# Patient Record
Sex: Male | Born: 1940 | Race: Black or African American | Hispanic: No | Smoking: Former smoker
Health system: Southern US, Community
[De-identification: ages and names within clinical notes are randomized; demographics above are authoritative.]

## PROBLEM LIST (undated history)

## (undated) DIAGNOSIS — I10 Essential (primary) hypertension: Secondary | ICD-10-CM

## (undated) DIAGNOSIS — I4891 Unspecified atrial fibrillation: Secondary | ICD-10-CM

## (undated) DIAGNOSIS — R918 Other nonspecific abnormal finding of lung field: Secondary | ICD-10-CM

## (undated) DIAGNOSIS — F0390 Unspecified dementia without behavioral disturbance: Secondary | ICD-10-CM

## (undated) DIAGNOSIS — E7800 Pure hypercholesterolemia, unspecified: Secondary | ICD-10-CM

## (undated) HISTORY — PX: JOINT REPLACEMENT: SHX530

## (undated) NOTE — *Deleted (*Deleted)
Writer attempted to do medical history for nursing . After a few questions patient became very loud & aggressive so Clinical research associate thanked him & exited the room RN caring for patient informed Patient Lines/Drains/Airways Status    Active Line/Drains/Airways    Name Placement date Placement time Site Days   Peripheral IV 05/08/20 Left;Posterior Forearm 05/08/20  2250  Forearm  7   Incision (Closed) 09/24/19 Abdomen Left;Anterior 09/24/19  2020   234   Pressure Injury 05/19/19 Hip Left;Lower Stage II -  Partial thickness loss of dermis presenting as a shallow open ulcer with a red, pink wound bed without slough. Stage II present with scab over it 05/19/19  0348   362   Pressure Injury 05/19/19 Hip Right;Left Stage I -  Intact skin with non-blanchable redness of a localized area usually over a bony prominence. 05/19/19  0355   362

---
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
## 2016-08-06 ENCOUNTER — Ambulatory Visit: Payer: Medicare HMO | Admitting: Podiatry

## 2016-09-03 ENCOUNTER — Ambulatory Visit: Payer: Medicare HMO | Admitting: Podiatry

## 2016-09-15 ENCOUNTER — Encounter: Payer: Self-pay | Admitting: Podiatry

## 2016-09-15 ENCOUNTER — Ambulatory Visit (INDEPENDENT_AMBULATORY_CARE_PROVIDER_SITE_OTHER): Payer: Medicare HMO | Admitting: Podiatry

## 2016-09-15 DIAGNOSIS — L603 Nail dystrophy: Secondary | ICD-10-CM

## 2016-09-15 DIAGNOSIS — B351 Tinea unguium: Secondary | ICD-10-CM

## 2016-09-15 DIAGNOSIS — L608 Other nail disorders: Secondary | ICD-10-CM | POA: Diagnosis not present

## 2016-09-15 DIAGNOSIS — M79609 Pain in unspecified limb: Secondary | ICD-10-CM

## 2016-09-15 NOTE — Progress Notes (Signed)
   SUBJECTIVE Patient  presents to office today complaining of elongated, thickened nails. Pain while ambulating in shoes. Patient is unable to trim their own nails.   OBJECTIVE General Patient is awake, alert, and oriented x 3 and in no acute distress. Derm Skin is dry and supple bilateral. Negative open lesions or macerations. Remaining integument unremarkable. Nails are tender, long, thickened and dystrophic with subungual debris, consistent with onychomycosis, 1-5 bilateral. No signs of infection noted. Vasc  DP and PT pedal pulses palpable bilaterally. Temperature gradient within normal limits.  Neuro Epicritic and protective threshold sensation diminished bilaterally.  Musculoskeletal Exam No symptomatic pedal deformities noted bilateral. Muscular strength within normal limits.  ASSESSMENT 1. Onychodystrophic nails 1-5 bilateral with hyperkeratosis of nails.  2. Onychomycosis of nail due to dermatophyte bilateral 3. Pain in foot bilateral  PLAN OF CARE 1. Patient evaluated today.  2. Instructed to maintain good pedal hygiene and foot care.  3. Mechanical debridement of nails 1-5 bilaterally performed using a nail nipper. Filed with dremel without incident.  4. Return to clinic in 3 mos.    Jeanetta Alonzo M. Clare Fennimore, DPM [facility]  Dr. Taliana Mersereau M. Mendell Bontempo, DPM    [address]                                        [place], [place] [address]                Office [phone]  Fax [phone]      

## 2016-10-29 ENCOUNTER — Ambulatory Visit (INDEPENDENT_AMBULATORY_CARE_PROVIDER_SITE_OTHER): Payer: Medicare HMO | Admitting: Physician Assistant

## 2016-10-29 ENCOUNTER — Encounter (INDEPENDENT_AMBULATORY_CARE_PROVIDER_SITE_OTHER): Payer: Self-pay | Admitting: Physician Assistant

## 2016-10-29 VITALS — BP 146/83 | HR 90 | Temp 98.2°F | Ht 75.0 in | Wt 169.4 lb

## 2016-10-29 DIAGNOSIS — Z23 Encounter for immunization: Secondary | ICD-10-CM | POA: Diagnosis not present

## 2016-10-29 DIAGNOSIS — I10 Essential (primary) hypertension: Secondary | ICD-10-CM

## 2016-10-29 DIAGNOSIS — M25552 Pain in left hip: Secondary | ICD-10-CM | POA: Diagnosis not present

## 2016-10-29 DIAGNOSIS — F17200 Nicotine dependence, unspecified, uncomplicated: Secondary | ICD-10-CM | POA: Diagnosis not present

## 2016-10-29 DIAGNOSIS — Z1211 Encounter for screening for malignant neoplasm of colon: Secondary | ICD-10-CM

## 2016-10-29 DIAGNOSIS — R69 Illness, unspecified: Secondary | ICD-10-CM | POA: Diagnosis not present

## 2016-10-29 MED ORDER — LOSARTAN POTASSIUM 50 MG PO TABS
50.0000 mg | ORAL_TABLET | Freq: Every day | ORAL | 3 refills | Status: DC
Start: 2016-10-29 — End: 2017-08-07

## 2016-10-29 MED ORDER — PNEUMOCOCCAL 13-VAL CONJ VACC IM SUSP: 0.5000 mL | INTRAMUSCULAR | 0 refills | Status: AC

## 2016-10-29 MED ORDER — HYDROCHLOROTHIAZIDE 12.5 MG PO TABS: 12.5000 mg | ORAL_TABLET | Freq: Every day | ORAL | 3 refills | Status: DC

## 2016-10-29 MED ORDER — MELOXICAM 7.5 MG PO TABS
7.5000 mg | ORAL_TABLET | Freq: Every day | ORAL | 0 refills | Status: DC
Start: 2016-10-29 — End: 2018-03-18

## 2016-10-29 NOTE — Patient Instructions (Signed)
Managing Your Hypertension Hypertension is commonly called high blood pressure. This is when the force of your blood pressing against the walls of your arteries is too strong. Arteries are blood vessels that carry blood from your heart throughout your body. Hypertension forces the heart to work harder to pump blood, and may cause the arteries to become narrow or stiff. Having untreated or uncontrolled hypertension can cause heart attack, stroke, kidney disease, and other problems. What are blood pressure readings? A blood pressure reading consists of a higher number over a lower number. Ideally, your blood pressure should be below 120/80. The first ("top") number is called the systolic pressure. It is a measure of the pressure in your arteries as your heart beats. The second ("bottom") number is called the diastolic pressure. It is a measure of the pressure in your arteries as the heart relaxes. What does my blood pressure reading mean? Blood pressure is classified into four stages. Based on your blood pressure reading, your health care provider may use the following stages to determine what type of treatment you need, if any. Systolic pressure and diastolic pressure are measured in a unit called mm Hg. Normal   Systolic pressure: below 120.  Diastolic pressure: below 80. Elevated   Systolic pressure: 120-129.  Diastolic pressure: below 80. Hypertension stage 1     Diastolic pressure: 80-89. Hypertension stage 2   Systolic pressure: 140 or above.  Diastolic pressure: 90 or above. What health risks are associated with hypertension? Managing your hypertension is an important responsibility. Uncontrolled hypertension can lead to:  A heart attack.  A stroke.  A weakened blood vessel (aneurysm).  Heart failure.  Kidney damage.  Eye damage.  Metabolic syndrome.  Memory and concentration problems. What changes can I make to manage my hypertension? Eating and drinking   Eat a  diet that is high in fiber and potassium, and low in salt (sodium), added sugar, and fat. An example eating plan is called the DASH (Dietary Approaches to Stop Hypertension) diet. To eat this way:  Eat plenty of fresh fruits and vegetables. Try to fill half of your plate at each meal with fruits and vegetables.  Eat whole grains, such as whole wheat pasta, brown rice, or whole grain bread. Fill about one quarter of your plate with whole grains.  Eat low-fat diary products.  Avoid fatty cuts of meat, processed or cured meats, and poultry with skin. Fill about one quarter of your plate with lean proteins such as fish, chicken without skin, beans, eggs, and tofu.  Avoid premade and processed foods. These tend to be higher in sodium, added sugar, and fat.     Lifestyle   Work with your health care provider to maintain a healthy body weight, or to lose weight. Ask what an ideal weight is for you.  Get at least 30 minutes of exercise that causes your heart to beat faster (aerobic exercise) most days of the week. Activities may include walking, swimming, or biking.       Monitoring   Monitor your blood pressure at home as told by your health care provider. Your personal target blood pressure may vary depending on your medical conditions, your age, and other factors.  Have your blood pressure checked regularly, as often as told by your health care provider. Working with your health care provider   Review all the medicines you take with your health care provider because there may be side effects or interactions.  Talk with your health   care provider about your diet, exercise habits, and other lifestyle factors that may be contributing to hypertension.  Visit your health care provider regularly. Your health care provider can help you create and adjust your plan for managing hypertension. Will I need medicine to control my blood pressure? Your health care provider may prescribe medicine if  lifestyle changes are not enough to get your blood pressure under control, and if:  Your systolic blood pressure is 130 or higher.  Your diastolic blood pressure is 80 or higher. Take medicines only as told by your health care provider. Follow the directions carefully. Blood pressure medicines must be taken as prescribed. The medicine does not work as well when you skip doses. Skipping doses also puts you at risk for problems. Contact a health care provider if:  You think you are having a reaction to medicines you have taken.  You have repeated (recurrent) headaches.  You feel dizzy.  You have swelling in your ankles.  You have trouble with your vision. Get help right away if:  You develop a severe headache or confusion.  You have unusual weakness or numbness, or you feel faint.  You have severe pain in your chest or abdomen.  You vomit repeatedly.  You have trouble breathing. Summary  Hypertension is when the force of blood pumping through your arteries is too strong. If this condition is not controlled, it may put you at risk for serious complications.  Your personal target blood pressure may vary depending on your medical conditions, your age, and other factors. For most people, a normal blood pressure is less than 120/80.  Hypertension is managed by lifestyle changes, medicines, or both. Lifestyle changes include weight loss, eating a healthy, low-sodium diet, exercising more, and limiting alcohol. This information is not intended to replace advice given to you by your health care provider. Make sure you discuss any questions you have with your health care provider. Document Released: 02/25/2012 Document Revised: 04/30/2016 Document Reviewed: 04/30/2016 Elsevier Interactive Patient Education  2017 Elsevier Inc.  

## 2016-10-29 NOTE — Progress Notes (Signed)
Subjective:  Patient ID: Richard [name], male    DOB: [birth year]  Age: 76 y.o. MRN: [id number]  CC: physical, left hip pain, HTN  HPI Finas Hellwig is a 76 y.o. male with a PMH of HTN and left hip pain presents for a physical. HTN is not controlled due to not having medications since his recent move here from [place]. Left hip pain is chronic and has had surgical procedure done but does not exactly what. He can ambulate but pain is present.     Smokes half a pack a day "sometimes". Says he is not interested in pharmacotherapy for smoking at this time.   ROS Review of Systems  Constitutional: Negative for chills, fever and malaise/fatigue.  Eyes: Negative for blurred vision.  Respiratory: Negative for shortness of breath.   Cardiovascular: Negative for chest pain and palpitations.  Gastrointestinal: Negative for abdominal pain and nausea.  Genitourinary: Negative for dysuria and hematuria.  Musculoskeletal: Positive for joint pain. Negative for myalgias.  Skin: Negative for rash.  Neurological: Negative for tingling and headaches.  Psychiatric/Behavioral: Negative for depression. The patient is not nervous/anxious.     Objective:  BP (!) 146/83   Pulse 90   Temp 98.2 F (36.8 C) (Oral)   Ht '6\' 3"'$  (1.905 m)   Wt 169 lb 6.4 oz (76.8 kg)   SpO2 95%   BMI 21.17 kg/m   BP/Weight 01/24/9146  Systolic BP 829  Diastolic BP 83  Wt. (Lbs) 169.4  BMI 21.17      Physical Exam  Constitutional: He is oriented to person, place, and time.  Well developed, thin, NAD, polite  HENT:  Head: Normocephalic and atraumatic.  Eyes: No scleral icterus.  Neck: Normal range of motion. Neck supple. No thyromegaly present.  Cardiovascular: Normal rate, regular rhythm and normal heart sounds.   Pulmonary/Chest: Effort normal and breath sounds normal.  Musculoskeletal: He exhibits no edema.  Neurological: He is alert and oriented to person, place, and time. No cranial nerve deficit. Coordination normal.   Skin: Skin is warm and dry. No rash noted. No erythema. No pallor.  Psychiatric: He has a normal mood and affect. His behavior is normal. Thought content normal.  Vitals reviewed.    Assessment & Plan:   1. Hypertension, unspecified type - CBC with Differential - Comprehensive metabolic panel - Lipid Panel  2. Left hip pain - XR HIP UNILAT W OR W/O PELVIS 2-3 VIEWS LEFT  3. Need for Tdap vaccination - TDAP administered in clinic today  4. Need for prophylactic vaccination against Streptococcus pneumoniae (pneumococcus) - Prevnar  5. Encounter for screening colonoscopy - Ambulatory referral to Gastroenterology  6. Tobacco use disorder - Declines pharmacotherapy at this time.   Meds ordered this encounter  Medications  . hydrochlorothiazide (HYDRODIURIL) 12.5 MG tablet    Sig: Take 1 tablet (12.5 mg total) by mouth daily.    Dispense:  90 tablet    Refill:  3    Order Specific Question:   Supervising Provider    Answer:   Tresa Garter [id number]  . losartan (COZAAR) 50 MG tablet    Sig: Take 1 tablet (50 mg total) by mouth daily.    Dispense:  90 tablet    Refill:  3    Order Specific Question:   Supervising Provider    Answer:   Tresa Garter [id number]  . meloxicam (MOBIC) 7.5 MG tablet    Sig: Take 1 tablet (7.5 mg total) by mouth daily.  Dispense:  30 tablet    Refill:  0    Order Specific Question:   Supervising Provider    Answer:   Tresa Garter [id number]    Follow-up: Return in about 4 weeks (around 11/26/2016) for HTN, left hip pain.   Clent Demark PA

## 2016-10-30 ENCOUNTER — Other Ambulatory Visit (INDEPENDENT_AMBULATORY_CARE_PROVIDER_SITE_OTHER): Payer: Self-pay | Admitting: Physician Assistant

## 2016-10-30 DIAGNOSIS — E785 Hyperlipidemia, unspecified: Secondary | ICD-10-CM

## 2016-10-30 LAB — CBC WITH DIFFERENTIAL/PLATELET
Basophils Absolute: 0 10*3/uL (ref 0.0–0.2)
Basos: 1 %
EOS (ABSOLUTE): 0 10*3/uL (ref 0.0–0.4)
Eos: 0 %
Hematocrit: 44.6 % (ref 37.5–51.0)
Hemoglobin: 14.5 g/dL (ref 13.0–17.7)
Immature Grans (Abs): 0 10*3/uL (ref 0.0–0.1)
Immature Granulocytes: 0 %
Lymphocytes Absolute: 3.1 10*3/uL (ref 0.7–3.1)
Lymphs: 42 %
MCH: 21.9 pg — ABNORMAL LOW (ref 26.6–33.0)
MCHC: 32.5 g/dL (ref 31.5–35.7)
MCV: 67 fL — ABNORMAL LOW (ref 79–97)
Monocytes Absolute: 0.7 10*3/uL (ref 0.1–0.9)
Monocytes: 9 %
Neutrophils Absolute: 3.5 10*3/uL (ref 1.4–7.0)
Neutrophils: 48 %
Platelets: 278 10*3/uL (ref 150–379)
RBC: 6.62 x10E6/uL — ABNORMAL HIGH (ref 4.14–5.80)
RDW: 19.9 % — ABNORMAL HIGH (ref 12.3–15.4)
WBC: 7.3 10*3/uL (ref 3.4–10.8)

## 2016-10-30 LAB — COMPREHENSIVE METABOLIC PANEL
ALT: 10 IU/L (ref 0–44)
AST: 17 IU/L (ref 0–40)
Albumin/Globulin Ratio: 1.4 (ref 1.2–2.2)
Albumin: 4.5 g/dL (ref 3.5–4.8)
Alkaline Phosphatase: 77 IU/L (ref 39–117)
BUN/Creatinine Ratio: 13 (ref 10–24)
BUN: 12 mg/dL (ref 8–27)
Bilirubin Total: 0.4 mg/dL (ref 0.0–1.2)
CO2: 25 mmol/L (ref 18–29)
Calcium: 10.4 mg/dL — ABNORMAL HIGH (ref 8.6–10.2)
Chloride: 106 mmol/L (ref 96–106)
Creatinine, Ser: 0.93 mg/dL (ref 0.76–1.27)
GFR calc Af Amer: 93 mL/min/{1.73_m2} (ref >59)
GFR calc non Af Amer: 81 mL/min/{1.73_m2} (ref >59)
Globulin, Total: 3.2 g/dL (ref 1.5–4.5)
Glucose: 107 mg/dL — ABNORMAL HIGH (ref 65–99)
Potassium: 4.6 mmol/L (ref 3.5–5.2)
Sodium: 145 mmol/L — ABNORMAL HIGH (ref 134–144)
Total Protein: 7.7 g/dL (ref 6.0–8.5)

## 2016-10-30 LAB — LIPID PANEL
Chol/HDL Ratio: 4.1 ratio (ref 0.0–5.0)
Cholesterol, Total: 252 mg/dL — ABNORMAL HIGH (ref 100–199)
HDL: 62 mg/dL (ref >39)
LDL Calculated: 152 mg/dL — ABNORMAL HIGH (ref 0–99)
Triglycerides: 188 mg/dL — ABNORMAL HIGH (ref 0–149)
VLDL Cholesterol Cal: 38 mg/dL (ref 5–40)

## 2016-10-30 MED ORDER — ATORVASTATIN CALCIUM 20 MG PO TABS: 20.0000 mg | ORAL_TABLET | Freq: Every evening | ORAL | 1 refills | Status: DC

## 2016-10-30 NOTE — Progress Notes (Signed)
Elevated LDL.

## 2016-11-17 ENCOUNTER — Ambulatory Visit: Payer: Medicare HMO | Admitting: Podiatry

## 2016-11-26 ENCOUNTER — Ambulatory Visit (HOSPITAL_COMMUNITY)
Admission: EM | Admit: 2016-11-26 | Discharge: 2016-11-26 | Disposition: A | Discharge status: Home / Self Care | Payer: Medicare HMO | Attending: Family Medicine | Admitting: Family Medicine

## 2016-11-26 ENCOUNTER — Ambulatory Visit (INDEPENDENT_AMBULATORY_CARE_PROVIDER_SITE_OTHER): Payer: Medicare HMO | Admitting: Physician Assistant

## 2016-11-26 ENCOUNTER — Encounter (HOSPITAL_COMMUNITY): Payer: Self-pay | Admitting: Emergency Medicine

## 2016-11-26 DIAGNOSIS — M26622 Arthralgia of left temporomandibular joint: Secondary | ICD-10-CM | POA: Diagnosis not present

## 2016-11-26 DIAGNOSIS — R6884 Jaw pain: Secondary | ICD-10-CM

## 2016-11-26 HISTORY — DX: Essential (primary) hypertension: I10

## 2016-11-26 HISTORY — DX: Pure hypercholesterolemia, unspecified: E78.00

## 2016-11-26 MED ORDER — ACETAMINOPHEN 500 MG PO TABS: 1000.0000 mg | ORAL_TABLET | Freq: Three times a day (TID) | ORAL | 0 refills | Status: DC | PRN

## 2016-11-26 NOTE — ED Triage Notes (Signed)
Pt reports left sided jaw pain that started three days ago.  Pt has no teeth.  Pt's daughter gave him some Tylenol yesterday with very little relief.

## 2016-11-26 NOTE — Discharge Instructions (Signed)
Please return to Urgent Care if worsening or new and concerning symptoms develop such as increasing pain, fever, difficulty swallowing.

## 2016-11-27 NOTE — ED Provider Notes (Signed)
[facility]    CSN: [id number] Arrival date & time: 11/26/16  1035     History   Chief Complaint Chief Complaint  Patient presents with  . Jaw Pain    left    HPI Richard [name] is a 76 y.o. male. Presents with gradual onset of L-sided "jaw pain". No injury. Able to eat and drink normally. Pain slightly worse when opening mouth fully. Afebrile. No mouth lesions or sores. No neck pain or swelling. No teeth. Has not worn dentures in over two years. Tylenol without much help. Single dose yesterday. Able to sleep normally.  HPI  Past Medical History:  Diagnosis Date  . High cholesterol   . Hypertension     There are no active problems to display for this patient.   Past Surgical History:  Procedure Laterality Date  . JOINT REPLACEMENT        Home Medications    Prior to Admission medications   Medication Sig Start Date End Date Taking? Authorizing Provider  ASPIRIN 81 PO Take by mouth.   Yes [provider]  atorvastatin (LIPITOR) 20 MG tablet Take 1 tablet (20 mg total) by mouth every evening. 10/30/16  Yes Clent Demark, PA-C  hydrochlorothiazide (HYDRODIURIL) 12.5 MG tablet Take 1 tablet (12.5 mg total) by mouth daily. 10/29/16  Yes Clent Demark, PA-C  losartan (COZAAR) 50 MG tablet Take 1 tablet (50 mg total) by mouth daily. 10/29/16  Yes Clent Demark, PA-C  meloxicam (MOBIC) 7.5 MG tablet Take 1 tablet (7.5 mg total) by mouth daily. 10/29/16  Yes Clent Demark, PA-C  acetaminophen (TYLENOL) 500 MG tablet Take 2 tablets (1,000 mg total) by mouth every 8 (eight) hours as needed for mild pain. 11/26/16   Vanessa Kick, MD    Family History History reviewed. No pertinent family history.  Social History Social History  Substance Use Topics  . Smoking status: Current Every Day Smoker    Packs/day: 0.25    Types: Cigarettes  . Smokeless tobacco: Never Used  . Alcohol use Yes     Comment: occasional     Allergies   Patient  has no known allergies.   Review of Systems Review of Systems  Constitutional: Negative for activity change, appetite change, chills, fatigue, fever and unexpected weight change.  HENT: Negative for ear pain, facial swelling, sinus pain and sinus pressure.   Eyes: Negative for pain.  Musculoskeletal: Negative for arthralgias and myalgias.  Skin: Negative for color change and rash.  No neck pain.  Physical Exam Triage Vital Signs ED Triage Vitals  Enc Vitals Group     BP 11/26/16 1111 119/86     Pulse Rate 11/26/16 1111 70     Resp --      Temp 11/26/16 1111 98.2 F (36.8 C)     Temp Source 11/26/16 1111 Oral     SpO2 11/26/16 1111 98 %     Weight --      Height --      Head Circumference --      Peak Flow --      Pain Score 11/26/16 1112 5     Pain Loc --      Pain Edu? --      Excl. in [place]? --    No data found.   Updated Vital Signs BP 119/86 (BP Location: Right Arm)   Pulse 70   Temp 98.2 F (36.8 C) (Oral)   SpO2 98%  Physical Exam  Constitutional: He appears well-developed and well-nourished. No distress.  HENT:  Head: Normocephalic and atraumatic.  Skin: He is not diaphoretic.   Very tender over L TMJ. No swelling. No oral abscess or infection appreciated. All teeth missing. Neck with FROM and supple. No LAD.  UC Treatments / Results  Labs (all labs ordered are listed, but only abnormal results are displayed) Labs Reviewed - No data to display  EKG  EKG Interpretation None       Radiology No results found.  Procedures Procedures (including critical care time)  Medications Ordered in UC Medications - No data to display   Initial Impression / Assessment and Plan / UC Course  I have reviewed the triage vital signs and the nursing notes.  Pertinent labs & imaging results that were available during my care of the patient were reviewed by me and considered in my medical decision making (see chart for details).       Final Clinical  Impressions(s) / UC Diagnoses   Final diagnoses:  Arthralgia of left temporomandibular joint   Continue regular Tylenol in addition to the Mobic he is taking. If not improving in 48 hours or if worsening he is instructed to RTC.  New Prescriptions Discharge Medication List as of 11/26/2016 11:56 AM    START taking these medications   Details  acetaminophen (TYLENOL) 500 MG tablet Take 2 tablets (1,000 mg total) by mouth every 8 (eight) hours as needed for mild pain., Starting Wed 11/26/2016, Normal         Vanessa Kick, MD 11/27/16 1008

## 2016-11-28 DIAGNOSIS — Z6823 Body mass index (BMI) 23.0-23.9, adult: Secondary | ICD-10-CM | POA: Diagnosis not present

## 2016-11-28 DIAGNOSIS — I10 Essential (primary) hypertension: Secondary | ICD-10-CM | POA: Diagnosis not present

## 2016-11-28 DIAGNOSIS — Z79899 Other long term (current) drug therapy: Secondary | ICD-10-CM | POA: Diagnosis not present

## 2016-11-28 DIAGNOSIS — Z7982 Long term (current) use of aspirin: Secondary | ICD-10-CM | POA: Diagnosis not present

## 2016-11-28 DIAGNOSIS — M13152 Monoarthritis, not elsewhere classified, left hip: Secondary | ICD-10-CM | POA: Diagnosis not present

## 2016-11-28 DIAGNOSIS — Z0000 Encounter for general adult medical examination without abnormal findings: Secondary | ICD-10-CM | POA: Diagnosis not present

## 2016-11-28 DIAGNOSIS — K08109 Complete loss of teeth, unspecified cause, unspecified class: Secondary | ICD-10-CM | POA: Diagnosis not present

## 2016-11-28 DIAGNOSIS — E785 Hyperlipidemia, unspecified: Secondary | ICD-10-CM | POA: Diagnosis not present

## 2016-11-28 DIAGNOSIS — R69 Illness, unspecified: Secondary | ICD-10-CM | POA: Diagnosis not present

## 2016-11-28 DIAGNOSIS — G3184 Mild cognitive impairment, so stated: Secondary | ICD-10-CM | POA: Diagnosis not present

## 2017-08-07 ENCOUNTER — Other Ambulatory Visit (INDEPENDENT_AMBULATORY_CARE_PROVIDER_SITE_OTHER): Payer: Self-pay | Admitting: Physician Assistant

## 2017-08-17 NOTE — Telephone Encounter (Signed)
FWD to PCP. Tempestt S Roberts, CMA  

## 2018-03-18 ENCOUNTER — Ambulatory Visit (INDEPENDENT_AMBULATORY_CARE_PROVIDER_SITE_OTHER): Payer: Medicare HMO | Admitting: Physician Assistant

## 2018-03-18 ENCOUNTER — Encounter (INDEPENDENT_AMBULATORY_CARE_PROVIDER_SITE_OTHER): Payer: Self-pay | Admitting: Physician Assistant

## 2018-03-18 ENCOUNTER — Other Ambulatory Visit: Payer: Self-pay

## 2018-03-18 VITALS — BP 151/91 | HR 86 | Temp 98.0°F | Ht 75.0 in | Wt 173.8 lb

## 2018-03-18 DIAGNOSIS — M25511 Pain in right shoulder: Secondary | ICD-10-CM

## 2018-03-18 DIAGNOSIS — I10 Essential (primary) hypertension: Secondary | ICD-10-CM | POA: Diagnosis not present

## 2018-03-18 DIAGNOSIS — E785 Hyperlipidemia, unspecified: Secondary | ICD-10-CM | POA: Diagnosis not present

## 2018-03-18 DIAGNOSIS — Z23 Encounter for immunization: Secondary | ICD-10-CM

## 2018-03-18 DIAGNOSIS — G8929 Other chronic pain: Secondary | ICD-10-CM

## 2018-03-18 DIAGNOSIS — M25512 Pain in left shoulder: Secondary | ICD-10-CM | POA: Diagnosis not present

## 2018-03-18 MED ORDER — HYDROCHLOROTHIAZIDE 12.5 MG PO TABS: 12.5000 mg | ORAL_TABLET | Freq: Every day | ORAL | 3 refills | Status: DC

## 2018-03-18 MED ORDER — ATORVASTATIN CALCIUM 20 MG PO TABS: 20.0000 mg | ORAL_TABLET | Freq: Every evening | ORAL | 1 refills | Status: DC

## 2018-03-18 MED ORDER — MELOXICAM 7.5 MG PO TABS: 7.5000 mg | ORAL_TABLET | Freq: Every day | ORAL | 0 refills | Status: DC

## 2018-03-18 MED ORDER — LOSARTAN POTASSIUM 50 MG PO TABS: 50.0000 mg | ORAL_TABLET | Freq: Every day | ORAL | 3 refills | Status: DC

## 2018-03-18 MED ORDER — ASPIRIN 81 MG PO TBEC: 81.0000 mg | DELAYED_RELEASE_TABLET | Freq: Every day | ORAL | 3 refills | Status: DC

## 2018-03-18 NOTE — Progress Notes (Signed)
Subjective:  Patient ID: Richard [name], male    DOB: February 26, 1941  Age: 77 y.o. MRN: [id number]  CC: HTN and med refill  HPI Richard [name] is a 77 y.o. male with a PMH of HTN, HLD, and left hip pain presents to f/u on HTN. Last BP 146/83 mmHg 18 weeks ago. Had not been taking anti-hypertensives due to moving from [place]. HCTZ 12.5 mg and Losartan 50 mg were refilled. However, his pharmacy here in [place] closed and he was not able to fill his medications. BP 151/91 mmHg today. Does not endorse CP, palpitations, SOB, HA, tingling, numbness, abdominal pain, f/c/n/v, rash, swelling, or GI/GU sxs.     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 Feels as though he has to move and stretch his arms to relieve his shoulder pain/stiffness. Does not endorse any other associated symptoms. Daughter accompanies patient and states he is very inactive and just "sits there" all day. She believes he has to go out and be more active to reduce his pain and stiffness.     Outpatient Medications Prior to Visit  Medication Sig Dispense Refill  . acetaminophen (TYLENOL) 500 MG tablet Take 2 tablets (1,000 mg total) by mouth every 8 (eight) hours as needed for mild pain. (Patient not taking: Reported on 03/18/2018) 20 tablet 0  . ASPIRIN 81 PO Take by mouth.    Marland Kitchen atorvastatin (LIPITOR) 20 MG tablet Take 1 tablet (20 mg total) by mouth every evening. (Patient not taking: Reported on 03/18/2018) 90 tablet 1  . hydrochlorothiazide (HYDRODIURIL) 12.5 MG tablet Take 1 tablet (12.5 mg total) by mouth daily. (Patient not taking: Reported on 03/18/2018) 90 tablet 3  . losartan (COZAAR) 50 MG tablet Take 1 tablet (50 mg total) by mouth daily. (Patient not taking: Reported on 03/18/2018) 90 tablet 3  . meloxicam (MOBIC) 7.5 MG tablet Take 1 tablet (7.5 mg total) by mouth daily. (Patient not taking: Reported on 03/18/2018) 30 tablet 0   No facility-administered medications prior to visit.       ROS Review of Systems  Constitutional: Negative for chills, fever and malaise/fatigue.  Eyes: Negative for blurred vision.  Respiratory: Negative for shortness of breath.   Cardiovascular: Negative for chest pain and palpitations.  Gastrointestinal: Negative for abdominal pain and nausea.  Genitourinary: Negative for dysuria and hematuria.  Musculoskeletal: Positive for joint pain. Negative for myalgias.  Skin: Negative for rash.  Neurological: Negative for tingling and headaches.  Psychiatric/Behavioral: Negative for depression. The patient is not nervous/anxious.     Objective:  BP (!) 151/91 (BP Location: Right Arm, Patient Position: Sitting, Cuff Size: Normal)   Pulse 86   Temp 98 F (36.7 C) (Oral)   Ht 6\' 3"  (1.905 m)   Wt 173 lb 12.8 oz (78.8 kg)   SpO2 96%   BMI 21.72 kg/m   BP/Weight 03/18/2018 11/26/2016 09/22/8117  Systolic BP 147 829 562  Diastolic BP 91 86 83  Wt. (Lbs) 173.8 - 169.4  BMI 21.72 - 21.17      Physical Exam  Constitutional: He is oriented to person, place, and time.  Well developed, well nourished, NAD, polite  HENT:  Head: Normocephalic and atraumatic.  Eyes: No scleral icterus.  Neck: Normal range of motion. Neck supple. No thyromegaly present.  Cardiovascular: Normal rate, regular rhythm and normal heart sounds.  Pulmonary/Chest: Effort normal and breath sounds normal.  Abdominal: Soft. Bowel sounds are normal. There is no tenderness.  Musculoskeletal:  He exhibits no edema.  Bilateral shoulder with mildly limited flexion and abduction. Right shoulder with mildly limited internal rotation compared to left shoulder. AC shear testing negative bilaterally.Negative drop arm test bilaterally.  Neurological: He is alert and oriented to person, place, and time.  Bilateral shoulder with strength 5/5  Skin: Skin is warm and dry. No rash noted. No erythema. No pallor.  Psychiatric: He has a normal mood and affect. His behavior is normal.  Thought content normal.  Vitals reviewed.    Assessment & Plan:   1. Hypertension, unspecified type - Refill losartan (COZAAR) 50 MG tablet; Take 1 tablet (50 mg total) by mouth daily.  Dispense: 90 tablet; Refill: 3 - Refill hydrochlorothiazide (HYDRODIURIL) 12.5 MG tablet; Take 1 tablet (12.5 mg total) by mouth daily.  Dispense: 90 tablet; Refill: 3 - Refill aspirin (ASPIRIN 81) 81 MG EC tablet; Take 1 tablet (81 mg total) by mouth daily.  Dispense: 90 tablet; Refill: 3  2. Dyslipidemia - Refill atorvastatin (LIPITOR) 20 MG tablet; Take 1 tablet (20 mg total) by mouth every evening.  Dispense: 90 tablet; Refill: 1  3. Chronic pain of both shoulders - Ambulatory referral to Physical Therapy - Refill meloxicam (MOBIC) 7.5 MG tablet; Take 1 tablet (7.5 mg total) by mouth daily.  Dispense: 30 tablet; Refill: 0  4. Need for prophylactic vaccination and inoculation against influenza - Flu Vaccine QUAD 6+ mos PF IM (Fluarix Quad PF)   Meds ordered this encounter  Medications  . DISCONTD: losartan (COZAAR) 50 MG tablet    Sig: Take 1 tablet (50 mg total) by mouth daily.    Dispense:  90 tablet    Refill:  3    This prescription was filled on 08/07/2017. Any refills authorized will be placed on file.    Order Specific Question:   Supervising Provider    Answer:   Charlott Rakes [[id number]]  . DISCONTD: hydrochlorothiazide (HYDRODIURIL) 12.5 MG tablet    Sig: Take 1 tablet (12.5 mg total) by mouth daily.    Dispense:  90 tablet    Refill:  3    This prescription was filled on 08/07/2017. Any refills authorized will be placed on file.    Order Specific Question:   Supervising Provider    Answer:   Charlott Rakes [[id number]]  . DISCONTD: atorvastatin (LIPITOR) 20 MG tablet    Sig: Take 1 tablet (20 mg total) by mouth every evening.    Dispense:  90 tablet    Refill:  1    Order Specific Question:   Supervising Provider    Answer:   Charlott Rakes [[id number]]  . DISCONTD: aspirin (ASPIRIN 81) 81  MG EC tablet    Sig: Take 1 tablet (81 mg total) by mouth daily.    Dispense:  90 tablet    Refill:  3    Order Specific Question:   Supervising Provider    Answer:   Charlott Rakes [[id number]]  . DISCONTD: meloxicam (MOBIC) 7.5 MG tablet    Sig: Take 1 tablet (7.5 mg total) by mouth daily.    Dispense:  30 tablet    Refill:  0    Order Specific Question:   Supervising Provider    Answer:   Charlott Rakes [[id number]]  . meloxicam (MOBIC) 7.5 MG tablet    Sig: Take 1 tablet (7.5 mg total) by mouth daily.    Dispense:  30 tablet    Refill:  0    Order Specific Question:   Supervising  Provider    Answer:   Charlott Rakes [[id number]]  . losartan (COZAAR) 50 MG tablet    Sig: Take 1 tablet (50 mg total) by mouth daily.    Dispense:  90 tablet    Refill:  3    This prescription was filled on 08/07/2017. Any refills authorized will be placed on file.    Order Specific Question:   Supervising Provider    Answer:   Charlott Rakes [[id number]]  . hydrochlorothiazide (HYDRODIURIL) 12.5 MG tablet    Sig: Take 1 tablet (12.5 mg total) by mouth daily.    Dispense:  90 tablet    Refill:  3    This prescription was filled on 08/07/2017. Any refills authorized will be placed on file.    Order Specific Question:   Supervising Provider    Answer:   Charlott Rakes [[id number]]  . atorvastatin (LIPITOR) 20 MG tablet    Sig: Take 1 tablet (20 mg total) by mouth every evening.    Dispense:  90 tablet    Refill:  1    Order Specific Question:   Supervising Provider    Answer:   Charlott Rakes [[id number]]  . aspirin (ASPIRIN 81) 81 MG EC tablet    Sig: Take 1 tablet (81 mg total) by mouth daily.    Dispense:  90 tablet    Refill:  3    Order Specific Question:   Supervising Provider    Answer:   Charlott Rakes [[id number]]    Follow-up: Return in about 8 weeks (around 05/13/2018) for HTN and shoulder pain.   Clent Demark PA

## 2018-03-18 NOTE — Patient Instructions (Signed)

## 2018-04-15 ENCOUNTER — Ambulatory Visit (INDEPENDENT_AMBULATORY_CARE_PROVIDER_SITE_OTHER): Payer: Medicare HMO | Admitting: Physician Assistant

## 2018-05-17 ENCOUNTER — Ambulatory Visit (INDEPENDENT_AMBULATORY_CARE_PROVIDER_SITE_OTHER): Payer: Medicare HMO | Admitting: Physician Assistant

## 2018-08-31 ENCOUNTER — Other Ambulatory Visit (INDEPENDENT_AMBULATORY_CARE_PROVIDER_SITE_OTHER): Payer: Self-pay | Admitting: Primary Care

## 2018-08-31 ENCOUNTER — Telehealth (INDEPENDENT_AMBULATORY_CARE_PROVIDER_SITE_OTHER): Payer: Self-pay | Admitting: Physician Assistant

## 2018-08-31 DIAGNOSIS — I10 Essential (primary) hypertension: Secondary | ICD-10-CM

## 2018-08-31 DIAGNOSIS — E785 Hyperlipidemia, unspecified: Secondary | ICD-10-CM

## 2018-08-31 MED ORDER — ATORVASTATIN CALCIUM 20 MG PO TABS: 20.0000 mg | ORAL_TABLET | Freq: Every evening | ORAL | 0 refills | Status: DC

## 2018-08-31 MED ORDER — LOSARTAN POTASSIUM 50 MG PO TABS: 50.0000 mg | ORAL_TABLET | Freq: Every day | ORAL | 0 refills | Status: DC

## 2018-08-31 MED ORDER — HYDROCHLOROTHIAZIDE 12.5 MG PO TABS: 12.5000 mg | ORAL_TABLET | Freq: Every day | ORAL | 0 refills | Status: DC

## 2018-08-31 NOTE — Telephone Encounter (Signed)
Pt will need an OV for further RF's.

## 2018-08-31 NOTE — Telephone Encounter (Signed)
Patient daughter called to request med refill for   atorvastatin (LIPITOR) 20 MG tablet  hydrochlorothiazide (HYDRODIURIL) 12.5 MG tablet   losartan (COZAAR) 50 MG tablet  Patient would like for Rx to go [facility] on [address]  Phone: [phone]  Fax: [phone]   Please advise [phone]   Thank you Emmit Pomfret

## 2018-08-31 NOTE — Telephone Encounter (Signed)
Patient is aware. appt on march 24 @ 2:10 pm

## 2018-09-03 ENCOUNTER — Telehealth (INDEPENDENT_AMBULATORY_CARE_PROVIDER_SITE_OTHER): Payer: Self-pay | Admitting: Physician Assistant

## 2018-09-03 NOTE — Telephone Encounter (Signed)
Patient daughter called to inform that Rx was not sent to [facility].  Please advise [phone]  Thank you Emmit Pomfret

## 2018-09-06 ENCOUNTER — Other Ambulatory Visit (INDEPENDENT_AMBULATORY_CARE_PROVIDER_SITE_OTHER): Payer: Self-pay

## 2018-09-06 ENCOUNTER — Ambulatory Visit (INDEPENDENT_AMBULATORY_CARE_PROVIDER_SITE_OTHER): Payer: Medicare HMO | Admitting: Primary Care

## 2018-09-06 DIAGNOSIS — I10 Essential (primary) hypertension: Secondary | ICD-10-CM

## 2018-09-06 DIAGNOSIS — E785 Hyperlipidemia, unspecified: Secondary | ICD-10-CM

## 2018-09-06 MED ORDER — HYDROCHLOROTHIAZIDE 12.5 MG PO TABS: 12.5000 mg | ORAL_TABLET | Freq: Every day | ORAL | 0 refills | Status: DC

## 2018-09-06 MED ORDER — LOSARTAN POTASSIUM 50 MG PO TABS: 50.0000 mg | ORAL_TABLET | Freq: Every day | ORAL | 0 refills | Status: DC

## 2018-09-06 MED ORDER — ATORVASTATIN CALCIUM 20 MG PO TABS: 20.0000 mg | ORAL_TABLET | Freq: Every evening | ORAL | 0 refills | Status: DC

## 2018-09-06 NOTE — Telephone Encounter (Signed)
Medications has been sent to correct pharmacy. Nat Christen, CMA

## 2018-09-06 NOTE — Telephone Encounter (Signed)
Medications were sent to the wrong pharmacy. All medications have been sent to correct patient pharmacy. Patient was to have an appointment for refills but due to COVID-19 restrictions patient appointment was cancelled.  Nat Christen, CMA

## 2018-09-07 ENCOUNTER — Ambulatory Visit (INDEPENDENT_AMBULATORY_CARE_PROVIDER_SITE_OTHER): Payer: Medicare HMO | Admitting: Primary Care

## 2018-10-11 ENCOUNTER — Other Ambulatory Visit (INDEPENDENT_AMBULATORY_CARE_PROVIDER_SITE_OTHER): Payer: Self-pay | Admitting: Primary Care

## 2018-10-11 DIAGNOSIS — I10 Essential (primary) hypertension: Secondary | ICD-10-CM

## 2018-11-10 DIAGNOSIS — Z791 Long term (current) use of non-steroidal anti-inflammatories (NSAID): Secondary | ICD-10-CM | POA: Diagnosis not present

## 2018-11-10 DIAGNOSIS — M1990 Unspecified osteoarthritis, unspecified site: Secondary | ICD-10-CM | POA: Diagnosis not present

## 2018-11-10 DIAGNOSIS — I2510 Atherosclerotic heart disease of native coronary artery without angina pectoris: Secondary | ICD-10-CM | POA: Diagnosis not present

## 2018-11-10 DIAGNOSIS — G3184 Mild cognitive impairment, so stated: Secondary | ICD-10-CM | POA: Diagnosis not present

## 2018-11-10 DIAGNOSIS — E785 Hyperlipidemia, unspecified: Secondary | ICD-10-CM | POA: Diagnosis not present

## 2018-11-10 DIAGNOSIS — R69 Illness, unspecified: Secondary | ICD-10-CM | POA: Diagnosis not present

## 2018-11-10 DIAGNOSIS — K08109 Complete loss of teeth, unspecified cause, unspecified class: Secondary | ICD-10-CM | POA: Diagnosis not present

## 2018-11-10 DIAGNOSIS — I10 Essential (primary) hypertension: Secondary | ICD-10-CM | POA: Diagnosis not present

## 2018-11-10 DIAGNOSIS — G8929 Other chronic pain: Secondary | ICD-10-CM | POA: Diagnosis not present

## 2018-11-10 DIAGNOSIS — Z7982 Long term (current) use of aspirin: Secondary | ICD-10-CM | POA: Diagnosis not present

## 2018-12-14 ENCOUNTER — Emergency Department (HOSPITAL_COMMUNITY): Payer: Medicare HMO

## 2018-12-14 ENCOUNTER — Other Ambulatory Visit: Payer: Self-pay

## 2018-12-14 ENCOUNTER — Inpatient Hospital Stay (HOSPITAL_COMMUNITY)
Admission: EM | Admit: 2018-12-14 | Discharge: 2018-12-17 | DRG: 166 | Disposition: A | Discharge status: Home Health | Payer: Medicare HMO | Attending: Internal Medicine | Admitting: Internal Medicine

## 2018-12-14 DIAGNOSIS — J188 Other pneumonia, unspecified organism: Secondary | ICD-10-CM | POA: Diagnosis present

## 2018-12-14 DIAGNOSIS — J189 Pneumonia, unspecified organism: Secondary | ICD-10-CM | POA: Diagnosis not present

## 2018-12-14 DIAGNOSIS — Z801 Family history of malignant neoplasm of trachea, bronchus and lung: Secondary | ICD-10-CM

## 2018-12-14 DIAGNOSIS — F0390 Unspecified dementia without behavioral disturbance: Secondary | ICD-10-CM | POA: Diagnosis present

## 2018-12-14 DIAGNOSIS — R1900 Intra-abdominal and pelvic swelling, mass and lump, unspecified site: Secondary | ICD-10-CM

## 2018-12-14 DIAGNOSIS — J90 Pleural effusion, not elsewhere classified: Secondary | ICD-10-CM | POA: Diagnosis present

## 2018-12-14 DIAGNOSIS — I2510 Atherosclerotic heart disease of native coronary artery without angina pectoris: Secondary | ICD-10-CM | POA: Diagnosis not present

## 2018-12-14 DIAGNOSIS — Z419 Encounter for procedure for purposes other than remedying health state, unspecified: Secondary | ICD-10-CM

## 2018-12-14 DIAGNOSIS — J439 Emphysema, unspecified: Secondary | ICD-10-CM | POA: Diagnosis not present

## 2018-12-14 DIAGNOSIS — E7800 Pure hypercholesterolemia, unspecified: Secondary | ICD-10-CM | POA: Diagnosis present

## 2018-12-14 DIAGNOSIS — R918 Other nonspecific abnormal finding of lung field: Secondary | ICD-10-CM

## 2018-12-14 DIAGNOSIS — C3491 Malignant neoplasm of unspecified part of right bronchus or lung: Principal | ICD-10-CM | POA: Diagnosis present

## 2018-12-14 DIAGNOSIS — N50819 Testicular pain, unspecified: Secondary | ICD-10-CM | POA: Diagnosis not present

## 2018-12-14 DIAGNOSIS — R1030 Lower abdominal pain, unspecified: Secondary | ICD-10-CM | POA: Diagnosis not present

## 2018-12-14 DIAGNOSIS — Z79899 Other long term (current) drug therapy: Secondary | ICD-10-CM

## 2018-12-14 DIAGNOSIS — E785 Hyperlipidemia, unspecified: Secondary | ICD-10-CM | POA: Diagnosis present

## 2018-12-14 DIAGNOSIS — Z7982 Long term (current) use of aspirin: Secondary | ICD-10-CM

## 2018-12-14 DIAGNOSIS — Z791 Long term (current) use of non-steroidal anti-inflammatories (NSAID): Secondary | ICD-10-CM

## 2018-12-14 DIAGNOSIS — R590 Localized enlarged lymph nodes: Secondary | ICD-10-CM | POA: Diagnosis present

## 2018-12-14 DIAGNOSIS — Z20828 Contact with and (suspected) exposure to other viral communicable diseases: Secondary | ICD-10-CM | POA: Diagnosis present

## 2018-12-14 DIAGNOSIS — Z9889 Other specified postprocedural states: Secondary | ICD-10-CM

## 2018-12-14 DIAGNOSIS — I700 Atherosclerosis of aorta: Secondary | ICD-10-CM | POA: Diagnosis not present

## 2018-12-14 DIAGNOSIS — F17210 Nicotine dependence, cigarettes, uncomplicated: Secondary | ICD-10-CM | POA: Diagnosis present

## 2018-12-14 DIAGNOSIS — Z9660 Presence of unspecified orthopedic joint implant: Secondary | ICD-10-CM | POA: Diagnosis present

## 2018-12-14 DIAGNOSIS — I10 Essential (primary) hypertension: Secondary | ICD-10-CM | POA: Diagnosis present

## 2018-12-14 DIAGNOSIS — N329 Bladder disorder, unspecified: Secondary | ICD-10-CM | POA: Diagnosis present

## 2018-12-14 HISTORY — DX: Unspecified dementia, unspecified severity, without behavioral disturbance, psychotic disturbance, mood disturbance, and anxiety: F03.90

## 2018-12-14 LAB — CBC WITH DIFFERENTIAL/PLATELET
Abs Immature Granulocytes: 0 10*3/uL (ref 0.00–0.07)
Basophils Absolute: 0.1 10*3/uL (ref 0.0–0.1)
Basophils Relative: 1 %
Eosinophils Absolute: 0.4 10*3/uL (ref 0.0–0.5)
Eosinophils Relative: 3 %
HCT: 40.4 % (ref 39.0–52.0)
Hemoglobin: 13.4 g/dL (ref 13.0–17.0)
Lymphocytes Relative: 19 %
Lymphs Abs: 2.5 10*3/uL (ref 0.7–4.0)
MCH: 21 pg — ABNORMAL LOW (ref 26.0–34.0)
MCHC: 33.2 g/dL (ref 30.0–36.0)
MCV: 63.4 fL — ABNORMAL LOW (ref 80.0–100.0)
Monocytes Absolute: 1 10*3/uL (ref 0.1–1.0)
Monocytes Relative: 8 %
Neutro Abs: 9 10*3/uL — ABNORMAL HIGH (ref 1.7–7.7)
Neutrophils Relative %: 69 %
Platelets: 749 10*3/uL — ABNORMAL HIGH (ref 150–400)
RBC: 6.37 MIL/uL — ABNORMAL HIGH (ref 4.22–5.81)
RDW: 16.9 % — ABNORMAL HIGH (ref 11.5–15.5)
WBC: 13.1 10*3/uL — ABNORMAL HIGH (ref 4.0–10.5)
nRBC: 0 % (ref 0.0–0.2)
nRBC: 0 /100 WBC

## 2018-12-14 LAB — URINALYSIS, ROUTINE W REFLEX MICROSCOPIC
Bilirubin Urine: NEGATIVE
Glucose, UA: NEGATIVE mg/dL
Hgb urine dipstick: NEGATIVE
Ketones, ur: NEGATIVE mg/dL
Leukocytes,Ua: NEGATIVE
Nitrite: NEGATIVE
Protein, ur: NEGATIVE mg/dL
Specific Gravity, Urine: 1.021 (ref 1.005–1.030)
pH: 5 (ref 5.0–8.0)

## 2018-12-14 LAB — I-STAT CHEM 8, ED
BUN: 12 mg/dL (ref 8–23)
Calcium, Ion: 1.26 mmol/L (ref 1.15–1.40)
Chloride: 101 mmol/L (ref 98–111)
Creatinine, Ser: 0.8 mg/dL (ref 0.61–1.24)
Glucose, Bld: 117 mg/dL — ABNORMAL HIGH (ref 70–99)
HCT: 42 % (ref 39.0–52.0)
Hemoglobin: 14.3 g/dL (ref 13.0–17.0)
Potassium: 4.7 mmol/L (ref 3.5–5.1)
Sodium: 135 mmol/L (ref 135–145)
TCO2: 31 mmol/L (ref 22–32)

## 2018-12-14 LAB — COMPREHENSIVE METABOLIC PANEL
ALT: 39 U/L (ref 0–44)
AST: 45 U/L — ABNORMAL HIGH (ref 15–41)
Albumin: 2.5 g/dL — ABNORMAL LOW (ref 3.5–5.0)
Alkaline Phosphatase: 87 U/L (ref 38–126)
Anion gap: 10 (ref 5–15)
BUN: 10 mg/dL (ref 8–23)
CO2: 26 mmol/L (ref 22–32)
Calcium: 10.2 mg/dL (ref 8.9–10.3)
Chloride: 100 mmol/L (ref 98–111)
Creatinine, Ser: 0.87 mg/dL (ref 0.61–1.24)
GFR calc Af Amer: >60 mL/min (ref >60)
GFR calc non Af Amer: >60 mL/min (ref >60)
Glucose, Bld: 121 mg/dL — ABNORMAL HIGH (ref 70–99)
Potassium: 4.6 mmol/L (ref 3.5–5.1)
Sodium: 136 mmol/L (ref 135–145)
Total Bilirubin: 1.8 mg/dL — ABNORMAL HIGH (ref 0.3–1.2)
Total Protein: 8.1 g/dL (ref 6.5–8.1)

## 2018-12-14 LAB — LIPASE, BLOOD: Lipase: 25 U/L (ref 11–51)

## 2018-12-14 LAB — POC OCCULT BLOOD, ED: Fecal Occult Bld: NEGATIVE

## 2018-12-14 MED ORDER — IOHEXOL 300 MG/ML  SOLN
75.0000 mL | Freq: Once | INTRAMUSCULAR | Status: AC | PRN
  Administered 2018-12-14: 75 mL via INTRAVENOUS

## 2018-12-14 MED ORDER — IOHEXOL 300 MG/ML  SOLN
100.0000 mL | Freq: Once | INTRAMUSCULAR | Status: AC | PRN
  Administered 2018-12-14: 100 mL via INTRAVENOUS

## 2018-12-14 MED ORDER — SODIUM CHLORIDE 0.9 % IV BOLUS
1000.0000 mL | Freq: Once | INTRAVENOUS | Status: AC
  Administered 2018-12-14: 1000 mL via INTRAVENOUS

## 2018-12-14 NOTE — ED Notes (Signed)
Pt in US

## 2018-12-14 NOTE — ED Notes (Signed)
Pt in CT.

## 2018-12-14 NOTE — ED Provider Notes (Signed)
[facility] Provider Note   CSN: [id number] Arrival date & time: 12/14/18  1603    History   Chief Complaint Chief Complaint  Patient presents with   Groin Pain    HPI Richard [name] is a 78 y.o. male with past 1 history of hypertension, dementia who presents for evaluation of groin pain x1 week.  Per patient's daughter who provides most of the history, patient lives with her.  She states that for the last week or so, he has been complaining of pain in the groin.  She states that he will not let her evaluate the groin.  She states that he has been having increased urinary frequency.  She has also noted some blood in his underwear but is not sure if it is from the stool or urine.  She states he has had some diarrhea initially about 4 days ago but states that that has improved.  She reports that patient has been complaining of some lower abdominal pain.  Additionally, she states that he has not wanted to eat or drink much.  He has not had any vomiting, fevers.  She reports that he has a chronic cough due to smoking and has not had changes and cough.  He recently traveled to [place] for a funeral.  No known COVID-19 exposure.     The history is provided by a relative.    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 Authorizing Provider  acetaminophen (TYLENOL) 500 MG tablet Take 2 tablets (1,000 mg total) by mouth every 8 (eight) hours as needed for mild pain. 11/26/16  Yes Vanessa Kick, MD  aspirin (ASPIRIN 81) 81 MG EC  tablet Take 1 tablet (81 mg total) by mouth daily. 03/18/18  Yes Clent Demark, PA-C  atorvastatin (LIPITOR) 20 MG tablet Take 1 tablet (20 mg total) by mouth every evening. 09/06/18  Yes [name], NP  hydrochlorothiazide (HYDRODIURIL) 12.5 MG tablet Take 1 tablet by mouth once daily 10/12/18  Yes Edwards, [name], NP  Omega-3 Fatty Acids (FISH OIL PO) Take 1 capsule by mouth daily.   Yes [provider]  losartan (COZAAR) 50 MG tablet Take 1 tablet (50 mg total) by mouth daily. 09/06/18   [name], NP    Family History History reviewed. No pertinent family history.  Social History Social History   Tobacco Use   Smoking status: Current Every Day Smoker    Packs/day: 0.25    Types: Cigarettes   Smokeless tobacco: Never Used  Substance Use Topics   Alcohol use: Yes    Comment: occasional   Drug use: No     Allergies   Patient has no known allergies.   Review of Systems Review of Systems  Constitutional: Positive for appetite change. Negative for fever.  Respiratory: Negative for cough and shortness of breath.   Cardiovascular: Negative for chest pain.  Gastrointestinal: Positive for abdominal pain. Negative for nausea and  vomiting.  Genitourinary: Positive for hematuria and testicular pain. Negative for dysuria.  Neurological: Negative for headaches.  All other systems reviewed and are negative.    Physical Exam Updated Vital Signs BP (!) 110/53 (BP Location: Right Arm)    Pulse 68    Temp 98 F (36.7 C) (Oral)    Resp 17    Wt 70.6 kg    SpO2 98%    BMI 19.45 kg/m   Physical Exam Vitals signs and nursing note reviewed. Exam conducted with a chaperone present.  Constitutional:      Appearance: Normal appearance. He is well-developed.  HENT:     Head: Normocephalic and atraumatic.  Eyes:     General: Lids are normal.     Conjunctiva/sclera: Conjunctivae normal.     Pupils: Pupils are equal, round, and reactive to light.    Neck:     Musculoskeletal: Full passive range of motion without pain.  Cardiovascular:     Rate and Rhythm: Normal rate and regular rhythm.     Pulses: Normal pulses.     Heart sounds: Normal heart sounds. No murmur. No friction rub. No gallop.   Pulmonary:     Effort: Pulmonary effort is normal.     Breath sounds: Normal breath sounds.  Abdominal:     Palpations: Abdomen is soft. Abdomen is not rigid.     Tenderness: There is abdominal tenderness in the right lower quadrant, suprapubic area and left lower quadrant. There is no guarding.     Hernia: There is no hernia in the left inguinal area or right inguinal area.     Comments: Abdomen is soft, nondistended.  Tenderness palpation in lower abdomen.  No rigidity, guarding.  Genitourinary:    Scrotum/Testes:        Right: Tenderness or swelling not present.        Left: Tenderness present. Swelling not present.     Comments: The exam was performed with a chaperone present.  It is palpation of the left testicle.  No overlying warmth, erythema, edema.  No evidence of hernia bilaterally. Musculoskeletal: Normal range of motion.  Skin:    General: Skin is warm and dry.     Capillary Refill: Capillary refill takes less than 2 seconds.  Neurological:     Mental Status: He is alert.     Comments: Follows commands.   Psychiatric:        Speech: Speech normal.      ED Treatments / Results  Labs (all labs ordered are listed, but only abnormal results are displayed) Labs Reviewed  COMPREHENSIVE METABOLIC PANEL - Abnormal; Notable for the following components:      Result Value   Glucose, Bld 121 (*)    Albumin 2.5 (*)    AST 45 (*)    Total Bilirubin 1.8 (*)    All other components within normal limits  CBC WITH DIFFERENTIAL/PLATELET - Abnormal; Notable for the following components:   WBC 13.1 (*)    RBC 6.37 (*)    MCV 63.4 (*)    MCH 21.0 (*)    RDW 16.9 (*)    Platelets 749 (*)    Neutro Abs 9.0 (*)    All other  components within normal limits  URINALYSIS, ROUTINE W REFLEX MICROSCOPIC - Abnormal; Notable for the following components:   Color, Urine AMBER (*)    APPearance HAZY (*)    All other components within normal limits  COMPREHENSIVE METABOLIC PANEL - Abnormal; Notable for  the following components:   Glucose, Bld 104 (*)    BUN 7 (*)    Albumin 2.4 (*)    All other components within normal limits  CBC WITH DIFFERENTIAL/PLATELET - Abnormal; Notable for the following components:   WBC 12.1 (*)    RBC 6.49 (*)    MCV 63.0 (*)    MCH 21.0 (*)    RDW 17.5 (*)    Platelets 716 (*)    Neutro Abs 8.3 (*)    All other components within normal limits  I-STAT CHEM 8, ED - Abnormal; Notable for the following components:   Glucose, Bld 117 (*)    All other components within normal limits  SARS CORONAVIRUS 2 (HOSPITAL ORDER, [facility])  EXPECTORATED SPUTUM ASSESSMENT W REFEX TO RESP CULTURE  LIPASE, BLOOD  HIV ANTIBODY (ROUTINE TESTING W REFLEX)  STREP PNEUMONIAE URINARY ANTIGEN  PROCALCITONIN  PROCALCITONIN  PROTIME-INR  APTT  CBC  POC OCCULT BLOOD, ED  I-STAT CREATININE, ED    EKG None  Radiology Ct Chest W Contrast  Result Date: 12/14/2018 CLINICAL DATA:  Lung mass EXAM: CT CHEST WITH CONTRAST TECHNIQUE: Multidetector CT imaging of the chest was performed during intravenous contrast administration. CONTRAST:  5mL OMNIPAQUE IOHEXOL 300 MG/ML  SOLN COMPARISON:  Abdominal CT earlier today FINDINGS: Cardiovascular: Heart is normal size. Aorta normal caliber. Coronary artery and aortic calcifications. Mediastinum/Nodes: Mediastinal and right hilar adenopathy. Low right paratracheal lymph node has a short axis diameter of 10 mm. Other similarly sized and smaller mediastinal lymph nodes. Right hilar adenopathy has short axis diameter of 13 mm. Lungs/Pleura: Large central right necrotic mass noted. There is postobstructive atelectasis or pneumonia in the right middle  lobe as well as small right pleural effusion. The central right lung mass is low-density centrally suggesting necrosis. It is difficult to accurately measure due to postobstructive airspace disease but measures approximately 5.3 x 4.3 cm on image 91. No confluent opacity or effusion on the left. Linear atelectasis or scarring at the left base. Moderate emphysema. Upper Abdomen: Imaging into the upper abdomen shows no acute findings. Musculoskeletal: No acute bony abnormality. IMPRESSION: Large central right necrotic mass measuring approximately 5.3 x 4.3 cm concerning for primary lung cancer. Postobstructive atelectasis or pneumonia in the right middle lobe. Small right pleural effusion. Right hilar and mediastinal adenopathy. Coronary artery disease. Aortic Atherosclerosis (ICD10-I70.0) and Emphysema (ICD10-J43.9). Electronically Signed   By: Rolm Baptise M.D.   On: 12/14/2018 23:40   Ct Abdomen Pelvis W Contrast  Addendum Date: 12/14/2018   ADDENDUM REPORT: 12/14/2018 23:35 ADDENDUM: Additional images were added to the original study. Delayed sequences of the extreme inferior pelvis are submitted. Only the inferior bladder is included. There is dense excreted contrast in the bladder which could potentially obscure lesion. Partially visualized prostate appears enlarged. Electronically Signed   By: Donavan Foil M.D.   On: 12/14/2018 23:35   Result Date: 12/14/2018 CLINICAL DATA:  Groin pain EXAM: CT ABDOMEN AND PELVIS WITH CONTRAST TECHNIQUE: Multidetector CT imaging of the abdomen and pelvis was performed using the standard protocol following bolus administration of intravenous contrast. CONTRAST:  168mL OMNIPAQUE IOHEXOL 300 MG/ML  SOLN COMPARISON:  None FINDINGS: Lower chest: Small right-sided pleural effusion. Partial atelectasis or mild pneumonia in the right lung base. Incompletely visualized heterogenous masslike density in the right hilar to infrahilar lung with central low attenuation, possibly  representing necrotic mass. This measures 4 cm transverse on coronal views, the cephalad extent is  incompletely imaged. Postobstructive atelectasis or pneumonia in the right middle lobe. Abrupt occlusion of the right middle lobe bronchus. Heart size is normal. Hepatobiliary: No focal liver abnormality is seen. No gallstones, gallbladder wall thickening, or biliary dilatation. Pancreas: Unremarkable. No pancreatic ductal dilatation or surrounding inflammatory changes. Spleen: Normal in size without focal abnormality. Adrenals/Urinary Tract: Adrenal glands are normal. Kidneys are within normal limits. No hydronephrosis. Bladder largely obscured by artifact from left hip hardware. Bladder may be thick-walled. Suspicion of polypoid mass either arising from prostate or the posterior wall of the bladder. Stomach/Bowel: The stomach is nonenlarged. No dilated small bowel. No bowel wall thickening. Negative appendix. Vascular/Lymphatic: Extensive aortic atherosclerosis without aneurysm. Heavily calcified distal common iliac, external iliac and internal iliac arteries with possible segmental occlusion of left proximal external iliac artery. No significantly enlarged lymph nodes. Reproductive: Prostate is likely enlarged. Possible polypoid projection off the anterior prostate. Other: Negative for free air or free fluid Musculoskeletal: Left hip replacement with artifact. Diffuse degenerative changes of the spine. No acute osseous abnormality. IMPRESSION: 1. Small right-sided pleural effusion. Atelectasis/consolidation of the right middle lobe with incompletely visualized heterogenous masslike density in the right hilar/infrahilar region with central low attenuation. Primary concern is for necrotic lung mass/carcinoma with postobstructive atelectasis or pneumonia of the right middle lobe. Dedicated chest CT, preferably with contrast, when clinically feasible is recommended. 2. Small right-sided pleural effusion with probable  atelectasis at the right posterior lung base. 3. Suspicion of polypoid mass either arising from posterior wall of the bladder versus the prostate gland. Further characterisation limited due to artifact from left hip hardware. 4. 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 Electronically Signed: By: Donavan Foil M.D. On: 12/14/2018 22:04   US Scrotum W/doppler  Result Date: 12/14/2018 CLINICAL DATA:  78 year old male with testicular pain. EXAM: SCROTAL ULTRASOUND DOPPLER ULTRASOUND OF THE TESTICLES TECHNIQUE: Complete ultrasound examination of the testicles, epididymis, and other scrotal structures was performed. Color and spectral Doppler ultrasound were also utilized to evaluate blood flow to the testicles. COMPARISON:  CT Abdomen and Pelvis today are reported separately. FINDINGS: Right testicle Measurements: 3.4 x 1.8 x 1.8 centimeters. Heterogeneous echotexture (image 2) but no discrete testicular mass. Left testicle Measurements: 3.7 x 1.7 x 1.9 centimeters. Heterogeneous echotexture (image 26) but no discrete testicular mass. Right epididymis:  Normal in size and appearance. Left epididymis:  Could not be identified. Hydrocele:  None visualized. Varicocele:  None visualized. Pulsed Doppler interrogation of both testes demonstrates normal low resistance arterial and venous waveforms bilaterally. IMPRESSION: 1. No evidence of testicular torsion. 2. Symmetric heterogeneity of both testes with no discrete testicular mass. Electronically Signed   By: Genevie Ann M.D.   On: 12/14/2018 21:52    Procedures Procedures (including critical care time)  Medications Ordered in ED Medications  acetaminophen (TYLENOL) tablet 650 mg (has no administration in time range)  HYDROcodone-acetaminophen (NORCO/VICODIN) 5-325 MG per tablet 1 tablet (has no administration in time range)  sodium chloride flush (NS) 0.9 % injection  3 mL (3 mLs Intravenous Given 12/15/18 0956)  sodium chloride flush (NS) 0.9 % injection 3 mL (has no administration in time range)  0.9 %  sodium chloride infusion (has no administration in time range)  ondansetron (ZOFRAN) tablet 4 mg (has no administration in time range)    Or  ondansetron (ZOFRAN) injection 4 mg (has no administration in time range)  polyethylene glycol (MIRALAX / GLYCOLAX) packet  17 g (has no administration in time range)  cefTRIAXone (ROCEPHIN) 2 g in sodium chloride 0.9 % 100 mL IVPB (has no administration in time range)  azithromycin (ZITHROMAX) 500 mg in sodium chloride 0.9 % 250 mL IVPB (has no administration in time range)  nicotine (NICODERM CQ - dosed in mg/24 hr) patch 7 mg (7 mg Transdermal Patch Applied 12/15/18 0955)  atorvastatin (LIPITOR) tablet 20 mg (20 mg Oral Given 12/15/18 1707)  losartan (COZAAR) tablet 50 mg (50 mg Oral Given 12/15/18 0954)  iohexol (OMNIPAQUE) 300 MG/ML solution 100 mL (100 mLs Intravenous Contrast Given 12/14/18 2133)  sodium chloride 0.9 % bolus 1,000 mL (0 mLs Intravenous Stopped 12/15/18 0023)  iohexol (OMNIPAQUE) 300 MG/ML solution 75 mL (75 mLs Intravenous Contrast Given 12/14/18 2312)  cefTRIAXone (ROCEPHIN) 1 g in sodium chloride 0.9 % 100 mL IVPB (0 g Intravenous Stopped 12/15/18 0104)  azithromycin (ZITHROMAX) 500 mg in sodium chloride 0.9 % 250 mL IVPB (0 mg Intravenous Stopped 12/15/18 0350)     Initial Impression / Assessment and Plan / ED Course  I have reviewed the triage vital signs and the nursing notes.  Pertinent labs & imaging results that were available during my care of the patient were reviewed by me and considered in my medical decision making (see chart for details).        78 year old male who presents for evaluation of groin pain x1 week.  Daughter is at bedside who provides most of the history.  She reports that over the last week, he has been complaining of some groin pain.  Patient with history of dementia.   Daughter states that patient will not let her evaluate the area.  She does not states that she noticed some blood in his underwear but is unsure if that is coming from the rectum or is in his urine.  She has not noticed any dark or bloody stools.  She has not noticed any fever or vomiting.  He had been complaining of some abdominal pain earlier in the week.  She states that he has not been able to tolerate much p.o. and has been getting some generalized weakness secondary to that. Patient is afebrile, non-toxic appearing, sitting comfortably on examination table. Vital signs reviewed and stable.  On initial ED evaluation, he does have tenderness to palpation noted to left testicle.  No overlying warmth or erythema.  Additionally, he has some mild lower abdominal tenderness.  Plan for labs, scrotal ultrasound, CT abdomen pelvis.  UA shows no evidence of infectious etiology.  Lipase is unremarkable.  CMP shows normal BUN and creatinine.  Fecal occult is negative.  Scrotal ultrasound shows no evidence of testicular torsion.  No testicular mass noted.  CT abd/pelvis shows small right-sided pleural effusion.  There is additional some obstructive atelectasis versus consolidation in the right middle lobe with a masslike density noted.  Primary concern for necrotic lung mass/carcinoma with postobstructive atelectasis or pneumonia of the right middle lobe.  They recommend a chest CT.  Additionally, he has suspicion of polypoid mass either arising from posterior wall bladder versus prostate gland.  Further characterization limited due to artifact from left hip hardware.  Will plan for CT chest with evaluation of scrotum.  Patient signed out to Lorre Munroe, PA-C with CT chest and disposition pending.   Portions of this note were generated with [employer]. Dictation errors may occur despite best attempts at proofreading.   Final Clinical Impressions(s) / ED Diagnoses   Final diagnoses:  Community  acquired pneumonia, unspecified laterality  Lung mass    ED Discharge Orders    None       Desma Mcgregor 12/15/18 2256    Blanchie Dessert, MD 12/20/18 [phone]

## 2018-12-14 NOTE — ED Notes (Signed)
ED Provider at bedside. 

## 2018-12-14 NOTE — ED Triage Notes (Signed)
Pt with hx of dementia, daughter primary historian. Pt having 1 week of groin pain, pt will not let daughter look at the area but daughter did notice some blood in his brief. Pt alert.

## 2018-12-15 ENCOUNTER — Encounter (HOSPITAL_COMMUNITY): Payer: Self-pay | Admitting: Family Medicine

## 2018-12-15 ENCOUNTER — Observation Stay (HOSPITAL_COMMUNITY): Payer: Medicare HMO

## 2018-12-15 DIAGNOSIS — R918 Other nonspecific abnormal finding of lung field: Secondary | ICD-10-CM | POA: Diagnosis not present

## 2018-12-15 DIAGNOSIS — J189 Pneumonia, unspecified organism: Secondary | ICD-10-CM | POA: Diagnosis present

## 2018-12-15 DIAGNOSIS — Z20828 Contact with and (suspected) exposure to other viral communicable diseases: Secondary | ICD-10-CM | POA: Diagnosis not present

## 2018-12-15 DIAGNOSIS — J90 Pleural effusion, not elsewhere classified: Secondary | ICD-10-CM | POA: Diagnosis not present

## 2018-12-15 DIAGNOSIS — R1030 Lower abdominal pain, unspecified: Secondary | ICD-10-CM | POA: Diagnosis present

## 2018-12-15 DIAGNOSIS — Z9660 Presence of unspecified orthopedic joint implant: Secondary | ICD-10-CM | POA: Diagnosis present

## 2018-12-15 DIAGNOSIS — R896 Abnormal cytological findings in specimens from other organs, systems and tissues: Secondary | ICD-10-CM | POA: Diagnosis not present

## 2018-12-15 DIAGNOSIS — R1900 Intra-abdominal and pelvic swelling, mass and lump, unspecified site: Secondary | ICD-10-CM | POA: Diagnosis not present

## 2018-12-15 DIAGNOSIS — I2510 Atherosclerotic heart disease of native coronary artery without angina pectoris: Secondary | ICD-10-CM | POA: Diagnosis not present

## 2018-12-15 DIAGNOSIS — R590 Localized enlarged lymph nodes: Secondary | ICD-10-CM | POA: Diagnosis present

## 2018-12-15 DIAGNOSIS — F0390 Unspecified dementia without behavioral disturbance: Secondary | ICD-10-CM | POA: Diagnosis present

## 2018-12-15 DIAGNOSIS — Z7982 Long term (current) use of aspirin: Secondary | ICD-10-CM | POA: Diagnosis not present

## 2018-12-15 DIAGNOSIS — C3491 Malignant neoplasm of unspecified part of right bronchus or lung: Secondary | ICD-10-CM | POA: Diagnosis not present

## 2018-12-15 DIAGNOSIS — I10 Essential (primary) hypertension: Secondary | ICD-10-CM

## 2018-12-15 DIAGNOSIS — C342 Malignant neoplasm of middle lobe, bronchus or lung: Secondary | ICD-10-CM | POA: Diagnosis not present

## 2018-12-15 DIAGNOSIS — J181 Lobar pneumonia, unspecified organism: Secondary | ICD-10-CM | POA: Diagnosis not present

## 2018-12-15 DIAGNOSIS — E785 Hyperlipidemia, unspecified: Secondary | ICD-10-CM | POA: Diagnosis not present

## 2018-12-15 DIAGNOSIS — Z79899 Other long term (current) drug therapy: Secondary | ICD-10-CM | POA: Diagnosis not present

## 2018-12-15 DIAGNOSIS — F17210 Nicotine dependence, cigarettes, uncomplicated: Secondary | ICD-10-CM | POA: Diagnosis present

## 2018-12-15 DIAGNOSIS — Z801 Family history of malignant neoplasm of trachea, bronchus and lung: Secondary | ICD-10-CM | POA: Diagnosis not present

## 2018-12-15 DIAGNOSIS — Z791 Long term (current) use of non-steroidal anti-inflammatories (NSAID): Secondary | ICD-10-CM | POA: Diagnosis not present

## 2018-12-15 DIAGNOSIS — J188 Other pneumonia, unspecified organism: Secondary | ICD-10-CM | POA: Diagnosis not present

## 2018-12-15 DIAGNOSIS — R69 Illness, unspecified: Secondary | ICD-10-CM | POA: Diagnosis not present

## 2018-12-15 DIAGNOSIS — N329 Bladder disorder, unspecified: Secondary | ICD-10-CM | POA: Diagnosis not present

## 2018-12-15 DIAGNOSIS — E7800 Pure hypercholesterolemia, unspecified: Secondary | ICD-10-CM | POA: Diagnosis present

## 2018-12-15 HISTORY — DX: Other nonspecific abnormal finding of lung field: R91.8

## 2018-12-15 LAB — COMPREHENSIVE METABOLIC PANEL
ALT: 33 U/L (ref 0–44)
AST: 25 U/L (ref 15–41)
Albumin: 2.4 g/dL — ABNORMAL LOW (ref 3.5–5.0)
Alkaline Phosphatase: 83 U/L (ref 38–126)
Anion gap: 13 (ref 5–15)
BUN: 7 mg/dL — ABNORMAL LOW (ref 8–23)
CO2: 23 mmol/L (ref 22–32)
Calcium: 9.6 mg/dL (ref 8.9–10.3)
Chloride: 102 mmol/L (ref 98–111)
Creatinine, Ser: 0.8 mg/dL (ref 0.61–1.24)
GFR calc Af Amer: >60 mL/min (ref >60)
GFR calc non Af Amer: >60 mL/min (ref >60)
Glucose, Bld: 104 mg/dL — ABNORMAL HIGH (ref 70–99)
Potassium: 3.9 mmol/L (ref 3.5–5.1)
Sodium: 138 mmol/L (ref 135–145)
Total Bilirubin: 0.6 mg/dL (ref 0.3–1.2)
Total Protein: 7.5 g/dL (ref 6.5–8.1)

## 2018-12-15 LAB — CBC WITH DIFFERENTIAL/PLATELET
Abs Immature Granulocytes: 0 10*3/uL (ref 0.00–0.07)
Basophils Absolute: 0 10*3/uL (ref 0.0–0.1)
Basophils Relative: 0 %
Eosinophils Absolute: 0.5 10*3/uL (ref 0.0–0.5)
Eosinophils Relative: 4 %
HCT: 40.9 % (ref 39.0–52.0)
Hemoglobin: 13.6 g/dL (ref 13.0–17.0)
Lymphocytes Relative: 23 %
Lymphs Abs: 2.8 10*3/uL (ref 0.7–4.0)
MCH: 21 pg — ABNORMAL LOW (ref 26.0–34.0)
MCHC: 33.3 g/dL (ref 30.0–36.0)
MCV: 63 fL — ABNORMAL LOW (ref 80.0–100.0)
Monocytes Absolute: 0.5 10*3/uL (ref 0.1–1.0)
Monocytes Relative: 4 %
Neutro Abs: 8.3 10*3/uL — ABNORMAL HIGH (ref 1.7–7.7)
Neutrophils Relative %: 69 %
Platelets: 716 10*3/uL — ABNORMAL HIGH (ref 150–400)
RBC: 6.49 MIL/uL — ABNORMAL HIGH (ref 4.22–5.81)
RDW: 17.5 % — ABNORMAL HIGH (ref 11.5–15.5)
WBC: 12.1 10*3/uL — ABNORMAL HIGH (ref 4.0–10.5)
nRBC: 0 % (ref 0.0–0.2)
nRBC: 0 /100 WBC

## 2018-12-15 LAB — STREP PNEUMONIAE URINARY ANTIGEN: Strep Pneumo Urinary Antigen: NEGATIVE

## 2018-12-15 LAB — HIV ANTIBODY (ROUTINE TESTING W REFLEX): HIV Screen 4th Generation wRfx: NONREACTIVE

## 2018-12-15 LAB — SARS CORONAVIRUS 2 BY RT PCR (HOSPITAL ORDER, PERFORMED IN ~~LOC~~ HOSPITAL LAB): SARS Coronavirus 2: NEGATIVE

## 2018-12-15 LAB — ECHOCARDIOGRAM COMPLETE: Weight: 2490.32 oz

## 2018-12-15 LAB — PROCALCITONIN: Procalcitonin: <0.1 ng/mL

## 2018-12-15 MED ORDER — SODIUM CHLORIDE 0.9% FLUSH
3.0000 mL | Freq: Two times a day (BID) | INTRAVENOUS | Status: DC
  Administered 2018-12-15 – 2018-12-17 (×4): 3 mL via INTRAVENOUS

## 2018-12-15 MED ORDER — SODIUM CHLORIDE 0.9 % IV SOLN
1.0000 g | Freq: Once | INTRAVENOUS | Status: AC
  Administered 2018-12-15: 1 g via INTRAVENOUS
  Filled 2018-12-15: qty 10

## 2018-12-15 MED ORDER — ACETAMINOPHEN 325 MG PO TABS: 650.0000 mg | ORAL_TABLET | Freq: Four times a day (QID) | ORAL | Status: DC | PRN

## 2018-12-15 MED ORDER — ONDANSETRON HCL 4 MG PO TABS: 4.0000 mg | ORAL_TABLET | Freq: Four times a day (QID) | ORAL | Status: DC | PRN

## 2018-12-15 MED ORDER — HYDROCODONE-ACETAMINOPHEN 5-325 MG PO TABS: 1.0000 | ORAL_TABLET | ORAL | Status: DC | PRN

## 2018-12-15 MED ORDER — SODIUM CHLORIDE 0.9 % IV SOLN
500.0000 mg | INTRAVENOUS | Status: DC
  Administered 2018-12-16 – 2018-12-17 (×2): 500 mg via INTRAVENOUS
  Filled 2018-12-15 (×2): qty 500

## 2018-12-15 MED ORDER — SODIUM CHLORIDE 0.9% FLUSH: 3.0000 mL | INTRAVENOUS | Status: DC | PRN

## 2018-12-15 MED ORDER — SODIUM CHLORIDE 0.9 % IV SOLN: 250.0000 mL | INTRAVENOUS | Status: DC | PRN

## 2018-12-15 MED ORDER — POLYETHYLENE GLYCOL 3350 17 G PO PACK: 17.0000 g | PACK | Freq: Every day | ORAL | Status: DC | PRN

## 2018-12-15 MED ORDER — NICOTINE 7 MG/24HR TD PT24
7.0000 mg | MEDICATED_PATCH | Freq: Every day | TRANSDERMAL | Status: DC
  Administered 2018-12-15 – 2018-12-17 (×3): 7 mg via TRANSDERMAL
  Filled 2018-12-15 (×3): qty 1

## 2018-12-15 MED ORDER — ONDANSETRON HCL 4 MG/2ML IJ SOLN: 4.0000 mg | Freq: Four times a day (QID) | INTRAMUSCULAR | Status: DC | PRN

## 2018-12-15 MED ORDER — ATORVASTATIN CALCIUM 10 MG PO TABS
20.0000 mg | ORAL_TABLET | Freq: Every evening | ORAL | Status: DC
  Administered 2018-12-15 – 2018-12-16 (×2): 20 mg via ORAL
  Filled 2018-12-15 (×2): qty 2

## 2018-12-15 MED ORDER — ENOXAPARIN SODIUM 40 MG/0.4ML ~~LOC~~ SOLN: 40.0000 mg | SUBCUTANEOUS | Status: DC

## 2018-12-15 MED ORDER — LOSARTAN POTASSIUM 50 MG PO TABS
50.0000 mg | ORAL_TABLET | Freq: Every day | ORAL | Status: DC
  Administered 2018-12-15 – 2018-12-17 (×3): 50 mg via ORAL
  Filled 2018-12-15 (×3): qty 1

## 2018-12-15 MED ORDER — SODIUM CHLORIDE 0.9 % IV SOLN
2.0000 g | INTRAVENOUS | Status: DC
  Administered 2018-12-15 – 2018-12-16 (×2): 2 g via INTRAVENOUS
  Filled 2018-12-15 (×2): qty 2

## 2018-12-15 MED ORDER — SODIUM CHLORIDE 0.9 % IV SOLN
500.0000 mg | Freq: Once | INTRAVENOUS | Status: AC
  Administered 2018-12-15: 500 mg via INTRAVENOUS
  Filled 2018-12-15: qty 500

## 2018-12-15 MED ORDER — MORPHINE SULFATE (PF) 4 MG/ML IV SOLN: 3.0000 mg | INTRAVENOUS | Status: DC | PRN

## 2018-12-15 NOTE — Evaluation (Signed)
Physical Therapy Evaluation Patient Details Name: Richard [name] MRN: [id number] DOB: 1940/08/30 Today's Date: 12/15/2018   History of Present Illness  Pt is a 78 y/o male admitted secondary to R groin pain. CT imaging revealed lung mass. Workup pending. PMH includes dementia, current smoker, and HTN.   Clinical Impression  Pt admitted secondary to problem above with deficits below. Pt requiring min guard to min A for mobility tasks. Pt easily distracted and stated multiple times he wanted a cigarette. Likely at baseline cognitively as pt with dementia. Per RN and pt, pt lives with daughter and daughter helps to care for pt. Will continue to follow acutely to maximize functional mobility independence and safety.     Follow Up Recommendations Home health PT;Supervision/Assistance - 24 hour    Equipment Recommendations  None recommended by PT    Recommendations for Other Services       Precautions / Restrictions Precautions Precautions: Fall Restrictions Weight Bearing Restrictions: No      Mobility  Bed Mobility Overal bed mobility: Needs Assistance Bed Mobility: Supine to Sit     Supine to sit: Min guard     General bed mobility comments: Min guard for safety. Increased time required.   Transfers Overall transfer level: Needs assistance Equipment used: None Transfers: Sit to/from Stand Sit to Stand: Min assist         General transfer comment: Min A for lift assist and steadying.   Ambulation/Gait Ambulation/Gait assistance: Min guard Gait Distance (Feet): 125 Feet Assistive device: None Gait Pattern/deviations: Step-through pattern;Decreased stride length Gait velocity: Decreased    General Gait Details: Slow, mildly unsteady gait, however, no overt LOB noted. Pt easily distracted and asking to go look for a cigarette and to look for his daughter.   Stairs            Wheelchair Mobility    Modified Rankin (Stroke Patients Only)       Balance  Overall balance assessment: Needs assistance Sitting-balance support: No upper extremity supported;Feet supported Sitting balance-Leahy Scale: Good     Standing balance support: No upper extremity supported;During functional activity Standing balance-Leahy Scale: Fair                               Pertinent Vitals/Pain Pain Assessment: Faces Faces Pain Scale: Hurts little more Pain Location: R knee, R groin Pain Descriptors / Indicators: Grimacing;Guarding Pain Intervention(s): Limited activity within patient's tolerance;Monitored during session;Repositioned    Home Living Family/patient expects to be discharged to:: Private residence Living Arrangements: Children Available Help at Discharge: Family;Available 24 hours/day Type of Home: House Home Access: Stairs to enter   [employer] of Steps: 2-3 Home Layout: Two level Home Equipment: None Additional Comments: Unsure of accuracy given dementia.     Prior Function Level of Independence: Needs assistance   Gait / Transfers Assistance Needed: Pt reports daughter assisted with stair navigation. Otherwise was independent with ambulation           Hand Dominance        Extremity/Trunk Assessment   Upper Extremity Assessment Upper Extremity Assessment: Overall WFL for tasks assessed    Lower Extremity Assessment Lower Extremity Assessment: Generalized weakness    Cervical / Trunk Assessment Cervical / Trunk Assessment: Kyphotic  Communication   Communication: No difficulties  Cognition Arousal/Alertness: Awake/alert Behavior During Therapy: WFL for tasks assessed/performed Overall Cognitive Status: History of cognitive impairments - at baseline  General Comments: Dementia at baseline. Pt very fixated on wanting a cigarette and asking where his "baby" was meaning his daughter.       General Comments      Exercises     Assessment/Plan     PT Assessment Patient needs continued PT services  PT Problem List Decreased strength;Decreased balance;Decreased mobility;Decreased cognition;Decreased safety awareness;Decreased knowledge of precautions;Decreased knowledge of use of DME       PT Treatment Interventions DME instruction;Gait training;Stair training;Functional mobility training;Therapeutic activities;Therapeutic exercise;Balance training;Patient/family education;Cognitive remediation    PT Goals (Current goals can be found in the Care Plan section)  Acute Rehab PT Goals Patient Stated Goal: "to go find a cigarette" PT Goal Formulation: With patient Time For Goal Achievement: 12/29/18 Potential to Achieve Goals: Good    Frequency Min 3X/week   Barriers to discharge        Co-evaluation               AM-PAC PT "6 Clicks" Mobility  Outcome Measure Help needed turning from your back to your side while in a flat bed without using bedrails?: A Little Help needed moving from lying on your back to sitting on the side of a flat bed without using bedrails?: A Little Help needed moving to and from a bed to a chair (including a wheelchair)?: A Little Help needed standing up from a chair using your arms (e.g., wheelchair or bedside chair)?: A Little Help needed to walk in hospital room?: A Little Help needed climbing 3-5 steps with a railing? : A Lot 6 Click Score: 17    End of Session Equipment Utilized During Treatment: Gait belt Activity Tolerance: Patient tolerated treatment well Patient left: Other (comment);with nursing/sitter in room(sitting in [facility] with RN to be transferred to [facility]) Nurse Communication: Mobility status PT Visit Diagnosis: Unsteadiness on feet (R26.81);Muscle weakness (generalized) (M62.81)    Time: 1610-9604 PT Time Calculation (min) (ACUTE ONLY): 17 min   Charges:   PT Evaluation $PT Eval Low Complexity: [place], PT, DPT  Acute Rehabilitation Services   Pager: [phone] Office: [phone]   Rudean Hitt 12/15/2018, 12:27 PM

## 2018-12-15 NOTE — ED Notes (Signed)
ED TO INPATIENT HANDOFF REPORT  ED Nurse Name and Phone #: [phone]  S Name/Age/Gender Richard [name] 78 y.o. male Room/Bed: 021C/021C  Code Status   Code Status: Not on file  Home/SNF/Other Home Patient oriented to: self Is this baseline? Yes   Triage Complete: Triage complete  Chief Complaint Groin Pain  Triage Note Pt with hx of dementia, daughter primary historian. Pt having 1 week of groin pain, pt will not let daughter look at the area but daughter did notice some blood in his brief. Pt alert.   Allergies No Known Allergies  Level of Care/Admitting Diagnosis ED Disposition    ED Disposition Condition [facility]: [place] [[id number]]  Level of Care: Med-Surg [16]  I expect the patient will be discharged within 24 hours: Yes  LOW acuity---Tx typically complete <24 hrs---ACUTE conditions typically can be evaluated <24 hours---LABS likely to return to acceptable levels <24 hours---IS near functional baseline---EXPECTED to return to current living arrangement---NOT newly hypoxic: Does not meet criteria for 5C-Observation unit  Covid Evaluation: Confirmed COVID Negative  Diagnosis: Lung mass [601093]  Admitting Physician: Vianne Bulls [[id number]]  Attending Physician: Vianne Bulls [[id number]]  PT Class (Do Not Modify): Observation [104]  PT Acc Code (Do Not Modify): Observation [10022]       B Medical/Surgery History Past Medical History:  Diagnosis Date  . High cholesterol   . Hypertension    Past Surgical History:  Procedure Laterality Date  . JOINT REPLACEMENT       A IV Location/Drains/Wounds Patient Lines/Drains/Airways Status   Active Line/Drains/Airways    Name:   Placement date:   Placement time:   Site:   Days:   Peripheral IV 12/14/18 Right Antecubital   12/14/18    2019    Antecubital   1          Intake/Output Last 24 hours  Intake/Output Summary (Last 24 hours) at 12/15/2018 0257 Last data filed at  12/15/2018 0104 Gross per 24 hour  Intake 1100 ml  Output -  Net 1100 ml    Labs/Imaging Results for orders placed or performed during the hospital encounter of 12/14/18 (from the past 48 hour(s))  POC occult blood, ED Provider will collect     Status: None   Collection Time: 12/14/18  8:16 PM  Result Value Ref Range   Fecal Occult Bld NEGATIVE NEGATIVE  Comprehensive metabolic panel     Status: Abnormal   Collection Time: 12/14/18  8:24 PM  Result Value Ref Range   Sodium 136 135 - 145 mmol/L   Potassium 4.6 3.5 - 5.1 mmol/L   Chloride 100 98 - 111 mmol/L   CO2 26 22 - 32 mmol/L   Glucose, Bld 121 (H) 70 - 99 mg/dL   BUN 10 8 - 23 mg/dL   Creatinine, Ser 0.87 0.61 - 1.24 mg/dL   Calcium 10.2 8.9 - 10.3 mg/dL   Total Protein 8.1 6.5 - 8.1 g/dL   Albumin 2.5 (L) 3.5 - 5.0 g/dL   AST 45 (H) 15 - 41 U/L   ALT 39 0 - 44 U/L   Alkaline Phosphatase 87 38 - 126 U/L   Total Bilirubin 1.8 (H) 0.3 - 1.2 mg/dL   GFR calc non Af Amer >60 >60 mL/min   GFR calc Af Amer >60 >60 mL/min   Anion gap 10 5 - 15    Comment: Performed at [facility], [address],  [place], [place] [address]  CBC with Differential     Status: Abnormal   Collection Time: 12/14/18  8:24 PM  Result Value Ref Range   WBC 13.1 (H) 4.0 - 10.5 K/uL   RBC 6.37 (H) 4.22 - 5.81 MIL/uL   Hemoglobin 13.4 13.0 - 17.0 g/dL   HCT 40.4 39.0 - 52.0 %   MCV 63.4 (L) 80.0 - 100.0 fL   MCH 21.0 (L) 26.0 - 34.0 pg   MCHC 33.2 30.0 - 36.0 g/dL   RDW 16.9 (H) 11.5 - 15.5 %   Platelets 749 (H) 150 - 400 K/uL    Comment: REPEATED TO VERIFY   nRBC 0.0 0.0 - 0.2 %   Neutrophils Relative % 69 %   Neutro Abs 9.0 (H) 1.7 - 7.7 K/uL   Lymphocytes Relative 19 %   Lymphs Abs 2.5 0.7 - 4.0 K/uL   Monocytes Relative 8 %   Monocytes Absolute 1.0 0.1 - 1.0 K/uL   Eosinophils Relative 3 %   Eosinophils Absolute 0.4 0.0 - 0.5 K/uL   Basophils Relative 1 %   Basophils Absolute 0.1 0.0 - 0.1 K/uL   nRBC 0 0 /100 WBC   Abs Immature  Granulocytes 0.00 0.00 - 0.07 K/uL   Polychromasia PRESENT    Target Cells PRESENT     Comment: Performed at [facility], [place] [address], [place], [place] [address]  Lipase, blood     Status: None   Collection Time: 12/14/18  8:24 PM  Result Value Ref Range   Lipase 25 11 - 51 U/L    Comment: Performed at [facility] [address], [place], [place] [address]  Urinalysis, Routine w reflex microscopic     Status: Abnormal   Collection Time: 12/14/18  8:40 PM  Result Value Ref Range   Color, Urine AMBER (A) YELLOW    Comment: BIOCHEMICALS MAY BE AFFECTED BY COLOR   APPearance HAZY (A) CLEAR   Specific Gravity, Urine 1.021 1.005 - 1.030   pH 5.0 5.0 - 8.0   Glucose, UA NEGATIVE NEGATIVE mg/dL   Hgb urine dipstick NEGATIVE NEGATIVE   Bilirubin Urine NEGATIVE NEGATIVE   Ketones, ur NEGATIVE NEGATIVE mg/dL   Protein, ur NEGATIVE NEGATIVE mg/dL   Nitrite NEGATIVE NEGATIVE   Leukocytes,Ua NEGATIVE NEGATIVE    Comment: Performed at [facility] [address] [place], [place] [address]  I-stat chem 8, ed     Status: Abnormal   Collection Time: 12/14/18  8:51 PM  Result Value Ref Range   Sodium 135 135 - 145 mmol/L   Potassium 4.7 3.5 - 5.1 mmol/L   Chloride 101 98 - 111 mmol/L   BUN 12 8 - 23 mg/dL   Creatinine, Ser 0.80 0.61 - 1.24 mg/dL   Glucose, Bld 117 (H) 70 - 99 mg/dL   Calcium, Ion 1.26 1.15 - 1.40 mmol/L   TCO2 31 22 - 32 mmol/L   Hemoglobin 14.3 13.0 - 17.0 g/dL   HCT 42.0 39.0 - 52.0 %  SARS Coronavirus 2 (CEPHEID - Performed in [place] hospital lab), Hosp Order     Status: None   Collection Time: 12/15/18 12:22 AM   Specimen: Nasopharyngeal Swab  Result Value Ref Range   SARS Coronavirus 2 NEGATIVE NEGATIVE    Comment: (NOTE) If result is NEGATIVE SARS-CoV-2 target nucleic acids are NOT DETECTED. The SARS-CoV-2 RNA is generally detectable in upper and lower  respiratory specimens during the acute phase of  infection. The lowest   concentration of SARS-CoV-2 viral copies this assay can detect is 250  copies / mL. A negative result does not preclude SARS-CoV-2 infection  and should not be used as the sole basis for treatment or other  patient management decisions.  A negative result may occur with  improper specimen collection / handling, submission of specimen other  than nasopharyngeal swab, presence of viral mutation(s) within the  areas targeted by this assay, and inadequate number of viral copies  (<250 copies / mL). A negative result must be combined with clinical  observations, patient history, and epidemiological information. If result is POSITIVE SARS-CoV-2 target nucleic acids are DETECTED. The SARS-CoV-2 RNA is generally detectable in upper and lower  respiratory specimens dur ing the acute phase of infection.  Positive  results are indicative of active infection with SARS-CoV-2.  Clinical  correlation with patient history and other diagnostic information is  necessary to determine patient infection status.  Positive results do  not rule out bacterial infection or co-infection with other viruses. If result is PRESUMPTIVE POSTIVE SARS-CoV-2 nucleic acids MAY BE PRESENT.   A presumptive positive result was obtained on the submitted specimen  and confirmed on repeat testing.  While 2019 novel coronavirus  (SARS-CoV-2) nucleic acids may be present in the submitted sample  additional confirmatory testing may be necessary for epidemiological  and / or clinical management purposes  to differentiate between  SARS-CoV-2 and other Sarbecovirus currently known to infect humans.  If clinically indicated additional testing with an alternate test  methodology [phone]) is advised. The SARS-CoV-2 RNA is generally  detectable in upper and lower respiratory sp ecimens during the acute  phase of infection. The expected result is Negative. Fact Sheet for Patients:  StrictlyIdeas.no Fact Sheet  for Healthcare Providers: BankingDealers.co.za This test is not yet approved or cleared by the [place] FDA and has been authorized for detection and/or diagnosis of SARS-CoV-2 by FDA under an Emergency Use Authorization (EUA).  This EUA will remain in effect (meaning this test can be used) for the duration of the COVID-19 declaration under Section 564(b)(1) of the Act, 21 U.S.C. section 360bbb-3(b)(1), unless the authorization is terminated or revoked sooner. Performed at [facility], [place] [address] [place], [place] [address]    Ct Chest W Contrast  Result Date: 12/14/2018 CLINICAL DATA:  Lung mass EXAM: CT CHEST WITH CONTRAST TECHNIQUE: Multidetector CT imaging of the chest was performed during intravenous contrast administration. CONTRAST:  37mL OMNIPAQUE IOHEXOL 300 MG/ML  SOLN COMPARISON:  Abdominal CT earlier today FINDINGS: Cardiovascular: Heart is normal size. Aorta normal caliber. Coronary artery and aortic calcifications. Mediastinum/Nodes: Mediastinal and right hilar adenopathy. Low right paratracheal lymph node has a short axis diameter of 10 mm. Other similarly sized and smaller mediastinal lymph nodes. Right hilar adenopathy has short axis diameter of 13 mm. Lungs/Pleura: Large central right necrotic mass noted. There is postobstructive atelectasis or pneumonia in the right middle lobe as well as small right pleural effusion. The central right lung mass is low-density centrally suggesting necrosis. It is difficult to accurately measure due to postobstructive airspace disease but measures approximately 5.3 x 4.3 cm on image 91. No confluent opacity or effusion on the left. Linear atelectasis or scarring at the left base. Moderate emphysema. Upper Abdomen: Imaging into the upper abdomen shows no acute findings. Musculoskeletal: No acute bony abnormality. IMPRESSION: Large central right necrotic mass measuring approximately 5.3 x 4.3 cm concerning for  primary lung cancer. Postobstructive  atelectasis or pneumonia in the right middle lobe. Small right pleural effusion. Right hilar and mediastinal adenopathy. Coronary artery disease. Aortic Atherosclerosis (ICD10-I70.0) and Emphysema (ICD10-J43.9). Electronically Signed   By: Rolm Baptise M.D.   On: 12/14/2018 23:40   Ct Abdomen Pelvis W Contrast  Addendum Date: 12/14/2018   ADDENDUM REPORT: 12/14/2018 23:35 ADDENDUM: Additional images were added to the original study. Delayed sequences of the extreme inferior pelvis are submitted. Only the inferior bladder is included. There is dense excreted contrast in the bladder which could potentially obscure lesion. Partially visualized prostate appears enlarged. Electronically Signed   By: Donavan Foil M.D.   On: 12/14/2018 23:35   Result Date: 12/14/2018 CLINICAL DATA:  Groin pain EXAM: CT ABDOMEN AND PELVIS WITH CONTRAST TECHNIQUE: Multidetector CT imaging of the abdomen and pelvis was performed using the standard protocol following bolus administration of intravenous contrast. CONTRAST:  114mL OMNIPAQUE IOHEXOL 300 MG/ML  SOLN COMPARISON:  None FINDINGS: Lower chest: Small right-sided pleural effusion. Partial atelectasis or mild pneumonia in the right lung base. Incompletely visualized heterogenous masslike density in the right hilar to infrahilar lung with central low attenuation, possibly representing necrotic mass. This measures 4 cm transverse on coronal views, the cephalad extent is incompletely imaged. Postobstructive atelectasis or pneumonia in the right middle lobe. Abrupt occlusion of the right middle lobe bronchus. Heart size is normal. Hepatobiliary: No focal liver abnormality is seen. No gallstones, gallbladder wall thickening, or biliary dilatation. Pancreas: Unremarkable. No pancreatic ductal dilatation or surrounding inflammatory changes. Spleen: Normal in size without focal abnormality. Adrenals/Urinary Tract: Adrenal glands are normal. Kidneys are  within normal limits. No hydronephrosis. Bladder largely obscured by artifact from left hip hardware. Bladder may be thick-walled. Suspicion of polypoid mass either arising from prostate or the posterior wall of the bladder. Stomach/Bowel: The stomach is nonenlarged. No dilated small bowel. No bowel wall thickening. Negative appendix. Vascular/Lymphatic: Extensive aortic atherosclerosis without aneurysm. Heavily calcified distal common iliac, external iliac and internal iliac arteries with possible segmental occlusion of left proximal external iliac artery. No significantly enlarged lymph nodes. Reproductive: Prostate is likely enlarged. Possible polypoid projection off the anterior prostate. Other: Negative for free air or free fluid Musculoskeletal: Left hip replacement with artifact. Diffuse degenerative changes of the spine. No acute osseous abnormality. IMPRESSION: 1. Small right-sided pleural effusion. Atelectasis/consolidation of the right middle lobe with incompletely visualized heterogenous masslike density in the right hilar/infrahilar region with central low attenuation. Primary concern is for necrotic lung mass/carcinoma with postobstructive atelectasis or pneumonia of the right middle lobe. Dedicated chest CT, preferably with contrast, when clinically feasible is recommended. 2. Small right-sided pleural effusion with probable atelectasis at the right posterior lung base. 3. Suspicion of polypoid mass either arising from posterior wall of the bladder versus the prostate gland. Further characterisation limited due to artifact from left hip hardware. 4. 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 Electronically Signed: By: Donavan Foil M.D. On: 12/14/2018 22:04   US Scrotum W/doppler  Result Date: 12/14/2018 CLINICAL DATA:  78 year old male with testicular pain. EXAM: SCROTAL ULTRASOUND DOPPLER ULTRASOUND  OF THE TESTICLES TECHNIQUE: Complete ultrasound examination of the testicles, epididymis, and other scrotal structures was performed. Color and spectral Doppler ultrasound were also utilized to evaluate blood flow to the testicles. COMPARISON:  CT Abdomen and Pelvis today are reported separately. FINDINGS: Right testicle Measurements: 3.4 x 1.8 x 1.8 centimeters. Heterogeneous echotexture (image 2) but no discrete  testicular mass. Left testicle Measurements: 3.7 x 1.7 x 1.9 centimeters. Heterogeneous echotexture (image 26) but no discrete testicular mass. Right epididymis:  Normal in size and appearance. Left epididymis:  Could not be identified. Hydrocele:  None visualized. Varicocele:  None visualized. Pulsed Doppler interrogation of both testes demonstrates normal low resistance arterial and venous waveforms bilaterally. IMPRESSION: 1. No evidence of testicular torsion. 2. Symmetric heterogeneity of both testes with no discrete testicular mass. Electronically Signed   By: Genevie Ann M.D.   On: 12/14/2018 21:52    Pending Labs Unresulted Labs (From admission, onward)   None      Vitals/Pain Today's Vitals   12/14/18 2144 12/14/18 2200 12/14/18 2256 12/15/18 0100  BP: 125/71 130/69 130/69 (!) 115/59  Pulse: 84  84 78  Resp: 18  16 16   Temp:      TempSrc:      SpO2: 96%  96% 96%  PainSc: 0-No pain       Isolation Precautions No active isolations  Medications Medications  acetaminophen (TYLENOL) tablet 650 mg (has no administration in time range)  HYDROcodone-acetaminophen (NORCO/VICODIN) 5-325 MG per tablet 1 tablet (has no administration in time range)  morphine 4 MG/ML injection 3 mg (has no administration in time range)  iohexol (OMNIPAQUE) 300 MG/ML solution 100 mL (100 mLs Intravenous Contrast Given 12/14/18 2133)  sodium chloride 0.9 % bolus 1,000 mL (0 mLs Intravenous Stopped 12/15/18 0023)  iohexol (OMNIPAQUE) 300 MG/ML solution 75 mL (75 mLs Intravenous Contrast Given 12/14/18 2312)   cefTRIAXone (ROCEPHIN) 1 g in sodium chloride 0.9 % 100 mL IVPB (0 g Intravenous Stopped 12/15/18 0104)  azithromycin (ZITHROMAX) 500 mg in sodium chloride 0.9 % 250 mL IVPB (500 mg Intravenous New Bag/Given 12/15/18 0105)    Mobility walks Moderate fall risk   Focused Assessments Respiratory : WNL   R Recommendations: See Admitting Provider Note  Report given to:   Additional Notes:

## 2018-12-15 NOTE — Progress Notes (Signed)
  Echocardiogram 2D Echocardiogram has been performed.  Technically difficult study due to small rib spacing.  Richard [name] 12/15/2018, 12:49 PM

## 2018-12-15 NOTE — ED Provider Notes (Signed)
Patient here with weakness, cough, and groin pain.  Scrotal US shows no evidence of abscess or torsion.  Polypoid mass seen on CT.    CT of chest shows findings consistent with lung cancer and pneumonia.    Elevated white count.    Seen by and discussed with Dr. Maryan Rued.  Will cover for the pneumonia and consult for medicine for admission.   Montine Circle, PA-C 12/15/18 0301    Blanchie Dessert, MD 12/20/18 [phone]

## 2018-12-15 NOTE — ED Notes (Signed)
Pts daughter Shearon Stalls [phone]

## 2018-12-15 NOTE — ED Notes (Signed)
ED Provider at bedside. 

## 2018-12-15 NOTE — Consult Note (Signed)
NAMEHeinz [name], MRN:  [id number], DOB:  Jun 25, 1940, LOS: 0 ADMISSION DATE:  12/14/2018, CONSULTATION DATE:  12/15/2018 REFERRING MD:  Richard [name] CHIEF COMPLAINT:  Lung mass  Brief History   78 yo male PMH tobacco dependence, dementia, HTN, HLD admitted 12/14/18 /p presenting to ED with right groin pain. W/U included CT CAP which revealed a large necrotic central mass on the right concerning for primary lung cancer with postobstructive atelectasis or pneumonia and small pleural effusion as well as possible polypoid mass arising from the posterior aspect of the urinary bladder or the prostate. PCCM consulted for possible diagnostic EBUS of lung mass.   History of present illness   Information obtained from EMR and daughter Richard [name] by telephone.  Richard [name] is a 78 yo male who presented to ED 6/30 c/o right groin pain for approximately 1.5 weeks, poor PO intake, blood noted in underwear by family with increased urination and soiling of pull-ups, and unchanged chronic cough. There were no reported fever, chills, sore throat, N/V/D, chest pain, SOB, weight loss or bruising.   W/u significant for CT CPA which revealed above. He was admitted under [facility] services for post-obstructive pneumonia and placed on antibiotics.   Past Medical History  Tobacco dependence Dementia HTN HLD Remote h/o CAD and "enlarged heart" per daughter  Significant Hospital Events   N/A  Consults:  PCCM 7/1  Procedures:  N/A  Significant Diagnostic Tests:  CT CAP 6/30 "1. Small right-sided pleural effusion. Atelectasis/consolidation of the right middle lobe with incompletely visualized heterogenous masslike density in the right hilar/infrahilar region with central low attenuation. Primary concern is for necrotic lung mass/carcinoma with postobstructive atelectasis or pneumonia of the right middle lobe. Dedicated chest CT, preferably with contrast, when clinically feasible is recommended. 2. Small  right-sided pleural effusion with probable atelectasis at the right posterior lung base. 3. Suspicion of polypoid mass either arising from posterior wall of the bladder versus the prostate gland. Further characterisation limited due to artifact from left hip hardware. 4. 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  Objective   Blood pressure 113/63, pulse 77, temperature 97.9 F (36.6 C), temperature source Oral, resp. rate 18, weight 70.6 kg, SpO2 96 %.        Intake/Output Summary (Last 24 hours) at 12/15/2018 1020 Last data filed at 12/15/2018 0400 Gross per 24 hour  Intake 1350 ml  Output 150 ml  Net 1200 ml   Filed Weights   12/15/18 0401  Weight: 70.6 kg    Examination: General: well developed, thin, delightful and pleasantly confused male HENT: normocephalic, PERRL. MMM. Edentulous. Lungs: BBS +. Clear. FNL, symmetrical.  Cardiovascular: RRR. S1S2. No MRG. +2 distal pulses. Abdomen: Flat. + BS x4. SNT/ND. Extremities: MAE well. No edema. Neuro: A&Ox2 (baseline). No focal deficits Lymph: cervical, axillary or groin lymphadenopathy   Resolved Hospital Problem list   NA  Assessment & Plan:  Large lung mass concerning for lung cancer in need of diagnostic bronchoscopy.   Spoke with pt's daughter and POA, Richard [name], by phone. She will give consent for procedure when able to proceed.  P: Will plan for EBUS on 7/2 if able as waiting for out patient availability in setting of pandemic will delay work up.  Pt has already tested negative for COVID 19. -coags, CBC in am  -echo -no chemoprophylaxis /p midnight -NPO /p mn     Best practice:  Diet: per primary   Pain/Anxiety/Delirium protocol (if indicated): per primary VAP protocol (if indicated): not indicated DVT prophylaxis: SCDs. No chemo /p mn GI prophylaxis: not inidicated Glucose control: not indicated Mobility: per primary Code Status: FULL Family Communication: d/w daughter by phone Disposition: for diagnostic bronch  Labs   CBC: Recent Labs  Lab 12/14/18 2024 12/14/18 2051 12/15/18 0737  WBC 13.1*  --  12.1*  NEUTROABS 9.0*  --  8.3*  HGB 13.4 14.3 13.6  HCT 40.4 42.0 40.9  MCV 63.4*  --  63.0*  PLT 749*  --  716*    Basic Metabolic Panel: Recent Labs  Lab 12/14/18 2024 12/14/18 2051 12/15/18 0737  NA 136 135 138  K 4.6 4.7 3.9  CL 100 101 102  CO2 26  --  23  GLUCOSE 121* 117* 104*  BUN 10 12 7*  CREATININE 0.87 0.80 0.80  CALCIUM 10.2  --  9.6   GFR: CrCl cannot be calculated (Unknown ideal weight.). Recent Labs  Lab 12/14/18 2024 12/15/18 0737  PROCALCITON  --  <0.10  WBC 13.1* 12.1*    Liver Function Tests: Recent Labs  Lab 12/14/18 2024 12/15/18 0737  AST 45* 25  ALT 39 33  ALKPHOS 87 83  BILITOT 1.8* 0.6  PROT 8.1 7.5  ALBUMIN 2.5* 2.4*   Recent Labs  Lab 12/14/18 2024  LIPASE 25   No results for input(s): AMMONIA in the last 168 hours.  ABG    Component Value Date/Time   TCO2 31 12/14/2018 2051     Coagulation Profile: No results for input(s): INR, PROTIME in the last 168 hours.  Cardiac Enzymes: No results for input(s): CKTOTAL, CKMB, CKMBINDEX, TROPONINI in the last 168 hours.  HbA1C: No results found for: HGBA1C  CBG: No results for input(s): GLUCAP in the last 168 hours.  Review of Systems:   Complete and negative except as noted in HPI.   Past Medical History  He,  has a past medical history of Dementia (Richard [name]), High cholesterol, and Hypertension.   Surgical History    Past Surgical History:  Procedure Laterality Date  . JOINT REPLACEMENT       Social History   Pt reports that he has been smoking  cigarettes since he was 58 yoa. He has been smoking about 0.25 packs per day. He has never used smokeless tobacco. He reports current alcohol use. He reports that he does not use drugs.   Family History   7 brothers Twin brother just died of lung cancer on 12/20/2018 Another brother had lung cancer and had lung removed and recovered Grandmother had cancer but died of other causes at 6 yoa   Allergies No Known Allergies   Home Medications  Prior to Admission medications   Medication Sig Start Date End Date Taking? Authorizing Provider  acetaminophen (TYLENOL) 500 MG tablet Take 2 tablets (1,000 mg total) by mouth every 8 (eight) hours as needed for mild pain. Patient not taking: Reported on 03/18/2018 11/26/16   Vanessa Kick, MD  aspirin (ASPIRIN 81) 81 MG EC tablet Take 1 tablet (81 mg total) by mouth daily. 03/18/18   Clent Demark, PA-C  atorvastatin (LIPITOR) 20 MG tablet Take 1 tablet (20 mg total) by mouth every evening. 09/06/18   [name], NP  hydrochlorothiazide (HYDRODIURIL) 12.5 MG  tablet Take 1 tablet by mouth once daily 10/12/18   [name], NP  losartan (COZAAR) 50 MG tablet Take 1 tablet (50 mg total) by mouth daily. 09/06/18   [name], NP  meloxicam (MOBIC) 7.5 MG tablet Take 1 tablet (7.5 mg total) by mouth daily. 03/18/18   Clent Demark, PA-C     Francine Graven, MSN, [facility]

## 2018-12-15 NOTE — Progress Notes (Addendum)
Patient admitted after midnight.  Here with groin pain. ER work up revealed a lung mass.  Patient still smoking per daughter.  He has dementia and is at his baseline.  Pulm consult for consideration of EBUS- in hospital  Due to his new large lung mass and dementia. [name] DO

## 2018-12-15 NOTE — H&P (Signed)
History and Physical    Machai Desmith ERX:[id number] DOB: January 15, 1941 DOA: 12/14/2018  PCP: Clent Demark, PA-C   Patient coming from: Home   Chief Complaint: Groin pain, not eating or drinking, blood in underwear   HPI: Hue Lagrange is a 78 y.o. male with medical history significant for tobacco abuse, dementia, hypertension, and hyperlipidemia, presenting to the emergency department for evaluation of groin pain.  Patient was accompanied by family who provides most of the history, reporting that patient has not been eating or drinking much of anything recently despite encouragement to do so.  He has been complaining of pain in the right groin for roughly 1 week now but his family look at it.  Family noted some red blood in his underwear recently.  Patient is said to have a chronic cough but this does not seem to have changed much.  There has not been any recent fevers or chills reported.  ED Course: Upon arrival to the ED, patient is found to be afebrile, saturating mid 90s on room air, and with remaining vitals also stable.  Chemistry panel features a slight elevation in AST and total bilirubin.  CBC is notable for leukocytosis and thrombocytosis.  COVID-19 is negative and fecal occult blood testing is negative.  CT abdomen and pelvis is concerning for possible polypoid mass arising from the posterior aspect of the urinary bladder or the prostate.  CT chest reveals a large necrotic central mass on the right concerning for primary lung cancer with postobstructive atelectasis or pneumonia and small pleural effusion.  Patient was given a liter of normal saline and started on Rocephin and azithromycin in the emergency department, and the hospitalist service is asked to admit for pneumonia.  Review of Systems:  Unable to complete ROS secondary to the patient's clinical condition.  Past Medical History:  Diagnosis Date   High cholesterol    Hypertension     Past Surgical History:  Procedure  Laterality Date   JOINT REPLACEMENT       reports that he has been smoking cigarettes. He has been smoking about 0.25 packs per day. He has never used smokeless tobacco. He reports current alcohol use. He reports that he does not use drugs.  No Known Allergies  History reviewed. No pertinent family history.   Prior to Admission medications   Medication Sig Start Date End Date Taking? Authorizing Provider  acetaminophen (TYLENOL) 500 MG tablet Take 2 tablets (1,000 mg total) by mouth every 8 (eight) hours as needed for mild pain. Patient not taking: Reported on 03/18/2018 11/26/16   Vanessa Kick, MD  aspirin (ASPIRIN 81) 81 MG EC tablet Take 1 tablet (81 mg total) by mouth daily. 03/18/18   Clent Demark, PA-C  atorvastatin (LIPITOR) 20 MG tablet Take 1 tablet (20 mg total) by mouth every evening. 09/06/18   [name], NP  hydrochlorothiazide (HYDRODIURIL) 12.5 MG tablet Take 1 tablet by mouth once daily 10/12/18   [name], NP  losartan (COZAAR) 50 MG tablet Take 1 tablet (50 mg total) by mouth daily. 09/06/18   [name], NP  meloxicam (MOBIC) 7.5 MG tablet Take 1 tablet (7.5 mg total) by mouth daily. 03/18/18   Clent Demark, PA-C    Physical Exam: Vitals:   12/14/18 2200 12/14/18 2256 12/15/18 0100 12/15/18 0300  BP: 130/69 130/69 (!) 115/59 129/69  Pulse:  84 78   Resp:  16 16   Temp:      TempSrc:  SpO2:  96% 96%     Constitutional: NAD, calm  Eyes: PERTLA, lids and conjunctivae normal ENMT: Mucous membranes are moist. Posterior pharynx clear of any exudate or lesions.   Neck: normal, supple, no masses, no thyromegaly Respiratory: no wheezing, no crackles. No accessory muscle use.  Cardiovascular: S1 & S2 heard, regular rate and rhythm. No extremity edema.   Abdomen: No distension, no tenderness, soft. Bowel sounds active.  Musculoskeletal: no clubbing / cyanosis. No joint deformity upper and lower extremities.    Skin: no  significant rashes, lesions, ulcers. Warm, dry, well-perfused. Neurologic: No gross facial asymmetry. Sensation intact. Moving all extremities.  Psychiatric: Alert and oriented to person and place only. Very pleasant, cooperative.    Labs on Admission: I have personally reviewed following labs and imaging studies  CBC: Recent Labs  Lab 12/14/18 2024 12/14/18 2051  WBC 13.1*  --   NEUTROABS 9.0*  --   HGB 13.4 14.3  HCT 40.4 42.0  MCV 63.4*  --   PLT 749*  --    Basic Metabolic Panel: Recent Labs  Lab 12/14/18 2024 12/14/18 2051  NA 136 135  K 4.6 4.7  CL 100 101  CO2 26  --   GLUCOSE 121* 117*  BUN 10 12  CREATININE 0.87 0.80  CALCIUM 10.2  --    GFR: CrCl cannot be calculated (Unknown ideal weight.). Liver Function Tests: Recent Labs  Lab 12/14/18 2024  AST 45*  ALT 39  ALKPHOS 87  BILITOT 1.8*  PROT 8.1  ALBUMIN 2.5*   Recent Labs  Lab 12/14/18 2024  LIPASE 25   No results for input(s): AMMONIA in the last 168 hours. Coagulation Profile: No results for input(s): INR, PROTIME in the last 168 hours. Cardiac Enzymes: No results for input(s): CKTOTAL, CKMB, CKMBINDEX, TROPONINI in the last 168 hours. BNP (last 3 results) No results for input(s): PROBNP in the last 8760 hours. HbA1C: No results for input(s): HGBA1C in the last 72 hours. CBG: No results for input(s): GLUCAP in the last 168 hours. Lipid Profile: No results for input(s): CHOL, HDL, LDLCALC, TRIG, CHOLHDL, LDLDIRECT in the last 72 hours. Thyroid Function Tests: No results for input(s): TSH, T4TOTAL, FREET4, T3FREE, THYROIDAB in the last 72 hours. Anemia Panel: No results for input(s): VITAMINB12, FOLATE, FERRITIN, TIBC, IRON, RETICCTPCT in the last 72 hours. Urine analysis:    Component Value Date/Time   COLORURINE AMBER (A) 12/14/2018 2040   APPEARANCEUR HAZY (A) 12/14/2018 2040   LABSPEC 1.021 12/14/2018 2040   PHURINE 5.0 12/14/2018 2040   GLUCOSEU NEGATIVE 12/14/2018 2040    HGBUR NEGATIVE 12/14/2018 2040   BILIRUBINUR NEGATIVE 12/14/2018 2040   KETONESUR NEGATIVE 12/14/2018 2040   PROTEINUR NEGATIVE 12/14/2018 2040   NITRITE NEGATIVE 12/14/2018 2040   LEUKOCYTESUR NEGATIVE 12/14/2018 2040   Sepsis Labs: @LABRCNTIP (procalcitonin:4,lacticidven:4) ) Recent Results (from the past 240 hour(s))  SARS Coronavirus 2 (CEPHEID - Performed in [place] hospital lab), Hosp Order     Status: None   Collection Time: 12/15/18 12:22 AM   Specimen: Nasopharyngeal Swab  Result Value Ref Range Status   SARS Coronavirus 2 NEGATIVE NEGATIVE Final    Comment: (NOTE) If result is NEGATIVE SARS-CoV-2 target nucleic acids are NOT DETECTED. The SARS-CoV-2 RNA is generally detectable in upper and lower  respiratory specimens during the acute phase of infection. The lowest  concentration of SARS-CoV-2 viral copies this assay can detect is 250  copies / mL. A negative result does not preclude SARS-CoV-2 infection  and should not be used as the sole basis for treatment or other  patient management decisions.  A negative result may occur with  improper specimen collection / handling, submission of specimen other  than nasopharyngeal swab, presence of viral mutation(s) within the  areas targeted by this assay, and inadequate number of viral copies  (<250 copies / mL). A negative result must be combined with clinical  observations, patient history, and epidemiological information. If result is POSITIVE SARS-CoV-2 target nucleic acids are DETECTED. The SARS-CoV-2 RNA is generally detectable in upper and lower  respiratory specimens dur ing the acute phase of infection.  Positive  results are indicative of active infection with SARS-CoV-2.  Clinical  correlation with patient history and other diagnostic information is  necessary to determine patient infection status.  Positive results do  not rule out bacterial infection or co-infection with other viruses. If result is  PRESUMPTIVE POSTIVE SARS-CoV-2 nucleic acids MAY BE PRESENT.   A presumptive positive result was obtained on the submitted specimen  and confirmed on repeat testing.  While 2019 novel coronavirus  (SARS-CoV-2) nucleic acids may be present in the submitted sample  additional confirmatory testing may be necessary for epidemiological  and / or clinical management purposes  to differentiate between  SARS-CoV-2 and other Sarbecovirus currently known to infect humans.  If clinically indicated additional testing with an alternate test  methodology [phone]) is advised. The SARS-CoV-2 RNA is generally  detectable in upper and lower respiratory sp ecimens during the acute  phase of infection. The expected result is Negative. Fact Sheet for Patients:  StrictlyIdeas.no Fact Sheet for Healthcare Providers: BankingDealers.co.za This test is not yet approved or cleared by the [place] FDA and has been authorized for detection and/or diagnosis of SARS-CoV-2 by FDA under an Emergency Use Authorization (EUA).  This EUA will remain in effect (meaning this test can be used) for the duration of the COVID-19 declaration under Section 564(b)(1) of the Act, 21 U.S.C. section 360bbb-3(b)(1), unless the authorization is terminated or revoked sooner. Performed at [facility], [place] [address], [place], [place] [address]      Radiological Exams on Admission: Ct Chest W Contrast  Result Date: 12/14/2018 CLINICAL DATA:  Lung mass EXAM: CT CHEST WITH CONTRAST TECHNIQUE: Multidetector CT imaging of the chest was performed during intravenous contrast administration. CONTRAST:  16mL OMNIPAQUE IOHEXOL 300 MG/ML  SOLN COMPARISON:  Abdominal CT earlier today FINDINGS: Cardiovascular: Heart is normal size. Aorta normal caliber. Coronary artery and aortic calcifications. Mediastinum/Nodes: Mediastinal and right hilar adenopathy. Low right paratracheal lymph node has  a short axis diameter of 10 mm. Other similarly sized and smaller mediastinal lymph nodes. Right hilar adenopathy has short axis diameter of 13 mm. Lungs/Pleura: Large central right necrotic mass noted. There is postobstructive atelectasis or pneumonia in the right middle lobe as well as small right pleural effusion. The central right lung mass is low-density centrally suggesting necrosis. It is difficult to accurately measure due to postobstructive airspace disease but measures approximately 5.3 x 4.3 cm on image 91. No confluent opacity or effusion on the left. Linear atelectasis or scarring at the left base. Moderate emphysema. Upper Abdomen: Imaging into the upper abdomen shows no acute findings. Musculoskeletal: No acute bony abnormality. IMPRESSION: Large central right necrotic mass measuring approximately 5.3 x 4.3 cm concerning for primary lung cancer. Postobstructive atelectasis or pneumonia in the right middle lobe. Small right pleural effusion. Right hilar and mediastinal adenopathy. Coronary artery disease. Aortic Atherosclerosis (ICD10-I70.0)  and Emphysema (ICD10-J43.9). Electronically Signed   By: Rolm Baptise M.D.   On: 12/14/2018 23:40   Ct Abdomen Pelvis W Contrast  Addendum Date: 12/14/2018   ADDENDUM REPORT: 12/14/2018 23:35 ADDENDUM: Additional images were added to the original study. Delayed sequences of the extreme inferior pelvis are submitted. Only the inferior bladder is included. There is dense excreted contrast in the bladder which could potentially obscure lesion. Partially visualized prostate appears enlarged. Electronically Signed   By: Donavan Foil M.D.   On: 12/14/2018 23:35   Result Date: 12/14/2018 CLINICAL DATA:  Groin pain EXAM: CT ABDOMEN AND PELVIS WITH CONTRAST TECHNIQUE: Multidetector CT imaging of the abdomen and pelvis was performed using the standard protocol following bolus administration of intravenous contrast. CONTRAST:  17mL OMNIPAQUE IOHEXOL 300 MG/ML  SOLN  COMPARISON:  None FINDINGS: Lower chest: Small right-sided pleural effusion. Partial atelectasis or mild pneumonia in the right lung base. Incompletely visualized heterogenous masslike density in the right hilar to infrahilar lung with central low attenuation, possibly representing necrotic mass. This measures 4 cm transverse on coronal views, the cephalad extent is incompletely imaged. Postobstructive atelectasis or pneumonia in the right middle lobe. Abrupt occlusion of the right middle lobe bronchus. Heart size is normal. Hepatobiliary: No focal liver abnormality is seen. No gallstones, gallbladder wall thickening, or biliary dilatation. Pancreas: Unremarkable. No pancreatic ductal dilatation or surrounding inflammatory changes. Spleen: Normal in size without focal abnormality. Adrenals/Urinary Tract: Adrenal glands are normal. Kidneys are within normal limits. No hydronephrosis. Bladder largely obscured by artifact from left hip hardware. Bladder may be thick-walled. Suspicion of polypoid mass either arising from prostate or the posterior wall of the bladder. Stomach/Bowel: The stomach is nonenlarged. No dilated small bowel. No bowel wall thickening. Negative appendix. Vascular/Lymphatic: Extensive aortic atherosclerosis without aneurysm. Heavily calcified distal common iliac, external iliac and internal iliac arteries with possible segmental occlusion of left proximal external iliac artery. No significantly enlarged lymph nodes. Reproductive: Prostate is likely enlarged. Possible polypoid projection off the anterior prostate. Other: Negative for free air or free fluid Musculoskeletal: Left hip replacement with artifact. Diffuse degenerative changes of the spine. No acute osseous abnormality. IMPRESSION: 1. Small right-sided pleural effusion. Atelectasis/consolidation of the right middle lobe with incompletely visualized heterogenous masslike density in the right hilar/infrahilar region with central low  attenuation. Primary concern is for necrotic lung mass/carcinoma with postobstructive atelectasis or pneumonia of the right middle lobe. Dedicated chest CT, preferably with contrast, when clinically feasible is recommended. 2. Small right-sided pleural effusion with probable atelectasis at the right posterior lung base. 3. Suspicion of polypoid mass either arising from posterior wall of the bladder versus the prostate gland. Further characterisation limited due to artifact from left hip hardware. 4. 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 Electronically Signed: By: Donavan Foil M.D. On: 12/14/2018 22:04   US Scrotum W/doppler  Result Date: 12/14/2018 CLINICAL DATA:  78 year old male with testicular pain. EXAM: SCROTAL ULTRASOUND DOPPLER ULTRASOUND OF THE TESTICLES TECHNIQUE: Complete ultrasound examination of the testicles, epididymis, and other scrotal structures was performed. Color and spectral Doppler ultrasound were also utilized to evaluate blood flow to the testicles. COMPARISON:  CT Abdomen and Pelvis today are reported separately. FINDINGS: Right testicle Measurements: 3.4 x 1.8 x 1.8 centimeters. Heterogeneous echotexture (image 2) but no discrete testicular mass. Left testicle Measurements: 3.7 x 1.7 x 1.9 centimeters. Heterogeneous echotexture (image 26) but no discrete testicular mass. Right epididymis:  Normal in size and appearance. Left epididymis:  Could not be identified. Hydrocele:  None visualized. Varicocele:  None visualized. Pulsed Doppler interrogation of both testes demonstrates normal low resistance arterial and venous waveforms bilaterally. IMPRESSION: 1. No evidence of testicular torsion. 2. Symmetric heterogeneity of both testes with no discrete testicular mass. Electronically Signed   By: Genevie Ann M.D.   On: 12/14/2018 21:52    EKG: Not performed.    Assessment/Plan   1. Lung  mass; ?postobstructive PNA  - Presents with insidiously worsening anorexia and a week of right groin pain, and is found to have a large necrotic mass on the right concerning for primary lung cancer with postobstructive atelectasis or PNA  - He was treated with Rocephin and azithromycin in ED  - Continue empiric antibiotics for now while checking sputum culture, strep pneumo antigen, and procalcitonin; further workup of mass can likely be performed outpatient    2. Bladder mass - Presents with ~1 wk of pain in right groin, possible gross hematuria per family; ED workup concerning for polypoid mass arising from posterior wall or urinary bladder or prostate  - Outpatient urology evaluation recommended   3. Hypertension  - BP is at goal  - Pharmacy med-rec is pending    PPE: mask, face shield  DVT prophylaxis: Lovenox  Code Status: Full  Family Communication: Discussed with patient  Consults called: None Admission status: Observation     Vianne Bulls, MD [facility] Pager [phone]  If 7PM-7AM, please contact night-coverage [email or web address] Password [email or web address]  12/15/2018, 3:30 AM

## 2018-12-16 ENCOUNTER — Inpatient Hospital Stay (HOSPITAL_COMMUNITY): Payer: Medicare HMO | Admitting: Certified Registered"

## 2018-12-16 ENCOUNTER — Inpatient Hospital Stay (HOSPITAL_COMMUNITY): Payer: Medicare HMO

## 2018-12-16 ENCOUNTER — Encounter (HOSPITAL_COMMUNITY): Payer: Self-pay

## 2018-12-16 ENCOUNTER — Encounter (HOSPITAL_COMMUNITY)
Admission: EM | Disposition: A | Discharge status: Home Health | Payer: Self-pay | Source: Home / Self Care | Attending: Internal Medicine

## 2018-12-16 DIAGNOSIS — F0390 Unspecified dementia without behavioral disturbance: Secondary | ICD-10-CM

## 2018-12-16 HISTORY — PX: VIDEO BRONCHOSCOPY WITH ENDOBRONCHIAL ULTRASOUND: SHX6177

## 2018-12-16 LAB — CBC
HCT: 37.8 % — ABNORMAL LOW (ref 39.0–52.0)
Hemoglobin: 12.6 g/dL — ABNORMAL LOW (ref 13.0–17.0)
MCH: 21.1 pg — ABNORMAL LOW (ref 26.0–34.0)
MCHC: 33.3 g/dL (ref 30.0–36.0)
MCV: 63.3 fL — ABNORMAL LOW (ref 80.0–100.0)
Platelets: 695 10*3/uL — ABNORMAL HIGH (ref 150–400)
RBC: 5.97 MIL/uL — ABNORMAL HIGH (ref 4.22–5.81)
RDW: 16.1 % — ABNORMAL HIGH (ref 11.5–15.5)
WBC: 10.8 10*3/uL — ABNORMAL HIGH (ref 4.0–10.5)
nRBC: 0 % (ref 0.0–0.2)

## 2018-12-16 LAB — APTT: aPTT: 34 seconds (ref 24–36)

## 2018-12-16 LAB — PROCALCITONIN: Procalcitonin: <0.1 ng/mL

## 2018-12-16 LAB — PROTIME-INR
INR: 1.2 (ref 0.8–1.2)
Prothrombin Time: 15.1 seconds (ref 11.4–15.2)

## 2018-12-16 SURGERY — BRONCHOSCOPY, WITH EBUS
Anesthesia: General

## 2018-12-16 MED ORDER — SODIUM CHLORIDE 0.9 % IV SOLN
INTRAVENOUS | Status: DC | PRN
  Administered 2018-12-16: 40 ug/min via INTRAVENOUS

## 2018-12-16 MED ORDER — ETOMIDATE 2 MG/ML IV SOLN
INTRAVENOUS | Status: DC | PRN
  Administered 2018-12-16: 16 mg via INTRAVENOUS

## 2018-12-16 MED ORDER — ONDANSETRON HCL 4 MG/2ML IJ SOLN
INTRAMUSCULAR | Status: DC | PRN
  Administered 2018-12-16: 4 mg via INTRAVENOUS

## 2018-12-16 MED ORDER — LIDOCAINE 2% (20 MG/ML) 5 ML SYRINGE
INTRAMUSCULAR | Status: DC | PRN
  Administered 2018-12-16: 80 mg via INTRAVENOUS

## 2018-12-16 MED ORDER — 0.9 % SODIUM CHLORIDE (POUR BTL) OPTIME
TOPICAL | Status: DC | PRN
  Administered 2018-12-16: 1000 mL

## 2018-12-16 MED ORDER — LIDOCAINE 2% (20 MG/ML) 5 ML SYRINGE
INTRAMUSCULAR | Status: AC
  Filled 2018-12-16: qty 15

## 2018-12-16 MED ORDER — SUGAMMADEX SODIUM 200 MG/2ML IV SOLN
INTRAVENOUS | Status: DC | PRN
  Administered 2018-12-16: 180 mg via INTRAVENOUS

## 2018-12-16 MED ORDER — FENTANYL CITRATE (PF) 100 MCG/2ML IJ SOLN: 25.0000 ug | INTRAMUSCULAR | Status: DC | PRN

## 2018-12-16 MED ORDER — LACTATED RINGERS IV SOLN
INTRAVENOUS | Status: DC
  Administered 2018-12-16: 18:00:00 via INTRAVENOUS

## 2018-12-16 MED ORDER — ROCURONIUM BROMIDE 10 MG/ML (PF) SYRINGE
PREFILLED_SYRINGE | INTRAVENOUS | Status: AC
  Filled 2018-12-16: qty 20

## 2018-12-16 MED ORDER — SUCCINYLCHOLINE CHLORIDE 200 MG/10ML IV SOSY
PREFILLED_SYRINGE | INTRAVENOUS | Status: AC
  Filled 2018-12-16: qty 20

## 2018-12-16 MED ORDER — LACTATED RINGERS IV SOLN
INTRAVENOUS | Status: DC
  Administered 2018-12-16: 11:00:00 via INTRAVENOUS

## 2018-12-16 MED ORDER — HEPARIN SODIUM (PORCINE) 1000 UNIT/ML IJ SOLN
INTRAMUSCULAR | Status: AC
  Filled 2018-12-16: qty 1

## 2018-12-16 MED ORDER — FENTANYL CITRATE (PF) 100 MCG/2ML IJ SOLN
INTRAMUSCULAR | Status: AC
  Filled 2018-12-16: qty 2

## 2018-12-16 MED ORDER — EPINEPHRINE PF 1 MG/ML IJ SOLN
INTRAMUSCULAR | Status: DC | PRN
  Administered 2018-12-16: 1 mg via ENDOTRACHEOPULMONARY

## 2018-12-16 MED ORDER — DEXAMETHASONE SODIUM PHOSPHATE 10 MG/ML IJ SOLN
INTRAMUSCULAR | Status: DC | PRN
  Administered 2018-12-16: 10 mg via INTRAVENOUS

## 2018-12-16 MED ORDER — PHENYLEPHRINE 40 MCG/ML (10ML) SYRINGE FOR IV PUSH (FOR BLOOD PRESSURE SUPPORT)
PREFILLED_SYRINGE | INTRAVENOUS | Status: DC | PRN
  Administered 2018-12-16: 80 ug via INTRAVENOUS
  Administered 2018-12-16: 120 ug via INTRAVENOUS

## 2018-12-16 MED ORDER — FENTANYL CITRATE (PF) 100 MCG/2ML IJ SOLN
INTRAMUSCULAR | Status: DC | PRN
  Administered 2018-12-16 (×2): 50 ug via INTRAVENOUS

## 2018-12-16 MED ORDER — PHENYLEPHRINE 40 MCG/ML (10ML) SYRINGE FOR IV PUSH (FOR BLOOD PRESSURE SUPPORT)
PREFILLED_SYRINGE | INTRAVENOUS | Status: AC
  Filled 2018-12-16: qty 20

## 2018-12-16 MED ORDER — ONDANSETRON HCL 4 MG/2ML IJ SOLN
INTRAMUSCULAR | Status: AC
  Filled 2018-12-16: qty 4

## 2018-12-16 MED ORDER — EPINEPHRINE PF 1 MG/ML IJ SOLN
INTRAMUSCULAR | Status: AC
  Filled 2018-12-16: qty 1

## 2018-12-16 MED ORDER — GLYCOPYRROLATE PF 0.2 MG/ML IJ SOSY
PREFILLED_SYRINGE | INTRAMUSCULAR | Status: AC
  Filled 2018-12-16: qty 2

## 2018-12-16 MED ORDER — ROCURONIUM BROMIDE 10 MG/ML (PF) SYRINGE
PREFILLED_SYRINGE | INTRAVENOUS | Status: DC | PRN
  Administered 2018-12-16: 30 mg via INTRAVENOUS
  Administered 2018-12-16: 10 mg via INTRAVENOUS

## 2018-12-16 MED ORDER — DEXAMETHASONE SODIUM PHOSPHATE 10 MG/ML IJ SOLN
INTRAMUSCULAR | Status: AC
  Filled 2018-12-16: qty 1

## 2018-12-16 SURGICAL SUPPLY — 34 items
ADAPTER VALVE BIOPSY EBUS (MISCELLANEOUS) IMPLANT
ADPTR VALVE BIOPSY EBUS (MISCELLANEOUS) ×1
BRUSH CYTOL CELLEBRITY 1.5X140 (MISCELLANEOUS) ×1 IMPLANT
CANISTER SUCT 3000ML PPV (MISCELLANEOUS) ×2 IMPLANT
CONT SPEC 4OZ CLIKSEAL STRL BL (MISCELLANEOUS) ×2 IMPLANT
COVER BACK TABLE 60X90IN (DRAPES) ×2 IMPLANT
COVER DOME SNAP 22 D (MISCELLANEOUS) ×2 IMPLANT
FILTER STRAW FLUID ASPIR (MISCELLANEOUS) ×1 IMPLANT
FORCEPS BIOP RJ4 1.8 (CUTTING FORCEPS) ×1 IMPLANT
GAUZE SPONGE 4X4 12PLY STRL (GAUZE/BANDAGES/DRESSINGS) ×2 IMPLANT
GLOVE BIO SURGEON STRL SZ7.5 (GLOVE) ×2 IMPLANT
GOWN STRL REUS W/ TWL LRG LVL3 (GOWN DISPOSABLE) ×2 IMPLANT
GOWN STRL REUS W/TWL LRG LVL3 (GOWN DISPOSABLE) ×2
KIT CLEAN ENDO COMPLIANCE (KITS) ×4 IMPLANT
KIT TURNOVER KIT B (KITS) ×2 IMPLANT
MARKER SKIN DUAL TIP RULER LAB (MISCELLANEOUS) ×2 IMPLANT
NDL ASPIRATION VIZISHOT 19G (NEEDLE) IMPLANT
NDL ASPIRATION VIZISHOT 21G (NEEDLE) ×1 IMPLANT
NEEDLE ASPIRATION VIZISHOT 19G (NEEDLE) IMPLANT
NEEDLE ASPIRATION VIZISHOT 21G (NEEDLE) ×4 IMPLANT
NS IRRIG 1000ML POUR BTL (IV SOLUTION) ×2 IMPLANT
OIL SILICONE PENTAX (PARTS (SERVICE/REPAIRS)) ×2 IMPLANT
PAD ARMBOARD 7.5X6 YLW CONV (MISCELLANEOUS) ×4 IMPLANT
SYR 20CC LL (SYRINGE) IMPLANT
SYR 20ML ECCENTRIC (SYRINGE) ×7 IMPLANT
SYR 3ML LL SCALE MARK (SYRINGE) ×1 IMPLANT
SYR 5ML LUER SLIP (SYRINGE) ×2 IMPLANT
TOWEL GREEN STERILE FF (TOWEL DISPOSABLE) ×2 IMPLANT
TRAP SPECIMEN MUCOUS 40CC (MISCELLANEOUS) IMPLANT
TUBE CONNECTING 20X1/4 (TUBING) ×2 IMPLANT
VALVE BIOPSY  SINGLE USE (MISCELLANEOUS) ×2
VALVE BIOPSY SINGLE USE (MISCELLANEOUS) ×1 IMPLANT
VALVE SUCTION BRONCHIO DISP (MISCELLANEOUS) ×2 IMPLANT
WATER STERILE IRR 1000ML POUR (IV SOLUTION) ×2 IMPLANT

## 2018-12-16 NOTE — Transfer of Care (Signed)
Immediate Anesthesia Transfer of Care Note  Patient: Richard [name]  Procedure(s) Performed: VIDEO BRONCHOSCOPY WITH ENDOBRONCHIAL ULTRASOUND AND FLUROSCOPY (N/A )  Patient Location: PACU  Anesthesia Type:General  Level of Consciousness: awake, alert , oriented and patient cooperative  Airway & Oxygen Therapy: Patient Spontanous Breathing and Patient connected to face mask oxygen  Post-op Assessment: Report given to RN and Post -op Vital signs reviewed and stable  Post vital signs: Reviewed and stable  Last Vitals:  Vitals Value Taken Time  BP 116/52 12/16/18 1434  Temp    Pulse 79 12/16/18 1438  Resp 19 12/16/18 1438  SpO2 97 % 12/16/18 1438  Vitals shown include unvalidated device data.  Last Pain:  Vitals:   12/16/18 1036  TempSrc:   PainSc: 0-No pain         Complications: No apparent anesthesia complications

## 2018-12-16 NOTE — Anesthesia Preprocedure Evaluation (Signed)
Anesthesia Evaluation  Patient identified by MRN, date of birth, ID band Patient awake    Reviewed: Allergy & Precautions, NPO status , Patient's Chart, lab work & pertinent test results  Airway Mallampati: III  TM Distance: >3 FB     Dental   Pulmonary Current Smoker,    breath sounds clear to auscultation       Cardiovascular hypertension,  Rhythm:Regular Rate:Normal     Neuro/Psych    GI/Hepatic negative GI ROS, Neg liver ROS,   Endo/Other  negative endocrine ROS  Renal/GU negative Renal ROS     Musculoskeletal   Abdominal   Peds  Hematology   Anesthesia Other Findings   Reproductive/Obstetrics                             Anesthesia Physical Anesthesia Plan  ASA: III  Anesthesia Plan: General   Post-op Pain Management:    Induction: Intravenous  PONV Risk Score and Plan: 1 and Treatment may vary due to age or medical condition  Airway Management Planned: Oral ETT  Additional Equipment:   Intra-op Plan:   Post-operative Plan: Possible Post-op intubation/ventilation  Informed Consent: I have reviewed the patients History and Physical, chart, labs and discussed the procedure including the risks, benefits and alternatives for the proposed anesthesia with the patient or authorized representative who has indicated his/her understanding and acceptance.     Dental advisory given  Plan Discussed with: Anesthesiologist and CRNA  Anesthesia Plan Comments:         Anesthesia Quick Evaluation

## 2018-12-16 NOTE — Anesthesia Procedure Notes (Signed)
Procedure Name: Intubation Date/Time: 12/16/2018 12:23 PM Performed by: Cleda Daub, CRNA Pre-anesthesia Checklist: Patient identified, Emergency Drugs available, Suction available and Patient being monitored Patient Re-evaluated:Patient Re-evaluated prior to induction Oxygen Delivery Method: Circle system utilized Preoxygenation: Pre-oxygenation with 100% oxygen Induction Type: IV induction and Rapid sequence Laryngoscope Size: Mac and 4 Grade View: Grade I Tube size: 8.5 mm Number of attempts: 1 Airway Equipment and Method: Stylet Placement Confirmation: ETT inserted through vocal cords under direct vision,  positive ETCO2 and breath sounds checked- equal and bilateral Secured at: 24 cm Tube secured with: Tape Dental Injury: Teeth and Oropharynx as per pre-operative assessment

## 2018-12-16 NOTE — Progress Notes (Addendum)
   Richard [name], MRN:  [id number], DOB:  1941/03/29, LOS: 1 ADMISSION DATE:  12/14/2018, CONSULTATION DATE:  12/15/2018 REFERRING MD:  Eliseo Squires CHIEF COMPLAINT:  Lung mass  Brief History   78 yo male PMH tobacco dependence, dementia, HTN, HLD admitted 12/14/18 /p presenting to ED with right groin pain. W/U included CT CAP which revealed a large necrotic central mass on the right concerning for primary lung cancer with postobstructive atelectasis or pneumonia and small pleural effusion as well as possible polypoid mass arising from the posterior aspect of the urinary bladder or the prostate. PCCM consulted for possible diagnostic EBUS of lung mass.   Past Medical History  Tobacco dependence Dementia HTN HLD Remote h/o CAD and "enlarged heart" per daughter  Significant Hospital Events   N/A  Consults:  PCCM 7/1  Procedures:  N/A  Significant Diagnostic Tests:  CT CAP 6/30- large central necrotic mass in the right with postobstructive atelectasis.  Right hilar and mediastinal lymphadenopathy.  Polypoid bladder mass. I have reviewed the images personally  Echo 7/1-LVEF 55-60% with impaired relaxation.  Normal systolic size and function.  Micro Data:  COVID 19 negative Sputum 7/1>>  Antimicrobials:  Azithromycin 6/30>> Ceftriaxone 6/30>>  Interim history/subjective:  No complaints.  Wants to go go out for smoke  Objective   Blood pressure 118/72, pulse 62, temperature 97.6 F (36.4 C), temperature source Oral, resp. rate 18, height 6\' 3"  (1.905 m), weight 70.6 kg, SpO2 100 %.        Intake/Output Summary (Last 24 hours) at 12/16/2018 1118 Last data filed at 12/16/2018 1002 Gross per 24 hour  Intake 480 ml  Output 600 ml  Net -120 ml   Filed Weights   12/15/18 0401  Weight: 70.6 kg    Examination: Gen:      No acute distress HEENT:  EOMI, sclera anicteric Neck:     No masses; no thyromegaly Lungs:    Clear to auscultation bilaterally; normal respiratory effort CV:          Regular rate and rhythm; no murmurs Abd:      + bowel sounds; soft, non-tender; no palpable masses, no distension Ext:    No edema; adequate peripheral perfusion Skin:      Warm and dry; no rash Neuro: Awake, alert.  Confused  Resolved Hospital Problem list   NA  Assessment & Plan:  78 year old smoker with right lung mass, mediastinal and hilar lymphadenopathy Concerning for malignancy  Discussed with daughter, Richard [name] over the phone.  Risks benefits of bronchoscope, endobronchial biopsy reviewed with her and she has consented for the procedure Labs, echo reviewed Pt is stable for procedure.   [name] MD [facility] Pager [phone] If no answer call [phone] 12/16/2018, 11:18 AM

## 2018-12-16 NOTE — Op Note (Signed)
Video Bronchoscopy with Endobronchial Ultrasound Procedure Note  Date of Operation: 12/16/2018  Pre-op Diagnosis: Lung nodules and mediastinal LAD  Post-op Diagnosis: Same  Surgeon: [name]  Assistants:   Anesthesia: General endotracheal anesthesia  Operation: Flexible video fiberoptic bronchoscopy with endobronchial ultrasound and biopsies.  Estimated Blood Loss: Minimal  Complications: None apparent  Indications and History: 67 is a  y.o. male with lung mass and mediastinal LAD. Recommendation made to achieve tissue sampling via endobronchial ultrasound guided nodal biopsies. The risks, benefits, complications, treatment options and expected outcomes were discussed with the patient.  The possibilities of pneumothorax, pneumonia, reaction to medication, pulmonary aspiration, perforation of a viscus, bleeding, failure to diagnose a condition and creating a complication requiring transfusion or operation were discussed with the patient's daughter who freely agreed to the consent.    Description of Procedure: The patient was examined in the preoperative area and history and data from the preprocedure consultation were reviewed. It was deemed appropriate to proceed.  The patient was taken to OR 11, identified as Richard [name] and the procedure verified as Flexible Video Fiberoptic Bronchoscopy.  A Time Out was held and the above information confirmed. After being taken to the operating room general anesthesia was initiated and the patient  was orally intubated. The video fiberoptic bronchoscope was introduced via the endotracheal tube and a general inspection was performed which showed.  There was obstruction of the right middle lobe with friable tissue which tended to bleed on contact with bronchoscope.  Multiple brushings and transbronchial biopsies of right middle lobe obtained under fluoroscopic guidance.  There is minimal bleeding requiring instillation of epinephrine.  The  standard scope was then withdrawn and the endobronchial ultrasound was used to identify and characterize the peritracheal, hilar and bronchial lymph nodes. Inspection showed enlargement of station 7 and 11R nodes.Using real-time ultrasound guidance Wang needle biopsies were take from these nodes and were sent for cytology.  At the end of the procedure a general airway inspection was performed and there was no evidence of active bleeding. The bronchoscope was removed.  The patient tolerated the procedure well. There was no significant blood loss and there were no obvious complications. A post-procedural chest x-ray is pending.The patient tolerated the procedure well without apparent complications. There was no significant blood loss. The bronchoscope was withdrawn. Anesthesia was reversed and the patient was taken to the PACU for recovery.   Samples: 1. Transbronchial cytology brushings from RML 2. Transbronchial forceps biopsies from RML 3. Wang needle biopsies from 7 and 11R node  Plans:  The patient will be sent to his hospital bed when recovered from anesthesia and after chest x-ray is reviewed. We will review the cytology and pathology results with the patient when they become available. Outpatient followup will be with Dr Vaughan Browner.   [name] MD [facility] 12/16/2018, 2:36 PM

## 2018-12-16 NOTE — Progress Notes (Signed)
Progress Note    Richard [name]  VVO:[id number] DOB: 1941-02-17  DOA: 12/14/2018 PCP: Clent Demark, PA-C    Brief Narrative:     Medical records reviewed and are as summarized below:  Richard [name] is an 78 y.o. male with dementia, hypertension, hyperlipidemia admitted with groin pain.  Has a incidental finding of large necrotic central mass in the right lung concerning for lung cancer.  Work up initiated in hospital due to dementia.  Plan per PCCM is for EBUS.  Assessment/Plan:   Principal Problem:   Lung mass Active Problems:   Pelvic mass in male   Obstructive pneumonia   Hypertension   1. Lung mass; ?postobstructive PNA  - Presents with insidiously worsening anorexia and a week of right groin pain, and is found to have a large necrotic mass on the right concerning for primary lung cancer with postobstructive atelectasis or PNA  -procalcitonin negative but WBC was elevated -  Rocephin and azithromycin-- WBCs trending down - plan for EBUS to work up mass in lungs  2. Bladder mass - Presents with ~1 wk of pain in right groin, possible gross hematuria per family; ED workup concerning for polypoid mass arising from posterior wall or urinary bladder or prostate  - Outpatient urology evaluation   3. Hypertension  - BP is at goal   4. Tobacco abuse -nicotine patch  5. Dementia -lives with daughter -appears to be at baseline (oriented to self only)    Family Communication/Anticipated D/C date and plan/Code Status   DVT prophylaxis: Lovenox ordered. Code Status: Full Code.  Family Communication: spoke with daughter on 7/1 Disposition Plan: needs EBUS   Medical Consultants:    PCCM  Subjective:   Asking for juice  Objective:    Vitals:   12/15/18 0845 12/15/18 1201 12/15/18 2108 12/16/18 0455  BP: 113/63 (!) 118/59 (!) 110/53 118/72  Pulse: 77 71 68 62  Resp: 18 20 17 18   Temp: 97.9 F (36.6 C) 97.8 F (36.6 C) 98 F (36.7 C) 97.6 F (36.4  C)  TempSrc: Oral Oral Oral Oral  SpO2: 96% 97% 98% 100%  Weight:        Intake/Output Summary (Last 24 hours) at 12/16/2018 0836 Last data filed at 12/15/2018 1600 Gross per 24 hour  Intake 480 ml  Output 300 ml  Net 180 ml   Filed Weights   12/15/18 0401  Weight: 70.6 kg    Exam: Alert but confused rrr Diminished but no wheezing +BS, soft NT Oriented to person only No LE edema  Data Reviewed:   I have personally reviewed following labs and imaging studies:  Labs: Labs show the following:   Basic Metabolic Panel: Recent Labs  Lab 12/14/18 2024 12/14/18 2051 12/15/18 0737  NA 136 135 138  K 4.6 4.7 3.9  CL 100 101 102  CO2 26  --  23  GLUCOSE 121* 117* 104*  BUN 10 12 7*  CREATININE 0.87 0.80 0.80  CALCIUM 10.2  --  9.6   GFR CrCl cannot be calculated (Unknown ideal weight.). Liver Function Tests: Recent Labs  Lab 12/14/18 2024 12/15/18 0737  AST 45* 25  ALT 39 33  ALKPHOS 87 83  BILITOT 1.8* 0.6  PROT 8.1 7.5  ALBUMIN 2.5* 2.4*   Recent Labs  Lab 12/14/18 2024  LIPASE 25   No results for input(s): AMMONIA in the last 168 hours. Coagulation profile Recent Labs  Lab 12/16/18 0552  INR 1.2  CBC: Recent Labs  Lab 12/14/18 2024 12/14/18 2051 12/15/18 0737 12/16/18 0552  WBC 13.1*  --  12.1* 10.8*  NEUTROABS 9.0*  --  8.3*  --   HGB 13.4 14.3 13.6 12.6*  HCT 40.4 42.0 40.9 37.8*  MCV 63.4*  --  63.0* 63.3*  PLT 749*  --  716* 695*   Cardiac Enzymes: No results for input(s): CKTOTAL, CKMB, CKMBINDEX, TROPONINI in the last 168 hours. BNP (last 3 results) No results for input(s): PROBNP in the last 8760 hours. CBG: No results for input(s): GLUCAP in the last 168 hours. D-Dimer: No results for input(s): DDIMER in the last 72 hours. Hgb A1c: No results for input(s): HGBA1C in the last 72 hours. Lipid Profile: No results for input(s): CHOL, HDL, LDLCALC, TRIG, CHOLHDL, LDLDIRECT in the last 72 hours. Thyroid function  studies: No results for input(s): TSH, T4TOTAL, T3FREE, THYROIDAB in the last 72 hours.  Invalid input(s): FREET3 Anemia work up: No results for input(s): VITAMINB12, FOLATE, FERRITIN, TIBC, IRON, RETICCTPCT in the last 72 hours. Sepsis Labs: Recent Labs  Lab 12/14/18 2024 12/15/18 0737 12/16/18 0552  PROCALCITON  --  <0.10 <0.10  WBC 13.1* 12.1* 10.8*    Microbiology Recent Results (from the past 240 hour(s))  SARS Coronavirus 2 (CEPHEID - Performed in [facility] hospital lab), Hosp Order     Status: None   Collection Time: 12/15/18 12:22 AM   Specimen: Nasopharyngeal Swab  Result Value Ref Range Status   SARS Coronavirus 2 NEGATIVE NEGATIVE Final    Comment: (NOTE) If result is NEGATIVE SARS-CoV-2 target nucleic acids are NOT DETECTED. The SARS-CoV-2 RNA is generally detectable in upper and lower  respiratory specimens during the acute phase of infection. The lowest  concentration of SARS-CoV-2 viral copies this assay can detect is 250  copies / mL. A negative result does not preclude SARS-CoV-2 infection  and should not be used as the sole basis for treatment or other  patient management decisions.  A negative result may occur with  improper specimen collection / handling, submission of specimen other  than nasopharyngeal swab, presence of viral mutation(s) within the  areas targeted by this assay, and inadequate number of viral copies  (<250 copies / mL). A negative result must be combined with clinical  observations, patient history, and epidemiological information. If result is POSITIVE SARS-CoV-2 target nucleic acids are DETECTED. The SARS-CoV-2 RNA is generally detectable in upper and lower  respiratory specimens dur ing the acute phase of infection.  Positive  results are indicative of active infection with SARS-CoV-2.  Clinical  correlation with patient history and other diagnostic information is  necessary to determine patient infection status.  Positive  results do  not rule out bacterial infection or co-infection with other viruses. If result is PRESUMPTIVE POSTIVE SARS-CoV-2 nucleic acids MAY BE PRESENT.   A presumptive positive result was obtained on the submitted specimen  and confirmed on repeat testing.  While 2019 novel coronavirus  (SARS-CoV-2) nucleic acids may be present in the submitted sample  additional confirmatory testing may be necessary for epidemiological  and / or clinical management purposes  to differentiate between  SARS-CoV-2 and other Sarbecovirus currently known to infect humans.  If clinically indicated additional testing with an alternate test  methodology [phone]) is advised. The SARS-CoV-2 RNA is generally  detectable in upper and lower respiratory sp ecimens during the acute  phase of infection. The expected result is Negative. Fact Sheet for Patients:  StrictlyIdeas.no Fact Sheet for  Healthcare Providers: BankingDealers.co.za This test is not yet approved or cleared by the [place] and has been authorized for detection and/or diagnosis of SARS-CoV-2 by FDA under an Emergency Use Authorization (EUA).  This EUA will remain in effect (meaning this test can be used) for the duration of the COVID-19 declaration under Section 564(b)(1) of the Act, 21 U.S.C. section 360bbb-3(b)(1), unless the authorization is terminated or revoked sooner. Performed at [facility], [place] [address], [place], [place] [address]     Procedures and diagnostic studies:  Ct Chest W Contrast  Result Date: 12/14/2018 CLINICAL DATA:  Lung mass EXAM: CT CHEST WITH CONTRAST TECHNIQUE: Multidetector CT imaging of the chest was performed during intravenous contrast administration. CONTRAST:  37mL OMNIPAQUE IOHEXOL 300 MG/ML  SOLN COMPARISON:  Abdominal CT earlier today FINDINGS: Cardiovascular: Heart is normal size. Aorta normal caliber. Coronary artery and aortic  calcifications. Mediastinum/Nodes: Mediastinal and right hilar adenopathy. Low right paratracheal lymph node has a short axis diameter of 10 mm. Other similarly sized and smaller mediastinal lymph nodes. Right hilar adenopathy has short axis diameter of 13 mm. Lungs/Pleura: Large central right necrotic mass noted. There is postobstructive atelectasis or pneumonia in the right middle lobe as well as small right pleural effusion. The central right lung mass is low-density centrally suggesting necrosis. It is difficult to accurately measure due to postobstructive airspace disease but measures approximately 5.3 x 4.3 cm on image 91. No confluent opacity or effusion on the left. Linear atelectasis or scarring at the left base. Moderate emphysema. Upper Abdomen: Imaging into the upper abdomen shows no acute findings. Musculoskeletal: No acute bony abnormality. IMPRESSION: Large central right necrotic mass measuring approximately 5.3 x 4.3 cm concerning for primary lung cancer. Postobstructive atelectasis or pneumonia in the right middle lobe. Small right pleural effusion. Right hilar and mediastinal adenopathy. Coronary artery disease. Aortic Atherosclerosis (ICD10-I70.0) and Emphysema (ICD10-J43.9). Electronically Signed   By: Rolm Baptise M.D.   On: 12/14/2018 23:40   Ct Abdomen Pelvis W Contrast  Addendum Date: 12/14/2018   ADDENDUM REPORT: 12/14/2018 23:35 ADDENDUM: Additional images were added to the original study. Delayed sequences of the extreme inferior pelvis are submitted. Only the inferior bladder is included. There is dense excreted contrast in the bladder which could potentially obscure lesion. Partially visualized prostate appears enlarged. Electronically Signed   By: Donavan Foil M.D.   On: 12/14/2018 23:35   Result Date: 12/14/2018 CLINICAL DATA:  Groin pain EXAM: CT ABDOMEN AND PELVIS WITH CONTRAST TECHNIQUE: Multidetector CT imaging of the abdomen and pelvis was performed using the standard  protocol following bolus administration of intravenous contrast. CONTRAST:  169mL OMNIPAQUE IOHEXOL 300 MG/ML  SOLN COMPARISON:  None FINDINGS: Lower chest: Small right-sided pleural effusion. Partial atelectasis or mild pneumonia in the right lung base. Incompletely visualized heterogenous masslike density in the right hilar to infrahilar lung with central low attenuation, possibly representing necrotic mass. This measures 4 cm transverse on coronal views, the cephalad extent is incompletely imaged. Postobstructive atelectasis or pneumonia in the right middle lobe. Abrupt occlusion of the right middle lobe bronchus. Heart size is normal. Hepatobiliary: No focal liver abnormality is seen. No gallstones, gallbladder wall thickening, or biliary dilatation. Pancreas: Unremarkable. No pancreatic ductal dilatation or surrounding inflammatory changes. Spleen: Normal in size without focal abnormality. Adrenals/Urinary Tract: Adrenal glands are normal. Kidneys are within normal limits. No hydronephrosis. Bladder largely obscured by artifact from left hip hardware. Bladder may be thick-walled. Suspicion of polypoid mass either arising  from prostate or the posterior wall of the bladder. Stomach/Bowel: The stomach is nonenlarged. No dilated small bowel. No bowel wall thickening. Negative appendix. Vascular/Lymphatic: Extensive aortic atherosclerosis without aneurysm. Heavily calcified distal common iliac, external iliac and internal iliac arteries with possible segmental occlusion of left proximal external iliac artery. No significantly enlarged lymph nodes. Reproductive: Prostate is likely enlarged. Possible polypoid projection off the anterior prostate. Other: Negative for free air or free fluid Musculoskeletal: Left hip replacement with artifact. Diffuse degenerative changes of the spine. No acute osseous abnormality. IMPRESSION: 1. Small right-sided pleural effusion. Atelectasis/consolidation of the right middle lobe with  incompletely visualized heterogenous masslike density in the right hilar/infrahilar region with central low attenuation. Primary concern is for necrotic lung mass/carcinoma with postobstructive atelectasis or pneumonia of the right middle lobe. Dedicated chest CT, preferably with contrast, when clinically feasible is recommended. 2. Small right-sided pleural effusion with probable atelectasis at the right posterior lung base. 3. Suspicion of polypoid mass either arising from posterior wall of the bladder versus the prostate gland. Further characterisation limited due to artifact from left hip hardware. 4. 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 Electronically Signed: By: Donavan Foil M.D. On: 12/14/2018 22:04   US Scrotum W/doppler  Result Date: 12/14/2018 CLINICAL DATA:  78 year old male with testicular pain. EXAM: SCROTAL ULTRASOUND DOPPLER ULTRASOUND OF THE TESTICLES TECHNIQUE: Complete ultrasound examination of the testicles, epididymis, and other scrotal structures was performed. Color and spectral Doppler ultrasound were also utilized to evaluate blood flow to the testicles. COMPARISON:  CT Abdomen and Pelvis today are reported separately. FINDINGS: Right testicle Measurements: 3.4 x 1.8 x 1.8 centimeters. Heterogeneous echotexture (image 2) but no discrete testicular mass. Left testicle Measurements: 3.7 x 1.7 x 1.9 centimeters. Heterogeneous echotexture (image 26) but no discrete testicular mass. Right epididymis:  Normal in size and appearance. Left epididymis:  Could not be identified. Hydrocele:  None visualized. Varicocele:  None visualized. Pulsed Doppler interrogation of both testes demonstrates normal low resistance arterial and venous waveforms bilaterally. IMPRESSION: 1. No evidence of testicular torsion. 2. Symmetric heterogeneity of both testes with no discrete testicular mass. Electronically  Signed   By: Genevie Ann M.D.   On: 12/14/2018 21:52    Medications:    atorvastatin  20 mg Oral QPM   losartan  50 mg Oral Daily   nicotine  7 mg Transdermal Daily   sodium chloride flush  3 mL Intravenous Q12H   Continuous Infusions:  sodium chloride     azithromycin 500 mg (12/16/18 0126)   cefTRIAXone (ROCEPHIN)  IV 2 g (12/15/18 2339)     LOS: 1 day   Geradine Girt  [facility]   How to contact the [facility] Attending or Consulting provider [place] or covering provider during after hours [place], for this patient?  1. Check the care team in [facility] and look for a) attending/consulting [facility] provider listed and b) the [facility] team listed 2. Log into [email or web address] and use [place]'s universal password to access. If you do not have the password, please contact the hospital operator. 3. Locate the [facility] provider you are looking for under [facility] and page to a number that you can be directly reached. 4. If you still have difficulty reaching the provider, please page the [facility] (Director on Call) for the Hospitalists listed on amion for assistance.  12/16/2018, 8:36 AM

## 2018-12-17 LAB — BASIC METABOLIC PANEL
Anion gap: 9 (ref 5–15)
BUN: 10 mg/dL (ref 8–23)
CO2: 24 mmol/L (ref 22–32)
Calcium: 9.5 mg/dL (ref 8.9–10.3)
Chloride: 104 mmol/L (ref 98–111)
Creatinine, Ser: 0.72 mg/dL (ref 0.61–1.24)
GFR calc Af Amer: >60 mL/min (ref >60)
GFR calc non Af Amer: >60 mL/min (ref >60)
Glucose, Bld: 126 mg/dL — ABNORMAL HIGH (ref 70–99)
Potassium: 4.3 mmol/L (ref 3.5–5.1)
Sodium: 137 mmol/L (ref 135–145)

## 2018-12-17 LAB — CBC
HCT: 37.9 % — ABNORMAL LOW (ref 39.0–52.0)
Hemoglobin: 12.7 g/dL — ABNORMAL LOW (ref 13.0–17.0)
MCH: 21.3 pg — ABNORMAL LOW (ref 26.0–34.0)
MCHC: 33.5 g/dL (ref 30.0–36.0)
MCV: 63.5 fL — ABNORMAL LOW (ref 80.0–100.0)
Platelets: 714 10*3/uL — ABNORMAL HIGH (ref 150–400)
RBC: 5.97 MIL/uL — ABNORMAL HIGH (ref 4.22–5.81)
RDW: 15.8 % — ABNORMAL HIGH (ref 11.5–15.5)
WBC: 14.1 10*3/uL — ABNORMAL HIGH (ref 4.0–10.5)
nRBC: 0 % (ref 0.0–0.2)

## 2018-12-17 LAB — PROCALCITONIN: Procalcitonin: <0.1 ng/mL

## 2018-12-17 MED ORDER — DOXYCYCLINE HYCLATE 100 MG PO TABS: 100.0000 mg | ORAL_TABLET | Freq: Two times a day (BID) | ORAL | 0 refills | Status: AC

## 2018-12-17 MED ORDER — DOXYCYCLINE HYCLATE 100 MG PO TABS
100.0000 mg | ORAL_TABLET | Freq: Two times a day (BID) | ORAL | Status: DC
  Administered 2018-12-17: 100 mg via ORAL
  Filled 2018-12-17: qty 1

## 2018-12-17 MED ORDER — AMOXICILLIN-POT CLAVULANATE 875-125 MG PO TABS
1.0000 | ORAL_TABLET | Freq: Two times a day (BID) | ORAL | Status: DC
  Administered 2018-12-17: 1 via ORAL
  Filled 2018-12-17: qty 1

## 2018-12-17 MED ORDER — AMOXICILLIN-POT CLAVULANATE 875-125 MG PO TABS: 1.0000 | ORAL_TABLET | Freq: Two times a day (BID) | ORAL | 0 refills | Status: AC

## 2018-12-17 MED ORDER — HYDROCODONE-ACETAMINOPHEN 5-325 MG PO TABS: 1.0000 | ORAL_TABLET | ORAL | 0 refills | Status: DC | PRN

## 2018-12-17 MED ORDER — NICOTINE 7 MG/24HR TD PT24: 7.0000 mg | MEDICATED_PATCH | Freq: Every day | TRANSDERMAL | 0 refills | Status: DC

## 2018-12-17 NOTE — Discharge Summary (Signed)
Physician Discharge Summary  Richard [name] OEV:[id number] DOB: 01-23-1941 DOA: 12/14/2018  PCP: Clent Demark, PA-C  Admit date: 12/14/2018 Discharge date: 12/17/2018  Admitted From: home Discharge disposition: home   Recommendations for Outpatient Follow-Up:   Home health Close outpatient follow up with Dr. Wonda Amis for biopsy results   Discharge Diagnosis:   Principal Problem:   Lung mass Active Problems:   Pelvic mass in male   Obstructive pneumonia   Hypertension   Dementia without behavioral disturbance ([place])    Discharge Condition: Improved.  Diet recommendation: Low sodium, heart healthy  Wound care: None.  Code status: Full.   History of Present Illness:   Richard [name] is a 78 y.o. male with medical history significant for tobacco abuse, dementia, hypertension, and hyperlipidemia, presenting to the emergency department for evaluation of groin pain.  Patient was accompanied by family who provides most of the history, reporting that patient has not been eating or drinking much of anything recently despite encouragement to do so.  He has been complaining of pain in the right groin for roughly 1 week now but his family look at it.  Family noted some red blood in his underwear recently.  Patient is said to have a chronic cough but this does not seem to have changed much.  There has not been any recent fevers or chills reported.   Hospital Course by Problem:   1.Lung mass; ?postobstructive PNA -Presents with insidiously worsening anorexia and a week of right groin pain, and is found to have a large necrotic mass on the right concerning for primary lung cancer with postobstructive atelectasis or PNA -procalcitonin negative but WBC was elevated - s/p EBUS -outpatient follow up for biopsy results -doxy x 1 week -augmentin x 2 weeks  2.Bladder mass -Presents with ~1 wk of pain in right groin, possible gross hematuria per family; ED workup concerning  for polypoid mass arising from posterior wall or urinary bladder or prostate -Outpatient urology evaluation -no further pain with walking -scrotal support  3.Hypertension  - BP is at goal  4. Tobacco abuse -nicotine patch  5. Dementia -lives with daughter -appears to be at baseline (oriented to self only)    Medical Consultants:    PCCM  Discharge Exam:   Vitals:   12/16/18 2345 12/17/18 0348  BP: 128/62 122/66  Pulse: 66 67  Resp: 18 17  Temp: 97.6 F (36.4 C) (!) 97.5 F (36.4 C)  SpO2: 97% 99%   Vitals:   12/16/18 1807 12/16/18 1954 12/16/18 2345 12/17/18 0348  BP: 123/61 108/68 128/62 122/66  Pulse: 86 76 66 67  Resp: 18 19 18 17   Temp: 97.7 F (36.5 C) (!) 97.5 F (36.4 C) 97.6 F (36.4 C) (!) 97.5 F (36.4 C)  TempSrc: Oral Oral Axillary Axillary  SpO2: 100% 97% 97% 99%  Weight:      Height:        General exam: Appears calm and comfortable.  The results of significant diagnostics from this hospitalization (including imaging, microbiology, ancillary and laboratory) are listed below for reference.     Procedures and Diagnostic Studies:   Ct Chest W Contrast  Result Date: 12/14/2018 CLINICAL DATA:  Lung mass EXAM: CT CHEST WITH CONTRAST TECHNIQUE: Multidetector CT imaging of the chest was performed during intravenous contrast administration. CONTRAST:  83mL OMNIPAQUE IOHEXOL 300 MG/ML  SOLN COMPARISON:  Abdominal CT earlier today FINDINGS: Cardiovascular: Heart is normal size. Aorta normal caliber. Coronary artery and aortic calcifications.  Mediastinum/Nodes: Mediastinal and right hilar adenopathy. Low right paratracheal lymph node has a short axis diameter of 10 mm. Other similarly sized and smaller mediastinal lymph nodes. Right hilar adenopathy has short axis diameter of 13 mm. Lungs/Pleura: Large central right necrotic mass noted. There is postobstructive atelectasis or pneumonia in the right middle lobe as well as small right pleural  effusion. The central right lung mass is low-density centrally suggesting necrosis. It is difficult to accurately measure due to postobstructive airspace disease but measures approximately 5.3 x 4.3 cm on image 91. No confluent opacity or effusion on the left. Linear atelectasis or scarring at the left base. Moderate emphysema. Upper Abdomen: Imaging into the upper abdomen shows no acute findings. Musculoskeletal: No acute bony abnormality. IMPRESSION: Large central right necrotic mass measuring approximately 5.3 x 4.3 cm concerning for primary lung cancer. Postobstructive atelectasis or pneumonia in the right middle lobe. Small right pleural effusion. Right hilar and mediastinal adenopathy. Coronary artery disease. Aortic Atherosclerosis (ICD10-I70.0) and Emphysema (ICD10-J43.9). Electronically Signed   By: Rolm Baptise M.D.   On: 12/14/2018 23:40   Ct Abdomen Pelvis W Contrast  Addendum Date: 12/14/2018   ADDENDUM REPORT: 12/14/2018 23:35 ADDENDUM: Additional images were added to the original study. Delayed sequences of the extreme inferior pelvis are submitted. Only the inferior bladder is included. There is dense excreted contrast in the bladder which could potentially obscure lesion. Partially visualized prostate appears enlarged. Electronically Signed   By: Donavan Foil M.D.   On: 12/14/2018 23:35   Result Date: 12/14/2018 CLINICAL DATA:  Groin pain EXAM: CT ABDOMEN AND PELVIS WITH CONTRAST TECHNIQUE: Multidetector CT imaging of the abdomen and pelvis was performed using the standard protocol following bolus administration of intravenous contrast. CONTRAST:  132mL OMNIPAQUE IOHEXOL 300 MG/ML  SOLN COMPARISON:  None FINDINGS: Lower chest: Small right-sided pleural effusion. Partial atelectasis or mild pneumonia in the right lung base. Incompletely visualized heterogenous masslike density in the right hilar to infrahilar lung with central low attenuation, possibly representing necrotic mass. This measures  4 cm transverse on coronal views, the cephalad extent is incompletely imaged. Postobstructive atelectasis or pneumonia in the right middle lobe. Abrupt occlusion of the right middle lobe bronchus. Heart size is normal. Hepatobiliary: No focal liver abnormality is seen. No gallstones, gallbladder wall thickening, or biliary dilatation. Pancreas: Unremarkable. No pancreatic ductal dilatation or surrounding inflammatory changes. Spleen: Normal in size without focal abnormality. Adrenals/Urinary Tract: Adrenal glands are normal. Kidneys are within normal limits. No hydronephrosis. Bladder largely obscured by artifact from left hip hardware. Bladder may be thick-walled. Suspicion of polypoid mass either arising from prostate or the posterior wall of the bladder. Stomach/Bowel: The stomach is nonenlarged. No dilated small bowel. No bowel wall thickening. Negative appendix. Vascular/Lymphatic: Extensive aortic atherosclerosis without aneurysm. Heavily calcified distal common iliac, external iliac and internal iliac arteries with possible segmental occlusion of left proximal external iliac artery. No significantly enlarged lymph nodes. Reproductive: Prostate is likely enlarged. Possible polypoid projection off the anterior prostate. Other: Negative for free air or free fluid Musculoskeletal: Left hip replacement with artifact. Diffuse degenerative changes of the spine. No acute osseous abnormality. IMPRESSION: 1. Small right-sided pleural effusion. Atelectasis/consolidation of the right middle lobe with incompletely visualized heterogenous masslike density in the right hilar/infrahilar region with central low attenuation. Primary concern is for necrotic lung mass/carcinoma with postobstructive atelectasis or pneumonia of the right middle lobe. Dedicated chest CT, preferably with contrast, when clinically feasible is recommended. 2. Small right-sided pleural effusion with probable  atelectasis at the right posterior lung  base. 3. Suspicion of polypoid mass either arising from posterior wall of the bladder versus the prostate gland. Further characterisation limited due to artifact from left hip hardware. 4. 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 Electronically Signed: By: Donavan Foil M.D. On: 12/14/2018 22:04   US Scrotum W/doppler  Result Date: 12/14/2018 CLINICAL DATA:  78 year old male with testicular pain. EXAM: SCROTAL ULTRASOUND DOPPLER ULTRASOUND OF THE TESTICLES TECHNIQUE: Complete ultrasound examination of the testicles, epididymis, and other scrotal structures was performed. Color and spectral Doppler ultrasound were also utilized to evaluate blood flow to the testicles. COMPARISON:  CT Abdomen and Pelvis today are reported separately. FINDINGS: Right testicle Measurements: 3.4 x 1.8 x 1.8 centimeters. Heterogeneous echotexture (image 2) but no discrete testicular mass. Left testicle Measurements: 3.7 x 1.7 x 1.9 centimeters. Heterogeneous echotexture (image 26) but no discrete testicular mass. Right epididymis:  Normal in size and appearance. Left epididymis:  Could not be identified. Hydrocele:  None visualized. Varicocele:  None visualized. Pulsed Doppler interrogation of both testes demonstrates normal low resistance arterial and venous waveforms bilaterally. IMPRESSION: 1. No evidence of testicular torsion. 2. Symmetric heterogeneity of both testes with no discrete testicular mass. Electronically Signed   By: Genevie Ann M.D.   On: 12/14/2018 21:52     Labs:   Basic Metabolic Panel: Recent Labs  Lab 12/14/18 2024 12/14/18 2051 12/15/18 0737 12/17/18 0323  NA 136 135 138 137  K 4.6 4.7 3.9 4.3  CL 100 101 102 104  CO2 26  --  23 24  GLUCOSE 121* 117* 104* 126*  BUN 10 12 7* 10  CREATININE 0.87 0.80 0.80 0.72  CALCIUM 10.2  --  9.6 9.5   GFR Estimated Creatinine Clearance: 78.4 mL/min (by C-G  formula based on SCr of 0.72 mg/dL). Liver Function Tests: Recent Labs  Lab 12/14/18 2024 12/15/18 0737  AST 45* 25  ALT 39 33  ALKPHOS 87 83  BILITOT 1.8* 0.6  PROT 8.1 7.5  ALBUMIN 2.5* 2.4*   Recent Labs  Lab 12/14/18 2024  LIPASE 25   No results for input(s): AMMONIA in the last 168 hours. Coagulation profile Recent Labs  Lab 12/16/18 0552  INR 1.2    CBC: Recent Labs  Lab 12/14/18 2024 12/14/18 2051 12/15/18 0737 12/16/18 0552 12/17/18 0323  WBC 13.1*  --  12.1* 10.8* 14.1*  NEUTROABS 9.0*  --  8.3*  --   --   HGB 13.4 14.3 13.6 12.6* 12.7*  HCT 40.4 42.0 40.9 37.8* 37.9*  MCV 63.4*  --  63.0* 63.3* 63.5*  PLT 749*  --  716* 695* 714*   Cardiac Enzymes: No results for input(s): CKTOTAL, CKMB, CKMBINDEX, TROPONINI in the last 168 hours. BNP: Invalid input(s): POCBNP CBG: No results for input(s): GLUCAP in the last 168 hours. D-Dimer No results for input(s): DDIMER in the last 72 hours. Hgb A1c No results for input(s): HGBA1C in the last 72 hours. Lipid Profile No results for input(s): CHOL, HDL, LDLCALC, TRIG, CHOLHDL, LDLDIRECT in the last 72 hours. Thyroid function studies No results for input(s): TSH, T4TOTAL, T3FREE, THYROIDAB in the last 72 hours.  Invalid input(s): FREET3 Anemia work up No results for input(s): VITAMINB12, FOLATE, FERRITIN, TIBC, IRON, RETICCTPCT in the last 72 hours. Microbiology Recent Results (from the past 240 hour(s))  SARS Coronavirus 2 (CEPHEID - Performed in [facility] hospital lab), [facility]  Order     Status: None   Collection Time: 12/15/18 12:22 AM   Specimen: Nasopharyngeal Swab  Result Value Ref Range Status   SARS Coronavirus 2 NEGATIVE NEGATIVE Final    Comment: (NOTE) If result is NEGATIVE SARS-CoV-2 target nucleic acids are NOT DETECTED. The SARS-CoV-2 RNA is generally detectable in upper and lower  respiratory specimens during the acute phase of infection. The lowest  concentration of SARS-CoV-2 viral  copies this assay can detect is 250  copies / mL. A negative result does not preclude SARS-CoV-2 infection  and should not be used as the sole basis for treatment or other  patient management decisions.  A negative result may occur with  improper specimen collection / handling, submission of specimen other  than nasopharyngeal swab, presence of viral mutation(s) within the  areas targeted by this assay, and inadequate number of viral copies  (<250 copies / mL). A negative result must be combined with clinical  observations, patient history, and epidemiological information. If result is POSITIVE SARS-CoV-2 target nucleic acids are DETECTED. The SARS-CoV-2 RNA is generally detectable in upper and lower  respiratory specimens dur ing the acute phase of infection.  Positive  results are indicative of active infection with SARS-CoV-2.  Clinical  correlation with patient history and other diagnostic information is  necessary to determine patient infection status.  Positive results do  not rule out bacterial infection or co-infection with other viruses. If result is PRESUMPTIVE POSTIVE SARS-CoV-2 nucleic acids MAY BE PRESENT.   A presumptive positive result was obtained on the submitted specimen  and confirmed on repeat testing.  While 2019 novel coronavirus  (SARS-CoV-2) nucleic acids may be present in the submitted sample  additional confirmatory testing may be necessary for epidemiological  and / or clinical management purposes  to differentiate between  SARS-CoV-2 and other Sarbecovirus currently known to infect humans.  If clinically indicated additional testing with an alternate test  methodology [phone]) is advised. The SARS-CoV-2 RNA is generally  detectable in upper and lower respiratory sp ecimens during the acute  phase of infection. The expected result is Negative. Fact Sheet for Patients:  StrictlyIdeas.no Fact Sheet for Healthcare  Providers: BankingDealers.co.za This test is not yet approved or cleared by the [place] FDA and has been authorized for detection and/or diagnosis of SARS-CoV-2 by FDA under an Emergency Use Authorization (EUA).  This EUA will remain in effect (meaning this test can be used) for the duration of the COVID-19 declaration under Section 564(b)(1) of the Act, 21 U.S.C. section 360bbb-3(b)(1), unless the authorization is terminated or revoked sooner. Performed at [facility], [place] [address] [place], [place] [address]      Discharge Instructions:   Discharge Instructions    Ambulatory referral to Urology   Complete by: As directed    Diet - low sodium heart healthy   Complete by: As directed    Discharge instructions   Complete by: As directed    Smoking cessation If taking pain medications will also need to take colase/senna for a bowel regimen   Increase activity slowly   Complete by: As directed      Allergies as of 12/17/2018   No Known Allergies     Medication List    STOP taking these medications   hydrochlorothiazide 12.5 MG tablet Commonly known as: HYDRODIURIL     TAKE these medications   acetaminophen 500 MG tablet Commonly known as: TYLENOL Take 2 tablets (1,000 mg total) by mouth  every 8 (eight) hours as needed for mild pain.   amoxicillin-clavulanate 875-125 MG tablet Commonly known as: AUGMENTIN Take 1 tablet by mouth every 12 (twelve) hours for 14 days.   aspirin 81 MG EC tablet Commonly known as: Aspirin 81 Take 1 tablet (81 mg total) by mouth daily.   atorvastatin 20 MG tablet Commonly known as: LIPITOR Take 1 tablet (20 mg total) by mouth every evening.   doxycycline 100 MG tablet Commonly known as: VIBRA-TABS Take 1 tablet (100 mg total) by mouth every 12 (twelve) hours for 7 days.   FISH OIL PO Take 1 capsule by mouth daily.   HYDROcodone-acetaminophen 5-325 MG tablet Commonly known as: NORCO/VICODIN Take  1 tablet by mouth every 4 (four) hours as needed for moderate pain.   losartan 50 MG tablet Commonly known as: COZAAR Take 1 tablet (50 mg total) by mouth daily.   nicotine 7 mg/24hr patch Commonly known as: NICODERM CQ - dosed in mg/24 hr Place 1 patch (7 mg total) onto the skin daily. Start taking on: December 18, 2018      Follow-up Information    Clent Demark, PA-C Follow up in 1 week(s).   Specialty: Physician Assistant Contact information: [place] [address] [phone]        [name], MD Follow up.   Specialty: Pulmonary Disease Why: appointment will be scheduled to go over pathology report Contact information: [place] [address] [phone]            Time coordinating discharge: 35 min  Signed:  Geradine Girt DO  [facility] 12/17/2018, 9:19 AM

## 2018-12-17 NOTE — TOC Transition Note (Addendum)
Transition of Care [facility]) - CM/SW Discharge Note   Patient Details  Name: Dhanvin Szeto MRN: [id number] Date of Birth: Nov 15, 1940  Transition of [facility] CM/SW Contact:  Marilu Favre, RN Phone Number: 12/17/2018, 10:12 AM   Clinical Narrative:     Discussed with patient's daughter on phone [name] [phone] . EPICC address correct except [address].  Provided Medicare.gov list, Ms Rape has no preference. Explained due to holiday weekend start of care may be later next week. Ms [place] voiced understanding.   Once patient is ready for discharge, Autumn Gunn will provide transportation home.   Butch Penny with [facility] accepted referral, start of care will be next week.  Final next level of care: [place] Barriers to Discharge: No Barriers Identified   Patient Goals and CMS Choice Patient states their goals for this hospitalization and ongoing recovery are:: daughter Kymani Shimabukuro [address] Medicare.gov Compare Post Acute Care list provided to:: Other (Comment Required) Choice offered to / list presented to : Adult Children  Discharge Placement                       Discharge Plan and Services   Discharge Planning Services: CM Consult Post Acute Care Choice: Home Health          DME Arranged: N/A         HH Arranged: PT HH Agency: [place] ([facility]) Date [place]: 12/17/18 Time [place]: 1011 Representative spoke with at [place]: Butch Penny checking to see if she can accept referral.  Social Determinants of Health (SDOH) Interventions     Readmission Risk Interventions No flowsheet data found.

## 2018-12-17 NOTE — Progress Notes (Signed)
Patient discharged to home with instructions given to his daughter Gildo Crisco.

## 2018-12-19 ENCOUNTER — Encounter (HOSPITAL_COMMUNITY): Payer: Self-pay | Admitting: Pulmonary Disease

## 2018-12-20 ENCOUNTER — Telehealth: Payer: Self-pay | Admitting: Pulmonary Disease

## 2018-12-20 ENCOUNTER — Telehealth (INDEPENDENT_AMBULATORY_CARE_PROVIDER_SITE_OTHER): Payer: Self-pay | Admitting: Physician Assistant

## 2018-12-20 DIAGNOSIS — I10 Essential (primary) hypertension: Secondary | ICD-10-CM | POA: Diagnosis not present

## 2018-12-20 DIAGNOSIS — Z79891 Long term (current) use of opiate analgesic: Secondary | ICD-10-CM | POA: Diagnosis not present

## 2018-12-20 DIAGNOSIS — Z7982 Long term (current) use of aspirin: Secondary | ICD-10-CM | POA: Diagnosis not present

## 2018-12-20 DIAGNOSIS — E785 Hyperlipidemia, unspecified: Secondary | ICD-10-CM | POA: Diagnosis not present

## 2018-12-20 DIAGNOSIS — J188 Other pneumonia, unspecified organism: Secondary | ICD-10-CM | POA: Diagnosis not present

## 2018-12-20 DIAGNOSIS — C342 Malignant neoplasm of middle lobe, bronchus or lung: Secondary | ICD-10-CM | POA: Diagnosis not present

## 2018-12-20 DIAGNOSIS — R69 Illness, unspecified: Secondary | ICD-10-CM | POA: Diagnosis not present

## 2018-12-20 NOTE — Telephone Encounter (Signed)
Since Hospital Follow Up appointment made, nothing further needed. Will close encounter.

## 2018-12-20 NOTE — Telephone Encounter (Signed)
Herbert Deaner physical therapist from [facility] called to request verbal orders for therapy 2x a week for 2 weeks. Since patient was recently in the hospital. Also would like a referral for occupational therapy for bathing. Patient and caregiver report several days of poor appetite.   Herbert Deaner [phone]  Thank you Emmit Pomfret

## 2018-12-20 NOTE — Telephone Encounter (Signed)
LMTCB for pt 

## 2018-12-20 NOTE — Telephone Encounter (Signed)
From a staff message:   [name], MD  P [facility] Triage Pool; Elton Sin, LPN        Please make follow up in 1 week to see me or APP for post hospital follow up. Thanks

## 2018-12-20 NOTE — Telephone Encounter (Signed)
Pt returned call. 1wk Hosp f/up made with Derl Barrow for 07/15 at 10:30am.

## 2018-12-21 ENCOUNTER — Encounter: Payer: Self-pay | Admitting: *Deleted

## 2018-12-21 ENCOUNTER — Telehealth: Payer: Self-pay | Admitting: Pulmonary Disease

## 2018-12-21 DIAGNOSIS — C3491 Malignant neoplasm of unspecified part of right bronchus or lung: Secondary | ICD-10-CM

## 2018-12-21 DIAGNOSIS — R918 Other nonspecific abnormal finding of lung field: Secondary | ICD-10-CM

## 2018-12-21 NOTE — Progress Notes (Signed)
Oncology Nurse Navigator Documentation  Oncology Nurse Navigator Flowsheets 12/21/2018  Navigator Location [facility]  Referral Date to RadOnc/MedOnc 12/21/2018  Navigator Encounter Type Other/I received referral today on Mr. Richard [name].  I updated new patient scheduler to call and scheduled patient to be seen 12/28/2018 at 2:15 and labs before.   Treatment Phase Pre-Tx/Tx Discussion  Barriers/Navigation Needs Coordination of Care  Interventions Coordination of Care  Coordination of Care Other  Acuity Level 2  Time Spent with Patient 30

## 2018-12-21 NOTE — Telephone Encounter (Signed)
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 Please call daughter to make appointment as pt has dementia  [name] MD [facility] 12/21/2018, 10:35 AM

## 2018-12-21 NOTE — Telephone Encounter (Signed)
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 Please call daughter to make appointment as pt has dementia  [name] MD [facility] 12/21/2018, 10:35 AM  Left message for patient. Will need to get his consent before speaking with Richard [name] since there is no DPR on file.

## 2018-12-22 NOTE — Telephone Encounter (Signed)
Spoke with pt's daughter, [name]. She placed the pt on the phone and he verbally gave me permission to speak to his brother, Eddie Dibbles.  Spoke with pt's brother, Eddie Dibbles. I have explained the pt's results to him.  Referral has been placed for Onocology. When the appointment is made, [name] will need to made aware of the information.

## 2018-12-22 NOTE — Telephone Encounter (Signed)
Pt's daughter [name] is calling. [name] is very upset that we were unable to contact pt's brother, Eddie Dibbles. Attempted to explain to [name] that Eddie Dibbles is not on the [facility] and we are unable to provide pt info without consent. [name] began yelling and requested a nurse call her for consent before ending phone call.  CB is [phone].

## 2018-12-23 ENCOUNTER — Telehealth: Payer: Self-pay | Admitting: Radiation Oncology

## 2018-12-23 ENCOUNTER — Other Ambulatory Visit: Payer: Self-pay | Admitting: *Deleted

## 2018-12-23 NOTE — Progress Notes (Signed)
The proposed treatment discussed at cancer conference 12/23/2018 is for discussion purpose only and is not a binding recommendation.  The patient was not physically examined nor present for their treatment options.  Therefore, final treatment recommendations cannot be decided.

## 2018-12-23 NOTE — Telephone Encounter (Signed)
New message:    LVM for patient to return call to schedule appt from referral received.

## 2018-12-24 ENCOUNTER — Telehealth: Payer: Self-pay | Admitting: *Deleted

## 2018-12-24 NOTE — Telephone Encounter (Signed)
Received vm message from pt's daughter, Colbi Staubs. Patient has a new patient appt on 12/28/18 with Dr. Julien Nordmann. [name] states that patient has dementia and she will need to be with him for appts.  Please advise

## 2018-12-24 NOTE — Anesthesia Postprocedure Evaluation (Signed)
Anesthesia Post Note  Patient: Richard [name]  Procedure(s) Performed: VIDEO BRONCHOSCOPY WITH ENDOBRONCHIAL ULTRASOUND AND FLUROSCOPY (N/A )     Anesthesia Post Evaluation  Last Vitals:  Vitals:   12/17/18 0348 12/17/18 1028  BP: 122/66 110/68  Pulse: 67 67  Resp: 17 18  Temp: (!) 36.4 C 36.4 C  SpO2: 99% 98%    Last Pain:  Vitals:   12/17/18 1028  TempSrc: Axillary  PainSc:                  Thalya Fouche

## 2018-12-24 NOTE — Telephone Encounter (Signed)
Verbal given. And patient has scheduled appointment for 12/31/18. Nat Christen, CMA

## 2018-12-27 ENCOUNTER — Telehealth: Payer: Self-pay | Admitting: Radiation Oncology

## 2018-12-27 NOTE — Telephone Encounter (Addendum)
pts brother, Richard [name] needs to be called durign pt visit -He can use facetime

## 2018-12-27 NOTE — Telephone Encounter (Signed)
New message:    LVM for patient to return call to schedule appt from referral received

## 2018-12-28 ENCOUNTER — Encounter: Payer: Self-pay | Admitting: Internal Medicine

## 2018-12-28 ENCOUNTER — Inpatient Hospital Stay (HOSPITAL_BASED_OUTPATIENT_CLINIC_OR_DEPARTMENT_OTHER): Payer: Medicare HMO | Admitting: Internal Medicine

## 2018-12-28 ENCOUNTER — Inpatient Hospital Stay: Payer: Medicare HMO | Attending: Internal Medicine

## 2018-12-28 ENCOUNTER — Other Ambulatory Visit: Payer: Self-pay

## 2018-12-28 ENCOUNTER — Telehealth: Payer: Self-pay | Admitting: *Deleted

## 2018-12-28 DIAGNOSIS — R69 Illness, unspecified: Secondary | ICD-10-CM | POA: Diagnosis not present

## 2018-12-28 DIAGNOSIS — J90 Pleural effusion, not elsewhere classified: Secondary | ICD-10-CM | POA: Insufficient documentation

## 2018-12-28 DIAGNOSIS — C3491 Malignant neoplasm of unspecified part of right bronchus or lung: Secondary | ICD-10-CM | POA: Insufficient documentation

## 2018-12-28 DIAGNOSIS — C342 Malignant neoplasm of middle lobe, bronchus or lung: Secondary | ICD-10-CM

## 2018-12-28 DIAGNOSIS — Z5111 Encounter for antineoplastic chemotherapy: Secondary | ICD-10-CM | POA: Insufficient documentation

## 2018-12-28 DIAGNOSIS — Z7189 Other specified counseling: Secondary | ICD-10-CM

## 2018-12-28 DIAGNOSIS — R918 Other nonspecific abnormal finding of lung field: Secondary | ICD-10-CM

## 2018-12-28 DIAGNOSIS — F17210 Nicotine dependence, cigarettes, uncomplicated: Secondary | ICD-10-CM | POA: Insufficient documentation

## 2018-12-28 DIAGNOSIS — C3490 Malignant neoplasm of unspecified part of unspecified bronchus or lung: Secondary | ICD-10-CM

## 2018-12-28 LAB — CMP (CANCER CENTER ONLY)
ALT: 21 U/L (ref 0–44)
AST: 16 U/L (ref 15–41)
Albumin: 2.5 g/dL — ABNORMAL LOW (ref 3.5–5.0)
Alkaline Phosphatase: 89 U/L (ref 38–126)
Anion gap: 9 (ref 5–15)
BUN: 13 mg/dL (ref 8–23)
CO2: 26 mmol/L (ref 22–32)
Calcium: 9.9 mg/dL (ref 8.9–10.3)
Chloride: 107 mmol/L (ref 98–111)
Creatinine: 0.75 mg/dL (ref 0.61–1.24)
GFR, Est AFR Am: >60 mL/min (ref >60)
GFR, Estimated: >60 mL/min (ref >60)
Glucose, Bld: 95 mg/dL (ref 70–99)
Potassium: 4.3 mmol/L (ref 3.5–5.1)
Sodium: 142 mmol/L (ref 135–145)
Total Bilirubin: 0.3 mg/dL (ref 0.3–1.2)
Total Protein: 7.9 g/dL (ref 6.5–8.1)

## 2018-12-28 LAB — CBC WITH DIFFERENTIAL (CANCER CENTER ONLY)
Abs Immature Granulocytes: 0.04 10*3/uL (ref 0.00–0.07)
Basophils Absolute: 0.1 10*3/uL (ref 0.0–0.1)
Basophils Relative: 0 %
Eosinophils Absolute: 0 10*3/uL (ref 0.0–0.5)
Eosinophils Relative: 0 %
HCT: 37.8 % — ABNORMAL LOW (ref 39.0–52.0)
Hemoglobin: 12.5 g/dL — ABNORMAL LOW (ref 13.0–17.0)
Immature Granulocytes: 0 %
Lymphocytes Relative: 25 %
Lymphs Abs: 3.5 10*3/uL (ref 0.7–4.0)
MCH: 20.9 pg — ABNORMAL LOW (ref 26.0–34.0)
MCHC: 33.1 g/dL (ref 30.0–36.0)
MCV: 63.2 fL — ABNORMAL LOW (ref 80.0–100.0)
Monocytes Absolute: 1.1 10*3/uL — ABNORMAL HIGH (ref 0.1–1.0)
Monocytes Relative: 8 %
Neutro Abs: 9.2 10*3/uL — ABNORMAL HIGH (ref 1.7–7.7)
Neutrophils Relative %: 67 %
Platelet Count: 553 10*3/uL — ABNORMAL HIGH (ref 150–400)
RBC: 5.98 MIL/uL — ABNORMAL HIGH (ref 4.22–5.81)
RDW: 16.4 % — ABNORMAL HIGH (ref 11.5–15.5)
WBC Count: 13.9 10*3/uL — ABNORMAL HIGH (ref 4.0–10.5)
nRBC: 0 % (ref 0.0–0.2)

## 2018-12-28 NOTE — Telephone Encounter (Signed)
Pt currently checking in for appt.

## 2018-12-28 NOTE — Progress Notes (Signed)
[place] [phone]   Fax:([phone]  CONSULT NOTE  REFERRING PHYSICIAN: Dr. [name]  REASON FOR CONSULTATION:  78 years old African-American male recently diagnosed with lung cancer  HPI Leamon Gowan is a 78 y.o. male with past medical history significant for severe dementia, hypertension, dyslipidemia as well as joint replacement and long history for smoking.  The patient was complaining of groin pain and he presented to the emergency department for evaluation.  During his evaluation he had CT scan of the abdomen pelvis on December 14, 2018 and that showed small right-sided pleural effusion with atelectasis and consolidation of the right middle lobe with incompletely visualized heterogeneous masslike density in the right hilar/infrahilar region with central low attenuation.  Primary concern is for necrotic lung mass/carcinoma with postobstructive atelectasis or pneumonia of the right middle lobe.  There was also a small right pleural effusion with probable atelectasis at the right posterior lung base and suspicious polypoid mass is arising from the posterior wall of the bladder versus prostate gland.CT scan of the chest with contrast was performed on the same day and that showed large central right necrotic mass measuring approximately 5.3 x 4.3 cm concerning for primary lung cancer.  There was postobstructive atelectasis or pneumonia in the right middle lobe and a small right pleural effusion.  There was also right hilar and mediastinal lymphadenopathy.  The patient was seen by Dr. Vaughan Browner and he underwent bronchoscopy on 12/16/2018 and the final pathology ([id number]) was consistent with squamous cell carcinoma. Immunohistochemistry is positive for p63. TTF-1 and NapsinA are negative. 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 The patient was referred to me today for evaluation and recommendation regarding treatment of his condition.  He has  significant dementia and his brother Eddie Dibbles who lives in [place] was on the phone during the visit and he provided the history and contributes to the discussion of his treatment. When seen today the patient is feeling fine with no concerning complaints except for mild fatigue and shortness of breath with exertion. He wanted to go home from the moment he arrived in the office. The patient has no nausea, vomiting, diarrhea or constipation.  He denied having any chest pain or hemoptysis.  He denied having any fever or chills.  He has no headache or visual changes. Family history significant for mother died from stomach cancer at age 61, father had COPD, 2 brother had lung cancer 1 of them died recently and he was the patient's twin brother. The patient is single and has 3 living children 1 son and 2 daughters.  He is currently on disability.  He has a history of smoking for over 60 years and unfortunately continues to smoke few cigarettes every day.  He also has a history of alcohol abuse and no history of drug abuse.  HPI  Past Medical History:  Diagnosis Date   Dementia ([place])    High cholesterol    Hypertension    Lung mass 12/2018    Past Surgical History:  Procedure Laterality Date   JOINT REPLACEMENT     VIDEO BRONCHOSCOPY WITH ENDOBRONCHIAL ULTRASOUND N/A 12/16/2018   Procedure: VIDEO BRONCHOSCOPY WITH ENDOBRONCHIAL ULTRASOUND AND FLUROSCOPY;  Surgeon: [name], MD;  Location: [facility];  Service: Pulmonary;  Laterality: N/A;    Family History  Problem Relation Age of Onset   Stomach cancer Mother    COPD Father    Lung cancer Brother    Lung cancer Brother  Social History Social History   Tobacco Use   Smoking status: Current Every Day Smoker    Packs/day: 1.00    Years: 60.00    Pack years: 60.00    Types: Cigarettes   Smokeless tobacco: Never Used  Substance Use Topics   Alcohol use: Yes    Comment: occasional   Drug use: No    No Known  Allergies  Current Outpatient Medications  Medication Sig Dispense Refill   acetaminophen (TYLENOL) 500 MG tablet Take 2 tablets (1,000 mg total) by mouth every 8 (eight) hours as needed for mild pain. 20 tablet 0   amoxicillin-clavulanate (AUGMENTIN) 875-125 MG tablet Take 1 tablet by mouth every 12 (twelve) hours for 14 days. 28 tablet 0   aspirin (ASPIRIN 81) 81 MG EC tablet Take 1 tablet (81 mg total) by mouth daily. 90 tablet 3   atorvastatin (LIPITOR) 20 MG tablet Take 1 tablet (20 mg total) by mouth every evening. 30 tablet 0   HYDROcodone-acetaminophen (NORCO/VICODIN) 5-325 MG tablet Take 1 tablet by mouth every 4 (four) hours as needed for moderate pain. 12 tablet 0   losartan (COZAAR) 50 MG tablet Take 1 tablet (50 mg total) by mouth daily. 30 tablet 0   nicotine (NICODERM CQ - DOSED IN MG/24 HR) 7 mg/24hr patch Place 1 patch (7 mg total) onto the skin daily. 28 patch 0   Omega-3 Fatty Acids (FISH OIL PO) Take 1 capsule by mouth daily.     No current facility-administered medications for this visit.     Review of Systems  Constitutional: positive for fatigue Eyes: negative Ears, nose, mouth, throat, and face: negative Respiratory: positive for cough and dyspnea on exertion Cardiovascular: negative Gastrointestinal: negative Genitourinary:negative Integument/breast: negative Hematologic/lymphatic: negative Musculoskeletal:positive for arthralgias and muscle weakness Neurological: negative Behavioral/Psych: negative Endocrine: negative Allergic/Immunologic: negative  Physical Exam  AST:MHDQQ, healthy, no distress, well nourished and well developed SKIN: skin color, texture, turgor are normal, no rashes or significant lesions HEAD: Normocephalic, No masses, lesions, tenderness or abnormalities EYES: normal, PERRLA, Conjunctiva are pink and non-injected EARS: External ears normal, Canals clear OROPHARYNX:no exudate, no erythema and lips, buccal mucosa, and tongue  normal  NECK: supple, no adenopathy, no JVD LYMPH:  no palpable lymphadenopathy, no hepatosplenomegaly LUNGS: coarse sounds heard, decreased breath sounds HEART: regular rate & rhythm, no murmurs and no gallops ABDOMEN:abdomen soft, non-tender, normal bowel sounds and no masses or organomegaly BACK: No CVA tenderness, Range of motion is normal EXTREMITIES:no joint deformities, effusion, or inflammation, no edema  NEURO: alert & oriented x 3 with fluent speech, no focal motor/sensory deficits  PERFORMANCE STATUS: ECOG 1  LABORATORY DATA: Lab Results  Component Value Date   WBC 13.9 (H) 12/28/2018   HGB 12.5 (L) 12/28/2018   HCT 37.8 (L) 12/28/2018   MCV 63.2 (L) 12/28/2018   PLT 553 (H) 12/28/2018      Chemistry      Component Value Date/Time   NA 142 12/28/2018 1428   NA 145 (H) 10/29/2016 1513   K 4.3 12/28/2018 1428   CL 107 12/28/2018 1428   CO2 26 12/28/2018 1428   BUN 13 12/28/2018 1428   BUN 12 10/29/2016 1513   CREATININE 0.75 12/28/2018 1428      Component Value Date/Time   CALCIUM 9.9 12/28/2018 1428   ALKPHOS 89 12/28/2018 1428   AST 16 12/28/2018 1428   ALT 21 12/28/2018 1428   BILITOT 0.3 12/28/2018 1428       RADIOGRAPHIC STUDIES: Dg  Chest 1 View  Result Date: 12/16/2018 CLINICAL DATA:  Status post bronchoscopy EXAM: CHEST  1 VIEW COMPARISON:  12/14/2018 FINDINGS: Cardiac shadow is stable. No pneumothorax is noted following bronchoscopy. Persistent consolidation and central mass is seen in the right lung base similar to that seen on prior CT. The left lung remains clear. No bony abnormality is seen. IMPRESSION: No post bronchoscopy pneumothorax. Electronically Signed   By: Inez Catalina M.D.   On: 12/16/2018 17:16   Ct Chest W Contrast  Result Date: 12/14/2018 CLINICAL DATA:  Lung mass EXAM: CT CHEST WITH CONTRAST TECHNIQUE: Multidetector CT imaging of the chest was performed during intravenous contrast administration. CONTRAST:  27mL OMNIPAQUE IOHEXOL  300 MG/ML  SOLN COMPARISON:  Abdominal CT earlier today FINDINGS: Cardiovascular: Heart is normal size. Aorta normal caliber. Coronary artery and aortic calcifications. Mediastinum/Nodes: Mediastinal and right hilar adenopathy. Low right paratracheal lymph node has a short axis diameter of 10 mm. Other similarly sized and smaller mediastinal lymph nodes. Right hilar adenopathy has short axis diameter of 13 mm. Lungs/Pleura: Large central right necrotic mass noted. There is postobstructive atelectasis or pneumonia in the right middle lobe as well as small right pleural effusion. The central right lung mass is low-density centrally suggesting necrosis. It is difficult to accurately measure due to postobstructive airspace disease but measures approximately 5.3 x 4.3 cm on image 91. No confluent opacity or effusion on the left. Linear atelectasis or scarring at the left base. Moderate emphysema. Upper Abdomen: Imaging into the upper abdomen shows no acute findings. Musculoskeletal: No acute bony abnormality. IMPRESSION: Large central right necrotic mass measuring approximately 5.3 x 4.3 cm concerning for primary lung cancer. Postobstructive atelectasis or pneumonia in the right middle lobe. Small right pleural effusion. Right hilar and mediastinal adenopathy. Coronary artery disease. Aortic Atherosclerosis (ICD10-I70.0) and Emphysema (ICD10-J43.9). Electronically Signed   By: Rolm Baptise M.D.   On: 12/14/2018 23:40   Ct Abdomen Pelvis W Contrast  Addendum Date: 12/14/2018   ADDENDUM REPORT: 12/14/2018 23:35 ADDENDUM: Additional images were added to the original study. Delayed sequences of the extreme inferior pelvis are submitted. Only the inferior bladder is included. There is dense excreted contrast in the bladder which could potentially obscure lesion. Partially visualized prostate appears enlarged. Electronically Signed   By: Donavan Foil M.D.   On: 12/14/2018 23:35   Result Date: 12/14/2018 CLINICAL DATA:   Groin pain EXAM: CT ABDOMEN AND PELVIS WITH CONTRAST TECHNIQUE: Multidetector CT imaging of the abdomen and pelvis was performed using the standard protocol following bolus administration of intravenous contrast. CONTRAST:  156mL OMNIPAQUE IOHEXOL 300 MG/ML  SOLN COMPARISON:  None FINDINGS: Lower chest: Small right-sided pleural effusion. Partial atelectasis or mild pneumonia in the right lung base. Incompletely visualized heterogenous masslike density in the right hilar to infrahilar lung with central low attenuation, possibly representing necrotic mass. This measures 4 cm transverse on coronal views, the cephalad extent is incompletely imaged. Postobstructive atelectasis or pneumonia in the right middle lobe. Abrupt occlusion of the right middle lobe bronchus. Heart size is normal. Hepatobiliary: No focal liver abnormality is seen. No gallstones, gallbladder wall thickening, or biliary dilatation. Pancreas: Unremarkable. No pancreatic ductal dilatation or surrounding inflammatory changes. Spleen: Normal in size without focal abnormality. Adrenals/Urinary Tract: Adrenal glands are normal. Kidneys are within normal limits. No hydronephrosis. Bladder largely obscured by artifact from left hip hardware. Bladder may be thick-walled. Suspicion of polypoid mass either arising from prostate or the posterior wall of the bladder. Stomach/Bowel: The stomach  is nonenlarged. No dilated small bowel. No bowel wall thickening. Negative appendix. Vascular/Lymphatic: Extensive aortic atherosclerosis without aneurysm. Heavily calcified distal common iliac, external iliac and internal iliac arteries with possible segmental occlusion of left proximal external iliac artery. No significantly enlarged lymph nodes. Reproductive: Prostate is likely enlarged. Possible polypoid projection off the anterior prostate. Other: Negative for free air or free fluid Musculoskeletal: Left hip replacement with artifact. Diffuse degenerative changes of  the spine. No acute osseous abnormality. IMPRESSION: 1. Small right-sided pleural effusion. Atelectasis/consolidation of the right middle lobe with incompletely visualized heterogenous masslike density in the right hilar/infrahilar region with central low attenuation. Primary concern is for necrotic lung mass/carcinoma with postobstructive atelectasis or pneumonia of the right middle lobe. Dedicated chest CT, preferably with contrast, when clinically feasible is recommended. 2. Small right-sided pleural effusion with probable atelectasis at the right posterior lung base. 3. Suspicion of polypoid mass either arising from posterior wall of the bladder versus the prostate gland. Further characterisation limited due to artifact from left hip hardware. 4. 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 Electronically Signed: By: Donavan Foil M.D. On: 12/14/2018 22:04   US Scrotum W/doppler  Result Date: 12/14/2018 CLINICAL DATA:  78 year old male with testicular pain. EXAM: SCROTAL ULTRASOUND DOPPLER ULTRASOUND OF THE TESTICLES TECHNIQUE: Complete ultrasound examination of the testicles, epididymis, and other scrotal structures was performed. Color and spectral Doppler ultrasound were also utilized to evaluate blood flow to the testicles. COMPARISON:  CT Abdomen and Pelvis today are reported separately. FINDINGS: Right testicle Measurements: 3.4 x 1.8 x 1.8 centimeters. Heterogeneous echotexture (image 2) but no discrete testicular mass. Left testicle Measurements: 3.7 x 1.7 x 1.9 centimeters. Heterogeneous echotexture (image 26) but no discrete testicular mass. Right epididymis:  Normal in size and appearance. Left epididymis:  Could not be identified. Hydrocele:  None visualized. Varicocele:  None visualized. Pulsed Doppler interrogation of both testes demonstrates normal low resistance arterial and venous waveforms  bilaterally. IMPRESSION: 1. No evidence of testicular torsion. 2. Symmetric heterogeneity of both testes with no discrete testicular mass. Electronically Signed   By: Genevie Ann M.D.   On: 12/14/2018 21:52   Dg C-arm Bronchoscopy  Result Date: 12/16/2018 C-ARM BRONCHOSCOPY: Fluoroscopy was utilized by the requesting physician.  No radiographic interpretation.    ASSESSMENT: This is a very pleasant and demented 78 years old African-American male recently diagnosed with at least stage IIIA (T3, N2, M0) non-small cell lung cancer, squamous cell carcinoma presented with large right middle lobe lung mass with postobstructive pneumonia as well as right hilar and mediastinal lymphadenopathy diagnosed in July 2020, pending further staging work-up.  This could be also stage IV if the right pleural effusion is malignant or if the patient has any other metastatic disease.   PLAN: I had a lengthy discussion with the patient and his brother Eddie Dibbles who was available by phone during the visit about his current disease stage, prognosis and treatment options. I personally and independently reviewed the scan images and discussed the results with the patient and his brother. I recommended for him to complete the staging work-up by ordering a PET scan as well as MRI of the brain to rule out any other metastatic disease. I recommended for the patient to proceed with a course of concurrent chemoradiation with weekly carboplatin for AUC of 2 and paclitaxel 45 mg/M2 followed by consolidation immunotherapy if there is no evidence for metastatic disease on the  imaging studies. I discussed with the patient and his brother the adverse effect of this treatment including but not limited to alopecia, myelosuppression, nausea and vomiting, peripheral neuropathy, liver or renal dysfunction. I will refer the patient to radiation oncology for evaluation and recommendation regarding the radiotherapy. I will see the patient back for follow-up  visit in around 2 weeks for evaluation and more detailed discussion of his treatment options based on the final imaging studies. The patient was advised to call immediately if he has any concerning symptoms in the interval. I strongly recommend for him to quit smoking. The patient voices understanding of current disease status and treatment options and is in agreement with the current care plan.  All questions were answered. The patient knows to call the clinic with any problems, questions or concerns. We can certainly see the patient much sooner if necessary.  Thank you so much for allowing me to participate in the care of Richard [name]. I will continue to follow up the patient with you and assist in his care.  I spent 55 minutes counseling the patient face to face. The total time spent in the appointment was 80 minutes.  Disclaimer: This note was dictated with voice recognition software. Similar sounding words can inadvertently be transcribed and may not be corrected upon review.   Eilleen Kempf December 28, 2018, 2:37 PM

## 2018-12-29 ENCOUNTER — Encounter: Payer: Self-pay | Admitting: Primary Care

## 2018-12-29 ENCOUNTER — Telehealth: Payer: Self-pay | Admitting: *Deleted

## 2018-12-29 ENCOUNTER — Ambulatory Visit (INDEPENDENT_AMBULATORY_CARE_PROVIDER_SITE_OTHER): Payer: Medicare HMO | Admitting: Primary Care

## 2018-12-29 ENCOUNTER — Telehealth: Payer: Self-pay | Admitting: Internal Medicine

## 2018-12-29 DIAGNOSIS — J189 Pneumonia, unspecified organism: Secondary | ICD-10-CM

## 2018-12-29 DIAGNOSIS — R918 Other nonspecific abnormal finding of lung field: Secondary | ICD-10-CM

## 2018-12-29 DIAGNOSIS — R630 Anorexia: Secondary | ICD-10-CM

## 2018-12-29 NOTE — Telephone Encounter (Signed)
Spoke with daughter - pt is aware of appt date and time per 7/14 los.

## 2018-12-29 NOTE — Progress Notes (Signed)
@Patient  ID: Richard [name], male    DOB: Oct 07, 1940, 78 y.o.   MRN: [id number]  Chief Complaint  Patient presents with   Hospitalization [facility] follow up.     Referring provider: Tawny Asal  HPI: 78 year old male, current smoker. PMH hypertension, dementia significant for obstructive pneumonia, lung mass, pelvic mass Admitted to the hospital 6/30-12/17/18 for right groin pain and anorexia.CT chest showed large central right necrotic mass measuring approx 5.3x4.3 cm concerning for primary lung cancer. He underwent tissue sampling via endobronchial ultrasound guided nodal biopsies. Samples were taken from RML. 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   12/29/2018  Patient presents today for hospital follow-up. Accompanied by his daughter. This appointment was to go over pathyology results which showed squamous cell carcinoma right lung, Dr. Vaughan Browner has already spoken with patient/daughter over the phone and has referred patient to oncology. He is ok, appears very tired and weak. In wheelchair. Reports decreased/poor appetite. Discussed high protein sources and boost/ensure supplments. He continues Augmentin until completed. Taking Norco for moderate pain only. Denies sob or cough. PET scan scheduled for 7/21 with BRAIN MRI.   No Known Allergies  Immunization History  Administered Date(s) Administered   Influenza,inj,Quad PF,6+ Mos 03/18/2018   Tdap 10/29/2016    Past Medical History:  Diagnosis Date   Dementia ([place])    High cholesterol    Hypertension    Lung mass 12/2018    Tobacco History: Social History   Tobacco Use  Smoking Status Current Some Day Smoker   Packs/day: 1.00   Years: 60.00   Pack years: 60.00   Types: Cigarettes  Smokeless Tobacco Never Used   Ready to quit: Not Answered Counseling given: Not Answered   Outpatient Medications Prior to Visit  Medication Sig Dispense Refill    amoxicillin-clavulanate (AUGMENTIN) 875-125 MG tablet Take 1 tablet by mouth every 12 (twelve) hours for 14 days. 28 tablet 0   aspirin (ASPIRIN 81) 81 MG EC tablet Take 1 tablet (81 mg total) by mouth daily. 90 tablet 3   HYDROcodone-acetaminophen (NORCO/VICODIN) 5-325 MG tablet Take 1 tablet by mouth every 4 (four) hours as needed for moderate pain. 12 tablet 0   nicotine (NICODERM CQ - DOSED IN MG/24 HR) 7 mg/24hr patch Place 1 patch (7 mg total) onto the skin daily. (Patient not taking: Reported on 12/31/2018) 28 patch 0   Omega-3 Fatty Acids (FISH OIL PO) Take 1 capsule by mouth daily.     acetaminophen (TYLENOL) 500 MG tablet Take 2 tablets (1,000 mg total) by mouth every 8 (eight) hours as needed for mild pain. 20 tablet 0   atorvastatin (LIPITOR) 20 MG tablet Take 1 tablet (20 mg total) by mouth every evening. 30 tablet 0   losartan (COZAAR) 50 MG tablet Take 1 tablet (50 mg total) by mouth daily. 30 tablet 0   No facility-administered medications prior to visit.    Review of Systems  Review of Systems  Constitutional: Positive for appetite change and fatigue.  Respiratory: Negative for cough and shortness of breath.   Musculoskeletal: Positive for arthralgias.   Physical Exam  BP 112/68    Pulse 88    Temp 97.9 F (36.6 C) (Oral)    Ht 6\' 3"  (1.905 m)    Wt 152 lb 6.4 oz (69.1 kg)    SpO2 98%    BMI 19.05 kg/m  Physical Exam Constitutional:      General: He is not  in acute distress.    Appearance: He is normal weight. He is ill-appearing.  HENT:     Head: Normocephalic and atraumatic.  Cardiovascular:     Rate and Rhythm: Normal rate.  Pulmonary:     Effort: Pulmonary effort is normal. No respiratory distress.     Breath sounds: No wheezing or rales.     Comments: Rhonchi right base No sob or cough Musculoskeletal:     Comments: In WC  Neurological:     General: No focal deficit present.     Mental Status: He is alert. Mental status is at baseline.    Psychiatric:     Comments: Flat affect      Lab Results:  CBC    Component Value Date/Time   WBC 13.9 (H) 12/28/2018 1428   WBC 14.1 (H) 12/17/2018 0323   RBC 5.98 (H) 12/28/2018 1428   HGB 12.5 (L) 12/28/2018 1428   HGB 14.5 10/29/2016 1513   HCT 37.8 (L) 12/28/2018 1428   HCT 44.6 10/29/2016 1513   PLT 553 (H) 12/28/2018 1428   PLT 278 10/29/2016 1513   MCV 63.2 (L) 12/28/2018 1428   MCV 67 (L) 10/29/2016 1513   MCH 20.9 (L) 12/28/2018 1428   MCHC 33.1 12/28/2018 1428   RDW 16.4 (H) 12/28/2018 1428   RDW 19.9 (H) 10/29/2016 1513   LYMPHSABS 3.5 12/28/2018 1428   LYMPHSABS 3.1 10/29/2016 1513   MONOABS 1.1 (H) 12/28/2018 1428   EOSABS 0.0 12/28/2018 1428   EOSABS 0.0 10/29/2016 1513   BASOSABS 0.1 12/28/2018 1428   BASOSABS 0.0 10/29/2016 1513    BMET    Component Value Date/Time   NA 142 12/28/2018 1428   NA 145 (H) 10/29/2016 1513   K 4.3 12/28/2018 1428   CL 107 12/28/2018 1428   CO2 26 12/28/2018 1428   GLUCOSE 95 12/28/2018 1428   BUN 13 12/28/2018 1428   BUN 12 10/29/2016 1513   CREATININE 0.75 12/28/2018 1428   CALCIUM 9.9 12/28/2018 1428   GFRNONAA >60 12/28/2018 1428   GFRAA >60 12/28/2018 1428    BNP No results found for: BNP  ProBNP No results found for: PROBNP  Imaging: Dg Chest 1 View  Result Date: 12/16/2018 CLINICAL DATA:  Status post bronchoscopy EXAM: CHEST  1 VIEW COMPARISON:  12/14/2018 FINDINGS: Cardiac shadow is stable. No pneumothorax is noted following bronchoscopy. Persistent consolidation and central mass is seen in the right lung base similar to that seen on prior CT. The left lung remains clear. No bony abnormality is seen. IMPRESSION: No post bronchoscopy pneumothorax. Electronically Signed   By: Inez Catalina M.D.   On: 12/16/2018 17:16   Ct Chest W Contrast  Result Date: 12/14/2018 CLINICAL DATA:  Lung mass EXAM: CT CHEST WITH CONTRAST TECHNIQUE: Multidetector CT imaging of the chest was performed during intravenous  contrast administration. CONTRAST:  86mL OMNIPAQUE IOHEXOL 300 MG/ML  SOLN COMPARISON:  Abdominal CT earlier today FINDINGS: Cardiovascular: Heart is normal size. Aorta normal caliber. Coronary artery and aortic calcifications. Mediastinum/Nodes: Mediastinal and right hilar adenopathy. Low right paratracheal lymph node has a short axis diameter of 10 mm. Other similarly sized and smaller mediastinal lymph nodes. Right hilar adenopathy has short axis diameter of 13 mm. Lungs/Pleura: Large central right necrotic mass noted. There is postobstructive atelectasis or pneumonia in the right middle lobe as well as small right pleural effusion. The central right lung mass is low-density centrally suggesting necrosis. It is difficult to accurately measure due to postobstructive  airspace disease but measures approximately 5.3 x 4.3 cm on image 91. No confluent opacity or effusion on the left. Linear atelectasis or scarring at the left base. Moderate emphysema. Upper Abdomen: Imaging into the upper abdomen shows no acute findings. Musculoskeletal: No acute bony abnormality. IMPRESSION: Large central right necrotic mass measuring approximately 5.3 x 4.3 cm concerning for primary lung cancer. Postobstructive atelectasis or pneumonia in the right middle lobe. Small right pleural effusion. Right hilar and mediastinal adenopathy. Coronary artery disease. Aortic Atherosclerosis (ICD10-I70.0) and Emphysema (ICD10-J43.9). Electronically Signed   By: Rolm Baptise M.D.   On: 12/14/2018 23:40   Mr Jeri Cos JJ Contrast  Result Date: 01/04/2019 CLINICAL DATA:  Non-small cell lung cancer.  Staging. EXAM: MRI HEAD WITHOUT AND WITH CONTRAST TECHNIQUE: Multiplanar, multiecho pulse sequences of the brain and surrounding structures were obtained without and with intravenous contrast. CONTRAST:  6 cc Gadavist COMPARISON:  None. FINDINGS: Brain: Diffusion imaging does not show any acute or subacute infarction. No focal abnormality affects the  brainstem or cerebellum. Cerebral hemispheres show advanced generalized atrophy. There is old cortical and subcortical infarction in the right occipital lobe and posterior parietal lobe. Mild chronic small-vessel ischemic change of the deep white matter. The ventricles are quite prominent, in proportion to the degree of generalized atrophy. Normal pressure hydrocephalus not excluded but not favored. No mass lesion, hemorrhage or extra-axial collection. After contrast administration, no abnormal enhancement occurs. Vascular: Major vessels at the base of the brain show flow. Skull and upper cervical spine: Negative Sinuses/Orbits: Clear/normal Other: None IMPRESSION: No evidence metastatic disease. Advanced chronic generalized brain atrophy and ventriculomegaly. 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 Electronically Signed   By: Nelson Chimes M.D.   On: 01/04/2019 17:30   Ct Abdomen Pelvis W Contrast  Addendum Date: 12/14/2018   ADDENDUM REPORT: 12/14/2018 23:35 ADDENDUM: Additional images were added to the original study. Delayed sequences of the extreme inferior pelvis are submitted. Only the inferior bladder is included. There is dense excreted contrast in the bladder which could potentially obscure lesion. Partially visualized prostate appears enlarged. Electronically Signed   By: Donavan Foil M.D.   On: 12/14/2018 23:35   Result Date: 12/14/2018 CLINICAL DATA:  Groin pain EXAM: CT ABDOMEN AND PELVIS WITH CONTRAST TECHNIQUE: Multidetector CT imaging of the abdomen and pelvis was performed using the standard protocol following bolus administration of intravenous contrast. CONTRAST:  143mL OMNIPAQUE IOHEXOL 300 MG/ML  SOLN COMPARISON:  None FINDINGS: Lower chest: Small right-sided pleural effusion. Partial atelectasis or mild pneumonia in the right lung base. Incompletely visualized heterogenous masslike density in the right hilar to infrahilar lung with central low  attenuation, possibly representing necrotic mass. This measures 4 cm transverse on coronal views, the cephalad extent is incompletely imaged. Postobstructive atelectasis or pneumonia in the right middle lobe. Abrupt occlusion of the right middle lobe bronchus. Heart size is normal. Hepatobiliary: No focal liver abnormality is seen. No gallstones, gallbladder wall thickening, or biliary dilatation. Pancreas: Unremarkable. No pancreatic ductal dilatation or surrounding inflammatory changes. Spleen: Normal in size without focal abnormality. Adrenals/Urinary Tract: Adrenal glands are normal. Kidneys are within normal limits. No hydronephrosis. Bladder largely obscured by artifact from left hip hardware. Bladder may be thick-walled. Suspicion of polypoid mass either arising from prostate or the posterior wall of the bladder. Stomach/Bowel: The stomach is nonenlarged. No dilated small bowel. No bowel wall thickening. Negative appendix. Vascular/Lymphatic: Extensive aortic atherosclerosis without aneurysm. Heavily calcified distal common iliac, external iliac and  internal iliac arteries with possible segmental occlusion of left proximal external iliac artery. No significantly enlarged lymph nodes. Reproductive: Prostate is likely enlarged. Possible polypoid projection off the anterior prostate. Other: Negative for free air or free fluid Musculoskeletal: Left hip replacement with artifact. Diffuse degenerative changes of the spine. No acute osseous abnormality. IMPRESSION: 1. Small right-sided pleural effusion. Atelectasis/consolidation of the right middle lobe with incompletely visualized heterogenous masslike density in the right hilar/infrahilar region with central low attenuation. Primary concern is for necrotic lung mass/carcinoma with postobstructive atelectasis or pneumonia of the right middle lobe. Dedicated chest CT, preferably with contrast, when clinically feasible is recommended. 2. Small right-sided pleural  effusion with probable atelectasis at the right posterior lung base. 3. Suspicion of polypoid mass either arising from posterior wall of the bladder versus the prostate gland. Further characterisation limited due to artifact from left hip hardware. 4. 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 Electronically Signed: By: Donavan Foil M.D. On: 12/14/2018 22:04   Nm Pet Image Initial (pi) Skull Base To Thigh  Result Date: 01/04/2019 CLINICAL DATA:  Initial treatment strategy for right lung mass. EXAM: NUCLEAR MEDICINE PET SKULL BASE TO THIGH TECHNIQUE: 8.2 mCi F-18 FDG was injected intravenously. Full-ring PET imaging was performed from the skull base to thigh after the radiotracer. CT data was obtained and used for attenuation correction and anatomic localization. Fasting blood glucose: 97 mg/dl COMPARISON:  Chest CT from 12/14/2018 FINDINGS: Mediastinal blood pool activity: SUV max 2.6 Liver activity: SUV max NA NECK: Unusually prominent but bilaterally symmetric activity in the salpingopharyngeus muscles bilaterally, probably incidental. Incidental CT findings: Prominent ventricular system, correlate with MRI brain. CHEST: The right hilar mass predominantly seems to have right upper lobe and perhaps right middle lobe components, has maximum SUV of 28.9. Associated hypermetabolic activity measures 5.3 by 4.9 cm. There is combination of volume loss and drowned lung appearance in the right middle lobe with generalized accentuated metabolic activity maximum SUV of about 7.7. A small right pleural effusion has low-grade metabolic activity, representative internal SUV 2.4. Right lower paratracheal lymph node at the level of the carina 1.2 cm in short axis, maximum SUV 4.9. A subcarinal node measuring 0.6 cm in short axis on image 82/4 has maximum SUV of 4.5. Incidental CT findings: Centrilobular emphysema. 5 mm left  lower lobe nodule in the posterior basal segment is not perceptibly hypermetabolic but is below sensitive PET-CT size thresholds. ABDOMEN/PELVIS: In upper portion of the right hepatic lobe, a small focus of accentuated metabolic activity is present, maximum SUV of 4.9, background liver activity has a maximum SUV of about 4.0. Incidental CT findings: Aortoiliac atherosclerotic vascular disease. SKELETON: In the vicinity of the inferior tip of the right scapula there is a small focus of accentuated metabolic activity, maximum SUV 5.0. I do not see an obvious underlying bony or soft tissue abnormality in the scapular tip or adjacent musculature. It could be that this is simply physiologic adjacent muscular activity but attention to this region is recommended. There is activity in the right antecubital fossa and adjacent vasculature likely related to small extravasation of radiopharmaceutical during injection. Incidental CT findings: Left total hip prosthesis. IMPRESSION: 1. Approximately 5.3 cm right hilar mass with upper lobe and likely middle lobe involvement, maximum SUV 28.9, compatible with malignancy. There is a mildly hypermetabolic right lower paratracheal lymph node and mild hypermetabolic activity in a small subcarinal lymph node. 2. Small  right pleural effusion with low-grade metabolic activity, representative internal SUV 2.4. This is indeterminate for malignancy. 3. The 5 mm left lower lobe nodule in the posterior basal segment is not perceptibly hypermetabolic but is below sensitive PET-CT size thresholds. 4. In the upper portion of the right hepatic lobe there is a small focus of metabolic activity, maximum SUV of 4.9 compared to background liver activity maximum SUV of 4.0. The appearance raises suspicion for a small metastatic lesion. This could be confirmed by hepatic protocol MRI with and without contrast, if clinically warranted. 5. Small focus of activity near the inferior tip of the right scapula.  No corresponding bony or soft tissue lesion is perceived on the CT data. This may simply be physiologic activity adjacent to the scapular tip but the area merits surveillance on follow up chest CT examinations. 6. Combination of volume loss and drowned lung appearance of the right middle lobe which has diffuse moderate activity which is likely inflammatory. 7. Other imaging findings of potential clinical significance: Aortic Atherosclerosis (ICD10-I70.0) and Emphysema (ICD10-J43.9). Left total hip prosthesis. Electronically Signed   By: Van Clines M.D.   On: 01/04/2019 16:43   US Scrotum W/doppler  Result Date: 12/14/2018 CLINICAL DATA:  78 year old male with testicular pain. EXAM: SCROTAL ULTRASOUND DOPPLER ULTRASOUND OF THE TESTICLES TECHNIQUE: Complete ultrasound examination of the testicles, epididymis, and other scrotal structures was performed. Color and spectral Doppler ultrasound were also utilized to evaluate blood flow to the testicles. COMPARISON:  CT Abdomen and Pelvis today are reported separately. FINDINGS: Right testicle Measurements: 3.4 x 1.8 x 1.8 centimeters. Heterogeneous echotexture (image 2) but no discrete testicular mass. Left testicle Measurements: 3.7 x 1.7 x 1.9 centimeters. Heterogeneous echotexture (image 26) but no discrete testicular mass. Right epididymis:  Normal in size and appearance. Left epididymis:  Could not be identified. Hydrocele:  None visualized. Varicocele:  None visualized. Pulsed Doppler interrogation of both testes demonstrates normal low resistance arterial and venous waveforms bilaterally. IMPRESSION: 1. No evidence of testicular torsion. 2. Symmetric heterogeneity of both testes with no discrete testicular mass. Electronically Signed   By: Genevie Ann M.D.   On: 12/14/2018 21:52   Dg C-arm Bronchoscopy  Result Date: 12/16/2018 C-ARM BRONCHOSCOPY: Fluoroscopy was utilized by the requesting physician.  No radiographic interpretation.     Assessment &  Plan:   Lung mass - CT chest 6/30 showed large central right necrotic mass measuring approx 5.3x4.3 cm concerning for primary lung cancer - Underwent tissue sampling via endobronchial ultrasound guided nodal biopsies. - Pathology positive for squamous cell carcinoma, right lung  - Referred to oncology  - PET scan and Brain MRI scheduled for 7/21   Obstructive pneumonia - Afebrile, no cough or shortness of breath  - Completed Doxycycline x 1 week - Continues Augmentin x2 weeks    Poor appetite - Encourage boost three times a day - Encouraged patient to discuss with Dr. Earlie Server about potentially trying Marinol for anorexia/appetite    Martyn Ehrich, NP 01/05/2019

## 2018-12-29 NOTE — Telephone Encounter (Signed)
Called to schedule consultation with Dr, Sondra Come according to Dr. Worthy Flank referral. Patient's daughter wants to wait and see what the results from the scans are before having a consultation.I have deferred the referral for now.

## 2018-12-29 NOTE — Patient Instructions (Signed)
Continue Augmentin until completed  Continue Norco 1 tab every 4 hours for moderate pain  PET scan and MRI scheduled for 7/21  Encourage boost three times a day  Discuss with Dr. Earlie Server about potentially trying Marinol for anorexia/appetite   Follow up: Dr. Vaughan Browner in 2-3 months

## 2018-12-30 DIAGNOSIS — E785 Hyperlipidemia, unspecified: Secondary | ICD-10-CM | POA: Diagnosis not present

## 2018-12-30 DIAGNOSIS — Z79891 Long term (current) use of opiate analgesic: Secondary | ICD-10-CM | POA: Diagnosis not present

## 2018-12-30 DIAGNOSIS — Z7982 Long term (current) use of aspirin: Secondary | ICD-10-CM | POA: Diagnosis not present

## 2018-12-30 DIAGNOSIS — I10 Essential (primary) hypertension: Secondary | ICD-10-CM | POA: Diagnosis not present

## 2018-12-30 DIAGNOSIS — C342 Malignant neoplasm of middle lobe, bronchus or lung: Secondary | ICD-10-CM | POA: Diagnosis not present

## 2018-12-30 DIAGNOSIS — J188 Other pneumonia, unspecified organism: Secondary | ICD-10-CM | POA: Diagnosis not present

## 2018-12-30 DIAGNOSIS — R69 Illness, unspecified: Secondary | ICD-10-CM | POA: Diagnosis not present

## 2018-12-31 ENCOUNTER — Ambulatory Visit (INDEPENDENT_AMBULATORY_CARE_PROVIDER_SITE_OTHER): Payer: Medicare HMO | Admitting: Primary Care

## 2018-12-31 ENCOUNTER — Encounter (INDEPENDENT_AMBULATORY_CARE_PROVIDER_SITE_OTHER): Payer: Self-pay | Admitting: Primary Care

## 2018-12-31 ENCOUNTER — Other Ambulatory Visit: Payer: Self-pay

## 2018-12-31 VITALS — BP 105/72 | HR 99 | Temp 97.8°F | Ht 75.0 in | Wt 152.0 lb

## 2018-12-31 DIAGNOSIS — G309 Alzheimer's disease, unspecified: Secondary | ICD-10-CM | POA: Diagnosis not present

## 2018-12-31 DIAGNOSIS — J188 Other pneumonia, unspecified organism: Secondary | ICD-10-CM | POA: Diagnosis not present

## 2018-12-31 DIAGNOSIS — Z7982 Long term (current) use of aspirin: Secondary | ICD-10-CM | POA: Diagnosis not present

## 2018-12-31 DIAGNOSIS — R69 Illness, unspecified: Secondary | ICD-10-CM | POA: Diagnosis not present

## 2018-12-31 DIAGNOSIS — E785 Hyperlipidemia, unspecified: Secondary | ICD-10-CM

## 2018-12-31 DIAGNOSIS — Z09 Encounter for follow-up examination after completed treatment for conditions other than malignant neoplasm: Secondary | ICD-10-CM

## 2018-12-31 DIAGNOSIS — F0280 Dementia in other diseases classified elsewhere without behavioral disturbance: Secondary | ICD-10-CM

## 2018-12-31 DIAGNOSIS — C3491 Malignant neoplasm of unspecified part of right bronchus or lung: Secondary | ICD-10-CM | POA: Diagnosis not present

## 2018-12-31 DIAGNOSIS — F17210 Nicotine dependence, cigarettes, uncomplicated: Secondary | ICD-10-CM

## 2018-12-31 DIAGNOSIS — Z79891 Long term (current) use of opiate analgesic: Secondary | ICD-10-CM | POA: Diagnosis not present

## 2018-12-31 DIAGNOSIS — I10 Essential (primary) hypertension: Secondary | ICD-10-CM

## 2018-12-31 DIAGNOSIS — C342 Malignant neoplasm of middle lobe, bronchus or lung: Secondary | ICD-10-CM | POA: Diagnosis not present

## 2018-12-31 MED ORDER — ATORVASTATIN CALCIUM 20 MG PO TABS: 20.0000 mg | ORAL_TABLET | Freq: Every evening | ORAL | 1 refills | Status: DC

## 2018-12-31 NOTE — Patient Instructions (Signed)
Dementia Caregiver Guide Dementia is a term used to describe a number of symptoms that affect memory and thinking. The most common symptoms include:  Memory loss.  Trouble with language and communication.  Trouble concentrating.  Poor judgment.  Problems with reasoning.  Child-like behavior and language.  Extreme anxiety.  Angry outbursts.  Wandering from home or public places. Dementia usually gets worse slowly over time. In the early stages, people with dementia can stay independent and safe with some help. In later stages, they need help with daily tasks such as dressing, grooming, and using the bathroom. How to help the person with dementia cope Dementia can be frightening and confusing. Here are some tips to help the person with dementia cope with changes caused by the disease. General tips  Keep the person on track with his or her routine.  Try to identify areas where the person may need help.  Be supportive, patient, calm, and encouraging.  Gently remind the person that adjusting to changes takes time.  Help with the tasks that the person has asked for help with.  Keep the person involved in daily tasks and decisions as much as possible.  Encourage conversation, but try not to get frustrated or harried if the person struggles to find words or does not seem to appreciate your help. Communication tips  When the person is talking or seems frustrated, make eye contact and hold the person's hand.  Ask specific questions that need yes or no answers.  Use simple words, short sentences, and a calm voice. Only give one direction at a time.  When offering choices, limit them to just 1 or 2.  Avoid correcting the person in a negative way.  If the person is struggling to find the right words, gently try to help him or her. How to recognize symptoms of stress Symptoms of stress in caregivers include:  Feeling frustrated or angry with the person with dementia.   Denying that the person has dementia or that his or her symptoms will not improve.  Feeling hopeless and unappreciated.  Difficulty sleeping.  Difficulty concentrating.  Feeling anxious, irritable, or depressed.  Developing stress-related health problems.  Feeling like you have too little time for your own life. Follow these instructions at home:   Make sure that you and the person you are caring for: ? Get regular sleep. ? Exercise regularly. ? Eat regular, nutritious meals. ? Drink enough fluid to keep your urine clear or pale yellow. ? Take over-the-counter and prescription medicines only as told by your health care providers. ? Attend all scheduled health care appointments.  Join a support group with others who are caregivers.  Ask about respite care resources so that you can have a regular break from the stress of caregiving.  Look for signs of stress in yourself and in the person you are caring for. If you notice signs of stress, take steps to manage it.  Consider any safety risks and take steps to avoid them.  Organize medications in a pill box for each day of the week.  Create a plan to handle any legal or financial matters. Get legal or financial advice if needed.  Keep a calendar in a central location to remind the person of appointments or other activities. Tips for reducing the risk of injury  Keep floors clear of clutter. Remove rugs, magazine racks, and floor lamps.  Keep hallways well lit, especially at night.  Put a handrail and nonslip mat in the bathtub or  shower.  Put childproof locks on cabinets that contain dangerous items, such as medicines, alcohol, guns, toxic cleaning items, sharp tools or utensils, matches, and lighters.  Put the locks in places where the person cannot see or reach them easily. This will help ensure that the person does not wander out of the house and get lost.  Be prepared for emergencies. Keep a list of emergency phone  numbers and addresses in a convenient area.  Remove car keys and lock garage doors so that the person does not try to get in the car and drive.  Have the person wear a bracelet that tracks locations and identifies the person as having memory problems. This should be worn at all times for safety. Where to find support: Many individuals and organizations offer support. These include:  Support groups for people with dementia and for caregivers.  Counselors or therapists.  Home health care services.  Adult day care centers. Where to find more information [employer]: [employer] Contact a health care provider if:  The person's health is rapidly getting worse.  You are no longer able to care for the person.  Caring for the person is affecting your physical and emotional health.  The person threatens himself or herself, you, or anyone else. Summary  Dementia is a term used to describe a number of symptoms that affect memory and thinking.  Dementia usually gets worse slowly over time.  Take steps to reduce the person's risk of injury, and to plan for future care.  Caregivers need support, relief from caregiving, and time for their own lives. This information is not intended to replace advice given to you by your health care provider. Make sure you discuss any questions you have with your health care provider. Document Released: 05/06/2016 Document Revised: 05/15/2017 Document Reviewed: 05/06/2016 Elsevier Patient Education  2020 [employer].

## 2018-12-31 NOTE — Progress Notes (Addendum)
Acute Office Visit  Subjective:    Patient ID: Richard [name], male    DOB: 01/11/1941, 78 y.o.   MRN: [id number]  Chief Complaint  Patient presents with  . Hospitalization Follow-up  . Establish Care    HPI Mr. Richard [name] is in today for  a hospital follow up. He presented to the emergency room for groin pain with blood in his pull up and cough.  Past medical history  tobacco abuse, dementia, hypertension, and hyperlipidemia.  Past Medical History:  Diagnosis Date  . Dementia ([place])   . High cholesterol   . Hypertension   . Lung mass 12/2018    Past Surgical History:  Procedure Laterality Date  . JOINT REPLACEMENT    . VIDEO BRONCHOSCOPY WITH ENDOBRONCHIAL ULTRASOUND N/A 12/16/2018   Procedure: VIDEO BRONCHOSCOPY WITH ENDOBRONCHIAL ULTRASOUND AND FLUROSCOPY;  Surgeon: [name], MD;  Location: [place];  Service: Pulmonary;  Laterality: N/A;    Family History  Problem Relation Age of Onset  . Stomach cancer Mother   . COPD Father   . Lung cancer Brother   . Lung cancer Brother     Social History   Socioeconomic History  . Marital status: Divorced    Spouse name: Not on file  . Number of children: Not on file  . Years of education: Not on file  . Highest education level: Not on file  Occupational History  . Not on file  Social Needs  . Financial resource strain: Not on file  . Food insecurity    Worry: Not on file    Inability: Not on file  . Transportation needs    Medical: Not on file    Non-medical: Not on file  Tobacco Use  . Smoking status: Current Some Day Smoker    Packs/day: 1.00    Years: 60.00    Pack years: 60.00    Types: Cigarettes  . Smokeless tobacco: Never Used  Substance and Sexual Activity  . Alcohol use: Yes    Comment: occasional  . Drug use: No  . Sexual activity: Not on file  Lifestyle  . Physical activity    Days per week: Not on file    Minutes per session: Not on file  . Stress: Not on file  Relationships  . Social  [name] on phone: Not on file    Gets together: Not on file    Attends religious service: Not on file    Active member of club or organization: Not on file    Attends meetings of clubs or organizations: Not on file    Relationship status: Not on file  . Intimate partner violence    Fear of current or ex partner: Not on file    Emotionally abused: Not on file    Physically abused: Not on file    Forced sexual activity: Not on file  Other Topics Concern  . Not on file  Social History Narrative  . Not on file    Outpatient Medications Prior to Visit  Medication Sig Dispense Refill  . aspirin (ASPIRIN 81) 81 MG EC tablet Take 1 tablet (81 mg total) by mouth daily. 90 tablet 3  . HYDROcodone-acetaminophen (NORCO/VICODIN) 5-325 MG tablet Take 1 tablet by mouth every 4 (four) hours as needed for moderate pain. 12 tablet 0  . atorvastatin (LIPITOR) 20 MG tablet Take 1 tablet (20 mg total) by mouth every evening. 30 tablet 0  . amoxicillin-clavulanate (AUGMENTIN) 875-125 MG tablet  Take 1 tablet by mouth every 12 (twelve) hours for 14 days. 28 tablet 0  . losartan (COZAAR) 50 MG tablet Take 1 tablet (50 mg total) by mouth daily. 30 tablet 0  . nicotine (NICODERM CQ - DOSED IN MG/24 HR) 7 mg/24hr patch Place 1 patch (7 mg total) onto the skin daily. (Patient not taking: Reported on 12/31/2018) 28 patch 0  . Omega-3 Fatty Acids (FISH OIL PO) Take 1 capsule by mouth daily.    Marland Kitchen acetaminophen (TYLENOL) 500 MG tablet Take 2 tablets (1,000 mg total) by mouth every 8 (eight) hours as needed for mild pain. 20 tablet 0   No facility-administered medications prior to visit.     No Known Allergies  Review of Systems  Constitutional: Positive for weight loss.  HENT: Positive for hearing loss.   Eyes: Positive for blurred vision.  Respiratory: Positive for cough and shortness of breath.   Gastrointestinal: Positive for constipation.  Genitourinary: Positive for frequency and urgency.   Neurological: Positive for dizziness and weakness.  Psychiatric/Behavioral: Positive for memory loss.       Objective:    Physical Exam  Constitutional:  Thin habitus   Eyes: EOM are normal.  Neck: Normal range of motion.  Cardiovascular: Normal rate and regular rhythm.  Pulmonary/Chest: Effort normal and breath sounds normal.  Abdominal: Soft. Bowel sounds are normal.  Musculoskeletal: Normal range of motion.  Neurological: He is alert.  Skin: Skin is warm and dry.  Psychiatric:  Memory loss    BP 105/72 (BP Location: Right Arm, Patient Position: Sitting, Cuff Size: Normal)   Pulse 99   Temp 97.8 F (36.6 C) (Tympanic)   Ht 6\' 3"  (1.905 m)   Wt 152 lb (68.9 kg)   SpO2 94%   BMI 19.00 kg/m  Wt Readings from Last 3 Encounters:  12/31/18 152 lb (68.9 kg)  12/29/18 152 lb 6.4 oz (69.1 kg)  12/28/18 152 lb 11.2 oz (69.3 kg)    Health Maintenance Due  Topic Date Due  . PNA vac Low Risk Adult (1 of 2 - PCV13) 09/01/2007    There are no preventive care reminders to display for this patient.   No results found for: TSH Lab Results  Component Value Date   WBC 13.9 (H) 12/28/2018   HGB 12.5 (L) 12/28/2018   HCT 37.8 (L) 12/28/2018   MCV 63.2 (L) 12/28/2018   PLT 553 (H) 12/28/2018   Lab Results  Component Value Date   NA 142 12/28/2018   K 4.3 12/28/2018   CO2 26 12/28/2018   GLUCOSE 95 12/28/2018   BUN 13 12/28/2018   CREATININE 0.75 12/28/2018   BILITOT 0.3 12/28/2018   ALKPHOS 89 12/28/2018   AST 16 12/28/2018   ALT 21 12/28/2018   PROT 7.9 12/28/2018   ALBUMIN 2.5 (L) 12/28/2018   CALCIUM 9.9 12/28/2018   ANIONGAP 9 12/28/2018   Lab Results  Component Value Date   CHOL 252 (H) 10/29/2016   Lab Results  Component Value Date   HDL 62 10/29/2016   Lab Results  Component Value Date   LDLCALC 152 (H) 10/29/2016   Lab Results  Component Value Date   TRIG 188 (H) 10/29/2016   Lab Results  Component Value Date   CHOLHDL 4.1 10/29/2016    No results found for: HGBA1C     Assessment & Plan:   Phi was seen today for hospitalization follow-up and establish care.  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 His daughter is his primary caretaker he is able to assist with dressing  and feeding himself.  Presently incontinent of bowel and bladder and wears briefs.  Daughter's main concern is father safety and care.  Hospital discharge follow-up Patient went to emergency room for groin pain and cough with evaluation he was diagnose with lung and bladder mass referred to oncology followed by Dr. Curt Bears.  Hypertension, unspecified type Mr. Wixom has not been on blood pressure medications for several months, approximately 3 months. In reviewing previous office visit blood pressure has been low without medication. At this time no indication for blood pressure medication.  Counseled on blood pressure low-sodium, DASH diet, medication compliance. Blood pressure is unremarkable without medication. Will monitor it.  Dyslipidemia  Healthy lifestyle diet of fruits vegetables fish nuts whole grains and low saturated fat . Foods high in cholesterol or liver, fatty meats,cheese, butter avocados, nuts and seeds, chocolate and fried foods. -     atorvastatin (LIPITOR) 20 MG tablet; Take 1 tablet (20 mg total) by mouth every evening.  Meds ordered this encounter  Medications  . atorvastatin (LIPITOR) 20 MG tablet    Sig: Take 1 tablet (20 mg total) by mouth every evening.    Dispense:  90 tablet    Refill:  1    Must have OV for further RF     [name], NP

## 2019-01-03 DIAGNOSIS — Z7982 Long term (current) use of aspirin: Secondary | ICD-10-CM | POA: Diagnosis not present

## 2019-01-03 DIAGNOSIS — C342 Malignant neoplasm of middle lobe, bronchus or lung: Secondary | ICD-10-CM | POA: Diagnosis not present

## 2019-01-03 DIAGNOSIS — J188 Other pneumonia, unspecified organism: Secondary | ICD-10-CM | POA: Diagnosis not present

## 2019-01-03 DIAGNOSIS — R69 Illness, unspecified: Secondary | ICD-10-CM | POA: Diagnosis not present

## 2019-01-03 DIAGNOSIS — E785 Hyperlipidemia, unspecified: Secondary | ICD-10-CM | POA: Diagnosis not present

## 2019-01-03 DIAGNOSIS — Z79891 Long term (current) use of opiate analgesic: Secondary | ICD-10-CM | POA: Diagnosis not present

## 2019-01-03 DIAGNOSIS — I10 Essential (primary) hypertension: Secondary | ICD-10-CM | POA: Diagnosis not present

## 2019-01-04 ENCOUNTER — Telehealth (INDEPENDENT_AMBULATORY_CARE_PROVIDER_SITE_OTHER): Payer: Self-pay | Admitting: Primary Care

## 2019-01-04 ENCOUNTER — Ambulatory Visit (HOSPITAL_COMMUNITY)
Admission: RE | Admit: 2019-01-04 | Discharge: 2019-01-04 | Disposition: A | Discharge status: Home / Self Care | Payer: Medicare HMO | Source: Ambulatory Visit | Attending: Internal Medicine | Admitting: Internal Medicine

## 2019-01-04 ENCOUNTER — Encounter: Payer: Self-pay | Admitting: *Deleted

## 2019-01-04 ENCOUNTER — Telehealth: Payer: Self-pay | Admitting: Internal Medicine

## 2019-01-04 ENCOUNTER — Other Ambulatory Visit: Payer: Self-pay

## 2019-01-04 ENCOUNTER — Other Ambulatory Visit (INDEPENDENT_AMBULATORY_CARE_PROVIDER_SITE_OTHER): Payer: Self-pay | Admitting: Primary Care

## 2019-01-04 DIAGNOSIS — C3490 Malignant neoplasm of unspecified part of unspecified bronchus or lung: Secondary | ICD-10-CM

## 2019-01-04 DIAGNOSIS — Z79899 Other long term (current) drug therapy: Secondary | ICD-10-CM | POA: Insufficient documentation

## 2019-01-04 DIAGNOSIS — R918 Other nonspecific abnormal finding of lung field: Secondary | ICD-10-CM | POA: Diagnosis not present

## 2019-01-04 LAB — GLUCOSE, CAPILLARY: Glucose-Capillary: 97 mg/dL (ref 70–99)

## 2019-01-04 MED ORDER — FLUDEOXYGLUCOSE F - 18 (FDG) INJECTION
8.2000 | Freq: Once | INTRAVENOUS | Status: AC | PRN
  Administered 2019-01-04: 8.2 via INTRAVENOUS

## 2019-01-04 MED ORDER — GADOBUTROL 1 MMOL/ML IV SOLN
6.0000 mL | Freq: Once | INTRAVENOUS | Status: AC | PRN
  Administered 2019-01-04: 6 mL via INTRAVENOUS

## 2019-01-04 NOTE — Telephone Encounter (Signed)
Updated medication list faxed. Nat Christen, CMA

## 2019-01-04 NOTE — Telephone Encounter (Signed)
Physical therapist Richard [name] called to request verbal orders for physical therapy  -Once a week for 3 weeks -Social worker referral  -Updated medication list faxed to- [phone] [name] p

## 2019-01-04 NOTE — Telephone Encounter (Signed)
R./s appt per MM request- blocked schedule at original appt time . Spoke with pt daughter and she is aware of time and date. If it doesn't work she will give [place] a call back.

## 2019-01-04 NOTE — Telephone Encounter (Signed)
Verbal was given for PT and Education officer, museum. Unclear if patient needs to be on BP medications or not. Please address and update medication list so that it can be faxed to [facility]. Nat Christen, CMA

## 2019-01-04 NOTE — Progress Notes (Signed)
Oncology Nurse Navigator Documentation  Oncology Nurse Navigator Flowsheets 01/04/2019  Navigator Location [facility]  Referral Date to RadOnc/MedOnc -  Navigator Encounter Type Other/I followed up on Richard [name]'s schedule. He needs to be seen with Rad Onc, Dr. Sondra Come. I contacted Rad Onc scheduling to help facilitate.   Treatment Phase Pre-Tx/Tx Discussion  Barriers/Navigation Needs Coordination of Care  Interventions Coordination of Care  Coordination of Care Other  Acuity Level 1  Time Spent with Patient 15

## 2019-01-05 ENCOUNTER — Encounter: Payer: Self-pay | Admitting: Primary Care

## 2019-01-05 ENCOUNTER — Ambulatory Visit: Payer: Medicare HMO | Admitting: Radiation Oncology

## 2019-01-05 ENCOUNTER — Institutional Professional Consult (permissible substitution): Payer: Medicare HMO | Admitting: Radiation Oncology

## 2019-01-05 ENCOUNTER — Telehealth: Payer: Self-pay | Admitting: *Deleted

## 2019-01-05 ENCOUNTER — Ambulatory Visit: Payer: Medicare HMO

## 2019-01-05 DIAGNOSIS — R630 Anorexia: Secondary | ICD-10-CM | POA: Insufficient documentation

## 2019-01-05 NOTE — Assessment & Plan Note (Signed)
-   Afebrile, no cough or shortness of breath  - Completed Doxycycline x 1 week - Continues Augmentin x2 weeks

## 2019-01-05 NOTE — Telephone Encounter (Signed)
Records faxed to [facility] - Release [phone]

## 2019-01-05 NOTE — Assessment & Plan Note (Signed)
-   Encourage boost three times a day - Encouraged patient to discuss with Dr. Earlie Server about potentially trying Marinol for anorexia/appetite

## 2019-01-05 NOTE — Assessment & Plan Note (Addendum)
-   CT chest 6/30 showed large central right necrotic mass measuring approx 5.3x4.3 cm concerning for primary lung cancer - Underwent tissue sampling via endobronchial ultrasound guided nodal biopsies. - Pathology positive for squamous cell carcinoma, right lung  - Referred to oncology  - PET scan and Brain MRI scheduled for 7/21

## 2019-01-06 DIAGNOSIS — J188 Other pneumonia, unspecified organism: Secondary | ICD-10-CM | POA: Diagnosis not present

## 2019-01-06 DIAGNOSIS — I10 Essential (primary) hypertension: Secondary | ICD-10-CM | POA: Diagnosis not present

## 2019-01-06 DIAGNOSIS — Z7982 Long term (current) use of aspirin: Secondary | ICD-10-CM | POA: Diagnosis not present

## 2019-01-06 DIAGNOSIS — Z79891 Long term (current) use of opiate analgesic: Secondary | ICD-10-CM | POA: Diagnosis not present

## 2019-01-06 DIAGNOSIS — C342 Malignant neoplasm of middle lobe, bronchus or lung: Secondary | ICD-10-CM | POA: Diagnosis not present

## 2019-01-06 DIAGNOSIS — R69 Illness, unspecified: Secondary | ICD-10-CM | POA: Diagnosis not present

## 2019-01-06 DIAGNOSIS — E785 Hyperlipidemia, unspecified: Secondary | ICD-10-CM | POA: Diagnosis not present

## 2019-01-07 DIAGNOSIS — Z7982 Long term (current) use of aspirin: Secondary | ICD-10-CM | POA: Diagnosis not present

## 2019-01-07 DIAGNOSIS — R69 Illness, unspecified: Secondary | ICD-10-CM | POA: Diagnosis not present

## 2019-01-07 DIAGNOSIS — E785 Hyperlipidemia, unspecified: Secondary | ICD-10-CM | POA: Diagnosis not present

## 2019-01-07 DIAGNOSIS — I10 Essential (primary) hypertension: Secondary | ICD-10-CM | POA: Diagnosis not present

## 2019-01-07 DIAGNOSIS — C342 Malignant neoplasm of middle lobe, bronchus or lung: Secondary | ICD-10-CM | POA: Diagnosis not present

## 2019-01-07 DIAGNOSIS — J188 Other pneumonia, unspecified organism: Secondary | ICD-10-CM | POA: Diagnosis not present

## 2019-01-07 DIAGNOSIS — Z79891 Long term (current) use of opiate analgesic: Secondary | ICD-10-CM | POA: Diagnosis not present

## 2019-01-10 DIAGNOSIS — Z7982 Long term (current) use of aspirin: Secondary | ICD-10-CM | POA: Diagnosis not present

## 2019-01-10 DIAGNOSIS — E785 Hyperlipidemia, unspecified: Secondary | ICD-10-CM | POA: Diagnosis not present

## 2019-01-10 DIAGNOSIS — Z79891 Long term (current) use of opiate analgesic: Secondary | ICD-10-CM | POA: Diagnosis not present

## 2019-01-10 DIAGNOSIS — I10 Essential (primary) hypertension: Secondary | ICD-10-CM | POA: Diagnosis not present

## 2019-01-10 DIAGNOSIS — J188 Other pneumonia, unspecified organism: Secondary | ICD-10-CM | POA: Diagnosis not present

## 2019-01-10 DIAGNOSIS — R69 Illness, unspecified: Secondary | ICD-10-CM | POA: Diagnosis not present

## 2019-01-10 DIAGNOSIS — C342 Malignant neoplasm of middle lobe, bronchus or lung: Secondary | ICD-10-CM | POA: Diagnosis not present

## 2019-01-11 ENCOUNTER — Other Ambulatory Visit: Payer: Self-pay | Admitting: Physician Assistant

## 2019-01-11 DIAGNOSIS — C3491 Malignant neoplasm of unspecified part of right bronchus or lung: Secondary | ICD-10-CM

## 2019-01-12 ENCOUNTER — Inpatient Hospital Stay: Payer: Medicare HMO

## 2019-01-12 ENCOUNTER — Other Ambulatory Visit: Payer: Self-pay

## 2019-01-12 ENCOUNTER — Other Ambulatory Visit: Payer: Medicare HMO

## 2019-01-12 ENCOUNTER — Ambulatory Visit: Payer: Medicare HMO | Admitting: Internal Medicine

## 2019-01-12 ENCOUNTER — Encounter: Payer: Self-pay | Admitting: Physician Assistant

## 2019-01-12 ENCOUNTER — Inpatient Hospital Stay (HOSPITAL_BASED_OUTPATIENT_CLINIC_OR_DEPARTMENT_OTHER): Payer: Medicare HMO | Admitting: Physician Assistant

## 2019-01-12 ENCOUNTER — Other Ambulatory Visit: Payer: Self-pay | Admitting: Internal Medicine

## 2019-01-12 VITALS — BP 114/66 | HR 96 | Temp 98.7°F | Resp 18 | Ht 75.0 in | Wt 152.3 lb

## 2019-01-12 DIAGNOSIS — J90 Pleural effusion, not elsewhere classified: Secondary | ICD-10-CM | POA: Diagnosis not present

## 2019-01-12 DIAGNOSIS — F17210 Nicotine dependence, cigarettes, uncomplicated: Secondary | ICD-10-CM | POA: Diagnosis not present

## 2019-01-12 DIAGNOSIS — R630 Anorexia: Secondary | ICD-10-CM

## 2019-01-12 DIAGNOSIS — C342 Malignant neoplasm of middle lobe, bronchus or lung: Secondary | ICD-10-CM

## 2019-01-12 DIAGNOSIS — C3491 Malignant neoplasm of unspecified part of right bronchus or lung: Secondary | ICD-10-CM

## 2019-01-12 DIAGNOSIS — Z7189 Other specified counseling: Secondary | ICD-10-CM

## 2019-01-12 DIAGNOSIS — R69 Illness, unspecified: Secondary | ICD-10-CM | POA: Diagnosis not present

## 2019-01-12 LAB — CMP (CANCER CENTER ONLY)
ALT: 21 U/L (ref 0–44)
AST: 20 U/L (ref 15–41)
Albumin: 2.4 g/dL — ABNORMAL LOW (ref 3.5–5.0)
Alkaline Phosphatase: 92 U/L (ref 38–126)
Anion gap: 8 (ref 5–15)
BUN: 13 mg/dL (ref 8–23)
CO2: 27 mmol/L (ref 22–32)
Calcium: 10.4 mg/dL — ABNORMAL HIGH (ref 8.9–10.3)
Chloride: 107 mmol/L (ref 98–111)
Creatinine: 0.79 mg/dL (ref 0.61–1.24)
GFR, Est AFR Am: >60 mL/min (ref >60)
GFR, Estimated: >60 mL/min (ref >60)
Glucose, Bld: 123 mg/dL — ABNORMAL HIGH (ref 70–99)
Potassium: 4.2 mmol/L (ref 3.5–5.1)
Sodium: 142 mmol/L (ref 135–145)
Total Bilirubin: 0.3 mg/dL (ref 0.3–1.2)
Total Protein: 8.2 g/dL — ABNORMAL HIGH (ref 6.5–8.1)

## 2019-01-12 LAB — CBC WITH DIFFERENTIAL (CANCER CENTER ONLY)
Abs Immature Granulocytes: 0.06 10*3/uL (ref 0.00–0.07)
Basophils Absolute: 0 10*3/uL (ref 0.0–0.1)
Basophils Relative: 0 %
Eosinophils Absolute: 0.1 10*3/uL (ref 0.0–0.5)
Eosinophils Relative: 0 %
HCT: 38.7 % — ABNORMAL LOW (ref 39.0–52.0)
Hemoglobin: 12.8 g/dL — ABNORMAL LOW (ref 13.0–17.0)
Immature Granulocytes: 0 %
Lymphocytes Relative: 24 %
Lymphs Abs: 3.4 10*3/uL (ref 0.7–4.0)
MCH: 20.4 pg — ABNORMAL LOW (ref 26.0–34.0)
MCHC: 33.1 g/dL (ref 30.0–36.0)
MCV: 61.8 fL — ABNORMAL LOW (ref 80.0–100.0)
Monocytes Absolute: 1.3 10*3/uL — ABNORMAL HIGH (ref 0.1–1.0)
Monocytes Relative: 9 %
Neutro Abs: 9.1 10*3/uL — ABNORMAL HIGH (ref 1.7–7.7)
Neutrophils Relative %: 67 %
Platelet Count: 589 10*3/uL — ABNORMAL HIGH (ref 150–400)
RBC: 6.26 MIL/uL — ABNORMAL HIGH (ref 4.22–5.81)
RDW: 15.8 % — ABNORMAL HIGH (ref 11.5–15.5)
WBC Count: 13.9 10*3/uL — ABNORMAL HIGH (ref 4.0–10.5)
nRBC: 0 % (ref 0.0–0.2)

## 2019-01-12 MED ORDER — PROCHLORPERAZINE MALEATE 10 MG PO TABS: 10.0000 mg | ORAL_TABLET | Freq: Four times a day (QID) | ORAL | 0 refills | Status: DC | PRN

## 2019-01-12 NOTE — Progress Notes (Signed)
START ON PATHWAY REGIMEN - Non-Small Cell Lung     Administer weekly:     Paclitaxel      Carboplatin   **Always confirm dose/schedule in your pharmacy ordering system**  Patient Characteristics: Stage III - Unresectable, PS = 0, 1 AJCC T Category: T3 Current Disease Status: No Distant Mets or Local Recurrence AJCC N Category: N2 AJCC M Category: M0 AJCC 8 Stage Grouping: IIIB ECOG Performance Status: 1 Intent of Therapy: Curative Intent, Discussed with Patient 

## 2019-01-12 NOTE — Progress Notes (Signed)
[facility] OFFICE PROGRESS NOTE  [name], NP [address] [place] [address]  DIAGNOSIS: At least stage IIIA (T3, N2, M0) non-small cell lung cancer, squamous cell carcinoma presented with large right middle lobe lung mass with postobstructive pneumonia as well as right hilar and mediastinal lymphadenopathy diagnosed in July 2020. He also has a suspicious small right pleural effusion and small indeterminate metabolic focus in the liver.   PRIOR THERAPY: None  CURRENT THERAPY: Concurrent chemoradiation with Carboplatin for an AUC of 2 and Paclitaxel 45 mg/m2. First dose expected on 01/24/2019.  INTERVAL HISTORY: Richard [name] 78 y.o. male returns to the clinic for a follow-up visit.  The patient's brother and daughter were available by phone during the visit today to assist with providing the history as the patient has dementia. The patient is feeling fair today without any concerning complaints except for continued shortness of breath with exertion and fatigue.  The patient was recently diagnosed with lung cancer after presenting to the emergency department in early July for the chief complaint of groin pain.  While hospitalized, the patient was found to have lung cancer with postobstructive pneumonia.  No clear etiology of the groin pain was found except for a bladder mass and the patient was referred to urology to further evaluate this lesion. The family denies any recent appointments with urology.  The patient denies any fevers, chills, or night sweats.  The patient's family notes some anorexia.  Patient is currently drinking Ensure daily.  The patient denies any chest pain, cough, or hemoptysis.  He denies any nausea, vomiting, diarrhea, or constipation.  He denies any headaches or visual changes.  Patient recently had a brain MRI and a PET scan performed to complete the staging work-up.  He is here today for evaluation and to review the scan results and treatment  options.  MEDICAL HISTORY: Past Medical History:  Diagnosis Date  . Dementia ([place])   . High cholesterol   . Hypertension   . Lung mass 12/2018    ALLERGIES:  has No Known Allergies.  MEDICATIONS:  Current Outpatient Medications  Medication Sig Dispense Refill  . aspirin (ASPIRIN 81) 81 MG EC tablet Take 1 tablet (81 mg total) by mouth daily. 90 tablet 3  . atorvastatin (LIPITOR) 20 MG tablet Take 1 tablet (20 mg total) by mouth every evening. 90 tablet 1  . HYDROcodone-acetaminophen (NORCO/VICODIN) 5-325 MG tablet Take 1 tablet by mouth every 4 (four) hours as needed for moderate pain. 12 tablet 0  . nicotine (NICODERM CQ - DOSED IN MG/24 HR) 7 mg/24hr patch Place 1 patch (7 mg total) onto the skin daily. (Patient not taking: Reported on 12/31/2018) 28 patch 0  . Omega-3 Fatty Acids (FISH OIL PO) Take 1 capsule by mouth daily.    . prochlorperazine (COMPAZINE) 10 MG tablet Take 1 tablet (10 mg total) by mouth every 6 (six) hours as needed for nausea or vomiting. 30 tablet 0   No current facility-administered medications for this visit.     SURGICAL HISTORY:  Past Surgical History:  Procedure Laterality Date  . JOINT REPLACEMENT    . VIDEO BRONCHOSCOPY WITH ENDOBRONCHIAL ULTRASOUND N/A 12/16/2018   Procedure: VIDEO BRONCHOSCOPY WITH ENDOBRONCHIAL ULTRASOUND AND FLUROSCOPY;  Surgeon: [name], MD;  Location: [facility];  Service: Pulmonary;  Laterality: N/A;    REVIEW OF SYSTEMS:   Review of Systems  Constitutional: Positive for decreased appetite and fatigue. Negative for appetite change, chills, fatigue, fever and unexpected  weight change.  HENT: Negative for mouth sores, nosebleeds, sore throat and trouble swallowing.   Eyes: Negative for eye problems and icterus.  Respiratory: Positive for shortness of breath. Negative for cough, hemoptysis, and wheezing.   Cardiovascular: Negative for chest pain and leg swelling.  Gastrointestinal: Negative for abdominal pain, constipation,  diarrhea, nausea and vomiting.  Genitourinary: Positive for left groin pain. Negative for bladder incontinence, difficulty urinating, dysuria, frequency and hematuria.   Musculoskeletal: Negative for back pain, gait problem, neck pain and neck stiffness.  Skin: Negative for itching and rash.  Neurological: Negative for dizziness, extremity weakness, gait problem, headaches, light-headedness and seizures.  Hematological: Negative for adenopathy. Does not bruise/bleed easily.  Psychiatric/Behavioral: Positive for dementia. Negative for depression and sleep disturbance. The patient is not nervous/anxious.     PHYSICAL EXAMINATION:  Blood pressure 114/66, pulse 96, temperature 98.7 F (37.1 C), temperature source Oral, resp. rate 18, height 6\' 3"  (1.905 m), weight 152 lb 4.8 oz (69.1 kg), SpO2 98 %.  ECOG PERFORMANCE STATUS: 2 - Symptomatic, <50% confined to bed  Physical Exam  Constitutional: Oriented to person, place, and time and thin-appearing male and in no distress.  HENT:  Head: Normocephalic and atraumatic.  Mouth/Throat: Oropharynx is clear and moist. No oropharyngeal exudate.  Eyes: Conjunctivae are normal. Right eye exhibits no discharge. Left eye exhibits no discharge. No scleral icterus.  Neck: Normal range of motion. Neck supple.  Cardiovascular: Normal rate, regular rhythm, normal heart sounds and intact distal pulses.   Pulmonary/Chest: Quiet breath sounds in all lung fields. Effort normal. No respiratory distress. No wheezes. No rales.  Abdominal: Soft. Bowel sounds are normal. Exhibits no distension and no mass. There is no tenderness.  Musculoskeletal: Normal range of motion. Exhibits no edema.  Lymphadenopathy:    No cervical adenopathy.  Neurological: Exhibits normal muscle tone.  Skin: Skin is warm and dry. No rash noted. Not diaphoretic. No erythema. No pallor.  Psychiatric: Patient has dementia and memory deficits.  Vitals reviewed.  LABORATORY DATA: Lab Results   Component Value Date   WBC 13.9 (H) 01/12/2019   HGB 12.8 (L) 01/12/2019   HCT 38.7 (L) 01/12/2019   MCV 61.8 (L) 01/12/2019   PLT 589 (H) 01/12/2019      Chemistry      Component Value Date/Time   NA 142 01/12/2019 1049   NA 145 (H) 10/29/2016 1513   K 4.2 01/12/2019 1049   CL 107 01/12/2019 1049   CO2 27 01/12/2019 1049   BUN 13 01/12/2019 1049   BUN 12 10/29/2016 1513   CREATININE 0.79 01/12/2019 1049      Component Value Date/Time   CALCIUM 10.4 (H) 01/12/2019 1049   ALKPHOS 92 01/12/2019 1049   AST 20 01/12/2019 1049   ALT 21 01/12/2019 1049   BILITOT 0.3 01/12/2019 1049       RADIOGRAPHIC STUDIES:  Dg Chest 1 View  Result Date: 12/16/2018 CLINICAL DATA:  Status post bronchoscopy EXAM: CHEST  1 VIEW COMPARISON:  12/14/2018 FINDINGS: Cardiac shadow is stable. No pneumothorax is noted following bronchoscopy. Persistent consolidation and central mass is seen in the right lung base similar to that seen on prior CT. The left lung remains clear. No bony abnormality is seen. IMPRESSION: No post bronchoscopy pneumothorax. Electronically Signed   By: Inez Catalina M.D.   On: 12/16/2018 17:16   Ct Chest W Contrast  Result Date: 12/14/2018 CLINICAL DATA:  Lung mass EXAM: CT CHEST WITH CONTRAST TECHNIQUE: Multidetector CT imaging of  the chest was performed during intravenous contrast administration. CONTRAST:  98mL OMNIPAQUE IOHEXOL 300 MG/ML  SOLN COMPARISON:  Abdominal CT earlier today FINDINGS: Cardiovascular: Heart is normal size. Aorta normal caliber. Coronary artery and aortic calcifications. Mediastinum/Nodes: Mediastinal and right hilar adenopathy. Low right paratracheal lymph node has a short axis diameter of 10 mm. Other similarly sized and smaller mediastinal lymph nodes. Right hilar adenopathy has short axis diameter of 13 mm. Lungs/Pleura: Large central right necrotic mass noted. There is postobstructive atelectasis or pneumonia in the right middle lobe as well as small  right pleural effusion. The central right lung mass is low-density centrally suggesting necrosis. It is difficult to accurately measure due to postobstructive airspace disease but measures approximately 5.3 x 4.3 cm on image 91. No confluent opacity or effusion on the left. Linear atelectasis or scarring at the left base. Moderate emphysema. Upper Abdomen: Imaging into the upper abdomen shows no acute findings. Musculoskeletal: No acute bony abnormality. IMPRESSION: Large central right necrotic mass measuring approximately 5.3 x 4.3 cm concerning for primary lung cancer. Postobstructive atelectasis or pneumonia in the right middle lobe. Small right pleural effusion. Right hilar and mediastinal adenopathy. Coronary artery disease. Aortic Atherosclerosis (ICD10-I70.0) and Emphysema (ICD10-J43.9). Electronically Signed   By: Rolm Baptise M.D.   On: 12/14/2018 23:40   Mr Jeri Cos LZ Contrast  Result Date: 01/04/2019 CLINICAL DATA:  Non-small cell lung cancer.  Staging. EXAM: MRI HEAD WITHOUT AND WITH CONTRAST TECHNIQUE: Multiplanar, multiecho pulse sequences of the brain and surrounding structures were obtained without and with intravenous contrast. CONTRAST:  6 cc Gadavist COMPARISON:  None. FINDINGS: Brain: Diffusion imaging does not show any acute or subacute infarction. No focal abnormality affects the brainstem or cerebellum. Cerebral hemispheres show advanced generalized atrophy. There is old cortical and subcortical infarction in the right occipital lobe and posterior parietal lobe. Mild chronic small-vessel ischemic change of the deep white matter. The ventricles are quite prominent, in proportion to the degree of generalized atrophy. Normal pressure hydrocephalus not excluded but not favored. No mass lesion, hemorrhage or extra-axial collection. After contrast administration, no abnormal enhancement occurs. Vascular: Major vessels at the base of the brain show flow. Skull and upper cervical spine: Negative  Sinuses/Orbits: Clear/normal Other: None IMPRESSION: No evidence metastatic disease. Advanced chronic generalized brain atrophy and ventriculomegaly. 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 Electronically Signed   By: Nelson Chimes M.D.   On: 01/04/2019 17:30   Ct Abdomen Pelvis W Contrast  Addendum Date: 12/14/2018   ADDENDUM REPORT: 12/14/2018 23:35 ADDENDUM: Additional images were added to the original study. Delayed sequences of the extreme inferior pelvis are submitted. Only the inferior bladder is included. There is dense excreted contrast in the bladder which could potentially obscure lesion. Partially visualized prostate appears enlarged. Electronically Signed   By: Donavan Foil M.D.   On: 12/14/2018 23:35   Result Date: 12/14/2018 CLINICAL DATA:  Groin pain EXAM: CT ABDOMEN AND PELVIS WITH CONTRAST TECHNIQUE: Multidetector CT imaging of the abdomen and pelvis was performed using the standard protocol following bolus administration of intravenous contrast. CONTRAST:  12mL OMNIPAQUE IOHEXOL 300 MG/ML  SOLN COMPARISON:  None FINDINGS: Lower chest: Small right-sided pleural effusion. Partial atelectasis or mild pneumonia in the right lung base. Incompletely visualized heterogenous masslike density in the right hilar to infrahilar lung with central low attenuation, possibly representing necrotic mass. This measures 4 cm transverse on coronal views, the cephalad extent is incompletely imaged. Postobstructive atelectasis or pneumonia in  the right middle lobe. Abrupt occlusion of the right middle lobe bronchus. Heart size is normal. Hepatobiliary: No focal liver abnormality is seen. No gallstones, gallbladder wall thickening, or biliary dilatation. Pancreas: Unremarkable. No pancreatic ductal dilatation or surrounding inflammatory changes. Spleen: Normal in size without focal abnormality. Adrenals/Urinary Tract: Adrenal glands are normal. Kidneys are within normal  limits. No hydronephrosis. Bladder largely obscured by artifact from left hip hardware. Bladder may be thick-walled. Suspicion of polypoid mass either arising from prostate or the posterior wall of the bladder. Stomach/Bowel: The stomach is nonenlarged. No dilated small bowel. No bowel wall thickening. Negative appendix. Vascular/Lymphatic: Extensive aortic atherosclerosis without aneurysm. Heavily calcified distal common iliac, external iliac and internal iliac arteries with possible segmental occlusion of left proximal external iliac artery. No significantly enlarged lymph nodes. Reproductive: Prostate is likely enlarged. Possible polypoid projection off the anterior prostate. Other: Negative for free air or free fluid Musculoskeletal: Left hip replacement with artifact. Diffuse degenerative changes of the spine. No acute osseous abnormality. IMPRESSION: 1. Small right-sided pleural effusion. Atelectasis/consolidation of the right middle lobe with incompletely visualized heterogenous masslike density in the right hilar/infrahilar region with central low attenuation. Primary concern is for necrotic lung mass/carcinoma with postobstructive atelectasis or pneumonia of the right middle lobe. Dedicated chest CT, preferably with contrast, when clinically feasible is recommended. 2. Small right-sided pleural effusion with probable atelectasis at the right posterior lung base. 3. Suspicion of polypoid mass either arising from posterior wall of the bladder versus the prostate gland. Further characterisation limited due to artifact from left hip hardware. 4. 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 Electronically Signed: By: Donavan Foil M.D. On: 12/14/2018 22:04   Nm Pet Image Initial (pi) Skull Base To Thigh  Result Date: 01/04/2019 CLINICAL DATA:  Initial treatment strategy for right lung mass. EXAM: NUCLEAR MEDICINE  PET SKULL BASE TO THIGH TECHNIQUE: 8.2 mCi F-18 FDG was injected intravenously. Full-ring PET imaging was performed from the skull base to thigh after the radiotracer. CT data was obtained and used for attenuation correction and anatomic localization. Fasting blood glucose: 97 mg/dl COMPARISON:  Chest CT from 12/14/2018 FINDINGS: Mediastinal blood pool activity: SUV max 2.6 Liver activity: SUV max NA NECK: Unusually prominent but bilaterally symmetric activity in the salpingopharyngeus muscles bilaterally, probably incidental. Incidental CT findings: Prominent ventricular system, correlate with MRI brain. CHEST: The right hilar mass predominantly seems to have right upper lobe and perhaps right middle lobe components, has maximum SUV of 28.9. Associated hypermetabolic activity measures 5.3 by 4.9 cm. There is combination of volume loss and drowned lung appearance in the right middle lobe with generalized accentuated metabolic activity maximum SUV of about 7.7. A small right pleural effusion has low-grade metabolic activity, representative internal SUV 2.4. Right lower paratracheal lymph node at the level of the carina 1.2 cm in short axis, maximum SUV 4.9. A subcarinal node measuring 0.6 cm in short axis on image 82/4 has maximum SUV of 4.5. Incidental CT findings: Centrilobular emphysema. 5 mm left lower lobe nodule in the posterior basal segment is not perceptibly hypermetabolic but is below sensitive PET-CT size thresholds. ABDOMEN/PELVIS: In upper portion of the right hepatic lobe, a small focus of accentuated metabolic activity is present, maximum SUV of 4.9, background liver activity has a maximum SUV of about 4.0. Incidental CT findings: Aortoiliac atherosclerotic vascular disease. SKELETON: In the vicinity of the inferior tip of the right scapula there is  a small focus of accentuated metabolic activity, maximum SUV 5.0. I do not see an obvious underlying bony or soft tissue abnormality in the scapular tip or  adjacent musculature. It could be that this is simply physiologic adjacent muscular activity but attention to this region is recommended. There is activity in the right antecubital fossa and adjacent vasculature likely related to small extravasation of radiopharmaceutical during injection. Incidental CT findings: Left total hip prosthesis. IMPRESSION: 1. Approximately 5.3 cm right hilar mass with upper lobe and likely middle lobe involvement, maximum SUV 28.9, compatible with malignancy. There is a mildly hypermetabolic right lower paratracheal lymph node and mild hypermetabolic activity in a small subcarinal lymph node. 2. Small right pleural effusion with low-grade metabolic activity, representative internal SUV 2.4. This is indeterminate for malignancy. 3. The 5 mm left lower lobe nodule in the posterior basal segment is not perceptibly hypermetabolic but is below sensitive PET-CT size thresholds. 4. In the upper portion of the right hepatic lobe there is a small focus of metabolic activity, maximum SUV of 4.9 compared to background liver activity maximum SUV of 4.0. The appearance raises suspicion for a small metastatic lesion. This could be confirmed by hepatic protocol MRI with and without contrast, if clinically warranted. 5. Small focus of activity near the inferior tip of the right scapula. No corresponding bony or soft tissue lesion is perceived on the CT data. This may simply be physiologic activity adjacent to the scapular tip but the area merits surveillance on follow up chest CT examinations. 6. Combination of volume loss and drowned lung appearance of the right middle lobe which has diffuse moderate activity which is likely inflammatory. 7. Other imaging findings of potential clinical significance: Aortic Atherosclerosis (ICD10-I70.0) and Emphysema (ICD10-J43.9). Left total hip prosthesis. Electronically Signed   By: Van Clines M.D.   On: 01/04/2019 16:43   US Scrotum W/doppler  Result  Date: 12/14/2018 CLINICAL DATA:  78 year old male with testicular pain. EXAM: SCROTAL ULTRASOUND DOPPLER ULTRASOUND OF THE TESTICLES TECHNIQUE: Complete ultrasound examination of the testicles, epididymis, and other scrotal structures was performed. Color and spectral Doppler ultrasound were also utilized to evaluate blood flow to the testicles. COMPARISON:  CT Abdomen and Pelvis today are reported separately. FINDINGS: Right testicle Measurements: 3.4 x 1.8 x 1.8 centimeters. Heterogeneous echotexture (image 2) but no discrete testicular mass. Left testicle Measurements: 3.7 x 1.7 x 1.9 centimeters. Heterogeneous echotexture (image 26) but no discrete testicular mass. Right epididymis:  Normal in size and appearance. Left epididymis:  Could not be identified. Hydrocele:  None visualized. Varicocele:  None visualized. Pulsed Doppler interrogation of both testes demonstrates normal low resistance arterial and venous waveforms bilaterally. IMPRESSION: 1. No evidence of testicular torsion. 2. Symmetric heterogeneity of both testes with no discrete testicular mass. Electronically Signed   By: Genevie Ann M.D.   On: 12/14/2018 21:52   Dg C-arm Bronchoscopy  Result Date: 12/16/2018 C-ARM BRONCHOSCOPY: Fluoroscopy was utilized by the requesting physician.  No radiographic interpretation.     ASSESSMENT/PLAN:  This is a very pleasant 78 year old African-American male with dementia.  He was diagnosed with at least stage IIIa (T3, M0, and 0) non-small cell lung cancer, squamous cell carcinoma. He presented with a large right middle lobe lung mass with postobstructive pneumonia as well as right hilar mediastinal lymphadenopathy.  He was diagnosed in July 2020. This could also be stage IV if the suspicious small right pleural effusion is malignant.   The patient recently completed the staging work-up.  He  recently had a brain MRI and PET scan performed.  Dr. Julien Nordmann personally independently reviewed the scan and discussed  results with the patient today and his daughter and brother, who was available by phone. The brain MRI was negative for any metastatic disease.  The patient's PET scan showed the known 5.3 cm right hilar mass. The PET scan also showed suspicious but indeterminate small right pleural effusion and a small right hepatic lobe focus of metabolic activity.   Dr. Julien Nordmann discussed the patient's current condition and treatment options. Dr. Julien Nordmann recommends that the patient proceed with a course of concurrent chemoradiation with weekly carboplatin for an AUC of 2 and paclitaxel 45 mg/m2 The patient is interested in this option. He is expected to start his first treatment on August 10th, 2020.   We discussed with the patient the adverse effects of this treatment including but not limited to alopecia, myelosuppression, nausea and vomiting, peripheral neuropathy, liver or renal dysfunction.  I will arrange for the patient to have a chemotherapy education class before his first treatment.   A referral was placed to radiation oncology for evaluation and recommendation regarding the radiotherapy.   I have sent a prescription for Compazine 10 mg p.o. every 6 hours as needed for nausea.  We will see him back for follow-up visit in 3 weeks for evaluation starting from cycle #2.   We will continue to monitor the pleural effusion and suspicious small liver focus on subsequent imaging.   The patient was strongly encouraged to quit smoking.  The patient was evaluated in the hospital for his groin pain. At that time, a referral was made to [facility] urology to further evaluate a bladder mass that was found. We will reach out to there office to follow up on that referral.   The patient's daughter is concerned about his weight loss and has requested further nutritional evaluation. I have sent a referral to our nutritionist for further evaluation and recommendations regarding nutritional status.  The patient was  advised to call immediately if he has any concerning symptoms in the interval. The patient voices understanding of current disease status and treatment options and is in agreement with the current care plan. All questions were answered. The patient knows to call the clinic with any problems, questions or concerns. We can certainly see the patient much sooner if necessary  Orders Placed This Encounter  Procedures  . CMP ([facility] only)    Standing Status:   Standing    Number of Occurrences:   7    Standing Expiration Date:   01/12/2020  . CBC with Differential (Cancer Center Only)    Standing Status:   Standing    Number of Occurrences:   7    Standing Expiration Date:   01/12/2020  . Ambulatory referral to Nutrition and Diabetic E    Referral Priority:   Routine    Referral Type:   Consultation    Referral Reason:   Specialty Services Required    Number of Visits Requested:   [place], PA-C 01/12/19  ADDENDUM: Hematology/Oncology Attending: I had a face-to-face encounter with the patient today.  I recommended his care plan.  This is a very pleasant 78 years old African-American male who was recently diagnosed with a stage IIIa non-small cell lung cancer, squamous cell carcinoma presented with right lower lobe lung mass in addition to mediastinal lymphadenopathy.  The recent PET scan also showed suspicious lesion in the liver  but not completely confirmed to be malignant.  The patient also had MRI of the brain that showed no evidence of metastatic disease to the brain. I had a lengthy discussion with the patient and his brother Mallie Mussel about his current disease stage, prognosis and treatment options.  I recommended for the patient treatment with a course of concurrent chemoradiation with weekly carboplatin for AUC of 2 and paclitaxel 45 mg/M2 for around 6-7 weeks.  This will be followed by consolidation immunotherapy if the patient has no evidence for disease  progression after the induction phase. I discussed with the patient the adverse effect of this treatment including but not limited to alopecia, myelosuppression, nausea and vomiting, peripheral neuropathy, liver or renal dysfunction. He is expected to start the first cycle of this treatment on January 24, 2019. We will refer the patient to radiation oncology for evaluation and discussion of the radiotherapy option. We will call his pharmacy with Compazine 10 mg p.o. every 6 hours as needed for nausea. We will arrange for the patient to have a chemotherapy education class before the first dose of his treatment. He was advised to call immediately if he has any concerning symptoms in the interval. We will see the patient every 2 weeks during his treatment.  Disclaimer: This note was dictated with voice recognition software. Similar sounding words can inadvertently be transcribed and may be missed upon review. Eilleen Kempf, MD 01/12/19

## 2019-01-13 ENCOUNTER — Telehealth: Payer: Self-pay | Admitting: *Deleted

## 2019-01-13 NOTE — Telephone Encounter (Signed)
Called and left voice mail with patient's daughter for a call back to reschedule  consultation with Dr. Sondra Come.

## 2019-01-14 ENCOUNTER — Telehealth: Payer: Self-pay | Admitting: Internal Medicine

## 2019-01-14 NOTE — Telephone Encounter (Signed)
Scheduled appt per 7/29 LOS - pt daughter aware of first set of appts.

## 2019-01-17 ENCOUNTER — Telehealth: Payer: Self-pay | Admitting: Internal Medicine

## 2019-01-17 DIAGNOSIS — I10 Essential (primary) hypertension: Secondary | ICD-10-CM | POA: Diagnosis not present

## 2019-01-17 DIAGNOSIS — E785 Hyperlipidemia, unspecified: Secondary | ICD-10-CM | POA: Diagnosis not present

## 2019-01-17 DIAGNOSIS — Z7982 Long term (current) use of aspirin: Secondary | ICD-10-CM | POA: Diagnosis not present

## 2019-01-17 DIAGNOSIS — C342 Malignant neoplasm of middle lobe, bronchus or lung: Secondary | ICD-10-CM | POA: Diagnosis not present

## 2019-01-17 DIAGNOSIS — J188 Other pneumonia, unspecified organism: Secondary | ICD-10-CM | POA: Diagnosis not present

## 2019-01-17 DIAGNOSIS — R69 Illness, unspecified: Secondary | ICD-10-CM | POA: Diagnosis not present

## 2019-01-17 DIAGNOSIS — Z79891 Long term (current) use of opiate analgesic: Secondary | ICD-10-CM | POA: Diagnosis not present

## 2019-01-17 NOTE — Telephone Encounter (Signed)
Called pt daughter to follow up with questions about transportation. Pt unfortunately does not qualify for program - let pt daughter know and she was very upset. Sent email to let Social work, MD and Air traffic controller know to see if anything can be done to help .

## 2019-01-18 ENCOUNTER — Telehealth: Payer: Self-pay | Admitting: *Deleted

## 2019-01-18 ENCOUNTER — Inpatient Hospital Stay: Payer: Medicare HMO | Admitting: Nutrition

## 2019-01-18 ENCOUNTER — Encounter: Payer: Self-pay | Admitting: *Deleted

## 2019-01-18 ENCOUNTER — Inpatient Hospital Stay: Payer: Medicare HMO | Attending: Internal Medicine

## 2019-01-18 DIAGNOSIS — Z5111 Encounter for antineoplastic chemotherapy: Secondary | ICD-10-CM | POA: Insufficient documentation

## 2019-01-18 DIAGNOSIS — C342 Malignant neoplasm of middle lobe, bronchus or lung: Secondary | ICD-10-CM | POA: Diagnosis not present

## 2019-01-18 DIAGNOSIS — Z7982 Long term (current) use of aspirin: Secondary | ICD-10-CM | POA: Diagnosis not present

## 2019-01-18 DIAGNOSIS — Z79891 Long term (current) use of opiate analgesic: Secondary | ICD-10-CM | POA: Diagnosis not present

## 2019-01-18 DIAGNOSIS — Z79899 Other long term (current) drug therapy: Secondary | ICD-10-CM | POA: Insufficient documentation

## 2019-01-18 DIAGNOSIS — R69 Illness, unspecified: Secondary | ICD-10-CM | POA: Diagnosis not present

## 2019-01-18 DIAGNOSIS — E785 Hyperlipidemia, unspecified: Secondary | ICD-10-CM | POA: Diagnosis not present

## 2019-01-18 DIAGNOSIS — I10 Essential (primary) hypertension: Secondary | ICD-10-CM | POA: Diagnosis not present

## 2019-01-18 DIAGNOSIS — J188 Other pneumonia, unspecified organism: Secondary | ICD-10-CM | POA: Diagnosis not present

## 2019-01-18 NOTE — Telephone Encounter (Signed)
[place] Work  Clinical Social Work was referred by medical oncology for assessment of psychosocial needs.  Clinical Social Worker contacted patient's daughter to offer support and assess for needs.  Patient has dementia, affairs handled through daughter/primary caregiver.  Patient's daughter most concerned with [employer] transportation- daughter plans to drop off application at [facility], [place] will complete and fax to [facility] office. Patient's daughter shared Mr. Bisono was "doing well" until recent passing of his twin brother prior to being diagnosed with cancer.  She does not believe he fully understands cancer diagnosis.  She discussed the grief and feeling overwhelmed with all they have faced this year. CSW provided affirmations and brief emotional support.  CSW discussed possible resources and patient's daughter was interested in any support.  CSW will follow up next week with patient/daughter.   Gwinda Maine, LCSW  Clinical Social Worker [facility]

## 2019-01-18 NOTE — Progress Notes (Signed)
Oncology Nurse Navigator Documentation  Oncology Nurse Navigator Flowsheets 01/18/2019  Diagnosis Status Confirmed Diagnosis Complete  Navigator Follow Up Date: 01/20/2019  Navigator Follow Up Reason: Review Note  Navigator Location [facility]  Referral Date to RadOnc/MedOnc -  Navigator Encounter Type Other/I received notification patient has been set up for transportation and needs to be re-scheduled with Dr. Sondra Come.  I updated Rad Onc to re-scheduled patient.   Abnormal Finding Date 12/14/2018  Confirmed Diagnosis Date 12/16/2018  Patient Visit Type Other  Treatment Phase Pre-Tx/Tx Discussion  Barriers/Navigation Needs Coordination of Care;Education  Education Other  Interventions Coordination of Care;Education  Coordination of Care Other  Acuity Level 2  Time Spent with Patient 30

## 2019-01-18 NOTE — Telephone Encounter (Signed)
Called patient's daughter to schedule appointment with Dr. Sondra Come  For 01/19/19. I was told that it didn't suit and to call back tomorrow.

## 2019-01-18 NOTE — Progress Notes (Signed)
Nutrition consult with patient's daughter [name] over the telephone.  Patient is a 78 year old male diagnosed with non-small cell lung cancer. He is a patient of Dr. Julien Nordmann receiving concurrent chemoradiation therapy.  Past medical history includes dementia, hypercholesterolemia, and hypertension.  Medications include Lipitor, omega-3 fatty acids, and Compazine.  Labs include glucose 123 and albumin 2.4 on July 29.  Height: 6 feet 3 inches. Weight: 152.3 pounds on July 29. Usual body weight: 174 pounds October 2019. BMI: 19.04.  Daughter reports patient is depressed after the death of his twin. He is not eating much on his own but will eat small amounts if his family puts it in front of him. She reports he has consumed oral nutrition supplements in the past and would likely drink Ensure if provided for him. Daughter reports financial difficulty affording oral nutrition supplements. Patient has lost 13% body weight since October 2019 weight has been stable the past 3 or 4 weeks.  Nutrition diagnosis: Unintended weight loss related to non-small cell lung cancer associated treatments as evidenced by 13% weight loss over 10 months.  Intervention: Educated to increase calories and protein and small frequent meals and snacks. Reviewed strategies for increasing calories and protein. Recommended patient consume oral nutrition supplements. Provided 1 complementary case of Ensure Enlive. Consider appetite stimulant if appropriate.  Monitoring, evaluation, goals: Patient will tolerate increased calories and protein to minimize further weight loss.  Next visit: Monday, August 17 during infusion.  **Disclaimer: This note was dictated with voice recognition software. Similar sounding words can inadvertently be transcribed and this note may contain transcription errors which may not have been corrected upon publication of note.**

## 2019-01-19 ENCOUNTER — Inpatient Hospital Stay: Payer: Medicare HMO | Admitting: Nutrition

## 2019-01-19 ENCOUNTER — Telehealth: Payer: Self-pay | Admitting: *Deleted

## 2019-01-19 NOTE — Telephone Encounter (Signed)
[place] Work  Patient's daughter dropped off SCAT form at front desk for [place].  From review, form is already extensively filled out by PT provider.  CSW faxed form and additional documentation stating patient would be receiving cancer treatment at [facility] to [place] office.    Gwinda Maine, LCSW  Clinical Social Worker [facility]

## 2019-01-20 ENCOUNTER — Encounter: Payer: Self-pay | Admitting: General Practice

## 2019-01-20 ENCOUNTER — Telehealth: Payer: Self-pay | Admitting: *Deleted

## 2019-01-20 NOTE — Telephone Encounter (Signed)
Called & spoke to daughter & asked is she had an questions/concerns regarding side effects of treatment.  She states she does not.  Informed to call if anything comes up & she expressed understanding.

## 2019-01-20 NOTE — Progress Notes (Signed)
[place] CSW Progress Notes  Call from [place], [place], has received application and will add patient to their list so he can access SCAT services.  Patient advised by phone.  Edwyna Shell, LCSW Clinical Social Worker Phone:  [phone]

## 2019-01-21 NOTE — Progress Notes (Signed)
Thoracic Location of Tumor / Histology: Right Middle Lobe SCC  Patient presented with symptoms of: presented with large right middle lobe lung mass with postobstructive pneumonia as well as right hilar and mediastinal lymphadenopathy diagnosed in July 2020. He also has a suspicious small right pleural effusion and small indeterminate metabolic focus in the liver.   Biopsies: 12/16/18: Diagnosis FINE NEEDLE ASPIRATION, EBUS, RML BRUSHINGS, A (SPECIMEN 1 OF 3, COLLECTED 12/16/18): MALIGNANT CELLS CONSISTENT WITH SQUAMOUS CELL CARCINOMA.  12/16/18: Diagnosis Lung, biopsy, Right Middle Lobe - SQUAMOUS CELL CARCINOMA, SEE COMMENT.   Tobacco/Marijuana/Snuff/ETOH use: Smoking status: Current Every Day Smoker    Packs/day: 1.00   Years: 60.00   Pack years: 60.00   Types: Cigarettes    Past/Anticipated interventions by cardiothoracic surgery, if any: None at this time.  Past/Anticipated interventions by medical oncology, if any: Per Dr. Julien Nordmann 01/12/19: ASSESSMENT/PLAN:  This is a very pleasant 78 year old African-American male with dementia.  He was diagnosed with at least stage IIIa (T3, M0, and 0) non-small cell lung cancer, squamous cell carcinoma. He presented with a large right middle lobe lung mass with postobstructive pneumonia as well as right hilar mediastinal lymphadenopathy.  He was diagnosed in July 2020. This could also be stage IV if the suspicious small right pleural effusion is malignant.   The patient recently completed the staging work-up.  He recently had a brain MRI and PET scan performed.  Dr. Julien Nordmann personally independently reviewed the scan and discussed results with the patient today and his daughter and brother, who was available by phone. The brain MRI was negative for any metastatic disease.  The patient's PET scan showed the known 5.3 cm right hilar mass. The PET scan also showed suspicious but indeterminate small right pleural effusion and a small right hepatic lobe  focus of metabolic activity.   Dr. Julien Nordmann discussed the patient's current condition and treatment options. Dr. Julien Nordmann recommends that the patient proceed with a course of concurrent chemoradiation with weekly carboplatin for an AUC of 2 and paclitaxel 45 mg/m2 The patient is interested in this option. He is expected to start his first treatment on August 10th, 2020.   We discussed with the patient the adverse effects of this treatment including but not limited to alopecia, myelosuppression, nausea and vomiting, peripheral neuropathy, liver or renal dysfunction.  I will arrange for the patient to have a chemotherapy education class before his first treatment.   A referral was placed to radiation oncology for evaluation and recommendation regarding the radiotherapy.   I have sent a prescription for Compazine 10 mg p.o. every 6 hours as needed for nausea.  We will see him back for follow-up visit in 3 weeks for evaluation starting from cycle #2.   We will continue to monitor the pleural effusion and suspicious small liver focus on subsequent imaging.   The patient was strongly encouraged to quit smoking.  The patient was evaluated in the hospital for his groin pain. At that time, a referral was made to [facility] urology to further evaluate a bladder mass that was found. We will reach out to there office to follow up on that referral.   The patient's daughter is concerned about his weight loss and has requested further nutritional evaluation. I have sent a referral to our nutritionist for further evaluation and recommendations regarding nutritional status.  The patient was advised to call immediately if he has any concerning symptoms in the interval. The patient voices understanding of current disease status and treatment  options and is in agreement with the current care plan. All questions were answered. The patient knows to call the clinic with any problems, questions or concerns. We  can certainly see the patient much sooner if necessary   Signs/Symptoms Weight changes, if any:  Wt Readings from Last 3 Encounters:  01/12/19 152 lb 4.8 oz (69.1 kg)  12/31/18 152 lb (68.9 kg)  12/29/18 152 lb 6.4 oz (69.1 kg)      The patient denies any fevers, chills, or night sweats.  The patient's family notes some anorexia.  Patient is currently drinking Ensure daily.  The patient denies any chest pain, cough, or hemoptysis.  He denies any nausea, vomiting, diarrhea, or constipation.  He denies any headaches or visual changes.  Pain issues, if any:  Pt with dementia. Pt is unable to report pain or quantify pain. Pt   SAFETY ISSUES:  Prior radiation? no  Pacemaker/ICD? no   Possible current pregnancy?male Is the patient on methotrexate?no  Current Complaints / other details:  Pt presents today for initial consult with Dr. Sondra Come for Radiation Oncology. Pt's daughter on speaker phone and pt was calm during daughter speaking.   BP (!) 130/97 (BP Location: Left Arm)   Pulse (!) 102   Temp 98 F (36.7 C) (Tympanic)   Resp 20   SpO2 97%   Loma Sousa, RN BSN

## 2019-01-24 ENCOUNTER — Inpatient Hospital Stay: Payer: Medicare HMO

## 2019-01-24 ENCOUNTER — Other Ambulatory Visit: Payer: Self-pay

## 2019-01-24 ENCOUNTER — Encounter: Payer: Self-pay | Admitting: Hematology and Oncology

## 2019-01-24 VITALS — BP 121/71 | HR 74 | Temp 97.6°F | Resp 16

## 2019-01-24 DIAGNOSIS — C3491 Malignant neoplasm of unspecified part of right bronchus or lung: Secondary | ICD-10-CM

## 2019-01-24 DIAGNOSIS — Z79899 Other long term (current) drug therapy: Secondary | ICD-10-CM | POA: Diagnosis not present

## 2019-01-24 DIAGNOSIS — C342 Malignant neoplasm of middle lobe, bronchus or lung: Secondary | ICD-10-CM | POA: Diagnosis not present

## 2019-01-24 DIAGNOSIS — Z5111 Encounter for antineoplastic chemotherapy: Secondary | ICD-10-CM | POA: Diagnosis not present

## 2019-01-24 LAB — CBC WITH DIFFERENTIAL (CANCER CENTER ONLY)
Abs Immature Granulocytes: 0.07 10*3/uL (ref 0.00–0.07)
Basophils Absolute: 0.1 10*3/uL (ref 0.0–0.1)
Basophils Relative: 0 %
Eosinophils Absolute: 0 10*3/uL (ref 0.0–0.5)
Eosinophils Relative: 0 %
HCT: 40.6 % (ref 39.0–52.0)
Hemoglobin: 13 g/dL (ref 13.0–17.0)
Immature Granulocytes: 0 %
Lymphocytes Relative: 22 %
Lymphs Abs: 3.8 10*3/uL (ref 0.7–4.0)
MCH: 19.9 pg — ABNORMAL LOW (ref 26.0–34.0)
MCHC: 32 g/dL (ref 30.0–36.0)
MCV: 62.3 fL — ABNORMAL LOW (ref 80.0–100.0)
Monocytes Absolute: 1.2 10*3/uL — ABNORMAL HIGH (ref 0.1–1.0)
Monocytes Relative: 7 %
Neutro Abs: 12.1 10*3/uL — ABNORMAL HIGH (ref 1.7–7.7)
Neutrophils Relative %: 71 %
Platelet Count: 595 10*3/uL — ABNORMAL HIGH (ref 150–400)
RBC: 6.52 MIL/uL — ABNORMAL HIGH (ref 4.22–5.81)
RDW: 17.3 % — ABNORMAL HIGH (ref 11.5–15.5)
WBC Count: 17.3 10*3/uL — ABNORMAL HIGH (ref 4.0–10.5)
nRBC: 0 % (ref 0.0–0.2)

## 2019-01-24 LAB — CMP (CANCER CENTER ONLY)
ALT: 23 U/L (ref 0–44)
AST: 26 U/L (ref 15–41)
Albumin: 2.5 g/dL — ABNORMAL LOW (ref 3.5–5.0)
Alkaline Phosphatase: 105 U/L (ref 38–126)
Anion gap: 14 (ref 5–15)
BUN: 18 mg/dL (ref 8–23)
CO2: 22 mmol/L (ref 22–32)
Calcium: 10.9 mg/dL — ABNORMAL HIGH (ref 8.9–10.3)
Chloride: 107 mmol/L (ref 98–111)
Creatinine: 0.92 mg/dL (ref 0.61–1.24)
GFR, Est AFR Am: >60 mL/min (ref >60)
GFR, Estimated: >60 mL/min (ref >60)
Glucose, Bld: 159 mg/dL — ABNORMAL HIGH (ref 70–99)
Potassium: 3.9 mmol/L (ref 3.5–5.1)
Sodium: 143 mmol/L (ref 135–145)
Total Bilirubin: 0.4 mg/dL (ref 0.3–1.2)
Total Protein: 8.7 g/dL — ABNORMAL HIGH (ref 6.5–8.1)

## 2019-01-24 MED ORDER — DIPHENHYDRAMINE HCL 50 MG/ML IJ SOLN
50.0000 mg | Freq: Once | INTRAMUSCULAR | Status: AC
  Administered 2019-01-24: 50 mg via INTRAVENOUS

## 2019-01-24 MED ORDER — PALONOSETRON HCL INJECTION 0.25 MG/5ML
INTRAVENOUS | Status: AC
  Filled 2019-01-24: qty 5

## 2019-01-24 MED ORDER — SODIUM CHLORIDE 0.9 % IV SOLN
20.0000 mg | Freq: Once | INTRAVENOUS | Status: AC
  Administered 2019-01-24: 20 mg via INTRAVENOUS
  Filled 2019-01-24: qty 2

## 2019-01-24 MED ORDER — DIPHENHYDRAMINE HCL 50 MG/ML IJ SOLN
INTRAMUSCULAR | Status: AC
  Filled 2019-01-24: qty 1

## 2019-01-24 MED ORDER — SODIUM CHLORIDE 0.9 % IV SOLN
Freq: Once | INTRAVENOUS | Status: AC
  Administered 2019-01-24: 10:00:00 via INTRAVENOUS
  Filled 2019-01-24: qty 250

## 2019-01-24 MED ORDER — PALONOSETRON HCL INJECTION 0.25 MG/5ML
0.2500 mg | Freq: Once | INTRAVENOUS | Status: AC
  Administered 2019-01-24: 0.25 mg via INTRAVENOUS

## 2019-01-24 MED ORDER — SODIUM CHLORIDE 0.9 % IV SOLN
45.0000 mg/m2 | Freq: Once | INTRAVENOUS | Status: AC
  Administered 2019-01-24: 84 mg via INTRAVENOUS
  Filled 2019-01-24: qty 14

## 2019-01-24 MED ORDER — FAMOTIDINE IN NACL 20-0.9 MG/50ML-% IV SOLN
20.0000 mg | Freq: Once | INTRAVENOUS | Status: AC
  Administered 2019-01-24: 11:00:00 20 mg via INTRAVENOUS

## 2019-01-24 MED ORDER — FAMOTIDINE IN NACL 20-0.9 MG/50ML-% IV SOLN
INTRAVENOUS | Status: AC
  Filled 2019-01-24: qty 50

## 2019-01-24 MED ORDER — SODIUM CHLORIDE 0.9 % IV SOLN
172.8000 mg | Freq: Once | INTRAVENOUS | Status: AC
  Administered 2019-01-24: 170 mg via INTRAVENOUS
  Filled 2019-01-24: qty 17

## 2019-01-24 NOTE — Patient Instructions (Addendum)
[facility] Discharge Instructions for Patients Receiving Chemotherapy  Today you received the following chemotherapy agents :  Taxol, Carboplatin.  To help prevent nausea and vomiting after your treatment, we encourage you to take your nausea medication as prescribed.  TAKE COMPAZINE 10 MG BY MOUTH EVERY 6 HOURS AS NEEDED FOR NAUSEA.   If you develop nausea and vomiting that is not controlled by your nausea medication, call the clinic.   BELOW ARE SYMPTOMS THAT SHOULD BE REPORTED IMMEDIATELY:  *FEVER GREATER THAN 100.5 F  *CHILLS WITH OR WITHOUT FEVER  NAUSEA AND VOMITING THAT IS NOT CONTROLLED WITH YOUR NAUSEA MEDICATION  *UNUSUAL SHORTNESS OF BREATH  *UNUSUAL BRUISING OR BLEEDING  TENDERNESS IN MOUTH AND THROAT WITH OR WITHOUT PRESENCE OF ULCERS  *URINARY PROBLEMS  *BOWEL PROBLEMS  UNUSUAL RASH Items with * indicate a potential emergency and should be followed up as soon as possible.  Feel free to call the clinic should you have any questions or concerns. The clinic phone number is [phone].  Please show the [facility] at check-in to the Emergency Department and triage nurse.   Carboplatin injection What is this medicine? CARBOPLATIN (KAR boe pla tin) is a chemotherapy drug. It targets fast dividing cells, like cancer cells, and causes these cells to die. This medicine is used to treat ovarian cancer and many other cancers. This medicine may be used for other purposes; ask your health care provider or pharmacist if you have questions. COMMON BRAND NAME(S): Paraplatin What should I tell my health care provider before I take this medicine? They need to know if you have any of these conditions:  blood disorders  hearing problems  kidney disease  recent or ongoing radiation therapy  an unusual or allergic reaction to carboplatin, cisplatin, other chemotherapy, other medicines, foods, dyes, or preservatives  pregnant or trying to get  pregnant  breast-feeding How should I use this medicine? This drug is usually given as an infusion into a vein. It is administered in a hospital or clinic by a specially trained health care professional. Talk to your pediatrician regarding the use of this medicine in children. Special care may be needed. Overdosage: If you think you have taken too much of this medicine contact a poison control center or emergency room at once. NOTE: This medicine is only for you. Do not share this medicine with others. What if I miss a dose? It is important not to miss a dose. Call your doctor or health care professional if you are unable to keep an appointment. What may interact with this medicine?  medicines for seizures  medicines to increase blood counts like filgrastim, pegfilgrastim, sargramostim  some antibiotics like amikacin, gentamicin, neomycin, streptomycin, tobramycin  vaccines Talk to your doctor or health care professional before taking any of these medicines:  acetaminophen  aspirin  ibuprofen  ketoprofen  naproxen This list may not describe all possible interactions. Give your health care provider a list of all the medicines, herbs, non-prescription drugs, or dietary supplements you use. Also tell them if you smoke, drink alcohol, or use illegal drugs. Some items may interact with your medicine. What should I watch for while using this medicine? Your condition will be monitored carefully while you are receiving this medicine. You will need important blood work done while you are taking this medicine. This drug may make you feel generally unwell. This is not uncommon, as chemotherapy can affect healthy cells as well as cancer cells. Report any side  effects. Continue your course of treatment even though you feel ill unless your doctor tells you to stop. In some cases, you may be given additional medicines to help with side effects. Follow all directions for their use. Call your  doctor or health care professional for advice if you get a fever, chills or sore throat, or other symptoms of a cold or flu. Do not treat yourself. This drug decreases your body's ability to fight infections. Try to avoid being around people who are sick. This medicine may increase your risk to bruise or bleed. Call your doctor or health care professional if you notice any unusual bleeding. Be careful brushing and flossing your teeth or using a toothpick because you may get an infection or bleed more easily. If you have any dental work done, tell your dentist you are receiving this medicine. Avoid taking products that contain aspirin, acetaminophen, ibuprofen, naproxen, or ketoprofen unless instructed by your doctor. These medicines may hide a fever. Do not become pregnant while taking this medicine. Women should inform their doctor if they wish to become pregnant or think they might be pregnant. There is a potential for serious side effects to an unborn child. Talk to your health care professional or pharmacist for more information. Do not breast-feed an infant while taking this medicine. What side effects may I notice from receiving this medicine? Side effects that you should report to your doctor or health care professional as soon as possible:  allergic reactions like skin rash, itching or hives, swelling of the face, lips, or tongue  signs of infection - fever or chills, cough, sore throat, pain or difficulty passing urine  signs of decreased platelets or bleeding - bruising, pinpoint red spots on the skin, black, tarry stools, nosebleeds  signs of decreased red blood cells - unusually weak or tired, fainting spells, lightheadedness  breathing problems  changes in hearing  changes in vision  chest pain  high blood pressure  low blood counts - This drug may decrease the number of white blood cells, red blood cells and platelets. You may be at increased risk for infections and  bleeding.  nausea and vomiting  pain, swelling, redness or irritation at the injection site  pain, tingling, numbness in the hands or feet  problems with balance, talking, walking  trouble passing urine or change in the amount of urine Side effects that usually do not require medical attention (report to your doctor or health care professional if they continue or are bothersome):  hair loss  loss of appetite  metallic taste in the mouth or changes in taste This list may not describe all possible side effects. Call your doctor for medical advice about side effects. You may report side effects to FDA at 1-800-FDA-1088. Where should I keep my medicine? This drug is given in a hospital or clinic and will not be stored at home. NOTE: This sheet is a summary. It may not cover all possible information. If you have questions about this medicine, talk to your doctor, pharmacist, or health care provider.  2020 Elsevier/Gold Standard (2007-09-07 14:38:05)  Paclitaxel injection What is this medicine? PACLITAXEL (PAK li TAX el) is a chemotherapy drug. It targets fast dividing cells, like cancer cells, and causes these cells to die. This medicine is used to treat ovarian cancer, breast cancer, lung cancer, Kaposi's sarcoma, and other cancers. This medicine may be used for other purposes; ask your health care provider or pharmacist if you have questions. COMMON BRAND NAME(S):  Onxol, Taxol What should I tell my health care provider before I take this medicine? They need to know if you have any of these conditions:  history of irregular heartbeat  liver disease  low blood counts, like low white cell, platelet, or red cell counts  lung or breathing disease, like asthma  tingling of the fingers or toes, or other nerve disorder  an unusual or allergic reaction to paclitaxel, alcohol, polyoxyethylated castor oil, other chemotherapy, other medicines, foods, dyes, or preservatives  pregnant or  trying to get pregnant  breast-feeding How should I use this medicine? This drug is given as an infusion into a vein. It is administered in a hospital or clinic by a specially trained health care professional. Talk to your pediatrician regarding the use of this medicine in children. Special care may be needed. Overdosage: If you think you have taken too much of this medicine contact a poison control center or emergency room at once. NOTE: This medicine is only for you. Do not share this medicine with others. What if I miss a dose? It is important not to miss your dose. Call your doctor or health care professional if you are unable to keep an appointment. What may interact with this medicine? Do not take this medicine with any of the following medications:  disulfiram  metronidazole This medicine may also interact with the following medications:  antiviral medicines for hepatitis, HIV or AIDS  certain antibiotics like erythromycin and clarithromycin  certain medicines for fungal infections like ketoconazole and itraconazole  certain medicines for seizures like carbamazepine, phenobarbital, phenytoin  gemfibrozil  nefazodone  rifampin  St. John's wort This list may not describe all possible interactions. Give your health care provider a list of all the medicines, herbs, non-prescription drugs, or dietary supplements you use. Also tell them if you smoke, drink alcohol, or use illegal drugs. Some items may interact with your medicine. What should I watch for while using this medicine? Your condition will be monitored carefully while you are receiving this medicine. You will need important blood work done while you are taking this medicine. This medicine can cause serious allergic reactions. To reduce your risk you will need to take other medicine(s) before treatment with this medicine. If you experience allergic reactions like skin rash, itching or hives, swelling of the face, lips,  or tongue, tell your doctor or health care professional right away. In some cases, you may be given additional medicines to help with side effects. Follow all directions for their use. This drug may make you feel generally unwell. This is not uncommon, as chemotherapy can affect healthy cells as well as cancer cells. Report any side effects. Continue your course of treatment even though you feel ill unless your doctor tells you to stop. Call your doctor or health care professional for advice if you get a fever, chills or sore throat, or other symptoms of a cold or flu. Do not treat yourself. This drug decreases your body's ability to fight infections. Try to avoid being around people who are sick. This medicine may increase your risk to bruise or bleed. Call your doctor or health care professional if you notice any unusual bleeding. Be careful brushing and flossing your teeth or using a toothpick because you may get an infection or bleed more easily. If you have any dental work done, tell your dentist you are receiving this medicine. Avoid taking products that contain aspirin, acetaminophen, ibuprofen, naproxen, or ketoprofen unless instructed by your doctor.  These medicines may hide a fever. Do not become pregnant while taking this medicine. Women should inform their doctor if they wish to become pregnant or think they might be pregnant. There is a potential for serious side effects to an unborn child. Talk to your health care professional or pharmacist for more information. Do not breast-feed an infant while taking this medicine. Men are advised not to father a child while receiving this medicine. This product may contain alcohol. Ask your pharmacist or healthcare provider if this medicine contains alcohol. Be sure to tell all healthcare providers you are taking this medicine. Certain medicines, like metronidazole and disulfiram, can cause an unpleasant reaction when taken with alcohol. The reaction  includes flushing, headache, nausea, vomiting, sweating, and increased thirst. The reaction can last from 30 minutes to several hours. What side effects may I notice from receiving this medicine? Side effects that you should report to your doctor or health care professional as soon as possible:  allergic reactions like skin rash, itching or hives, swelling of the face, lips, or tongue  breathing problems  changes in vision  fast, irregular heartbeat  high or low blood pressure  mouth sores  pain, tingling, numbness in the hands or feet  signs of decreased platelets or bleeding - bruising, pinpoint red spots on the skin, black, tarry stools, blood in the urine  signs of decreased red blood cells - unusually weak or tired, feeling faint or lightheaded, falls  signs of infection - fever or chills, cough, sore throat, pain or difficulty passing urine  signs and symptoms of liver injury like dark yellow or brown urine; general ill feeling or flu-like symptoms; light-colored stools; loss of appetite; nausea; right upper belly pain; unusually weak or tired; yellowing of the eyes or skin  swelling of the ankles, feet, hands  unusually slow heartbeat Side effects that usually do not require medical attention (report to your doctor or health care professional if they continue or are bothersome):  diarrhea  hair loss  loss of appetite  muscle or joint pain  nausea, vomiting  pain, redness, or irritation at site where injected  tiredness This list may not describe all possible side effects. Call your doctor for medical advice about side effects. You may report side effects to FDA at 1-800-FDA-1088. Where should I keep my medicine? This drug is given in a hospital or clinic and will not be stored at home. NOTE: This sheet is a summary. It may not cover all possible information. If you have questions about this medicine, talk to your doctor, pharmacist, or health care provider.   2020 Elsevier/Gold Standard (2017-02-03 13:14:55)    Coronavirus (COVID-19) Are you at risk?  Are you at risk for the Coronavirus (COVID-19)?  To be considered HIGH RISK for Coronavirus (COVID-19), you have to meet the following criteria:  . Traveled to [place], [place], [place], [place] or [place]; or in the [place] to [place], [place], [place], or [place]; and have fever, cough, and shortness of breath within the last 2 weeks of travel OR . Been in close contact with a person diagnosed with COVID-19 within the last 2 weeks and have fever, cough, and shortness of breath . IF YOU DO NOT MEET THESE CRITERIA, YOU ARE CONSIDERED LOW RISK FOR COVID-19.  What to do if you are HIGH RISK for COVID-19?  Marland Kitchen If you are having a medical emergency, call 911. . Seek medical care right away. Before you go to a  doctor's office, urgent care or emergency department, call ahead and tell them about your recent travel, contact with someone diagnosed with COVID-19, and your symptoms. You should receive instructions from your physician's office regarding next steps of care.  . When you arrive at healthcare provider, tell the healthcare staff immediately you have returned from visiting [place], [place], [place], [place] or [place]; or traveled in the [place] to [place], [place], [place], or [place]; in the last two weeks or you have been in close contact with a person diagnosed with COVID-19 in the last 2 weeks.   . Tell the health care staff about your symptoms: fever, cough and shortness of breath. . After you have been seen by a medical provider, you will be either: o Tested for (COVID-19) and discharged home on quarantine except to seek medical care if symptoms worsen, and asked to  - Stay home and avoid contact with others until you get your results (4-5 days)  - Avoid travel on public transportation if possible (such as bus, train, or airplane) or o Sent to the Emergency Department by  EMS for evaluation, COVID-19 testing, and possible admission depending on your condition and test results.  What to do if you are LOW RISK for COVID-19?  Reduce your risk of any infection by using the same precautions used for avoiding the common cold or flu:  Marland Kitchen Wash your hands often with soap and warm water for at least 20 seconds.  If soap and water are not readily available, use an alcohol-based hand sanitizer with at least 60% alcohol.  . If coughing or sneezing, cover your mouth and nose by coughing or sneezing into the elbow areas of your shirt or coat, into a tissue or into your sleeve (not your hands). . Avoid shaking hands with others and consider head nods or verbal greetings only. . Avoid touching your eyes, nose, or mouth with unwashed hands.  . Avoid close contact with people who are sick. . Avoid places or events with large numbers of people in one location, like concerts or sporting events. . Carefully consider travel plans you have or are making. . If you are planning any travel outside or inside the [place], visit the CDC's Travelers' Health webpage for the latest health notices. . If you have some symptoms but not all symptoms, continue to monitor at home and seek medical attention if your symptoms worsen. . If you are having a medical emergency, call 911.   [facility] / e-Visit: [email or web address]         [facility]: [facility]: [phone]                   [facility]: [phone]

## 2019-01-24 NOTE — Progress Notes (Signed)
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   Gave my card for any additional financial questions or concerns.

## 2019-01-25 ENCOUNTER — Telehealth: Payer: Self-pay | Admitting: *Deleted

## 2019-01-26 ENCOUNTER — Ambulatory Visit
Admission: RE | Admit: 2019-01-26 | Discharge: 2019-01-26 | Disposition: A | Discharge status: Home / Self Care | Payer: Medicare HMO | Source: Ambulatory Visit | Attending: Radiation Oncology | Admitting: Radiation Oncology

## 2019-01-26 ENCOUNTER — Other Ambulatory Visit: Payer: Self-pay

## 2019-01-26 ENCOUNTER — Encounter: Payer: Self-pay | Admitting: Radiation Oncology

## 2019-01-26 VITALS — BP 130/97 | HR 102 | Temp 98.0°F | Resp 20

## 2019-01-26 DIAGNOSIS — E7800 Pure hypercholesterolemia, unspecified: Secondary | ICD-10-CM | POA: Insufficient documentation

## 2019-01-26 DIAGNOSIS — G9389 Other specified disorders of brain: Secondary | ICD-10-CM | POA: Insufficient documentation

## 2019-01-26 DIAGNOSIS — K769 Liver disease, unspecified: Secondary | ICD-10-CM | POA: Diagnosis not present

## 2019-01-26 DIAGNOSIS — F17210 Nicotine dependence, cigarettes, uncomplicated: Secondary | ICD-10-CM | POA: Insufficient documentation

## 2019-01-26 DIAGNOSIS — Z801 Family history of malignant neoplasm of trachea, bronchus and lung: Secondary | ICD-10-CM | POA: Insufficient documentation

## 2019-01-26 DIAGNOSIS — C3491 Malignant neoplasm of unspecified part of right bronchus or lung: Secondary | ICD-10-CM

## 2019-01-26 DIAGNOSIS — Z9221 Personal history of antineoplastic chemotherapy: Secondary | ICD-10-CM | POA: Insufficient documentation

## 2019-01-26 DIAGNOSIS — I6782 Cerebral ischemia: Secondary | ICD-10-CM | POA: Diagnosis not present

## 2019-01-26 DIAGNOSIS — Z79899 Other long term (current) drug therapy: Secondary | ICD-10-CM | POA: Diagnosis not present

## 2019-01-26 DIAGNOSIS — Z8673 Personal history of transient ischemic attack (TIA), and cerebral infarction without residual deficits: Secondary | ICD-10-CM | POA: Diagnosis not present

## 2019-01-26 DIAGNOSIS — F0390 Unspecified dementia without behavioral disturbance: Secondary | ICD-10-CM | POA: Diagnosis not present

## 2019-01-26 DIAGNOSIS — R69 Illness, unspecified: Secondary | ICD-10-CM | POA: Diagnosis not present

## 2019-01-26 DIAGNOSIS — J90 Pleural effusion, not elsewhere classified: Secondary | ICD-10-CM | POA: Insufficient documentation

## 2019-01-26 DIAGNOSIS — I10 Essential (primary) hypertension: Secondary | ICD-10-CM | POA: Insufficient documentation

## 2019-01-26 DIAGNOSIS — Z7982 Long term (current) use of aspirin: Secondary | ICD-10-CM | POA: Insufficient documentation

## 2019-01-26 DIAGNOSIS — Z800 Family history of malignant neoplasm of digestive organs: Secondary | ICD-10-CM | POA: Insufficient documentation

## 2019-01-26 DIAGNOSIS — R590 Localized enlarged lymph nodes: Secondary | ICD-10-CM | POA: Diagnosis not present

## 2019-01-26 DIAGNOSIS — N329 Bladder disorder, unspecified: Secondary | ICD-10-CM | POA: Insufficient documentation

## 2019-01-26 DIAGNOSIS — C342 Malignant neoplasm of middle lobe, bronchus or lung: Secondary | ICD-10-CM | POA: Diagnosis not present

## 2019-01-26 NOTE — Patient Instructions (Signed)
Coronavirus (COVID-19) Are you at risk?  Are you at risk for the Coronavirus (COVID-19)?  To be considered HIGH RISK for Coronavirus (COVID-19), you have to meet the following criteria:  . Traveled to [place], [place], [place], [place] or [place]; or in the [place] to [place], [place], [place], or [place]; and have fever, cough, and shortness of breath within the last 2 weeks of travel OR . Been in close contact with a person diagnosed with COVID-19 within the last 2 weeks and have fever, cough, and shortness of breath . IF YOU DO NOT MEET THESE CRITERIA, YOU ARE CONSIDERED LOW RISK FOR COVID-19.  What to do if you are HIGH RISK for COVID-19?  . If you are having a medical emergency, call 911. . Seek medical care right away. Before you go to a doctor's office, urgent care or emergency department, call ahead and tell them about your recent travel, contact with someone diagnosed with COVID-19, and your symptoms. You should receive instructions from your physician's office regarding next steps of care.  . When you arrive at healthcare provider, tell the healthcare staff immediately you have returned from visiting [place], [place], [place], [place] or [place]; or traveled in the [place] to [place], [place], [place], or [place]; in the last two weeks or you have been in close contact with a person diagnosed with COVID-19 in the last 2 weeks.   . Tell the health care staff about your symptoms: fever, cough and shortness of breath. . After you have been seen by a medical provider, you will be either: o Tested for (COVID-19) and discharged home on quarantine except to seek medical care if symptoms worsen, and asked to  - Stay home and avoid contact with others until you get your results (4-5 days)  - Avoid travel on public transportation if possible (such as bus, train, or airplane) or o Sent to the Emergency Department by EMS for evaluation, COVID-19 testing, and possible  admission depending on your condition and test results.  What to do if you are LOW RISK for COVID-19?  Reduce your risk of any infection by using the same precautions used for avoiding the common cold or flu:  . Wash your hands often with soap and warm water for at least 20 seconds.  If soap and water are not readily available, use an alcohol-based hand sanitizer with at least 60% alcohol.  . If coughing or sneezing, cover your mouth and nose by coughing or sneezing into the elbow areas of your shirt or coat, into a tissue or into your sleeve (not your hands). . Avoid shaking hands with others and consider head nods or verbal greetings only. . Avoid touching your eyes, nose, or mouth with unwashed hands.  . Avoid close contact with people who are sick. . Avoid places or events with large numbers of people in one location, like concerts or sporting events. . Carefully consider travel plans you have or are making. . If you are planning any travel outside or inside the [place], visit the CDC's Travelers' Health webpage for the latest health notices. . If you have some symptoms but not all symptoms, continue to monitor at home and seek medical attention if your symptoms worsen. . If you are having a medical emergency, call 911.   ADDITIONAL HEALTHCARE OPTIONS FOR PATIENTS  [facility] / e-Visit: [email or web address]         [facility]: [phone]  [facility]: [phone]                   [facility]: [phone]   

## 2019-01-26 NOTE — Progress Notes (Signed)
Radiation Oncology         [phone] ________________________________  Initial Outpatient Consultation  Name: Richard [name] MRN: [id number]  Date: 01/26/2019  DOB: 1941/06/16  DU:[id number], [name], NP  Nicholas Lose, MD   REFERRING PHYSICIAN: Nicholas Lose, MD  DIAGNOSIS: The encounter diagnosis was Stage III squamous cell carcinoma of right lung ([place]).   At least stage IIIA(T3, N2, M0) non-small cell lung cancer, squamous cell carcinoma presented with large right middle lobe lung mass with postobstructive pneumonia as well as right hilar and mediastinal lymphadenopathy diagnosed in July 2020.  HISTORY OF PRESENT ILLNESS::Richard [name] is a 78 y.o. male who is accompanied by his brother via speaker phone due to his dementia. The patient presented to the ED on 12/14/2018 with a chief complaint of groin pain. On abdomen/pelvis CT performed at that time, mass-like density was seen in the right middle lobe and a suspicious mass in the bladder. This was followed by a chest CT, which revealed a large central right necrotic mass measuring approximately 5.3 cm, postobstructive atelectasis or pneumonia in the right middle lobe, small right pleural effusion, and right hilar and mediastinal adenopathy. The patient is a current smoker with a long-term history of 60 years.  The patient underwent a biopsy on 12/16/2018 showing: squamous cell carcinoma. He was referred to Dr. Julien Nordmann on 12/28/2018.   He proceeded to staging scans on 01/04/2019. PET scan showed the known 5.3 cm right hilar mass, as well as suspicious but indeterminate small right pleural effusion and a small right hepatic lobe focus of metabolic activity. Brain MRI showed no evidence of metastatic disease.  Dr. Julien Nordmann began the patient on carboplatin and paclitaxel on 01/24/2019. He plans to monitor the patient's pleural effusion and suspicious small liver focus on subsequent imaging.  PREVIOUS RADIATION THERAPY: No, per discussion with  patient's brother.  PAST MEDICAL HISTORY:  Past Medical History:  Diagnosis Date  . Dementia ([place])   . High cholesterol   . Hypertension   . Lung mass 12/2018    PAST SURGICAL HISTORY: Past Surgical History:  Procedure Laterality Date  . JOINT REPLACEMENT    . VIDEO BRONCHOSCOPY WITH ENDOBRONCHIAL ULTRASOUND N/A 12/16/2018   Procedure: VIDEO BRONCHOSCOPY WITH ENDOBRONCHIAL ULTRASOUND AND FLUROSCOPY;  Surgeon: [name], MD;  Location: [facility];  Service: Pulmonary;  Laterality: N/A;    FAMILY HISTORY:  Family History  Problem Relation Age of Onset  . Stomach cancer Mother   . COPD Father   . Lung cancer Brother   . Lung cancer Brother     SOCIAL HISTORY:  Social History   Tobacco Use  . Smoking status: Current Some Day Smoker    Packs/day: 1.00    Years: 60.00    Pack years: 60.00    Types: Cigarettes  . Smokeless tobacco: Never Used  Substance Use Topics  . Alcohol use: Yes    Comment: occasional  . Drug use: No    ALLERGIES: No Known Allergies  MEDICATIONS:  Current Outpatient Medications  Medication Sig Dispense Refill  . aspirin (ASPIRIN 81) 81 MG EC tablet Take 1 tablet (81 mg total) by mouth daily. 90 tablet 3  . atorvastatin (LIPITOR) 20 MG tablet Take 1 tablet (20 mg total) by mouth every evening. 90 tablet 1  . HYDROcodone-acetaminophen (NORCO/VICODIN) 5-325 MG tablet Take 1 tablet by mouth every 4 (four) hours as needed for moderate pain. 12 tablet 0  . Omega-3 Fatty Acids (FISH OIL PO) Take 1 capsule by mouth daily.    Marland Kitchen  prochlorperazine (COMPAZINE) 10 MG tablet Take 1 tablet (10 mg total) by mouth every 6 (six) hours as needed for nausea or vomiting. 30 tablet 0  . nicotine (NICODERM CQ - DOSED IN MG/24 HR) 7 mg/24hr patch Place 1 patch (7 mg total) onto the skin daily. (Patient not taking: Reported on 12/31/2018) 28 patch 0   No current facility-administered medications for this encounter.     REVIEW OF SYSTEMS:  A 10+ POINT REVIEW OF SYSTEMS  WAS OBTAINED including neurology, dermatology, psychiatry, cardiac, respiratory, lymph, extremities, GI, GU, musculoskeletal, constitutional, reproductive, HEENT. The patient has dementia and is unable to report or quantify pain. His daughter reports some anorexia and notes he is drinking Ensure daily. He denies any other symptoms today.   PHYSICAL EXAM:  tympanic temperature is 98 F (36.7 C). His blood pressure is 130/97 (abnormal) and his pulse is 102 (abnormal). His respiration is 20 and oxygen saturation is 97%.   General: Alert and oriented, in no acute distress. The patient is quite agitated. Patient remains in a wheelchair throughout examination.  HEENT: Head is normocephalic. Extraocular movements are intact. Oropharynx is clear. Neck: Neck is supple, no palpable cervical or supraclavicular lymphadenopathy. Heart: Regular in rate and rhythm with no murmurs, rubs, or gallops. Chest: Clear to auscultation bilaterally, with no rhonchi, wheezes, or rales. Abdomen: Soft, nontender, nondistended, with no rigidity or guarding. Extremities: No cyanosis or edema. Lymphatics: see Neck Exam Skin: No concerning lesions. Musculoskeletal: symmetric strength and muscle tone throughout. Neurologic: Cranial nerves II through XII are grossly intact. No obvious focalities. Speech is fluent. Coordination is intact. Psychiatric: Judgment and insight are intact. Affect is appropriate. Patient kept complaining of needing to use the restroom. Turns out he had an impaction that was taken care of by one our nurses. Following that, he was less agitated.   ECOG = 2  0 - Asymptomatic (Fully active, able to carry on all predisease activities without restriction)  1 - Symptomatic but completely ambulatory (Restricted in physically strenuous activity but ambulatory and able to carry out work of a light or sedentary nature. For example, light housework, office work)  2 - Symptomatic, <50% in bed during the day  (Ambulatory and capable of all self care but unable to carry out any work activities. Up and about more than 50% of waking hours)  3 - Symptomatic, >50% in bed, but not bedbound (Capable of only limited self-care, confined to bed or chair 50% or more of waking hours)  4 - Bedbound (Completely disabled. Cannot carry on any self-care. Totally confined to bed or chair)  5 - Death   Eustace Pen MM, Creech RH, Tormey DC, et al. [phone]). "Toxicity and response criteria of the [facility]". [place] Oncol. 5 (6): 649-55  LABORATORY DATA:  Lab Results  Component Value Date   WBC 17.3 (H) 01/24/2019   HGB 13.0 01/24/2019   HCT 40.6 01/24/2019   MCV 62.3 (L) 01/24/2019   PLT 595 (H) 01/24/2019   NEUTROABS 12.1 (H) 01/24/2019   Lab Results  Component Value Date   NA 143 01/24/2019   K 3.9 01/24/2019   CL 107 01/24/2019   CO2 22 01/24/2019   GLUCOSE 159 (H) 01/24/2019   CREATININE 0.92 01/24/2019   CALCIUM 10.9 (H) 01/24/2019      RADIOGRAPHY: Mr Jeri Cos AS Contrast  Result Date: 01/04/2019 CLINICAL DATA:  Non-small cell lung cancer.  Staging. EXAM: MRI HEAD WITHOUT AND WITH CONTRAST TECHNIQUE: Multiplanar, multiecho pulse  sequences of the brain and surrounding structures were obtained without and with intravenous contrast. CONTRAST:  6 cc Gadavist COMPARISON:  None. FINDINGS: Brain: Diffusion imaging does not show any acute or subacute infarction. No focal abnormality affects the brainstem or cerebellum. Cerebral hemispheres show advanced generalized atrophy. There is old cortical and subcortical infarction in the right occipital lobe and posterior parietal lobe. Mild chronic small-vessel ischemic change of the deep white matter. The ventricles are quite prominent, in proportion to the degree of generalized atrophy. Normal pressure hydrocephalus not excluded but not favored. No mass lesion, hemorrhage or extra-axial collection. After contrast administration, no abnormal  enhancement occurs. Vascular: Major vessels at the base of the brain show flow. Skull and upper cervical spine: Negative Sinuses/Orbits: Clear/normal Other: None IMPRESSION: No evidence metastatic disease. Advanced chronic generalized brain atrophy and ventriculomegaly. 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 Electronically Signed   By: Nelson Chimes M.D.   On: 01/04/2019 17:30   Nm Pet Image Initial (pi) Skull Base To Thigh  Result Date: 01/04/2019 CLINICAL DATA:  Initial treatment strategy for right lung mass. EXAM: NUCLEAR MEDICINE PET SKULL BASE TO THIGH TECHNIQUE: 8.2 mCi F-18 FDG was injected intravenously. Full-ring PET imaging was performed from the skull base to thigh after the radiotracer. CT data was obtained and used for attenuation correction and anatomic localization. Fasting blood glucose: 97 mg/dl COMPARISON:  Chest CT from 12/14/2018 FINDINGS: Mediastinal blood pool activity: SUV max 2.6 Liver activity: SUV max NA NECK: Unusually prominent but bilaterally symmetric activity in the salpingopharyngeus muscles bilaterally, probably incidental. Incidental CT findings: Prominent ventricular system, correlate with MRI brain. CHEST: The right hilar mass predominantly seems to have right upper lobe and perhaps right middle lobe components, has maximum SUV of 28.9. Associated hypermetabolic activity measures 5.3 by 4.9 cm. There is combination of volume loss and drowned lung appearance in the right middle lobe with generalized accentuated metabolic activity maximum SUV of about 7.7. A small right pleural effusion has low-grade metabolic activity, representative internal SUV 2.4. Right lower paratracheal lymph node at the level of the carina 1.2 cm in short axis, maximum SUV 4.9. A subcarinal node measuring 0.6 cm in short axis on image 82/4 has maximum SUV of 4.5. Incidental CT findings: Centrilobular emphysema. 5 mm left lower lobe nodule in the posterior basal  segment is not perceptibly hypermetabolic but is below sensitive PET-CT size thresholds. ABDOMEN/PELVIS: In upper portion of the right hepatic lobe, a small focus of accentuated metabolic activity is present, maximum SUV of 4.9, background liver activity has a maximum SUV of about 4.0. Incidental CT findings: Aortoiliac atherosclerotic vascular disease. SKELETON: In the vicinity of the inferior tip of the right scapula there is a small focus of accentuated metabolic activity, maximum SUV 5.0. I do not see an obvious underlying bony or soft tissue abnormality in the scapular tip or adjacent musculature. It could be that this is simply physiologic adjacent muscular activity but attention to this region is recommended. There is activity in the right antecubital fossa and adjacent vasculature likely related to small extravasation of radiopharmaceutical during injection. Incidental CT findings: Left total hip prosthesis. IMPRESSION: 1. Approximately 5.3 cm right hilar mass with upper lobe and likely middle lobe involvement, maximum SUV 28.9, compatible with malignancy. There is a mildly hypermetabolic right lower paratracheal lymph node and mild hypermetabolic activity in a small subcarinal lymph node. 2. Small right pleural effusion with low-grade metabolic activity, representative internal SUV 2.4. This is indeterminate  for malignancy. 3. The 5 mm left lower lobe nodule in the posterior basal segment is not perceptibly hypermetabolic but is below sensitive PET-CT size thresholds. 4. In the upper portion of the right hepatic lobe there is a small focus of metabolic activity, maximum SUV of 4.9 compared to background liver activity maximum SUV of 4.0. The appearance raises suspicion for a small metastatic lesion. This could be confirmed by hepatic protocol MRI with and without contrast, if clinically warranted. 5. Small focus of activity near the inferior tip of the right scapula. No corresponding bony or soft tissue  lesion is perceived on the CT data. This may simply be physiologic activity adjacent to the scapular tip but the area merits surveillance on follow up chest CT examinations. 6. Combination of volume loss and drowned lung appearance of the right middle lobe which has diffuse moderate activity which is likely inflammatory. 7. Other imaging findings of potential clinical significance: Aortic Atherosclerosis (ICD10-I70.0) and Emphysema (ICD10-J43.9). Left total hip prosthesis. Electronically Signed   By: Van Clines M.D.   On: 01/04/2019 16:43      IMPRESSION: At LeastStage IIIA (T3, N2  M0, ) Non-Small Cell Lung Cancer, Squamous Cell Carcinoma  The patient has severe dementia and  We are unsure if he will be able to hold still for his treatments. According to his brother, the patient is most comfortable when accompanied by his daughter, and we will arrange for this for his daily treatments.  Today, I talked to the patient's brother about the findings and work-up thus far.  We discussed the natural history of lung cancer and general treatment, highlighting the role of radiotherapy in the management. I discussed the available radiation techniques, and focused on the details of logistics and delivery. We reviewed the anticipated acute and late sequelae associated with radiation in this setting. He was encouraged to ask questions that I answered to the best of my ability.   A patient consent form was discussed sent home with the patient for his daughter to review, as she is his HCPOA.  We retained a copy for our records. The patient will return next week for CT simulation, which will be concomitate with chemotherapy. He has started chemotherapy and seems to be tolerating this so far.   PLAN: Patient will return next week for CT simulation, with treatments to proceed with radiosensitizing chemotherapy. Anticipate 6 weeks of radiotherapy if the patient is able to cooperate with treatments.      ------------------------------------------------  Blair Promise, PhD, MD  This document serves as a record of services personally performed by Gery Pray, MD. It was created on his behalf by Wilburn Mylar, a trained medical scribe. The creation of this record is based on the scribe's personal observations and the provider's statements to them. This document has been checked and approved by the attending provider.

## 2019-01-30 ENCOUNTER — Other Ambulatory Visit: Payer: Self-pay

## 2019-01-30 ENCOUNTER — Other Ambulatory Visit (HOSPITAL_COMMUNITY): Payer: Medicare HMO

## 2019-01-30 ENCOUNTER — Inpatient Hospital Stay (HOSPITAL_COMMUNITY): Payer: Medicare HMO

## 2019-01-30 ENCOUNTER — Emergency Department (HOSPITAL_COMMUNITY): Payer: Medicare HMO

## 2019-01-30 ENCOUNTER — Inpatient Hospital Stay (HOSPITAL_COMMUNITY)
Admission: EM | Admit: 2019-01-30 | Discharge: 2019-02-11 | DRG: 871 | Disposition: A | Discharge status: Skilled Nursing Facility | Payer: Medicare HMO | Attending: Internal Medicine | Admitting: Internal Medicine

## 2019-01-30 ENCOUNTER — Encounter (HOSPITAL_COMMUNITY): Payer: Self-pay

## 2019-01-30 DIAGNOSIS — E860 Dehydration: Secondary | ICD-10-CM | POA: Diagnosis present

## 2019-01-30 DIAGNOSIS — I2699 Other pulmonary embolism without acute cor pulmonale: Secondary | ICD-10-CM

## 2019-01-30 DIAGNOSIS — Z79899 Other long term (current) drug therapy: Secondary | ICD-10-CM

## 2019-01-30 DIAGNOSIS — Z515 Encounter for palliative care: Secondary | ICD-10-CM

## 2019-01-30 DIAGNOSIS — E872 Acidosis, unspecified: Secondary | ICD-10-CM

## 2019-01-30 DIAGNOSIS — Z681 Body mass index (BMI) 19 or less, adult: Secondary | ICD-10-CM

## 2019-01-30 DIAGNOSIS — I959 Hypotension, unspecified: Secondary | ICD-10-CM | POA: Diagnosis not present

## 2019-01-30 DIAGNOSIS — K920 Hematemesis: Secondary | ICD-10-CM

## 2019-01-30 DIAGNOSIS — A419 Sepsis, unspecified organism: Secondary | ICD-10-CM | POA: Diagnosis not present

## 2019-01-30 DIAGNOSIS — Z741 Need for assistance with personal care: Secondary | ICD-10-CM | POA: Diagnosis not present

## 2019-01-30 DIAGNOSIS — E876 Hypokalemia: Secondary | ICD-10-CM | POA: Diagnosis present

## 2019-01-30 DIAGNOSIS — R0902 Hypoxemia: Secondary | ICD-10-CM | POA: Diagnosis not present

## 2019-01-30 DIAGNOSIS — R64 Cachexia: Secondary | ICD-10-CM | POA: Diagnosis present

## 2019-01-30 DIAGNOSIS — Z7189 Other specified counseling: Secondary | ICD-10-CM | POA: Diagnosis not present

## 2019-01-30 DIAGNOSIS — I4891 Unspecified atrial fibrillation: Secondary | ICD-10-CM | POA: Diagnosis not present

## 2019-01-30 DIAGNOSIS — E878 Other disorders of electrolyte and fluid balance, not elsewhere classified: Secondary | ICD-10-CM | POA: Diagnosis present

## 2019-01-30 DIAGNOSIS — N179 Acute kidney failure, unspecified: Secondary | ICD-10-CM | POA: Diagnosis present

## 2019-01-30 DIAGNOSIS — R1311 Dysphagia, oral phase: Secondary | ICD-10-CM | POA: Diagnosis not present

## 2019-01-30 DIAGNOSIS — R404 Transient alteration of awareness: Secondary | ICD-10-CM | POA: Diagnosis not present

## 2019-01-30 DIAGNOSIS — E7800 Pure hypercholesterolemia, unspecified: Secondary | ICD-10-CM | POA: Diagnosis present

## 2019-01-30 DIAGNOSIS — E43 Unspecified severe protein-calorie malnutrition: Secondary | ICD-10-CM | POA: Diagnosis not present

## 2019-01-30 DIAGNOSIS — Z7982 Long term (current) use of aspirin: Secondary | ICD-10-CM

## 2019-01-30 DIAGNOSIS — J189 Pneumonia, unspecified organism: Secondary | ICD-10-CM | POA: Diagnosis present

## 2019-01-30 DIAGNOSIS — R339 Retention of urine, unspecified: Secondary | ICD-10-CM | POA: Diagnosis present

## 2019-01-30 DIAGNOSIS — I2601 Septic pulmonary embolism with acute cor pulmonale: Secondary | ICD-10-CM | POA: Diagnosis not present

## 2019-01-30 DIAGNOSIS — E870 Hyperosmolality and hypernatremia: Secondary | ICD-10-CM | POA: Diagnosis present

## 2019-01-30 DIAGNOSIS — Z66 Do not resuscitate: Secondary | ICD-10-CM | POA: Diagnosis present

## 2019-01-30 DIAGNOSIS — Z800 Family history of malignant neoplasm of digestive organs: Secondary | ICD-10-CM

## 2019-01-30 DIAGNOSIS — K449 Diaphragmatic hernia without obstruction or gangrene: Secondary | ICD-10-CM | POA: Diagnosis not present

## 2019-01-30 DIAGNOSIS — I480 Paroxysmal atrial fibrillation: Secondary | ICD-10-CM | POA: Diagnosis present

## 2019-01-30 DIAGNOSIS — Z20828 Contact with and (suspected) exposure to other viral communicable diseases: Secondary | ICD-10-CM | POA: Diagnosis present

## 2019-01-30 DIAGNOSIS — C3491 Malignant neoplasm of unspecified part of right bronchus or lung: Secondary | ICD-10-CM

## 2019-01-30 DIAGNOSIS — Z85118 Personal history of other malignant neoplasm of bronchus and lung: Secondary | ICD-10-CM

## 2019-01-30 DIAGNOSIS — I4892 Unspecified atrial flutter: Secondary | ICD-10-CM | POA: Diagnosis present

## 2019-01-30 DIAGNOSIS — R0602 Shortness of breath: Secondary | ICD-10-CM | POA: Diagnosis not present

## 2019-01-30 DIAGNOSIS — I361 Nonrheumatic tricuspid (valve) insufficiency: Secondary | ICD-10-CM | POA: Diagnosis not present

## 2019-01-30 DIAGNOSIS — R279 Unspecified lack of coordination: Secondary | ICD-10-CM | POA: Diagnosis not present

## 2019-01-30 DIAGNOSIS — R531 Weakness: Secondary | ICD-10-CM | POA: Diagnosis not present

## 2019-01-30 DIAGNOSIS — Z825 Family history of asthma and other chronic lower respiratory diseases: Secondary | ICD-10-CM

## 2019-01-30 DIAGNOSIS — R2689 Other abnormalities of gait and mobility: Secondary | ICD-10-CM | POA: Diagnosis not present

## 2019-01-30 DIAGNOSIS — R05 Cough: Secondary | ICD-10-CM | POA: Diagnosis not present

## 2019-01-30 DIAGNOSIS — I82401 Acute embolism and thrombosis of unspecified deep veins of right lower extremity: Secondary | ICD-10-CM | POA: Diagnosis present

## 2019-01-30 DIAGNOSIS — R918 Other nonspecific abnormal finding of lung field: Secondary | ICD-10-CM | POA: Diagnosis not present

## 2019-01-30 DIAGNOSIS — Z79891 Long term (current) use of opiate analgesic: Secondary | ICD-10-CM

## 2019-01-30 DIAGNOSIS — J9601 Acute respiratory failure with hypoxia: Secondary | ICD-10-CM

## 2019-01-30 DIAGNOSIS — R489 Unspecified symbolic dysfunctions: Secondary | ICD-10-CM | POA: Diagnosis not present

## 2019-01-30 DIAGNOSIS — J90 Pleural effusion, not elsewhere classified: Secondary | ICD-10-CM | POA: Diagnosis not present

## 2019-01-30 DIAGNOSIS — R6520 Severe sepsis without septic shock: Secondary | ICD-10-CM | POA: Diagnosis not present

## 2019-01-30 DIAGNOSIS — R000 Tachycardia, unspecified: Secondary | ICD-10-CM | POA: Diagnosis not present

## 2019-01-30 DIAGNOSIS — I10 Essential (primary) hypertension: Secondary | ICD-10-CM | POA: Diagnosis present

## 2019-01-30 DIAGNOSIS — J440 Chronic obstructive pulmonary disease with acute lower respiratory infection: Secondary | ICD-10-CM | POA: Diagnosis present

## 2019-01-30 DIAGNOSIS — J432 Centrilobular emphysema: Secondary | ICD-10-CM | POA: Diagnosis not present

## 2019-01-30 DIAGNOSIS — E785 Hyperlipidemia, unspecified: Secondary | ICD-10-CM | POA: Diagnosis not present

## 2019-01-30 DIAGNOSIS — Z743 Need for continuous supervision: Secondary | ICD-10-CM | POA: Diagnosis not present

## 2019-01-30 DIAGNOSIS — F0390 Unspecified dementia without behavioral disturbance: Secondary | ICD-10-CM | POA: Diagnosis present

## 2019-01-30 DIAGNOSIS — R58 Hemorrhage, not elsewhere classified: Secondary | ICD-10-CM | POA: Diagnosis not present

## 2019-01-30 DIAGNOSIS — C3490 Malignant neoplasm of unspecified part of unspecified bronchus or lung: Secondary | ICD-10-CM | POA: Diagnosis not present

## 2019-01-30 DIAGNOSIS — R69 Illness, unspecified: Secondary | ICD-10-CM | POA: Diagnosis not present

## 2019-01-30 DIAGNOSIS — I071 Rheumatic tricuspid insufficiency: Secondary | ICD-10-CM | POA: Diagnosis present

## 2019-01-30 DIAGNOSIS — M6281 Muscle weakness (generalized): Secondary | ICD-10-CM | POA: Diagnosis not present

## 2019-01-30 DIAGNOSIS — J80 Acute respiratory distress syndrome: Secondary | ICD-10-CM | POA: Diagnosis not present

## 2019-01-30 DIAGNOSIS — Z801 Family history of malignant neoplasm of trachea, bronchus and lung: Secondary | ICD-10-CM

## 2019-01-30 DIAGNOSIS — Z781 Physical restraint status: Secondary | ICD-10-CM

## 2019-01-30 DIAGNOSIS — Z87891 Personal history of nicotine dependence: Secondary | ICD-10-CM

## 2019-01-30 DIAGNOSIS — I2729 Other secondary pulmonary hypertension: Secondary | ICD-10-CM | POA: Diagnosis present

## 2019-01-30 HISTORY — DX: Acute kidney failure, unspecified: N17.9

## 2019-01-30 HISTORY — DX: Acute respiratory failure with hypoxia: J96.01

## 2019-01-30 HISTORY — DX: Sepsis, unspecified organism: A41.9

## 2019-01-30 HISTORY — DX: Other pulmonary embolism without acute cor pulmonale: I26.99

## 2019-01-30 HISTORY — DX: Sepsis, unspecified organism: R65.20

## 2019-01-30 LAB — CBC
HCT: 35.9 % — ABNORMAL LOW (ref 39.0–52.0)
Hemoglobin: 11.7 g/dL — ABNORMAL LOW (ref 13.0–17.0)
MCH: 20.4 pg — ABNORMAL LOW (ref 26.0–34.0)
MCHC: 32.6 g/dL (ref 30.0–36.0)
MCV: 62.7 fL — ABNORMAL LOW (ref 80.0–100.0)
Platelets: 186 10*3/uL (ref 150–400)
RBC: 5.73 MIL/uL (ref 4.22–5.81)
RDW: 17.2 % — ABNORMAL HIGH (ref 11.5–15.5)
WBC: 15.5 10*3/uL — ABNORMAL HIGH (ref 4.0–10.5)
nRBC: 0.4 % — ABNORMAL HIGH (ref 0.0–0.2)

## 2019-01-30 LAB — LACTIC ACID, PLASMA
Lactic Acid, Venous: 5.6 mmol/L (ref 0.5–1.9)
Lactic Acid, Venous: 5 mmol/L (ref 0.5–1.9)
Lactic Acid, Venous: 2.9 mmol/L (ref 0.5–1.9)

## 2019-01-30 LAB — URINALYSIS, ROUTINE W REFLEX MICROSCOPIC
Bacteria, UA: NONE SEEN
Bilirubin Urine: NEGATIVE
Glucose, UA: 50 mg/dL — AB
Ketones, ur: NEGATIVE mg/dL
Leukocytes,Ua: NEGATIVE
Nitrite: NEGATIVE
Protein, ur: NEGATIVE mg/dL
Specific Gravity, Urine: 1.014 (ref 1.005–1.030)
pH: 5 (ref 5.0–8.0)

## 2019-01-30 LAB — CBC WITH DIFFERENTIAL/PLATELET
Abs Immature Granulocytes: 0.22 10*3/uL — ABNORMAL HIGH (ref 0.00–0.07)
Basophils Absolute: 0 10*3/uL (ref 0.0–0.1)
Basophils Relative: 0 %
Eosinophils Absolute: 0 10*3/uL (ref 0.0–0.5)
Eosinophils Relative: 0 %
HCT: 41.6 % (ref 39.0–52.0)
Hemoglobin: 13 g/dL (ref 13.0–17.0)
Immature Granulocytes: 1 %
Lymphocytes Relative: 15 %
Lymphs Abs: 2.7 10*3/uL (ref 0.7–4.0)
MCH: 20.4 pg — ABNORMAL LOW (ref 26.0–34.0)
MCHC: 31.3 g/dL (ref 30.0–36.0)
MCV: 65.4 fL — ABNORMAL LOW (ref 80.0–100.0)
Monocytes Absolute: 1.4 10*3/uL — ABNORMAL HIGH (ref 0.1–1.0)
Monocytes Relative: 7 %
Neutro Abs: 14 10*3/uL — ABNORMAL HIGH (ref 1.7–7.7)
Neutrophils Relative %: 77 %
Platelets: 212 10*3/uL (ref 150–400)
RBC: 6.36 MIL/uL — ABNORMAL HIGH (ref 4.22–5.81)
RDW: 17.8 % — ABNORMAL HIGH (ref 11.5–15.5)
WBC: 18.3 10*3/uL — ABNORMAL HIGH (ref 4.0–10.5)
nRBC: 0.9 % — ABNORMAL HIGH (ref 0.0–0.2)

## 2019-01-30 LAB — POCT I-STAT EG7
Acid-Base Excess: 1 mmol/L (ref 0.0–2.0)
Bicarbonate: 24.7 mmol/L (ref 20.0–28.0)
Calcium, Ion: 1.39 mmol/L (ref 1.15–1.40)
HCT: 39 % (ref 39.0–52.0)
Hemoglobin: 13.3 g/dL (ref 13.0–17.0)
O2 Saturation: 99 %
Potassium: 4.2 mmol/L (ref 3.5–5.1)
Sodium: 147 mmol/L — ABNORMAL HIGH (ref 135–145)
TCO2: 26 mmol/L (ref 22–32)
pCO2, Ven: 35.7 mmHg — ABNORMAL LOW (ref 44.0–60.0)
pH, Ven: 7.448 — ABNORMAL HIGH (ref 7.250–7.430)
pO2, Ven: 150 mmHg — ABNORMAL HIGH (ref 32.0–45.0)

## 2019-01-30 LAB — COMPREHENSIVE METABOLIC PANEL
ALT: 19 U/L (ref 0–44)
AST: 21 U/L (ref 15–41)
Albumin: 2.5 g/dL — ABNORMAL LOW (ref 3.5–5.0)
Alkaline Phosphatase: 81 U/L (ref 38–126)
Anion gap: 18 — ABNORMAL HIGH (ref 5–15)
BUN: 50 mg/dL — ABNORMAL HIGH (ref 8–23)
CO2: 19 mmol/L — ABNORMAL LOW (ref 22–32)
Calcium: 11.5 mg/dL — ABNORMAL HIGH (ref 8.9–10.3)
Chloride: 110 mmol/L (ref 98–111)
Creatinine, Ser: 1.4 mg/dL — ABNORMAL HIGH (ref 0.61–1.24)
GFR calc Af Amer: 56 mL/min — ABNORMAL LOW (ref >60)
GFR calc non Af Amer: 48 mL/min — ABNORMAL LOW (ref >60)
Glucose, Bld: 114 mg/dL — ABNORMAL HIGH (ref 70–99)
Potassium: 4.5 mmol/L (ref 3.5–5.1)
Sodium: 147 mmol/L — ABNORMAL HIGH (ref 135–145)
Total Bilirubin: 0.9 mg/dL (ref 0.3–1.2)
Total Protein: 7 g/dL (ref 6.5–8.1)

## 2019-01-30 LAB — POC OCCULT BLOOD, ED: Fecal Occult Bld: POSITIVE — AB

## 2019-01-30 LAB — TYPE AND SCREEN
ABO/RH(D): AB POS
Antibody Screen: NEGATIVE

## 2019-01-30 LAB — I-STAT CREATININE, ED: Creatinine, Ser: 1.3 mg/dL — ABNORMAL HIGH (ref 0.61–1.24)

## 2019-01-30 LAB — PROTIME-INR
INR: 1.2 (ref 0.8–1.2)
Prothrombin Time: 14.7 seconds (ref 11.4–15.2)

## 2019-01-30 LAB — ABO/RH: ABO/RH(D): AB POS

## 2019-01-30 LAB — SARS CORONAVIRUS 2 BY RT PCR (HOSPITAL ORDER, PERFORMED IN ~~LOC~~ HOSPITAL LAB): SARS Coronavirus 2: NEGATIVE

## 2019-01-30 MED ORDER — VANCOMYCIN HCL 10 G IV SOLR
1500.0000 mg | Freq: Once | INTRAVENOUS | Status: AC
  Administered 2019-01-30: 1500 mg via INTRAVENOUS
  Filled 2019-01-30: qty 1500

## 2019-01-30 MED ORDER — VANCOMYCIN HCL IN DEXTROSE 1-5 GM/200ML-% IV SOLN
1000.0000 mg | INTRAVENOUS | Status: DC
  Administered 2019-01-31 – 2019-02-01 (×2): 1000 mg via INTRAVENOUS
  Filled 2019-01-30 (×3): qty 200

## 2019-01-30 MED ORDER — SODIUM CHLORIDE 0.9 % IV BOLUS
1000.0000 mL | Freq: Once | INTRAVENOUS | Status: AC
  Administered 2019-01-30: 1000 mL via INTRAVENOUS

## 2019-01-30 MED ORDER — SODIUM CHLORIDE 0.9 % IV SOLN
INTRAVENOUS | Status: AC
  Administered 2019-01-30: 22:00:00 via INTRAVENOUS

## 2019-01-30 MED ORDER — IOHEXOL 350 MG/ML SOLN
125.0000 mL | Freq: Once | INTRAVENOUS | Status: AC | PRN
  Administered 2019-01-30: 125 mL via INTRAVENOUS

## 2019-01-30 MED ORDER — HEPARIN BOLUS VIA INFUSION
4800.0000 [IU] | Freq: Once | INTRAVENOUS | Status: AC
  Administered 2019-01-30: 4800 [IU] via INTRAVENOUS
  Filled 2019-01-30: qty 4800

## 2019-01-30 MED ORDER — SODIUM CHLORIDE 0.9 % IV SOLN
2.0000 g | Freq: Two times a day (BID) | INTRAVENOUS | Status: DC
  Administered 2019-01-31 – 2019-02-03 (×7): 2 g via INTRAVENOUS
  Filled 2019-01-30 (×7): qty 2

## 2019-01-30 MED ORDER — SODIUM CHLORIDE 0.9 % IV SOLN
2.0000 g | Freq: Once | INTRAVENOUS | Status: AC
  Administered 2019-01-30: 2 g via INTRAVENOUS
  Filled 2019-01-30: qty 2

## 2019-01-30 MED ORDER — SODIUM CHLORIDE 0.9 % IV SOLN
80.0000 mg | Freq: Once | INTRAVENOUS | Status: AC
  Administered 2019-01-30: 80 mg via INTRAVENOUS
  Filled 2019-01-30: qty 80

## 2019-01-30 MED ORDER — HEPARIN (PORCINE) 25000 UT/250ML-% IV SOLN
1050.0000 [IU]/h | INTRAVENOUS | Status: DC
  Administered 2019-01-30: 1200 [IU]/h via INTRAVENOUS
  Administered 2019-01-31: 1100 [IU]/h via INTRAVENOUS
  Administered 2019-02-01: 1000 [IU]/h via INTRAVENOUS
  Administered 2019-02-02: 1050 [IU]/h via INTRAVENOUS
  Filled 2019-01-30 (×4): qty 250

## 2019-01-30 MED ORDER — ACETAMINOPHEN 325 MG PO TABS
650.0000 mg | ORAL_TABLET | Freq: Four times a day (QID) | ORAL | Status: DC | PRN
  Administered 2019-02-03: 650 mg via ORAL
  Filled 2019-01-30 (×2): qty 2

## 2019-01-30 MED ORDER — ATORVASTATIN CALCIUM 10 MG PO TABS
20.0000 mg | ORAL_TABLET | Freq: Every evening | ORAL | Status: DC
  Administered 2019-01-30 – 2019-02-10 (×10): 20 mg via ORAL
  Filled 2019-01-30 (×11): qty 2

## 2019-01-30 MED ORDER — ACETAMINOPHEN 650 MG RE SUPP: 650.0000 mg | Freq: Four times a day (QID) | RECTAL | Status: DC | PRN

## 2019-01-30 NOTE — Progress Notes (Signed)
[place] for heparin Indication: pulmonary embolus  Heparin Dosing Weight: 69.1 kg  Labs: Recent Labs    01/30/19 1542 01/30/19 1553  HGB 13.0 13.3  HCT 41.6 39.0  PLT 212  --   LABPROT 14.7  --   INR 1.2  --   CREATININE 1.40* 1.30*    Assessment: 68 yom presenting with acute bilateral PE, moderate clot burden with RHS. Pharmacy consulted to dose heparin. Not on anticoagulation PTA. CBC wnl. No active bleed issues documented - did report coffee-ground emesis and concern for BRBPR this AM. Initially given Protonix bolus but not continued as EDP has low suspicion for GIB.  Goal of Therapy:  Heparin level 0.3-0.7 units/ml Monitor platelets by anticoagulation protocol: Yes   Plan:  Heparin 4800 unit bolus Start heparin at 1200 units/h 6h heparin level Daily heparin level/CBC Monitor closely for s/sx bleeding   Elicia Lamp, PharmD, BCPS Clinical Pharmacist 01/30/2019 7:47 PM

## 2019-01-30 NOTE — Progress Notes (Signed)
Spoke with RN, patient no longer needs PIV, MD was able to obtain PIV access

## 2019-01-30 NOTE — ED Provider Notes (Signed)
Patient's care signed out to continue evaluation.  Patient presented with borderline blood pressure, shortness of breath, blood in stool.  On exam patient does have abdominal pain, dementia history, general weakness, dry mucous membranes.  Lactic acid significantly elevated.  Differential still broad at this point plan for CT scan to look for pulmonary embolism with shortness of breath and cancer history as well as CT angio abdomen pelvis to look for any signs of ischemic bowel with elevated lactate abdominal pain vomiting with blood in the stool. Abx and cultures ordered.  On reassessment patient generally weak and diffuse abdominal tenderness.  .Critical Care Performed by: Elnora Morrison, MD Authorized by: Elnora Morrison, MD   Critical care provider statement:    Critical care time (minutes):  85   Critical care start time:  01/30/2019 4:00 PM   Critical care end time:  01/30/2019 5:25 PM   Critical care was necessary to treat or prevent imminent or life-threatening deterioration of the following conditions:  Sepsis   Critical care was time spent personally by me on the following activities:  Evaluation of patient's response to treatment, examination of patient, ordering and performing treatments and interventions, ordering and review of laboratory studies, ordering and review of radiographic studies, pulse oximetry, re-evaluation of patient's condition, obtaining history from patient or surrogate and review of old charts  Sepsis - Repeat Assessment  Performed at:    [facility]     Blood pressure (!) 100/59, pulse (!) 118, temperature 98.7 F (37.1 C), temperature source Rectal, resp. rate 20, height 6\' 1"  (1.854 m), weight 69.1 kg, SpO2 95 %.  Heart:     Tachycardic  Lungs:    CTA  Capillary Refill:   <2 sec  Peripheral Pulse:   Radial pulse palpable  Skin:     Normal Color   CT scan results reviewed with radiologist.  Patient has bilateral pulmonary emboli.  Patient does not have  any active bleeding at this time, hemoglobin normal.  Heparin ordered through pharmacy.  Paged hospitalist for admission.      Elnora Morrison, MD 01/30/19 [phone]

## 2019-01-30 NOTE — Progress Notes (Signed)
Pharmacy Antibiotic Note  Richard [name] is a 78 y.o. male admitted on 01/30/2019 with pneumonia. Stage III NSCLC, started carboplatin/paclitaxel on 01/24/2019. Pharmacy has been consulted for vancomycin/cefepime dosing.  LA elevated 5.6. Afebrile, Tmax 98.7. Scr 0.92 from 8/10, on admission elevated at 1.3.  Plan: Vancomycin 1500 mg IV loading dose Vancomycin 1000 mg IV Q 24 hrs. Goal AUC 400-550. Expected AUC: 460.8 SCr used: 1.3 Cefepime 2 gm IV q12h Monitor cxs, renal fxn, clinical improvement, and abx de-escalation as needed  Height: 6\' 1"  (185.4 cm) Weight: 152 lb 5.4 oz (69.1 kg) IBW/kg (Calculated) : 79.9  Temp (24hrs), Avg:98.7 F (37.1 C), Min:98.7 F (37.1 C), Max:98.7 F (37.1 C)  Recent Labs  Lab 01/24/19 0843 01/30/19 1414  WBC 17.3*  --   CREATININE 0.92  --   LATICACIDVEN  --  5.6*    Estimated Creatinine Clearance: 66.8 mL/min (by C-G formula based on SCr of 0.92 mg/dL).    No Known Allergies  Antimicrobials this admission: 8/16 Vanc >> 8/16 Cefepime >>  Microbiology results: 8/16 BCx sent 8/16 UCx sent  Thank you for allowing pharmacy to be a part of this patient's care.  Berenice Bouton, PharmD PGY1 Pharmacy Resident Office phone: [phone]  01/30/2019 3:28 PM

## 2019-01-30 NOTE — ED Notes (Signed)
Patient transported to Ultrasound 

## 2019-01-30 NOTE — ED Notes (Signed)
IV started by MD using ultrasound.  Blood collected for specimens.

## 2019-01-30 NOTE — ED Triage Notes (Signed)
Pt hx of lung cancer and has decreased appetite and intake, increased weakness with dry cough.   Daughter states this AM x 1 episode of emesis with brown/ red fluids as well as blood noted in attends.  Pt denies any pain.   First chemo treatment for Lung CA on Monday.  Pt confused with no outward pain responses.

## 2019-01-30 NOTE — H&P (Addendum)
History and Physical    Richard [name] OBS:[id number] DOB: December 06, 1940 DOA: 01/30/2019  PCP: [name], NP Patient coming from: Home  Chief Complaint: No chief complaint  HPI: Richard [name] is a 78 y.o. male with medical history significant of stage III NSCLC started on carboplatin/paclitaxel on January 24, 2019, hypertension, hyperlipidemia, and dementia.  No history could be obtained from the patient.  Daughter at bedside states patient has advanced dementia and is usually oriented to self only.  States he has been very weak and tired recently.  Not eating or drinking much.  Yesterday she noticed that he was having difficulty breathing and coughing a lot.  This morning she noticed a small amount of blood on the chair where the patient was sitting and a small amount on the floor.  Later when she helped the patient go to the bathroom, she noticed a very small amount of blood in his pull ups.  States he has a history of hemorrhoids.  ED Course: Hypotensive, tachycardic, and tachypnic on arrival.  SPO2 in the mid to upper 80s on room air.  COVID-19 rapid test negative.  White count 18.3.  Lactic acid 5.6 >5.0.  FOBT positive.  Brown stool mixed with some bright red blood noted on rectal exam, no melena.  Hemoglobin 13.0, stable since labs done 6 days ago.  INR 1.2.  Bicarb 19, anion gap 18.  VBG with pH 7.44.  BUN 50, creatinine 1.4.  Baseline creatinine 0.7-0.9.  Corrected calcium 12.7.  LFTs normal.  UA not suggestive of infection.  Urine culture pending.  Blood culture x2 pending.  Chest x-ray showing stable dense consolidation and centrally obstructing mass at the right lung base.  CT angiogram showing acute PE in the left and right lower lobe pulmonary arteries and left upper lobe.  Overall clot burden is moderate.  There is also CT evidence of right heart strain (RV/LV ratio = 1.36), however, RV to LV ratio is similar to CT done 12/14/2018 at which time there was no PE.  Large right upper lobe  mass with collapse of the right middle lobe, mass appears larger than comparison exam.  CT angiogram abdomen pelvis without evidence of vascular compromise or bowel ischemia.  Showing distended bladder. Patient was started on vancomycin and cefepime.  Received 3 L IV fluid boluses.  Initially started on Protonix infusion which was later stopped.  Started on heparin infusion for PE.  Review of Systems:  All systems reviewed and apart from history of presenting illness, are negative.  Past Medical History:  Diagnosis Date   Dementia ([place])    High cholesterol    Hypertension    Lung mass 12/2018    Past Surgical History:  Procedure Laterality Date   JOINT REPLACEMENT     VIDEO BRONCHOSCOPY WITH ENDOBRONCHIAL ULTRASOUND N/A 12/16/2018   Procedure: VIDEO BRONCHOSCOPY WITH ENDOBRONCHIAL ULTRASOUND AND FLUROSCOPY;  Surgeon: [name], MD;  Location: [facility];  Service: Pulmonary;  Laterality: N/A;     reports that he has been smoking cigarettes. He has a 60.00 pack-year smoking history. He has never used smokeless tobacco. He reports current alcohol use. He reports that he does not use drugs.  No Known Allergies  Family History  Problem Relation Age of Onset   Stomach cancer Mother    COPD Father    Lung cancer Brother    Lung cancer Brother     Prior to Admission medications   Medication Sig Start Date End Date Taking? Authorizing Provider  aspirin (ASPIRIN 81) 81 MG EC tablet Take 1 tablet (81 mg total) by mouth daily. 03/18/18  Yes Clent Demark, PA-C  atorvastatin (LIPITOR) 20 MG tablet Take 1 tablet (20 mg total) by mouth every evening. 12/31/18  Yes [name], NP  Omega-3 Fatty Acids (FISH OIL PO) Take 1 capsule by mouth daily.   Yes [provider]  HYDROcodone-acetaminophen (NORCO/VICODIN) 5-325 MG tablet Take 1 tablet by mouth every 4 (four) hours as needed for moderate pain. Patient not taking: Reported on 01/30/2019 12/17/18   Geradine Girt, DO   nicotine (NICODERM CQ - DOSED IN MG/24 HR) 7 mg/24hr patch Place 1 patch (7 mg total) onto the skin daily. Patient not taking: Reported on 12/31/2018 12/18/18   Geradine Girt, DO  prochlorperazine (COMPAZINE) 10 MG tablet Take 1 tablet (10 mg total) by mouth every 6 (six) hours as needed for nausea or vomiting. 01/12/19   Heilingoetter, Cassandra L, PA-C    Physical Exam: Vitals:   01/30/19 1745 01/30/19 1800 01/30/19 1908 01/30/19 2001  BP:  109/72 118/75 109/76  Pulse: (!) 118  (!) 117 (!) 118  Resp:    19  Temp:    (!) 97.3 F (36.3 C)  TempSrc:    Oral  SpO2: 95%  (!) 84% 93%  Weight:      Height:        Physical Exam  Constitutional: No distress.  HENT:  Head: Normocephalic.  Very dry mucous membranes  Eyes: Right eye exhibits no discharge. Left eye exhibits no discharge.  Neck: Neck supple.  Cardiovascular: Regular rhythm and intact distal pulses.  Tachycardic  Pulmonary/Chest: Effort normal. He has no wheezes. He has no rales.  Abdominal: Soft. Bowel sounds are normal. He exhibits no distension. There is no abdominal tenderness. There is no rebound and no guarding.  Musculoskeletal:        General: No edema.  Neurological:  Awake and alert Moving all extremities spontaneously  Skin: Skin is warm and dry. He is not diaphoretic.     Labs on Admission: I have personally reviewed following labs and imaging studies  CBC: Recent Labs  Lab 01/24/19 0843 01/30/19 1542 01/30/19 1553  WBC 17.3* 18.3*  --   NEUTROABS 12.1* 14.0*  --   HGB 13.0 13.0 13.3  HCT 40.6 41.6 39.0  MCV 62.3* 65.4*  --   PLT 595* 212  --    Basic Metabolic Panel: Recent Labs  Lab 01/24/19 0843 01/30/19 1542 01/30/19 1553  NA 143 147* 147*  K 3.9 4.5 4.2  CL 107 110  --   CO2 22 19*  --   GLUCOSE 159* 114*  --   BUN 18 50*  --   CREATININE 0.92 1.40* 1.30*  CALCIUM 10.9* 11.5*  --    GFR: Estimated Creatinine Clearance: 47.2 mL/min (A) (by C-G formula based on SCr of 1.3 mg/dL  (H)). Liver Function Tests: Recent Labs  Lab 01/24/19 0843 01/30/19 1542  AST 26 21  ALT 23 19  ALKPHOS 105 81  BILITOT 0.4 0.9  PROT 8.7* 7.0  ALBUMIN 2.5* 2.5*   No results for input(s): LIPASE, AMYLASE in the last 168 hours. No results for input(s): AMMONIA in the last 168 hours. Coagulation Profile: Recent Labs  Lab 01/30/19 1542  INR 1.2   Cardiac Enzymes: No results for input(s): CKTOTAL, CKMB, CKMBINDEX, TROPONINI in the last 168 hours. BNP (last 3 results) No results for input(s): PROBNP in the last 8760  hours. HbA1C: No results for input(s): HGBA1C in the last 72 hours. CBG: No results for input(s): GLUCAP in the last 168 hours. Lipid Profile: No results for input(s): CHOL, HDL, LDLCALC, TRIG, CHOLHDL, LDLDIRECT in the last 72 hours. Thyroid Function Tests: No results for input(s): TSH, T4TOTAL, FREET4, T3FREE, THYROIDAB in the last 72 hours. Anemia Panel: No results for input(s): VITAMINB12, FOLATE, FERRITIN, TIBC, IRON, RETICCTPCT in the last 72 hours. Urine analysis:    Component Value Date/Time   COLORURINE YELLOW 01/30/2019 1325   APPEARANCEUR HAZY (A) 01/30/2019 1325   LABSPEC 1.014 01/30/2019 1325   PHURINE 5.0 01/30/2019 1325   GLUCOSEU 50 (A) 01/30/2019 1325   HGBUR SMALL (A) 01/30/2019 1325   BILIRUBINUR NEGATIVE 01/30/2019 [place] 01/30/2019 1325   PROTEINUR NEGATIVE 01/30/2019 1325   NITRITE NEGATIVE 01/30/2019 1325   LEUKOCYTESUR NEGATIVE 01/30/2019 1325    Radiological Exams on Admission: Ct Angio Chest Pe W And/or Wo Contrast  Result Date: 01/30/2019 CLINICAL DATA:  Initial lung cancer. Concern for pulmonary embolism or ischemia bowel. EXAM: CT ANGIOGRAPHY CHEST, ABDOMEN AND PELVIS TECHNIQUE: Multidetector CT imaging through the chest, abdomen and pelvis was performed using the standard protocol during bolus administration of intravenous contrast. Multiplanar reconstructed images and MIPs were obtained and reviewed to  evaluate the vascular anatomy. CONTRAST:  133mL OMNIPAQUE IOHEXOL 350 MG/ML SOLN COMPARISON:  CT 12/14/2018 FINDINGS: CTA CHEST FINDINGS Cardiovascular: Filling defect within the RIGHT lower lobe pulmonary artery (image 102/5) Filling defect within the LEFT lobe pulmonary arteries (image 97/5). Filling defect within the LEFT upper lobe pulmonary artery. Overall clot burden is moderate. The RIGHT ventricle ratio to LEFT ventricle ratio is greater than 1 (1.36). Of note this ratio is similar to CT of 12/14/2019 when there was no pulmonary emboli. Mediastinum/Nodes: No axillary supraclavicular adenopathy. Perihilar thickening in the RIGHT hilum similar prior. Lungs/Pleura: There is collapse of the RIGHT middle lobe. Mass in the RIGHT upper lobe seen on comparison exam which not well appreciated but measures potentially 4.7 cm on image 87/5 increased from 3.8 cm on prior. Musculoskeletal: No aggressive osseous lesion. Review of the MIP images confirms the above findings. CTA ABDOMEN AND PELVIS FINDINGS VASCULAR Aorta: No dissection or aneurysm. Celiac: Widely patent. SMA: Widely patent. Renals: Single renal artery on the LEFT and 2 renal arteries on the RIGHT all widely patent. IMA: Widely patent Inflow: Normal interval calcifications of the iliac arteries. Veins: No acute finding Review of the MIP images confirms the above findings. NON-VASCULAR Hepatobiliary: No focal hepatic lesion. Pancreas: No pancreatic inflammation. Spleen: Normal. Adrenals/Urinary Tract: adrenal glands and kidneys normal. Bladder is distended to level the umbilicus. Stomach/Bowel: Small hiatal hernia. The stomach duodenum normal. No evidence of bowel ischemia no bowel dilatation appendix normal. The colon and rectosigmoid colon are normal. Lymphatic: No abdominopelvic lymphadenopathy Reproductive: Prostate nodular dense the base the bladder. Other: Evaluation of the pelvic floor is limited by the LEFT hip prosthetic Musculoskeletal: No  aggressive osseous lesion Review of the MIP images confirms the above findings. IMPRESSION: Chest Impression: 1. Acute pulmonary embolism in the LEFT and RIGHT lower lobe pulmonary arteries and LEFT upper lobe. Overall clot burden is moderate. 2. Positive for acute PE with CT evidence of right heart strain (RV/LV Ratio = 1.36). This ratio suggests RIGHT heart strain; however, the ratio of RIGHT ventricle to LEFT ventricle diameter is similar to CT of 12/14/2018 at which time there was no PE. 3. Large RIGHT upper lobe mass with collapse of  the RIGHT middle lobe. Mass is difficult to define but appears larger than comparison exam Abdomen / Pelvis Impression: 1. No acute findings abdominal aorta. 2. No evidence of vascular compromise in the abdomen pelvis. 3. No evidence of bowel ischemia. 4. The bladder is distended.  Recommend decompression. Critical Value/emergent results were called by telephone at the time of interpretation on 01/30/2019 at 7:32 pm to Dr. ; Elnora Morrison , who verbally acknowledged these results. Electronically Signed   By: Suzy Bouchard M.D.   On: 01/30/2019 19:33   Dg Chest Portable 1 View  Result Date: 01/30/2019 CLINICAL DATA:  Shortness of breath dry cough. Increased weakness. Decreased appetite and intake. History of lung cancer. EXAM: PORTABLE CHEST 1 VIEW COMPARISON:  12/16/2018 and chest CT dated 12/14/2018. FINDINGS: Stable dense consolidation at the right lung base with a centrally obstructing mass better seen on the previous CT. Clear left lung. Normal sized heart. Minimal bilateral AC joint degenerative changes. IMPRESSION: Stable dense consolidation and centrally obstructing mass at the right lung base. Electronically Signed   By: Claudie Revering M.D.   On: 01/30/2019 13:20   Ct Angio Abd/pel W And/or Wo Contrast  Result Date: 01/30/2019 CLINICAL DATA:  Initial lung cancer. Concern for pulmonary embolism or ischemia bowel. EXAM: CT ANGIOGRAPHY CHEST, ABDOMEN AND PELVIS  TECHNIQUE: Multidetector CT imaging through the chest, abdomen and pelvis was performed using the standard protocol during bolus administration of intravenous contrast. Multiplanar reconstructed images and MIPs were obtained and reviewed to evaluate the vascular anatomy. CONTRAST:  162mL OMNIPAQUE IOHEXOL 350 MG/ML SOLN COMPARISON:  CT 12/14/2018 FINDINGS: CTA CHEST FINDINGS Cardiovascular: Filling defect within the RIGHT lower lobe pulmonary artery (image 102/5) Filling defect within the LEFT lobe pulmonary arteries (image 97/5). Filling defect within the LEFT upper lobe pulmonary artery. Overall clot burden is moderate. The RIGHT ventricle ratio to LEFT ventricle ratio is greater than 1 (1.36). Of note this ratio is similar to CT of 12/14/2019 when there was no pulmonary emboli. Mediastinum/Nodes: No axillary supraclavicular adenopathy. Perihilar thickening in the RIGHT hilum similar prior. Lungs/Pleura: There is collapse of the RIGHT middle lobe. Mass in the RIGHT upper lobe seen on comparison exam which not well appreciated but measures potentially 4.7 cm on image 87/5 increased from 3.8 cm on prior. Musculoskeletal: No aggressive osseous lesion. Review of the MIP images confirms the above findings. CTA ABDOMEN AND PELVIS FINDINGS VASCULAR Aorta: No dissection or aneurysm. Celiac: Widely patent. SMA: Widely patent. Renals: Single renal artery on the LEFT and 2 renal arteries on the RIGHT all widely patent. IMA: Widely patent Inflow: Normal interval calcifications of the iliac arteries. Veins: No acute finding Review of the MIP images confirms the above findings. NON-VASCULAR Hepatobiliary: No focal hepatic lesion. Pancreas: No pancreatic inflammation. Spleen: Normal. Adrenals/Urinary Tract: adrenal glands and kidneys normal. Bladder is distended to level the umbilicus. Stomach/Bowel: Small hiatal hernia. The stomach duodenum normal. No evidence of bowel ischemia no bowel dilatation appendix normal. The colon and  rectosigmoid colon are normal. Lymphatic: No abdominopelvic lymphadenopathy Reproductive: Prostate nodular dense the base the bladder. Other: Evaluation of the pelvic floor is limited by the LEFT hip prosthetic Musculoskeletal: No aggressive osseous lesion Review of the MIP images confirms the above findings. IMPRESSION: Chest Impression: 1. Acute pulmonary embolism in the LEFT and RIGHT lower lobe pulmonary arteries and LEFT upper lobe. Overall clot burden is moderate. 2. Positive for acute PE with CT evidence of right heart strain (RV/LV Ratio = 1.36). This ratio suggests  RIGHT heart strain; however, the ratio of RIGHT ventricle to LEFT ventricle diameter is similar to CT of 12/14/2018 at which time there was no PE. 3. Large RIGHT upper lobe mass with collapse of the RIGHT middle lobe. Mass is difficult to define but appears larger than comparison exam Abdomen / Pelvis Impression: 1. No acute findings abdominal aorta. 2. No evidence of vascular compromise in the abdomen pelvis. 3. No evidence of bowel ischemia. 4. The bladder is distended.  Recommend decompression. Critical Value/emergent results were called by telephone at the time of interpretation on 01/30/2019 at 7:32 pm to Dr. ; Elnora Morrison , who verbally acknowledged these results. Electronically Signed   By: Suzy Bouchard M.D.   On: 01/30/2019 19:33    EKG: Independently reviewed.  Sinus tachycardia, heart rate 113.  No prior EKG for comparison.  Assessment/Plan Principal Problem:   Severe sepsis (HCC) Active Problems:   Postobstructive pneumonia   Acute pulmonary embolism (HCC)   Acute respiratory failure with hypoxia (HCC)   AKI (acute kidney injury) (HCC)   Severe sepsis secondary to postobstructive pneumonia in the setting of stage III NSCLC Hypotensive, tachycardic, and tachypnic on arrival.  COVID-19 rapid test negative. White count 18.3.  Lactic acid 5.6 >5.0.  Chest x-ray showing stable dense consolidation and centrally  obstructing mass at the right lung base. Received 3 L IV fluid boluses.  Blood pressure improved.   -Continue IV fluid hydration -Continue vancomycin and cefepime -Continue to trend lactate -Continue to monitor white count -Blood culture x2 pending  Acute provoked bilateral pulmonary emboli in the setting of stage III NSCLC  Tachypneic on arrival.  SPO2 in the mid to upper 80s on room air intermittently. CT angiogram showing acute PE in the left and right lower lobe pulmonary arteries and left upper lobe.  Overall clot burden is moderate.  There is also CT evidence of right heart strain (RV/LV ratio = 1.36), however, RV to LV ratio is similar to CT done 12/14/2018 at which time there was no PE.  -Risks versus benefits of anticoagulation have been discussed with the patient's daughter given question of possible GI bleed.  Although no episodes of hematochezia, melena, or gross rectal bleeding in the ED.  Hemoglobin is normal and stable which is reassuring. -Continue heparin infusion -Echocardiogram -Bilateral lower extremity Dopplers  Acute hypoxic respiratory failure Secondary to acute PE and postobstructive pneumonia.  SPO2 in the mid to upper 80s intermittently.  Currently satting 93% on room air. -Supplemental oxygen -Management of PE and pneumonia as mentioned above  ?GI bleed No episodes of hematochezia, melena, or gross rectal bleeding in the ED. Per ED provider, rectal exam with brown stool mixed with some bright red blood. Daughter does report history of hemorrhoids.  Hemoglobin is normal and stable which is reassuring. CT angiogram abdomen pelvis without evidence of vascular compromise or bowel ischemia.  -Type and screen -Monitor serial CBCs  AKI Likely prerenal due to dehydration/sepsis.  Possibly obstructive given distended bladder on CT.  BUN 50, creatinine 1.4.  Baseline creatinine 0.7-0.9. -IV fluid hydration -Continue to monitor renal function -Foley for  decompression -Renal ultrasound  Addendum: Informed by nursing staff that patient has has 1.5 L urine output and has not required a Foley catheter.  Instructed to do bladder scan.  Hypercalcemia of malignancy Corrected calcium 12.7. -IV fluid hydration -Continue to monitor calcium level  Non-anion gap metabolic acidosis Likely related to lactic acidosis. Bicarb 19, anion gap 18.  VBG with pH 7.44.     -  IV fluid hydration -Trend lactate -Continue to monitor metabolic panel  Hypertension -Hold home antihypertensives in the setting of sepsis/hypotension  Hyperlipidemia -Continue home statin  DVT prophylaxis: Heparin Code Status: Full code.  Discussed with daughter at bedside. Family Communication: Daughter updated. Disposition Plan: Anticipate discharge after clinical improvement. Consults called: None Admission status: It is my clinical opinion that admission to INPATIENT is reasonable and necessary in this 78 y.o. male  presenting with severe sepsis secondary to postobstructive pneumonia, acute bilateral PE, and acute hypoxic respiratory failure.  Management plan mentioned above.  High risk of decompensation.  Needs close monitoring in the progressive care unit.  Given the aforementioned, the predictability of an adverse outcome is felt to be significant. I expect that the patient will require at least 2 midnights in the hospital to treat this condition.   The medical decision making on this patient was of high complexity and the patient is at high risk for clinical deterioration, therefore this is a level 3 visit.  Shela Leff MD [facility] Pager [phone]  If 7PM-7AM, please contact night-coverage [email or web address] Password [facility]  01/30/2019, 8:57 PM

## 2019-01-30 NOTE — ED Notes (Signed)
Pt restless and does turn intermittently from side to side.   Denies any current pain.  SAO2% on RA reading in the 80's/  Placed on oxygen @ 3 L/[place] and he states he feels better.

## 2019-01-30 NOTE — ED Provider Notes (Signed)
[facility] EMERGENCY DEPARTMENT Provider Note   CSN: [id number] Arrival date & time: 01/30/19  1202    History   Chief Complaint No chief complaint on file.   HPI Richard [name] is a 78 y.o. male.     HPI   78 year old male with a history of dementia, hypertension, hyperlipidemia, stage III squamous cell carcinoma, obstructive pneumonia, presents with concern for cough, shortness of breath, generalized weakness, nausea, vomiting, with coffee-ground emesis this morning and concern for bright red blood per rectum this a.m.  Daughter reports that he has not been eating or drinking well.  Reports he has had shortness of breath since his diagnosis, but his symptoms have worsened.  Reports over the last 2 days, he has had cough which at times has been productive of white sputum.  She denies known fevers.  Reports he developed nausea and vomiting last night, and this morning had brown-colored emesis.  Reports brown-colored stool with bright red blood, small streaks not mixed with stool.  Reports it was around the stool.  Denies seeing any clots.  Denies chest pain or abdominal pain.  No diarrhea.  Had first chemotherapy treatment on Monday 8/10.   Past Medical History:  Diagnosis Date  . Dementia ([place])   . High cholesterol   . Hypertension   . Lung mass 12/2018    Patient Active Problem List   Diagnosis Date Noted  . Poor appetite 01/05/2019  . Goals of care, counseling/discussion 12/28/2018  . Encounter for antineoplastic chemotherapy 12/28/2018  . 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 Dementia without behavioral disturbance ([place]) 12/16/2018  . Lung mass 12/15/2018  . Pelvic mass in male 12/15/2018  . Obstructive pneumonia 12/15/2018  . Hypertension     Past Surgical History:  Procedure Laterality Date  . JOINT REPLACEMENT    . VIDEO BRONCHOSCOPY WITH ENDOBRONCHIAL ULTRASOUND N/A 12/16/2018   Procedure: VIDEO BRONCHOSCOPY WITH  ENDOBRONCHIAL ULTRASOUND AND FLUROSCOPY;  Surgeon: [name], MD;  Location: [facility];  Service: Pulmonary;  Laterality: N/A;        Home Medications    Prior to Admission medications   Medication Sig Start Date End Date Taking? Authorizing Provider  aspirin (ASPIRIN 81) 81 MG EC tablet Take 1 tablet (81 mg total) by mouth daily. 03/18/18  Yes Clent Demark, PA-C  atorvastatin (LIPITOR) 20 MG tablet Take 1 tablet (20 mg total) by mouth every evening. 12/31/18  Yes [name], NP  Omega-3 Fatty Acids (FISH OIL PO) Take 1 capsule by mouth daily.   Yes [provider]  HYDROcodone-acetaminophen (NORCO/VICODIN) 5-325 MG tablet Take 1 tablet by mouth every 4 (four) hours as needed for moderate pain. Patient not taking: Reported on 01/30/2019 12/17/18   Geradine Girt, DO  nicotine (NICODERM CQ - DOSED IN MG/24 HR) 7 mg/24hr patch Place 1 patch (7 mg total) onto the skin daily. Patient not taking: Reported on 12/31/2018 12/18/18   Geradine Girt, DO  prochlorperazine (COMPAZINE) 10 MG tablet Take 1 tablet (10 mg total) by mouth every 6 (six) hours as needed for nausea or vomiting. 01/12/19   Heilingoetter, Cassandra L, PA-C    Family History Family History  Problem Relation Age of Onset  . Stomach cancer Mother   . COPD Father   . Lung cancer Brother   . Lung cancer Brother     Social History Social History   Tobacco Use  . Smoking status: Current Some Day  Smoker    Packs/day: 1.00    Years: 60.00    Pack years: 60.00    Types: Cigarettes  . Smokeless tobacco: Never Used  Substance Use Topics  . Alcohol use: Yes    Comment: occasional  . Drug use: No     Allergies   Patient has no known allergies.   Review of Systems Review of Systems  Constitutional: Positive for appetite change and fatigue. Negative for fever.  HENT: Negative for sore throat.   Eyes: Negative for visual disturbance.  Respiratory: Positive for cough and shortness of breath.    Cardiovascular: Negative for chest pain.  Gastrointestinal: Positive for abdominal pain (on exam denied on hx), anal bleeding, nausea and vomiting. Negative for blood in stool.  Genitourinary: Negative for difficulty urinating.  Musculoskeletal: Negative for back pain and neck stiffness.  Skin: Negative for rash.  Neurological: Negative for syncope and headaches.     Physical Exam Updated Vital Signs BP 100/62   Pulse (!) 124   Temp 98.7 F (37.1 C) (Rectal)   Resp 20   Ht 6\' 1"  (1.854 m)   Wt 69.1 kg   SpO2 91%   BMI 20.10 kg/m   Physical Exam Vitals signs and nursing note reviewed.  Constitutional:      General: He is not in acute distress.    Appearance: He is well-developed. He is not diaphoretic.  HENT:     Head: Normocephalic and atraumatic.  Eyes:     Conjunctiva/sclera: Conjunctivae normal.  Neck:     Musculoskeletal: Normal range of motion.  Cardiovascular:     Rate and Rhythm: Normal rate and regular rhythm.     Heart sounds: Normal heart sounds. No murmur. No friction rub. No gallop.   Pulmonary:     Effort: Pulmonary effort is normal. No respiratory distress.     Breath sounds: Normal breath sounds. No wheezing or rales.  Abdominal:     General: There is no distension.     Palpations: Abdomen is soft.     Tenderness: There is no abdominal tenderness. There is no guarding.  Skin:    General: Skin is warm and dry.  Neurological:     Mental Status: He is alert and oriented to person, place, and time.      ED Treatments / Results  Labs (all labs ordered are listed, but only abnormal results are displayed) Labs Reviewed  LACTIC ACID, PLASMA - Abnormal; Notable for the following components:      Result Value   Lactic Acid, Venous 5.6 (*)    All other components within normal limits  LACTIC ACID, PLASMA - Abnormal; Notable for the following components:   Lactic Acid, Venous 5.0 (*)    All other components within normal limits  URINALYSIS, ROUTINE W  REFLEX MICROSCOPIC - Abnormal; Notable for the following components:   APPearance HAZY (*)    Glucose, UA 50 (*)    Hgb urine dipstick SMALL (*)    All other components within normal limits  CBC WITH DIFFERENTIAL/PLATELET - Abnormal; Notable for the following components:   WBC 18.3 (*)    RBC 6.36 (*)    MCV 65.4 (*)    MCH 20.4 (*)    RDW 17.8 (*)    nRBC 0.9 (*)    All other components within normal limits  COMPREHENSIVE METABOLIC PANEL - Abnormal; Notable for the following components:   Sodium 147 (*)    CO2 19 (*)    Glucose, Bld  114 (*)    BUN 50 (*)    Creatinine, Ser 1.40 (*)    Calcium 11.5 (*)    Albumin 2.5 (*)    GFR calc non Af Amer 48 (*)    GFR calc Af Amer 56 (*)    Anion gap 18 (*)    All other components within normal limits  POC OCCULT BLOOD, ED - Abnormal; Notable for the following components:   Fecal Occult Bld POSITIVE (*)    All other components within normal limits  I-STAT CREATININE, ED - Abnormal; Notable for the following components:   Creatinine, Ser 1.30 (*)    All other components within normal limits  POCT I-STAT EG7 - Abnormal; Notable for the following components:   pH, Ven 7.448 (*)    pCO2, Ven 35.7 (*)    pO2, Ven 150.0 (*)    Sodium 147 (*)    All other components within normal limits  SARS CORONAVIRUS 2 (HOSPITAL ORDER, [facility])  URINE CULTURE  CULTURE, BLOOD (ROUTINE X 2)  CULTURE, BLOOD (ROUTINE X 2)  PROTIME-INR  CBC WITH DIFFERENTIAL/PLATELET  CBC WITH DIFFERENTIAL/PLATELET  TYPE AND SCREEN  ABO/RH    EKG EKG Interpretation  Date/Time:  Sunday January 30 2019 12:07:43 EDT Ventricular Rate:  133 PR Interval:    QRS Duration: 79 QT Interval:  288 QTC Calculation: 429 R Axis:   79 Text Interpretation:  Sinus tachycardia Consider right atrial enlargement No previous ECGs available Confirmed by Gareth Morgan [phone]) on 01/30/2019 4:31:42 PM   Radiology Dg Chest Portable 1 View  Result  Date: 01/30/2019 CLINICAL DATA:  Shortness of breath dry cough. Increased weakness. Decreased appetite and intake. History of lung cancer. EXAM: PORTABLE CHEST 1 VIEW COMPARISON:  12/16/2018 and chest CT dated 12/14/2018. FINDINGS: Stable dense consolidation at the right lung base with a centrally obstructing mass better seen on the previous CT. Clear left lung. Normal sized heart. Minimal bilateral AC joint degenerative changes. IMPRESSION: Stable dense consolidation and centrally obstructing mass at the right lung base. Electronically Signed   By: Claudie Revering M.D.   On: 01/30/2019 13:20    Procedures .Critical Care Performed by: Gareth Morgan, MD Authorized by: Gareth Morgan, MD   Critical care provider statement:    Critical care time (minutes):  45   Critical care was time spent personally by me on the following activities:  Evaluation of patient's response to treatment, examination of patient, ordering and performing treatments and interventions, ordering and review of laboratory studies, ordering and review of radiographic studies, pulse oximetry, re-evaluation of patient's condition, obtaining history from patient or surrogate and review of old charts Angiocath insertion  Date/Time: 01/30/2019 4:32 PM Performed by: Gareth Morgan, MD Authorized by: Gareth Morgan, MD  Consent: Verbal consent obtained. Consent given by: patient Required items: required blood products, implants, devices, and special equipment available Patient identity confirmed: verbally with patient Time out: Immediately prior to procedure a "time out" was called to verify the correct patient, procedure, equipment, support staff and site/side marked as required. Preparation: Patient was prepped and draped in the usual sterile fashion. Local anesthesia used: no  Anesthesia: Local anesthesia used: no  Sedation: Patient sedated: no  Patient tolerance: patient tolerated the procedure well with no immediate  complications    (including critical care time)  Medications Ordered in ED Medications  sodium chloride 0.9 % bolus 1,000 mL (has no administration in time range)  vancomycin (VANCOCIN) 1,500 mg in sodium  chloride 0.9 % 500 mL IVPB (has no administration in time range)  ceFEPIme (MAXIPIME) 2 g in sodium chloride 0.9 % 100 mL IVPB (2 g Intravenous New Bag/Given 01/30/19 1602)  vancomycin (VANCOCIN) IVPB 1000 mg/200 mL premix (has no administration in time range)  ceFEPIme (MAXIPIME) 2 g in sodium chloride 0.9 % 100 mL IVPB (has no administration in time range)  sodium chloride 0.9 % bolus 1,000 mL (1,000 mLs Intravenous New Bag/Given 01/30/19 1533)  sodium chloride 0.9 % bolus 1,000 mL (0 mLs Intravenous Stopped 01/30/19 1500)  pantoprazole (PROTONIX) 80 mg in sodium chloride 0.9 % 100 mL IVPB (0 mg Intravenous Stopped 01/30/19 1515)     Initial Impression / Assessment and Plan / ED Course  I have reviewed the triage vital signs and the nursing notes.  Pertinent labs & imaging results that were available during my care of the patient were reviewed by me and considered in my medical decision making (see chart for details).        78 year old male with a history of dementia, hypertension, hyperlipidemia, stage III squamous cell carcinoma, obstructive pneumonia, presents with concern for cough, shortness of breath, generalized weakness, nausea, vomiting, with coffee-ground emesis this morning and concern for bright red blood per rectum this a.m.  Patient hypotensive and tachycardic on arrival to the emergency department.  Differential diagnosis includes GI bleed, dehydration, sepsis, pulmonary embolus.  By history and exam, I have a lower suspicion that GI bleed is the primary etiology of his hypotension and tachycardia, and suspect there are likely other contributing factors.  Will order Protonix bolus, however given history of coffee-ground emesis.  Rectal exam with rectal pain and some bright  red blood, more consistent with rectal bleeding, and exam is less consistent with significant upper GI bleed.  He is afebrile, and given mixed picture of possible dehydration/?GI bleed did not start abx on arrival empirically.  Chest x-ray shows mass, with consolidation.  Delay in labs due to problem with the lab and subsequently difficulty with access and obtaining blood.  PIV placed by [place] by me and additional blood drawn and unfortunately later became clear that lab did not have prior samples or that they were hemolyzed.    Lactate came back at 5.6. Ordered additional fluid, vanc/cefepime and blood cx.  Blood pressures improving.  Istat labs ordered given delay in orders.  Plan to obtain CT PE study for further eval for possible PE given risk as well as eval of cancer/post-obstructive pneumonia? Pt with significant abdominal exam on exam and CT abdomen ordered as well. Signed out to Dr. Reather Converse.   Final Clinical Impressions(s) / ED Diagnoses   Final diagnoses:  Tachycardia  Lactic acidosis  Hypotension, unspecified hypotension type  Coffee ground emesis  HCAP (healthcare-associated pneumonia)    ED Discharge Orders    None       Gareth Morgan, MD 01/30/19 [phone]

## 2019-01-30 NOTE — ED Notes (Signed)
Attempts at IV start and blood draws.   Able to collect a few tubes only/   Unsuccessful IV start. Order placed for IV team consult.

## 2019-01-31 ENCOUNTER — Ambulatory Visit: Payer: Medicare HMO | Admitting: Radiation Oncology

## 2019-01-31 ENCOUNTER — Inpatient Hospital Stay (HOSPITAL_COMMUNITY): Payer: Medicare HMO

## 2019-01-31 ENCOUNTER — Encounter (HOSPITAL_COMMUNITY): Payer: Self-pay | Admitting: Primary Care

## 2019-01-31 ENCOUNTER — Ambulatory Visit: Payer: Medicare HMO | Admitting: Internal Medicine

## 2019-01-31 ENCOUNTER — Encounter: Payer: Medicare HMO | Admitting: Nutrition

## 2019-01-31 ENCOUNTER — Ambulatory Visit: Payer: Medicare HMO

## 2019-01-31 ENCOUNTER — Other Ambulatory Visit: Payer: Medicare HMO

## 2019-01-31 DIAGNOSIS — I2699 Other pulmonary embolism without acute cor pulmonale: Secondary | ICD-10-CM

## 2019-01-31 DIAGNOSIS — I361 Nonrheumatic tricuspid (valve) insufficiency: Secondary | ICD-10-CM

## 2019-01-31 DIAGNOSIS — Z515 Encounter for palliative care: Secondary | ICD-10-CM

## 2019-01-31 DIAGNOSIS — Z7189 Other specified counseling: Secondary | ICD-10-CM

## 2019-01-31 LAB — URINE CULTURE: Culture: 20000 — AB

## 2019-01-31 LAB — ECHOCARDIOGRAM COMPLETE
Height: 73 in
Weight: 2437.41 oz

## 2019-01-31 LAB — HEPARIN LEVEL (UNFRACTIONATED)
Heparin Unfractionated: 0.96 IU/mL — ABNORMAL HIGH (ref 0.30–0.70)
Heparin Unfractionated: 0.77 IU/mL — ABNORMAL HIGH (ref 0.30–0.70)
Heparin Unfractionated: 0.53 IU/mL (ref 0.30–0.70)

## 2019-01-31 LAB — CBC
HCT: 36.6 % — ABNORMAL LOW (ref 39.0–52.0)
Hemoglobin: 11.6 g/dL — ABNORMAL LOW (ref 13.0–17.0)
MCH: 20.2 pg — ABNORMAL LOW (ref 26.0–34.0)
MCHC: 31.7 g/dL (ref 30.0–36.0)
MCV: 63.9 fL — ABNORMAL LOW (ref 80.0–100.0)
Platelets: 205 10*3/uL (ref 150–400)
RBC: 5.73 MIL/uL (ref 4.22–5.81)
RDW: 17.4 % — ABNORMAL HIGH (ref 11.5–15.5)
WBC: 17.6 10*3/uL — ABNORMAL HIGH (ref 4.0–10.5)
nRBC: 0.3 % — ABNORMAL HIGH (ref 0.0–0.2)

## 2019-01-31 LAB — COMPREHENSIVE METABOLIC PANEL
ALT: 19 U/L (ref 0–44)
AST: 22 U/L (ref 15–41)
Albumin: 2.4 g/dL — ABNORMAL LOW (ref 3.5–5.0)
Alkaline Phosphatase: 72 U/L (ref 38–126)
Anion gap: 11 (ref 5–15)
BUN: 45 mg/dL — ABNORMAL HIGH (ref 8–23)
CO2: 21 mmol/L — ABNORMAL LOW (ref 22–32)
Calcium: 10.4 mg/dL — ABNORMAL HIGH (ref 8.9–10.3)
Chloride: 123 mmol/L — ABNORMAL HIGH (ref 98–111)
Creatinine, Ser: 1.59 mg/dL — ABNORMAL HIGH (ref 0.61–1.24)
GFR calc Af Amer: 48 mL/min — ABNORMAL LOW (ref >60)
GFR calc non Af Amer: 42 mL/min — ABNORMAL LOW (ref >60)
Glucose, Bld: 99 mg/dL (ref 70–99)
Potassium: 4.4 mmol/L (ref 3.5–5.1)
Sodium: 155 mmol/L — ABNORMAL HIGH (ref 135–145)
Total Bilirubin: 0.7 mg/dL (ref 0.3–1.2)
Total Protein: 6.4 g/dL — ABNORMAL LOW (ref 6.5–8.1)

## 2019-01-31 LAB — MRSA PCR SCREENING: MRSA by PCR: NEGATIVE

## 2019-01-31 MED ORDER — DEXTROSE-NACL 5-0.45 % IV SOLN
INTRAVENOUS | Status: DC
  Administered 2019-01-31 – 2019-02-01 (×2): via INTRAVENOUS

## 2019-01-31 MED ORDER — LORAZEPAM 2 MG/ML IJ SOLN
0.5000 mg | Freq: Once | INTRAMUSCULAR | Status: AC
  Administered 2019-01-31: 0.5 mg via INTRAVENOUS
  Filled 2019-01-31: qty 1

## 2019-01-31 MED ORDER — SODIUM CHLORIDE 0.9 % IV SOLN
INTRAVENOUS | Status: DC | PRN
  Administered 2019-01-31 – 2019-02-02 (×2): 500 mL via INTRAVENOUS

## 2019-01-31 MED ORDER — TAMSULOSIN HCL 0.4 MG PO CAPS
0.4000 mg | ORAL_CAPSULE | Freq: Every day | ORAL | Status: DC
  Administered 2019-02-02 – 2019-02-09 (×8): 0.4 mg via ORAL
  Filled 2019-01-31 (×10): qty 1

## 2019-01-31 MED ORDER — SODIUM CHLORIDE 0.9 % IV SOLN: INTRAVENOUS | Status: DC

## 2019-01-31 NOTE — Progress Notes (Signed)
ANTICOAGULATION CONSULT NOTE  Pharmacy Consult for heparin Indication: pulmonary embolus  Heparin Dosing Weight: 69.1 kg  Labs: Recent Labs    01/30/19 1542 01/30/19 1553 01/30/19 2131 01/31/19 0426  HGB 13.0 13.3 11.7* 11.6*  HCT 41.6 39.0 35.9* 36.6*  PLT 212  --  186 205  LABPROT 14.7  --   --   --   INR 1.2  --   --   --   HEPARINUNFRC  --   --   --  0.96*  CREATININE 1.40* 1.30*  --   --     Assessment: 78 y.o. male with PE for heparin  Goal of Therapy:  Heparin level 0.3-0.7 units/ml Monitor platelets by anticoagulation protocol: Yes   Plan:  Decrease Heparin 1100 units/hr Check heparin level in 6 hours.   Phillis Knack, PharmD, BCPS  01/31/2019 5:00 AM

## 2019-01-31 NOTE — Progress Notes (Signed)
Unable to perform lower extremity venous duplex at this time due to dementia. Becoming combative verbally and some what physical. Will not keep the covers off his leg for imaging. Wll place on hold for now and try at a later time if physician feels is still needed. [facility] 9:30 AM 10/31/2018

## 2019-01-31 NOTE — Consult Note (Signed)
Consultation Note Date: 01/31/2019   Patient Name: Richard [name]  DOB: 1940-10-29  MRN: [id number]  Age / Sex: 78 y.o., male  PCP: [name], NP Referring Physician: Antonieta Pert, MD  Reason for Consultation: Establishing goals of care and Psychosocial/spiritual support  HPI/Patient Profile: 78 y.o. 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   Clinical Assessment and Goals of Care: I have reviewed medical records including EPIC notes, labs and imaging, received report from bedside nursing staff, assessed the patient and then met at the bedside along with daughter, Devere Brem, to discuss diagnosis prognosis, [place], EOL wishes, disposition and options.  I introduced Palliative Medicine as specialized medical care for people living with serious illness. It focuses on providing relief from the symptoms and stress of a serious illness. The goal is to improve quality of life for both the patient and the family.  We discussed a brief life review of the patient.  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  He and family used to live in [place].  Mr. Ferg is divorced, he has a son who is in jail, a daughter who has passed away, and a daughter who has mental health issues.  He and [name] had been living in an apartment, working on senior housing.  Mr. Idrovo mother had 8 boys and 1 girl, 2 sets of twins.  She adopted another set of twins, younger girls, later in life.   There are only 4 brothers still living.  Mr. Schools was a twin,  his twin brother died recently with lung cancer.  There is an extensive cancer history in the family.  As far as functional and nutritional status, daughter [name] states that he has lost an incredible amount of weight since he was diagnosed with cancer in June.  She shares that she had been giving him nutritional supplements such as Ensure, but he stopped drinking them.  We talked about the chronic illness pathway related to memory loss/dementia.  We also talked about "cancer as a thief", nutrition going to feed the cancer instead of the body.  We discussed current illness and what it means in the larger context of her on-going co-morbidities.  Natural disease trajectory and expectations at EOL were discussed.  We talked in detail about changes that happen with dementia including mental status changes, physical ability changes, nutritional changes.  We talked about cancer treatment and blood clots in detail.  We reviewed the treatment plan including IV heparin (balancing treating PE and causing bleeding in other places), IV antibiotics, dextrose and IV fluid.  I attempted to elicit values and goals of care important to the patient.    The difference between aggressive medical intervention  and comfort care was considered in light of the patient's goals of care.  During our conversation [name] becomes tearful.  She tells me that she cared for her grandmother at end-of-life, stating several times that she found her grandmother dead.  She shares that Mr. Remmel's twin and their mother both had hospice care.  [name] tells me that she wants to continue to do everything we can to help Mr. Clapper get better.  We talked about time, modern medicine, Mr. Chuba body, and God's will.  Advanced directives, concepts specific to code status, artifical feeding and hydration, and rehospitalization were considered and discussed.   We discussed CODE STATUS in detail, "treat the treatable but allowing natural passing".   See CODE STATUS discussion below.   Questions and concerns were addressed.  The family was encouraged to call with questions or concerns.  PMT to follow-up tomorrow morning.  Conference with attending and bedside nursing staff related to patient condition, goals of care discussion.  [name] asked that I call Mr. Ra's brother, Nicolus Ose, at [phone] to update him.  She states that he had lots of questions about Mr. Guadamuz's condition, in particular blood in his urine.  Call to [facility], he asks about blood in the urine.  We talked about oncology/hematology, great risk for blood clots with cancer.  I share that my understanding is there brother also had blood clots with cancer, to which Eddie Dibbles agrees.  We talked about heparin treating the blood clot but also leaving Mr. Hostetter at risk for bleeding.  I shared that we are checking labs regularly, trying to find a balance.  I shared that it would likely take quite some time for Mr. Whittlesey's PE to resolve, sharing my worry that he has had a markeg decline since he was diagnosed with cancer a few months ago.  We talked about Mr. Ciulla's weight loss in the last 2 months, now hospitalized with postobstructive pneumonia, severe sepsis related to that pneumonia.  We talked about postobstructive pneumonia, likely to return unless cancer shrinks.  We talked about treatment plan in detail including IV antibiotics, dextrose and fluids, heparin drip.  Eddie Dibbles brings up Mr. Helming's history of dementia, his declines.  I share that modern medicine can prolong life, but we can also prolong the dying process and thereby prolong suffering.  I asked Eddie Dibbles to consider are we doing something TO Bari versus doing something FOR him.   We talked about CODE STATUS.  Eddie Dibbles states that he will discuss this with Aveline, but agrees that prolonging life through CPR is not the best choice for Mr. Freda Munro.  We talked about 24 to 48 hours for outcomes, modern medicine, Mr.  Foskey body, and God's will.  I encouraged Eddie Dibbles to consider what is next.  I share that even if Mr. Merlo improves, just like I will get sick again Mr. Tuman will get sick again.  I encouraged Eddie Dibbles to consider what Mr. Tessler would want this time to look like, feel like.  Eddie Dibbles seems very appropriate stating that even though it is difficult, we need to learn how to let go.  He shares that unfortunately his other brother is now in the hospital for emergency gallbladder surgery.  HCPOA   NEXT OF KIN -daughter, Matthan Sledge.  [name] tells me that Mr. Balling is divorced, he had 1 child who died, and a daughter who has mental health issues, another child (I believe a son) is in jail.  [name] leans  on her uncle Khriz Liddy for assistance in decision-making.  SUMMARY OF RECOMMENDATIONS   At this point full scope/full code Continue CODE STATUS discussions  Code Status/Advance Care Planning:  Full code -we talked about the concept of "treat the treatable, but allowing natural death".  At first, [name] agrees that she would not want chest compressions or intubation, life support, for Mr. Freda Munro.  I share that nursing staff will come and put a purple bracelet on that designates "DNR".  When I say this, [name] changes her mind.  She states that she does not want him "labeled", and shares that she is worried he would not receive care if he were a DNR.  I shared that we will continue discussions tomorrow.  Symptom Management:   Per hospitalist, no additional needs at this time.  Palliative Prophylaxis:   Oral Care and Turn Reposition  Additional Recommendations (Limitations, Scope, Preferences):  Full Scope Treatment  Psycho-social/Spiritual:   Desire for further Chaplaincy support:no  Additional Recommendations: Caregiving  Support/Resources and Education on Hospice  Prognosis:   < 3 months, would not be surprising based on declining functional status, low albumin, cancer burden,  frailty, weight loss.  Discharge Planning: To be determined, based on outcomes.      Primary Diagnoses: Present on Admission:  Severe sepsis ([place])   I have reviewed the medical record, interviewed the patient and family, and examined the patient. The following aspects are pertinent.  Past Medical History:  Diagnosis Date   Dementia ([place])    High cholesterol    Hypertension    Lung mass 12/2018   Social History   Socioeconomic History   Marital status: Divorced    Spouse name: Not on file   Number of children: Not on file   Years of education: Not on file   Highest education level: Not on file  Occupational History   Not on file  Social Needs   Financial resource strain: Not on file   Food insecurity    Worry: Not on file    Inability: Not on file   Transportation needs    Medical: Not on file    Non-medical: Not on file  Tobacco Use   Smoking status: Current Some Day Smoker    Packs/day: 1.00    Years: 60.00    Pack years: 60.00    Types: Cigarettes   Smokeless tobacco: Never Used  Substance and Sexual Activity   Alcohol use: Yes    Comment: occasional   Drug use: No   Sexual activity: Not on file  Lifestyle   Physical activity    Days per week: Not on file    Minutes per session: Not on file   Stress: Not on file  Relationships   Social connections    Talks on phone: Not on file    Gets together: Not on file    Attends religious service: Not on file    Active member of club or organization: Not on file    Attends meetings of clubs or organizations: Not on file    Relationship status: Not on file  Other Topics Concern   Not on file  Social History Narrative   Not on file   Family History  Problem Relation Age of Onset   Stomach cancer Mother    COPD Father    Lung cancer Brother    Lung cancer Brother    Scheduled Meds:  atorvastatin  20 mg Oral QPM   tamsulosin  0.4 mg  Oral QHS   Continuous Infusions:   ceFEPime (MAXIPIME) IV 2 g (01/31/19 0628)   dextrose 5 % and 0.45% NaCl 50 mL/hr at 01/31/19 1306   heparin 1,100 Units/hr (01/31/19 1308)   vancomycin     PRN Meds:.acetaminophen **OR** acetaminophen Medications Prior to Admission:  Prior to Admission medications   Medication Sig Start Date End Date Taking? Authorizing Provider  aspirin (ASPIRIN 81) 81 MG EC tablet Take 1 tablet (81 mg total) by mouth daily. 03/18/18  Yes Clent Demark, PA-C  atorvastatin (LIPITOR) 20 MG tablet Take 1 tablet (20 mg total) by mouth every evening. 12/31/18  Yes [name], NP  Omega-3 Fatty Acids (FISH OIL PO) Take 1 capsule by mouth daily.   Yes [provider]  HYDROcodone-acetaminophen (NORCO/VICODIN) 5-325 MG tablet Take 1 tablet by mouth every 4 (four) hours as needed for moderate pain. Patient not taking: Reported on 01/30/2019 12/17/18   Geradine Girt, DO  nicotine (NICODERM CQ - DOSED IN MG/24 HR) 7 mg/24hr patch Place 1 patch (7 mg total) onto the skin daily. Patient not taking: Reported on 12/31/2018 12/18/18   Geradine Girt, DO  prochlorperazine (COMPAZINE) 10 MG tablet Take 1 tablet (10 mg total) by mouth every 6 (six) hours as needed for nausea or vomiting. 01/12/19   Heilingoetter, Cassandra L, PA-C   No Known Allergies Review of Systems  Unable to perform ROS: Dementia    Physical Exam Vitals signs and nursing note reviewed.  Constitutional:      General: He is not in acute distress.    Appearance: He is ill-appearing.     Comments: Will open eyes, but not make eye contact.  Appears acutely/chronically ill and frail  HENT:     Head:     Comments: Temporal wasting Cardiovascular:     Rate and Rhythm: Normal rate.  Pulmonary:     Effort: Pulmonary effort is normal. No respiratory distress.  Abdominal:     General: Abdomen is flat. There is no distension.     Tenderness: There is no guarding.  Musculoskeletal:        General: No swelling.     Comments: Severe  muscle wasting  Skin:    General: Skin is warm and dry.  Neurological:     Comments: Known dementia  Psychiatric:     Comments: Known dementia, unable to make his needs known at this time     Vital Signs: BP 105/63 (BP Location: Right Arm)    Pulse (!) 120    Temp (!) 97.5 F (36.4 C) (Axillary)    Resp 18    Ht _0  (1.854 m)    Wt 68.1 kg    SpO2 96%    BMI 19.81 kg/m      Pain Score: 0-No pain   SpO2: SpO2: 96 % O2 Device:SpO2: 96 % O2 Flow Rate: .O2 Flow Rate (L/min): 4 L/min  IO: Intake/output summary:   Intake/Output Summary (Last 24 hours) at 01/31/2019 1428 Last data filed at 01/31/2019 6203 Gross per 24 hour  Intake 3200 ml  Output 4350 ml  Net -1150 ml    LBM:   Baseline Weight: Weight: 69.1 kg Most recent weight: Weight: 68.1 kg     Palliative Assessment/Data:   Flowsheet Rows     Most Recent Value  Intake Tab  Referral Department  Hospitalist  Unit at Time of Referral  -- [progressive]  Palliative Care Primary Diagnosis  Sepsis/Infectious Disease  Date Notified  01/31/19  Palliative Care Type  New Palliative care  Date of Admission  01/30/19  Date first seen by Palliative Care  01/31/19  # of days Palliative referral response time  0 Day(s)  # of days IP prior to Palliative referral  1  Clinical Assessment  Palliative Performance Scale Score  40%  Pain Max last 24 hours  Not able to report  Pain Min Last 24 hours  Not able to report  Dyspnea Max Last 24 Hours  Not able to report  Dyspnea Min Last 24 hours  Not able to report  Psychosocial & Spiritual Assessment  Palliative Care Outcomes      Time In: 1410 Time Out: 1530 Time Total: 80 minutes Greater than 50%  of this time was spent counseling and coordinating care related to the above assessment and plan.  Signed by: Drue Novel, NP   Please contact Palliative Medicine Team phone at [phone] for questions and concerns.  For individual provider: See Shea Evans

## 2019-01-31 NOTE — Progress Notes (Addendum)
I called pts daughter, [phone], she said she would come back to the hospital as soon as she can.  I explained that her dad is fighting [place] and will not allow [place] to run tests or care for him.

## 2019-01-31 NOTE — ED Notes (Addendum)
Please call [name] at [phone]. Daughter. With any updates relating to the patient.

## 2019-01-31 NOTE — Progress Notes (Signed)
40 mL in foley bag at [place], 300 at 2030.  Callback patient's daughter [name] at 2114. Discussed patient comfort measures, irritability but comfort offered, continued blood in urine, and ensuring patient keeps O2 and lines on and intact. [name] stated she was frustrated with prior incident when she attempted to walk outside for fresh air after discussing father's poor prognosis and unable to reenter building. Indicated she has mental health issues and needed time to regroup. Will inform charge RN and determine if adjustment to visitor policy can be made to allow for re-entry given circumstances. Informed night charge Therapist, sports, day Therapist, sports and day Agricultural consultant.  Foley bag 900 at 0054. Sangineous, however, yellow urine observed in bag when settled.   During 0000 reassessment, patient somewhat irritable with RN interaction and confused to time and place. Patient comforted and returned to rest when adjusted covers. Patient speaking of house he previously lived in, attending church on Sunday and mentioned father.  Paged NP Baltazar Najjar at [phone] to make aware patientt still has hematuria;urine visible when sangineous output settled in foley bag.Pt.resting;HR 110's, BP108/74,100% 4L [place]  Notified by charge RN patient irritable and unable to remove tourniquet applied by lab in attempt to draw labs. RN to bedside helped comfort patient and removed tourniquet. Patient refused labs.   Paged NP Baltazar Najjar at [phone] notified patient refused lab draw at approx. 0250, patient became agitated. patient calmed/comforted now resting. no labs drawn  Arrived to bedside 0434 patient irritable and agitated, appeared to be in discomfort. Reapplied O2 and observed stat lock in floor. Patient removed foley. Scant bleeding observed at site. Patient able to be comforted. Inquired if patient in pain/hurting. Patient stated "yes," when asked where patient replied "all over." RN inquired if wanted pain medicine, patient stated yes. Heparin drip paused at  0443.  Patient became restless and stated needed to urinate. Informed patient could proceed to urinate, provided towel and informed will clean up/ perform patient care upon  urinating. Inquired if urination painful, patient stated urination painful.  Site observed, scant blood on penis where foley removed. 2nd and 3rd RN assist with assessment and calming patient to assess site.   Paged NP kirby at [phone], notified pt. removed foley, balloon still inflated.Small amt. blood at penis. Heparin gtt paused. patient irritable/agitated.   Notified pharmacy at [phone]. Informed removed foley and heparin paused. Informed patient refused prior lab draw. Pharm requested callback once actions taken.  Patient more relaxed, offered pain meds, patient indicated "i'm ok, nothing wrong with me."  Reassessed foley removal site at 0514. No additional blood at site. Offered Tylenol again, patient stated "no, i'm good."  Foley 16 French reinserted at 0620 per order. Small amount frank blood at site upon reinsertion. Urine output yellow.approx 200 mL.

## 2019-01-31 NOTE — ED Triage Notes (Signed)
Daughter calle dto report PT room # on [place]

## 2019-01-31 NOTE — Progress Notes (Signed)
PROGRESS NOTE    Richard [name]  UXL:[id number] DOB: 12/12/1940 DOA: 01/30/2019 PCP: [name], NP   Brief Narrative: 78 y.o. male with stage III NSCLC started on carboplatin/paclitaxel on January 24, 2019, hypertension, hyperlipidemia, and dementia, poor historian due to dementia, usually oriented to self only was very weak and tired recently and not eating or drinking much daughter noticed that he was having difficulty breathing and coughing a lot,noticed a small amount of blood on the chair where the patient was sitting and a small amount on the floor, and on pull up. has a history of hemorrhoids.  In ER: Hypotensive, tachycardic, and tachypnic on arrival.  SPO2 in the mid to upper 80s on room air.  COVID-19 rapid test negative.  White count 18.3.  Lactic acid 5.6 >5.0.  FOBT positive.  Brown stool mixed with some bright red blood noted on rectal exam, no melena.  Hemoglobin 13.0, stable since labs done 6 days ago. UA not suggestive of infection. Blood culture x2 pending.  Chest x-ray showing stable dense consolidation and centrally obstructing mass at the right lung base.  CT angiogram showing acute PE in the left and right lower lobe pulmonary arteries and left upper lobe.  Overall clot burden is moderate.  There is also CT evidence of right heart strain (RV/LV ratio = 1.36), however, RV to LV ratio is similar to CT done 12/14/2018 at which time there was no PE.  Large right upper lobe mass with collapse of the right middle lobe, mass appears larger than comparison exam.  CT angiogram abdomen pelvis without evidence of vascular compromise or bowel ischemia.  Showing distended bladder. Patient was started on vancomycin and cefepime.  Received 3 L IV fluid boluses.  Initially started on Protonix infusion which was later stopped.  Started on heparin infusion for PE. Patient admitted for further management.  Subjective: Seen/examined Tremulous, non verbal, opens eyes to voice and mumbles  words. On 4l [place] saturating at 100%, Tachycardic in 110s. Foley+ w pinkish urine in bag  Assessment & Plan:   Severe sepsis from Postobstructive pneumonia in the setting of lung cancer.  Vancomycin/cefepime, hydration and follow-up blood culture.  Acute pulmonary embolism, provoked in the setting of lung cancer, with hypoxia in the ER and CT scan showing moderate clot burden with evidence of right heart strain: Continue heparin infusion, follow-up echocardiogram to evaluate for right heart strain. F/UA bilateral lower extremity duplex shows DVT in the right leg. monitor hemoglobin closely due to concern for possible GI bleed.  2D echo reviewed shows increased right ventricular pressure but normal size and thickness.  Acute respiratory failure with hypoxia from PE, continue supplemental oxygen to maintain saturation at least 90%.\  AKI: In the setting of poor oral intake.  Creatinine uptrending, monitor closely continue hydration. Recent Labs  Lab 01/24/19 0843 01/30/19 1542 01/30/19 1553 01/31/19 0426  BUN 18 50*  --  45*  CREATININE 0.92 1.40* 1.30* 1.59*   Urine retention: patient needed Foley placement overnight.  Monitor closely.  start Flomax.  ? GI bleed: no episode hematochezia/melena or gross active bleeding here.  Hemoglobin so far stable will monitor closely in the setting of anticoagulation for PE Recent Labs  Lab 01/24/19 0843 01/30/19 1542 01/30/19 1553 01/30/19 2131 01/31/19 0426  HGB 13.0 13.0 13.3 11.7* 11.6*  HCT 40.6 41.6 39.0 35.9* 36.6*   Hypercalcemia of malignancy, monitor calcium with hydration.  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 Followed by radiation oncology initial consult on 01/26/19 for XRT.  Being planned for return next week for CT simulation for concomitant chemotherapy/radiation therapy.  Hypernatremia : With decreased oral intake for  several days . change fluids to D5 half-normal saline.  Pinkish urine in Foley question hematuria w ? foley trauma: monitor closely while on anticoagulation.  Non-gap metabolic acidosis with AKI continue hydration. Hypertension: Blood pressure remains a stable Hyperlipidemia: Continue Lipitor  Body mass index is 20.1 kg/m.   DVT prophylaxis:Heparin Code Status: FULL Family Communication: plan of care discussed with RN.  Daughter updated on admission.  Will ipdate family.  Disposition Plan: Remains inpatient pending clinical improvement.  Prognosis appears guarded given his baseline dementia now with multiple complex co-morbidities-palliative care has been consulted.  Consultants:  Palliative care.  Procedures:  2D echocardiogram: 1. The left ventricle has normal systolic function with an ejection fraction of 60-65%. The cavity size was normal. Left ventricular diastolic Doppler parameters are indeterminate. No evidence of left ventricular regional wall motion abnormalities.  2. The right ventricle has normal systolic function. The cavity was normal. There is no increase in right ventricular wall thickness. Right ventricular systolic pressure is severely elevated (70mmHg)  3. Tricuspid valve regurgitation is moderate-severe.  4. The aorta is normal unless otherwise noted.  5. Tachycardia, sinus, 118bpm.   Microbiology:  Urine culture/Blood Culture pending.  Antimicrobials: Anti-infectives (From admission, onward)   Start     Dose/Rate Route Frequency Ordered Stop   01/31/19 1800  vancomycin (VANCOCIN) IVPB 1000 mg/200 mL premix     1,000 mg 200 mL/hr over 60 Minutes Intravenous Every 24 hours 01/30/19 1611     01/31/19 0600  ceFEPIme (MAXIPIME) 2 g in sodium chloride 0.9 % 100 mL IVPB     2 g 200 mL/hr over 30 Minutes Intravenous Every 12 hours 01/30/19 1611     01/30/19 1600  vancomycin (VANCOCIN) 1,500 mg in sodium chloride 0.9 % 500 mL IVPB     1,500 mg 250 mL/hr over  120 Minutes Intravenous  Once 01/30/19 1538 01/30/19 2038   01/30/19 1600  ceFEPIme (MAXIPIME) 2 g in sodium chloride 0.9 % 100 mL IVPB     2 g 200 mL/hr over 30 Minutes Intravenous  Once 01/30/19 1538 01/30/19 1635     Objective: Vitals:   01/31/19 0615 01/31/19 0700 01/31/19 0715 01/31/19 0730  BP: 116/75 105/66 116/81 133/76  Pulse: (!) 113 (!) 111 (!) 110 (!) 112  Resp: 16 18 17  (!) 24  Temp:      TempSrc:      SpO2: 96% 97% 97% 94%  Weight:      Height:        Intake/Output Summary (Last 24 hours) at 01/31/2019 0755 Last data filed at 01/31/2019 0439 Gross per 24 hour  Intake 3200 ml  Output 3100 ml  Net 100 ml   Filed Weights   01/30/19 1210  Weight: 69.1 kg   Weight change:   Body mass index is 20.1 kg/m.  Intake/Output from previous day: 08/16 0701 - 08/17 0700 In: 3200 [IV Piggyback:3200] Out: 3100 [Urine:3100] Intake/Output this shift: No intake/output data recorded.  Examination:  General exam: demented, nont following commands,  older for the age HEENT:PERRL,Oral mucosa moist, Ear/Nose normal on gross exam Respiratory system: Bilateral equal air entry, normal vesicular breath sounds, no wheezes or crackles  Cardiovascular system: S1 & S2 heard,No JVD, murmurs. Gastrointestinal system: Abdomen is  soft, non  tender, non distended, BS +  Nervous System:Alert., Tremulous, non verbal, opens eyes to voice and mumbles words. Extremities: No edema, no clubbing, distal peripheral pulses palpable. Skin: No rashes, lesions, no icterus MSK: Normal muscle bulk,tone ,power  Medications:  Scheduled Meds:  atorvastatin  20 mg Oral QPM   Continuous Infusions:  sodium chloride 125 mL/hr at 01/30/19 2218   ceFEPime (MAXIPIME) IV 2 g (01/31/19 6256)   heparin 1,100 Units/hr (01/31/19 0504)   vancomycin      Data Reviewed: I have personally reviewed following labs and imaging studies  CBC: Recent Labs  Lab 01/24/19 0843 01/30/19 1542 01/30/19 1553  01/30/19 2131 01/31/19 0426  WBC 17.3* 18.3*  --  15.5* 17.6*  NEUTROABS 12.1* 14.0*  --   --   --   HGB 13.0 13.0 13.3 11.7* 11.6*  HCT 40.6 41.6 39.0 35.9* 36.6*  MCV 62.3* 65.4*  --  62.7* 63.9*  PLT 595* 212  --  186 389   Basic Metabolic Panel: Recent Labs  Lab 01/24/19 0843 01/30/19 1542 01/30/19 1553 01/31/19 0426  NA 143 147* 147* 155*  K 3.9 4.5 4.2 4.4  CL 107 110  --  123*  CO2 22 19*  --  21*  GLUCOSE 159* 114*  --  99  BUN 18 50*  --  45*  CREATININE 0.92 1.40* 1.30* 1.59*  CALCIUM 10.9* 11.5*  --  10.4*   GFR: Estimated Creatinine Clearance: 38.6 mL/min (A) (by C-G formula based on SCr of 1.59 mg/dL (H)). Liver Function Tests: Recent Labs  Lab 01/24/19 0843 01/30/19 1542 01/31/19 0426  AST 26 21 22   ALT 23 19 19   ALKPHOS 105 81 72  BILITOT 0.4 0.9 0.7  PROT 8.7* 7.0 6.4*  ALBUMIN 2.5* 2.5* 2.4*   No results for input(s): LIPASE, AMYLASE in the last 168 hours. No results for input(s): AMMONIA in the last 168 hours. Coagulation Profile: Recent Labs  Lab 01/30/19 1542  INR 1.2   Cardiac Enzymes: No results for input(s): CKTOTAL, CKMB, CKMBINDEX, TROPONINI in the last 168 hours. BNP (last 3 results) No results for input(s): PROBNP in the last 8760 hours. HbA1C: No results for input(s): HGBA1C in the last 72 hours. CBG: No results for input(s): GLUCAP in the last 168 hours. Lipid Profile: No results for input(s): CHOL, HDL, LDLCALC, TRIG, CHOLHDL, LDLDIRECT in the last 72 hours. Thyroid Function Tests: No results for input(s): TSH, T4TOTAL, FREET4, T3FREE, THYROIDAB in the last 72 hours. Anemia Panel: No results for input(s): VITAMINB12, FOLATE, FERRITIN, TIBC, IRON, RETICCTPCT in the last 72 hours. Sepsis Labs: Recent Labs  Lab 01/30/19 1414 01/30/19 1542 01/30/19 2131  LATICACIDVEN 5.6* 5.0* 2.9*    Recent Results (from the past 240 hour(s))  SARS Coronavirus 2 [facility] order, Performed in [facility] hospital lab) Nasopharyngeal  Nasopharyngeal Swab     Status: None   Collection Time: 01/30/19  1:17 PM   Specimen: Nasopharyngeal Swab  Result Value Ref Range Status   SARS Coronavirus 2 NEGATIVE NEGATIVE Final    Comment: (NOTE) If result is NEGATIVE SARS-CoV-2 target nucleic acids are NOT DETECTED. The SARS-CoV-2 RNA is generally detectable in upper and lower  respiratory specimens during the acute phase of infection. The lowest  concentration of SARS-CoV-2 viral copies this assay can detect is 250  copies / mL. A negative result does not preclude SARS-CoV-2 infection  and should not be used as the sole basis for treatment or other  patient management decisions.  A  negative result may occur with  improper specimen collection / handling, submission of specimen other  than nasopharyngeal swab, presence of viral mutation(s) within the  areas targeted by this assay, and inadequate number of viral copies  (<250 copies / mL). A negative result must be combined with clinical  observations, patient history, and epidemiological information. If result is POSITIVE SARS-CoV-2 target nucleic acids are DETECTED. The SARS-CoV-2 RNA is generally detectable in upper and lower  respiratory specimens dur ing the acute phase of infection.  Positive  results are indicative of active infection with SARS-CoV-2.  Clinical  correlation with patient history and other diagnostic information is  necessary to determine patient infection status.  Positive results do  not rule out bacterial infection or co-infection with other viruses. If result is PRESUMPTIVE POSTIVE SARS-CoV-2 nucleic acids MAY BE PRESENT.   A presumptive positive result was obtained on the submitted specimen  and confirmed on repeat testing.  While 2019 novel coronavirus  (SARS-CoV-2) nucleic acids may be present in the submitted sample  additional confirmatory testing may be necessary for epidemiological  and / or clinical management purposes  to differentiate between   SARS-CoV-2 and other Sarbecovirus currently known to infect humans.  If clinically indicated additional testing with an alternate test  methodology [phone]) is advised. The SARS-CoV-2 RNA is generally  detectable in upper and lower respiratory sp ecimens during the acute  phase of infection. The expected result is Negative. Fact Sheet for Patients:  StrictlyIdeas.no Fact Sheet for Healthcare Providers: BankingDealers.co.za This test is not yet approved or cleared by the [place] FDA and has been authorized for detection and/or diagnosis of SARS-CoV-2 by FDA under an Emergency Use Authorization (EUA).  This EUA will remain in effect (meaning this test can be used) for the duration of the COVID-19 declaration under Section 564(b)(1) of the Act, 21 U.S.C. section 360bbb-3(b)(1), unless the authorization is terminated or revoked sooner. Performed at [facility], [place] [address] [place], [place] [address]   Blood culture (routine x 2)     Status: None (Preliminary result)   Collection Time: 01/30/19  3:59 PM   Specimen: BLOOD  Result Value Ref Range Status   Specimen Description BLOOD RIGHT ANTECUBITAL  Final   Special Requests   Final    BOTTLES DRAWN AEROBIC AND ANAEROBIC Blood Culture results may not be optimal due to an inadequate volume of blood received in culture bottles   Culture   Final    NO GROWTH < 24 HOURS Performed at [facility], [place] [address], [place], [place] [address]    Report Status PENDING  Incomplete  Blood culture (routine x 2)     Status: None (Preliminary result)   Collection Time: 01/30/19  4:04 PM   Specimen: BLOOD  Result Value Ref Range Status   Specimen Description BLOOD LEFT ANTECUBITAL  Final   Special Requests   Final    BOTTLES DRAWN AEROBIC AND ANAEROBIC Blood Culture results may not be optimal due to an inadequate volume of blood received in culture bottles   Culture   Final     NO GROWTH < 24 HOURS Performed at [facility], [place] [address], [place], [place] [address]    Report Status PENDING  Incomplete      Radiology Studies: Ct Angio Chest Pe W And/or Wo Contrast  Result Date: 01/30/2019 CLINICAL DATA:  Initial lung cancer. Concern for pulmonary embolism or ischemia bowel. EXAM: CT ANGIOGRAPHY CHEST, ABDOMEN  AND PELVIS TECHNIQUE: Multidetector CT imaging through the chest, abdomen and pelvis was performed using the standard protocol during bolus administration of intravenous contrast. Multiplanar reconstructed images and MIPs were obtained and reviewed to evaluate the vascular anatomy. CONTRAST:  157mL OMNIPAQUE IOHEXOL 350 MG/ML SOLN COMPARISON:  CT 12/14/2018 FINDINGS: CTA CHEST FINDINGS Cardiovascular: Filling defect within the RIGHT lower lobe pulmonary artery (image 102/5) Filling defect within the LEFT lobe pulmonary arteries (image 97/5). Filling defect within the LEFT upper lobe pulmonary artery. Overall clot burden is moderate. The RIGHT ventricle ratio to LEFT ventricle ratio is greater than 1 (1.36). Of note this ratio is similar to CT of 12/14/2019 when there was no pulmonary emboli. Mediastinum/Nodes: No axillary supraclavicular adenopathy. Perihilar thickening in the RIGHT hilum similar prior. Lungs/Pleura: There is collapse of the RIGHT middle lobe. Mass in the RIGHT upper lobe seen on comparison exam which not well appreciated but measures potentially 4.7 cm on image 87/5 increased from 3.8 cm on prior. Musculoskeletal: No aggressive osseous lesion. Review of the MIP images confirms the above findings. CTA ABDOMEN AND PELVIS FINDINGS VASCULAR Aorta: No dissection or aneurysm. Celiac: Widely patent. SMA: Widely patent. Renals: Single renal artery on the LEFT and 2 renal arteries on the RIGHT all widely patent. IMA: Widely patent Inflow: Normal interval calcifications of the iliac arteries. Veins: No acute finding Review of the MIP images confirms the above  findings. NON-VASCULAR Hepatobiliary: No focal hepatic lesion. Pancreas: No pancreatic inflammation. Spleen: Normal. Adrenals/Urinary Tract: adrenal glands and kidneys normal. Bladder is distended to level the umbilicus. Stomach/Bowel: Small hiatal hernia. The stomach duodenum normal. No evidence of bowel ischemia no bowel dilatation appendix normal. The colon and rectosigmoid colon are normal. Lymphatic: No abdominopelvic lymphadenopathy Reproductive: Prostate nodular dense the base the bladder. Other: Evaluation of the pelvic floor is limited by the LEFT hip prosthetic Musculoskeletal: No aggressive osseous lesion Review of the MIP images confirms the above findings. IMPRESSION: Chest Impression: 1. Acute pulmonary embolism in the LEFT and RIGHT lower lobe pulmonary arteries and LEFT upper lobe. Overall clot burden is moderate. 2. Positive for acute PE with CT evidence of right heart strain (RV/LV Ratio = 1.36). This ratio suggests RIGHT heart strain; however, the ratio of RIGHT ventricle to LEFT ventricle diameter is similar to CT of 12/14/2018 at which time there was no PE. 3. Large RIGHT upper lobe mass with collapse of the RIGHT middle lobe. Mass is difficult to define but appears larger than comparison exam Abdomen / Pelvis Impression: 1. No acute findings abdominal aorta. 2. No evidence of vascular compromise in the abdomen pelvis. 3. No evidence of bowel ischemia. 4. The bladder is distended.  Recommend decompression. Critical Value/emergent results were called by telephone at the time of interpretation on 01/30/2019 at 7:32 pm to Dr. ; Elnora Morrison , who verbally acknowledged these results. Electronically Signed   By: Suzy Bouchard M.D.   On: 01/30/2019 19:33   US Renal  Result Date: 01/30/2019 CLINICAL DATA:  Acute kidney injury. EXAM: RENAL / URINARY TRACT ULTRASOUND COMPLETE COMPARISON:  CT angiography earlier this day. FINDINGS: Right Kidney: Renal measurements: 11.0 x 4.3 x 6.0 cm = volume: 146  mL. Echogenicity within normal limits. No mass or hydronephrosis visualized. Left Kidney: Renal measurements: 10.7 x 6.6 x 5.0 cm = volume: 182 mL. Echogenicity within normal limits. No mass or hydronephrosis visualized. Bladder: Prominently distended, as seen on CT. No definite wall thickening. Soft tissue nodule at the bladder base measures 2.0 x 1.7  x 2.3 cm. IMPRESSION: 1. Normal sonographic appearance of the kidneys. 2. Marked urinary bladder distention is seen on CT. 3. Soft tissue nodule at the bladder base measuring 2.3 cm, on CT this appears contiguous with the prostate gland, however this communication is not well delineated sonographically. Electronically Signed   By: Keith Rake M.D.   On: 01/30/2019 23:18   Dg Chest Portable 1 View  Result Date: 01/30/2019 CLINICAL DATA:  Shortness of breath dry cough. Increased weakness. Decreased appetite and intake. History of lung cancer. EXAM: PORTABLE CHEST 1 VIEW COMPARISON:  12/16/2018 and chest CT dated 12/14/2018. FINDINGS: Stable dense consolidation at the right lung base with a centrally obstructing mass better seen on the previous CT. Clear left lung. Normal sized heart. Minimal bilateral AC joint degenerative changes. IMPRESSION: Stable dense consolidation and centrally obstructing mass at the right lung base. Electronically Signed   By: Claudie Revering M.D.   On: 01/30/2019 13:20   Ct Angio Abd/pel W And/or Wo Contrast  Result Date: 01/30/2019 CLINICAL DATA:  Initial lung cancer. Concern for pulmonary embolism or ischemia bowel. EXAM: CT ANGIOGRAPHY CHEST, ABDOMEN AND PELVIS TECHNIQUE: Multidetector CT imaging through the chest, abdomen and pelvis was performed using the standard protocol during bolus administration of intravenous contrast. Multiplanar reconstructed images and MIPs were obtained and reviewed to evaluate the vascular anatomy. CONTRAST:  128mL OMNIPAQUE IOHEXOL 350 MG/ML SOLN COMPARISON:  CT 12/14/2018 FINDINGS: CTA CHEST FINDINGS  Cardiovascular: Filling defect within the RIGHT lower lobe pulmonary artery (image 102/5) Filling defect within the LEFT lobe pulmonary arteries (image 97/5). Filling defect within the LEFT upper lobe pulmonary artery. Overall clot burden is moderate. The RIGHT ventricle ratio to LEFT ventricle ratio is greater than 1 (1.36). Of note this ratio is similar to CT of 12/14/2019 when there was no pulmonary emboli. Mediastinum/Nodes: No axillary supraclavicular adenopathy. Perihilar thickening in the RIGHT hilum similar prior. Lungs/Pleura: There is collapse of the RIGHT middle lobe. Mass in the RIGHT upper lobe seen on comparison exam which not well appreciated but measures potentially 4.7 cm on image 87/5 increased from 3.8 cm on prior. Musculoskeletal: No aggressive osseous lesion. Review of the MIP images confirms the above findings. CTA ABDOMEN AND PELVIS FINDINGS VASCULAR Aorta: No dissection or aneurysm. Celiac: Widely patent. SMA: Widely patent. Renals: Single renal artery on the LEFT and 2 renal arteries on the RIGHT all widely patent. IMA: Widely patent Inflow: Normal interval calcifications of the iliac arteries. Veins: No acute finding Review of the MIP images confirms the above findings. NON-VASCULAR Hepatobiliary: No focal hepatic lesion. Pancreas: No pancreatic inflammation. Spleen: Normal. Adrenals/Urinary Tract: adrenal glands and kidneys normal. Bladder is distended to level the umbilicus. Stomach/Bowel: Small hiatal hernia. The stomach duodenum normal. No evidence of bowel ischemia no bowel dilatation appendix normal. The colon and rectosigmoid colon are normal. Lymphatic: No abdominopelvic lymphadenopathy Reproductive: Prostate nodular dense the base the bladder. Other: Evaluation of the pelvic floor is limited by the LEFT hip prosthetic Musculoskeletal: No aggressive osseous lesion Review of the MIP images confirms the above findings. IMPRESSION: Chest Impression: 1. Acute pulmonary embolism in the  LEFT and RIGHT lower lobe pulmonary arteries and LEFT upper lobe. Overall clot burden is moderate. 2. Positive for acute PE with CT evidence of right heart strain (RV/LV Ratio = 1.36). This ratio suggests RIGHT heart strain; however, the ratio of RIGHT ventricle to LEFT ventricle diameter is similar to CT of 12/14/2018 at which time there was no PE. 3. Large RIGHT  upper lobe mass with collapse of the RIGHT middle lobe. Mass is difficult to define but appears larger than comparison exam Abdomen / Pelvis Impression: 1. No acute findings abdominal aorta. 2. No evidence of vascular compromise in the abdomen pelvis. 3. No evidence of bowel ischemia. 4. The bladder is distended.  Recommend decompression. Critical Value/emergent results were called by telephone at the time of interpretation on 01/30/2019 at 7:32 pm to Dr. ; Elnora Morrison , who verbally acknowledged these results. Electronically Signed   By: Suzy Bouchard M.D.   On: 01/30/2019 19:33      LOS: 1 day   Time spent: More than 50% of that time was spent in counseling and/or coordination of care.  Antonieta Pert, MD [facility]  01/31/2019, 7:55 AM

## 2019-01-31 NOTE — Progress Notes (Signed)
Bilateral lower extremity venous duplex has been completed. Preliminary results can be found in CV Proc through chart review.  Results were given to the patient's nurse, Margarita Grizzle.  01/31/19 11:34 AM Richard [name] RVT

## 2019-01-31 NOTE — ED Notes (Signed)
Admitting provider notified that foley placement was smooth with no resistance encountered.

## 2019-01-31 NOTE — ED Notes (Signed)
Pharmacy Marya Amsler) notified this RN that pt's heparin level was slightly elevated. Ordered to titrate heparin to 11,000 units per hour.

## 2019-01-31 NOTE — Progress Notes (Signed)
Echocardiogram 2D Echocardiogram has been performed.  Oneal Deputy Tremel Setters 01/31/2019, 8:30 AM

## 2019-01-31 NOTE — Progress Notes (Signed)
ANTICOAGULATION CONSULT NOTE - Follow Up Consult  Pharmacy Consult for Heparin Indication: pulmonary embolus/DVT  No Known Allergies  Patient Measurements: Height: 6\' 1"  (185.4 cm) Weight: 150 lb 2.1 oz (68.1 kg) IBW/kg (Calculated) : 79.9 Heparin Dosing Weight: 68.1 kg  Vital Signs: Temp: 97.9 F (36.6 C) (08/17 2021) Temp Source: Axillary (08/17 2021) BP: 107/68 (08/17 2021) Pulse Rate: 118 (08/17 2242)  Labs: Recent Labs    01/30/19 1542 01/30/19 1553 01/30/19 2131 01/31/19 0426 01/31/19 1125 01/31/19 2200  HGB 13.0 13.3 11.7* 11.6*  --   --   HCT 41.6 39.0 35.9* 36.6*  --   --   PLT 212  --  186 205  --   --   LABPROT 14.7  --   --   --   --   --   INR 1.2  --   --   --   --   --   HEPARINUNFRC  --   --   --  0.96* 0.77* 0.53  CREATININE 1.40* 1.30*  --  1.59*  --   --     Estimated Creatinine Clearance: 38.1 mL/min (A) (by C-G formula based on SCr of 1.59 mg/dL (H)).   Medications:  Medications Prior to Admission  Medication Sig Dispense Refill Last Dose  . aspirin (ASPIRIN 81) 81 MG EC tablet Take 1 tablet (81 mg total) by mouth daily. 90 tablet 3 01/29/2019 at Unknown time  . atorvastatin (LIPITOR) 20 MG tablet Take 1 tablet (20 mg total) by mouth every evening. 90 tablet 1 01/29/2019 at Unknown time  . Omega-3 Fatty Acids (FISH OIL PO) Take 1 capsule by mouth daily.   01/29/2019 at Unknown time  . HYDROcodone-acetaminophen (NORCO/VICODIN) 5-325 MG tablet Take 1 tablet by mouth every 4 (four) hours as needed for moderate pain. (Patient not taking: Reported on 01/30/2019) 12 tablet 0 Not Taking at Unknown time  . nicotine (NICODERM CQ - DOSED IN MG/24 HR) 7 mg/24hr patch Place 1 patch (7 mg total) onto the skin daily. (Patient not taking: Reported on 12/31/2018) 28 patch 0   . prochlorperazine (COMPAZINE) 10 MG tablet Take 1 tablet (10 mg total) by mouth every 6 (six) hours as needed for nausea or vomiting. 30 tablet 0     Assessment: 71 yom presenting with acute  bilateral PE, moderate clot burden with RHS. Pharmacy consulted to dose heparin. Not on anticoagulation PTA. CBC wnl. No active bleed issues documented - did report coffee-ground emesis and concern for BRBPR on 8/16 AM. Initially given Protonix bolus but not continued as EDP has low suspicion for GIB. Heparin level down to 0.77 after heparin rate decreased to 1100 units/hr.  HL is supratherapeutic.  RN reportded some hematuria, MD aware.   Venous duplex bilateral LE done 01/31/19 :  Positive for RLE DVT  8/17 PM update: heparin level therapeutic x 1 after rate decrease  Goal of Therapy:  Heparin level 0.3-0.7 units/ml Monitor platelets by anticoagulation protocol: Yes   Plan:  Cont heparin at 1000 units/hr Confirmatory heparin level with AM labs  Narda Bonds, PharmD, [place] Pharmacist Phone: [phone]

## 2019-01-31 NOTE — ED Notes (Signed)
Initial urine output noted to be amber in color. Patients urine now hematuric in appearance with bright red color noted. Admitting provider notified.

## 2019-01-31 NOTE — ED Notes (Signed)
Admitting provider notified of bladder scan value of >1000 and foley placement. Order for foley placed prior to placement by this RN.

## 2019-01-31 NOTE — Progress Notes (Signed)
ANTICOAGULATION CONSULT NOTE - Follow Up Consult  Pharmacy Consult for Heparin Indication: pulmonary embolus ,  + DVT RLE No Known Allergies  Patient Measurements: Height: 6\' 1"  (185.4 cm) Weight: 150 lb 2.1 oz (68.1 kg) IBW/kg (Calculated) : 79.9 Heparin Dosing Weight: 68.1 kg  Vital Signs: Temp: 97.5 F (36.4 C) (08/17 1215) Temp Source: Axillary (08/17 1215) BP: 105/63 (08/17 1215) Pulse Rate: 120 (08/17 1215)  Labs: Recent Labs    01/30/19 1542 01/30/19 1553 01/30/19 2131 01/31/19 0426 01/31/19 1125  HGB 13.0 13.3 11.7* 11.6*  --   HCT 41.6 39.0 35.9* 36.6*  --   PLT 212  --  186 205  --   LABPROT 14.7  --   --   --   --   INR 1.2  --   --   --   --   HEPARINUNFRC  --   --   --  0.96* 0.77*  CREATININE 1.40* 1.30*  --  1.59*  --     Estimated Creatinine Clearance: 38.1 mL/min (A) (by C-G formula based on SCr of 1.59 mg/dL (H)).   Medications:  Medications Prior to Admission  Medication Sig Dispense Refill Last Dose  . aspirin (ASPIRIN 81) 81 MG EC tablet Take 1 tablet (81 mg total) by mouth daily. 90 tablet 3 01/29/2019 at Unknown time  . atorvastatin (LIPITOR) 20 MG tablet Take 1 tablet (20 mg total) by mouth every evening. 90 tablet 1 01/29/2019 at Unknown time  . Omega-3 Fatty Acids (FISH OIL PO) Take 1 capsule by mouth daily.   01/29/2019 at Unknown time  . HYDROcodone-acetaminophen (NORCO/VICODIN) 5-325 MG tablet Take 1 tablet by mouth every 4 (four) hours as needed for moderate pain. (Patient not taking: Reported on 01/30/2019) 12 tablet 0 Not Taking at Unknown time  . nicotine (NICODERM CQ - DOSED IN MG/24 HR) 7 mg/24hr patch Place 1 patch (7 mg total) onto the skin daily. (Patient not taking: Reported on 12/31/2018) 28 patch 0   . prochlorperazine (COMPAZINE) 10 MG tablet Take 1 tablet (10 mg total) by mouth every 6 (six) hours as needed for nausea or vomiting. 30 tablet 0     Assessment: 80 yom presenting with acute bilateral PE, moderate clot burden with  RHS. Pharmacy consulted to dose heparin. Not on anticoagulation PTA. CBC wnl. No active bleed issues documented - did report coffee-ground emesis and concern for BRBPR on 8/16 AM. Initially given Protonix bolus but not continued as EDP has low suspicion for GIB. Heparin level down to 0.77 after heparin rate decreased to 1100 units/hr.  HL is supratherapeutic.  RN reportded some hematuria, MD aware.   Venous duplex bilateral LE done 01/31/19 :  Positive for RLE DVT   Goal of Therapy:  Heparin level 0.3-0.7 units/ml Monitor platelets by anticoagulation protocol: Yes   Plan:  Decrease heparin drip rate to 1000 units/hr Check a 6 hour heparin level Daily heparin level/CBC Monitor closely for s/sx bleeding Follow up for transition to oral anticoagulation   Thank you for allowing pharmacy to be part of this patients care team.  Nicole Cella, [place] Pharmacist [phone] Please check AMION for all [place] phone numbers After 10:00 PM, call [place] [phone] 01/31/2019,1:03 PM

## 2019-01-31 NOTE — Progress Notes (Signed)
Richard [name] [id number] Admission Data: 01/31/2019 9:10 AM Attending Provider: Antonieta Pert, MD  BJS:[id number], [name], NP Consults/ Treatment Team:   Richard [name] is a 78 y.o. male patient admitted from ED awake, alert  & orientated  X 3,  Full Code, VSS - Blood pressure 133/76, pulse (!) 112, temperature (!) 97.4 F (36.3 C), temperature source Axillary, resp. rate (!) 24, height 6\' 1"  (1.854 m), weight 68.1 kg, SpO2 94 %., O2   43 L nasal cannular, no c/o shortness of breath, no c/o chest pain, no distress noted. Progressive monitor M10 placed and pt is currently running:sinus tachycardia.   IV site WDL:  antecubital left, condition patent with a transparent dsg that's clean dry and intact.  Allergies:  No Known Allergies   Past Medical History:  Diagnosis Date  . Dementia ([place])   . High cholesterol   . Hypertension   . Lung mass 12/2018    History:  obtained from chart review. Tobacco/alcohol: unknown tobacco use history unreliable  Pt orientation to unit, room and routine. Information packet given to patient/family and safety video watched.  Admission INP armband ID verified with patient/family, and in place. SR up x 2, fall risk assessment complete with Patient and family verbalizing understanding of risks associated with falls. Pt verbalizes an understanding of how to use the call bell and to call for help before getting out of bed.  Skin, clean-dry- intact without evidence of bruising, or skin tears.   No evidence of skin break down noted on exam. no wounds    Will cont to monitor and assist as needed.  [name], RN 01/31/2019 9:10 AM

## 2019-01-31 NOTE — Progress Notes (Addendum)
Notified Dr. Maren Beach, through [facility], that pt is + for DVT in right  Leg.  Dr. Maren Beach know's pt's vitals signs are abnormal.  Pt has been tachycardic, with low 02, requiring oxygen, with low bp his entire stay. Pt received 4 L of NS in the ED, was placed on 3-4 of [place] Oxygen prior to coming to our floor.  Pt's lungs are full of blood clots and masses, it is not expected that his vitals signs will return to normal limits during this hospital stay.  Palliative consult has been ordered.

## 2019-01-31 NOTE — Evaluation (Signed)
Clinical/Bedside Swallow Evaluation Patient Details  Name: Richard [name] MRN: [id number] Date of Birth: [birth year]  Today's Date: 01/31/2019 Time: SLP Start Time (ACUTE ONLY): 1050 SLP Stop Time (ACUTE ONLY): 1110 SLP Time Calculation (min) (ACUTE ONLY): 20 min  Past Medical History:  Past Medical History:  Diagnosis Date  . Dementia ([place])   . High cholesterol   . Hypertension   . Lung mass 12/2018   Past Surgical History:  Past Surgical History:  Procedure Laterality Date  . JOINT REPLACEMENT    . VIDEO BRONCHOSCOPY WITH ENDOBRONCHIAL ULTRASOUND N/A 12/16/2018   Procedure: VIDEO BRONCHOSCOPY WITH ENDOBRONCHIAL ULTRASOUND AND FLUROSCOPY;  Surgeon: Richard Garfinkel, MD;  Location: [facility];  Service: Pulmonary;  Laterality: N/A;   HPI:  78 y.o. 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 Daughter reports having to feed her father recently at home.  RN reports coughing and regurgitation of breakfast, hence SLP swallow eval ordered.    Assessment / Plan / Recommendation Clinical Impression  Daughter was present for clinical swallowing assessment, and she fed her father in order to enhance his willingness to participate (he had been declining procedures this morning and was becoming somewhat combative).  With his daughter at bedside, he willingly consumed a container of applesauce and drank several sips of water.  Oral mechanism exam was normal.  He required prompts to attend to POs and to seal lips around spoon/straw.  There were no overt s/s of aspiration.  There was notable belching during PO intake.  RR reached mid thirties, which can impact synchrony of swallow with  respiration, but no obvious negative consequences were observed today.  Recommend downgrading diet for now to a dysphagia 1, thin liquids; give meds whole in puree.  SLP will follow for safety and diet progression.  D/W daughter.  SLP Visit Diagnosis: Dysphagia, unspecified (R13.10)    Aspiration Risk       Diet Recommendation   dysphagia 1, thin liquids  Medication Administration: Whole meds with puree    Other  Recommendations Oral Care Recommendations: Oral care BID   Follow up Recommendations Other (comment)(tba)      Frequency and Duration min 2x/week  1 week       Prognosis Prognosis for Safe Diet Advancement: Good      Swallow Study   General Date of Onset: 01/30/19 HPI: 78 y.o. 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 Daughter reports having to feed her father recently at home.  RN reports coughing and regurgitation of breakfast, hence SLP swallow eval ordered.  Type of Study: Bedside Swallow Evaluation Previous Swallow Assessment: none per records Diet Prior to this Study: Regular;Thin liquids Temperature Spikes Noted: No Respiratory Status: Nasal cannula(4 liters) History of Recent Intubation: No Behavior/Cognition: Alert Oral Cavity Assessment: Within Functional Limits Oral Care Completed by SLP: No Oral Cavity - Dentition: Edentulous Self-Feeding Abilities: Needs assist Patient Positioning: Partially reclined Baseline Vocal Quality: Normal Volitional Cough: Strong Volitional Swallow: Unable to elicit  Oral/Motor/Sensory Function Overall Oral Motor/Sensory Function: Other (comment)(symmetric at baseline)   Ice Chips Ice chips: Not tested    Thin Liquid Thin Liquid: Within functional limits Presentation: Straw    Nectar Thick Nectar Thick Liquid: Not tested   Honey Thick Honey Thick Liquid: Not tested   Puree Puree: Within functional limits   Solid     Solid: Not tested      Richard [name] 01/31/2019,11:25 AM    Richard [name], [place] Office number [phone] Pager [phone]

## 2019-02-01 ENCOUNTER — Other Ambulatory Visit: Payer: Self-pay

## 2019-02-01 ENCOUNTER — Inpatient Hospital Stay (HOSPITAL_COMMUNITY): Payer: Medicare HMO

## 2019-02-01 DIAGNOSIS — R6520 Severe sepsis without septic shock: Secondary | ICD-10-CM

## 2019-02-01 DIAGNOSIS — N179 Acute kidney failure, unspecified: Secondary | ICD-10-CM

## 2019-02-01 DIAGNOSIS — Z515 Encounter for palliative care: Secondary | ICD-10-CM

## 2019-02-01 DIAGNOSIS — I480 Paroxysmal atrial fibrillation: Secondary | ICD-10-CM

## 2019-02-01 DIAGNOSIS — Z7189 Other specified counseling: Secondary | ICD-10-CM

## 2019-02-01 DIAGNOSIS — I2601 Septic pulmonary embolism with acute cor pulmonale: Secondary | ICD-10-CM

## 2019-02-01 DIAGNOSIS — A419 Sepsis, unspecified organism: Principal | ICD-10-CM

## 2019-02-01 LAB — BASIC METABOLIC PANEL
Anion gap: 11 (ref 5–15)
BUN: 20 mg/dL (ref 8–23)
CO2: 19 mmol/L — ABNORMAL LOW (ref 22–32)
Calcium: 9.3 mg/dL (ref 8.9–10.3)
Chloride: 124 mmol/L — ABNORMAL HIGH (ref 98–111)
Creatinine, Ser: 1.09 mg/dL (ref 0.61–1.24)
GFR calc Af Amer: >60 mL/min (ref >60)
GFR calc non Af Amer: >60 mL/min (ref >60)
Glucose, Bld: 136 mg/dL — ABNORMAL HIGH (ref 70–99)
Potassium: 2.9 mmol/L — ABNORMAL LOW (ref 3.5–5.1)
Sodium: 154 mmol/L — ABNORMAL HIGH (ref 135–145)

## 2019-02-01 LAB — CBC
HCT: 33.7 % — ABNORMAL LOW (ref 39.0–52.0)
Hemoglobin: 10.5 g/dL — ABNORMAL LOW (ref 13.0–17.0)
MCH: 20.3 pg — ABNORMAL LOW (ref 26.0–34.0)
MCHC: 31.2 g/dL (ref 30.0–36.0)
MCV: 65.2 fL — ABNORMAL LOW (ref 80.0–100.0)
Platelets: 185 10*3/uL (ref 150–400)
RBC: 5.17 MIL/uL (ref 4.22–5.81)
RDW: 17.5 % — ABNORMAL HIGH (ref 11.5–15.5)
WBC: 14.2 10*3/uL — ABNORMAL HIGH (ref 4.0–10.5)
nRBC: 1.8 % — ABNORMAL HIGH (ref 0.0–0.2)

## 2019-02-01 LAB — LACTIC ACID, PLASMA: Lactic Acid, Venous: 3 mmol/L (ref 0.5–1.9)

## 2019-02-01 LAB — HEPARIN LEVEL (UNFRACTIONATED): Heparin Unfractionated: 0.29 IU/mL — ABNORMAL LOW (ref 0.30–0.70)

## 2019-02-01 MED ORDER — LACTATED RINGERS IV BOLUS
500.0000 mL | Freq: Once | INTRAVENOUS | Status: AC
  Administered 2019-02-01: 500 mL via INTRAVENOUS

## 2019-02-01 MED ORDER — POTASSIUM CHLORIDE 20 MEQ/15ML (10%) PO SOLN
40.0000 meq | Freq: Once | ORAL | Status: AC
  Administered 2019-02-01: 40 meq via ORAL
  Filled 2019-02-01: qty 30

## 2019-02-01 MED ORDER — HALOPERIDOL LACTATE 5 MG/ML IJ SOLN
1.0000 mg | Freq: Once | INTRAMUSCULAR | Status: AC
  Administered 2019-02-01: 1 mg via INTRAVENOUS
  Filled 2019-02-01: qty 1

## 2019-02-01 MED ORDER — METOPROLOL TARTRATE 5 MG/5ML IV SOLN: 2.5000 mg | Freq: Four times a day (QID) | INTRAVENOUS | Status: DC | PRN

## 2019-02-01 MED ORDER — DIGOXIN 0.1 MG/ML IJ SOLN
0.2500 mg | Freq: Once | INTRAMUSCULAR | Status: DC
  Filled 2019-02-01: qty 3

## 2019-02-01 MED ORDER — DIGOXIN 0.1 MG/ML IJ SOLN
0.5000 mg | Freq: Once | INTRAMUSCULAR | Status: DC
  Filled 2019-02-01: qty 5

## 2019-02-01 MED ORDER — DIGOXIN 125 MCG PO TABS: 0.1250 mg | ORAL_TABLET | Freq: Every day | ORAL | Status: DC

## 2019-02-01 MED ORDER — SODIUM CHLORIDE 0.9 % IV BOLUS
1000.0000 mL | Freq: Once | INTRAVENOUS | Status: AC
  Administered 2019-02-01: 1000 mL via INTRAVENOUS

## 2019-02-01 MED ORDER — DEXTROSE 5 % IV SOLN
INTRAVENOUS | Status: DC
  Administered 2019-02-02: 02:00:00 via INTRAVENOUS

## 2019-02-01 MED ORDER — DIGOXIN 0.25 MG/ML IJ SOLN: 0.2500 mg | Freq: Once | INTRAMUSCULAR | Status: DC

## 2019-02-01 MED ORDER — DOCUSATE SODIUM 100 MG PO CAPS
200.0000 mg | ORAL_CAPSULE | Freq: Every day | ORAL | Status: DC
  Administered 2019-02-02 – 2019-02-09 (×7): 200 mg via ORAL
  Filled 2019-02-01 (×9): qty 2

## 2019-02-01 MED ORDER — DIGOXIN 0.25 MG/ML IJ SOLN
0.5000 mg | Freq: Once | INTRAMUSCULAR | Status: DC
  Filled 2019-02-01: qty 2

## 2019-02-01 MED ORDER — WHITE PETROLATUM EX OINT
TOPICAL_OINTMENT | CUTANEOUS | Status: AC
  Administered 2019-02-01: 17:00:00
  Filled 2019-02-01: qty 28.35

## 2019-02-01 NOTE — Progress Notes (Signed)
Received call from pharmacy approx 2015, informed of increase to heparin gtt rate.  1st bolus complete at at 2235.  Paged NP Baltazar Najjar at 2050. Pt. BP 83/49 MAP 60, HR 110's to low 120's. Digoxin .5mg  ordered, not yet given.RespTher has concerns about ABG ordered. Spoke with Baltazar Najjar via phone, ok to give Digoxin, ABG cancelled, continue to monitor BP and bolus ordered.   Spoke with daughter via phone at 2231. Informed patient resting, not agitated, monitoring BP and receiving fluids. Informed med for constipation requested.   Paged NP Baltazar Najjar at [phone].Digoxin not yet given,patient episodes when HR drops to 60's or 70's not-sustained.Lactic Acid 3.0 @2246 .manual  BP 100/50  Notified by tele at 2331 4.81 pause. RN at bedside giving patient oral liquid potassium. Charge RN notified and arrived to bedside.  Paged NP Baltazar Najjar at [phone].Notified by tele 4.81 pause at 2331. Patient HR dropping to 70-80's sustained.with interaction 90's, not above 100.   2341 NP at bedside. Per verbal order EKG completed.   During night patient oriented to self, able to engage in conversation (although disoriented) when spoken to. Patient able to communicate not in pain. Returns to rest, calm when not engaged.   Patient consumed K+ administered with OJ, drank approx 1/2 of dose.   Patient stated need to use bathroom, NT and RN attempt to assist with using bed pan. Patient became agitated and irritable. Informed patient had med to help make stools less painful.  Patient agitated during attempt to adminsiter oral meds in applesauce. Patient refused meds and spit out meds on NT present at bedside with RN. Patient increasing irritability and agitation with interaction. Upon comforting and repositiioning for sleep, patient calmer and returned to sleep.  Paged NP Baltazar Najjar at [phone]. Notifed patient refused labs.Patient agitated, irritable, swatting with attempt to draw labs.HR increased to 154. Upon comforting HR 90's  Paged NP kirby at  [phone]. BP 95/58 (69) and 101/55 (70). HR 60's-70's. Sleeping, resting comfortably.  0653 patient awake and alert, engaged in conversation (disoriented to time, place and situation).attempted to assess if patient soiled and inquired about taking a bath; patient refused

## 2019-02-01 NOTE — Progress Notes (Signed)
  Speech Language Pathology Treatment: Dysphagia  Patient Details Name: Richard [name] MRN: [id number] DOB: 1940/06/28 Today's Date: 02/01/2019 Time: 5189-8421 SLP Time Calculation (min) (ACUTE ONLY): 11 min  Assessment / Plan / Recommendation Clinical Impression  Pt agreeable to repositioning and straw sips water consumed without s/s aspiration. Pt became agitated when MD arrived and checked pt's catheter and became verbally agitated cked. Unexpectedly pt swung left mitted hand smacking MD on the side of his face, fortunately without significant force. Trials of pudding solids and additional sips water were attempted however pt moving head, cursing and eventually made spitting gesture on last attempt. Pt is endentulous and based on yesterday's swallow assessment, Dys 1 (puree) is most appropriate texture as well as thin liquids. He will need continued support for feeding and encouragement that does not require continued skilled intervention from [place] in acute care setting at present time. He is also being followed by Palliative care. At next venue, recommend possible assessment for texture upgrade. Pills whole in puree unless he pockets (then crush).Upright position, full supervision, small sips, straws allowed. Will sign off at this time.    HPI HPI: 78 y.o. 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 Daughter reports having to feed her father recently at home.  RN reports coughing and regurgitation of breakfast, hence SLP swallow eval ordered.       SLP Plan  All goals met       Recommendations  Diet recommendations: Thin liquid;Dysphagia 1 (puree) Liquids provided via:  Cup;Straw Medication Administration: Whole meds with puree Supervision: Staff to assist with self feeding;Full supervision/cueing for compensatory strategies Compensations: Minimize environmental distractions;Slow rate;Small sips/bites Postural Changes and/or Swallow Maneuvers: Seated upright 90 degrees                Oral Care Recommendations: Oral care BID Follow up Recommendations: Other (comment)(next venue of care for possible upgrade) SLP Visit Diagnosis: Dysphagia, unspecified (R13.10) Plan: All goals met                       Houston Siren 02/01/2019, 8:25 AM  Orbie Pyo Colvin Caroli.Ed Risk analyst [phone] Office [phone]

## 2019-02-01 NOTE — Consult Note (Signed)
   [facility] CM Inpatient Consult   02/01/2019  Kenard Morawski 07-Feb-1941 [id number]  Patient screened for potential [place] Management services are needed.  Review of patient's medical record reveals patient is currently being consulted for Palliative verses Hospice care.  Chart reviewed briefly with Palliative consult notes.  Primary Care Provider is  Juluis Mire, MD  Insurance Plan:  [facility]   Will follow for disposition with [facility] team and recommendations to assess needs.     Please place a [facility] Care Management consult as appropriate and for questions contact:   Natividad Brood, RN BSN [facility] Liaison  [phone] business mobile phone Toll free office [phone]  Fax number: [phone] [email or web address]

## 2019-02-01 NOTE — Progress Notes (Addendum)
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                             +---------+---------------+---------+-----------+----------+-------+  PTV       Full                                                      +---------+---------------+---------+-----------+----------+-------+  PERO      Full                                                      +---------+---------------+---------+-----------+----------+-------+     Summary: Right: Findings consistent with acute deep vein thrombosis involving the right common femoral vein. No cystic structure found in the popliteal fossa. Left: There is no evidence of deep vein thrombosis in the lower extremity. No cystic structure found in the popliteal fossa.  *See table(s) above for measurements and observations. Electronically signed by Servando Snare MD on 01/31/2019 at 2:16:43 PM.    Final    Ct Angio Abd/pel W And/or Wo Contrast  Result Date: 01/30/2019 CLINICAL  DATA:  Initial lung cancer. Concern for pulmonary embolism or ischemia bowel. EXAM: CT ANGIOGRAPHY CHEST, ABDOMEN AND PELVIS TECHNIQUE: Multidetector CT imaging through the chest, abdomen and pelvis was performed using the standard protocol during bolus administration of intravenous contrast. Multiplanar reconstructed images and MIPs were obtained and reviewed to evaluate the vascular anatomy. CONTRAST:  143mL OMNIPAQUE IOHEXOL 350 MG/ML SOLN COMPARISON:  CT 12/14/2018 FINDINGS: CTA CHEST FINDINGS Cardiovascular: Filling defect within the RIGHT lower lobe pulmonary artery (image 102/5) Filling defect within the LEFT lobe pulmonary arteries (image 97/5). Filling defect within the LEFT upper lobe pulmonary artery. Overall clot burden is moderate. The RIGHT ventricle ratio to LEFT ventricle ratio is greater than 1 (1.36). Of note this ratio is similar to CT of 12/14/2019 when there was no pulmonary emboli. Mediastinum/Nodes: No axillary supraclavicular adenopathy. Perihilar thickening in the RIGHT hilum similar prior. Lungs/Pleura: There is collapse of the RIGHT middle lobe. Mass in the RIGHT upper lobe seen on comparison exam which not well appreciated but measures potentially 4.7 cm on image 87/5 increased from 3.8 cm on prior. Musculoskeletal: No aggressive osseous lesion. Review of the MIP images confirms the above findings. CTA ABDOMEN AND PELVIS FINDINGS VASCULAR Aorta: No dissection or aneurysm. Celiac: Widely patent. SMA: Widely patent. Renals: Single renal artery on the LEFT and 2 renal arteries on the RIGHT all widely patent. IMA: Widely patent Inflow: Normal interval calcifications of the iliac arteries. Veins: No acute finding Review of the MIP images confirms the above findings. NON-VASCULAR Hepatobiliary: No focal hepatic lesion. Pancreas: No pancreatic inflammation. Spleen: Normal. Adrenals/Urinary Tract: adrenal glands and kidneys normal. Bladder is distended to level the umbilicus. Stomach/Bowel: Small  hiatal hernia. The stomach duodenum normal. No evidence of bowel ischemia no bowel dilatation appendix normal. The colon and rectosigmoid colon are normal. Lymphatic: No abdominopelvic lymphadenopathy Reproductive: Prostate nodular dense the base the bladder. Other: Evaluation of the pelvic floor is limited by the LEFT hip prosthetic Musculoskeletal: No aggressive osseous lesion Review of the MIP images confirms the above findings. IMPRESSION: Chest Impression: 1. Acute pulmonary embolism in the LEFT and RIGHT lower lobe pulmonary arteries and LEFT upper lobe. Overall clot burden is moderate. 2. Positive for acute PE with CT evidence of right heart strain (RV/LV Ratio = 1.36). This ratio suggests RIGHT heart strain; however, the ratio of RIGHT ventricle to LEFT ventricle diameter is similar to CT of 12/14/2018 at which time there was no PE. 3. Large RIGHT upper lobe mass with collapse of the RIGHT middle lobe. Mass is difficult to define but appears larger than comparison exam Abdomen / Pelvis Impression: 1. No acute findings abdominal  aorta. 2. No evidence of vascular compromise in the abdomen pelvis. 3. No evidence of bowel ischemia. 4. The bladder is distended.  Recommend decompression. Critical Value/emergent results were called by telephone at the time of interpretation on 01/30/2019 at 7:32 pm to Dr. ; Elnora Morrison , who verbally acknowledged these results. Electronically Signed   By: Suzy Bouchard M.D.   On: 01/30/2019 19:33      LOS: 2 days   Time spent: More than 50% of that time was spent in counseling and/or coordination of care.  Antonieta Pert, MD [facility]  02/01/2019, 2:04 PM

## 2019-02-01 NOTE — Progress Notes (Signed)
Palliative: Richard [name] is resting quietly in bed.  He is sleeping soundly but wakes when I touch his arm. He appears acutely/chronically ill and frail. There is no family at bedside at this time.   I ask if he would like something to drink and he nods yes, but when I hold the straw to his mouth he will not attempt to drink.  I ask if he wants pudding, and again he indicates that he would.  When I place a small amount in his mouth, he starts cursing and spits it out. Multiple times during my visit he will cut his eyes at me angrily.  He looks very mistrustful.    I return later in the morning, but Richard [name] is not present.  [name] to daughter, Richard [name] at [phone].  We talked about Richard [name]'s behavior this morning, his inability to eat and drink.  Richard [name] states that he is waiting for her.  She tells me that she is waiting on a ride to come to the hospital.  Richard [name] tells me that she spoke with her uncle Richard [name], but he did not share his conversation with PMT.  She asks if uncle Richard [name] had wanted DNR status.  At this point Richard [name] agrees to DNR status, but she requests that her father not have the purple DNR bracelet on his arm.  She shares her concern that if worker see this bracelet he would not receive care.  I reassure her that we will continue to care for Richard [name] regardless of his CODE STATUS.  When we discussed placing purple DNR band yesterday, Richard [name] became very emotional and changed to FULL code.  [name]'s request regarding DNR band discussed with charge nurse and Therapist, sports.   We talked about disposition, if [name] improves enough for discharge.  Richard [name] states that she would be agreeable to rehab if her father improves and qualifies.  She tells me that she has no preference of facility.  I share that our case managers/social workers will reach out to her to provide information.    [name] to Richard [name]'s brother, Richard [name], at [phone] to update.  Richard [name] shares that Richard [name] is  extremely mistrustful if his daughter Richard [name] is not with him.  As we are talking Richard [name] calls in and Richard [name] puts her on three-way [name].  Richard [name] calls Richard [name]").  We are talking about Richard [name]'s mistrustful behavior, not eating and drinking per staff.  I share my concern that Richard [name] would do the same in any rehab, and they readily agree.  They now agree that he would not do well in rehab.  I share that if Richard [name] improves enough to leave the hospital, he will likely be very weak, need around-the-clock care.  Richard [name] states that she is used to providing all care for her father, although previously she said shared that he was able to participate in some of his bathing.     We talked about ability to recover, and I share that the longer it takes Richard [name] to recover, the weaker I expect he will be, the more care he will need.  I share that Richard [name] is so weak that at this point, I doubt that he would qualify for any further chemo therapy, and both Richard [name] and [place] agree.  We talked about his blood clots, and that they may take months to resolve.  I share with Richard [name] that she has mentioned an incredible decline in the last  2 to 3 months.   Family is requesting to continue to treat the treatable at this point.  I share that Richard [name]'s body will let us know if he is able to improve.  We talked about keeping him at the center of decision-making, what he would want this time to look like feel like.  I share that we know that modern medicine can prolong life, but it can also prolong the dying process, and thereby prolong suffering.  I share that within the next few weeks or months hospice care may be appropriate choice.  [name]'s grandmother and uncle, Paul's mother and brother, both had hospice care, so family is experienced.    PMT provider to pick up Thursday/Friday. Family aware.  Conference with attending, bedside nursing staff, SW/CM related to patient condition, [place] discussion,  disposition.  Plan: Continue to treat the treatable, but no CPR, no intubation.  Family agrees that Richard [name] would not do well in rehab and is also likely too weak for further chemotherapy.   65 minutes, extended time [name], NP Palliative Medicine Team Team Phone # [phone] Greater than 50% of this time was spent counseling and coordinating care related to the above assessment and plan.

## 2019-02-01 NOTE — Progress Notes (Signed)
ANTICOAGULATION CONSULT NOTE - Follow Up Consult  Pharmacy Consult for Heparin Indication: pulmonary embolus/DVT  No Known Allergies  Patient Measurements: Height: 6\' 1"  (185.4 cm) Weight: 150 lb 2.1 oz (68.1 kg) IBW/kg (Calculated) : 79.9 Heparin Dosing Weight: 68.1 kg  Vital Signs: Temp: 98.2 F (36.8 C) (08/18 1838) Temp Source: Axillary (08/18 1838) BP: 94/49 (08/18 1942) Pulse Rate: 118 (08/18 1616)  Labs: Recent Labs    01/30/19 1542 01/30/19 1553 01/30/19 2131  01/31/19 0426 01/31/19 1125 01/31/19 2200 02/01/19 1852  HGB 13.0 13.3 11.7*  --  11.6*  --   --  10.5*  HCT 41.6 39.0 35.9*  --  36.6*  --   --  33.7*  PLT 212  --  186  --  205  --   --  185  LABPROT 14.7  --   --   --   --   --   --   --   INR 1.2  --   --   --   --   --   --   --   HEPARINUNFRC  --   --   --    < > 0.96* 0.77* 0.53 0.29*  CREATININE 1.40* 1.30*  --   --  1.59*  --   --  1.09   < > = values in this interval not displayed.    Estimated Creatinine Clearance: 55.5 mL/min (by C-G formula based on SCr of 1.09 mg/dL).   Medications:  Medications Prior to Admission  Medication Sig Dispense Refill Last Dose  . aspirin (ASPIRIN 81) 81 MG EC tablet Take 1 tablet (81 mg total) by mouth daily. 90 tablet 3 01/29/2019 at Unknown time  . atorvastatin (LIPITOR) 20 MG tablet Take 1 tablet (20 mg total) by mouth every evening. 90 tablet 1 01/29/2019 at Unknown time  . Omega-3 Fatty Acids (FISH OIL PO) Take 1 capsule by mouth daily.   01/29/2019 at Unknown time  . HYDROcodone-acetaminophen (NORCO/VICODIN) 5-325 MG tablet Take 1 tablet by mouth every 4 (four) hours as needed for moderate pain. (Patient not taking: Reported on 01/30/2019) 12 tablet 0 Not Taking at Unknown time  . nicotine (NICODERM CQ - DOSED IN MG/24 HR) 7 mg/24hr patch Place 1 patch (7 mg total) onto the skin daily. (Patient not taking: Reported on 12/31/2018) 28 patch 0   . prochlorperazine (COMPAZINE) 10 MG tablet Take 1 tablet (10 mg  total) by mouth every 6 (six) hours as needed for nausea or vomiting. 30 tablet 0     Assessment: 85 yom presenting with acute bilateral PE, moderate clot burden with RHS. Pharmacy consulted to dose heparin. Not on anticoagulation PTA. No active bleed issues documented - did report coffee-ground emesis and concern for BRBPR on 8/16 AM. Initially given Protonix bolus but not continued as EDP has low suspicion for GIB.  Venous duplex bilateral LE done 01/31/19 :  Positive for RLE DVT  Heparin level slightly subtherapeutic at 0.29. There were some short pauses for the heparin drip documented in the [facility], but night shift RN was not informed of any issues with the infusion. RN noted some blood in the urine earlier this morning and would call back if this worsens.   Of note, patient has been refusing blood draws.  Goal of Therapy:  Heparin level 0.3-0.7 units/ml Monitor platelets by anticoagulation protocol: Yes   Plan:  Increase heparin drip slightly to 1050 units/hr Confirmatory heparin level with AM labs Daily heparin level, CBC  Vertis Kelch, PharmD PGY2 Cardiology Pharmacy Resident Phone [phone] 02/01/2019       8:12 PM  Please check [email or web address] for unit-specific pharmacist phone numbers

## 2019-02-01 NOTE — Progress Notes (Addendum)
0918: Patient refusing labs. Refusing blood pressure, yelling "I changed my mother fucking mind. Fucking with me. The fuck wrong with you. You not sticking me, you not sticking me." Lab told to come back later. Will message MD  [phone]: Patient also refusing to eat and refusing to have foley area cleaned. Attempting to hit staff. Calms when staff not present.   1746: Page to MD. Patient HR 120-150s in afib on monitor. SBP 90s.  1848: MEWS firing 7. MD aware. Patient is DNR.   1930: Page to on call MD for medication for constipation and anxiety/agitation.

## 2019-02-01 NOTE — Progress Notes (Addendum)
MEWS score 6. Pt tachycardic but has not received the Digoxin ordered by cardiology. His respiratory status is fine. BP is soft, but MAP is 60. Ordered bolus. RN to check manual BP after bolus and call. Order LA if pt will allow-he becomes agitated at times. Replacing low K (2.9). Na is high. Changed IVF. Recheck BMP in am. Will follow.  KJKG, NP [facility] Addendum: manual BP after bolus is 100/50. His HR now goes as high as 100, but he is dropping to the 40s non sustained. We will hold off on the Digoxin for now. He is making urine. LA is 3, so will give some further fluid.  KJKG, NP [facility] Addendum: Pt had a 4.81 second pause on tele. Getting EKG. NP to bedside. BP above 90 now. Pt is awake and confused, thinks provider is his mother. Review of MAR shows no beta blockers and we have still not given the Digoxin.  KJKG, NP [facility] Addendum: EKG looks fine. No AV block. Will watch for further pauses. Call cards if recurs. He is a DNR, but family still wants full scope of treatment.  KJKG, NP [facility]

## 2019-02-01 NOTE — Consult Note (Addendum)
Cardiology Consultation:   Patient ID: Richard [name]; [id number]; 03-04-1941   Admit date: 01/30/2019 Date of Consult: 02/01/2019  Primary Care Provider: [name], NP Primary Cardiologist: New  Patient Profile:   Richard [name] is a 78 y.o. male with a hx of non-small cell lung carcinoma stage III treated with carboplatin/paclitaxel, hypertension, hyperlipidemia and advanced dementia who is being seen today for the evaluation of new onset atrial fibrillation with RVR at the request of Dr. Lupita Leash.  History of Present Illness:   Richard [name] is a 78 year old male with a history of stated above who presented to [facility] on 01/30/2019 with increased fatigue. Patient has advanced dementia and is typically oriented to self only therefore, HPI obtained through chart review and patient daughter who is at bedside. She states that one day prior to presentation, she noticed patient was having difficulty breathing with associated coughing with some blood in his sputum. Given this, she brought him to the ED for further evaluation   On arrival, he was found to be hypotensive and tachycardic as well as tachypneic. SPO2 was found to be in the mid to upper 80s on room air.  Rapid COVID-19 test was negative.  He had leukocytosis on lab work.  Lactic acid was elevated at 5.6.  He had a heme positive stool.  CXR with stable dense consolidation and centrally obstructing mass at the right lung base.  Follow-up CT angiogram showed acute PE in the left and right lower lobe pulmonary arteries and left upper lobe.  Overall clot burden was found to be moderate.  There was evidence of right heart strain RV/LV ratio=1.36 He was started on heparin infusion for PE. He was admitted to Internal Medicine service and was treated for sepsis PNA, PE and DVT in the setting of lung CA.   On 02/01/2019 he was found to be in new onset atrial fibrillation with rates in the 120's. Cardiology was therefore asked to evaluate. He has no  prior hx of CAD or AF. Denies anginal symptoms, palpitations, dizziness, orthopnea or syncope.   Past Medical History:  Diagnosis Date   Dementia ([place])    High cholesterol    Hypertension    Lung mass 12/2018    Past Surgical History:  Procedure Laterality Date   JOINT REPLACEMENT     VIDEO BRONCHOSCOPY WITH ENDOBRONCHIAL ULTRASOUND N/A 12/16/2018   Procedure: VIDEO BRONCHOSCOPY WITH ENDOBRONCHIAL ULTRASOUND AND FLUROSCOPY;  Surgeon: [name], MD;  Location: [facility];  Service: Pulmonary;  Laterality: N/A;     Prior to Admission medications   Medication Sig Start Date End Date Taking? Authorizing Provider  aspirin (ASPIRIN 81) 81 MG EC tablet Take 1 tablet (81 mg total) by mouth daily. 03/18/18  Yes Clent Demark, PA-C  atorvastatin (LIPITOR) 20 MG tablet Take 1 tablet (20 mg total) by mouth every evening. 12/31/18  Yes [name], NP  Omega-3 Fatty Acids (FISH OIL PO) Take 1 capsule by mouth daily.   Yes [provider]  HYDROcodone-acetaminophen (NORCO/VICODIN) 5-325 MG tablet Take 1 tablet by mouth every 4 (four) hours as needed for moderate pain. Patient not taking: Reported on 01/30/2019 12/17/18   Geradine Girt, DO  nicotine (NICODERM CQ - DOSED IN MG/24 HR) 7 mg/24hr patch Place 1 patch (7 mg total) onto the skin daily. Patient not taking: Reported on 12/31/2018 12/18/18   Geradine Girt, DO  prochlorperazine (COMPAZINE) 10 MG tablet Take 1 tablet (10 mg total) by mouth every 6 (  six) hours as needed for nausea or vomiting. 01/12/19   Heilingoetter, Cassandra L, PA-C    Inpatient Medications: Scheduled Meds:  atorvastatin  20 mg Oral QPM   tamsulosin  0.4 mg Oral QHS   Continuous Infusions:  sodium chloride Stopped (02/01/19 0746)   ceFEPime (MAXIPIME) IV 2 g (02/01/19 1758)   dextrose 5 % and 0.45% NaCl 50 mL/hr at 02/01/19 1549   heparin 1,000 Units/hr (02/01/19 1601)   vancomycin Stopped (01/31/19 2141)   PRN Meds: sodium chloride,  acetaminophen **OR** acetaminophen, metoprolol tartrate  Allergies:   No Known Allergies  Social History:   Social History   Socioeconomic History   Marital status: Divorced    Spouse name: Not on file   Number of children: Not on file   Years of education: Not on file   Highest education level: Not on file  Occupational History   Not on file  Social Needs   Financial resource strain: Not on file   Food insecurity    Worry: Not on file    Inability: Not on file   Transportation needs    Medical: Not on file    Non-medical: Not on file  Tobacco Use   Smoking status: Current Some Day Smoker    Packs/day: 1.00    Years: 60.00    Pack years: 60.00    Types: Cigarettes   Smokeless tobacco: Never Used  Substance and Sexual Activity   Alcohol use: Yes    Comment: occasional   Drug use: No   Sexual activity: Not on file  Lifestyle   Physical activity    Days per week: Not on file    Minutes per session: Not on file   Stress: Not on file  Relationships   Social connections    Talks on phone: Not on file    Gets together: Not on file    Attends religious service: Not on file    Active member of club or organization: Not on file    Attends meetings of clubs or organizations: Not on file    Relationship status: Not on file   Intimate partner violence    Fear of current or ex partner: Not on file    Emotionally abused: Not on file    Physically abused: Not on file    Forced sexual activity: Not on file  Other Topics Concern   Not on file  Social History Narrative   Not on file    Family History:   Family History  Problem Relation Age of Onset   Stomach cancer Mother    COPD Father    Lung cancer Brother    Lung cancer Brother    Family Status:  Family Status  Relation Name Status   Mother  (Not Specified)   Father  (Not Specified)   Brother  (Not Specified)   Brother  Deceased    ROS:  Please see the history of present illness.   All other ROS reviewed and negative.     Physical Exam/Data:   Vitals:   02/01/19 0931 02/01/19 1000 02/01/19 1342 02/01/19 1417  BP: 115/66 (!) 116/59 111/72 103/60  Pulse:      Resp: (!) 28 (!) 22 19 20   Temp:      TempSrc:      SpO2:   100%   Weight:      Height:        Intake/Output Summary (Last 24 hours) at 02/01/2019 1825 Last data filed at  02/01/2019 1345 Gross per 24 hour  Intake 2530.69 ml  Output 900 ml  Net 1630.69 ml   Filed Weights   01/30/19 1210 01/31/19 0900  Weight: 69.1 kg 68.1 kg   Body mass index is 19.81 kg/m.   General: Elderly, frail, NAD Neck: Negative for carotid bruits. No JVD Lungs: Diminished with bilateral crackles. Breathing is unlabored. Cardiovascular: Irregularly irregular with S1 S2. No murmurs, rubs, gallops, or LV heave appreciated. Abdomen: Soft, non-tender, non-distended. No obvious abdominal masses. Extremities: No edema. No clubbing or cyanosis. DP/PT pulses 2+ bilaterally Neuro: Alert and oriented to self only. No focal deficits. No facial asymmetry. MAE spontaneously. Psych: Responds to questions somewhat appropriately with normal affect.     EKG:  The EKG was personally reviewed and demonstrates: ST with no acute changes. HR 123 Telemetry:  Telemetry was personally reviewed and demonstrates: 02/01/2019 AF with variable rates from 60-130's   Relevant CV Studies:  ECHO: 02/01/2019   1. The left ventricle has normal systolic function with an ejection fraction of 60-65%. The cavity size was normal. Left ventricular diastolic Doppler parameters are indeterminate. No evidence of left ventricular regional wall motion abnormalities.  2. The right ventricle has normal systolic function. The cavity was normal. There is no increase in right ventricular wall thickness. Right ventricular systolic pressure is severely elevated (70mmHg)  3. Tricuspid valve regurgitation is moderate-severe.  4. The aorta is normal unless otherwise noted.   5. Tachycardia, sinus, 118bpm.  FINDINGS  Left Ventricle: The left ventricle has normal systolic function, with an ejection fraction of 60-65%. The cavity size was normal. There is no increase in left ventricular wall thickness. Left ventricular diastolic Doppler parameters are indeterminate.  No evidence of left ventricular regional wall motion abnormalities..  Laboratory Data:  Chemistry Recent Labs  Lab 01/30/19 1542 01/30/19 1553 01/31/19 0426  NA 147* 147* 155*  K 4.5 4.2 4.4  CL 110  --  123*  CO2 19*  --  21*  GLUCOSE 114*  --  99  BUN 50*  --  45*  CREATININE 1.40* 1.30* 1.59*  CALCIUM 11.5*  --  10.4*  GFRNONAA 48*  --  42*  GFRAA 56*  --  48*  ANIONGAP 18*  --  11    Total Protein  Date Value Ref Range Status  01/31/2019 6.4 (L) 6.5 - 8.1 g/dL Final  10/29/2016 7.7 6.0 - 8.5 g/dL Final   Albumin  Date Value Ref Range Status  01/31/2019 2.4 (L) 3.5 - 5.0 g/dL Final  10/29/2016 4.5 3.5 - 4.8 g/dL Final   AST  Date Value Ref Range Status  01/31/2019 22 15 - 41 U/L Final  01/24/2019 26 15 - 41 U/L Final   ALT  Date Value Ref Range Status  01/31/2019 19 0 - 44 U/L Final  01/24/2019 23 0 - 44 U/L Final   Alkaline Phosphatase  Date Value Ref Range Status  01/31/2019 72 38 - 126 U/L Final   Total Bilirubin  Date Value Ref Range Status  01/31/2019 0.7 0.3 - 1.2 mg/dL Final  01/24/2019 0.4 0.3 - 1.2 mg/dL Final   Hematology Recent Labs  Lab 01/30/19 1542 01/30/19 1553 01/30/19 2131 01/31/19 0426  WBC 18.3*  --  15.5* 17.6*  RBC 6.36*  --  5.73 5.73  HGB 13.0 13.3 11.7* 11.6*  HCT 41.6 39.0 35.9* 36.6*  MCV 65.4*  --  62.7* 63.9*  MCH 20.4*  --  20.4* 20.2*  MCHC 31.3  --  32.6 31.7  RDW 17.8*  --  17.2* 17.4*  PLT 212  --  186 205   Cardiac EnzymesNo results for input(s): TROPONINI in the last 168 hours. No results for input(s): TROPIPOC in the last 168 hours.  BNPNo results for input(s): BNP, PROBNP in the last 168 hours.  DDimer No results  for input(s): DDIMER in the last 168 hours. TSH: No results found for: TSH Lipids: Lab Results  Component Value Date   CHOL 252 (H) 10/29/2016   HDL 62 10/29/2016   LDLCALC 152 (H) 10/29/2016   TRIG 188 (H) 10/29/2016   CHOLHDL 4.1 10/29/2016   HgbA1c:No results found for: HGBA1C  Radiology/Studies:  Dg Chest 1 View  Result Date: 02/01/2019 CLINICAL DATA:  Shortness of breath, altered mental status EXAM: CHEST  1 VIEW COMPARISON:  01/30/2019 FINDINGS: Consolidation in the right lower lung is stable. Underlying COPD. Left lung clear. Heart is normal size. IMPRESSION: Stable right basilar atelectasis or scar site stable right basilar infiltrate/consolidation. Electronically Signed   By: Rolm Baptise M.D.   On: 02/01/2019 08:33   Ct Angio Chest Pe W And/or Wo Contrast  Result Date: 01/30/2019 CLINICAL DATA:  Initial lung cancer. Concern for pulmonary embolism or ischemia bowel. EXAM: CT ANGIOGRAPHY CHEST, ABDOMEN AND PELVIS TECHNIQUE: Multidetector CT imaging through the chest, abdomen and pelvis was performed using the standard protocol during bolus administration of intravenous contrast. Multiplanar reconstructed images and MIPs were obtained and reviewed to evaluate the vascular anatomy. CONTRAST:  164mL OMNIPAQUE IOHEXOL 350 MG/ML SOLN COMPARISON:  CT 12/14/2018 FINDINGS: CTA CHEST FINDINGS Cardiovascular: Filling defect within the RIGHT lower lobe pulmonary artery (image 102/5) Filling defect within the LEFT lobe pulmonary arteries (image 97/5). Filling defect within the LEFT upper lobe pulmonary artery. Overall clot burden is moderate. The RIGHT ventricle ratio to LEFT ventricle ratio is greater than 1 (1.36). Of note this ratio is similar to CT of 12/14/2019 when there was no pulmonary emboli. Mediastinum/Nodes: No axillary supraclavicular adenopathy. Perihilar thickening in the RIGHT hilum similar prior. Lungs/Pleura: There is collapse of the RIGHT middle lobe. Mass in the RIGHT upper lobe  seen on comparison exam which not well appreciated but measures potentially 4.7 cm on image 87/5 increased from 3.8 cm on prior. Musculoskeletal: No aggressive osseous lesion. Review of the MIP images confirms the above findings. CTA ABDOMEN AND PELVIS FINDINGS VASCULAR Aorta: No dissection or aneurysm. Celiac: Widely patent. SMA: Widely patent. Renals: Single renal artery on the LEFT and 2 renal arteries on the RIGHT all widely patent. IMA: Widely patent Inflow: Normal interval calcifications of the iliac arteries. Veins: No acute finding Review of the MIP images confirms the above findings. NON-VASCULAR Hepatobiliary: No focal hepatic lesion. Pancreas: No pancreatic inflammation. Spleen: Normal. Adrenals/Urinary Tract: adrenal glands and kidneys normal. Bladder is distended to level the umbilicus. Stomach/Bowel: Small hiatal hernia. The stomach duodenum normal. No evidence of bowel ischemia no bowel dilatation appendix normal. The colon and rectosigmoid colon are normal. Lymphatic: No abdominopelvic lymphadenopathy Reproductive: Prostate nodular dense the base the bladder. Other: Evaluation of the pelvic floor is limited by the LEFT hip prosthetic Musculoskeletal: No aggressive osseous lesion Review of the MIP images confirms the above findings. IMPRESSION: Chest Impression: 1. Acute pulmonary embolism in the LEFT and RIGHT lower lobe pulmonary arteries and LEFT upper lobe. Overall clot burden is moderate. 2. Positive for acute PE with CT evidence of right heart strain (RV/LV Ratio = 1.36). This ratio suggests RIGHT heart strain; however, the ratio  of RIGHT ventricle to LEFT ventricle diameter is similar to CT of 12/14/2018 at which time there was no PE. 3. Large RIGHT upper lobe mass with collapse of the RIGHT middle lobe. Mass is difficult to define but appears larger than comparison exam Abdomen / Pelvis Impression: 1. No acute findings abdominal aorta. 2. No evidence of vascular compromise in the abdomen  pelvis. 3. No evidence of bowel ischemia. 4. The bladder is distended.  Recommend decompression. Critical Value/emergent results were called by telephone at the time of interpretation on 01/30/2019 at 7:32 pm to Dr. ; Elnora Morrison , who verbally acknowledged these results. Electronically Signed   By: Suzy Bouchard M.D.   On: 01/30/2019 19:33   US Renal  Result Date: 01/30/2019 CLINICAL DATA:  Acute kidney injury. EXAM: RENAL / URINARY TRACT ULTRASOUND COMPLETE COMPARISON:  CT angiography earlier this day. FINDINGS: Right Kidney: Renal measurements: 11.0 x 4.3 x 6.0 cm = volume: 146 mL. Echogenicity within normal limits. No mass or hydronephrosis visualized. Left Kidney: Renal measurements: 10.7 x 6.6 x 5.0 cm = volume: 182 mL. Echogenicity within normal limits. No mass or hydronephrosis visualized. Bladder: Prominently distended, as seen on CT. No definite wall thickening. Soft tissue nodule at the bladder base measures 2.0 x 1.7 x 2.3 cm. IMPRESSION: 1. Normal sonographic appearance of the kidneys. 2. Marked urinary bladder distention is seen on CT. 3. Soft tissue nodule at the bladder base measuring 2.3 cm, on CT this appears contiguous with the prostate gland, however this communication is not well delineated sonographically. Electronically Signed   By: Keith Rake M.D.   On: 01/30/2019 23:18   Dg Chest Portable 1 View  Result Date: 01/30/2019 CLINICAL DATA:  Shortness of breath dry cough. Increased weakness. Decreased appetite and intake. History of lung cancer. EXAM: PORTABLE CHEST 1 VIEW COMPARISON:  12/16/2018 and chest CT dated 12/14/2018. FINDINGS: Stable dense consolidation at the right lung base with a centrally obstructing mass better seen on the previous CT. Clear left lung. Normal sized heart. Minimal bilateral AC joint degenerative changes. IMPRESSION: Stable dense consolidation and centrally obstructing mass at the right lung base. Electronically Signed   By: Claudie Revering M.D.   On:  01/30/2019 13:20   Vas [place] Lower Extremity Venous (dvt)  Result Date: 01/31/2019  Lower Venous Study Indications: Pulmonary embolism.  Risk Factors: Confirmed PE. Limitations: Poor ultrasound/tissue interface and patient immobility, poor patient cooperation, combative. Comparison Study: No prior studies. Performing Technologist: Oliver Hum RVT  Examination Guidelines: A complete evaluation includes B-mode imaging, spectral Doppler, color Doppler, and power Doppler as needed of all accessible portions of each vessel. Bilateral testing is considered an integral part of a complete examination. Limited examinations for reoccurring indications may be performed as noted.  +---------+---------------+---------+-----------+----------+--------------+  RIGHT     Compressibility Phasicity Spontaneity Properties Summary         +---------+---------------+---------+-----------+----------+--------------+  CFV       Partial         Yes       Yes                    Acute           +---------+---------------+---------+-----------+----------+--------------+  SFJ       Full                                                             +---------+---------------+---------+-----------+----------+--------------+  FV Prox   Full                                                             +---------+---------------+---------+-----------+----------+--------------+  FV Mid    Full                                                             +---------+---------------+---------+-----------+----------+--------------+  FV Distal Full                                                             +---------+---------------+---------+-----------+----------+--------------+  PFV       Full                                                             +---------+---------------+---------+-----------+----------+--------------+  POP       Full            No        No                                      +---------+---------------+---------+-----------+----------+--------------+  PTV       Full                                                             +---------+---------------+---------+-----------+----------+--------------+  PERO      Full                                                             +---------+---------------+---------+-----------+----------+--------------+  EIV       Full            Yes       Yes                                    +---------+---------------+---------+-----------+----------+--------------+  CIV                                                        Not visualized  +---------+---------------+---------+-----------+----------+--------------+  Unable to visualize the common iliac vein due to patient cooperation, and combativeness.  +---------+---------------+---------+-----------+----------+-------+  LEFT      Compressibility Phasicity Spontaneity Properties Summary  +---------+---------------+---------+-----------+----------+-------+  CFV       Full            Yes       Yes                             +---------+---------------+---------+-----------+----------+-------+  SFJ       Full                                                      +---------+---------------+---------+-----------+----------+-------+  FV Prox   Full                                                      +---------+---------------+---------+-----------+----------+-------+  FV Mid    Full                                                      +---------+---------------+---------+-----------+----------+-------+  FV Distal Full                                                      +---------+---------------+---------+-----------+----------+-------+  PFV       Full                                                      +---------+---------------+---------+-----------+----------+-------+  POP       Full            Yes       Yes                             +---------+---------------+---------+-----------+----------+-------+   PTV       Full                                                      +---------+---------------+---------+-----------+----------+-------+  PERO      Full                                                      +---------+---------------+---------+-----------+----------+-------+     Summary: Right: Findings consistent with acute deep vein thrombosis involving the right common femoral vein. No cystic structure found in the popliteal  fossa. Left: There is no evidence of deep vein thrombosis in the lower extremity. No cystic structure found in the popliteal fossa.  *See table(s) above for measurements and observations. Electronically signed by Servando Snare MD on 01/31/2019 at 2:16:43 PM.    Final    Ct Angio Abd/pel W And/or Wo Contrast  Result Date: 01/30/2019 CLINICAL DATA:  Initial lung cancer. Concern for pulmonary embolism or ischemia bowel. EXAM: CT ANGIOGRAPHY CHEST, ABDOMEN AND PELVIS TECHNIQUE: Multidetector CT imaging through the chest, abdomen and pelvis was performed using the standard protocol during bolus administration of intravenous contrast. Multiplanar reconstructed images and MIPs were obtained and reviewed to evaluate the vascular anatomy. CONTRAST:  136mL OMNIPAQUE IOHEXOL 350 MG/ML SOLN COMPARISON:  CT 12/14/2018 FINDINGS: CTA CHEST FINDINGS Cardiovascular: Filling defect within the RIGHT lower lobe pulmonary artery (image 102/5) Filling defect within the LEFT lobe pulmonary arteries (image 97/5). Filling defect within the LEFT upper lobe pulmonary artery. Overall clot burden is moderate. The RIGHT ventricle ratio to LEFT ventricle ratio is greater than 1 (1.36). Of note this ratio is similar to CT of 12/14/2019 when there was no pulmonary emboli. Mediastinum/Nodes: No axillary supraclavicular adenopathy. Perihilar thickening in the RIGHT hilum similar prior. Lungs/Pleura: There is collapse of the RIGHT middle lobe. Mass in the RIGHT upper lobe seen on comparison exam which not well appreciated but  measures potentially 4.7 cm on image 87/5 increased from 3.8 cm on prior. Musculoskeletal: No aggressive osseous lesion. Review of the MIP images confirms the above findings. CTA ABDOMEN AND PELVIS FINDINGS VASCULAR Aorta: No dissection or aneurysm. Celiac: Widely patent. SMA: Widely patent. Renals: Single renal artery on the LEFT and 2 renal arteries on the RIGHT all widely patent. IMA: Widely patent Inflow: Normal interval calcifications of the iliac arteries. Veins: No acute finding Review of the MIP images confirms the above findings. NON-VASCULAR Hepatobiliary: No focal hepatic lesion. Pancreas: No pancreatic inflammation. Spleen: Normal. Adrenals/Urinary Tract: adrenal glands and kidneys normal. Bladder is distended to level the umbilicus. Stomach/Bowel: Small hiatal hernia. The stomach duodenum normal. No evidence of bowel ischemia no bowel dilatation appendix normal. The colon and rectosigmoid colon are normal. Lymphatic: No abdominopelvic lymphadenopathy Reproductive: Prostate nodular dense the base the bladder. Other: Evaluation of the pelvic floor is limited by the LEFT hip prosthetic Musculoskeletal: No aggressive osseous lesion Review of the MIP images confirms the above findings. IMPRESSION: Chest Impression: 1. Acute pulmonary embolism in the LEFT and RIGHT lower lobe pulmonary arteries and LEFT upper lobe. Overall clot burden is moderate. 2. Positive for acute PE with CT evidence of right heart strain (RV/LV Ratio = 1.36). This ratio suggests RIGHT heart strain; however, the ratio of RIGHT ventricle to LEFT ventricle diameter is similar to CT of 12/14/2018 at which time there was no PE. 3. Large RIGHT upper lobe mass with collapse of the RIGHT middle lobe. Mass is difficult to define but appears larger than comparison exam Abdomen / Pelvis Impression: 1. No acute findings abdominal aorta. 2. No evidence of vascular compromise in the abdomen pelvis. 3. No evidence of bowel ischemia. 4. The bladder is  distended.  Recommend decompression. Critical Value/emergent results were called by telephone at the time of interpretation on 01/30/2019 at 7:32 pm to Dr. ; Elnora Morrison , who verbally acknowledged these results. Electronically Signed   By: Suzy Bouchard M.D.   On: 01/30/2019 19:33    Assessment and Plan:   1.  New onset atrial fibrillation with RVR: -Pt presented with worsening  SOB found to have an acute PE/DVT per CTA >> was placed on IV Hep gtt.  -On 02/01/2019 pt was found to be in new onset atrial fibrillation with RVR -Anticoagulated with IV Hep as above -Will start IV dioxin tonight and titrate>>transisiton to PO tomorrow  -AF RVR in the setting of acute sepsis/PNA and recent treatment for lung CA. Not a candidate for DCCV at this time.  CHA2DS2VASc =5 (age, HTN, thromboembolism)  2.  Severe sepsis secondary to pneumonia in the setting of stage III non-small cell lung carcinoma: -Recent treatment for lung CA with carboplatin/paclitaxel>>now with suspicion for small liver mets -Follows with Dr. Isabella Stalling consult was 01/26/2019 for radiation therapy with plans for return for CT simulation for concomitant chemotherapy/radiation therapy.  -Palliative car has been consulted>>>now DNR   3.  Acute pulmonary embolism with acute hypoxic failure: -Acute PE found on CTA on arrival, in the setting of lung CA >>now treated with IV Hep -Follow up echocardiogram to evaluate for RV strain  -Also with DVT in the right leg per BLE duplex   4. AKI: -Creatinine, 1.59 on 01/31/2019 -Baseline appears to be in the 0.8 range   5. Severe sepsis secondary to PNA: -Treated with vancomycin/cefepime -Continue IV fluid hydration and follow up blood cultures  -Per primary team     For questions or updates, please contact [place] Please consult [email or web address] for contact info under Cardiology/STEMI.   Lyndel Safe NP-C [facility] Pager: [phone] 02/01/2019 6:25  PM  Personally seen and examined. Agree with above.   78 year old man with lactic acidosis, sepsis secondary to pneumonia and stage III non-small cell lung carcinoma with acute pulmonary embolism and hypoxic respiratory failure with acute kidney injury who developed atrial fibrillation with rapid ventricular response.  Blood pressure fairly hypotensive.  [place]ittens on for advanced dementia.  His daughter is in room.  GEN: Laying on his side in bed, not talking that much.  Confused.  Soft restraints in place with mittens. HEENT: normal  Neck: no JVD, carotid bruits, or masses Cardiac: Irregularly irregular, mildly tachycardic in the 120s; no murmurs, rubs, or gallops,no edema  Respiratory:  clear to auscultation bilaterally, normal work of breathing GI: soft, nontender, nondistended, + BS MS: no deformity or atrophy  Skin: warm and dry, no rash Neuro: Confused psych: Confused  Potassium 2.9, bicarb 19 creatinine now 1.09, white count 14.2 IV heparin on board.  EKG personally reviewed shows atrial fibrillation with rapid ventricular response.  Telemetry personally reviewed shows atrial fibrillation currently in the 118 range  Assessment and plan  Atrial fibrillation with rapid ventricular response - Are keys for treatment right now or for continued awareness of palliation.  -I think it is reasonable for [place] to utilize IV digoxin given his hypotension to see if we can help calm his heart rate down gently.  We will give 0.5 mg IV x1 now and 0.25 mg IV in 6 hours.  Tomorrow we will give him 0.125 mg p.o. once a day. - I would try to avoid IV amiodarone unless absolutely needed for excessive heart rate. - He does not seem to have enough pressure to adequately give beta-blockers ordered calcium channel blockers such as diltiazem.  He is not a candidate for DC cardioversion at this time.  DNR. -Spoke to his daughter about plan.  She understands.  Acute pulmonary embolism -Currently on IV  heparin.  Likely will transition to [place].  Lung cancer possibly stage IV, there is concern for liver mets -  Dr. Earlie Server has been following.    Given his advanced dementia, PE, likely overall poor prognosis.  Palliative care team is on board.  Hypokalemia -Replete potassium.  We will check up on him tomorrow.  Please let us know if we can be of further assistance.  CRITICAL CARE Performed by: Candee Furbish   Total critical care time: 35 minutes  Critical care time was exclusive of separately billable procedures and treating other patients.  Critical care was necessary to treat or prevent imminent or life-threatening deterioration.  Critical care was time spent personally by me on the following activities: development of treatment plan with patient and/or surrogate as well as nursing, discussions with consultants, evaluation of patient's response to treatment, examination of patient, obtaining history from patient or surrogate, ordering and performing treatments and interventions, ordering and review of laboratory studies, ordering and review of radiographic studies, pulse oximetry and re-evaluation of patient's condition.   Candee Furbish, MD

## 2019-02-02 ENCOUNTER — Telehealth: Payer: Self-pay | Admitting: Medical Oncology

## 2019-02-02 LAB — BASIC METABOLIC PANEL
Anion gap: 7 (ref 5–15)
BUN: 11 mg/dL (ref 8–23)
CO2: 24 mmol/L (ref 22–32)
Calcium: 8.9 mg/dL (ref 8.9–10.3)
Chloride: 120 mmol/L — ABNORMAL HIGH (ref 98–111)
Creatinine, Ser: 0.67 mg/dL (ref 0.61–1.24)
GFR calc Af Amer: >60 mL/min (ref >60)
GFR calc non Af Amer: >60 mL/min (ref >60)
Glucose, Bld: 99 mg/dL (ref 70–99)
Potassium: 2.9 mmol/L — ABNORMAL LOW (ref 3.5–5.1)
Sodium: 151 mmol/L — ABNORMAL HIGH (ref 135–145)

## 2019-02-02 LAB — CBC
HCT: 32.2 % — ABNORMAL LOW (ref 39.0–52.0)
Hemoglobin: 10.2 g/dL — ABNORMAL LOW (ref 13.0–17.0)
MCH: 20.3 pg — ABNORMAL LOW (ref 26.0–34.0)
MCHC: 31.7 g/dL (ref 30.0–36.0)
MCV: 64 fL — ABNORMAL LOW (ref 80.0–100.0)
Platelets: 203 10*3/uL (ref 150–400)
RBC: 5.03 MIL/uL (ref 4.22–5.81)
RDW: 17.5 % — ABNORMAL HIGH (ref 11.5–15.5)
WBC: 15.4 10*3/uL — ABNORMAL HIGH (ref 4.0–10.5)
nRBC: 1.7 % — ABNORMAL HIGH (ref 0.0–0.2)

## 2019-02-02 LAB — GLUCOSE, CAPILLARY: Glucose-Capillary: 105 mg/dL — ABNORMAL HIGH (ref 70–99)

## 2019-02-02 LAB — HEPARIN LEVEL (UNFRACTIONATED): Heparin Unfractionated: 0.34 IU/mL (ref 0.30–0.70)

## 2019-02-02 LAB — LACTIC ACID, PLASMA: Lactic Acid, Venous: 1.5 mmol/L (ref 0.5–1.9)

## 2019-02-02 MED ORDER — LACTATED RINGERS IV BOLUS
500.0000 mL | Freq: Once | INTRAVENOUS | Status: AC
  Administered 2019-02-02: 500 mL via INTRAVENOUS

## 2019-02-02 MED ORDER — POTASSIUM CHLORIDE 20 MEQ/15ML (10%) PO SOLN
40.0000 meq | Freq: Three times a day (TID) | ORAL | Status: AC
  Administered 2019-02-02 (×3): 40 meq via ORAL
  Filled 2019-02-02 (×3): qty 30

## 2019-02-02 NOTE — Progress Notes (Addendum)
Pharmacy Antibiotic Note  Richard [name] is a 78 y.o. male admitted on 01/30/2019 with pneumonia. Stage III NSCLC, started carboplatin/paclitaxel on 01/24/2019. Pharmacy has been consulted for vancomycin/cefepime dosing.  LA down to 1.5. Afebrile, Tmax 98.2. Scr 0.92 from 8/10, on admission elevated at 1.3>1.59>0.67. Improved renal function, will adjust vancomycin dosage  Plan: Increase Vancomycin to 1500 mg IV Q 24 hrs. Goal AUC 400-550. Expected AUC: 455.2 SCr used: 0.8 Continue Cefepime 2 gm IV q12h Monitor cxs, renal fxn, clinical improvement, and abx de-escalation as needed  Height: 6\' 1"  (185.4 cm) Weight: 150 lb 2.1 oz (68.1 kg) IBW/kg (Calculated) : 79.9  Temp (24hrs), Avg:97.9 F (36.6 C), Min:97.7 F (36.5 C), Max:98.2 F (36.8 C)  Recent Labs  Lab 01/30/19 1414 01/30/19 1542 01/30/19 1553 01/30/19 2131 01/31/19 0426 02/01/19 1852 02/01/19 2209 02/02/19 0828  WBC  --  18.3*  --  15.5* 17.6* 14.2*  --  15.4*  CREATININE  --  1.40* 1.30*  --  1.59* 1.09  --  0.67  LATICACIDVEN 5.6* 5.0*  --  2.9*  --   --  3.0* 1.5    Estimated Creatinine Clearance: 75.7 mL/min (by C-G formula based on SCr of 0.67 mg/dL).    No Known Allergies  Antimicrobials this admission: 8/16 Vanc >> 8/16 Cefepime >>  Microbiology results: 8/16 BCx sent 8/16 UCx sent  Thank you for allowing pharmacy to be a part of this patient's care.  Gwenlyn Fudge - Student PharmD 02/02/2019 10:21 AM

## 2019-02-02 NOTE — Telephone Encounter (Signed)
"   Things not right ". Wants to speak to [facility].   I LVM to return my call and leave specific questions /concerns.

## 2019-02-02 NOTE — Progress Notes (Addendum)
PROGRESS NOTE    Richard [name]  VPX:[id number] DOB: 1940/08/28 DOA: 01/30/2019 PCP: [name], NP   Brief Narrative: 78 y.o. male with stage III NSCLC started on carboplatin/paclitaxel on January 24, 2019, hypertension, hyperlipidemia, and dementia, poor historian due to dementia, brought to the ED by daughter due to worsening dyspnea -Imaging noted dense consolidation, obstructing mass of the right lung base, acute pulmonary embolism with CT evidence of right heart strain. Large right upper lobe mass with collapse right middle lobe, larger than prior exam -Patient is started on broad-spectrum antibiotics, anticoagulation and admitted [facility] course also complicated by A. fib with RVR and hypotension  Subjective: -Blood pressure low last night, required multiple fluid boluses -Agitated throughout the night, required restraints  Assessment & Plan:   Severe sepsis/post obstructive pneumonia  -Remains on IV vancomycin and cefepime -Blood cultures negative, discontinue vancomycin we will continue cefepime -BP is more stable today  Acute pulmonary embolism/right heart strain -Provoked in the setting of lung cancer, age, debility -Currently on IV heparin will transition to [place] tomorrow, oral intake is more reliable -The echocardiogram notes normal EF, on elevated right RV systolic pressure, RV function is normal  Acute respiratory failure  -Due to large lung mass, acute PE and postobstructive pneumonia  New onset AFib with RVR -long pause, DIg held, -continue IV Heparin -cards following, monitor on tele  AKI: -Improving with hydration   Hypokalemia -Replace  Acute urine retention:  -Started on Flomax, required Foley catheter placement 8/17  Hypercalcemia of malignancy, monitor calcium with hydration.  Last calcium 10.4.  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  Followed by radiation oncology initial consult on 01/26/19 for XRT.  Being planned for return next week for CT simulation for concomitant chemotherapy/radiation therapy. -Anticipate he will be a poor candidate for ongoing therapy with multitude of above problems and dementia  Hypernatremia : With decreased oral intake for several days .  -Continue D5 water today  Non-gap metabolic acidosis with AKI continue hydration.f/u labs. Hypertension: Blood pressure remains stable Hyperlipidemia: Continue Lipitor  Body mass index is 19.81 kg/m.   DVT prophylaxis:Heparin Code Status: DNR Family Communication: Called and updated daughter Disposition Plan: Remains inpatient pending clinical improvement.  Prognosis appears guarded given his baseline dementia now with multiple complex co-morbidities.  Consultants:  Palliative care.  Procedures:  2D echocardiogram: 1. The left ventricle has normal systolic function with an ejection fraction of 60-65%. The cavity size was normal. Left ventricular diastolic Doppler parameters are indeterminate. No evidence of left ventricular regional wall motion abnormalities.  2. The right ventricle has normal systolic function. The cavity was normal. There is no increase in right ventricular wall thickness. Right ventricular systolic pressure is severely elevated (57mmHg)  3. Tricuspid valve regurgitation is moderate-severe.  4. The aorta is normal unless otherwise noted.  5. Tachycardia, sinus, 118bpm.   Microbiology:  Urine culture/Blood Culture pending.  Antimicrobials: Anti-infectives (From admission, onward)   Start     Dose/Rate Route Frequency Ordered Stop   01/31/19 1800  vancomycin (VANCOCIN) IVPB 1000 mg/200 mL premix     1,000 mg 200 mL/hr over 60 Minutes Intravenous Every 24 hours 01/30/19 1611     01/31/19 0600  ceFEPIme (MAXIPIME) 2 g in sodium chloride 0.9 % 100 mL IVPB     2 g 200 mL/hr over 30 Minutes Intravenous Every 12 hours 01/30/19  1611     01/30/19 1600  vancomycin (VANCOCIN) 1,500 mg in sodium chloride 0.9 % 500 mL IVPB     1,500 mg 250 mL/hr over 120 Minutes Intravenous  Once 01/30/19 1538 01/30/19 2038   01/30/19 1600  ceFEPIme (MAXIPIME) 2 g in sodium chloride 0.9 % 100 mL IVPB     2 g 200 mL/hr over 30 Minutes Intravenous  Once 01/30/19 1538 01/30/19 1635     Objective: Vitals:   02/02/19 1019 02/02/19 1119 02/02/19 1156 02/02/19 1213  BP: 106/70 113/60    Pulse: 63 95    Resp: (!) 29 (!) 22    Temp:   98 F (36.7 C)   TempSrc:   Oral   SpO2: 91% 96%  93%  Weight:      Height:        Intake/Output Summary (Last 24 hours) at 02/02/2019 1452 Last data filed at 02/02/2019 1400 Gross per 24 hour  Intake 2398.51 ml  Output 2000 ml  Net 398.51 ml   Filed Weights   01/30/19 1210 01/31/19 0900  Weight: 69.1 kg 68.1 kg   Weight change:   Body mass index is 19.81 kg/m.  Intake/Output from previous day: 08/18 0701 - 08/19 0700 In: 4141.3 [I.V.:1663.8; IV Piggyback:1777.4] Out: 1500 [Urine:1500] Intake/Output this shift: Total I/O In: 788 [P.O.:180; I.V.:451.1; IV Piggyback:156.8] Out: 500 [Urine:500]  Examination:  Gen: Early male, chronically ill-appearing, confused agitated, not following commands HEENT: PERRLA, Neck supple, no JVD Lungs: Few rhonchi in right middle lobe and right lower lobe CVS: S1-S2/regular rate  abd: soft, Non tender, non distended, BS present Extremities: No edema Skin: no new rashes Psych, pleasantly confused, poor insight and judgment  Medications:  Scheduled Meds: . atorvastatin  20 mg Oral QPM  . docusate sodium  200 mg Oral QHS  . potassium chloride  40 mEq Oral TID  . tamsulosin  0.4 mg Oral QHS   Continuous Infusions: . sodium chloride Stopped (02/02/19 1003)  . ceFEPime (MAXIPIME) IV Stopped (02/02/19 6295)  . dextrose 50 mL/hr at 02/02/19 1019  . heparin 1,050 Units/hr (02/02/19 1019)  . vancomycin Stopped (02/01/19 1943)    Data Reviewed: I  have personally reviewed following labs and imaging studies  CBC: Recent Labs  Lab 01/30/19 1542 01/30/19 1553 01/30/19 2131 01/31/19 0426 02/01/19 1852 02/02/19 0828  WBC 18.3*  --  15.5* 17.6* 14.2* 15.4*  NEUTROABS 14.0*  --   --   --   --   --   HGB 13.0 13.3 11.7* 11.6* 10.5* 10.2*  HCT 41.6 39.0 35.9* 36.6* 33.7* 32.2*  MCV 65.4*  --  62.7* 63.9* 65.2* 64.0*  PLT 212  --  186 205 185 284   Basic Metabolic Panel: Recent Labs  Lab 01/30/19 1542 01/30/19 1553 01/31/19 0426 02/01/19 1852 02/02/19 0828  NA 147* 147* 155* 154* 151*  K 4.5 4.2 4.4 2.9* 2.9*  CL 110  --  123* 124* 120*  CO2 19*  --  21* 19* 24  GLUCOSE 114*  --  99 136* 99  BUN 50*  --  45* 20 11  CREATININE 1.40* 1.30* 1.59* 1.09 0.67  CALCIUM 11.5*  --  10.4* 9.3 8.9   GFR: Estimated Creatinine Clearance: 75.7 mL/min (by C-G formula based on SCr of 0.67 mg/dL). Liver Function Tests: Recent Labs  Lab 01/30/19 1542 01/31/19 0426  AST 21 22  ALT 19 19  ALKPHOS 81 72  BILITOT 0.9 0.7  PROT 7.0 6.4*  ALBUMIN 2.5* 2.4*   No results for input(s):  LIPASE, AMYLASE in the last 168 hours. No results for input(s): AMMONIA in the last 168 hours. Coagulation Profile: Recent Labs  Lab 01/30/19 1542  INR 1.2   Cardiac Enzymes: No results for input(s): CKTOTAL, CKMB, CKMBINDEX, TROPONINI in the last 168 hours. BNP (last 3 results) No results for input(s): PROBNP in the last 8760 hours. HbA1C: No results for input(s): HGBA1C in the last 72 hours. CBG: Recent Labs  Lab 02/02/19 0021  GLUCAP 105*   Lipid Profile: No results for input(s): CHOL, HDL, LDLCALC, TRIG, CHOLHDL, LDLDIRECT in the last 72 hours. Thyroid Function Tests: No results for input(s): TSH, T4TOTAL, FREET4, T3FREE, THYROIDAB in the last 72 hours. Anemia Panel: No results for input(s): VITAMINB12, FOLATE, FERRITIN, TIBC, IRON, RETICCTPCT in the last 72 hours. Sepsis Labs: Recent Labs  Lab 01/30/19 1542 01/30/19 2131 02/01/19  2209 02/02/19 0828  LATICACIDVEN 5.0* 2.9* 3.0* 1.5    Recent Results (from the past 240 hour(s))  SARS Coronavirus 2 [facility] order, Performed in [facility] hospital lab) Nasopharyngeal Nasopharyngeal Swab     Status: None   Collection Time: 01/30/19  1:17 PM   Specimen: Nasopharyngeal Swab  Result Value Ref Range Status   SARS Coronavirus 2 NEGATIVE NEGATIVE Final    Comment: (NOTE) If result is NEGATIVE SARS-CoV-2 target nucleic acids are NOT DETECTED. The SARS-CoV-2 RNA is generally detectable in upper and lower  respiratory specimens during the acute phase of infection. The lowest  concentration of SARS-CoV-2 viral copies this assay can detect is 250  copies / mL. A negative result does not preclude SARS-CoV-2 infection  and should not be used as the sole basis for treatment or other  patient management decisions.  A negative result may occur with  improper specimen collection / handling, submission of specimen other  than nasopharyngeal swab, presence of viral mutation(s) within the  areas targeted by this assay, and inadequate number of viral copies  (<250 copies / mL). A negative result must be combined with clinical  observations, patient history, and epidemiological information. If result is POSITIVE SARS-CoV-2 target nucleic acids are DETECTED. The SARS-CoV-2 RNA is generally detectable in upper and lower  respiratory specimens dur ing the acute phase of infection.  Positive  results are indicative of active infection with SARS-CoV-2.  Clinical  correlation with patient history and other diagnostic information is  necessary to determine patient infection status.  Positive results do  not rule out bacterial infection or co-infection with other viruses. If result is PRESUMPTIVE POSTIVE SARS-CoV-2 nucleic acids MAY BE PRESENT.   A presumptive positive result was obtained on the submitted specimen  and confirmed on repeat testing.  While 2019 novel coronavirus   (SARS-CoV-2) nucleic acids may be present in the submitted sample  additional confirmatory testing may be necessary for epidemiological  and / or clinical management purposes  to differentiate between  SARS-CoV-2 and other Sarbecovirus currently known to infect humans.  If clinically indicated additional testing with an alternate test  methodology [phone]) is advised. The SARS-CoV-2 RNA is generally  detectable in upper and lower respiratory sp ecimens during the acute  phase of infection. The expected result is Negative. Fact Sheet for Patients:  StrictlyIdeas.no Fact Sheet for Healthcare Providers: BankingDealers.co.za This test is not yet approved or cleared by the [place] FDA and has been authorized for detection and/or diagnosis of SARS-CoV-2 by FDA under an Emergency Use Authorization (EUA).  This EUA will remain in effect (meaning this test can be used) for the  duration of the COVID-19 declaration under Section 564(b)(1) of the Act, 21 U.S.C. section 360bbb-3(b)(1), unless the authorization is terminated or revoked sooner. Performed at [facility], [place] [address] [place], [place] [address]   Urine culture     Status: Abnormal   Collection Time: 01/30/19  1:25 PM   Specimen: Urine, Random  Result Value Ref Range Status   Specimen Description URINE, RANDOM  Final   Special Requests   Final    NONE Performed at [facility], [place] [address] [place], [place] [address]    Culture (A)  Final    20,000 COLONIES/mL MULTIPLE SPECIES PRESENT, SUGGEST RECOLLECTION   Report Status 01/31/2019 FINAL  Final  Blood culture (routine x 2)     Status: None (Preliminary result)   Collection Time: 01/30/19  3:59 PM   Specimen: BLOOD  Result Value Ref Range Status   Specimen Description BLOOD RIGHT ANTECUBITAL  Final   Special Requests   Final    BOTTLES DRAWN AEROBIC AND ANAEROBIC Blood Culture results may not be optimal  due to an inadequate volume of blood received in culture bottles   Culture   Final    NO GROWTH 3 DAYS Performed at [facility], [place] [address] [place], [place] [address]    Report Status PENDING  Incomplete  Blood culture (routine x 2)     Status: None (Preliminary result)   Collection Time: 01/30/19  4:04 PM   Specimen: BLOOD  Result Value Ref Range Status   Specimen Description BLOOD LEFT ANTECUBITAL  Final   Special Requests   Final    BOTTLES DRAWN AEROBIC AND ANAEROBIC Blood Culture results may not be optimal due to an inadequate volume of blood received in culture bottles   Culture   Final    NO GROWTH 3 DAYS Performed at [facility], [place] [address] [place], [place] [address]    Report Status PENDING  Incomplete  MRSA PCR Screening     Status: None   Collection Time: 01/31/19  6:18 PM   Specimen: Nasal Mucosa; Nasopharyngeal  Result Value Ref Range Status   MRSA by PCR NEGATIVE NEGATIVE Final    Comment:        The GeneXpert MRSA Assay (FDA approved for NASAL specimens only), is one component of a comprehensive MRSA colonization surveillance program. It is not intended to diagnose MRSA infection nor to guide or monitor treatment for MRSA infections. Performed at [facility], [place] [address], [place], [place] [address]       Radiology Studies: Dg Chest 1 View  Result Date: 02/01/2019 CLINICAL DATA:  Shortness of breath, altered mental status EXAM: CHEST  1 VIEW COMPARISON:  01/30/2019 FINDINGS: Consolidation in the right lower lung is stable. Underlying COPD. Left lung clear. Heart is normal size. IMPRESSION: Stable right basilar atelectasis or scar site stable right basilar infiltrate/consolidation. Electronically Signed   By: Rolm Baptise M.D.   On: 02/01/2019 08:33      LOS: 3 days   Time spent: 101min More than 50% of that time was spent in counseling and/or coordination of care.  Domenic Polite, MD [facility]  02/02/2019, 2:52  PM

## 2019-02-02 NOTE — Progress Notes (Signed)
1425: Page to MD. [facility] requesting call. Patient has been compliant with care, though refuses bath and foley care at this time. Will try again later.

## 2019-02-02 NOTE — Telephone Encounter (Addendum)
Pt still in hospital- I answered brothers question about pt stage and told him that tx decision will be made after Julien Nordmann sees him aug 31.   I told him pt needs to keep appt on aug 31st and to let us know if this needs to change.

## 2019-02-02 NOTE — Progress Notes (Addendum)
Progress Note  Patient Name: Richard [name] Date of Encounter: 02/02/2019  Primary Cardiologist: Dr. Marlou Porch, MD   Subjective   Converted to NSR overnight. Has some nocturnal bradycardia and pausing. Denies pain   Inpatient Medications    Scheduled Meds: . atorvastatin  20 mg Oral QPM  . docusate sodium  200 mg Oral QHS  . tamsulosin  0.4 mg Oral QHS   Continuous Infusions: . sodium chloride 500 mL (02/02/19 0707)  . ceFEPime (MAXIPIME) IV 2 g (02/02/19 0708)  . dextrose 50 mL/hr at 02/02/19 0153  . heparin 1,050 Units/hr (02/01/19 2034)  . vancomycin 200 mL/hr at 02/01/19 1926   PRN Meds: sodium chloride, acetaminophen **OR** acetaminophen, metoprolol tartrate   Vital Signs    Vitals:   02/02/19 0431 02/02/19 0500 02/02/19 0639 02/02/19 0649  BP: 125/73 108/66 (!) 103/55 (!) 101/55  Pulse: 97 73 90 68  Resp: (!) 31 (!) 22 (!) 26 20  Temp:      TempSrc:      SpO2: 99% 100% 96% 98%  Weight:      Height:        Intake/Output Summary (Last 24 hours) at 02/02/2019 0745 Last data filed at 02/02/2019 0651 Gross per 24 hour  Intake 4141.25 ml  Output 1500 ml  Net 2641.25 ml   Filed Weights   01/30/19 1210 01/31/19 0900  Weight: 69.1 kg 68.1 kg    Physical Exam   General: Elderly, frail, NAD Skin: Warm, dry, intact  Neck: Negative for carotid bruits. No JVD Lungs: Diminished bilaterally. Breathing is unlabored. Cardiovascular: RRR with S1 S2. Abdomen: Soft, non-tender, non-distended. Extremities: No edema.  Neuro: Alert and oriented to person only Psych: Responds to questions appropriately   Labs    Chemistry Recent Labs  Lab 01/30/19 1542 01/30/19 1553 01/31/19 0426 02/01/19 1852  NA 147* 147* 155* 154*  K 4.5 4.2 4.4 2.9*  CL 110  --  123* 124*  CO2 19*  --  21* 19*  GLUCOSE 114*  --  99 136*  BUN 50*  --  45* 20  CREATININE 1.40* 1.30* 1.59* 1.09  CALCIUM 11.5*  --  10.4* 9.3  PROT 7.0  --  6.4*  --   ALBUMIN 2.5*  --  2.4*  --   AST  21  --  22  --   ALT 19  --  19  --   ALKPHOS 81  --  72  --   BILITOT 0.9  --  0.7  --   GFRNONAA 48*  --  42* >60  GFRAA 56*  --  48* >60  ANIONGAP 18*  --  11 11     Hematology Recent Labs  Lab 01/30/19 2131 01/31/19 0426 02/01/19 1852  WBC 15.5* 17.6* 14.2*  RBC 5.73 5.73 5.17  HGB 11.7* 11.6* 10.5*  HCT 35.9* 36.6* 33.7*  MCV 62.7* 63.9* 65.2*  MCH 20.4* 20.2* 20.3*  MCHC 32.6 31.7 31.2  RDW 17.2* 17.4* 17.5*  PLT 186 205 185    Cardiac EnzymesNo results for input(s): TROPONINI in the last 168 hours. No results for input(s): TROPIPOC in the last 168 hours.   BNPNo results for input(s): BNP, PROBNP in the last 168 hours.   DDimer No results for input(s): DDIMER in the last 168 hours.   Radiology    Dg Chest 1 View  Result Date: 02/01/2019 CLINICAL DATA:  Shortness of breath, altered mental status EXAM: CHEST  1 VIEW COMPARISON:  01/30/2019 FINDINGS: Consolidation in the right lower lung is stable. Underlying COPD. Left lung clear. Heart is normal size. IMPRESSION: Stable right basilar atelectasis or scar site stable right basilar infiltrate/consolidation. Electronically Signed   By: Rolm Baptise M.D.   On: 02/01/2019 08:33   Vas [place] Lower Extremity Venous (dvt)  Result Date: 01/31/2019  Lower Venous Study Indications: Pulmonary embolism.  Risk Factors: Confirmed PE. Limitations: Poor ultrasound/tissue interface and patient immobility, poor patient cooperation, combative. Comparison Study: No prior studies. Performing Technologist: Oliver Hum RVT  Examination Guidelines: A complete evaluation includes B-mode imaging, spectral Doppler, color Doppler, and power Doppler as needed of all accessible portions of each vessel. Bilateral testing is considered an integral part of a complete examination. Limited examinations for reoccurring indications may be performed as noted.  +---------+---------------+---------+-----------+----------+--------------+ RIGHT     CompressibilityPhasicitySpontaneityPropertiesSummary        +---------+---------------+---------+-----------+----------+--------------+ CFV      Partial        Yes      Yes                  Acute          +---------+---------------+---------+-----------+----------+--------------+ SFJ      Full                                                        +---------+---------------+---------+-----------+----------+--------------+ FV Prox  Full                                                        +---------+---------------+---------+-----------+----------+--------------+ FV Mid   Full                                                        +---------+---------------+---------+-----------+----------+--------------+ FV DistalFull                                                        +---------+---------------+---------+-----------+----------+--------------+ PFV      Full                                                        +---------+---------------+---------+-----------+----------+--------------+ POP      Full           No       No                                  +---------+---------------+---------+-----------+----------+--------------+ PTV      Full                                                        +---------+---------------+---------+-----------+----------+--------------+  PERO     Full                                                        +---------+---------------+---------+-----------+----------+--------------+ EIV      Full           Yes      Yes                                 +---------+---------------+---------+-----------+----------+--------------+ CIV                                                   Not visualized +---------+---------------+---------+-----------+----------+--------------+ Unable to visualize the common iliac vein due to patient cooperation, and combativeness.   +---------+---------------+---------+-----------+----------+-------+ LEFT     CompressibilityPhasicitySpontaneityPropertiesSummary +---------+---------------+---------+-----------+----------+-------+ CFV      Full           Yes      Yes                          +---------+---------------+---------+-----------+----------+-------+ SFJ      Full                                                 +---------+---------------+---------+-----------+----------+-------+ FV Prox  Full                                                 +---------+---------------+---------+-----------+----------+-------+ FV Mid   Full                                                 +---------+---------------+---------+-----------+----------+-------+ FV DistalFull                                                 +---------+---------------+---------+-----------+----------+-------+ PFV      Full                                                 +---------+---------------+---------+-----------+----------+-------+ POP      Full           Yes      Yes                          +---------+---------------+---------+-----------+----------+-------+ PTV      Full                                                 +---------+---------------+---------+-----------+----------+-------+  PERO     Full                                                 +---------+---------------+---------+-----------+----------+-------+     Summary: Right: Findings consistent with acute deep vein thrombosis involving the right common femoral vein. No cystic structure found in the popliteal fossa. Left: There is no evidence of deep vein thrombosis in the lower extremity. No cystic structure found in the popliteal fossa.  *See table(s) above for measurements and observations. Electronically signed by Servando Snare MD on 01/31/2019 at 2:16:43 PM.    Final    Telemetry    02/02/2019 NSR with rates in the 50's overnight and in the  90's this AM - Personally Reviewed  ECG     No new tracing as of 02/02/2019- Personally Reviewed  Cardiac Studies   ECHO: 02/01/2019  1. The left ventricle has normal systolic function with an ejection fraction of 60-65%. The cavity size was normal. Left ventricular diastolic Doppler parameters are indeterminate. No evidence of left ventricular regional wall motion abnormalities. 2. The right ventricle has normal systolic function. The cavity was normal. There is no increase in right ventricular wall thickness. Right ventricular systolic pressure is severely elevated (26mmHg) 3. Tricuspid valve regurgitation is moderate-severe. 4. The aorta is normal unless otherwise noted. 5. Tachycardia, sinus, 118bpm.  FINDINGS Left Ventricle: The left ventricle has normal systolic function, with an ejection fraction of 60-65%. The cavity size was normal. There is no increase in left ventricular wall thickness. Left ventricular diastolic Doppler parameters are indeterminate.  No evidence of left ventricular regional wall motion abnormalities..  Patient Profile     78 y.o. male with a hx of non-small cell lung carcinoma stage III treated with carboplatin/paclitaxel, hypertension, hyperlipidemia and advanced dementia who is being seen today for the evaluation of new onset atrial fibrillation with RVR at the request of Dr. Lupita Leash.  Assessment & Plan    1.  New onset atrial fibrillation with RVR: -Pt presented with worsening SOB found to have an acute PE/DVT per CTA >> was placed on IV Hep gtt.  -On 02/01/2019 pt was found have new onset atrial fibrillation with RVR -Anticoagulated with IV Hep as above -Due to bradycardia and 4.8sec pausing overnight, Dig was not given  -Plan to continue IV Hep and monitor HR for now  -CHA2DS2VASc =5 (age, HTN, thromboembolism)  2.  Severe sepsis secondary to pneumonia in the setting of stage III non-small cell lung carcinoma: -Recent treatment for lung CA  with carboplatin/paclitaxel>>now with suspicion for small liver mets -Follows with Dr. Isabella Stalling consult was 01/26/2019 for radiation therapy with plans for return for CT simulation for concomitant chemotherapy/radiation therapy.  -Palliative car has been consulted, poor prognosis>>>now DNR   3.  Acute pulmonary embolism with acute hypoxic failure: -Acute PE found on CTA on arrival in the setting of lung CA >>now treated with IV Hep>>likely will transition to NOAC once ok with IM team  -Follow up echocardiogram to evaluate for RV strain  -Also with DVT in the right leg per BLE duplex   4. AKI: -Creatinine, pending this AM  -Baseline appears to be in the 0.8 range   5. Severe sepsis secondary to PNA: -Treated with vancomycin/cefepime -Continue IV fluid hydration and follow up blood cultures  -Per primary team     Signed,  Kathyrn Drown NP-C [facility] Pager: [phone] 02/02/2019, 7:45 AM     For questions or updates, please contact   Please consult [email or web address] for contact info under Cardiology/STEMI.  Patient seen and examined.  Agree with above documentation.  Patient is a 78 y.o. male with a hx of non-small cell lung carcinoma stage III who presented with sepsis 2/2 pneumonia and an acute PE.  Yesterday he went into AF with RVR.  Subsequently converted to sinus rhythm and had a 4.8 second sinus pause.  He denies any symptoms today.  On exam he is oriented to person only,  lungs are clear to auscultation, and cardiac exam is regular rate and rhythm, no murmurs.  For his AF, it is likely driven by his acute illness.  Would not start any rate-controlling medications at this time given his significant sinus pause.  Could consider an oral amiodarone load if AF recurrent to try and maintain sinus rhythm, but could cause worsening bradycardia.  In regards to his sinus pause, he is not a good candidate for a pacemaker given his comorbidities; would continue to monitor on  telemetry.

## 2019-02-02 NOTE — Progress Notes (Addendum)
ANTICOAGULATION CONSULT NOTE - Follow Up Consult  Pharmacy Consult for Heparin Indication: pulmonary embolus/DVT  No Known Allergies  Patient Measurements: Height: 6\' 1"  (185.4 cm) Weight: 150 lb 2.1 oz (68.1 kg) IBW/kg (Calculated) : 79.9 Heparin Dosing Weight: 68.1 kg  Vital Signs: Temp: 98.2 F (36.8 C) (08/19 0748) Temp Source: Oral (08/19 0748) BP: 101/55 (08/19 0649) Pulse Rate: 68 (08/19 0649)  Labs: Recent Labs    01/30/19 1542  01/31/19 0426  01/31/19 2200 02/01/19 1852 02/02/19 0828  HGB 13.0   < > 11.6*  --   --  10.5* 10.2*  HCT 41.6   < > 36.6*  --   --  33.7* 32.2*  PLT 212   < > 205  --   --  185 203  LABPROT 14.7  --   --   --   --   --   --   INR 1.2  --   --   --   --   --   --   HEPARINUNFRC  --   --  0.96*   < > 0.53 0.29* 0.34  CREATININE 1.40*   < > 1.59*  --   --  1.09 0.67   < > = values in this interval not displayed.    Estimated Creatinine Clearance: 75.7 mL/min (by C-G formula based on SCr of 0.67 mg/dL).   Medications:  Medications Prior to Admission  Medication Sig Dispense Refill Last Dose  . aspirin (ASPIRIN 81) 81 MG EC tablet Take 1 tablet (81 mg total) by mouth daily. 90 tablet 3 01/29/2019 at Unknown time  . atorvastatin (LIPITOR) 20 MG tablet Take 1 tablet (20 mg total) by mouth every evening. 90 tablet 1 01/29/2019 at Unknown time  . Omega-3 Fatty Acids (FISH OIL PO) Take 1 capsule by mouth daily.   01/29/2019 at Unknown time  . HYDROcodone-acetaminophen (NORCO/VICODIN) 5-325 MG tablet Take 1 tablet by mouth every 4 (four) hours as needed for moderate pain. (Patient not taking: Reported on 01/30/2019) 12 tablet 0 Not Taking at Unknown time  . nicotine (NICODERM CQ - DOSED IN MG/24 HR) 7 mg/24hr patch Place 1 patch (7 mg total) onto the skin daily. (Patient not taking: Reported on 12/31/2018) 28 patch 0   . prochlorperazine (COMPAZINE) 10 MG tablet Take 1 tablet (10 mg total) by mouth every 6 (six) hours as needed for nausea or  vomiting. 30 tablet 0     Assessment: 86 yom presenting with acute bilateral PE, moderate clot burden with RHS. Pharmacy consulted to dose heparin. Not on anticoagulation PTA. No active bleed issues documented - did report coffee-ground emesis and concern for BRBPR on 8/16 AM. Initially given Protonix bolus but not continued as EDP has low suspicion for GIB.  Venous duplex bilateral LE done 01/31/19 :  Positive for RLE DVT  Heparin level therapeutic at 0.34. There were some short pauses for the heparin drip documented in the [facility] 8/18, but night shift RN was not informed of any issues with the infusion. RN noted some blood in the urine 8/18 AM and would call back if this worsens.   Of note, patient has been refusing blood draws. Pt removed own Foley 8/17, scant bleeding at site, hep was paused, then resumed after reinsertion  Hgb 10.2, plt 203. No acute bleeding reported  Goal of Therapy:  Heparin level 0.3-0.7 units/ml Monitor platelets by anticoagulation protocol: Yes   Plan:  Continue heparin drip at 1050 units/hr Daily heparin level, CBC  Monitor s/sx of bleeding  Gwenlyn Fudge - Student PharmD 02/02/2019       10:34 AM

## 2019-02-02 NOTE — Plan of Care (Signed)
  Problem: Nutrition: Goal: Adequate nutrition will be maintained 02/02/2019 1859 by Tenna Delaine, RN Outcome: Not Progressing Note: Poor appetite. PO encouraged.

## 2019-02-03 DIAGNOSIS — I4891 Unspecified atrial fibrillation: Secondary | ICD-10-CM

## 2019-02-03 LAB — BASIC METABOLIC PANEL
Anion gap: 9 (ref 5–15)
BUN: 9 mg/dL (ref 8–23)
CO2: 22 mmol/L (ref 22–32)
Calcium: 8.9 mg/dL (ref 8.9–10.3)
Chloride: 115 mmol/L — ABNORMAL HIGH (ref 98–111)
Creatinine, Ser: 0.74 mg/dL (ref 0.61–1.24)
GFR calc Af Amer: >60 mL/min (ref >60)
GFR calc non Af Amer: >60 mL/min (ref >60)
Glucose, Bld: 100 mg/dL — ABNORMAL HIGH (ref 70–99)
Potassium: 3.9 mmol/L (ref 3.5–5.1)
Sodium: 146 mmol/L — ABNORMAL HIGH (ref 135–145)

## 2019-02-03 LAB — CBC
HCT: 34.8 % — ABNORMAL LOW (ref 39.0–52.0)
Hemoglobin: 11 g/dL — ABNORMAL LOW (ref 13.0–17.0)
MCH: 20.1 pg — ABNORMAL LOW (ref 26.0–34.0)
MCHC: 31.6 g/dL (ref 30.0–36.0)
MCV: 63.5 fL — ABNORMAL LOW (ref 80.0–100.0)
Platelets: 210 10*3/uL (ref 150–400)
RBC: 5.48 MIL/uL (ref 4.22–5.81)
RDW: 18.3 % — ABNORMAL HIGH (ref 11.5–15.5)
WBC: 14.6 10*3/uL — ABNORMAL HIGH (ref 4.0–10.5)
nRBC: 2.1 % — ABNORMAL HIGH (ref 0.0–0.2)

## 2019-02-03 LAB — HEPARIN LEVEL (UNFRACTIONATED): Heparin Unfractionated: 0.31 IU/mL (ref 0.30–0.70)

## 2019-02-03 MED ORDER — SODIUM CHLORIDE 0.9 % IV BOLUS
250.0000 mL | Freq: Once | INTRAVENOUS | Status: AC
  Administered 2019-02-03: 250 mL via INTRAVENOUS

## 2019-02-03 MED ORDER — SODIUM CHLORIDE 0.9 % IV SOLN
2.0000 g | Freq: Three times a day (TID) | INTRAVENOUS | Status: DC
  Administered 2019-02-03 – 2019-02-04 (×3): 2 g via INTRAVENOUS
  Filled 2019-02-03 (×3): qty 2

## 2019-02-03 MED ORDER — DEXTROSE 5 % IV SOLN
INTRAVENOUS | Status: AC
  Administered 2019-02-03: 11:00:00 via INTRAVENOUS

## 2019-02-03 MED ORDER — RIVAROXABAN 20 MG PO TABS: 20.0000 mg | ORAL_TABLET | Freq: Every day | ORAL | Status: DC

## 2019-02-03 MED ORDER — RIVAROXABAN 15 MG PO TABS
15.0000 mg | ORAL_TABLET | Freq: Two times a day (BID) | ORAL | Status: DC
  Administered 2019-02-03 – 2019-02-11 (×15): 15 mg via ORAL
  Filled 2019-02-03 (×17): qty 1

## 2019-02-03 MED ORDER — MIDODRINE HCL 5 MG PO TABS
5.0000 mg | ORAL_TABLET | Freq: Two times a day (BID) | ORAL | Status: DC
  Administered 2019-02-03 – 2019-02-11 (×14): 5 mg via ORAL
  Filled 2019-02-03 (×16): qty 1

## 2019-02-03 NOTE — Progress Notes (Signed)
Adminn 250cc NS bolus and 5mg  midodrine , pt BP came up to 104/68. Will continue to monitor.

## 2019-02-03 NOTE — Progress Notes (Signed)
Manual BP 86/52. Awaiting orders. Will continue to monitor.

## 2019-02-03 NOTE — Progress Notes (Addendum)
Progress Note  Patient Name: Richard [name] Date of Encounter: 02/03/2019  Primary Cardiologist: Candee Furbish, MD  Subjective   Patient is abrasive, confused, confabulating. Reports nobody is talking to him (even while we are talking to him). Does not know where he is. Initally declined examination. BP still running soft despite fluid boluses. Has required restraints for agitation.  Inpatient Medications    Scheduled Meds: . atorvastatin  20 mg Oral QPM  . docusate sodium  200 mg Oral QHS  . midodrine  5 mg Oral BID WC  . rivaroxaban  15 mg Oral BID   Followed by  . [START ON 02/24/2019] rivaroxaban  20 mg Oral Q supper  . tamsulosin  0.4 mg Oral QHS   Continuous Infusions: . sodium chloride Stopped (02/02/19 1003)  . ceFEPime (MAXIPIME) IV    . dextrose     PRN Meds: sodium chloride, acetaminophen **OR** acetaminophen, metoprolol tartrate   Vital Signs    Vitals:   02/03/19 0549 02/03/19 0834 02/03/19 0843 02/03/19 0854  BP:  (!) 89/50 (!) 99/54 (!) 86/52  Pulse: 88 99    Resp: (!) 27  (!) 30 (!) 21  Temp:  98.1 F (36.7 C)    TempSrc:  Axillary    SpO2: 96% 100%    Weight:      Height:        Intake/Output Summary (Last 24 hours) at 02/03/2019 0933 Last data filed at 02/03/2019 0124 Gross per 24 hour  Intake 1297.83 ml  Output 1850 ml  Net -552.17 ml   Last 3 Weights 01/31/2019 01/30/2019 01/12/2019  Weight (lbs) 150 lb 2.1 oz 152 lb 5.4 oz 152 lb 4.8 oz  Weight (kg) 68.1 kg 69.1 kg 69.083 kg     Telemetry    Sinus tach with brief runs possible AF- Personally Reviewed  Physical Exam   GEN: No acute distress, lying flat in bed, appears chronically ill and malnourished HEENT: Normocephalic, atraumatic, sclera non-icteric. Neck: No JVD or bruits. Cardiac: Tachycardic, regular rate, no murmurs, rubs, or gallops.  Radials/DP/PT 1+ and equal bilaterally.  Respiratory: Clear to auscultation bilaterally. Breathing is unlabored. GI: Soft, nontender,  non-distended, BS +x 4. MS: no deformity. Extremities: No clubbing or cyanosis. No edema. Distal pedal pulses are 2+ and equal bilaterally. Neuro: Confused. Answer simple questions Psych: Labile affect  Labs    High Sensitivity Troponin:  No results for input(s): TROPONINIHS in the last 720 hours.    Cardiac EnzymesNo results for input(s): TROPONINI in the last 168 hours. No results for input(s): TROPIPOC in the last 168 hours.   Chemistry Recent Labs  Lab 01/30/19 1542  01/31/19 0426 02/01/19 1852 02/02/19 0828 02/03/19 0242  NA 147*   < > 155* 154* 151* 146*  K 4.5   < > 4.4 2.9* 2.9* 3.9  CL 110  --  123* 124* 120* 115*  CO2 19*  --  21* 19* 24 22  GLUCOSE 114*  --  99 136* 99 100*  BUN 50*  --  45* 20 11 9   CREATININE 1.40*   < > 1.59* 1.09 0.67 0.74  CALCIUM 11.5*  --  10.4* 9.3 8.9 8.9  PROT 7.0  --  6.4*  --   --   --   ALBUMIN 2.5*  --  2.4*  --   --   --   AST 21  --  22  --   --   --   ALT 19  --  19  --   --   --   ALKPHOS 81  --  72  --   --   --   BILITOT 0.9  --  0.7  --   --   --   GFRNONAA 48*  --  42* >60 >60 >60  GFRAA 56*  --  48* >60 >60 >60  ANIONGAP 18*  --  11 11 7 9    < > = values in this interval not displayed.     Hematology Recent Labs  Lab 02/01/19 1852 02/02/19 0828 02/03/19 0242  WBC 14.2* 15.4* 14.6*  RBC 5.17 5.03 5.48  HGB 10.5* 10.2* 11.0*  HCT 33.7* 32.2* 34.8*  MCV 65.2* 64.0* 63.5*  MCH 20.3* 20.3* 20.1*  MCHC 31.2 31.7 31.6  RDW 17.5* 17.5* 18.3*  PLT 185 203 210    BNPNo results for input(s): BNP, PROBNP in the last 168 hours.   DDimer No results for input(s): DDIMER in the last 168 hours.   Radiology    No results found.  Cardiac Studies   2D echo 01/31/19 IMPRESSIONS    1. The left ventricle has normal systolic function with an ejection fraction of 60-65%. The cavity size was normal. Left ventricular diastolic Doppler parameters are indeterminate. No evidence of left ventricular regional wall motion  abnormalities.  2. The right ventricle has normal systolic function. The cavity was normal. There is no increase in right ventricular wall thickness. Right ventricular systolic pressure is severely elevated (47mmHg)  3. Tricuspid valve regurgitation is moderate-severe.  4. The aorta is normal unless otherwise noted.  5. Tachycardia, sinus, 118bpm.  Patient Profile     78 y.o. male with non-small cell lung carcinoma treated with carboplatin / paclitaxel, hypertension, hyperlipidemia and advanced dementia who was admitted with severe sepsis with PNA in setting of large lung mass, lactic acidosis, aucte PE, right heart strain, DVT, and acute hypoxic respiratory failure.  Assessment & Plan    1. Severe sepsis secondary to post obstructive PNA in setting of non-small cell lung cancer, now with suspicion for small liver mets - palliative care has been consulted, prognosis felt to be poor per notes. Remains hypotensive. Given normal LVEF and normal sinus rhythm, this is not likely to be cardiogenic in etiology. IM is planning addition of midodrine.  2. Acute PE with acute hypoxic respiratory failure and RLE DVT - while anticoagulation with Lovenox may be more appropriate given malignancy, doubt he would be able to comply with this. Per d/w Dr. Broadus John, the plan is for Xarelto - pharmacy to dose given VTE.   3. Severe pulm HTN with moderate-severe TR likely due to #2, as well as lung mass.  4. New onset atrial fib RVR, with subsequent bradycardia - now off AVN blocking agents. This is likely reactive to the severe illness otherwise affecting his clinical state. He has a high likelihood of recurrence given that some of his medical issues are not reversible. Will check TSH/free T4 in AM for thoroughness. He has had some degree of tachy-brady this admission with a 4.8 sec pause reported. Therefore, need to be cautious with AVN blocking agents. He is not a candidate for PPM nor does it appear acutely clinically  indicated. Amiodarone could be considered (had 2 brief runs since last round) but in the overall picture it does not appear that treating his atrial fib will really affect long term prognosis. Will plan to review with Dr. Gardiner Rhyme.  [facility] will sign off.  Medication Recommendations:  None Other recommendations (labs, testing, etc): None Follow up as an outpatient:  As needed.   For questions or updates, please contact [place] Please consult [email or web address] for contact info under Cardiology/STEMI.  Signed, Charlie Pitter, PA-C 02/03/2019, 9:33 AM     Patient seen and examined.  Agree with above documentation.  Mr Flannery denies any chest pain or dyspnea this morning.  On exam, he is oriented to person only, lungs are CTAB, no JVD or LE edema, cardiac exam notable for tachycardia but regular rate and no murmur.    For his AF, I would favor holding off on amio load given significant sinus pause.  Could revisit if AF becomes more of a burden.  Will sign off.  Please call if any further issues.  Donato Heinz, MD

## 2019-02-03 NOTE — Progress Notes (Signed)
ANTICOAGULATION CONSULT NOTE - Initial Consult  Pharmacy Consult for Xarelto Indication: DVT, PE  No Known Allergies  Patient Measurements: Height: 6\' 1"  (185.4 cm) Weight: 150 lb 2.1 oz (68.1 kg) IBW/kg (Calculated) : 79.9  Vital Signs: Temp: 98.1 F (36.7 C) (08/20 0834) Temp Source: Axillary (08/20 0834) BP: 86/52 (08/20 0854) Pulse Rate: 99 (08/20 0834)  Labs: Recent Labs    02/01/19 1852 02/02/19 0828 02/03/19 0242  HGB 10.5* 10.2* 11.0*  HCT 33.7* 32.2* 34.8*  PLT 185 203 210  HEPARINUNFRC 0.29* 0.34 0.31  CREATININE 1.09 0.67 0.74    Estimated Creatinine Clearance: 75.7 mL/min (by C-G formula based on SCr of 0.74 mg/dL).   Medical History: Past Medical History:  Diagnosis Date  . Dementia ([place])   . High cholesterol   . Hypertension   . Lung mass 12/2018    Assessment:  Anticoag: acute bilateral PE, moderate clot burden with RHS and  + RLE DVT + new afib >> IV hep >Xarelto 8/20. - No anticoagulation PTA. - Previously coffee-ground emesis and concern for BRBPR  - 8/16AM. Initially given Protonix bolus but not continued as EDP has low suspicion for GIB. - 8/17 hematuria, BLE venous duplex : + RLE DVT - 8/18 4:55 AM RN reported small amt of blood from foley due to pt ripped out foley himself. He refused HL lab draw this AM.  Heparin stopped >> foley replaced and RN reported hep gtt restarted 0600 at 1000 ut/hr, scant amount blood noted 2/2 trauma from pulling foley out.  No blood in urine noted since 8/18 AM, no acute bleeding reported - Hgb 10.2>>1. Plts 210 WNL  Goal of Therapy:  Therapeutic oral anticoagulation   Plan:  D/c IV heparin. Xarelto 15mg  BID x 21d, then 20mg  daily   Richard [name], PharmD, BCPS Clinical Staff Pharmacist Eilene Ghazi Stillinger 02/03/2019,9:43 AM

## 2019-02-03 NOTE — Progress Notes (Signed)
ANTICOAGULATION CONSULT NOTE - Follow Up Consult  Pharmacy Consult for Heparin Indication: pulmonary embolus/DVT  No Known Allergies  Patient Measurements: Height: 6\' 1"  (185.4 cm) Weight: 150 lb 2.1 oz (68.1 kg) IBW/kg (Calculated) : 79.9 Heparin Dosing Weight: 68.1 kg  Vital Signs: Temp: 98.1 F (36.7 C) (08/20 0834) Temp Source: Axillary (08/20 0834) BP: 99/54 (08/20 0843) Pulse Rate: 99 (08/20 0834)  Labs: Recent Labs    02/01/19 1852 02/02/19 0828 02/03/19 0242  HGB 10.5* 10.2* 11.0*  HCT 33.7* 32.2* 34.8*  PLT 185 203 210  HEPARINUNFRC 0.29* 0.34 0.31  CREATININE 1.09 0.67 0.74    Estimated Creatinine Clearance: 75.7 mL/min (by C-G formula based on SCr of 0.74 mg/dL).   Medications:  Medications Prior to Admission  Medication Sig Dispense Refill Last Dose  . aspirin (ASPIRIN 81) 81 MG EC tablet Take 1 tablet (81 mg total) by mouth daily. 90 tablet 3 01/29/2019 at Unknown time  . atorvastatin (LIPITOR) 20 MG tablet Take 1 tablet (20 mg total) by mouth every evening. 90 tablet 1 01/29/2019 at Unknown time  . Omega-3 Fatty Acids (FISH OIL PO) Take 1 capsule by mouth daily.   01/29/2019 at Unknown time  . HYDROcodone-acetaminophen (NORCO/VICODIN) 5-325 MG tablet Take 1 tablet by mouth every 4 (four) hours as needed for moderate pain. (Patient not taking: Reported on 01/30/2019) 12 tablet 0 Not Taking at Unknown time  . nicotine (NICODERM CQ - DOSED IN MG/24 HR) 7 mg/24hr patch Place 1 patch (7 mg total) onto the skin daily. (Patient not taking: Reported on 12/31/2018) 28 patch 0   . prochlorperazine (COMPAZINE) 10 MG tablet Take 1 tablet (10 mg total) by mouth every 6 (six) hours as needed for nausea or vomiting. 30 tablet 0     Assessment: 27 yom presenting with acute bilateral PE, moderate clot burden with RHS. Pharmacy consulted to dose heparin. Not on anticoagulation PTA. No active bleed issues documented - did report coffee-ground emesis and concern for BRBPR on 8/16  AM. Initially given Protonix bolus but not continued as EDP has low suspicion for GIB.  Venous duplex bilateral LE done 01/31/19 :  Positive for RLE DVT  Heparin level therapeutic at 0.31. There were some short pauses for the heparin drip documented in the [facility] 8/18, but night shift RN was not informed of any issues with the infusion. RN noted some blood in the urine 8/18 AM and would call back if this worsens.   Of note, patient has been refusing blood draws. Pt removed own Foley 8/17, scant bleeding at site, hep was paused, then resumed after reinsertion  Hgb 11.0, plt 210. No acute bleeding reported  Goal of Therapy:  Heparin level 0.3-0.7 units/ml Monitor platelets by anticoagulation protocol: Yes   Plan:  Continue heparin drip at 1050 units/hr Daily heparin level, CBC Monitor s/sx of bleeding  Gwenlyn Fudge - Student PharmD 02/03/2019       8:56 AM

## 2019-02-03 NOTE — Progress Notes (Signed)
Pt's BP's have been 89/50 and 99/54. Paged MD. Awaiting orders.

## 2019-02-03 NOTE — Progress Notes (Addendum)
PROGRESS NOTE    Richard [name]  VZD:[id number] DOB: [birth year] DOA: 01/30/2019 PCP: [name], NP   Brief Narrative: 78 y.o. male with stage III NSCLC started on carboplatin/paclitaxel on January 24, 2019, hypertension, hyperlipidemia, and dementia, poor historian due to dementia, brought to the ED by daughter due to worsening dyspnea -Imaging noted dense consolidation, obstructing mass of the right lung base, acute pulmonary embolism with CT evidence of right heart strain. Large right upper lobe mass with collapse right middle lobe, larger than prior exam -Patient is started on broad-spectrum antibiotics, anticoagulation and admitted [facility] course also complicated by A. fib with RVR and hypotension  Subjective: -Blood pressure low last night, required multiple fluid boluses -Agitated throughout the night, required restraints  Assessment & Plan:   Sepsis/post obstructive pneumonia  -Initially treated with IV vancomycin and cefepime -Blood cultures negative, vancomycin discontinued, remains on IV cefepime day 3 -Blood pressure remains soft in the 90-100 range but not very symptomatic -Add low-dose midodrine -Transition to oral antibiotics in 1 to 2 days -Ambulate, out of bed, physical therapy  Acute pulmonary embolism/right heart strain -Provoked in the setting of lung cancer, age, debility -Transition from IV heparin to Xarelto today, Lovenox would have been the better option due to malignancy however given dementia ease of administration will use Xarelto instead -The echocardiogram notes normal EF, on elevated right RV systolic pressure, RV function is normal  Acute respiratory failure  -Due to large lung mass, acute PE and postobstructive pneumonia  New onset AFib with RVR -long pause on cardizem gtt, now with sinus tach -cards following, monitor on tele -anticoagulation as above  AKI: -Improving with hydration   Hypokalemia -Replace  Acute urine retention:   -Started on Flomax, required Foley catheter placement 8/17  Hypercalcemia -Due to dehydration, resolved  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 Followed by radiation oncology initial consult on 01/26/19 for XRT.  Being planned for return next week for CT simulation for concomitant chemotherapy/radiation therapy. -Anticipate he will be a poor candidate for ongoing therapy with multitude of above problems and dementia  Hypernatremia :  -Improving, continue D5 water for 8 more hours today  Non-gap metabolic acidosis with AKI continue hydration.f/u labs. Hypertension: Blood pressure remains stable Hyperlipidemia: Continue Lipitor  Body mass index is 19.81 kg/m.   DVT prophylaxis:Heparin Code Status: DNR Family Communication: Called and updated daughter 8/19 Disposition Plan: Likely home in 48 hours if stable, prognosis appears guarded given his baseline dementia now with multiple complex co-morbidities.  Consultants:  Palliative care.  Procedures:  2D echocardiogram: 1. The left ventricle has normal systolic function with an ejection fraction of 60-65%. The cavity size was normal. Left ventricular diastolic Doppler parameters are indeterminate. No evidence of left ventricular regional wall motion abnormalities.  2. The right ventricle has normal systolic function. The cavity was normal. There is no increase in right ventricular wall thickness. Right ventricular systolic pressure is severely elevated (66mmHg)  3. Tricuspid valve regurgitation is moderate-severe.  4. The aorta is normal unless otherwise noted.  5. Tachycardia, sinus, 118bpm.   Microbiology:  Urine culture/Blood Culture pending.  Antimicrobials: Anti-infectives (From admission, onward)   Start     Dose/Rate Route Frequency Ordered Stop   02/03/19 1400  ceFEPIme (MAXIPIME) 2 g in sodium chloride 0.9 % 100 mL  IVPB     2 g 200 mL/hr over 30 Minutes Intravenous Every 8 hours 02/03/19  0919     01/31/19 1800  vancomycin (VANCOCIN) IVPB 1000 mg/200 mL premix  Status:  Discontinued     1,000 mg 200 mL/hr over 60 Minutes Intravenous Every 24 hours 01/30/19 1611 02/02/19 1510   01/31/19 0600  ceFEPIme (MAXIPIME) 2 g in sodium chloride 0.9 % 100 mL IVPB  Status:  Discontinued     2 g 200 mL/hr over 30 Minutes Intravenous Every 12 hours 01/30/19 1611 02/03/19 0919   01/30/19 1600  vancomycin (VANCOCIN) 1,500 mg in sodium chloride 0.9 % 500 mL IVPB     1,500 mg 250 mL/hr over 120 Minutes Intravenous  Once 01/30/19 1538 01/30/19 2038   01/30/19 1600  ceFEPIme (MAXIPIME) 2 g in sodium chloride 0.9 % 100 mL IVPB     2 g 200 mL/hr over 30 Minutes Intravenous  Once 01/30/19 1538 01/30/19 1635     Objective: Vitals:   02/03/19 0854 02/03/19 1037 02/03/19 1108 02/03/19 1214  BP: (!) 86/52 111/64 116/68 97/60  Pulse:  (!) 101  70  Resp: (!) 21 (!) 26 (!) 28 20  Temp:    97.7 F (36.5 C)  TempSrc:    Axillary  SpO2:  98%  100%  Weight:      Height:        Intake/Output Summary (Last 24 hours) at 02/03/2019 1418 Last data filed at 02/03/2019 0124 Gross per 24 hour  Intake 509.88 ml  Output 1350 ml  Net -840.12 ml   Filed Weights   01/30/19 1210 01/31/19 0900  Weight: 69.1 kg 68.1 kg   Weight change:   Body mass index is 19.81 kg/m.  Intake/Output from previous day: 08/19 0701 - 08/20 0700 In: 1297.8 [P.O.:180; I.V.:961; IV Piggyback:156.8] Out: 1850 [Urine:1850] Intake/Output this shift: No intake/output data recorded.  Examination:  Gen: Early male, chronically ill-appearing, confused agitated, not following commands HEENT: PERRLA, Neck supple, no JVD Lungs: Few rhonchi in right middle lobe and right lower lobe CVS: S1-S2/regular rate  abd: soft, Non tender, non distended, BS present Extremities: No edema Skin: no new rashes Psych, pleasantly confused, poor insight and judgment   Medications:  Scheduled Meds: . atorvastatin  20 mg Oral QPM  . docusate sodium  200 mg Oral QHS  . midodrine  5 mg Oral BID WC  . rivaroxaban  15 mg Oral BID   Followed by  . [START ON 02/24/2019] rivaroxaban  20 mg Oral Q supper  . tamsulosin  0.4 mg Oral QHS   Continuous Infusions: . sodium chloride Stopped (02/02/19 1003)  . ceFEPime (MAXIPIME) IV    . dextrose 50 mL/hr at 02/03/19 1045    Data Reviewed: I have personally reviewed following labs and imaging studies  CBC: Recent Labs  Lab 01/30/19 1542  01/30/19 2131 01/31/19 0426 02/01/19 1852 02/02/19 0828 02/03/19 0242  WBC 18.3*  --  15.5* 17.6* 14.2* 15.4* 14.6*  NEUTROABS 14.0*  --   --   --   --   --   --   HGB 13.0   < > 11.7* 11.6* 10.5* 10.2* 11.0*  HCT 41.6   < > 35.9* 36.6* 33.7* 32.2* 34.8*  MCV 65.4*  --  62.7* 63.9* 65.2* 64.0* 63.5*  PLT 212  --  186 205 185 203 210   < > = values in this interval not displayed.   Basic Metabolic Panel: Recent Labs  Lab 01/30/19 1542 01/30/19 1553 01/31/19 0426 02/01/19 1852 02/02/19 0828 02/03/19 0242  NA 147* 147* 155* 154*  151* 146*  K 4.5 4.2 4.4 2.9* 2.9* 3.9  CL 110  --  123* 124* 120* 115*  CO2 19*  --  21* 19* 24 22  GLUCOSE 114*  --  99 136* 99 100*  BUN 50*  --  45* 20 11 9   CREATININE 1.40* 1.30* 1.59* 1.09 0.67 0.74  CALCIUM 11.5*  --  10.4* 9.3 8.9 8.9   GFR: Estimated Creatinine Clearance: 75.7 mL/min (by C-G formula based on SCr of 0.74 mg/dL). Liver Function Tests: Recent Labs  Lab 01/30/19 1542 01/31/19 0426  AST 21 22  ALT 19 19  ALKPHOS 81 72  BILITOT 0.9 0.7  PROT 7.0 6.4*  ALBUMIN 2.5* 2.4*   No results for input(s): LIPASE, AMYLASE in the last 168 hours. No results for input(s): AMMONIA in the last 168 hours. Coagulation Profile: Recent Labs  Lab 01/30/19 1542  INR 1.2   Cardiac Enzymes: No results for input(s): CKTOTAL, CKMB, CKMBINDEX, TROPONINI in the last 168 hours. BNP (last 3 results) No results for input(s):  PROBNP in the last 8760 hours. HbA1C: No results for input(s): HGBA1C in the last 72 hours. CBG: Recent Labs  Lab 02/02/19 0021  GLUCAP 105*   Lipid Profile: No results for input(s): CHOL, HDL, LDLCALC, TRIG, CHOLHDL, LDLDIRECT in the last 72 hours. Thyroid Function Tests: No results for input(s): TSH, T4TOTAL, FREET4, T3FREE, THYROIDAB in the last 72 hours. Anemia Panel: No results for input(s): VITAMINB12, FOLATE, FERRITIN, TIBC, IRON, RETICCTPCT in the last 72 hours. Sepsis Labs: Recent Labs  Lab 01/30/19 1542 01/30/19 2131 02/01/19 2209 02/02/19 0828  LATICACIDVEN 5.0* 2.9* 3.0* 1.5    Recent Results (from the past 240 hour(s))  SARS Coronavirus 2 [facility] order, Performed in [place] hospital lab) Nasopharyngeal Nasopharyngeal Swab     Status: None   Collection Time: 01/30/19  1:17 PM   Specimen: Nasopharyngeal Swab  Result Value Ref Range Status   SARS Coronavirus 2 NEGATIVE NEGATIVE Final    Comment: (NOTE) If result is NEGATIVE SARS-CoV-2 target nucleic acids are NOT DETECTED. The SARS-CoV-2 RNA is generally detectable in upper and lower  respiratory specimens during the acute phase of infection. The lowest  concentration of SARS-CoV-2 viral copies this assay can detect is 250  copies / mL. A negative result does not preclude SARS-CoV-2 infection  and should not be used as the sole basis for treatment or other  patient management decisions.  A negative result may occur with  improper specimen collection / handling, submission of specimen other  than nasopharyngeal swab, presence of viral mutation(s) within the  areas targeted by this assay, and inadequate number of viral copies  (<250 copies / mL). A negative result must be combined with clinical  observations, patient history, and epidemiological information. If result is POSITIVE SARS-CoV-2 target nucleic acids are DETECTED. The SARS-CoV-2 RNA is generally detectable in upper and lower  respiratory  specimens dur ing the acute phase of infection.  Positive  results are indicative of active infection with SARS-CoV-2.  Clinical  correlation with patient history and other diagnostic information is  necessary to determine patient infection status.  Positive results do  not rule out bacterial infection or co-infection with other viruses. If result is PRESUMPTIVE POSTIVE SARS-CoV-2 nucleic acids MAY BE PRESENT.   A presumptive positive result was obtained on the submitted specimen  and confirmed on repeat testing.  While 2019 novel coronavirus  (SARS-CoV-2) nucleic acids may be present in the submitted sample  additional confirmatory  testing may be necessary for epidemiological  and / or clinical management purposes  to differentiate between  SARS-CoV-2 and other Sarbecovirus currently known to infect humans.  If clinically indicated additional testing with an alternate test  methodology [phone]) is advised. The SARS-CoV-2 RNA is generally  detectable in upper and lower respiratory sp ecimens during the acute  phase of infection. The expected result is Negative. Fact Sheet for Patients:  StrictlyIdeas.no Fact Sheet for Healthcare Providers: BankingDealers.co.za This test is not yet approved or cleared by the [place] FDA and has been authorized for detection and/or diagnosis of SARS-CoV-2 by FDA under an Emergency Use Authorization (EUA).  This EUA will remain in effect (meaning this test can be used) for the duration of the COVID-19 declaration under Section 564(b)(1) of the Act, 21 U.S.C. section 360bbb-3(b)(1), unless the authorization is terminated or revoked sooner. Performed at [facility], [place] [address], [place], [place] [address]   Urine culture     Status: Abnormal   Collection Time: 01/30/19  1:25 PM   Specimen: Urine, Random  Result Value Ref Range Status   Specimen Description URINE, RANDOM  Final   Special  Requests   Final    NONE Performed at [facility], [place] [address], [place], [place] [address]    Culture (A)  Final    20,000 COLONIES/mL MULTIPLE SPECIES PRESENT, SUGGEST RECOLLECTION   Report Status 01/31/2019 FINAL  Final  Blood culture (routine x 2)     Status: None (Preliminary result)   Collection Time: 01/30/19  3:59 PM   Specimen: BLOOD  Result Value Ref Range Status   Specimen Description BLOOD RIGHT ANTECUBITAL  Final   Special Requests   Final    BOTTLES DRAWN AEROBIC AND ANAEROBIC Blood Culture results may not be optimal due to an inadequate volume of blood received in culture bottles   Culture   Final    NO GROWTH 4 DAYS Performed at [facility], [place] [address] [place], [place] [address]    Report Status PENDING  Incomplete  Blood culture (routine x 2)     Status: None (Preliminary result)   Collection Time: 01/30/19  4:04 PM   Specimen: BLOOD  Result Value Ref Range Status   Specimen Description BLOOD LEFT ANTECUBITAL  Final   Special Requests   Final    BOTTLES DRAWN AEROBIC AND ANAEROBIC Blood Culture results may not be optimal due to an inadequate volume of blood received in culture bottles   Culture   Final    NO GROWTH 4 DAYS Performed at [facility], [place] [address], [place], [place] [address]    Report Status PENDING  Incomplete  MRSA PCR Screening     Status: None   Collection Time: 01/31/19  6:18 PM   Specimen: Nasal Mucosa; Nasopharyngeal  Result Value Ref Range Status   MRSA by PCR NEGATIVE NEGATIVE Final    Comment:        The GeneXpert MRSA Assay (FDA approved for NASAL specimens only), is one component of a comprehensive MRSA colonization surveillance program. It is not intended to diagnose MRSA infection nor to guide or monitor treatment for MRSA infections. Performed at [facility], [place] [address], [place], [place] [address]       Radiology Studies: No results found.    LOS: 4 days   Time spent: 10min  More than 50% of that time was spent in counseling and/or coordination of  care.  Domenic Polite, MD [facility]  02/03/2019, 2:18 PM

## 2019-02-03 NOTE — Discharge Instructions (Signed)

## 2019-02-04 LAB — CBC
HCT: 30.7 % — ABNORMAL LOW (ref 39.0–52.0)
Hemoglobin: 10 g/dL — ABNORMAL LOW (ref 13.0–17.0)
MCH: 20.3 pg — ABNORMAL LOW (ref 26.0–34.0)
MCHC: 32.6 g/dL (ref 30.0–36.0)
MCV: 62.3 fL — ABNORMAL LOW (ref 80.0–100.0)
Platelets: 222 10*3/uL (ref 150–400)
RBC: 4.93 MIL/uL (ref 4.22–5.81)
RDW: 17.3 % — ABNORMAL HIGH (ref 11.5–15.5)
WBC: 12.8 10*3/uL — ABNORMAL HIGH (ref 4.0–10.5)
nRBC: 2.1 % — ABNORMAL HIGH (ref 0.0–0.2)

## 2019-02-04 LAB — BASIC METABOLIC PANEL
Anion gap: 8 (ref 5–15)
BUN: 11 mg/dL (ref 8–23)
CO2: 20 mmol/L — ABNORMAL LOW (ref 22–32)
Calcium: 8.6 mg/dL — ABNORMAL LOW (ref 8.9–10.3)
Chloride: 112 mmol/L — ABNORMAL HIGH (ref 98–111)
Creatinine, Ser: 0.63 mg/dL (ref 0.61–1.24)
GFR calc Af Amer: >60 mL/min (ref >60)
GFR calc non Af Amer: >60 mL/min (ref >60)
Glucose, Bld: 116 mg/dL — ABNORMAL HIGH (ref 70–99)
Potassium: 3.5 mmol/L (ref 3.5–5.1)
Sodium: 140 mmol/L (ref 135–145)

## 2019-02-04 LAB — TSH: TSH: 2.966 u[IU]/mL (ref 0.350–4.500)

## 2019-02-04 LAB — CULTURE, BLOOD (ROUTINE X 2)
Culture: NO GROWTH
Culture: NO GROWTH

## 2019-02-04 LAB — T4, FREE: Free T4: 1.21 ng/dL — ABNORMAL HIGH (ref 0.61–1.12)

## 2019-02-04 MED ORDER — AMOXICILLIN-POT CLAVULANATE 875-125 MG PO TABS
1.0000 | ORAL_TABLET | Freq: Two times a day (BID) | ORAL | Status: DC
  Administered 2019-02-04 – 2019-02-06 (×6): 1 via ORAL
  Filled 2019-02-04 (×7): qty 1

## 2019-02-04 MED ORDER — METOPROLOL TARTRATE 12.5 MG HALF TABLET
12.5000 mg | ORAL_TABLET | Freq: Once | ORAL | Status: AC
  Administered 2019-02-04: 12.5 mg via ORAL
  Filled 2019-02-04: qty 1

## 2019-02-04 NOTE — Progress Notes (Addendum)
PROGRESS NOTE    Richard [name]  PYK:[id number] DOB: 1940/12/01 DOA: 01/30/2019 PCP: [name], NP   Brief Narrative: 78 y.o. male with stage III NSCLC started on carboplatin/paclitaxel on January 24, 2019, hypertension, hyperlipidemia, dementia, poor historian due to dementia, brought to the ED by daughter due to worsening dyspnea -CT noted dense consolidation, obstructing mass of the right lung base, acute pulmonary embolism with CT evidence of right heart strain. Large right upper lobe mass with collapse right middle lobe, larger than prior exam -Patient was started on broad-spectrum antibiotics, anticoagulation and admitted [facility] course also complicated by A. fib with RVR and hypotension, remains pleasantly confused  Subjective: -BP more stable today, tolerating diet, continues to confabulate -Denies any dyspnea  Assessment & Plan:   Sepsis/post obstructive pneumonia  -Initially treated with IV vancomycin and cefepime -Blood cultures negative, vancomycin discontinued, remains on IV cefepime day 4 -Blood pressure more stable today, white count down to 12, all cultures negative -Transition to oral Augmentin today -Continue low-dose midodrine which has started yesterday -Ambulate, out of bed, PT OT -Palliative following, patient is family inclined to continue current treatment/chemo and or XRT as offered by Dr. Earlie Server  Acute pulmonary embolism/right heart strain -Provoked in the setting of lung cancer, age, debility -Transition from IV heparin to Xarelto today, Lovenox would have been the better option due to malignancy however given dementia ease of administration, was started on Xarelto instead  -The echocardiogram notes normal EF, on elevated right RV systolic pressure, RV function is normal -Hemodynamically more stable now  Acute respiratory failure  -Due to large lung mass, acute PE and postobstructive pneumonia  New onset AFib with RVR -long pause on cardizem  gtt, now with sinus tach -cards following, remains stable off AV blockers -anticoagulation as above  Dementia -remains pleasantly confused, oriented to self only  AKI: -resolved with hydration  Hypokalemia -Replace  Acute urine retention:  -Started on Flomax, required Foley catheter placement 8/17 -Attempt to remove catheter  Hypercalcemia -Due to dehydration, resolved  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 Followed by radiation oncology initial consult on 01/26/19 for XRT.  Being planned for return next week for CT simulation for concomitant chemotherapy/radiation therapy. -Anticipate he will be a poor candidate for ongoing therapy with multitude of above problems and dementia -Family wants to continue treatment, follow-up with Dr. Earlie Server later this month  Hypernatremia :  -Resolved, D5 water discontinued  Hyperlipidemia: Continue Lipitor  Body mass index is 19.81 kg/m.   DVT prophylaxis: xarelto Code Status: DNR Family Communication: Called and updated daughter 8/19 Disposition Plan: Home in 1 to 2 days  Consultants:  Palliative care.  Procedures:  2D echocardiogram: 1. The left ventricle has normal systolic function with an ejection fraction of 60-65%. The cavity size was normal. Left ventricular diastolic Doppler parameters are indeterminate. No evidence of left ventricular regional wall motion abnormalities.  2. The right ventricle has normal systolic function. The cavity was normal. There is no increase in right ventricular wall thickness. Right ventricular systolic pressure is severely elevated (17mmHg)  3. Tricuspid valve regurgitation is moderate-severe.  4. The aorta is normal unless otherwise noted.  5. Tachycardia, sinus, 118bpm.   Microbiology:  Urine culture/Blood Culture pending.  Antimicrobials: Anti-infectives (From admission, onward)    Start     Dose/Rate Route Frequency Ordered Stop   02/04/19 1030  amoxicillin-clavulanate (AUGMENTIN) 875-125 MG per tablet 1 tablet  1 tablet Oral Every 12 hours 02/04/19 0945     02/03/19 1400  ceFEPIme (MAXIPIME) 2 g in sodium chloride 0.9 % 100 mL IVPB  Status:  Discontinued     2 g 200 mL/hr over 30 Minutes Intravenous Every 8 hours 02/03/19 0919 02/04/19 0945   01/31/19 1800  vancomycin (VANCOCIN) IVPB 1000 mg/200 mL premix  Status:  Discontinued     1,000 mg 200 mL/hr over 60 Minutes Intravenous Every 24 hours 01/30/19 1611 02/02/19 1510   01/31/19 0600  ceFEPIme (MAXIPIME) 2 g in sodium chloride 0.9 % 100 mL IVPB  Status:  Discontinued     2 g 200 mL/hr over 30 Minutes Intravenous Every 12 hours 01/30/19 1611 02/03/19 0919   01/30/19 1600  vancomycin (VANCOCIN) 1,500 mg in sodium chloride 0.9 % 500 mL IVPB     1,500 mg 250 mL/hr over 120 Minutes Intravenous  Once 01/30/19 1538 01/30/19 2038   01/30/19 1600  ceFEPIme (MAXIPIME) 2 g in sodium chloride 0.9 % 100 mL IVPB     2 g 200 mL/hr over 30 Minutes Intravenous  Once 01/30/19 1538 01/30/19 1635     Objective: Vitals:   02/03/19 2311 02/04/19 0000 02/04/19 0400 02/04/19 0621  BP: 107/79 (!) 101/56 100/71 (!) 102/55  Pulse: 77 65  73  Resp: (!) 24 19 (!) 26 (!) 21  Temp: (!) 97.4 F (36.3 C) (!) 97.4 F (36.3 C) (!) 97.5 F (36.4 C) (!) 97.4 F (36.3 C)  TempSrc: Oral Oral Oral Oral  SpO2: 97% 97% 94% 94%  Weight:      Height:        Intake/Output Summary (Last 24 hours) at 02/04/2019 1447 Last data filed at 02/04/2019 0551 Gross per 24 hour  Intake 458.33 ml  Output 1500 ml  Net -1041.67 ml   Filed Weights   01/30/19 1210 01/31/19 0900  Weight: 69.1 kg 68.1 kg   Weight change:   Body mass index is 19.81 kg/m.  Intake/Output from previous day: 08/20 0701 - 08/21 0700 In: 458.3 [I.V.:358.3; IV Piggyback:100] Out: 1500 [Urine:1500] Intake/Output this shift: No intake/output data recorded.  Examination:   Gen: Elderly chronically ill-appearing male, frail, awake alert, pleasant, confused and confabulating HEENT: PERRLA, Neck supple, no JVD Lungs: Decreased breath sounds at both bases otherwise clear  CVS: 1 S2/regular rate and rhythm now Abd: soft, Non tender, non distended, BS present Extremities: No edema Skin: no new rashes Psych, pleasantly confused, poor insight and judgment  Medications:  Scheduled Meds: . amoxicillin-clavulanate  1 tablet Oral Q12H  . atorvastatin  20 mg Oral QPM  . docusate sodium  200 mg Oral QHS  . midodrine  5 mg Oral BID WC  . rivaroxaban  15 mg Oral BID   Followed by  . [START ON 02/24/2019] rivaroxaban  20 mg Oral Q supper  . tamsulosin  0.4 mg Oral QHS   Continuous Infusions: . sodium chloride Stopped (02/02/19 1003)    Data Reviewed: I have personally reviewed following labs and imaging studies  CBC: Recent Labs  Lab 01/30/19 1542  01/31/19 0426 02/01/19 1852 02/02/19 0828 02/03/19 0242 02/04/19 0302  WBC 18.3*   < > 17.6* 14.2* 15.4* 14.6* 12.8*  NEUTROABS 14.0*  --   --   --   --   --   --   HGB 13.0   < > 11.6* 10.5* 10.2* 11.0* 10.0*  HCT 41.6   < > 36.6* 33.7* 32.2* 34.8* 30.7*  MCV 65.4*   < >  63.9* 65.2* 64.0* 63.5* 62.3*  PLT 212   < > 205 185 203 210 222   < > = values in this interval not displayed.   Basic Metabolic Panel: Recent Labs  Lab 01/31/19 0426 02/01/19 1852 02/02/19 0828 02/03/19 0242 02/04/19 0302  NA 155* 154* 151* 146* 140  K 4.4 2.9* 2.9* 3.9 3.5  CL 123* 124* 120* 115* 112*  CO2 21* 19* 24 22 20*  GLUCOSE 99 136* 99 100* 116*  BUN 45* 20 11 9 11   CREATININE 1.59* 1.09 0.67 0.74 0.63  CALCIUM 10.4* 9.3 8.9 8.9 8.6*   GFR: Estimated Creatinine Clearance: 75.7 mL/min (by C-G formula based on SCr of 0.63 mg/dL). Liver Function Tests: Recent Labs  Lab 01/30/19 1542 01/31/19 0426  AST 21 22  ALT 19 19  ALKPHOS 81 72  BILITOT 0.9 0.7  PROT 7.0 6.4*  ALBUMIN 2.5* 2.4*   No results for  input(s): LIPASE, AMYLASE in the last 168 hours. No results for input(s): AMMONIA in the last 168 hours. Coagulation Profile: Recent Labs  Lab 01/30/19 1542  INR 1.2   Cardiac Enzymes: No results for input(s): CKTOTAL, CKMB, CKMBINDEX, TROPONINI in the last 168 hours. BNP (last 3 results) No results for input(s): PROBNP in the last 8760 hours. HbA1C: No results for input(s): HGBA1C in the last 72 hours. CBG: Recent Labs  Lab 02/02/19 0021  GLUCAP 105*   Lipid Profile: No results for input(s): CHOL, HDL, LDLCALC, TRIG, CHOLHDL, LDLDIRECT in the last 72 hours. Thyroid Function Tests: Recent Labs    02/04/19 0302  TSH 2.966  FREET4 1.21*   Anemia Panel: No results for input(s): VITAMINB12, FOLATE, FERRITIN, TIBC, IRON, RETICCTPCT in the last 72 hours. Sepsis Labs: Recent Labs  Lab 01/30/19 1542 01/30/19 2131 02/01/19 2209 02/02/19 0828  LATICACIDVEN 5.0* 2.9* 3.0* 1.5    Recent Results (from the past 240 hour(s))  SARS Coronavirus 2 [facility] order, Performed in [facility] hospital lab) Nasopharyngeal Nasopharyngeal Swab     Status: None   Collection Time: 01/30/19  1:17 PM   Specimen: Nasopharyngeal Swab  Result Value Ref Range Status   SARS Coronavirus 2 NEGATIVE NEGATIVE Final    Comment: (NOTE) If result is NEGATIVE SARS-CoV-2 target nucleic acids are NOT DETECTED. The SARS-CoV-2 RNA is generally detectable in upper and lower  respiratory specimens during the acute phase of infection. The lowest  concentration of SARS-CoV-2 viral copies this assay can detect is 250  copies / mL. A negative result does not preclude SARS-CoV-2 infection  and should not be used as the sole basis for treatment or other  patient management decisions.  A negative result may occur with  improper specimen collection / handling, submission of specimen other  than nasopharyngeal swab, presence of viral mutation(s) within the  areas targeted by this assay, and inadequate number of  viral copies  (<250 copies / mL). A negative result must be combined with clinical  observations, patient history, and epidemiological information. If result is POSITIVE SARS-CoV-2 target nucleic acids are DETECTED. The SARS-CoV-2 RNA is generally detectable in upper and lower  respiratory specimens dur ing the acute phase of infection.  Positive  results are indicative of active infection with SARS-CoV-2.  Clinical  correlation with patient history and other diagnostic information is  necessary to determine patient infection status.  Positive results do  not rule out bacterial infection or co-infection with other viruses. If result is PRESUMPTIVE POSTIVE SARS-CoV-2 nucleic acids MAY BE PRESENT.  A presumptive positive result was obtained on the submitted specimen  and confirmed on repeat testing.  While 2019 novel coronavirus  (SARS-CoV-2) nucleic acids may be present in the submitted sample  additional confirmatory testing may be necessary for epidemiological  and / or clinical management purposes  to differentiate between  SARS-CoV-2 and other Sarbecovirus currently known to infect humans.  If clinically indicated additional testing with an alternate test  methodology [id number]) is advised. The SARS-CoV-2 RNA is generally  detectable in upper and lower respiratory sp ecimens during the acute  phase of infection. The expected result is Negative. Fact Sheet for Patients:  StrictlyIdeas.no Fact Sheet for Healthcare Providers: BankingDealers.co.za This test is not yet approved or cleared by the [place] FDA and has been authorized for detection and/or diagnosis of SARS-CoV-2 by FDA under an Emergency Use Authorization (EUA).  This EUA will remain in effect (meaning this test can be used) for the duration of the COVID-19 declaration under Section 564(b)(1) of the Act, 21 U.S.C. section 360bbb-3(b)(1), unless the authorization is  terminated or revoked sooner. Performed at [facility], [place] [address], [place], [place] [address]   Urine culture     Status: Abnormal   Collection Time: 01/30/19  1:25 PM   Specimen: Urine, Random  Result Value Ref Range Status   Specimen Description URINE, RANDOM  Final   Special Requests   Final    NONE Performed at [facility], [place] [address], [place], [place] [address]    Culture (A)  Final    20,000 COLONIES/mL MULTIPLE SPECIES PRESENT, SUGGEST RECOLLECTION   Report Status 01/31/2019 FINAL  Final  Blood culture (routine x 2)     Status: None   Collection Time: 01/30/19  3:59 PM   Specimen: BLOOD  Result Value Ref Range Status   Specimen Description BLOOD RIGHT ANTECUBITAL  Final   Special Requests   Final    BOTTLES DRAWN AEROBIC AND ANAEROBIC Blood Culture results may not be optimal due to an inadequate volume of blood received in culture bottles   Culture   Final    NO GROWTH 5 DAYS Performed at [facility], [place] [address], [place], [place] [address]    Report Status 02/04/2019 FINAL  Final  Blood culture (routine x 2)     Status: None   Collection Time: 01/30/19  4:04 PM   Specimen: BLOOD  Result Value Ref Range Status   Specimen Description BLOOD LEFT ANTECUBITAL  Final   Special Requests   Final    BOTTLES DRAWN AEROBIC AND ANAEROBIC Blood Culture results may not be optimal due to an inadequate volume of blood received in culture bottles   Culture   Final    NO GROWTH 5 DAYS Performed at [facility], [place] [address] [place], [place] [address]    Report Status 02/04/2019 FINAL  Final  MRSA PCR Screening     Status: None   Collection Time: 01/31/19  6:18 PM   Specimen: Nasal Mucosa; Nasopharyngeal  Result Value Ref Range Status   MRSA by PCR NEGATIVE NEGATIVE Final    Comment:        The GeneXpert MRSA Assay (FDA approved for NASAL specimens only), is one component of a comprehensive MRSA colonization surveillance program. It is  not intended to diagnose MRSA infection nor to guide or monitor treatment for MRSA infections. Performed at [facility], [place] [address], [place], [place] [address]  Radiology Studies: No results found.    LOS: 5 days   Time spent: 57min More than 50% of that time was spent in counseling and/or coordination of care.  Domenic Polite, MD [facility]  02/04/2019, 2:47 PM

## 2019-02-04 NOTE — Progress Notes (Signed)
Pt had a 4 beat run of V tach. Pt is stable and asymptomatic. MD notified. Will continue to monitor.

## 2019-02-04 NOTE — Progress Notes (Signed)
MEWS Guidelines - (patients age 76 and over)  Red - At High Risk for Deterioration Yellow - At risk for Deterioration  1. Go to room and assess patient 2. Validate data. Is this patient's baseline? If data confirmed: 3. Is this an acute change? 4. Administer prn meds/treatments as ordered. 5. Note Sepsis score 6. Review goals of care 7. Sports coach, RRT nurse and Provider. 8. Ask Provider to come to bedside.  9. Document patient condition/interventions/response. 10. Increase frequency of vital signs and focused assessments to at least q15 minutes x 4, then q30 minutes x2. - If stable, then q1h x3, then q4h x3 and then q8h or dept. routine. - If unstable, contact Provider & RRT nurse. Prepare for possible transfer. 11. Add entry in progress notes using the smart phrase ".MEWS". 1. Go to room and assess patient 2. Validate data. Is this patient's baseline? If data confirmed: 3. Is this an acute change? 4. Administer prn meds/treatments as ordered? 5. Note Sepsis score 6. Review goals of care 7. Sports coach and Provider 8. Call RRT nurse as needed. 9. Document patient condition/interventions/response. 10. Increase frequency of vital signs and focused assessments to at least q2h x2. - If stable, then q4h x2 and then q8h or dept. routine. - If unstable, contact Provider & RRT nurse. Prepare for possible transfer. 11. Add entry in progress notes using the smart phrase ".MEWS".  Green - Likely stable Lavender - Comfort Care Only  1. Continue routine/ordered monitoring.  2. Review goals of care. 1. Continue routine/ordered monitoring. 2. Review goals of care.   Patent is DNR.Mews score has been going back and forth between green and yellow.

## 2019-02-04 NOTE — Evaluation (Signed)
Physical Therapy Evaluation Patient Details Name: Richard [name] MRN: [id number] DOB: 1941-03-14 Today's Date: 02/04/2019   History of Present Illness  Pt is a 78 y/o male admitted secondary to worsening dyspnea. Pt with sepsis secondary to post obstructive PNA from R lung mass. Also found to have PE with R heart strain and a fib with RVR. PMH includes HTN, dementia, and lung cancer.   Clinical Impression  Pt admitted secondary to problem above with deficits below. Pt with increased agitation during session, and was cursing throughout. Pt had stated he would hit PT, however, then reports "I'm just kidding" and would laugh. Was able to complete bed level assessment, however, agitation limited further mobility. Required min A to roll from side to side. Unsure of caregiver support at home and feel pt will require SNF level therapies at d/c. Will continue to follow acutely to maximize functional mobility independence and safety.     Follow Up Recommendations SNF;Supervision/Assistance - 24 hour    Equipment Recommendations  Other (comment)(TBD)    Recommendations for Other Services       Precautions / Restrictions Precautions Precautions: Fall;Other (comment) Precaution Comments: pt easily agitated and sometimes gets aggressive per notes.  Restrictions Weight Bearing Restrictions: No      Mobility  Bed Mobility Overal bed mobility: Needs Assistance Bed Mobility: Rolling Rolling: Min assist         General bed mobility comments: Attempted to get pt to sit EOB, however, pt becoming agitated and began cussing stating he wasn't doing anything. Was able to get pt to roll to L with min A.   Transfers                 General transfer comment: Unable secondary to agitation   Ambulation/Gait                Stairs            Wheelchair Mobility    Modified Rankin (Stroke Patients Only)       Balance                                              Pertinent Vitals/Pain Pain Assessment: Faces Faces Pain Scale: No hurt    Home Living Family/patient expects to be discharged to:: Private residence Living Arrangements: Children Available Help at Discharge: Family;Available 24 hours/day Type of Home: House Home Access: Stairs to enter   [employer] of Steps: 2-3 Home Layout: Two level Home Equipment: None Additional Comments: Information gained from previous admission, as pt agitated when asked questions.     Prior Function Level of Independence: Needs assistance   Gait / Transfers Assistance Needed: Per previous notes, daughter assists with stair navigation and pt is independent with ambulation. Unsure of current PLOF as pt unable to answer secondary to cognitive deficits.            Hand Dominance        Extremity/Trunk Assessment   Upper Extremity Assessment Upper Extremity Assessment: Difficult to assess due to impaired cognition;LUE deficits/detail;RUE deficits/detail RUE Deficits / Details: Grossly 4/5 throughout. Pt required multiple cues to stay on task for MMT.  LUE Deficits / Details: Grossly 4/5 throughout. Required cues to stay on task for MMT.     Lower Extremity Assessment Lower Extremity Assessment: RLE deficits/detail;LLE deficits/detail RLE Deficits / Details: Pt able to perform ankle  pump, heel slide, and SLR. Unable to perform MMT as pt stating "don't mess with my legs." LLE Deficits / Details: Pt able to perform ankle pump, heel slide, and SLR. Unable to perform MMT as pt stating "don't mess with my legs."       Communication   Communication: No difficulties  Cognition Arousal/Alertness: Awake/alert Behavior During Therapy: Agitated Overall Cognitive Status: No family/caregiver present to determine baseline cognitive functioning                                 General Comments: Pt cursing at times throughout session and stating he would hit PT. Pt would then state  "I'm just kidding." Never became physically agressive during session, however, cursed repeatedly throughout in general conversation. Pt not very cooperative throughout session. Pt kept asking for cigarettes and hard liquor.       General Comments General comments (skin integrity, edema, etc.): No family present     Exercises     Assessment/Plan    PT Assessment Patient needs continued PT services  PT Problem List Decreased strength;Decreased activity tolerance;Decreased balance;Decreased mobility;Decreased cognition;Decreased knowledge of use of DME;Decreased safety awareness;Decreased knowledge of precautions       PT Treatment Interventions DME instruction;Gait training;Functional mobility training;Therapeutic activities;Therapeutic exercise;Stair training;Balance training;Cognitive remediation;Patient/family education    PT Goals (Current goals can be found in the Care Plan section)  Acute Rehab PT Goals Patient Stated Goal: "go get me a cigarette"  PT Goal Formulation: With patient Time For Goal Achievement: 02/18/19 Potential to Achieve Goals: Fair    Frequency Min 2X/week   Barriers to discharge Other (comment) Unsure of caregiver support at home     Co-evaluation               AM-PAC PT "6 Clicks" Mobility  Outcome Measure Help needed turning from your back to your side while in a flat bed without using bedrails?: A Little Help needed moving from lying on your back to sitting on the side of a flat bed without using bedrails?: A Lot Help needed moving to and from a bed to a chair (including a wheelchair)?: A Lot Help needed standing up from a chair using your arms (e.g., wheelchair or bedside chair)?: A Lot Help needed to walk in hospital room?: A Lot Help needed climbing 3-5 steps with a railing? : A Lot 6 Click Score: 13    End of Session   Activity Tolerance: Treatment limited secondary to agitation Patient left: in bed;with call bell/phone within  reach;with bed alarm set Nurse Communication: Mobility status PT Visit Diagnosis: Difficulty in walking, not elsewhere classified (R26.2)    Time: 9924-2683 PT Time Calculation (min) (ACUTE ONLY): 16 min   Charges:   PT Evaluation $PT Eval Moderate Complexity: 1 Mod          Leighton Ruff, PT, DPT  Acute Rehabilitation Services  Pager: [phone] Office: [phone]   Rudean Hitt 02/04/2019, 4:46 PM

## 2019-02-04 NOTE — Progress Notes (Signed)
Daily Progress Note   Patient Name: Richard [name]       Date: 02/04/2019 DOB: 07-13-1940  Age: 78 y.o. MRN#: [id number] Attending Physician: Domenic Polite, MD Primary Care Physician: [name], NP Admit Date: 01/30/2019  Reason for Consultation/Follow-up: Establishing goals of care  Subjective: Some PO intake yesterday evening   Length of Stay: 5  Current Medications: Scheduled Meds:  . amoxicillin-clavulanate  1 tablet Oral Q12H  . atorvastatin  20 mg Oral QPM  . docusate sodium  200 mg Oral QHS  . midodrine  5 mg Oral BID WC  . rivaroxaban  15 mg Oral BID   Followed by  . [START ON 02/24/2019] rivaroxaban  20 mg Oral Q supper  . tamsulosin  0.4 mg Oral QHS    Continuous Infusions: . sodium chloride Stopped (02/02/19 1003)    PRN Meds: sodium chloride, acetaminophen **OR** acetaminophen    Vital Signs: BP (!) 102/55   Pulse 73   Temp (!) 97.4 F (36.3 C) (Oral)   Resp (!) 21   Ht 6\' 1"  (1.854 m)   Wt 68.1 kg   SpO2 94%   BMI 19.81 kg/m  SpO2: SpO2: 94 % O2 Device: O2 Device: Nasal Cannula O2 Flow Rate: O2 Flow Rate (L/min): 4 L/min  Intake/output summary:   Intake/Output Summary (Last 24 hours) at 02/04/2019 1210 Last data filed at 02/04/2019 0551 Gross per 24 hour  Intake 458.33 ml  Output 1500 ml  Net -1041.67 ml   LBM: Last BM Date: 02/01/19 Baseline Weight: Weight: 69.1 kg Most recent weight: Weight: 68.1 kg       Palliative Assessment/Data: PPS 20%    Flowsheet Rows     Most Recent Value  Intake Tab  Referral Department  Hospitalist  Unit at Time of Referral  -- [progressive]  Palliative Care Primary Diagnosis  Sepsis/Infectious Disease  Date Notified  01/31/19  Palliative Care Type  New Palliative care  Date of Admission  01/30/19  Date  first seen by Palliative Care  01/31/19  # of days Palliative referral response time  0 Day(s)  # of days IP prior to Palliative referral  1  Clinical Assessment  Palliative Performance Scale Score  40%  Pain Max last 24 hours  Not able to report  Pain Min Last 24 hours  Not able to report  Dyspnea Max Last 24 Hours  Not able to report  Dyspnea Min Last 24 hours  Not able to report  Psychosocial & Spiritual Assessment  Palliative Care Outcomes      Patient Active Problem List   Diagnosis Date Noted  . Atrial fibrillation with RVR ([place])   . Palliative care by specialist   . DNR (do not resuscitate) discussion   . Severe sepsis ([place]) 01/30/2019  . Acute pulmonary embolism ([place]) 01/30/2019  . Acute respiratory failure with hypoxia ([place]) 01/30/2019  . AKI (acute kidney injury) ([place]) 01/30/2019  . Poor appetite 01/05/2019  . Goals of care, counseling/discussion 12/28/2018  . Encounter for antineoplastic chemotherapy 12/28/2018  . Stage III squamous cell carcinoma of right lung ([place]) 12/28/2018  . Dementia without behavioral disturbance ([place]) 12/16/2018  . Lung mass 12/15/2018  . Pelvic mass  in male 12/15/2018  . Postobstructive pneumonia 12/15/2018  . Hypertension     Palliative Care Assessment & Plan   HPI: 78 y.o. male  with past medical history of dementia nearing end stages, non-small cell lung cancer, squamous cell carcinoma large right middle lobe lung mass with postobstructive pneumonia as well as right hilar and mediastinal lymphadenopathy diagnosed in June 2020, HTN/HDL, current smoker, weight loss since June 2020 admitted on 01/30/2019 with severe sepsis from post obstructive pna. Found to have PE. PMT consulted for [place].  Assessment: Followed up with patient's daughter, [name]. She tells me she feels that the patient is "much better". She tells me about her visit with him last night - tells me he was much more interactive, talking on the phone to other family members, and  eating.   We discussed plan of care moving forward - she is still interested in him coming home - does not want him to go to SNF. She tells me "He won't eat unless he's with me". We discussed options for care at home. Detailed differences between home health and hospice care. Initially, she is interested in hospice care but also tells me she would like to continue aggressive care - such as she is hopeful for rehabilitation and hopeful her father can continue to receive cancer treatment. She would also want him to return to the hospital for care if needed. I suggested to her that home health may be more in line with her goals of care. Suggested that palliative care could follow outpatient and she is agreeable to this. She tells me that the patient's brother recently spoke to Dr. Inda Merlin and was told the patient may be able to continue chemo at some point. She asks that I call the patient's brother, Eddie Dibbles.  I called and spoke with Eddie Dibbles - discussed the above conversation and he agrees - again telling me about conversation with Dr. Inda Merlin and hopeful for continued cancer treatment for Mr. Freda Munro. He agrees that home health is appropriate moving forward.  All questions and concerns addressed.   Recommendations/Plan:  Family now stating they are hopeful for continued cancer treatment after discussion with Dr. Inda Merlin (during previous conversations they stated they did not think patient could continue with treatment)  Some interest in hospice care; however, not in line with goals of care  Suggest home health with outpatient palliative to follow  Family does not want patient to go to SNF  Code Status:  DNR  Prognosis:   < 3 months is possible given cancer, frailty, declining functional and nutritional status  Discharge Planning:  Home with Home Health - suggest outpatient palliative follow as well  Care plan was discussed with patient's daughter and brother  Thank you for allowing the  Palliative Medicine Team to assist in the care of this patient.   Total Time 35 minutes Prolonged Time Billed  no    The above conversation was completed via telephone due to the visitor restrictions during the COVID-19 pandemic. Thorough chart review and discussion with necessary members of the care team was completed as part of assessment. All issues were discussed and addressed but no physical exam was performed.    Greater than 50%  of this time was spent counseling and coordinating care related to the above assessment and plan.  Juel Burrow, DNP, [facility] Palliative Medicine Team Team Phone # [phone]  Pager [phone]

## 2019-02-04 NOTE — Progress Notes (Signed)
D/C'd foley. Pt tolerated well. Put a condom cath on him. Will continue to monitor.

## 2019-02-05 ENCOUNTER — Other Ambulatory Visit: Payer: Self-pay

## 2019-02-05 ENCOUNTER — Inpatient Hospital Stay (HOSPITAL_COMMUNITY): Payer: Medicare HMO

## 2019-02-05 ENCOUNTER — Encounter (HOSPITAL_COMMUNITY): Payer: Self-pay | Admitting: Radiology

## 2019-02-05 LAB — BASIC METABOLIC PANEL
Anion gap: 10 (ref 5–15)
BUN: 8 mg/dL (ref 8–23)
CO2: 20 mmol/L — ABNORMAL LOW (ref 22–32)
Calcium: 8.4 mg/dL — ABNORMAL LOW (ref 8.9–10.3)
Chloride: 111 mmol/L (ref 98–111)
Creatinine, Ser: 0.74 mg/dL (ref 0.61–1.24)
GFR calc Af Amer: >60 mL/min (ref >60)
GFR calc non Af Amer: >60 mL/min (ref >60)
Glucose, Bld: 100 mg/dL — ABNORMAL HIGH (ref 70–99)
Potassium: 3.4 mmol/L — ABNORMAL LOW (ref 3.5–5.1)
Sodium: 141 mmol/L (ref 135–145)

## 2019-02-05 LAB — CBC
HCT: 35.6 % — ABNORMAL LOW (ref 39.0–52.0)
Hemoglobin: 11.3 g/dL — ABNORMAL LOW (ref 13.0–17.0)
MCH: 20 pg — ABNORMAL LOW (ref 26.0–34.0)
MCHC: 31.7 g/dL (ref 30.0–36.0)
MCV: 63 fL — ABNORMAL LOW (ref 80.0–100.0)
Platelets: 255 10*3/uL (ref 150–400)
RBC: 5.65 MIL/uL (ref 4.22–5.81)
RDW: 18.4 % — ABNORMAL HIGH (ref 11.5–15.5)
WBC: 13.7 10*3/uL — ABNORMAL HIGH (ref 4.0–10.5)
nRBC: 1.5 % — ABNORMAL HIGH (ref 0.0–0.2)

## 2019-02-05 MED ORDER — AMIODARONE HCL IN DEXTROSE 360-4.14 MG/200ML-% IV SOLN
60.0000 mg/h | INTRAVENOUS | Status: AC
  Administered 2019-02-05: 60 mg/h via INTRAVENOUS
  Filled 2019-02-05: qty 200

## 2019-02-05 MED ORDER — LORAZEPAM 2 MG/ML IJ SOLN
1.0000 mg | Freq: Once | INTRAMUSCULAR | Status: AC
  Administered 2019-02-05: 1 mg via INTRAVENOUS
  Filled 2019-02-05: qty 1

## 2019-02-05 MED ORDER — POLYETHYLENE GLYCOL 3350 17 G PO PACK: 17.0000 g | PACK | Freq: Every day | ORAL | Status: DC | PRN

## 2019-02-05 MED ORDER — SODIUM CHLORIDE 0.9 % IV BOLUS
1000.0000 mL | Freq: Once | INTRAVENOUS | Status: AC
  Administered 2019-02-05: 1000 mL via INTRAVENOUS

## 2019-02-05 MED ORDER — IOHEXOL 300 MG/ML  SOLN
75.0000 mL | Freq: Once | INTRAMUSCULAR | Status: AC | PRN
  Administered 2019-02-05: 75 mL via INTRAVENOUS

## 2019-02-05 MED ORDER — AMIODARONE HCL IN DEXTROSE 360-4.14 MG/200ML-% IV SOLN
30.0000 mg/h | INTRAVENOUS | Status: DC
  Administered 2019-02-05: 30 mg/h via INTRAVENOUS
  Filled 2019-02-05 (×4): qty 200

## 2019-02-05 MED ORDER — POTASSIUM CHLORIDE CRYS ER 20 MEQ PO TBCR
40.0000 meq | EXTENDED_RELEASE_TABLET | Freq: Once | ORAL | Status: AC
  Administered 2019-02-05: 40 meq via ORAL
  Filled 2019-02-05: qty 4

## 2019-02-05 NOTE — Plan of Care (Signed)
  Problem: Education: Goal: Knowledge of General Education information will improve Description: Including pain rating scale, medication(s)/side effects and non-pharmacologic comfort measures Outcome: Not Progressing  Patient confused. Unable to educate.  Problem: Health Behavior/Discharge Planning: Goal: Ability to manage health-related needs will improve Outcome: Not Progressing  Patient confused. Unable to assist with health management.   Problem: Nutrition: Goal: Adequate nutrition will be maintained Outcome: Not Progressing  Poor appetite. PO encouraged.

## 2019-02-05 NOTE — Progress Notes (Addendum)
PROGRESS NOTE    Richard [name]  JOA:[id number] DOB: Jul 15, 1940 DOA: 01/30/2019 PCP: [name], NP   Brief Narrative:  78 year old with stage III non-small cell lung cancer on chemotherapy, essential hypertension, hyperlipidemia, dementia, overall poor historian initially came to the hospital with complains of shortness of breath.  CT showed obstructive mass in the right lung base along with pulmonary embolism with right heart strain.  There was also a large right upper lobe mass with collapse of the right middle lobe.  He was started on broad-spectrum antibiotics and anticoagulation.  Hospital course was complicated by atrial fibrillation with RVR, initially on Cardizem causing hypotension therefore switched to amiodarone.   Assessment & Plan:   Principal Problem:   Severe sepsis ([place]) Active Problems:   Postobstructive pneumonia   Acute pulmonary embolism ([facility])   Acute respiratory failure with hypoxia ([facility])   AKI (acute kidney injury) ([place])   Palliative care by specialist   DNR (do not resuscitate) discussion   Atrial fibrillation with RVR ([place])  Sepsis secondary to postobstructive pneumonia Stage III squamous cell lung cancer - Initially treated with IV vancomycin and cefepime.  Vancomycin discontinued, on cefepime day 4.  Augmentin day 2.  We will complete 7-day course. On Midodrine.  -Out of bed to chair, PT/OT-recommending skilled nursing facility. -Patient would still like to pursue chemo/radiation as offered by Dr. Earlie Server.  Palliative care team following.  Acute pulmonary embolism with cor pulmonale - Initially on IV heparin now on Xarelto. -Echocardiogram shows normal ejection fraction with elevated right heart pressures.  For now hemodynamically stable.  New onset atrial fibrillation with RVR - Initially Cardizem caused long pauses and hypotension therefore discontinued.  Acute urinary retention - Foley catheter initially placed 8/17, removed -Continue  Flomax.  Hyperlipidemia -Statin  Moderate to severe protein calorie malnutrition -Encourage oral intake  Family is not sure patient should go to skilled nursing facility or home with home health services.  DVT prophylaxis: Xarelto Code Status: DNR Family Communication: Spoke with the daughter [name]. Myrle Sheng, but didn't answer so left him a voicemail.  Eddie Dibbles called back, updated him as well.  Disposition Plan: Once heart rate is under better control, will transition him home.  Consultants:   Palliative care  Cardiology   Subjective: Laying in the bed, denies any complaints.  Review of Systems Otherwise negative except as per HPI, including: General = no fevers, chills, dizziness, malaise, fatigue HEENT/EYES = negative for pain, redness, loss of vision, double vision, blurred vision, loss of hearing, sore throat, hoarseness, dysphagia Cardiovascular= negative for chest pain, palpitation, murmurs, lower extremity swelling Respiratory/lungs= negative for shortness of breath, cough, hemoptysis, wheezing, mucus production Gastrointestinal= negative for nausea, vomiting,, abdominal pain, melena, hematemesis Genitourinary= negative for Dysuria, Hematuria, Change in Urinary Frequency MSK = Negative for arthralgia, myalgias, Back Pain, Joint swelling  Neurology= Negative for headache, seizures, numbness, tingling  Psychiatry= Negative for anxiety, depression, suicidal and homocidal ideation Allergy/Immunology= Medication/Food allergy as listed  Skin= Negative for Rash, lesions, ulcers, itching  Objective: Vitals:   02/04/19 1534 02/04/19 2246 02/05/19 0248 02/05/19 0611  BP: 116/63 100/63 (!) 105/40 94/60  Pulse: 91 81  (!) 111  Resp: 17   18  Temp: 98 F (36.7 C) 99 F (37.2 C)    TempSrc: Oral Oral    SpO2: 96% 93%  100%  Weight:      Height:        Intake/Output Summary (Last 24 hours) at 02/05/2019 1105 Last data filed at  02/04/2019 2036 Gross per 24 hour  Intake  240 ml  Output 2050 ml  Net -1810 ml   Filed Weights   01/30/19 1210 01/31/19 0900  Weight: 69.1 kg 68.1 kg    Examination:  General exam: Elderly frail overall weak appearing with temporal wasting Respiratory system: Clear to auscultation. Respiratory effort normal. Cardiovascular system: S1 & S2 heard, RRR. No JVD, murmurs, rubs, gallops or clicks. No pedal edema. Gastrointestinal system: Abdomen is nondistended, soft and nontender. No organomegaly or masses felt. Normal bowel sounds heard. Central nervous system: Alert and oriented. No focal neurological deficits. Extremities: Symmetric 5 x 5 power. Skin: No rashes, lesions or ulcers Psychiatry: Judgement and insight appear normal. Mood & affect appropriate.   External  catheter in place  Data Reviewed:   CBC: Recent Labs  Lab 01/30/19 1542  02/01/19 1852 02/02/19 0828 02/03/19 0242 02/04/19 0302 02/05/19 0325  WBC 18.3*   < > 14.2* 15.4* 14.6* 12.8* 13.7*  NEUTROABS 14.0*  --   --   --   --   --   --   HGB 13.0   < > 10.5* 10.2* 11.0* 10.0* 11.3*  HCT 41.6   < > 33.7* 32.2* 34.8* 30.7* 35.6*  MCV 65.4*   < > 65.2* 64.0* 63.5* 62.3* 63.0*  PLT 212   < > 185 203 210 222 255   < > = values in this interval not displayed.   Basic Metabolic Panel: Recent Labs  Lab 02/01/19 1852 02/02/19 0828 02/03/19 0242 02/04/19 0302 02/05/19 0325  NA 154* 151* 146* 140 141  K 2.9* 2.9* 3.9 3.5 3.4*  CL 124* 120* 115* 112* 111  CO2 19* 24 22 20* 20*  GLUCOSE 136* 99 100* 116* 100*  BUN 20 11 9 11 8   CREATININE 1.09 0.67 0.74 0.63 0.74  CALCIUM 9.3 8.9 8.9 8.6* 8.4*   GFR: Estimated Creatinine Clearance: 75.7 mL/min (by C-G formula based on SCr of 0.74 mg/dL). Liver Function Tests: Recent Labs  Lab 01/30/19 1542 01/31/19 0426  AST 21 22  ALT 19 19  ALKPHOS 81 72  BILITOT 0.9 0.7  PROT 7.0 6.4*  ALBUMIN 2.5* 2.4*   No results for input(s): LIPASE, AMYLASE in the last 168 hours. No results for input(s): AMMONIA  in the last 168 hours. Coagulation Profile: Recent Labs  Lab 01/30/19 1542  INR 1.2   Cardiac Enzymes: No results for input(s): CKTOTAL, CKMB, CKMBINDEX, TROPONINI in the last 168 hours. BNP (last 3 results) No results for input(s): PROBNP in the last 8760 hours. HbA1C: No results for input(s): HGBA1C in the last 72 hours. CBG: Recent Labs  Lab 02/02/19 0021  GLUCAP 105*   Lipid Profile: No results for input(s): CHOL, HDL, LDLCALC, TRIG, CHOLHDL, LDLDIRECT in the last 72 hours. Thyroid Function Tests: Recent Labs    02/04/19 0302  TSH 2.966  FREET4 1.21*   Anemia Panel: No results for input(s): VITAMINB12, FOLATE, FERRITIN, TIBC, IRON, RETICCTPCT in the last 72 hours. Sepsis Labs: Recent Labs  Lab 01/30/19 1542 01/30/19 2131 02/01/19 2209 02/02/19 0828  LATICACIDVEN 5.0* 2.9* 3.0* 1.5    Recent Results (from the past 240 hour(s))  SARS Coronavirus 2 [facility] order, Performed in [facility] hospital lab) Nasopharyngeal Nasopharyngeal Swab     Status: None   Collection Time: 01/30/19  1:17 PM   Specimen: Nasopharyngeal Swab  Result Value Ref Range Status   SARS Coronavirus 2 NEGATIVE NEGATIVE Final    Comment: (NOTE) If result is  NEGATIVE SARS-CoV-2 target nucleic acids are NOT DETECTED. The SARS-CoV-2 RNA is generally detectable in upper and lower  respiratory specimens during the acute phase of infection. The lowest  concentration of SARS-CoV-2 viral copies this assay can detect is 250  copies / mL. A negative result does not preclude SARS-CoV-2 infection  and should not be used as the sole basis for treatment or other  patient management decisions.  A negative result may occur with  improper specimen collection / handling, submission of specimen other  than nasopharyngeal swab, presence of viral mutation(s) within the  areas targeted by this assay, and inadequate number of viral copies  (<250 copies / mL). A negative result must be combined with clinical   observations, patient history, and epidemiological information. If result is POSITIVE SARS-CoV-2 target nucleic acids are DETECTED. The SARS-CoV-2 RNA is generally detectable in upper and lower  respiratory specimens dur ing the acute phase of infection.  Positive  results are indicative of active infection with SARS-CoV-2.  Clinical  correlation with patient history and other diagnostic information is  necessary to determine patient infection status.  Positive results do  not rule out bacterial infection or co-infection with other viruses. If result is PRESUMPTIVE POSTIVE SARS-CoV-2 nucleic acids MAY BE PRESENT.   A presumptive positive result was obtained on the submitted specimen  and confirmed on repeat testing.  While 2019 novel coronavirus  (SARS-CoV-2) nucleic acids may be present in the submitted sample  additional confirmatory testing may be necessary for epidemiological  and / or clinical management purposes  to differentiate between  SARS-CoV-2 and other Sarbecovirus currently known to infect humans.  If clinically indicated additional testing with an alternate test  methodology [phone]) is advised. The SARS-CoV-2 RNA is generally  detectable in upper and lower respiratory sp ecimens during the acute  phase of infection. The expected result is Negative. Fact Sheet for Patients:  StrictlyIdeas.no Fact Sheet for Healthcare Providers: BankingDealers.co.za This test is not yet approved or cleared by the [place] FDA and has been authorized for detection and/or diagnosis of SARS-CoV-2 by FDA under an Emergency Use Authorization (EUA).  This EUA will remain in effect (meaning this test can be used) for the duration of the COVID-19 declaration under Section 564(b)(1) of the Act, 21 U.S.C. section 360bbb-3(b)(1), unless the authorization is terminated or revoked sooner. Performed at [facility], [place] [address]  [place], [place] [address]   Urine culture     Status: Abnormal   Collection Time: 01/30/19  1:25 PM   Specimen: Urine, Random  Result Value Ref Range Status   Specimen Description URINE, RANDOM  Final   Special Requests   Final    NONE Performed at [facility], [place] [address] [place], [place] [address]    Culture (A)  Final    20,000 COLONIES/mL MULTIPLE SPECIES PRESENT, SUGGEST RECOLLECTION   Report Status 01/31/2019 FINAL  Final  Blood culture (routine x 2)     Status: None   Collection Time: 01/30/19  3:59 PM   Specimen: BLOOD  Result Value Ref Range Status   Specimen Description BLOOD RIGHT ANTECUBITAL  Final   Special Requests   Final    BOTTLES DRAWN AEROBIC AND ANAEROBIC Blood Culture results may not be optimal due to an inadequate volume of blood received in culture bottles   Culture   Final    NO GROWTH 5 DAYS Performed at [facility], [place] [address] [place], [place]  [address]    Report Status 02/04/2019 FINAL  Final  Blood culture (routine x 2)     Status: None   Collection Time: 01/30/19  4:04 PM   Specimen: BLOOD  Result Value Ref Range Status   Specimen Description BLOOD LEFT ANTECUBITAL  Final   Special Requests   Final    BOTTLES DRAWN AEROBIC AND ANAEROBIC Blood Culture results may not be optimal due to an inadequate volume of blood received in culture bottles   Culture   Final    NO GROWTH 5 DAYS Performed at [facility], [place] [address] [place], [place] [address]    Report Status 02/04/2019 FINAL  Final  MRSA PCR Screening     Status: None   Collection Time: 01/31/19  6:18 PM   Specimen: Nasal Mucosa; Nasopharyngeal  Result Value Ref Range Status   MRSA by PCR NEGATIVE NEGATIVE Final    Comment:        The GeneXpert MRSA Assay (FDA approved for NASAL specimens only), is one component of a comprehensive MRSA colonization surveillance program. It is not intended to diagnose MRSA infection nor to guide or monitor treatment for MRSA  infections. Performed at [facility], [place] [address], [place],  [address]          Radiology Studies: No results found.      Scheduled Meds: . amoxicillin-clavulanate  1 tablet Oral Q12H  . atorvastatin  20 mg Oral QPM  . docusate sodium  200 mg Oral QHS  . midodrine  5 mg Oral BID WC  . rivaroxaban  15 mg Oral BID   Followed by  . [START ON 02/24/2019] rivaroxaban  20 mg Oral Q supper  . tamsulosin  0.4 mg Oral QHS   Continuous Infusions: . sodium chloride Stopped (02/02/19 1003)  . amiodarone 30 mg/hr (02/05/19 1041)     LOS: 6 days   Time spent= 35 mins    Danyon Mcginness Arsenio Loader, MD [facility]  If 7PM-7AM, please contact night-coverage [email or web address] 02/05/2019, 11:05 AM

## 2019-02-05 NOTE — Progress Notes (Signed)
1040: Care of patient taken over from [facility].

## 2019-02-05 NOTE — Progress Notes (Signed)
Unable to continue scan do to pt motion. More motion seen as scan progressed. Pt was given Ativan prior to starting scan.

## 2019-02-06 LAB — BASIC METABOLIC PANEL
Anion gap: 10 (ref 5–15)
BUN: 13 mg/dL (ref 8–23)
CO2: 22 mmol/L (ref 22–32)
Calcium: 8.8 mg/dL — ABNORMAL LOW (ref 8.9–10.3)
Chloride: 110 mmol/L (ref 98–111)
Creatinine, Ser: 0.65 mg/dL (ref 0.61–1.24)
GFR calc Af Amer: >60 mL/min (ref >60)
GFR calc non Af Amer: >60 mL/min (ref >60)
Glucose, Bld: 114 mg/dL — ABNORMAL HIGH (ref 70–99)
Potassium: 4.1 mmol/L (ref 3.5–5.1)
Sodium: 142 mmol/L (ref 135–145)

## 2019-02-06 LAB — CBC
HCT: 34.3 % — ABNORMAL LOW (ref 39.0–52.0)
Hemoglobin: 11.3 g/dL — ABNORMAL LOW (ref 13.0–17.0)
MCH: 20.3 pg — ABNORMAL LOW (ref 26.0–34.0)
MCHC: 32.9 g/dL (ref 30.0–36.0)
MCV: 61.5 fL — ABNORMAL LOW (ref 80.0–100.0)
Platelets: 320 10*3/uL (ref 150–400)
RBC: 5.58 MIL/uL (ref 4.22–5.81)
RDW: 18.7 % — ABNORMAL HIGH (ref 11.5–15.5)
WBC: 12.7 10*3/uL — ABNORMAL HIGH (ref 4.0–10.5)
nRBC: 0.2 % (ref 0.0–0.2)

## 2019-02-06 LAB — MAGNESIUM: Magnesium: 2.1 mg/dL (ref 1.7–2.4)

## 2019-02-06 LAB — GLUCOSE, CAPILLARY
Glucose-Capillary: 135 mg/dL — ABNORMAL HIGH (ref 70–99)
Glucose-Capillary: 96 mg/dL (ref 70–99)
Glucose-Capillary: 107 mg/dL — ABNORMAL HIGH (ref 70–99)
Glucose-Capillary: 117 mg/dL — ABNORMAL HIGH (ref 70–99)

## 2019-02-06 MED ORDER — POTASSIUM CHLORIDE 10 MEQ/100ML IV SOLN
10.0000 meq | INTRAVENOUS | Status: AC
  Administered 2019-02-06 (×4): 10 meq via INTRAVENOUS
  Filled 2019-02-06 (×4): qty 100

## 2019-02-06 MED ORDER — AMIODARONE HCL 200 MG PO TABS
200.0000 mg | ORAL_TABLET | Freq: Every day | ORAL | Status: DC
  Administered 2019-02-06 – 2019-02-10 (×4): 200 mg via ORAL
  Filled 2019-02-06 (×5): qty 1

## 2019-02-06 NOTE — Progress Notes (Signed)
PROGRESS NOTE    Richard [name]  OYD:[id number] DOB: 23-Sep-1940 DOA: 01/30/2019 PCP: [name], NP   Brief Narrative:  78 year old with stage III non-small cell lung cancer on chemotherapy, essential hypertension, hyperlipidemia, dementia, overall poor historian initially came to the hospital with complains of shortness of breath.  CT showed obstructive mass in the right lung base along with pulmonary embolism with right heart strain.  There was also a large right upper lobe mass with collapse of the right middle lobe.  He was started on broad-spectrum antibiotics and anticoagulation.  Hospital course was complicated by atrial fibrillation with RVR, initially on Cardizem causing hypotension therefore switched to amiodarone.   Assessment & Plan:   Principal Problem:   Severe sepsis ([place]) Active Problems:   Postobstructive pneumonia   Acute pulmonary embolism (HCC)   Acute respiratory failure with hypoxia (HCC)   AKI (acute kidney injury) ([place])   Palliative care by specialist   DNR (do not resuscitate) discussion   Atrial fibrillation with RVR ([place])  Sepsis secondary to postobstructive pneumonia Stage III squamous cell lung cancer - Initially treated with IV vancomycin and cefepime.  Vancomycin discontinued, on cefepime day 4.  Augmentin day 4.  We will complete 7-day course. On Midodrine.  -Out of bed to chair, PT/OT-SNF -Patient would still like to pursue chemo/radiation as offered by Dr. Earlie Server.  Palliative care team following.  Acute pulmonary embolism with cor pulmonale - Xarelto PO -Echocardiogram shows normal ejection fraction with elevated right heart pressures.  For now hemodynamically stable.  New onset atrial fibrillation with RVR - Initially Cardizem caused long pauses and hypotension therefore discontinued. Stop Amio drip, Start Amio 200mg  po daily.   Acute urinary retention - Foley catheter initially placed 8/17, removed -Continue  Flomax.  Hyperlipidemia -Statin  Moderate to severe protein calorie malnutrition -Encourage oral intake  Family wants SNF  DVT prophylaxis: Xarelto Code Status: DNR Family Communication: Spoke with Eddie Dibbles over the phone and left [name] a message requesting a call back Disposition Plan: Placement SNF.  Consultants:   Palliative care  Cardiology   Subjective: Does not have any complaints.  No other acute events overnight.  Review of Systems Otherwise negative except as per HPI, including: General = no fevers, chills, dizziness, malaise, fatigue HEENT/EYES = negative for pain, redness, loss of vision, double vision, blurred vision, loss of hearing, sore throat, hoarseness, dysphagia Cardiovascular= negative for chest pain, palpitation, murmurs, lower extremity swelling Respiratory/lungs= negative for shortness of breath, cough, hemoptysis, wheezing, mucus production Gastrointestinal= negative for nausea, vomiting,, abdominal pain, melena, hematemesis Genitourinary= negative for Dysuria, Hematuria, Change in Urinary Frequency MSK = Negative for arthralgia, myalgias, Back Pain, Joint swelling  Neurology= Negative for headache, seizures, numbness, tingling  Psychiatry= Negative for anxiety, depression, suicidal and homocidal ideation Allergy/Immunology= Medication/Food allergy as listed  Skin= Negative for Rash, lesions, ulcers, itching   Objective: Vitals:   02/06/19 0015 02/06/19 0200 02/06/19 0456 02/06/19 0803  BP: 103/60 100/64  108/78  Pulse: 76   83  Resp: (!) 36 20  (!) 23  Temp: 98.4 F (36.9 C)  99.2 F (37.3 C) 97.7 F (36.5 C)  TempSrc: Skin  Axillary Axillary  SpO2: 96%   93%  Weight:      Height:        Intake/Output Summary (Last 24 hours) at 02/06/2019 1140 Last data filed at 02/05/2019 2300 Gross per 24 hour  Intake 377 ml  Output --  Net 377 ml   [employer]  01/30/19 1210 01/31/19 0900  Weight: 69.1 kg 68.1 kg     Examination:  Constitutional: Appears chronically ill and very frail Eyes: PERRL, lids and conjunctivae normal ENMT: Mucous membranes are dry posterior pharynx clear of any exudate or lesions.poor dentition Neck: normal, supple, no masses, no thyromegaly Respiratory: Diminished breath sounds at the bases but poor respiratory effort Cardiovascular: Regular rate and rhythm, no murmurs / rubs / gallops. No extremity edema. 2+ pedal pulses. No carotid bruits.  Abdomen: no tenderness, no masses palpated. No hepatosplenomegaly. Bowel sounds positive.  Musculoskeletal: no clubbing / cyanosis. No joint deformity upper and lower extremities. Good ROM, no contractures. Normal muscle tone.  Skin: no rashes, lesions, ulcers. No induration Neurologic: CN 2-12 grossly intact. Sensation intact, DTR normal. Strength 4/5 in all 4.  Psychiatric: Does not speak much, mostly flat affect.  External  catheter in place  Data Reviewed:   CBC: Recent Labs  Lab 01/30/19 1542  02/01/19 1852 02/02/19 0828 02/03/19 0242 02/04/19 0302 02/05/19 0325  WBC 18.3*   < > 14.2* 15.4* 14.6* 12.8* 13.7*  NEUTROABS 14.0*  --   --   --   --   --   --   HGB 13.0   < > 10.5* 10.2* 11.0* 10.0* 11.3*  HCT 41.6   < > 33.7* 32.2* 34.8* 30.7* 35.6*  MCV 65.4*   < > 65.2* 64.0* 63.5* 62.3* 63.0*  PLT 212   < > 185 203 210 222 255   < > = values in this interval not displayed.   Basic Metabolic Panel: Recent Labs  Lab 02/01/19 1852 02/02/19 0828 02/03/19 0242 02/04/19 0302 02/05/19 0325  NA 154* 151* 146* 140 141  K 2.9* 2.9* 3.9 3.5 3.4*  CL 124* 120* 115* 112* 111  CO2 19* 24 22 20* 20*  GLUCOSE 136* 99 100* 116* 100*  BUN 20 11 9 11 8   CREATININE 1.09 0.67 0.74 0.63 0.74  CALCIUM 9.3 8.9 8.9 8.6* 8.4*   GFR: Estimated Creatinine Clearance: 75.7 mL/min (by C-G formula based on SCr of 0.74 mg/dL). Liver Function Tests: Recent Labs  Lab 01/30/19 1542 01/31/19 0426  AST 21 22  ALT 19 19  ALKPHOS 81  72  BILITOT 0.9 0.7  PROT 7.0 6.4*  ALBUMIN 2.5* 2.4*   No results for input(s): LIPASE, AMYLASE in the last 168 hours. No results for input(s): AMMONIA in the last 168 hours. Coagulation Profile: Recent Labs  Lab 01/30/19 1542  INR 1.2   Cardiac Enzymes: No results for input(s): CKTOTAL, CKMB, CKMBINDEX, TROPONINI in the last 168 hours. BNP (last 3 results) No results for input(s): PROBNP in the last 8760 hours. HbA1C: No results for input(s): HGBA1C in the last 72 hours. CBG: Recent Labs  Lab 02/02/19 0021 02/06/19 0759  GLUCAP 105* 135*   Lipid Profile: No results for input(s): CHOL, HDL, LDLCALC, TRIG, CHOLHDL, LDLDIRECT in the last 72 hours. Thyroid Function Tests: Recent Labs    02/04/19 0302  TSH 2.966  FREET4 1.21*   Anemia Panel: No results for input(s): VITAMINB12, FOLATE, FERRITIN, TIBC, IRON, RETICCTPCT in the last 72 hours. Sepsis Labs: Recent Labs  Lab 01/30/19 1542 01/30/19 2131 02/01/19 2209 02/02/19 0828  LATICACIDVEN 5.0* 2.9* 3.0* 1.5    Recent Results (from the past 240 hour(s))  SARS Coronavirus 2 [facility] order, Performed in [facility] hospital lab) Nasopharyngeal Nasopharyngeal Swab     Status: None   Collection Time: 01/30/19  1:17 PM   Specimen: Nasopharyngeal Swab  Result Value Ref Range Status   SARS Coronavirus 2 NEGATIVE NEGATIVE Final    Comment: (NOTE) If result is NEGATIVE SARS-CoV-2 target nucleic acids are NOT DETECTED. The SARS-CoV-2 RNA is generally detectable in upper and lower  respiratory specimens during the acute phase of infection. The lowest  concentration of SARS-CoV-2 viral copies this assay can detect is 250  copies / mL. A negative result does not preclude SARS-CoV-2 infection  and should not be used as the sole basis for treatment or other  patient management decisions.  A negative result may occur with  improper specimen collection / handling, submission of specimen other  than nasopharyngeal swab,  presence of viral mutation(s) within the  areas targeted by this assay, and inadequate number of viral copies  (<250 copies / mL). A negative result must be combined with clinical  observations, patient history, and epidemiological information. If result is POSITIVE SARS-CoV-2 target nucleic acids are DETECTED. The SARS-CoV-2 RNA is generally detectable in upper and lower  respiratory specimens dur ing the acute phase of infection.  Positive  results are indicative of active infection with SARS-CoV-2.  Clinical  correlation with patient history and other diagnostic information is  necessary to determine patient infection status.  Positive results do  not rule out bacterial infection or co-infection with other viruses. If result is PRESUMPTIVE POSTIVE SARS-CoV-2 nucleic acids MAY BE PRESENT.   A presumptive positive result was obtained on the submitted specimen  and confirmed on repeat testing.  While 2019 novel coronavirus  (SARS-CoV-2) nucleic acids may be present in the submitted sample  additional confirmatory testing may be necessary for epidemiological  and / or clinical management purposes  to differentiate between  SARS-CoV-2 and other Sarbecovirus currently known to infect humans.  If clinically indicated additional testing with an alternate test  methodology [phone]) is advised. The SARS-CoV-2 RNA is generally  detectable in upper and lower respiratory sp ecimens during the acute  phase of infection. The expected result is Negative. Fact Sheet for Patients:  StrictlyIdeas.no Fact Sheet for Healthcare Providers: BankingDealers.co.za This test is not yet approved or cleared by the [place] FDA and has been authorized for detection and/or diagnosis of SARS-CoV-2 by FDA under an Emergency Use Authorization (EUA).  This EUA will remain in effect (meaning this test can be used) for the duration of the COVID-19 declaration under  Section 564(b)(1) of the Act, 21 U.S.C. section 360bbb-3(b)(1), unless the authorization is terminated or revoked sooner. Performed at [facility], [place] [address] [place], [place] [address]   Urine culture     Status: Abnormal   Collection Time: 01/30/19  1:25 PM   Specimen: Urine, Random  Result Value Ref Range Status   Specimen Description URINE, RANDOM  Final   Special Requests   Final    NONE Performed at [facility], [place] [address] [place], [place] [address]    Culture (A)  Final    20,000 COLONIES/mL MULTIPLE SPECIES PRESENT, SUGGEST RECOLLECTION   Report Status 01/31/2019 FINAL  Final  Blood culture (routine x 2)     Status: None   Collection Time: 01/30/19  3:59 PM   Specimen: BLOOD  Result Value Ref Range Status   Specimen Description BLOOD RIGHT ANTECUBITAL  Final   Special Requests   Final    BOTTLES DRAWN AEROBIC AND ANAEROBIC Blood Culture results may not be optimal due to an inadequate volume of blood received in culture bottles   Culture  Final    NO GROWTH 5 DAYS Performed at [facility], [place] [address], [place], [place] [address]    Report Status 02/04/2019 FINAL  Final  Blood culture (routine x 2)     Status: None   Collection Time: 01/30/19  4:04 PM   Specimen: BLOOD  Result Value Ref Range Status   Specimen Description BLOOD LEFT ANTECUBITAL  Final   Special Requests   Final    BOTTLES DRAWN AEROBIC AND ANAEROBIC Blood Culture results may not be optimal due to an inadequate volume of blood received in culture bottles   Culture   Final    NO GROWTH 5 DAYS Performed at [facility], [place] [address], [place], [place] [address]    Report Status 02/04/2019 FINAL  Final  MRSA PCR Screening     Status: None   Collection Time: 01/31/19  6:18 PM   Specimen: Nasal Mucosa; Nasopharyngeal  Result Value Ref Range Status   MRSA by PCR NEGATIVE NEGATIVE Final    Comment:        The GeneXpert MRSA Assay (FDA approved for NASAL  specimens only), is one component of a comprehensive MRSA colonization surveillance program. It is not intended to diagnose MRSA infection nor to guide or monitor treatment for MRSA infections. Performed at [facility], [place] [address], [place], [place] [address]          Radiology Studies: Ct Chest W Contrast  Result Date: 02/05/2019 CLINICAL DATA:  Lung cancer, assess treatment response EXAM: CT CHEST WITH CONTRAST TECHNIQUE: Multidetector CT imaging of the chest was performed during intravenous contrast administration. CONTRAST:  4mL OMNIPAQUE IOHEXOL 300 MG/ML  SOLN COMPARISON:  CT chest, 01/30/2019, PET-CT, 01/04/2019, CT chest, 12/14/2018 FINDINGS: Cardiovascular: Aortic atherosclerosis. Normal heart size. No pericardial effusion. There is bilateral pulmonary embolus as seen on prior examination dated 01/30/2019, generally in the segmental to subsegmental vessels. Although this examination is not tailored to evaluate the pulmonary arteries, the burden of embolus appears to be somewhat decreased when compared to prior examination, particularly in the left lower lobe pulmonary artery (series 3, image 90). Mediastinum/Nodes: No significant change in prominent pretracheal lymph nodes measuring up to 1.0 cm in short axis (series 3, image 75). Thyroid gland, trachea, and esophagus demonstrate no significant findings. Lungs/Pleura: There is a spiculated perihilar mass of the right lung with obstruction of the right middle lobe bronchi and total atelectasis/postobstructive consolidation of the right middle lobe. The dominant mass measures at least 6.7 x 5.5 x 5.6 cm (series 3, image 93, series 6, image 58), previously 5.4 x 5.6 x 3.8 cm when measured similarly on initial CT examination dated 12/14/2018. Moderate centrilobular emphysema. Moderate right pleural effusion, substantially enlarged when compared to prior examination. There is irregular subpleural airspace disease of the dependent  left lower lobe distal to pulmonary embolus (series 3, image 113). No change in a 5 mm nonspecific pulmonary nodule of the left lower lobe (series 4, image 114). Upper Abdomen: No acute abnormality. Musculoskeletal: No chest wall mass or suspicious bone lesions identified. IMPRESSION: 1. There is a spiculated perihilar mass of the right lung with obstruction of the right middle lobe bronchi and total atelectasis/postobstructive consolidation of the right middle lobe. The dominant mass measures at least 6.7 x 5.5 x 5.6 cm (series 3, image 93, series 6, image 58), previously 5.4 x 5.6 x 3.8 cm when measured similarly on initial CT examination dated 12/14/2018. 2. No significant change in prominent  pretracheal lymph nodes measuring up to 1.0 cm in short axis (series 3, image 75). 3. Unchanged 5 mm nonspecific pulmonary nodule of the left lower lobe (series 4, image 114). Attention on follow-up. 4. There is bilateral pulmonary embolus as seen on prior examination dated 01/30/2019, generally in the segmental to subsegmental vessels. Although this examination is not tailored to evaluate the pulmonary arteries, the burden of embolus appears to be somewhat decreased when compared to prior examination, particularly in the left lower lobe pulmonary artery (series 3, image 90). 5. There is irregular subpleural airspace disease of the dependent left lower lobe distal to pulmonary embolus (series 3, image 113), concerning for pulmonary infarction. 6. Moderate right pleural effusion, substantially enlarged when compared to prior examination. 7.  Emphysema. 8.  Aortic atherosclerosis and coronary artery disease. Electronically Signed   By: Eddie Candle M.D.   On: 02/05/2019 17:22        Scheduled Meds:  amiodarone  200 mg Oral Daily   amoxicillin-clavulanate  1 tablet Oral Q12H   atorvastatin  20 mg Oral QPM   docusate sodium  200 mg Oral QHS   midodrine  5 mg Oral BID WC   rivaroxaban  15 mg Oral BID    Followed by   Derrill Memo ON 02/24/2019] rivaroxaban  20 mg Oral Q supper   tamsulosin  0.4 mg Oral QHS   Continuous Infusions:  sodium chloride Stopped (02/02/19 1003)   potassium chloride 10 mEq (02/06/19 1137)     LOS: 7 days   Time spent= 35 mins    Nadeen Shipman Arsenio Loader, MD [facility]  If 7PM-7AM, please contact night-coverage [email or web address] 02/06/2019, 11:40 AM

## 2019-02-06 NOTE — Progress Notes (Signed)
ANTICOAGULATION CONSULT NOTE - Initial Consult  Pharmacy Consult for Xarelto Indication: DVT, PE  No Known Allergies  Patient Measurements: Height: 6\' 1"  (185.4 cm) Weight: 150 lb 2.1 oz (68.1 kg) IBW/kg (Calculated) : 79.9  Vital Signs: Temp: 97.7 F (36.5 C) (08/23 0803) Temp Source: Axillary (08/23 0803) BP: 108/78 (08/23 0803) Pulse Rate: 83 (08/23 0803)  Labs: Recent Labs    02/04/19 0302 02/05/19 0325  HGB 10.0* 11.3*  HCT 30.7* 35.6*  PLT 222 255  CREATININE 0.63 0.74    Estimated Creatinine Clearance: 75.7 mL/min (by C-G formula based on SCr of 0.74 mg/dL).   Medical History: Past Medical History:  Diagnosis Date  . Dementia ([place])   . High cholesterol   . Hypertension   . Lung mass 12/2018    Assessment: Pt presented with acute bilateral PE; moderate clot burden with RHS and a RLE DVT. In addition, pt has new onset afib; new start amiodarone. No AC PTA.  On 8/20 pt was transitioned from IV heparin to Xarelto.  Hbg 11.3/Hct 35.6 (stable), Plt 255 (wnl)   Plan:  Continue Xarelto 15mg  BID x 21d, then 20mg  daily Monitor for s/sx of bleeding   Acey Lav, PharmD  PGY1 [place] Resident [phone] 02/06/2019,11:28 AM

## 2019-02-07 ENCOUNTER — Inpatient Hospital Stay: Payer: Medicare HMO

## 2019-02-07 LAB — CBC
HCT: 34.3 % — ABNORMAL LOW (ref 39.0–52.0)
Hemoglobin: 11 g/dL — ABNORMAL LOW (ref 13.0–17.0)
MCH: 20.1 pg — ABNORMAL LOW (ref 26.0–34.0)
MCHC: 32.1 g/dL (ref 30.0–36.0)
MCV: 62.6 fL — ABNORMAL LOW (ref 80.0–100.0)
Platelets: 412 10*3/uL — ABNORMAL HIGH (ref 150–400)
RBC: 5.48 MIL/uL (ref 4.22–5.81)
RDW: 18.8 % — ABNORMAL HIGH (ref 11.5–15.5)
WBC: 12.4 10*3/uL — ABNORMAL HIGH (ref 4.0–10.5)
nRBC: 0.4 % — ABNORMAL HIGH (ref 0.0–0.2)

## 2019-02-07 LAB — BASIC METABOLIC PANEL
Anion gap: 11 (ref 5–15)
BUN: 13 mg/dL (ref 8–23)
CO2: 20 mmol/L — ABNORMAL LOW (ref 22–32)
Calcium: 9.5 mg/dL (ref 8.9–10.3)
Chloride: 116 mmol/L — ABNORMAL HIGH (ref 98–111)
Creatinine, Ser: 0.9 mg/dL (ref 0.61–1.24)
GFR calc Af Amer: >60 mL/min (ref >60)
GFR calc non Af Amer: >60 mL/min (ref >60)
Glucose, Bld: 101 mg/dL — ABNORMAL HIGH (ref 70–99)
Potassium: 3.9 mmol/L (ref 3.5–5.1)
Sodium: 147 mmol/L — ABNORMAL HIGH (ref 135–145)

## 2019-02-07 LAB — MAGNESIUM: Magnesium: 2.3 mg/dL (ref 1.7–2.4)

## 2019-02-07 NOTE — Care Management Important Message (Signed)
Important Message  Patient Details  Name: Richard [name] MRN: [id number] Date of Birth: 23-Jun-1940   Medicare Important Message Given:  Yes     Richard [name] 02/07/2019, 8:34 AM

## 2019-02-07 NOTE — TOC Initial Note (Signed)
Transition of Care [facility]) - Initial/Assessment Note    Patient Details  Name: Richard [name] MRN: [id number] Date of Birth: Apr 19, 1941  [facility] CM/SW Contact:    Benard Halsted, LCSW Phone Number: 02/07/2019, 2:58 PM  Clinical Narrative:                 CSW received consult for possible SNF placement at time of discharge. CSW spoke with patient's daughter, Richard [name], regarding PT recommendation of SNF placement at time of discharge. Patient's daughter reported that family is currently unable to care for patient at their home given patient's current physical needs and fall risk. Patient's daughter expressed understanding of PT recommendation and is agreeable to SNF placement at time of discharge. Patient's daughter reports preference for somewhere near the hospital because she does not dirve. She is aware that SNFs are not allowing visitors due to [place]. CSW discussed insurance authorization process and provided Medicare SNF ratings list. Patient's daughter expressed being hopeful for rehab and to feel better soon. No further questions reported at this time. CSW to continue to follow and assist with discharge planning needs.   Expected Discharge Plan: Skilled Nursing Facility Barriers to Discharge: Insurance Authorization, Continued Medical Work up   Patient Goals and CMS Choice Patient states their goals for this hospitalization and ongoing recovery are:: Rehab CMS Medicare.gov Compare Post Acute Care list provided to:: Patient Represenative (must comment)(Daughter) Choice offered to / list presented to : Adult Children  Expected Discharge Plan and Services Expected Discharge Plan: [place] In-house Referral: Clinical Social Work Discharge Planning Services: NA Post Acute Care Choice: [place] Living arrangements for the past 2 months: Apartment                 DME Arranged: N/A DME Agency: NA       HH Arranged: NA [place] Agency: NA         Prior Living Arrangements/Services Living arrangements for the past 2 months: Apartment Lives with:: Self Patient language and need for interpreter reviewed:: Yes Do you feel safe going back to the place where you live?: Yes      Need for Family Participation in Patient Care: Yes (Comment) Care giver support system in place?: Yes (comment)   Criminal Activity/Legal Involvement Pertinent to Current Situation/Hospitalization: No - Comment as needed  Activities of Daily Living      Permission Sought/Granted Permission sought to share information with : Facility Sport and exercise psychologist, Family Supports Permission granted to share information with : Yes, Verbal Permission Granted  Share Information with NAME: Richard [name]  Permission granted to share info w AGENCY: SNFs  Permission granted to share info w Relationship: Daughter  Permission granted to share info w Contact Information: [phone]  Emotional Assessment Appearance:: Appears stated age Attitude/Demeanor/Rapport: Unable to Assess Affect (typically observed): Unable to Assess Orientation: : Oriented to Self Alcohol / Substance Use: Not Applicable Psych Involvement: No (comment)  Admission diagnosis:  Lactic acidosis [E87.2] Tachycardia [R00.0] Coffee ground emesis [K92.0] AKI (acute kidney injury) ([place]) [N17.9] HCAP (healthcare-associated pneumonia) [J18.9] Hypotension, unspecified hypotension type [I95.9] Acute pulmonary embolism, unspecified pulmonary embolism type, unspecified whether acute cor pulmonale present [facility]) [I26.99] Patient Active Problem List   Diagnosis Date Noted  . Atrial fibrillation with RVR ([place])   . Palliative care by specialist   . DNR (do not resuscitate) discussion   . Severe sepsis ([place]) 01/30/2019  . Acute pulmonary embolism ([place]) 01/30/2019  . Acute respiratory failure with hypoxia ([place]) 01/30/2019  .  AKI (acute kidney injury) ([place]) 01/30/2019  . Poor appetite 01/05/2019  . Goals of care,  counseling/discussion 12/28/2018  . Encounter for antineoplastic chemotherapy 12/28/2018  . Stage III squamous cell carcinoma of right lung ([place]) 12/28/2018  . Dementia without behavioral disturbance ([place]) 12/16/2018  . Lung mass 12/15/2018  . Pelvic mass in male 12/15/2018  . Postobstructive pneumonia 12/15/2018  . Hypertension    PCP:  [name], NP Pharmacy:   [place], [place] - [address] [place] [address] Phone: [phone] Fax: [phone]  [place]), [place] - [address] [place] [address] Phone: [phone] Fax: [phone]     Social Determinants of Health (SDOH) Interventions    Readmission Risk Interventions No flowsheet data found.

## 2019-02-07 NOTE — Progress Notes (Signed)
PROGRESS NOTE    Richard [name]  JOI:[id number] DOB: 1940/12/29 DOA: 01/30/2019 PCP: [name], NP   Brief Narrative:  78 year old with stage III non-small cell lung cancer on chemotherapy, essential hypertension, hyperlipidemia, dementia, overall poor historian initially came to the hospital with complains of shortness of breath.  CT showed obstructive mass in the right lung base along with pulmonary embolism with right heart strain.  There was also a large right upper lobe mass with collapse of the right middle lobe.  He was started on broad-spectrum antibiotics and anticoagulation.  Hospital course was complicated by atrial fibrillation with RVR, initially on Cardizem causing hypotension therefore switched to PO amiodarone.   Assessment & Plan:   Principal Problem:   Severe sepsis ([place]) Active Problems:   Postobstructive pneumonia   Acute pulmonary embolism (HCC)   Acute respiratory failure with hypoxia (HCC)   AKI (acute kidney injury) ([place])   Palliative care by specialist   DNR (do not resuscitate) discussion   Atrial fibrillation with RVR ([place])  Sepsis secondary to postobstructive pneumonia Stage III squamous cell lung cancer - Initially treated with IV vancomycin and cefepime.  Vancomycin discontinued, Completed Cefepime followed by Augmentin Course. Monitor him clinically now.  -Out of bed to chair, PT/OT-SNF -Patient would still like to pursue chemo/radiation as offered by Dr. Earlie Server.  Palliative care team following.  Acute pulmonary embolism with cor pulmonale - Continue Xarelto -Echocardiogram shows normal ejection fraction with elevated right heart pressures.  For now hemodynamically stable.  New onset atrial fibrillation with RVR - Initially Cardizem caused long pauses and hypotension therefore discontinued.  Amiodarone drip stopped. -Ambulatory milligrams daily.  Acute urinary retention - Foley catheter initially placed 8/17, removed -Continue Flomax.   Hyperlipidemia -Statin  Moderate to severe protein calorie malnutrition -Encourage oral intake  Family would like to undergo SNF. Overall patient's prognosis is guarded  DVT prophylaxis: Xarelto Code Status: DNR Family Communication: None at bedside, will attempt to call them later today. Disposition Plan: SNF placement  Consultants:   Palliative care  Cardiology   Subjective: No acute events overnight.  Laying in the bed mumbling some words but at times he is incoherent  Review of Systems Otherwise negative except as per HPI, including: General = no fevers, chills, dizziness, malaise, fatigue HEENT/EYES = negative for pain, redness, loss of vision, double vision, blurred vision, loss of hearing, sore throat, hoarseness, dysphagia Cardiovascular= negative for chest pain, palpitation, murmurs, lower extremity swelling Respiratory/lungs= negative for shortness of breath, cough, hemoptysis, wheezing, mucus production Gastrointestinal= negative for nausea, vomiting,, abdominal pain, melena, hematemesis Genitourinary= negative for Dysuria, Hematuria, Change in Urinary Frequency MSK = Negative for arthralgia, myalgias, Back Pain, Joint swelling  Neurology= Negative for headache, seizures, numbness, tingling  Psychiatry= Negative for anxiety, depression, suicidal and homocidal ideation Allergy/Immunology= Medication/Food allergy as listed  Skin= Negative for Rash, lesions, ulcers, itching    Objective: Vitals:   02/06/19 0803 02/06/19 2006 02/06/19 2030 02/06/19 2346  BP: 108/78 107/60  130/73  Pulse: 83 86  68  Resp: (!) 23 (!) 26 20 20   Temp: 97.7 F (36.5 C) 97.9 F (36.6 C)  98 F (36.7 C)  TempSrc: Axillary Axillary  Axillary  SpO2: 93% 94%  96%  Weight:      Height:       No intake or output data in the 24 hours ending 02/07/19 1020 Filed Weights   01/30/19 1210 01/31/19 0900  Weight: 69.1 kg 68.1 kg    Examination: Constitutional: Chronically  ill and  frail bilateral temporal wasting Eyes: PERRL, lids and conjunctivae normal ENMT: Mucous membranes are moist. Posterior pharynx clear of any exudate or lesions.Normal dentition.  Neck: normal, supple, no masses, no thyromegaly Respiratory: Diminished breath sounds Cardiovascular: Regular rate and rhythm, no murmurs / rubs / gallops. No extremity edema. 2+ pedal pulses. No carotid bruits.  Abdomen: no tenderness, no masses palpated. No hepatosplenomegaly. Bowel sounds positive.  Musculoskeletal: no clubbing / cyanosis. No joint deformity upper and lower extremities. Good ROM, no contractures. Normal muscle tone.  Skin: no rashes, lesions, ulcers. No induration Neurologic: CN 2-12 grossly intact. Sensation intact, DTR normal. Strength 4/5 in all 4.  Psychiatric: Poor judgment and insight   External  catheter in place  Data Reviewed:   CBC: Recent Labs  Lab 02/02/19 0828 02/03/19 0242 02/04/19 0302 02/05/19 0325 02/06/19 1152  WBC 15.4* 14.6* 12.8* 13.7* 12.7*  HGB 10.2* 11.0* 10.0* 11.3* 11.3*  HCT 32.2* 34.8* 30.7* 35.6* 34.3*  MCV 64.0* 63.5* 62.3* 63.0* 61.5*  PLT 203 210 222 255 557   Basic Metabolic Panel: Recent Labs  Lab 02/02/19 0828 02/03/19 0242 02/04/19 0302 02/05/19 0325 02/06/19 1152  NA 151* 146* 140 141 142  K 2.9* 3.9 3.5 3.4* 4.1  CL 120* 115* 112* 111 110  CO2 24 22 20* 20* 22  GLUCOSE 99 100* 116* 100* 114*  BUN 11 9 11 8 13   CREATININE 0.67 0.74 0.63 0.74 0.65  CALCIUM 8.9 8.9 8.6* 8.4* 8.8*  MG  --   --   --   --  2.1   GFR: Estimated Creatinine Clearance: 75.7 mL/min (by C-G formula based on SCr of 0.65 mg/dL). Liver Function Tests: No results for input(s): AST, ALT, ALKPHOS, BILITOT, PROT, ALBUMIN in the last 168 hours. No results for input(s): LIPASE, AMYLASE in the last 168 hours. No results for input(s): AMMONIA in the last 168 hours. Coagulation Profile: No results for input(s): INR, PROTIME in the last 168 hours. Cardiac Enzymes: No  results for input(s): CKTOTAL, CKMB, CKMBINDEX, TROPONINI in the last 168 hours. BNP (last 3 results) No results for input(s): PROBNP in the last 8760 hours. HbA1C: No results for input(s): HGBA1C in the last 72 hours. CBG: Recent Labs  Lab 02/02/19 0021 02/06/19 0759 02/06/19 1221 02/06/19 1729 02/06/19 2207  GLUCAP 105* 135* 96 107* 117*   Lipid Profile: No results for input(s): CHOL, HDL, LDLCALC, TRIG, CHOLHDL, LDLDIRECT in the last 72 hours. Thyroid Function Tests: No results for input(s): TSH, T4TOTAL, FREET4, T3FREE, THYROIDAB in the last 72 hours. Anemia Panel: No results for input(s): VITAMINB12, FOLATE, FERRITIN, TIBC, IRON, RETICCTPCT in the last 72 hours. Sepsis Labs: Recent Labs  Lab 02/01/19 2209 02/02/19 0828  LATICACIDVEN 3.0* 1.5    Recent Results (from the past 240 hour(s))  SARS Coronavirus 2 [facility] order, Performed in [facility] hospital lab) Nasopharyngeal Nasopharyngeal Swab     Status: None   Collection Time: 01/30/19  1:17 PM   Specimen: Nasopharyngeal Swab  Result Value Ref Range Status   SARS Coronavirus 2 NEGATIVE NEGATIVE Final    Comment: (NOTE) If result is NEGATIVE SARS-CoV-2 target nucleic acids are NOT DETECTED. The SARS-CoV-2 RNA is generally detectable in upper and lower  respiratory specimens during the acute phase of infection. The lowest  concentration of SARS-CoV-2 viral copies this assay can detect is 250  copies / mL. A negative result does not preclude SARS-CoV-2 infection  and should not be used as the sole basis for  treatment or other  patient management decisions.  A negative result may occur with  improper specimen collection / handling, submission of specimen other  than nasopharyngeal swab, presence of viral mutation(s) within the  areas targeted by this assay, and inadequate number of viral copies  (<250 copies / mL). A negative result must be combined with clinical  observations, patient history, and epidemiological  information. If result is POSITIVE SARS-CoV-2 target nucleic acids are DETECTED. The SARS-CoV-2 RNA is generally detectable in upper and lower  respiratory specimens dur ing the acute phase of infection.  Positive  results are indicative of active infection with SARS-CoV-2.  Clinical  correlation with patient history and other diagnostic information is  necessary to determine patient infection status.  Positive results do  not rule out bacterial infection or co-infection with other viruses. If result is PRESUMPTIVE POSTIVE SARS-CoV-2 nucleic acids MAY BE PRESENT.   A presumptive positive result was obtained on the submitted specimen  and confirmed on repeat testing.  While 2019 novel coronavirus  (SARS-CoV-2) nucleic acids may be present in the submitted sample  additional confirmatory testing may be necessary for epidemiological  and / or clinical management purposes  to differentiate between  SARS-CoV-2 and other Sarbecovirus currently known to infect humans.  If clinically indicated additional testing with an alternate test  methodology [phone]) is advised. The SARS-CoV-2 RNA is generally  detectable in upper and lower respiratory sp ecimens during the acute  phase of infection. The expected result is Negative. Fact Sheet for Patients:  StrictlyIdeas.no Fact Sheet for Healthcare Providers: BankingDealers.co.za This test is not yet approved or cleared by the [place] FDA and has been authorized for detection and/or diagnosis of SARS-CoV-2 by FDA under an Emergency Use Authorization (EUA).  This EUA will remain in effect (meaning this test can be used) for the duration of the COVID-19 declaration under Section 564(b)(1) of the Act, 21 U.S.C. section 360bbb-3(b)(1), unless the authorization is terminated or revoked sooner. Performed at [facility], [place] [address], [place], [place] [address]   Urine culture     Status:  Abnormal   Collection Time: 01/30/19  1:25 PM   Specimen: Urine, Random  Result Value Ref Range Status   Specimen Description URINE, RANDOM  Final   Special Requests   Final    NONE Performed at [facility], [place] [address], [place], [place] [address]    Culture (A)  Final    20,000 COLONIES/mL MULTIPLE SPECIES PRESENT, SUGGEST RECOLLECTION   Report Status 01/31/2019 FINAL  Final  Blood culture (routine x 2)     Status: None   Collection Time: 01/30/19  3:59 PM   Specimen: BLOOD  Result Value Ref Range Status   Specimen Description BLOOD RIGHT ANTECUBITAL  Final   Special Requests   Final    BOTTLES DRAWN AEROBIC AND ANAEROBIC Blood Culture results may not be optimal due to an inadequate volume of blood received in culture bottles   Culture   Final    NO GROWTH 5 DAYS Performed at [facility], [place] [address] [place], [place] [address]    Report Status 02/04/2019 FINAL  Final  Blood culture (routine x 2)     Status: None   Collection Time: 01/30/19  4:04 PM   Specimen: BLOOD  Result Value Ref Range Status   Specimen Description BLOOD LEFT ANTECUBITAL  Final   Special Requests   Final    BOTTLES DRAWN AEROBIC AND ANAEROBIC  Blood Culture results may not be optimal due to an inadequate volume of blood received in culture bottles   Culture   Final    NO GROWTH 5 DAYS Performed at [place] [address] [place], [place] [address]    Report Status 02/04/2019 FINAL  Final  MRSA PCR Screening     Status: None   Collection Time: 01/31/19  6:18 PM   Specimen: Nasal Mucosa; Nasopharyngeal  Result Value Ref Range Status   MRSA by PCR NEGATIVE NEGATIVE Final    Comment:        The GeneXpert MRSA Assay (FDA approved for NASAL specimens only), is one component of a comprehensive MRSA colonization surveillance program. It is not intended to diagnose MRSA infection nor to guide or monitor treatment for MRSA infections. Performed at [facility],  [place] [address], [place], [place] [address]          Radiology Studies: Ct Chest W Contrast  Result Date: 02/05/2019 CLINICAL DATA:  Lung cancer, assess treatment response EXAM: CT CHEST WITH CONTRAST TECHNIQUE: Multidetector CT imaging of the chest was performed during intravenous contrast administration. CONTRAST:  34mL OMNIPAQUE IOHEXOL 300 MG/ML  SOLN COMPARISON:  CT chest, 01/30/2019, PET-CT, 01/04/2019, CT chest, 12/14/2018 FINDINGS: Cardiovascular: Aortic atherosclerosis. Normal heart size. No pericardial effusion. There is bilateral pulmonary embolus as seen on prior examination dated 01/30/2019, generally in the segmental to subsegmental vessels. Although this examination is not tailored to evaluate the pulmonary arteries, the burden of embolus appears to be somewhat decreased when compared to prior examination, particularly in the left lower lobe pulmonary artery (series 3, image 90). Mediastinum/Nodes: No significant change in prominent pretracheal lymph nodes measuring up to 1.0 cm in short axis (series 3, image 75). Thyroid gland, trachea, and esophagus demonstrate no significant findings. Lungs/Pleura: There is a spiculated perihilar mass of the right lung with obstruction of the right middle lobe bronchi and total atelectasis/postobstructive consolidation of the right middle lobe. The dominant mass measures at least 6.7 x 5.5 x 5.6 cm (series 3, image 93, series 6, image 58), previously 5.4 x 5.6 x 3.8 cm when measured similarly on initial CT examination dated 12/14/2018. Moderate centrilobular emphysema. Moderate right pleural effusion, substantially enlarged when compared to prior examination. There is irregular subpleural airspace disease of the dependent left lower lobe distal to pulmonary embolus (series 3, image 113). No change in a 5 mm nonspecific pulmonary nodule of the left lower lobe (series 4, image 114). Upper Abdomen: No acute abnormality. Musculoskeletal: No chest wall mass or  suspicious bone lesions identified. IMPRESSION: 1. There is a spiculated perihilar mass of the right lung with obstruction of the right middle lobe bronchi and total atelectasis/postobstructive consolidation of the right middle lobe. The dominant mass measures at least 6.7 x 5.5 x 5.6 cm (series 3, image 93, series 6, image 58), previously 5.4 x 5.6 x 3.8 cm when measured similarly on initial CT examination dated 12/14/2018. 2. No significant change in prominent pretracheal lymph nodes measuring up to 1.0 cm in short axis (series 3, image 75). 3. Unchanged 5 mm nonspecific pulmonary nodule of the left lower lobe (series 4, image 114). Attention on follow-up. 4. There is bilateral pulmonary embolus as seen on prior examination dated 01/30/2019, generally in the segmental to subsegmental vessels. Although this examination is not tailored to evaluate the pulmonary arteries, the burden of embolus appears to be somewhat decreased when compared to prior examination, particularly in the left lower  lobe pulmonary artery (series 3, image 90). 5. There is irregular subpleural airspace disease of the dependent left lower lobe distal to pulmonary embolus (series 3, image 113), concerning for pulmonary infarction. 6. Moderate right pleural effusion, substantially enlarged when compared to prior examination. 7.  Emphysema. 8.  Aortic atherosclerosis and coronary artery disease. Electronically Signed   By: Eddie Candle M.D.   On: 02/05/2019 17:22        Scheduled Meds: . amiodarone  200 mg Oral Daily  . amoxicillin-clavulanate  1 tablet Oral Q12H  . atorvastatin  20 mg Oral QPM  . docusate sodium  200 mg Oral QHS  . midodrine  5 mg Oral BID WC  . rivaroxaban  15 mg Oral BID   Followed by  . [START ON 02/24/2019] rivaroxaban  20 mg Oral Q supper  . tamsulosin  0.4 mg Oral QHS   Continuous Infusions: . sodium chloride Stopped (02/02/19 1003)     LOS: 8 days   Time spent= 25 mins    Sherlin Sonier Arsenio Loader, MD  [facility]  If 7PM-7AM, please contact night-coverage [email or web address] 02/07/2019, 10:20 AM

## 2019-02-07 NOTE — NC FL2 (Signed)
[name]     IDENTIFICATION  Patient Name: Richard [name] Birthdate: Apr 06, 1941 Sex: male Admission Date (Current Location): 01/30/2019  [facility] and [place] Number:  [name] and Address:  [name], [name], [name], [name]      Provider Number: [id number]  Attending Physician Name and Address:  Richard Lack, MD  Relative Name and Phone Number:  Richard [name] daughter, [phone]    Current Level of Care: Hospital Recommended Level of Care: [name] Prior Approval Number:    Date Approved/Denied:   [name]: [id number]  Discharge Plan: SNF    Current Diagnoses: Patient Active Problem List   Diagnosis Date Noted  . Atrial fibrillation with RVR ([place])   . Palliative care by specialist   . DNR (do not resuscitate) discussion   . Severe sepsis ([name]) 01/30/2019  . Acute pulmonary embolism ([name]) 01/30/2019  . Acute respiratory failure with hypoxia ([name]) 01/30/2019  . AKI (acute kidney injury) ([name]) 01/30/2019  . Poor appetite 01/05/2019  . Goals of care, counseling/discussion 12/28/2018  . Encounter for antineoplastic chemotherapy 12/28/2018  . Stage III squamous cell carcinoma of right lung ([name]) 12/28/2018  . Dementia without behavioral disturbance ([name]) 12/16/2018  . Lung mass 12/15/2018  . Pelvic mass in male 12/15/2018  . Postobstructive pneumonia 12/15/2018  . Hypertension     Orientation RESPIRATION BLADDER Height & Weight     Self  Normal Continent Weight: 150 lb 2.1 oz (68.1 kg) Height:  6\' 1"  (185.4 cm)  BEHAVIORAL SYMPTOMS/MOOD NEUROLOGICAL BOWEL NUTRITION STATUS      Continent Diet(Please see DC Summary)  AMBULATORY STATUS COMMUNICATION OF NEEDS Skin   Extensive Assist Verbally Normal                       Personal Care Assistance Level of Assistance  Bathing, Feeding, Dressing Bathing Assistance: Maximum assistance Feeding  assistance: Limited assistance Dressing Assistance: Limited assistance     Functional Limitations Info  Sight, Speech, Hearing Sight Info: Adequate Hearing Info: Adequate Speech Info: Adequate    SPECIAL CARE FACTORS FREQUENCY  PT (By licensed PT), OT (By licensed OT)     PT Frequency: 5x OT Frequency: 3x            Contractures Contractures Info: Not present    Additional Factors Info  Code Status, Allergies Code Status Info: DNR Allergies Info: NKA           Current Medications (02/07/2019):  This is the current hospital active medication list Current Facility-Administered Medications  Medication Dose Route Frequency Provider Last Rate Last Dose  . 0.9 %  sodium chloride infusion   Intravenous PRN Antonieta Pert, MD   Stopped at 02/02/19 1003  . acetaminophen (TYLENOL) tablet 650 mg  650 mg Oral Q6H PRN Shela Leff, MD   650 mg at 02/03/19 2057   Or  . acetaminophen (TYLENOL) suppository 650 mg  650 mg Rectal Q6H PRN Shela Leff, MD      . amiodarone (PACERONE) tablet 200 mg  200 mg Oral Daily Amin, Ankit Chirag, MD   200 mg at 02/06/19 1329  . atorvastatin (LIPITOR) tablet 20 mg  20 mg Oral QPM Shela Leff, MD   20 mg at 02/06/19 1832  . docusate sodium (COLACE) capsule 200 mg  200 mg Oral QHS Gardiner Barefoot, NP   200 mg at 02/06/19 2055  . midodrine (  PROAMATINE) tablet 5 mg  5 mg Oral BID WC Domenic Polite, MD   5 mg at 02/06/19 1832  . polyethylene glycol (MIRALAX / GLYCOLAX) packet 17 g  17 g Oral Daily PRN Amin, Ankit Chirag, MD      . Rivaroxaban (XARELTO) tablet 15 mg  15 mg Oral BID Karren Cobble, RPH   15 mg at 02/06/19 2054   Followed by  . [START ON 02/24/2019] rivaroxaban (XARELTO) tablet 20 mg  20 mg Oral Q supper Karren Cobble, RPH      . tamsulosin (FLOMAX) capsule 0.4 mg  0.4 mg Oral QHS Antonieta Pert, MD   0.4 mg at 02/06/19 2054     Discharge Medications: Please see discharge summary for a list of discharge  medications.  Relevant Imaging Results:  Relevant Lab Results:   Additional Information SSN: [id number]   COVID negative on 8/16. Outpatient palliative follow up.  Benard Halsted, LCSW

## 2019-02-08 LAB — BASIC METABOLIC PANEL
Anion gap: 8 (ref 5–15)
BUN: 13 mg/dL (ref 8–23)
CO2: 23 mmol/L (ref 22–32)
Calcium: 9.3 mg/dL (ref 8.9–10.3)
Chloride: 118 mmol/L — ABNORMAL HIGH (ref 98–111)
Creatinine, Ser: 0.94 mg/dL (ref 0.61–1.24)
GFR calc Af Amer: >60 mL/min (ref >60)
GFR calc non Af Amer: >60 mL/min (ref >60)
Glucose, Bld: 94 mg/dL (ref 70–99)
Potassium: 3.4 mmol/L — ABNORMAL LOW (ref 3.5–5.1)
Sodium: 149 mmol/L — ABNORMAL HIGH (ref 135–145)

## 2019-02-08 LAB — CBC
HCT: 31.6 % — ABNORMAL LOW (ref 39.0–52.0)
Hemoglobin: 10.2 g/dL — ABNORMAL LOW (ref 13.0–17.0)
MCH: 20.1 pg — ABNORMAL LOW (ref 26.0–34.0)
MCHC: 32.3 g/dL (ref 30.0–36.0)
MCV: 62.3 fL — ABNORMAL LOW (ref 80.0–100.0)
Platelets: 401 10*3/uL — ABNORMAL HIGH (ref 150–400)
RBC: 5.07 MIL/uL (ref 4.22–5.81)
RDW: 18.7 % — ABNORMAL HIGH (ref 11.5–15.5)
WBC: 10.3 10*3/uL (ref 4.0–10.5)
nRBC: 0.4 % — ABNORMAL HIGH (ref 0.0–0.2)

## 2019-02-08 LAB — GLUCOSE, CAPILLARY
Glucose-Capillary: 118 mg/dL — ABNORMAL HIGH (ref 70–99)
Glucose-Capillary: 108 mg/dL — ABNORMAL HIGH (ref 70–99)

## 2019-02-08 LAB — MAGNESIUM: Magnesium: 2.2 mg/dL (ref 1.7–2.4)

## 2019-02-08 MED ORDER — DEXTROSE-NACL 5-0.45 % IV SOLN
INTRAVENOUS | Status: DC
  Administered 2019-02-08 (×2): via INTRAVENOUS

## 2019-02-08 MED ORDER — POTASSIUM CHLORIDE 20 MEQ/15ML (10%) PO SOLN
40.0000 meq | Freq: Once | ORAL | Status: AC
  Administered 2019-02-08: 40 meq via ORAL
  Filled 2019-02-08: qty 30

## 2019-02-08 NOTE — Progress Notes (Signed)
Physical Therapy Treatment Patient Details Name: Richard [name] MRN: [id number] DOB: 05-22-1941 Today's Date: 02/08/2019    History of Present Illness Pt is a 78 y/o male admitted secondary to worsening dyspnea. Pt with sepsis secondary to post obstructive PNA from R lung mass. Also found to have PE with R heart strain and a fib with RVR. PMH includes HTN, dementia, and lung cancer.     PT Comments    Pt mentation fluctuated throughout session, at times agreeable to mobility and following commands but more often resistant and focused on trying to get cigarettes. Responded slightly better when daughter gave instructions. Pt stood with min-guard A. Unsteady in standing with decreased awareness of safety deficits. Posterior LOB while holding RW, min A to correct and pt largely unaware. Ambulated 10' in room with RW. Recommend RW for increased stability but also needs someone with him due to poor safety awareness. PT will continue to follow.    Follow Up Recommendations  SNF;Supervision/Assistance - 24 hour     Equipment Recommendations  Rolling walker with 5" wheels    Recommendations for Other Services       Precautions / Restrictions Precautions Precautions: Fall Restrictions Weight Bearing Restrictions: No    Mobility  Bed Mobility Overal bed mobility: Needs Assistance Bed Mobility: Supine to Sit     Supine to sit: Min assist     General bed mobility comments: min HHA to initiate and for elevation of trunk. Pt able to position self EOB  Transfers Overall transfer level: Needs assistance Equipment used: Rolling walker (2 wheeled) Transfers: Sit to/from Stand Sit to Stand: Min guard         General transfer comment: pt wanted everyone to stand away from him so some space given but close guarding for safety as pt began to move which was prudent as pt was unaware as he began to lose balance posterior  Ambulation/Gait Ambulation/Gait assistance: Min assist Gait Distance  (Feet): 10 Feet Assistive device: Rolling walker (2 wheeled) Gait Pattern/deviations: Step-through pattern Gait velocity: decreased Gait velocity interpretation: <1.8 ft/sec, indicate of risk for recurrent falls General Gait Details: impulsive with all movement and unsteady in standing   Stairs             Wheelchair Mobility    Modified Rankin (Stroke Patients Only)       Balance Overall balance assessment: Needs assistance Sitting-balance support: No upper extremity supported Sitting balance-Leahy Scale: Good     Standing balance support: Bilateral upper extremity supported Standing balance-Leahy Scale: Poor Standing balance comment: min A to correct posterior LOB                            Cognition Arousal/Alertness: Awake/alert Behavior During Therapy: Agitated Overall Cognitive Status: History of cognitive impairments - at baseline                                 General Comments: pt minimally cooperative. At times cursing at daughter and PT and then will impulsively follow commands      Exercises General Exercises - Lower Extremity Long Arc Quad: AROM;Both;5 reps;Seated    General Comments General comments (skin integrity, edema, etc.): pt responded slightly better when daughter gave instructions      Pertinent Vitals/Pain Pain Assessment: Faces Faces Pain Scale: No hurt Pain Intervention(s): Monitored during session    Home Living  Prior Function            PT Goals (current goals can now be found in the care plan section) Acute Rehab PT Goals Patient Stated Goal: "go get me a cigarette"  PT Goal Formulation: With patient Time For Goal Achievement: 02/18/19 Potential to Achieve Goals: Fair Progress towards PT goals: Progressing toward goals    Frequency    Min 2X/week      PT Plan Current plan remains appropriate    Co-evaluation              AM-PAC PT "6 Clicks"  Mobility   Outcome Measure  Help needed turning from your back to your side while in a flat bed without using bedrails?: A Little Help needed moving from lying on your back to sitting on the side of a flat bed without using bedrails?: A Little Help needed moving to and from a bed to a chair (including a wheelchair)?: A Little Help needed standing up from a chair using your arms (e.g., wheelchair or bedside chair)?: A Little Help needed to walk in hospital room?: A Little Help needed climbing 3-5 steps with a railing? : A Lot 6 Click Score: 17    End of Session   Activity Tolerance: Treatment limited secondary to agitation Patient left: with call bell/phone within reach;in chair;with chair alarm set;with family/visitor present Nurse Communication: Mobility status PT Visit Diagnosis: Difficulty in walking, not elsewhere classified (R26.2)     Time: 9371-6967 PT Time Calculation (min) (ACUTE ONLY): 25 min  Charges:  $Gait Training: 8-22 mins $Therapeutic Activity: 8-22 mins                     Leighton Roach, PT  Acute Rehab Services  Pager [phone] Office [place] 02/08/2019, 11:15 AM

## 2019-02-08 NOTE — TOC Progression Note (Addendum)
Transition of Care [facility]) - Progression Note    Patient Details  Name: Richard [name] MRN: [id number] Date of Birth: 01-Apr-1941  Transition of Care [facility]) CM/SW [place], LCSW Phone Number: 02/08/2019, 9:34 AM  Clinical Narrative:    9:44am-[facility] able to offer a bed and will start insurance process. Patient will need a new COVID 24-48hrs from discharge. PT on the schedule to see patient today for insurance purposes.   9:34am-CSW updated patient's daughter. She is in agreement with [facility] since [facility] does not have a bed available. CSW waiting on response from facility on if they can accept patient and start insurance.     Expected Discharge Plan: [place] Barriers to Discharge: Ship broker, Continued Medical Work up  Expected Discharge Plan and Services Expected Discharge Plan: [place] In-house Referral: Clinical Social Work Discharge Planning Services: NA Post Acute Care Choice: [place] Living arrangements for the past 2 months: Apartment Expected Discharge Date: 02/08/19               DME Arranged: N/A DME Agency: NA       HH Arranged: NA HH Agency: NA         Social Determinants of Health (SDOH) Interventions    Readmission Risk Interventions No flowsheet data found.

## 2019-02-08 NOTE — Progress Notes (Signed)
PROGRESS NOTE    Richard [name]  YQM:[id number] DOB: 15-May-1941 DOA: 01/30/2019 PCP: [name], NP   Brief Narrative:  78 year old with stage III non-small cell lung cancer on chemotherapy, essential hypertension, hyperlipidemia, dementia, overall poor historian initially came to the hospital with complains of shortness of breath.  CT showed obstructive mass in the right lung base along with pulmonary embolism with right heart strain.  There was also a large right upper lobe mass with collapse of the right middle lobe.  He was started on broad-spectrum antibiotics and anticoagulation.  Hospital course was complicated by atrial fibrillation with RVR, initially on Cardizem causing hypotension therefore switched to PO amiodarone.    Assessment & Plan:   Principal Problem:   Severe sepsis ([place]) Active Problems:   Postobstructive pneumonia   Acute pulmonary embolism (HCC)   Acute respiratory failure with hypoxia (HCC)   AKI (acute kidney injury) ([place])   Palliative care by specialist   DNR (do not resuscitate) discussion   Atrial fibrillation with RVR ([place])  Sepsis secondary to postobstructive pneumonia Stage III squamous cell lung cancer - Initially treated with IV vancomycin and cefepime.  Vancomycin discontinued, Completed Cefepime followed by Augmentin Course. Monitor him clinically now.  -Out of bed to chair, PT/OT-SNF -Patient would still like to pursue chemo/radiation as offered by Dr. Earlie Server.  Seen by palliative care  Acute pulmonary embolism with cor pulmonale - Will be on Xarelto -Echocardiogram shows normal ejection fraction with elevated right heart pressures.  For now hemodynamically stable.  HyperNa and HyperChloride.  Change fluids to D5 1/2 NS 100cc Hr  New onset atrial fibrillation with RVR - Initially Cardizem caused long pauses and hypotension therefore discontinued.  Amiodarone drip stopped. -Now on oral amiodarone  Acute urinary retention - Foley  catheter initially placed 8/17, removed -Continue Flomax.  Hyperlipidemia -Statin  Moderate to severe protein calorie malnutrition -Encourage oral intake  Family would like to undergo SNF. Overall patient's prognosis is guarded  DVT prophylaxis: Xarelto Code Status: DNR Family Communication: None at bedside Disposition Plan: SNF placement  Consultants:   Palliative care  Cardiology   Subjective: No complaints.  Patient is asking me for cigarettes.  Review of Systems Otherwise negative except as per HPI, including: General = no fevers, chills, dizziness, malaise, fatigue HEENT/EYES = negative for pain, redness, loss of vision, double vision, blurred vision, loss of hearing, sore throat, hoarseness, dysphagia Cardiovascular= negative for chest pain, palpitation, murmurs, lower extremity swelling Respiratory/lungs= negative for shortness of breath, cough, hemoptysis, wheezing, mucus production Gastrointestinal= negative for nausea, vomiting,, abdominal pain, melena, hematemesis Genitourinary= negative for Dysuria, Hematuria, Change in Urinary Frequency MSK = Negative for arthralgia, myalgias, Back Pain, Joint swelling  Neurology= Negative for headache, seizures, numbness, tingling  Psychiatry= Negative for anxiety, depression, suicidal and homocidal ideation Allergy/Immunology= Medication/Food allergy as listed  Skin= Negative for Rash, lesions, ulcers, itching    Objective: Vitals:   02/08/19 0153 02/08/19 0533 02/08/19 0833 02/08/19 1255  BP:  110/60 (!) 99/47 (!) 113/51  Pulse:  87 99 72  Resp: 18 16 18 18   Temp:  98.1 F (36.7 C) (!) 97.5 F (36.4 C) 97.9 F (36.6 C)  TempSrc:  Oral Oral Oral  SpO2:  96% 95% 96%  Weight:      Height:        Intake/Output Summary (Last 24 hours) at 02/08/2019 1447 Last data filed at 02/08/2019 1400 Gross per 24 hour  Intake 240 ml  Output 550 ml  Net -310  ml   Filed Weights   01/30/19 1210 01/31/19 0900  Weight: 69.1  kg 68.1 kg    Examination: Constitutional: Cachectic frail bilateral temporal wasting Eyes: PERRL, lids and conjunctivae normal ENMT: Mucous membranes are moist. Posterior pharynx clear of any exudate or lesions.poor dentition Neck: normal, supple, no masses, no thyromegaly Respiratory: clear to auscultation bilaterally, no wheezing, no crackles. Normal respiratory effort. No accessory muscle use.  Cardiovascular: Regular rate and rhythm, no murmurs / rubs / gallops. No extremity edema. 2+ pedal pulses. No carotid bruits.  Abdomen: no tenderness, no masses palpated. No hepatosplenomegaly. Bowel sounds positive.  Musculoskeletal: no clubbing / cyanosis. No joint deformity upper and lower extremities. Good ROM, no contractures. Normal muscle tone.  Skin: no rashes, lesions, ulcers. No induration Neurologic: Grossly moves all the extremities Psychiatric: Poor judgment and insight  External  catheter in place  Data Reviewed:   CBC: Recent Labs  Lab 02/04/19 0302 02/05/19 0325 02/06/19 1152 02/07/19 1100 02/08/19 0337  WBC 12.8* 13.7* 12.7* 12.4* 10.3  HGB 10.0* 11.3* 11.3* 11.0* 10.2*  HCT 30.7* 35.6* 34.3* 34.3* 31.6*  MCV 62.3* 63.0* 61.5* 62.6* 62.3*  PLT 222 255 320 412* 277*   Basic Metabolic Panel: Recent Labs  Lab 02/04/19 0302 02/05/19 0325 02/06/19 1152 02/07/19 1100 02/08/19 0337  NA 140 141 142 147* 149*  K 3.5 3.4* 4.1 3.9 3.4*  CL 112* 111 110 116* 118*  CO2 20* 20* 22 20* 23  GLUCOSE 116* 100* 114* 101* 94  BUN 11 8 13 13 13   CREATININE 0.63 0.74 0.65 0.90 0.94  CALCIUM 8.6* 8.4* 8.8* 9.5 9.3  MG  --   --  2.1 2.3 2.2   GFR: Estimated Creatinine Clearance: 64.4 mL/min (by C-G formula based on SCr of 0.94 mg/dL). Liver Function Tests: No results for input(s): AST, ALT, ALKPHOS, BILITOT, PROT, ALBUMIN in the last 168 hours. No results for input(s): LIPASE, AMYLASE in the last 168 hours. No results for input(s): AMMONIA in the last 168 hours.  Coagulation Profile: No results for input(s): INR, PROTIME in the last 168 hours. Cardiac Enzymes: No results for input(s): CKTOTAL, CKMB, CKMBINDEX, TROPONINI in the last 168 hours. BNP (last 3 results) No results for input(s): PROBNP in the last 8760 hours. HbA1C: No results for input(s): HGBA1C in the last 72 hours. CBG: Recent Labs  Lab 02/02/19 0021 02/06/19 0759 02/06/19 1221 02/06/19 1729 02/06/19 2207  GLUCAP 105* 135* 96 107* 117*   Lipid Profile: No results for input(s): CHOL, HDL, LDLCALC, TRIG, CHOLHDL, LDLDIRECT in the last 72 hours. Thyroid Function Tests: No results for input(s): TSH, T4TOTAL, FREET4, T3FREE, THYROIDAB in the last 72 hours. Anemia Panel: No results for input(s): VITAMINB12, FOLATE, FERRITIN, TIBC, IRON, RETICCTPCT in the last 72 hours. Sepsis Labs: Recent Labs  Lab 02/01/19 2209 02/02/19 0828  LATICACIDVEN 3.0* 1.5    Recent Results (from the past 240 hour(s))  SARS Coronavirus 2 [facility] order, Performed in [facility] hospital lab) Nasopharyngeal Nasopharyngeal Swab     Status: None   Collection Time: 01/30/19  1:17 PM   Specimen: Nasopharyngeal Swab  Result Value Ref Range Status   SARS Coronavirus 2 NEGATIVE NEGATIVE Final    Comment: (NOTE) If result is NEGATIVE SARS-CoV-2 target nucleic acids are NOT DETECTED. The SARS-CoV-2 RNA is generally detectable in upper and lower  respiratory specimens during the acute phase of infection. The lowest  concentration of SARS-CoV-2 viral copies this assay can detect is 250  copies / mL.  A negative result does not preclude SARS-CoV-2 infection  and should not be used as the sole basis for treatment or other  patient management decisions.  A negative result may occur with  improper specimen collection / handling, submission of specimen other  than nasopharyngeal swab, presence of viral mutation(s) within the  areas targeted by this assay, and inadequate number of viral copies  (<250 copies /  mL). A negative result must be combined with clinical  observations, patient history, and epidemiological information. If result is POSITIVE SARS-CoV-2 target nucleic acids are DETECTED. The SARS-CoV-2 RNA is generally detectable in upper and lower  respiratory specimens dur ing the acute phase of infection.  Positive  results are indicative of active infection with SARS-CoV-2.  Clinical  correlation with patient history and other diagnostic information is  necessary to determine patient infection status.  Positive results do  not rule out bacterial infection or co-infection with other viruses. If result is PRESUMPTIVE POSTIVE SARS-CoV-2 nucleic acids MAY BE PRESENT.   A presumptive positive result was obtained on the submitted specimen  and confirmed on repeat testing.  While 2019 novel coronavirus  (SARS-CoV-2) nucleic acids may be present in the submitted sample  additional confirmatory testing may be necessary for epidemiological  and / or clinical management purposes  to differentiate between  SARS-CoV-2 and other Sarbecovirus currently known to infect humans.  If clinically indicated additional testing with an alternate test  methodology [phone]) is advised. The SARS-CoV-2 RNA is generally  detectable in upper and lower respiratory sp ecimens during the acute  phase of infection. The expected result is Negative. Fact Sheet for Patients:  StrictlyIdeas.no Fact Sheet for Healthcare Providers: BankingDealers.co.za This test is not yet approved or cleared by the [place] FDA and has been authorized for detection and/or diagnosis of SARS-CoV-2 by FDA under an Emergency Use Authorization (EUA).  This EUA will remain in effect (meaning this test can be used) for the duration of the COVID-19 declaration under Section 564(b)(1) of the Act, 21 U.S.C. section 360bbb-3(b)(1), unless the authorization is terminated or revoked sooner.  Performed at [facility], [place] [address], [place], [place] [address]   Urine culture     Status: Abnormal   Collection Time: 01/30/19  1:25 PM   Specimen: Urine, Random  Result Value Ref Range Status   Specimen Description URINE, RANDOM  Final   Special Requests   Final    NONE Performed at [facility], [place] [address] [place], [place] [address]    Culture (A)  Final    20,000 COLONIES/mL MULTIPLE SPECIES PRESENT, SUGGEST RECOLLECTION   Report Status 01/31/2019 FINAL  Final  Blood culture (routine x 2)     Status: None   Collection Time: 01/30/19  3:59 PM   Specimen: BLOOD  Result Value Ref Range Status   Specimen Description BLOOD RIGHT ANTECUBITAL  Final   Special Requests   Final    BOTTLES DRAWN AEROBIC AND ANAEROBIC Blood Culture results may not be optimal due to an inadequate volume of blood received in culture bottles   Culture   Final    NO GROWTH 5 DAYS Performed at [facility], [place] [address] [place], [place] [address]    Report Status 02/04/2019 FINAL  Final  Blood culture (routine x 2)     Status: None   Collection Time: 01/30/19  4:04 PM   Specimen: BLOOD  Result Value Ref Range Status   Specimen Description BLOOD  LEFT ANTECUBITAL  Final   Special Requests   Final    BOTTLES DRAWN AEROBIC AND ANAEROBIC Blood Culture results may not be optimal due to an inadequate volume of blood received in culture bottles   Culture   Final    NO GROWTH 5 DAYS Performed at [facility], [place] [address] [place], [place] [address]    Report Status 02/04/2019 FINAL  Final  MRSA PCR Screening     Status: None   Collection Time: 01/31/19  6:18 PM   Specimen: Nasal Mucosa; Nasopharyngeal  Result Value Ref Range Status   MRSA by PCR NEGATIVE NEGATIVE Final    Comment:        The GeneXpert MRSA Assay (FDA approved for NASAL specimens only), is one component of a comprehensive MRSA colonization surveillance program. It is not intended to diagnose MRSA  infection nor to guide or monitor treatment for MRSA infections. Performed at [facility], [place] [address] [place], [place] [address]          Radiology Studies: No results found.      Scheduled Meds: . amiodarone  200 mg Oral Daily  . atorvastatin  20 mg Oral QPM  . docusate sodium  200 mg Oral QHS  . midodrine  5 mg Oral BID WC  . potassium chloride  40 mEq Oral Once  . rivaroxaban  15 mg Oral BID   Followed by  . [START ON 02/24/2019] rivaroxaban  20 mg Oral Q supper  . tamsulosin  0.4 mg Oral QHS   Continuous Infusions: . sodium chloride Stopped (02/02/19 1003)  . dextrose 5 % and 0.45% NaCl 100 mL/hr at 02/08/19 1118     LOS: 9 days   Time spent= 25 mins    Ankit Arsenio Loader, MD [facility]  If 7PM-7AM, please contact night-coverage [email or web address] 02/08/2019, 2:47 PM

## 2019-02-08 NOTE — Progress Notes (Signed)
Chaplain rounding on unit, nurse mentioned that Richard [name] had been made a DNR this morning. Pt is at end of life and family is struggling with making the pt. DNR.   Daughter of pt. [name] pt will be moving to a rehab unit soon. Chaplain offered follow up care upon family request.  - Chaplain Resident, Evelene Croon, M. Div.

## 2019-02-09 DIAGNOSIS — J189 Pneumonia, unspecified organism: Secondary | ICD-10-CM

## 2019-02-09 LAB — BASIC METABOLIC PANEL
Anion gap: 11 (ref 5–15)
BUN: 10 mg/dL (ref 8–23)
CO2: 19 mmol/L — ABNORMAL LOW (ref 22–32)
Calcium: 8.9 mg/dL (ref 8.9–10.3)
Chloride: 114 mmol/L — ABNORMAL HIGH (ref 98–111)
Creatinine, Ser: 0.69 mg/dL (ref 0.61–1.24)
GFR calc Af Amer: >60 mL/min (ref >60)
GFR calc non Af Amer: >60 mL/min (ref >60)
Glucose, Bld: 110 mg/dL — ABNORMAL HIGH (ref 70–99)
Potassium: 4.8 mmol/L (ref 3.5–5.1)
Sodium: 144 mmol/L (ref 135–145)

## 2019-02-09 LAB — GLUCOSE, CAPILLARY
Glucose-Capillary: 129 mg/dL — ABNORMAL HIGH (ref 70–99)
Glucose-Capillary: 95 mg/dL (ref 70–99)
Glucose-Capillary: 97 mg/dL (ref 70–99)

## 2019-02-09 LAB — MAGNESIUM: Magnesium: 2 mg/dL (ref 1.7–2.4)

## 2019-02-09 MED ORDER — DILTIAZEM HCL-DEXTROSE 100-5 MG/100ML-% IV SOLN (PREMIX)
5.0000 mg/h | INTRAVENOUS | Status: DC
  Administered 2019-02-09 – 2019-02-10 (×2): 5 mg/h via INTRAVENOUS
  Filled 2019-02-09 (×4): qty 100

## 2019-02-09 MED ORDER — LACTATED RINGERS IV BOLUS
250.0000 mL | Freq: Once | INTRAVENOUS | Status: AC
  Administered 2019-02-09: 250 mL via INTRAVENOUS

## 2019-02-09 MED ORDER — DILTIAZEM HCL 60 MG PO TABS: 30.0000 mg | ORAL_TABLET | ORAL | Status: DC | PRN

## 2019-02-09 NOTE — Consult Note (Signed)
   [facility] CM Inpatient Consult   02/09/2019  Richard [name] 1941-06-02 [id number]   Follow up: update disposition  Chart review reveals patient is for skilled nursing for rehab and awaiting [facility] insurance approval.  Patient needs will be followed at facility if approved.  Will sign off at disposition/transition, if no further [facility] Care Management needs are assessed.  Natividad Brood, RN BSN [facility] Liaison  [phone] business mobile phone Toll free office [phone]  Fax number: [phone] [email or web address]

## 2019-02-09 NOTE — TOC Progression Note (Signed)
Transition of Care [facility]) - Progression Note    Patient Details  Name: Richard [name] MRN: [id number] Date of Birth: Oct 08, 1940  Transition [facility]) CM/SW [place], LCSW Phone Number: 02/09/2019, 1:29 PM  Clinical Narrative:    CSW still waiting on insurance approval for [facility].    Expected Discharge Plan: [place] Barriers to Discharge: Ship broker, Continued Medical Work up  Expected Discharge Plan and Services Expected Discharge Plan: [place] In-house Referral: Clinical Social Work Discharge Planning Services: NA Post Acute Care Choice: [place] Living arrangements for the past 2 months: Apartment Expected Discharge Date: 02/08/19               DME Arranged: N/A DME Agency: NA       HH Arranged: NA HH Agency: NA         Social Determinants of Health (SDOH) Interventions    Readmission Risk Interventions No flowsheet data found.

## 2019-02-09 NOTE — Progress Notes (Signed)
Patient ID: Richard [name], male   DOB: 03-02-1941, 78 y.o.   MRN: [id number]  PROGRESS NOTE    Richard [name]  IRJ:[id number] DOB: 09/25/1940 DOA: 01/30/2019 PCP: [name], NP   Brief Narrative:  78 year old male with stage III non-small cell cancer on chemotherapy, essential hypertension, hyperlipidemia, dementia, overall poor historian presented on 01/30/2019 with complaints of shortness of breath.  CT showed obstructive mass in the right lung base along with pulmonary embolism with right heart strain.  There was also a large right upper lobe mass with collapse of the right middle lobe.  He was started on broad-spectrum antibiotics and anticoagulation.  Hospital course was complicated by atrial fibrillation with RVR, initially started on Cardizem which caused hypotension therefore was switched to p.o. amiodarone as per cardiology recommendations.  Palliative care team was also consulted.  Assessment & Plan:   Sepsis: Present on admission due to pneumonia Postobstructive pneumonia Stage III squamous cell lung cancer -Initially treated with IV vancomycin and cefepime.  Vancomycin discontinued.  Completed cefepime followed by Augmentin course. -Sepsis has resolved.  Cultures negative so far. -Patient would like to proceed chemo/radiation as offered by Dr. Julien Nordmann. -Palliative care also evaluated the patient during the hospitalization: Family not interested in hospice at this time.  Suggest outpatient palliative care follow-up.  Acute pulmonary evaluation with cor pulmonale -Continue Xarelto.  Echo showed normal ejection fraction with elevated right heart pressures.  Hypernatremia and hyperchloremia -DC IV fluids.  Sodium level improving.  Paroxysmal A. fib with RVR -During the hospitalization, patient was initially started on Cardizem which caused long pauses and hypotension and was subsequently discontinued and switched to oral amiodarone by cardiology. -Overnight on 02/08/2019,  patient went into rapid ventricular rate again and was started on Cardizem drip.  Cardiology has been reconsulted.  Follow recommendations.  Acute urinary retention -Foley catheter was placed on 01/31/2019 which was subsequently removed.  Continue Flomax  Hyperlipidemia Continue statin  Moderate to severe protein calorie malnutrition--follow nutrition recommendations.  Continue to encourage oral intake  Generalized deconditioning -PT recommends SNF.  Social worker following.  Overall prognosis is guarded to poor.  DVT prophylaxis: Xarelto Code Status: DNR Family Communication: None at bedside Disposition Plan: SNF once bed is available  Consultants: Cardiology/palliative care  Procedures: None  Antimicrobials:  Anti-infectives (From admission, onward)   Start     Dose/Rate Route Frequency Ordered Stop   02/04/19 1030  amoxicillin-clavulanate (AUGMENTIN) 875-125 MG per tablet 1 tablet  Status:  Discontinued     1 tablet Oral Every 12 hours 02/04/19 0945 02/07/19 1020   02/03/19 1400  ceFEPIme (MAXIPIME) 2 g in sodium chloride 0.9 % 100 mL IVPB  Status:  Discontinued     2 g 200 mL/hr over 30 Minutes Intravenous Every 8 hours 02/03/19 0919 02/04/19 0945   01/31/19 1800  vancomycin (VANCOCIN) IVPB 1000 mg/200 mL premix  Status:  Discontinued     1,000 mg 200 mL/hr over 60 Minutes Intravenous Every 24 hours 01/30/19 1611 02/02/19 1510   01/31/19 0600  ceFEPIme (MAXIPIME) 2 g in sodium chloride 0.9 % 100 mL IVPB  Status:  Discontinued     2 g 200 mL/hr over 30 Minutes Intravenous Every 12 hours 01/30/19 1611 02/03/19 0919   01/30/19 1600  vancomycin (VANCOCIN) 1,500 mg in sodium chloride 0.9 % 500 mL IVPB     1,500 mg 250 mL/hr over 120 Minutes Intravenous  Once 01/30/19 1538 01/30/19 2038   01/30/19 1600  ceFEPIme (MAXIPIME) 2 g in  sodium chloride 0.9 % 100 mL IVPB     2 g 200 mL/hr over 30 Minutes Intravenous  Once 01/30/19 1538 01/30/19 1635       Subjective: Patient seen  and examined at bedside.  He is awake, answers some questions but is slightly confused.  Very poor historian.  Currently on Cardizem drip which was started overnight as per nursing staff.  No overnight fever or vomiting.  Objective: Vitals:   02/09/19 0710 02/09/19 0820 02/09/19 0836 02/09/19 1035  BP: (!) 138/107 (!) 103/58 117/72   Pulse: (!) 163 73 69   Resp:  (!) 26 18   Temp:  97.8 F (36.6 C) 97.7 F (36.5 C) (!) 97.5 F (36.4 C)  TempSrc:  Oral Oral Oral  SpO2: 100% 99% 97%   Weight:      Height:        Intake/Output Summary (Last 24 hours) at 02/09/2019 1353 Last data filed at 02/09/2019 0636 Gross per 24 hour  Intake 2125.3 ml  Output -  Net 2125.3 ml   Filed Weights   01/30/19 1210 01/31/19 0900  Weight: 69.1 kg 68.1 kg    Examination:  General exam: Appears calm and comfortable.  Awake, very thinly built.  Looks chronically ill.  Very poor historian.  Slightly confused. Respiratory system: Bilateral decreased breath sounds at bases with some scattered crackles Cardiovascular system: S1 & S2 heard, currently rate controlled Gastrointestinal system: Abdomen is nondistended, soft and nontender. Normal bowel sounds heard. Extremities: No cyanosis, clubbing, edema  Central nervous system: Awake but slightly confused no focal neurological deficits. Moving extremities Skin: No rashes, lesions or ulcers Psychiatry: Could not be assessed because of mental status.    Data Reviewed: I have personally reviewed following labs and imaging studies  CBC: Recent Labs  Lab 02/04/19 0302 02/05/19 0325 02/06/19 1152 02/07/19 1100 02/08/19 0337  WBC 12.8* 13.7* 12.7* 12.4* 10.3  HGB 10.0* 11.3* 11.3* 11.0* 10.2*  HCT 30.7* 35.6* 34.3* 34.3* 31.6*  MCV 62.3* 63.0* 61.5* 62.6* 62.3*  PLT 222 255 320 412* 621*   Basic Metabolic Panel: Recent Labs  Lab 02/05/19 0325 02/06/19 1152 02/07/19 1100 02/08/19 0337 02/09/19 0232  NA 141 142 147* 149* 144  K 3.4* 4.1 3.9  3.4* 4.8  CL 111 110 116* 118* 114*  CO2 20* 22 20* 23 19*  GLUCOSE 100* 114* 101* 94 110*  BUN 8 13 13 13 10   CREATININE 0.74 0.65 0.90 0.94 0.69  CALCIUM 8.4* 8.8* 9.5 9.3 8.9  MG  --  2.1 2.3 2.2 2.0   GFR: Estimated Creatinine Clearance: 75.7 mL/min (by C-G formula based on SCr of 0.69 mg/dL). Liver Function Tests: No results for input(s): AST, ALT, ALKPHOS, BILITOT, PROT, ALBUMIN in the last 168 hours. No results for input(s): LIPASE, AMYLASE in the last 168 hours. No results for input(s): AMMONIA in the last 168 hours. Coagulation Profile: No results for input(s): INR, PROTIME in the last 168 hours. Cardiac Enzymes: No results for input(s): CKTOTAL, CKMB, CKMBINDEX, TROPONINI in the last 168 hours. BNP (last 3 results) No results for input(s): PROBNP in the last 8760 hours. HbA1C: No results for input(s): HGBA1C in the last 72 hours. CBG: Recent Labs  Lab 02/06/19 2207 02/08/19 1639 02/08/19 2328 02/09/19 0841 02/09/19 1230  GLUCAP 117* 118* 108* 129* 95   Lipid Profile: No results for input(s): CHOL, HDL, LDLCALC, TRIG, CHOLHDL, LDLDIRECT in the last 72 hours. Thyroid Function Tests: No results for input(s): TSH, T4TOTAL, FREET4,  T3FREE, THYROIDAB in the last 72 hours. Anemia Panel: No results for input(s): VITAMINB12, FOLATE, FERRITIN, TIBC, IRON, RETICCTPCT in the last 72 hours. Sepsis Labs: No results for input(s): PROCALCITON, LATICACIDVEN in the last 168 hours.  Recent Results (from the past 240 hour(s))  Blood culture (routine x 2)     Status: None   Collection Time: 01/30/19  3:59 PM   Specimen: BLOOD  Result Value Ref Range Status   Specimen Description BLOOD RIGHT ANTECUBITAL  Final   Special Requests   Final    BOTTLES DRAWN AEROBIC AND ANAEROBIC Blood Culture results may not be optimal due to an inadequate volume of blood received in culture bottles   Culture   Final    NO GROWTH 5 DAYS Performed at [facility], [place] [address],  [place], [place] [address]    Report Status 02/04/2019 FINAL  Final  Blood culture (routine x 2)     Status: None   Collection Time: 01/30/19  4:04 PM   Specimen: BLOOD  Result Value Ref Range Status   Specimen Description BLOOD LEFT ANTECUBITAL  Final   Special Requests   Final    BOTTLES DRAWN AEROBIC AND ANAEROBIC Blood Culture results may not be optimal due to an inadequate volume of blood received in culture bottles   Culture   Final    NO GROWTH 5 DAYS Performed at [facility], [place] [address] [place], [place] [address]    Report Status 02/04/2019 FINAL  Final  MRSA PCR Screening     Status: None   Collection Time: 01/31/19  6:18 PM   Specimen: Nasal Mucosa; Nasopharyngeal  Result Value Ref Range Status   MRSA by PCR NEGATIVE NEGATIVE Final    Comment:        The GeneXpert MRSA Assay (FDA approved for NASAL specimens only), is one component of a comprehensive MRSA colonization surveillance program. It is not intended to diagnose MRSA infection nor to guide or monitor treatment for MRSA infections. Performed at [facility], [place] [address], [place], [place] [address]          Radiology Studies: No results found.      Scheduled Meds: . amiodarone  200 mg Oral Daily  . atorvastatin  20 mg Oral QPM  . docusate sodium  200 mg Oral QHS  . midodrine  5 mg Oral BID WC  . rivaroxaban  15 mg Oral BID   Followed by  . [START ON 02/24/2019] rivaroxaban  20 mg Oral Q supper  . tamsulosin  0.4 mg Oral QHS   Continuous Infusions: . sodium chloride Stopped (02/02/19 1003)  . diltiazem (CARDIZEM) infusion 10 mg/hr (02/09/19 0488)     LOS: 10 days        Aline August, MD [facility] 02/09/2019, 1:53 PM

## 2019-02-09 NOTE — Progress Notes (Signed)
This note also relates to the following rows which could not be included: Resp - Cannot attach notes to unvalidated device data ECG Heart Rate - Cannot attach notes to unvalidated device data  Bolus given is not effective, result communicated to MD. New order for 12 lead ECG.

## 2019-02-09 NOTE — Progress Notes (Signed)
ANTICOAGULATION CONSULT NOTE - Initial Consult  Pharmacy Consult for Xarelto Indication: DVT, PE  No Known Allergies  Patient Measurements: Height: 6\' 1"  (185.4 cm) Weight: 150 lb 2.1 oz (68.1 kg) IBW/kg (Calculated) : 79.9  Vital Signs: Temp: 97.7 F (36.5 C) (08/26 0836) Temp Source: Oral (08/26 0836) BP: 117/72 (08/26 0836) Pulse Rate: 69 (08/26 0836)  Labs: Recent Labs    02/06/19 1152 02/07/19 1100 02/08/19 0337 02/09/19 0232  HGB 11.3* 11.0* 10.2*  --   HCT 34.3* 34.3* 31.6*  --   PLT 320 412* 401*  --   CREATININE 0.65 0.90 0.94 0.69    Estimated Creatinine Clearance: 75.7 mL/min (by C-G formula based on SCr of 0.69 mg/dL).   Medical History: Past Medical History:  Diagnosis Date  . Dementia ([place])   . High cholesterol   . Hypertension   . Lung mass 12/2018    Assessment: Pt presented with acute bilateral PE; moderate clot burden with RHS and a RLE DVT. In addition, pt has new onset afib; new start amiodarone. No AC PTA. On 8/20 pt was transitioned from IV heparin to Xarelto.  8/25: Hbg 10.2/Hct 31.6 (down slightly), Plt 401 (down from 412)   Plan:  Continue Xarelto 15mg  BID x 21d, then 20mg  daily starting 9/10 Monitor for s/sx of bleeding   Gwenlyn Fudge - Student PharmD 02/09/2019,9:13 AM

## 2019-02-09 NOTE — Care Management Important Message (Signed)
Important Message  Patient Details  Name: Richard [name] MRN: [id number] Date of Birth: 11-25-1940   Medicare Important Message Given:  Yes     Memory Argue 02/09/2019, 3:51 PM

## 2019-02-09 NOTE — Progress Notes (Addendum)
Progress Note  Patient Name: Richard [name] Date of Encounter: 02/09/2019  Primary Cardiologist:  Candee Furbish, MD  Subjective   Pt responds to verbal, denies CP or SOB  Inpatient Medications    Scheduled Meds: . amiodarone  200 mg Oral Daily  . atorvastatin  20 mg Oral QPM  . docusate sodium  200 mg Oral QHS  . midodrine  5 mg Oral BID WC  . rivaroxaban  15 mg Oral BID   Followed by  . [START ON 02/24/2019] rivaroxaban  20 mg Oral Q supper  . tamsulosin  0.4 mg Oral QHS   Continuous Infusions: . sodium chloride Stopped (02/02/19 1003)  . dextrose 5 % and 0.45% NaCl 100 mL/hr at 02/08/19 2257  . diltiazem (CARDIZEM) infusion 10 mg/hr (02/09/19 0712)   PRN Meds: sodium chloride, acetaminophen **OR** acetaminophen, polyethylene glycol   Vital Signs    Vitals:   02/09/19 0639 02/09/19 0710 02/09/19 0820 02/09/19 0836  BP: (!) 96/46 (!) 138/107 (!) 103/58 117/72  Pulse: (!) 135 (!) 163 73 69  Resp:   (!) 26 18  Temp:   97.8 F (36.6 C) 97.7 F (36.5 C)  TempSrc:   Oral Oral  SpO2: 99% 100% 99% 97%  Weight:      Height:        Intake/Output Summary (Last 24 hours) at 02/09/2019 1004 Last data filed at 02/09/2019 0636 Gross per 24 hour  Intake 2125.3 ml  Output -  Net 2125.3 ml   Filed Weights   01/30/19 1210 01/31/19 0900  Weight: 69.1 kg 68.1 kg   Last Weight  Most recent update: 01/31/2019  9:06 AM   Weight  68.1 kg (150 lb 2.1 oz)           Weight change:    Telemetry    SR>>rapid atrial flutter>>SR - Personally Reviewed  ECG    08/26, atrial flutter w/ 2:1 conduction, HR 138 - Personally Reviewed  Physical Exam   General: frail, elderly, male appearing in no acute distress. Head: Normocephalic, atraumatic.  Neck: Supple without bruits, JVD not elevated. Lungs:  Resp regular and unlabored, CTA. Heart: RRR, S1, S2, no S3, S4, or murmur; no rub. Abdomen: Soft, non-tender, non-distended with normoactive bowel sounds. No hepatomegaly. No  rebound/guarding. No obvious abdominal masses. Extremities: No clubbing, cyanosis, no edema. Distal pedal pulses are 2+ bilaterally. Neuro: Alert and oriented X 2. Moves all extremities spontaneously. Psych: Normal affect.  Labs    Hematology Recent Labs  Lab 02/06/19 1152 02/07/19 1100 02/08/19 0337  WBC 12.7* 12.4* 10.3  RBC 5.58 5.48 5.07  HGB 11.3* 11.0* 10.2*  HCT 34.3* 34.3* 31.6*  MCV 61.5* 62.6* 62.3*  MCH 20.3* 20.1* 20.1*  MCHC 32.9 32.1 32.3  RDW 18.7* 18.8* 18.7*  PLT 320 412* 401*    Chemistry Recent Labs  Lab 02/07/19 1100 02/08/19 0337 02/09/19 0232  NA 147* 149* 144  K 3.9 3.4* 4.8  CL 116* 118* 114*  CO2 20* 23 19*  GLUCOSE 101* 94 110*  BUN 13 13 10   CREATININE 0.90 0.94 0.69  CALCIUM 9.5 9.3 8.9  GFRNONAA >60 >60 >60  GFRAA >60 >60 >60  ANIONGAP 11 8 11      Radiology    Ct Chest W Contrast  Result Date: 02/05/2019 CLINICAL DATA:  Lung cancer, assess treatment response EXAM: CT CHEST WITH CONTRAST TECHNIQUE: Multidetector CT imaging of the chest was performed during intravenous contrast administration. CONTRAST:  91mL OMNIPAQUE IOHEXOL 300 MG/ML  SOLN COMPARISON:  CT chest, 01/30/2019, PET-CT, 01/04/2019, CT chest, 12/14/2018 FINDINGS: Cardiovascular: Aortic atherosclerosis. Normal heart size. No pericardial effusion. There is bilateral pulmonary embolus as seen on prior examination dated 01/30/2019, generally in the segmental to subsegmental vessels. Although this examination is not tailored to evaluate the pulmonary arteries, the burden of embolus appears to be somewhat decreased when compared to prior examination, particularly in the left lower lobe pulmonary artery (series 3, image 90). Mediastinum/Nodes: No significant change in prominent pretracheal lymph nodes measuring up to 1.0 cm in short axis (series 3, image 75). Thyroid gland, trachea, and esophagus demonstrate no significant findings. Lungs/Pleura: There is a spiculated perihilar mass of  the right lung with obstruction of the right middle lobe bronchi and total atelectasis/postobstructive consolidation of the right middle lobe. The dominant mass measures at least 6.7 x 5.5 x 5.6 cm (series 3, image 93, series 6, image 58), previously 5.4 x 5.6 x 3.8 cm when measured similarly on initial CT examination dated 12/14/2018. Moderate centrilobular emphysema. Moderate right pleural effusion, substantially enlarged when compared to prior examination. There is irregular subpleural airspace disease of the dependent left lower lobe distal to pulmonary embolus (series 3, image 113). No change in a 5 mm nonspecific pulmonary nodule of the left lower lobe (series 4, image 114). Upper Abdomen: No acute abnormality. Musculoskeletal: No chest wall mass or suspicious bone lesions identified. IMPRESSION: 1. There is a spiculated perihilar mass of the right lung with obstruction of the right middle lobe bronchi and total atelectasis/postobstructive consolidation of the right middle lobe. The dominant mass measures at least 6.7 x 5.5 x 5.6 cm (series 3, image 93, series 6, image 58), previously 5.4 x 5.6 x 3.8 cm when measured similarly on initial CT examination dated 12/14/2018. 2. No significant change in prominent pretracheal lymph nodes measuring up to 1.0 cm in short axis (series 3, image 75). 3. Unchanged 5 mm nonspecific pulmonary nodule of the left lower lobe (series 4, image 114). Attention on follow-up. 4. There is bilateral pulmonary embolus as seen on prior examination dated 01/30/2019, generally in the segmental to subsegmental vessels. Although this examination is not tailored to evaluate the pulmonary arteries, the burden of embolus appears to be somewhat decreased when compared to prior examination, particularly in the left lower lobe pulmonary artery (series 3, image 90). 5. There is irregular subpleural airspace disease of the dependent left lower lobe distal to pulmonary embolus (series 3, image 113),  concerning for pulmonary infarction. 6. Moderate right pleural effusion, substantially enlarged when compared to prior examination. 7.  Emphysema. 8.  Aortic atherosclerosis and coronary artery disease. Electronically Signed   By: Eddie Candle M.D.   On: 02/05/2019 17:22     Cardiac Studies   ECHO:  01/31/2019  1. The left ventricle has normal systolic function with an ejection fraction of 60-65%. The cavity size was normal. Left ventricular diastolic Doppler parameters are indeterminate. No evidence of left ventricular regional wall motion abnormalities.  2. The right ventricle has normal systolic function. The cavity was normal. There is no increase in right ventricular wall thickness. Right ventricular systolic pressure is severely elevated (6mmHg)  3. Tricuspid valve regurgitation is moderate-severe.  4. The aorta is normal unless otherwise noted.  5. Tachycardia, sinus, 118bpm.  LE DOPPLERS: 01/31/2019 Summary: Right: Findings consistent with acute deep vein thrombosis involving the right common femoral vein. No cystic structure found in the popliteal fossa. Left: There is no evidence of deep vein thrombosis in the  lower extremity. No cystic structure found in the popliteal fossa.   Patient Profile     78 y.o. male w/ hx non-small cell lung carcinoma treated with carboplatin / paclitaxel, hypertension, hyperlipidemia and advanced dementia was admitted 08/16 with severe sepsis with PNA in setting of large lung mass, lactic acidosis, aucte PE, right heart strain, DVT, and acute hypoxic respiratory failure.  Assessment & Plan    1. Atrial flutter, RVR - sudden onset, no obvious precipitating factor - likely to have recurrent episodes, but may decrease as amio loads - no sx reported - Plan: continue amiodarone at 200 mg qd for palliative purposes. If has recurrence, can give po Cardizem 30 mg q 2 hr, up to 120 mg. Ok to use IV Cardizem in-hospital, but would change to po at or before  d/c.   2. PE/DVT - he is on treatment dose Rivaroxaban, 15 mg bid - per IM/Pharmacy  Otherwise, per IM Principal Problem:   Severe sepsis ([place]) Active Problems:   Postobstructive pneumonia   Acute pulmonary embolism ([facility])   Acute respiratory failure with hypoxia ([place])   AKI (acute kidney injury) ([place])   Palliative care by specialist   DNR (do not resuscitate) discussion   Atrial fibrillation with RVR ([place])  Signed, Rosaria Ferries , PA-C 10:04 AM 02/09/2019 Pager: [phone]  Personally seen and examined. Agree with above.  Comfortable in bed RRR Continue amio 200 QD Dilt 30 PO can be used if aflutter returns.   Candee Furbish, MD

## 2019-02-09 NOTE — Progress Notes (Signed)
Cardizem gtt titrated based on vital signs per order rate increased by 5mg /hr. Gtt now runinng at 60ml/hr

## 2019-02-09 NOTE — Progress Notes (Signed)
RN notified on report from night shift RN Sherlynn Stalls) that vital signs qualified pt's MEW scale as red level at 0630. RN took vital signs during morning report hand off. RN paged MD and notified him of pt's acuity due to cardizem drip & vital signs. Pt changed from tele to progressive care. RN followed up and did vital signs again once progressive care monitor placed on pt. Pt's baseline improving as HR and BP become closer to stable range. RN will continue to monitor pt.

## 2019-02-09 NOTE — Progress Notes (Signed)
This note also relates to the following rows which could not be included: Resp - Cannot attach notes to unvalidated device data ECG Heart Rate - Cannot attach notes to unvalidated device data  Mews score and HR communicated to MD, new order received for bolus 250 ml LR

## 2019-02-09 NOTE — Plan of Care (Signed)
  Problem: Health Behavior/Discharge Planning: Goal: Ability to manage health-related needs will improve Outcome: Progressing   Problem: Clinical Measurements: Goal: Will remain free from infection Outcome: Progressing   

## 2019-02-10 LAB — CBC
HCT: 33.1 % — ABNORMAL LOW (ref 39.0–52.0)
Hemoglobin: 10.6 g/dL — ABNORMAL LOW (ref 13.0–17.0)
MCH: 20 pg — ABNORMAL LOW (ref 26.0–34.0)
MCHC: 32 g/dL (ref 30.0–36.0)
MCV: 62.5 fL — ABNORMAL LOW (ref 80.0–100.0)
Platelets: 508 10*3/uL — ABNORMAL HIGH (ref 150–400)
RBC: 5.3 MIL/uL (ref 4.22–5.81)
RDW: 19.3 % — ABNORMAL HIGH (ref 11.5–15.5)
WBC: 10.8 10*3/uL — ABNORMAL HIGH (ref 4.0–10.5)
nRBC: 0.6 % — ABNORMAL HIGH (ref 0.0–0.2)

## 2019-02-10 LAB — BASIC METABOLIC PANEL
Anion gap: 11 (ref 5–15)
BUN: 9 mg/dL (ref 8–23)
CO2: 22 mmol/L (ref 22–32)
Calcium: 9.2 mg/dL (ref 8.9–10.3)
Chloride: 113 mmol/L — ABNORMAL HIGH (ref 98–111)
Creatinine, Ser: 0.99 mg/dL (ref 0.61–1.24)
GFR calc Af Amer: >60 mL/min (ref >60)
GFR calc non Af Amer: >60 mL/min (ref >60)
Glucose, Bld: 126 mg/dL — ABNORMAL HIGH (ref 70–99)
Potassium: 3.3 mmol/L — ABNORMAL LOW (ref 3.5–5.1)
Sodium: 146 mmol/L — ABNORMAL HIGH (ref 135–145)

## 2019-02-10 LAB — SARS CORONAVIRUS 2 BY RT PCR (HOSPITAL ORDER, PERFORMED IN ~~LOC~~ HOSPITAL LAB): SARS Coronavirus 2: NEGATIVE

## 2019-02-10 LAB — MAGNESIUM: Magnesium: 2.1 mg/dL (ref 1.7–2.4)

## 2019-02-10 MED ORDER — AMIODARONE HCL 200 MG PO TABS
200.0000 mg | ORAL_TABLET | Freq: Two times a day (BID) | ORAL | Status: DC
  Administered 2019-02-11: 200 mg via ORAL
  Filled 2019-02-10 (×2): qty 1

## 2019-02-10 NOTE — Progress Notes (Signed)
Pt. Is refusing all oral meds tonight.  Doesn't want them.  He says "Im done".  He requested a soda.  And that's all he will take for now.

## 2019-02-10 NOTE — Progress Notes (Signed)
Progress Note  Patient Name: Richard [name] Date of Encounter: 02/10/2019  Primary Cardiologist: Candee Furbish, MD   Subjective   "Can you get me a cigarette".  Laying comfortably in bed.  No chest pain, no palpitations.  Inpatient Medications    Scheduled Meds: . amiodarone  200 mg Oral Daily  . atorvastatin  20 mg Oral QPM  . docusate sodium  200 mg Oral QHS  . midodrine  5 mg Oral BID WC  . rivaroxaban  15 mg Oral BID   Followed by  . [START ON 02/24/2019] rivaroxaban  20 mg Oral Q supper  . tamsulosin  0.4 mg Oral QHS   Continuous Infusions: . sodium chloride Stopped (02/02/19 1003)  . diltiazem (CARDIZEM) infusion 5 mg/hr (02/09/19 1450)   PRN Meds: sodium chloride, acetaminophen **OR** acetaminophen, diltiazem, polyethylene glycol   Vital Signs    Vitals:   02/09/19 1035 02/09/19 2100 02/10/19 0217 02/10/19 0600  BP:  106/60 112/63 (!) 103/47  Pulse:  67 (!) 51 (!) 55  Resp:  (!) 26 18 16   Temp: (!) 97.5 F (36.4 C) 97.6 F (36.4 C) 99 F (37.2 C) 98.2 F (36.8 C)  TempSrc: Oral Oral Oral Oral  SpO2:  94% 95% 96%  Weight:      Height:        Intake/Output Summary (Last 24 hours) at 02/10/2019 1055 Last data filed at 02/09/2019 1800 Gross per 24 hour  Intake 95.08 ml  Output -  Net 95.08 ml   Last 3 Weights 01/31/2019 01/30/2019 01/12/2019  Weight (lbs) 150 lb 2.1 oz 152 lb 5.4 oz 152 lb 4.8 oz  Weight (kg) 68.1 kg 69.1 kg 69.083 kg      Telemetry    Had another episode of atrial flutter with rapid ventricular response abrupt onset yesterday late evening and terminated earlier this morning.- Personally Reviewed  ECG    Atrial flutter- Personally Reviewed  Physical Exam   GEN: No acute distress.  Laying comfortably on his side in bed Neck: No JVD Cardiac: RRR, no murmurs, rubs, or gallops.  Respiratory: Clear to auscultation bilaterally. GI: Soft, nontender, non-distended  MS: No edema; No deformity. Neuro:  Nonfocal  Psych: Calm but confused   Labs    High Sensitivity Troponin:  No results for input(s): TROPONINIHS in the last 720 hours.    Chemistry Recent Labs  Lab 02/07/19 1100 02/08/19 0337 02/09/19 0232  NA 147* 149* 144  K 3.9 3.4* 4.8  CL 116* 118* 114*  CO2 20* 23 19*  GLUCOSE 101* 94 110*  BUN 13 13 10   CREATININE 0.90 0.94 0.69  CALCIUM 9.5 9.3 8.9  GFRNONAA >60 >60 >60  GFRAA >60 >60 >60  ANIONGAP 11 8 11      Hematology Recent Labs  Lab 02/06/19 1152 02/07/19 1100 02/08/19 0337  WBC 12.7* 12.4* 10.3  RBC 5.58 5.48 5.07  HGB 11.3* 11.0* 10.2*  HCT 34.3* 34.3* 31.6*  MCV 61.5* 62.6* 62.3*  MCH 20.3* 20.1* 20.1*  MCHC 32.9 32.1 32.3  RDW 18.7* 18.8* 18.7*  PLT 320 412* 401*    BNPNo results for input(s): BNP, PROBNP in the last 168 hours.   DDimer No results for input(s): DDIMER in the last 168 hours.   Radiology    No results found.  Cardiac Studies   Normal EF, Increased pulmonary pressures  Patient Profile     78 y.o. male  non-small cell lung carcinoma treated with carboplatin / paclitaxel, hypertension, hyperlipidemia and  advanced dementia was admitted 08/16 with severe sepsis with PNA in setting of large lung mass, lactic acidosis, aucte PE, right heart strain, DVT, and acute hypoxic respiratory failure.  Assessment & Plan    Paroxysmal atrial flutter with rapid ventricular response - Happened again last night.  Back in sinus rhythm currently. -Continue with amiodarone 200 mg but we will increase it to 200 mg twice daily. - Consider changing his IV diltiazem over to long-acting diltiazem CD 120mg  QD  PE DVT - On rivaroxaban  Secondary pulmonary hypertension - Seems quite comfortable currently.  Continue to treat underlying conditions.      For questions or updates, please contact [place] Please consult [email or web address] for contact info under        Signed, Candee Furbish, MD  02/10/2019, 10:55 AM

## 2019-02-10 NOTE — TOC Progression Note (Signed)
Transition of Care [facility]) - Progression Note    Patient Details  Name: Richard [name] MRN: [id number] Date of Birth: 06-16-1941  [facility] of [facility] CM/SW [place], LCSW Phone Number: 02/10/2019, 4:36 PM  Clinical Narrative:    CSW was informed that insurance has denied SNF. [place] [employer] is filing an appeal.    Expected Discharge Plan: [place] Barriers to Discharge: Ship broker, Continued Medical Work up  Expected Discharge Plan and Services Expected Discharge Plan: [place] In-house Referral: Clinical Social Work Discharge Planning Services: NA Post Acute Care Choice: [place] Living arrangements for the past 2 months: Apartment Expected Discharge Date: 02/08/19               DME Arranged: N/A DME Agency: NA       HH Arranged: NA HH Agency: NA         Social Determinants of Health (SDOH) Interventions    Readmission Risk Interventions No flowsheet data found.

## 2019-02-10 NOTE — Progress Notes (Signed)
Patient ID: Richard [name], male   DOB: [birth year], 78 y.o.   MRN: [id number]  PROGRESS NOTE    Richard [name]  KDX:[id number] DOB: 1941/02/16 DOA: 01/30/2019 PCP: [name], NP   Brief Narrative:  78 year old male with stage III non-small cell cancer on chemotherapy, essential hypertension, hyperlipidemia, dementia, overall poor historian presented on 01/30/2019 with complaints of shortness of breath.  CT showed obstructive mass in the right lung base along with pulmonary embolism with right heart strain.  There was also a large right upper lobe mass with collapse of the right middle lobe.  He was started on broad-spectrum antibiotics and anticoagulation.  Hospital course was complicated by atrial fibrillation with RVR, initially started on Cardizem which caused hypotension therefore was switched to p.o. amiodarone as per cardiology recommendations.  Palliative care team was also consulted.  Assessment & Plan:   Sepsis: Present on admission due to pneumonia Postobstructive pneumonia Stage III squamous cell lung cancer -Initially treated with IV vancomycin and cefepime.  Vancomycin discontinued.  Completed cefepime followed by Augmentin course. -Sepsis has resolved.  Cultures negative so far. -Patient would like to proceed chemo/radiation as offered by Dr. Julien Nordmann. -Palliative care also evaluated the patient during the hospitalization: Family not interested in hospice at this time.  Suggest outpatient palliative care follow-up.  Acute pulmonary evaluation with cor pulmonale -Continue Xarelto.  Echo showed normal ejection fraction with elevated right heart pressures.  Hypernatremia and hyperchloremia -DC IV fluids.  Sodium level improving.  Paroxysmal A. fib with RVR -During the hospitalization, patient was initially started on Cardizem which caused long pauses and hypotension and was subsequently discontinued and switched to oral amiodarone by cardiology. -Overnight on 02/08/2019,  patient went into rapid ventricular rate again and was started on Cardizem drip.  Cardiology was reconsulted.  Heart rate much improved.  Off Cardizem drip.  Continue amiodarone.  Acute urinary retention -Foley catheter was placed on 01/31/2019 which was subsequently removed.  Continue Flomax  Hyperlipidemia Continue statin  Moderate to severe protein calorie malnutrition--follow nutrition recommendations.  Continue to encourage oral intake  Generalized deconditioning -PT recommends SNF.  Social worker following.  Overall prognosis is guarded to poor.  DVT prophylaxis: Xarelto Code Status: DNR Family Communication: None at bedside Disposition Plan: SNF once bed is available  Consultants: Cardiology/palliative care  Procedures: None  Antimicrobials:  Anti-infectives (From admission, onward)   Start     Dose/Rate Route Frequency Ordered Stop   02/04/19 1030  amoxicillin-clavulanate (AUGMENTIN) 875-125 MG per tablet 1 tablet  Status:  Discontinued     1 tablet Oral Every 12 hours 02/04/19 0945 02/07/19 1020   02/03/19 1400  ceFEPIme (MAXIPIME) 2 g in sodium chloride 0.9 % 100 mL IVPB  Status:  Discontinued     2 g 200 mL/hr over 30 Minutes Intravenous Every 8 hours 02/03/19 0919 02/04/19 0945   01/31/19 1800  vancomycin (VANCOCIN) IVPB 1000 mg/200 mL premix  Status:  Discontinued     1,000 mg 200 mL/hr over 60 Minutes Intravenous Every 24 hours 01/30/19 1611 02/02/19 1510   01/31/19 0600  ceFEPIme (MAXIPIME) 2 g in sodium chloride 0.9 % 100 mL IVPB  Status:  Discontinued     2 g 200 mL/hr over 30 Minutes Intravenous Every 12 hours 01/30/19 1611 02/03/19 0919   01/30/19 1600  vancomycin (VANCOCIN) 1,500 mg in sodium chloride 0.9 % 500 mL IVPB     1,500 mg 250 mL/hr over 120 Minutes Intravenous  Once 01/30/19 1538 01/30/19 2038  01/30/19 1600  ceFEPIme (MAXIPIME) 2 g in sodium chloride 0.9 % 100 mL IVPB     2 g 200 mL/hr over 30 Minutes Intravenous  Once 01/30/19 1538 01/30/19  1635       Subjective: Patient seen and examined at bedside.  He is a poor historian.  Awake but slightly confused.  Does not want to talk much.  No overnight fever or vomiting reported. Objective: Vitals:   02/09/19 1035 02/09/19 2100 02/10/19 0217 02/10/19 0600  BP:  106/60 112/63 (!) 103/47  Pulse:  67 (!) 51 (!) 55  Resp:  (!) 26 18 16   Temp: (!) 97.5 F (36.4 C) 97.6 F (36.4 C) 99 F (37.2 C) 98.2 F (36.8 C)  TempSrc: Oral Oral Oral Oral  SpO2:  94% 95% 96%  Weight:      Height:        Intake/Output Summary (Last 24 hours) at 02/10/2019 1102 Last data filed at 02/09/2019 1800 Gross per 24 hour  Intake 95.08 ml  Output -  Net 95.08 ml   Filed Weights   01/30/19 1210 01/31/19 0900  Weight: 69.1 kg 68.1 kg    Examination:  General exam: No acute distress.  Awake, very thinly built.  Looks chronically ill.  Very poor historian.  Slightly confused. Respiratory system: Bilateral decreased breath sounds at bases with some scattered crackles.  No wheezing Cardiovascular system: S1 & S2 heard, intermittent bradycardia  gastrointestinal system: Abdomen is nondistended, soft and nontender. Normal bowel sounds heard. Extremities: No cyanosis, clubbing, edema    Data Reviewed: I have personally reviewed following labs and imaging studies  CBC: Recent Labs  Lab 02/04/19 0302 02/05/19 0325 02/06/19 1152 02/07/19 1100 02/08/19 0337  WBC 12.8* 13.7* 12.7* 12.4* 10.3  HGB 10.0* 11.3* 11.3* 11.0* 10.2*  HCT 30.7* 35.6* 34.3* 34.3* 31.6*  MCV 62.3* 63.0* 61.5* 62.6* 62.3*  PLT 222 255 320 412* 287*   Basic Metabolic Panel: Recent Labs  Lab 02/05/19 0325 02/06/19 1152 02/07/19 1100 02/08/19 0337 02/09/19 0232  NA 141 142 147* 149* 144  K 3.4* 4.1 3.9 3.4* 4.8  CL 111 110 116* 118* 114*  CO2 20* 22 20* 23 19*  GLUCOSE 100* 114* 101* 94 110*  BUN 8 13 13 13 10   CREATININE 0.74 0.65 0.90 0.94 0.69  CALCIUM 8.4* 8.8* 9.5 9.3 8.9  MG  --  2.1 2.3 2.2 2.0    GFR: Estimated Creatinine Clearance: 75.7 mL/min (by C-G formula based on SCr of 0.69 mg/dL). Liver Function Tests: No results for input(s): AST, ALT, ALKPHOS, BILITOT, PROT, ALBUMIN in the last 168 hours. No results for input(s): LIPASE, AMYLASE in the last 168 hours. No results for input(s): AMMONIA in the last 168 hours. Coagulation Profile: No results for input(s): INR, PROTIME in the last 168 hours. Cardiac Enzymes: No results for input(s): CKTOTAL, CKMB, CKMBINDEX, TROPONINI in the last 168 hours. BNP (last 3 results) No results for input(s): PROBNP in the last 8760 hours. HbA1C: No results for input(s): HGBA1C in the last 72 hours. CBG: Recent Labs  Lab 02/08/19 1639 02/08/19 2328 02/09/19 0841 02/09/19 1230 02/09/19 1635  GLUCAP 118* 108* 129* 95 97   Lipid Profile: No results for input(s): CHOL, HDL, LDLCALC, TRIG, CHOLHDL, LDLDIRECT in the last 72 hours. Thyroid Function Tests: No results for input(s): TSH, T4TOTAL, FREET4, T3FREE, THYROIDAB in the last 72 hours. Anemia Panel: No results for input(s): VITAMINB12, FOLATE, FERRITIN, TIBC, IRON, RETICCTPCT in the last 72 hours. Sepsis Labs:  No results for input(s): PROCALCITON, LATICACIDVEN in the last 168 hours.  Recent Results (from the past 240 hour(s))  MRSA PCR Screening     Status: None   Collection Time: 01/31/19  6:18 PM   Specimen: Nasal Mucosa; Nasopharyngeal  Result Value Ref Range Status   MRSA by PCR NEGATIVE NEGATIVE Final    Comment:        The GeneXpert MRSA Assay (FDA approved for NASAL specimens only), is one component of a comprehensive MRSA colonization surveillance program. It is not intended to diagnose MRSA infection nor to guide or monitor treatment for MRSA infections. Performed at [facility], [place] [address], [place], [place] [address]          Radiology Studies: No results found.      Scheduled Meds: . amiodarone  200 mg Oral BID  . atorvastatin  20 mg Oral  QPM  . docusate sodium  200 mg Oral QHS  . midodrine  5 mg Oral BID WC  . rivaroxaban  15 mg Oral BID   Followed by  . [START ON 02/24/2019] rivaroxaban  20 mg Oral Q supper  . tamsulosin  0.4 mg Oral QHS   Continuous Infusions: . sodium chloride Stopped (02/02/19 1003)  . diltiazem (CARDIZEM) infusion 5 mg/hr (02/09/19 1450)     LOS: 11 days        Aline August, MD [facility] 02/10/2019, 11:02 AM

## 2019-02-11 ENCOUNTER — Telehealth: Payer: Self-pay | Admitting: *Deleted

## 2019-02-11 DIAGNOSIS — Z743 Need for continuous supervision: Secondary | ICD-10-CM | POA: Diagnosis not present

## 2019-02-11 DIAGNOSIS — R5381 Other malaise: Secondary | ICD-10-CM | POA: Diagnosis not present

## 2019-02-11 DIAGNOSIS — Z741 Need for assistance with personal care: Secondary | ICD-10-CM | POA: Diagnosis not present

## 2019-02-11 DIAGNOSIS — C3490 Malignant neoplasm of unspecified part of unspecified bronchus or lung: Secondary | ICD-10-CM | POA: Diagnosis not present

## 2019-02-11 DIAGNOSIS — R339 Retention of urine, unspecified: Secondary | ICD-10-CM | POA: Diagnosis not present

## 2019-02-11 DIAGNOSIS — I2699 Other pulmonary embolism without acute cor pulmonale: Secondary | ICD-10-CM | POA: Diagnosis not present

## 2019-02-11 DIAGNOSIS — R279 Unspecified lack of coordination: Secondary | ICD-10-CM | POA: Diagnosis not present

## 2019-02-11 DIAGNOSIS — R6520 Severe sepsis without septic shock: Secondary | ICD-10-CM | POA: Diagnosis not present

## 2019-02-11 DIAGNOSIS — I2601 Septic pulmonary embolism with acute cor pulmonale: Secondary | ICD-10-CM | POA: Diagnosis not present

## 2019-02-11 DIAGNOSIS — R0902 Hypoxemia: Secondary | ICD-10-CM | POA: Diagnosis not present

## 2019-02-11 DIAGNOSIS — I4891 Unspecified atrial fibrillation: Secondary | ICD-10-CM | POA: Diagnosis not present

## 2019-02-11 DIAGNOSIS — Z7901 Long term (current) use of anticoagulants: Secondary | ICD-10-CM | POA: Diagnosis not present

## 2019-02-11 DIAGNOSIS — E785 Hyperlipidemia, unspecified: Secondary | ICD-10-CM | POA: Diagnosis not present

## 2019-02-11 DIAGNOSIS — E43 Unspecified severe protein-calorie malnutrition: Secondary | ICD-10-CM | POA: Diagnosis not present

## 2019-02-11 DIAGNOSIS — R6889 Other general symptoms and signs: Secondary | ICD-10-CM | POA: Diagnosis not present

## 2019-02-11 DIAGNOSIS — J189 Pneumonia, unspecified organism: Secondary | ICD-10-CM | POA: Diagnosis not present

## 2019-02-11 DIAGNOSIS — R1311 Dysphagia, oral phase: Secondary | ICD-10-CM | POA: Diagnosis not present

## 2019-02-11 DIAGNOSIS — N179 Acute kidney failure, unspecified: Secondary | ICD-10-CM | POA: Diagnosis not present

## 2019-02-11 DIAGNOSIS — F0390 Unspecified dementia without behavioral disturbance: Secondary | ICD-10-CM | POA: Diagnosis not present

## 2019-02-11 DIAGNOSIS — I480 Paroxysmal atrial fibrillation: Secondary | ICD-10-CM | POA: Diagnosis not present

## 2019-02-11 DIAGNOSIS — R489 Unspecified symbolic dysfunctions: Secondary | ICD-10-CM | POA: Diagnosis not present

## 2019-02-11 DIAGNOSIS — Z86711 Personal history of pulmonary embolism: Secondary | ICD-10-CM | POA: Diagnosis not present

## 2019-02-11 DIAGNOSIS — E7800 Pure hypercholesterolemia, unspecified: Secondary | ICD-10-CM | POA: Diagnosis not present

## 2019-02-11 DIAGNOSIS — I10 Essential (primary) hypertension: Secondary | ICD-10-CM | POA: Diagnosis not present

## 2019-02-11 DIAGNOSIS — A419 Sepsis, unspecified organism: Secondary | ICD-10-CM | POA: Diagnosis not present

## 2019-02-11 DIAGNOSIS — R2689 Other abnormalities of gait and mobility: Secondary | ICD-10-CM | POA: Diagnosis not present

## 2019-02-11 DIAGNOSIS — C342 Malignant neoplasm of middle lobe, bronchus or lung: Secondary | ICD-10-CM | POA: Diagnosis present

## 2019-02-11 DIAGNOSIS — R69 Illness, unspecified: Secondary | ICD-10-CM | POA: Diagnosis not present

## 2019-02-11 DIAGNOSIS — Z79899 Other long term (current) drug therapy: Secondary | ICD-10-CM | POA: Diagnosis not present

## 2019-02-11 DIAGNOSIS — E440 Moderate protein-calorie malnutrition: Secondary | ICD-10-CM | POA: Diagnosis not present

## 2019-02-11 DIAGNOSIS — C3491 Malignant neoplasm of unspecified part of right bronchus or lung: Secondary | ICD-10-CM | POA: Diagnosis not present

## 2019-02-11 DIAGNOSIS — D649 Anemia, unspecified: Secondary | ICD-10-CM | POA: Diagnosis not present

## 2019-02-11 DIAGNOSIS — Z5111 Encounter for antineoplastic chemotherapy: Secondary | ICD-10-CM | POA: Diagnosis not present

## 2019-02-11 DIAGNOSIS — I959 Hypotension, unspecified: Secondary | ICD-10-CM | POA: Diagnosis not present

## 2019-02-11 DIAGNOSIS — R531 Weakness: Secondary | ICD-10-CM | POA: Diagnosis not present

## 2019-02-11 DIAGNOSIS — M6281 Muscle weakness (generalized): Secondary | ICD-10-CM | POA: Diagnosis not present

## 2019-02-11 LAB — BASIC METABOLIC PANEL
Anion gap: 10 (ref 5–15)
BUN: 9 mg/dL (ref 8–23)
CO2: 23 mmol/L (ref 22–32)
Calcium: 8.9 mg/dL (ref 8.9–10.3)
Chloride: 113 mmol/L — ABNORMAL HIGH (ref 98–111)
Creatinine, Ser: 0.77 mg/dL (ref 0.61–1.24)
GFR calc Af Amer: >60 mL/min (ref >60)
GFR calc non Af Amer: >60 mL/min (ref >60)
Glucose, Bld: 91 mg/dL (ref 70–99)
Potassium: 3.3 mmol/L — ABNORMAL LOW (ref 3.5–5.1)
Sodium: 146 mmol/L — ABNORMAL HIGH (ref 135–145)

## 2019-02-11 LAB — MAGNESIUM: Magnesium: 2 mg/dL (ref 1.7–2.4)

## 2019-02-11 MED ORDER — DILTIAZEM HCL ER COATED BEADS 120 MG PO CP24: 120.0000 mg | ORAL_CAPSULE | Freq: Every day | ORAL | 0 refills | Status: DC

## 2019-02-11 MED ORDER — RIVAROXABAN (XARELTO) VTE STARTER PACK (15 & 20 MG): ORAL_TABLET | ORAL | 0 refills | Status: DC

## 2019-02-11 MED ORDER — POTASSIUM CHLORIDE CRYS ER 20 MEQ PO TBCR
40.0000 meq | EXTENDED_RELEASE_TABLET | Freq: Once | ORAL | Status: AC
  Administered 2019-02-11: 40 meq via ORAL
  Filled 2019-02-11: qty 2

## 2019-02-11 MED ORDER — TAMSULOSIN HCL 0.4 MG PO CAPS: 0.4000 mg | ORAL_CAPSULE | Freq: Every day | ORAL | 0 refills | Status: DC

## 2019-02-11 MED ORDER — AMIODARONE HCL 200 MG PO TABS: 200.0000 mg | ORAL_TABLET | Freq: Two times a day (BID) | ORAL | 0 refills | Status: DC

## 2019-02-11 MED ORDER — MIDODRINE HCL 5 MG PO TABS: 5.0000 mg | ORAL_TABLET | Freq: Two times a day (BID) | ORAL | 0 refills | Status: DC

## 2019-02-11 NOTE — Progress Notes (Signed)
Patient ID: Richard [name], male   DOB: [birth year], 78 y.o.   MRN: [id number]  PROGRESS NOTE    Richard [name]  DTO:[id number] DOB: 24-Apr-1941 DOA: 01/30/2019 PCP: [name], NP   Brief Narrative:  78 year old male with stage III non-small cell cancer on chemotherapy, essential hypertension, hyperlipidemia, dementia, overall poor historian presented on 01/30/2019 with complaints of shortness of breath.  CT showed obstructive mass in the right lung base along with pulmonary embolism with right heart strain.  There was also a large right upper lobe mass with collapse of the right middle lobe.  He was started on broad-spectrum antibiotics and anticoagulation.  Hospital course was complicated by atrial fibrillation with RVR, initially started on Cardizem which caused hypotension therefore was switched to p.o. amiodarone as per cardiology recommendations.  Palliative care team was also consulted.  Assessment & Plan:   Sepsis: Present on admission due to pneumonia Postobstructive pneumonia Stage III squamous cell lung cancer -Initially treated with IV vancomycin and cefepime.  Vancomycin discontinued.  Completed cefepime followed by Augmentin course. -Sepsis has resolved.  Cultures negative so far. -Patient would like to proceed chemo/radiation as offered by Dr. Julien Nordmann. -Palliative care also evaluated the patient during the hospitalization: Family not interested in hospice at this time.  Suggest outpatient palliative care follow-up.  Acute pulmonary evaluation with cor pulmonale -Continue Xarelto.  Echo showed normal ejection fraction with elevated right heart pressures.  Hypernatremia and hyperchloremia -Off IV steroids.  Sodium level improving.  No labs for today.  Paroxysmal A. fib with RVR -During the hospitalization, patient was initially started on Cardizem which caused long pauses and hypotension and was subsequently discontinued and switched to oral amiodarone by cardiology.  -Overnight on 02/08/2019, patient went into rapid ventricular rate again and was started on Cardizem drip.  Cardiology was reconsulted.  Heart rate much improved.  Off Cardizem drip.  Continue increased dose of amiodarone. -Patient had refused his oral meds on 02/10/2019 and he was put back on Cardizem drip.  This morning his heart rate is controlled.  Will DC Cardizem drip and hopefully he will agree to take his oral meds.  Acute urinary retention -Foley catheter was placed on 01/31/2019 which was subsequently removed.  Continue Flomax  Hyperlipidemia -Continue statin  Moderate to severe protein calorie malnutrition--follow nutrition recommendations.  Continue to encourage oral intake  Generalized deconditioning -PT recommends SNF.  Social worker following.  Overall prognosis is guarded to poor.  DVT prophylaxis: Xarelto Code Status: DNR Family Communication: None at bedside Disposition Plan: SNF once bed is available  Consultants: Cardiology/palliative care  Procedures: None  Antimicrobials:  Anti-infectives (From admission, onward)   Start     Dose/Rate Route Frequency Ordered Stop   02/04/19 1030  amoxicillin-clavulanate (AUGMENTIN) 875-125 MG per tablet 1 tablet  Status:  Discontinued     1 tablet Oral Every 12 hours 02/04/19 0945 02/07/19 1020   02/03/19 1400  ceFEPIme (MAXIPIME) 2 g in sodium chloride 0.9 % 100 mL IVPB  Status:  Discontinued     2 g 200 mL/hr over 30 Minutes Intravenous Every 8 hours 02/03/19 0919 02/04/19 0945   01/31/19 1800  vancomycin (VANCOCIN) IVPB 1000 mg/200 mL premix  Status:  Discontinued     1,000 mg 200 mL/hr over 60 Minutes Intravenous Every 24 hours 01/30/19 1611 02/02/19 1510   01/31/19 0600  ceFEPIme (MAXIPIME) 2 g in sodium chloride 0.9 % 100 mL IVPB  Status:  Discontinued     2 g 200 mL/hr over  30 Minutes Intravenous Every 12 hours 01/30/19 1611 02/03/19 0919   01/30/19 1600  vancomycin (VANCOCIN) 1,500 mg in sodium chloride 0.9 % 500 mL  IVPB     1,500 mg 250 mL/hr over 120 Minutes Intravenous  Once 01/30/19 1538 01/30/19 2038   01/30/19 1600  ceFEPIme (MAXIPIME) 2 g in sodium chloride 0.9 % 100 mL IVPB     2 g 200 mL/hr over 30 Minutes Intravenous  Once 01/30/19 1538 01/30/19 1635       Subjective: Patient seen and examined at bedside.  He is a poor historian.  He wants to go home.  Slightly confused.  As per the nursing staff, he had refused his oral meds yesterday afternoon.  No overnight fever or vomiting. Objective: Vitals:   02/10/19 2047 02/11/19 0033 02/11/19 0432 02/11/19 0844  BP: (!) 110/51 (!) 94/51 (!) 120/57 116/67  Pulse: 77 72 69 71  Resp: (!) 32 (!) 29 18 (!) 25  Temp: 98.8 F (37.1 C) 98.2 F (36.8 C) 98.8 F (37.1 C) 97.7 F (36.5 C)  TempSrc: Oral Oral Oral Oral  SpO2: 96% 94% 92% 94%  Weight:      Height:        Intake/Output Summary (Last 24 hours) at 02/11/2019 0946 Last data filed at 02/11/2019 0843 Gross per 24 hour  Intake 128.48 ml  Output 600 ml  Net -471.52 ml   Filed Weights   01/30/19 1210 01/31/19 0900  Weight: 69.1 kg 68.1 kg    Examination:  General exam: No distress.  Awake, very thinly built.  Looks chronically ill.  Very poor historian.  Still slightly confused. Respiratory system: Bilateral decreased breath sounds at bases with some crackles.  Intermittently tachypneic cardiovascular system: S1 & S2 heard, rate controlled gastrointestinal system: Abdomen is nondistended, soft and nontender. Normal bowel sounds heard. Extremities: No cyanosis, edema    Data Reviewed: I have personally reviewed following labs and imaging studies  CBC: Recent Labs  Lab 02/05/19 0325 02/06/19 1152 02/07/19 1100 02/08/19 0337 02/10/19 1406  WBC 13.7* 12.7* 12.4* 10.3 10.8*  HGB 11.3* 11.3* 11.0* 10.2* 10.6*  HCT 35.6* 34.3* 34.3* 31.6* 33.1*  MCV 63.0* 61.5* 62.6* 62.3* 62.5*  PLT 255 320 412* 401* 831*   Basic Metabolic Panel: Recent Labs  Lab 02/06/19 1152 02/07/19  1100 02/08/19 0337 02/09/19 0232 02/10/19 1406  NA 142 147* 149* 144 146*  K 4.1 3.9 3.4* 4.8 3.3*  CL 110 116* 118* 114* 113*  CO2 22 20* 23 19* 22  GLUCOSE 114* 101* 94 110* 126*  BUN 13 13 13 10 9   CREATININE 0.65 0.90 0.94 0.69 0.99  CALCIUM 8.8* 9.5 9.3 8.9 9.2  MG 2.1 2.3 2.2 2.0 2.1   GFR: Estimated Creatinine Clearance: 61.1 mL/min (by C-G formula based on SCr of 0.99 mg/dL). Liver Function Tests: No results for input(s): AST, ALT, ALKPHOS, BILITOT, PROT, ALBUMIN in the last 168 hours. No results for input(s): LIPASE, AMYLASE in the last 168 hours. No results for input(s): AMMONIA in the last 168 hours. Coagulation Profile: No results for input(s): INR, PROTIME in the last 168 hours. Cardiac Enzymes: No results for input(s): CKTOTAL, CKMB, CKMBINDEX, TROPONINI in the last 168 hours. BNP (last 3 results) No results for input(s): PROBNP in the last 8760 hours. HbA1C: No results for input(s): HGBA1C in the last 72 hours. CBG: Recent Labs  Lab 02/08/19 1639 02/08/19 2328 02/09/19 0841 02/09/19 1230 02/09/19 1635  GLUCAP 118* 108* 129* 95 97  Lipid Profile: No results for input(s): CHOL, HDL, LDLCALC, TRIG, CHOLHDL, LDLDIRECT in the last 72 hours. Thyroid Function Tests: No results for input(s): TSH, T4TOTAL, FREET4, T3FREE, THYROIDAB in the last 72 hours. Anemia Panel: No results for input(s): VITAMINB12, FOLATE, FERRITIN, TIBC, IRON, RETICCTPCT in the last 72 hours. Sepsis Labs: No results for input(s): PROCALCITON, LATICACIDVEN in the last 168 hours.  Recent Results (from the past 240 hour(s))  SARS Coronavirus 2 [facility] order, Performed in [place] hospital lab)     Status: None   Collection Time: 02/10/19 11:29 AM  Result Value Ref Range Status   SARS Coronavirus 2 NEGATIVE NEGATIVE Final    Comment: (NOTE) If result is NEGATIVE SARS-CoV-2 target nucleic acids are NOT DETECTED. The SARS-CoV-2 RNA is generally detectable in upper and lower   respiratory specimens during the acute phase of infection. The lowest  concentration of SARS-CoV-2 viral copies this assay can detect is 250  copies / mL. A negative result does not preclude SARS-CoV-2 infection  and should not be used as the sole basis for treatment or other  patient management decisions.  A negative result may occur with  improper specimen collection / handling, submission of specimen other  than nasopharyngeal swab, presence of viral mutation(s) within the  areas targeted by this assay, and inadequate number of viral copies  (<250 copies / mL). A negative result must be combined with clinical  observations, patient history, and epidemiological information. If result is POSITIVE SARS-CoV-2 target nucleic acids are DETECTED. The SARS-CoV-2 RNA is generally detectable in upper and lower  respiratory specimens dur ing the acute phase of infection.  Positive  results are indicative of active infection with SARS-CoV-2.  Clinical  correlation with patient history and other diagnostic information is  necessary to determine patient infection status.  Positive results do  not rule out bacterial infection or co-infection with other viruses. If result is PRESUMPTIVE POSTIVE SARS-CoV-2 nucleic acids MAY BE PRESENT.   A presumptive positive result was obtained on the submitted specimen  and confirmed on repeat testing.  While 2019 novel coronavirus  (SARS-CoV-2) nucleic acids may be present in the submitted sample  additional confirmatory testing may be necessary for epidemiological  and / or clinical management purposes  to differentiate between  SARS-CoV-2 and other Sarbecovirus currently known to infect humans.  If clinically indicated additional testing with an alternate test  methodology [phone]) is advised. The SARS-CoV-2 RNA is generally  detectable in upper and lower respiratory sp ecimens during the acute  phase of infection. The expected result is Negative. Fact  Sheet for Patients:  StrictlyIdeas.no Fact Sheet for Healthcare Providers: BankingDealers.co.za This test is not yet approved or cleared by the [place] FDA and has been authorized for detection and/or diagnosis of SARS-CoV-2 by FDA under an Emergency Use Authorization (EUA).  This EUA will remain in effect (meaning this test can be used) for the duration of the COVID-19 declaration under Section 564(b)(1) of the Act, 21 U.S.C. section 360bbb-3(b)(1), unless the authorization is terminated or revoked sooner. Performed at [facility], [place] [address], [place], [place] [address]          Radiology Studies: No results found.      Scheduled Meds: . amiodarone  200 mg Oral BID  . atorvastatin  20 mg Oral QPM  . docusate sodium  200 mg Oral QHS  . midodrine  5 mg Oral BID WC  . potassium chloride  40 mEq Oral Once  .  rivaroxaban  15 mg Oral BID   Followed by  . [START ON 02/24/2019] rivaroxaban  20 mg Oral Q supper  . tamsulosin  0.4 mg Oral QHS   Continuous Infusions: . sodium chloride Stopped (02/02/19 1003)  . diltiazem (CARDIZEM) infusion Stopped (02/11/19 0843)     LOS: 12 days        Aline August, MD [facility] 02/11/2019, 9:46 AM

## 2019-02-11 NOTE — TOC Transition Note (Signed)
Transition of Care [facility]) - CM/SW Discharge Note   Patient Details  Name: Jrake Rodriquez MRN: [id number] Date of Birth: [birth year]  Transition of Care [facility]) CM/SW Contact:  Benard Halsted, LCSW Phone Number: 02/11/2019, 4:10 PM   Clinical Narrative:    Patient will DC to: [place] Anticipated DC date: 02/11/19 Family notified: Daughter, Naval architect by: Corey Harold   Per MD patient ready for DC to [facility]. RN, patient, patient's family, and facility notified of DC. Discharge Summary and FL2 sent to facility. RN to call report prior to discharge ([phone]). DC packet on chart. Ambulance transport requested for patient.   CSW will sign off for now as social work intervention is no longer needed. Please consult [place] again if new needs arise.  Cedric Fishman, LCSW Clinical Social Worker [phone]    Final next level of care: Skilled Nursing Facility Barriers to Discharge: No Barriers Identified   Patient Goals and CMS Choice Patient states their goals for this hospitalization and ongoing recovery are:: Rehab CMS Medicare.gov Compare Post Acute Care list provided to:: Patient Represenative (must comment)(Daughter) Choice offered to / list presented to : Adult Children  Discharge Placement   Existing [name] confirmed : 02/11/19          Patient chooses bed at: [facility] Patient to be transferred to facility by: [facility] Name of family member notified: Daughter, [name] Patient and family notified of of transfer: 02/11/19  Discharge Plan and Services In-house Referral: Clinical Social Work Discharge Planning Services: NA Post Acute Care Choice: [facility]          DME Arranged: N/A DME Agency: NA       HH Arranged: NA HH Agency: NA        Social Determinants of Health ([place]) Interventions     Readmission Risk Interventions No flowsheet data found.

## 2019-02-11 NOTE — Progress Notes (Signed)
Progress Note  Patient Name: Richard [name] Date of Encounter: 02/11/2019  Primary Cardiologist: Candee Furbish, MD   Subjective   Laying comfortably in bed, no complaints, no chest pain.  Inpatient Medications    Scheduled Meds: . amiodarone  200 mg Oral BID  . atorvastatin  20 mg Oral QPM  . docusate sodium  200 mg Oral QHS  . midodrine  5 mg Oral BID WC  . potassium chloride  40 mEq Oral Once  . rivaroxaban  15 mg Oral BID   Followed by  . [START ON 02/24/2019] rivaroxaban  20 mg Oral Q supper  . tamsulosin  0.4 mg Oral QHS   Continuous Infusions: . sodium chloride Stopped (02/02/19 1003)  . diltiazem (CARDIZEM) infusion Stopped (02/11/19 0843)   PRN Meds: sodium chloride, acetaminophen **OR** acetaminophen, diltiazem, polyethylene glycol   Vital Signs    Vitals:   02/10/19 2047 02/11/19 0033 02/11/19 0432 02/11/19 0844  BP: (!) 110/51 (!) 94/51 (!) 120/57 116/67  Pulse: 77 72 69 71  Resp: (!) 32 (!) 29 18 (!) 25  Temp: 98.8 F (37.1 C) 98.2 F (36.8 C) 98.8 F (37.1 C) 97.7 F (36.5 C)  TempSrc: Oral Oral Oral Oral  SpO2: 96% 94% 92% 94%  Weight:      Height:        Intake/Output Summary (Last 24 hours) at 02/11/2019 1033 Last data filed at 02/11/2019 0843 Gross per 24 hour  Intake 128.48 ml  Output 600 ml  Net -471.52 ml   Last 3 Weights 01/31/2019 01/30/2019 01/12/2019  Weight (lbs) 150 lb 2.1 oz 152 lb 5.4 oz 152 lb 4.8 oz  Weight (kg) 68.1 kg 69.1 kg 69.083 kg      Telemetry    No further episodes of atrial flutter, no pauses- Personally Reviewed  ECG    No new- Personally Reviewed  Physical Exam   GEN: No acute distress.  Elderly Neck: No JVD Cardiac: RRR, no murmurs, rubs, or gallops.  Respiratory: Clear to auscultation bilaterally. GI: Soft, nontender, non-distended  MS: No edema; No deformity. Neuro:  Nonfocal  Psych: Pleasant  Labs    High Sensitivity Troponin:  No results for input(s): TROPONINIHS in the last 720 hours.     Chemistry Recent Labs  Lab 02/08/19 0337 02/09/19 0232 02/10/19 1406  NA 149* 144 146*  K 3.4* 4.8 3.3*  CL 118* 114* 113*  CO2 23 19* 22  GLUCOSE 94 110* 126*  BUN 13 10 9   CREATININE 0.94 0.69 0.99  CALCIUM 9.3 8.9 9.2  GFRNONAA >60 >60 >60  GFRAA >60 >60 >60  ANIONGAP 8 11 11      Hematology Recent Labs  Lab 02/07/19 1100 02/08/19 0337 02/10/19 1406  WBC 12.4* 10.3 10.8*  RBC 5.48 5.07 5.30  HGB 11.0* 10.2* 10.6*  HCT 34.3* 31.6* 33.1*  MCV 62.6* 62.3* 62.5*  MCH 20.1* 20.1* 20.0*  MCHC 32.1 32.3 32.0  RDW 18.8* 18.7* 19.3*  PLT 412* 401* 508*    BNPNo results for input(s): BNP, PROBNP in the last 168 hours.   DDimer No results for input(s): DDIMER in the last 168 hours.   Radiology    No results found.  Cardiac Studies   Normal EF, increased pulmonary pressures  Patient Profile     78 y.o. male with paroxysmal atrial flutter, lung cancer, acute PE, on anticoagulation, dementia  Assessment & Plan    Paroxysmal atrial flutter with rapid ventricular response - Now on amiodarone 200 mg  twice a day. - Diltiazem CD 120 QD  PE DVT -Rivaroxaban  Secondary pulmonary hypertension -PE  We will go ahead and sign off.  Continue with current medications.  We will see in follow-up.     For questions or updates, please contact [facility] Please consult [email or web address] for contact info under        Signed, Candee Furbish, MD  02/11/2019, 10:33 AM

## 2019-02-11 NOTE — Discharge Summary (Signed)
Physician Discharge Summary  Richard [name]:[id number] DOB: 02-24-1941 DOA: 01/30/2019  PCP: [name], NP  Admit date: 01/30/2019 Discharge date: 02/11/2019  Admitted From: Home Disposition: SNF  Recommendations for Outpatient Follow-up:  1. Follow up with PCP in 1 week with repeat CBC/BMP 2. Outpatient follow-up with cardiology/oncology/palliative care 3. 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  CT showed obstructive mass in the right lung base along with pulmonary embolism with right heart strain.  There was also a large right upper lobe mass with collapse of the right middle lobe.  He was started on broad-spectrum antibiotics and anticoagulation.  Hospital course was complicated by atrial fibrillation with RVR, initially started on Cardizem which caused hypotension therefore was switched to p.o. amiodarone as per cardiology recommendations.  Palliative care team was also consulted.  Patient has had issues with A. fib with RVR which required intravenous Cardizem intermittently.  Currently on twice a day amiodarone and oral Xarelto.  Cardiology has signed off.  PT recommended SNF placement.  Will be discharged to SNF once bed is available.  He has completed antibiotic course.  Discharge Diagnoses:   Sepsis: Present on admission due to pneumonia Postobstructive pneumonia Stage III squamous cell lung cancer -Initially treated with IV vancomycin and cefepime.  Vancomycin discontinued.  Completed cefepime followed by Augmentin course. -Sepsis has resolved.  Cultures negative so far. -Patient would like to proceed  chemo/radiation as offered by Dr. Julien Nordmann. -Palliative care also evaluated the patient during the hospitalization: Family not interested in hospice at this time.  Suggest outpatient palliative care follow-up.  Acute pulmonary evaluation with cor pulmonale -Continue Xarelto.  Echo showed normal ejection fraction with elevated right heart pressures.  Hypernatremia and hyperchloremia -Off IV steroids.  Sodium level improving.    Outpatient follow-up.  Paroxysmal A. fib with RVR -During the hospitalization, patient was initially started on Cardizem which caused long pauses and hypotension and was subsequently discontinued and switched to oral amiodarone by cardiology. -Overnight on 02/08/2019, patient went into rapid ventricular rate again and was started on Cardizem drip.  Cardiology was reconsulted.  Heart rate much improved.  Off Cardizem drip.  Continue increased dose of amiodarone. -Patient had refused his oral meds on 02/10/2019 and he was put back on Cardizem drip.  This morning his heart rate is controlled.   Cardizem drip discontinued.  Cardiology recommends amiodarone twice a day with Cardizem CD 120 mg daily along with Xarelto.  Cardiology signed off.  Outpatient follow-up with cardiology.    Acute urinary retention -Foley catheter was placed on 01/31/2019 which was subsequently removed.  Continue Flomax  Hyperlipidemia -Continue statin  Moderate to severe protein calorie malnutrition--follow nutrition recommendations.  Continue to encourage oral intake  Generalized deconditioning -PT recommends SNF.    Discharge Instructions  Discharge Instructions    Ambulatory referral to Cardiology   Complete by: As directed    Ambulatory referral to Oncology   Complete by: As directed    Diet - low sodium heart healthy   Complete by: As directed    Increase activity slowly   Complete by: As directed      Allergies as of 02/11/2019   No Known Allergies  Medication List     STOP taking these medications   aspirin 81 MG EC tablet Commonly known as: Aspirin 81   HYDROcodone-acetaminophen 5-325 MG tablet Commonly known as: NORCO/VICODIN   nicotine 7 mg/24hr patch Commonly known as: NICODERM CQ - dosed in mg/24 hr     TAKE these medications   amiodarone 200 MG tablet Commonly known as: PACERONE Take 1 tablet (200 mg total) by mouth 2 (two) times daily.   atorvastatin 20 MG tablet Commonly known as: LIPITOR Take 1 tablet (20 mg total) by mouth every evening.   diltiazem 120 MG 24 hr capsule Commonly known as: Cardizem CD Take 1 capsule (120 mg total) by mouth daily.   FISH OIL PO Take 1 capsule by mouth daily.   midodrine 5 MG tablet Commonly known as: PROAMATINE Take 1 tablet (5 mg total) by mouth 2 (two) times daily with a meal.   prochlorperazine 10 MG tablet Commonly known as: COMPAZINE Take 1 tablet (10 mg total) by mouth every 6 (six) hours as needed for nausea or vomiting.   Rivaroxaban 15 & 20 MG Tbpk Take as directed on package: Start with one 15mg  tablet by mouth twice a day with food. On Day 22, switch to one 20mg  tablet once a day with food.   tamsulosin 0.4 MG Caps capsule Commonly known as: FLOMAX Take 1 capsule (0.4 mg total) by mouth at bedtime.      Contact information for after-discharge care    Destination    [facility] .   Service: Skilled Nursing Contact information: [address] [place] [phone]             No Known Allergies  Consultations:  Cardiology/palliative care   Procedures/Studies: Dg Chest 1 View  Result Date: 02/01/2019 CLINICAL DATA:  Shortness of breath, altered mental status EXAM: CHEST  1 VIEW COMPARISON:  01/30/2019 FINDINGS: Consolidation in the right lower lung is stable. Underlying COPD. Left lung clear. Heart is normal size. IMPRESSION: Stable right basilar atelectasis or scar site stable right basilar  infiltrate/consolidation. Electronically Signed   By: Rolm Baptise M.D.   On: 02/01/2019 08:33   Ct Chest W Contrast  Result Date: 02/05/2019 CLINICAL DATA:  Lung cancer, assess treatment response EXAM: CT CHEST WITH CONTRAST TECHNIQUE: Multidetector CT imaging of the chest was performed during intravenous contrast administration. CONTRAST:  50mL OMNIPAQUE IOHEXOL 300 MG/ML  SOLN COMPARISON:  CT chest, 01/30/2019, PET-CT, 01/04/2019, CT chest, 12/14/2018 FINDINGS: Cardiovascular: Aortic atherosclerosis. Normal heart size. No pericardial effusion. There is bilateral pulmonary embolus as seen on prior examination dated 01/30/2019, generally in the segmental to subsegmental vessels. Although this examination is not tailored to evaluate the pulmonary arteries, the burden of embolus appears to be somewhat decreased when compared to prior examination, particularly in the left lower lobe pulmonary artery (series 3, image 90). Mediastinum/Nodes: No significant change in prominent pretracheal lymph nodes measuring up to 1.0 cm in short axis (series 3, image 75). Thyroid gland, trachea, and esophagus demonstrate no significant findings. Lungs/Pleura: There is a spiculated perihilar mass of the right lung with obstruction of the right middle lobe bronchi and total atelectasis/postobstructive consolidation of the right middle lobe. The dominant mass measures at least 6.7 x 5.5 x 5.6 cm (series 3, image 93, series 6, image 58), previously 5.4 x 5.6 x 3.8 cm when measured similarly on initial CT examination dated 12/14/2018. Moderate centrilobular emphysema. Moderate right pleural effusion, substantially enlarged when compared  to prior examination. There is irregular subpleural airspace disease of the dependent left lower lobe distal to pulmonary embolus (series 3, image 113). No change in a 5 mm nonspecific pulmonary nodule of the left lower lobe (series 4, image 114). Upper Abdomen: No acute abnormality. Musculoskeletal: No  chest wall mass or suspicious bone lesions identified. IMPRESSION: 1. There is a spiculated perihilar mass of the right lung with obstruction of the right middle lobe bronchi and total atelectasis/postobstructive consolidation of the right middle lobe. The dominant mass measures at least 6.7 x 5.5 x 5.6 cm (series 3, image 93, series 6, image 58), previously 5.4 x 5.6 x 3.8 cm when measured similarly on initial CT examination dated 12/14/2018. 2. No significant change in prominent pretracheal lymph nodes measuring up to 1.0 cm in short axis (series 3, image 75). 3. Unchanged 5 mm nonspecific pulmonary nodule of the left lower lobe (series 4, image 114). Attention on follow-up. 4. There is bilateral pulmonary embolus as seen on prior examination dated 01/30/2019, generally in the segmental to subsegmental vessels. Although this examination is not tailored to evaluate the pulmonary arteries, the burden of embolus appears to be somewhat decreased when compared to prior examination, particularly in the left lower lobe pulmonary artery (series 3, image 90). 5. There is irregular subpleural airspace disease of the dependent left lower lobe distal to pulmonary embolus (series 3, image 113), concerning for pulmonary infarction. 6. Moderate right pleural effusion, substantially enlarged when compared to prior examination. 7.  Emphysema. 8.  Aortic atherosclerosis and coronary artery disease. Electronically Signed   By: Eddie Candle M.D.   On: 02/05/2019 17:22   Ct Angio Chest Pe W And/or Wo Contrast  Result Date: 01/30/2019 CLINICAL DATA:  Initial lung cancer. Concern for pulmonary embolism or ischemia bowel. EXAM: CT ANGIOGRAPHY CHEST, ABDOMEN AND PELVIS TECHNIQUE: Multidetector CT imaging through the chest, abdomen and pelvis was performed using the standard protocol during bolus administration of intravenous contrast. Multiplanar reconstructed images and MIPs were obtained and reviewed to evaluate the vascular  anatomy. CONTRAST:  135mL OMNIPAQUE IOHEXOL 350 MG/ML SOLN COMPARISON:  CT 12/14/2018 FINDINGS: CTA CHEST FINDINGS Cardiovascular: Filling defect within the RIGHT lower lobe pulmonary artery (image 102/5) Filling defect within the LEFT lobe pulmonary arteries (image 97/5). Filling defect within the LEFT upper lobe pulmonary artery. Overall clot burden is moderate. The RIGHT ventricle ratio to LEFT ventricle ratio is greater than 1 (1.36). Of note this ratio is similar to CT of 12/14/2019 when there was no pulmonary emboli. Mediastinum/Nodes: No axillary supraclavicular adenopathy. Perihilar thickening in the RIGHT hilum similar prior. Lungs/Pleura: There is collapse of the RIGHT middle lobe. Mass in the RIGHT upper lobe seen on comparison exam which not well appreciated but measures potentially 4.7 cm on image 87/5 increased from 3.8 cm on prior. Musculoskeletal: No aggressive osseous lesion. Review of the MIP images confirms the above findings. CTA ABDOMEN AND PELVIS FINDINGS VASCULAR Aorta: No dissection or aneurysm. Celiac: Widely patent. SMA: Widely patent. Renals: Single renal artery on the LEFT and 2 renal arteries on the RIGHT all widely patent. IMA: Widely patent Inflow: Normal interval calcifications of the iliac arteries. Veins: No acute finding Review of the MIP images confirms the above findings. NON-VASCULAR Hepatobiliary: No focal hepatic lesion. Pancreas: No pancreatic inflammation. Spleen: Normal. Adrenals/Urinary Tract: adrenal glands and kidneys normal. Bladder is distended to level the umbilicus. Stomach/Bowel: Small hiatal hernia. The stomach duodenum normal. No evidence of bowel ischemia no bowel dilatation appendix normal. The  colon and rectosigmoid colon are normal. Lymphatic: No abdominopelvic lymphadenopathy Reproductive: Prostate nodular dense the base the bladder. Other: Evaluation of the pelvic floor is limited by the LEFT hip prosthetic Musculoskeletal: No aggressive osseous lesion  Review of the MIP images confirms the above findings. IMPRESSION: Chest Impression: 1. Acute pulmonary embolism in the LEFT and RIGHT lower lobe pulmonary arteries and LEFT upper lobe. Overall clot burden is moderate. 2. Positive for acute PE with CT evidence of right heart strain (RV/LV Ratio = 1.36). This ratio suggests RIGHT heart strain; however, the ratio of RIGHT ventricle to LEFT ventricle diameter is similar to CT of 12/14/2018 at which time there was no PE. 3. Large RIGHT upper lobe mass with collapse of the RIGHT middle lobe. Mass is difficult to define but appears larger than comparison exam Abdomen / Pelvis Impression: 1. No acute findings abdominal aorta. 2. No evidence of vascular compromise in the abdomen pelvis. 3. No evidence of bowel ischemia. 4. The bladder is distended.  Recommend decompression. Critical Value/emergent results were called by telephone at the time of interpretation on 01/30/2019 at 7:32 pm to Dr. ; Elnora Morrison , who verbally acknowledged these results. Electronically Signed   By: Suzy Bouchard M.D.   On: 01/30/2019 19:33   US Renal  Result Date: 01/30/2019 CLINICAL DATA:  Acute kidney injury. EXAM: RENAL / URINARY TRACT ULTRASOUND COMPLETE COMPARISON:  CT angiography earlier this day. FINDINGS: Right Kidney: Renal measurements: 11.0 x 4.3 x 6.0 cm = volume: 146 mL. Echogenicity within normal limits. No mass or hydronephrosis visualized. Left Kidney: Renal measurements: 10.7 x 6.6 x 5.0 cm = volume: 182 mL. Echogenicity within normal limits. No mass or hydronephrosis visualized. Bladder: Prominently distended, as seen on CT. No definite wall thickening. Soft tissue nodule at the bladder base measures 2.0 x 1.7 x 2.3 cm. IMPRESSION: 1. Normal sonographic appearance of the kidneys. 2. Marked urinary bladder distention is seen on CT. 3. Soft tissue nodule at the bladder base measuring 2.3 cm, on CT this appears contiguous with the prostate gland, however this communication is  not well delineated sonographically. Electronically Signed   By: Keith Rake M.D.   On: 01/30/2019 23:18   Dg Chest Portable 1 View  Result Date: 01/30/2019 CLINICAL DATA:  Shortness of breath dry cough. Increased weakness. Decreased appetite and intake. History of lung cancer. EXAM: PORTABLE CHEST 1 VIEW COMPARISON:  12/16/2018 and chest CT dated 12/14/2018. FINDINGS: Stable dense consolidation at the right lung base with a centrally obstructing mass better seen on the previous CT. Clear left lung. Normal sized heart. Minimal bilateral AC joint degenerative changes. IMPRESSION: Stable dense consolidation and centrally obstructing mass at the right lung base. Electronically Signed   By: Claudie Revering M.D.   On: 01/30/2019 13:20   Vas [place] Lower Extremity Venous (dvt)  Result Date: 01/31/2019  Lower Venous Study Indications: Pulmonary embolism.  Risk Factors: Confirmed PE. Limitations: Poor ultrasound/tissue interface and patient immobility, poor patient cooperation, combative. Comparison Study: No prior studies. Performing Technologist: Oliver Hum RVT  Examination Guidelines: A complete evaluation includes B-mode imaging, spectral Doppler, color Doppler, and power Doppler as needed of all accessible portions of each vessel. Bilateral testing is considered an integral part of a complete examination. Limited examinations for reoccurring indications may be performed as noted.  +---------+---------------+---------+-----------+----------+--------------+ RIGHT    CompressibilityPhasicitySpontaneityPropertiesSummary        +---------+---------------+---------+-----------+----------+--------------+ CFV      Partial        Yes  Yes                  Acute          +---------+---------------+---------+-----------+----------+--------------+ SFJ      Full                                                        +---------+---------------+---------+-----------+----------+--------------+ FV  Prox  Full                                                        +---------+---------------+---------+-----------+----------+--------------+ FV Mid   Full                                                        +---------+---------------+---------+-----------+----------+--------------+ FV DistalFull                                                        +---------+---------------+---------+-----------+----------+--------------+ PFV      Full                                                        +---------+---------------+---------+-----------+----------+--------------+ POP      Full           No       No                                  +---------+---------------+---------+-----------+----------+--------------+ PTV      Full                                                        +---------+---------------+---------+-----------+----------+--------------+ PERO     Full                                                        +---------+---------------+---------+-----------+----------+--------------+ EIV      Full           Yes      Yes                                 +---------+---------------+---------+-----------+----------+--------------+ CIV  Not visualized +---------+---------------+---------+-----------+----------+--------------+ Unable to visualize the common iliac vein due to patient cooperation, and combativeness.  +---------+---------------+---------+-----------+----------+-------+ LEFT     CompressibilityPhasicitySpontaneityPropertiesSummary +---------+---------------+---------+-----------+----------+-------+ CFV      Full           Yes      Yes                          +---------+---------------+---------+-----------+----------+-------+ SFJ      Full                                                 +---------+---------------+---------+-----------+----------+-------+ FV Prox   Full                                                 +---------+---------------+---------+-----------+----------+-------+ FV Mid   Full                                                 +---------+---------------+---------+-----------+----------+-------+ FV DistalFull                                                 +---------+---------------+---------+-----------+----------+-------+ PFV      Full                                                 +---------+---------------+---------+-----------+----------+-------+ POP      Full           Yes      Yes                          +---------+---------------+---------+-----------+----------+-------+ PTV      Full                                                 +---------+---------------+---------+-----------+----------+-------+ PERO     Full                                                 +---------+---------------+---------+-----------+----------+-------+     Summary: Right: Findings consistent with acute deep vein thrombosis involving the right common femoral vein. No cystic structure found in the popliteal fossa. Left: There is no evidence of deep vein thrombosis in the lower extremity. No cystic structure found in the popliteal fossa.  *See table(s) above for measurements and observations. Electronically signed by Servando Snare MD on 01/31/2019 at 2:16:43 PM.    Final    Ct Angio Abd/pel W And/or Wo Contrast  Result Date: 01/30/2019 CLINICAL DATA:  Initial lung cancer. Concern for  pulmonary embolism or ischemia bowel. EXAM: CT ANGIOGRAPHY CHEST, ABDOMEN AND PELVIS TECHNIQUE: Multidetector CT imaging through the chest, abdomen and pelvis was performed using the standard protocol during bolus administration of intravenous contrast. Multiplanar reconstructed images and MIPs were obtained and reviewed to evaluate the vascular anatomy. CONTRAST:  176mL OMNIPAQUE IOHEXOL 350 MG/ML SOLN COMPARISON:  CT 12/14/2018 FINDINGS: CTA  CHEST FINDINGS Cardiovascular: Filling defect within the RIGHT lower lobe pulmonary artery (image 102/5) Filling defect within the LEFT lobe pulmonary arteries (image 97/5). Filling defect within the LEFT upper lobe pulmonary artery. Overall clot burden is moderate. The RIGHT ventricle ratio to LEFT ventricle ratio is greater than 1 (1.36). Of note this ratio is similar to CT of 12/14/2019 when there was no pulmonary emboli. Mediastinum/Nodes: No axillary supraclavicular adenopathy. Perihilar thickening in the RIGHT hilum similar prior. Lungs/Pleura: There is collapse of the RIGHT middle lobe. Mass in the RIGHT upper lobe seen on comparison exam which not well appreciated but measures potentially 4.7 cm on image 87/5 increased from 3.8 cm on prior. Musculoskeletal: No aggressive osseous lesion. Review of the MIP images confirms the above findings. CTA ABDOMEN AND PELVIS FINDINGS VASCULAR Aorta: No dissection or aneurysm. Celiac: Widely patent. SMA: Widely patent. Renals: Single renal artery on the LEFT and 2 renal arteries on the RIGHT all widely patent. IMA: Widely patent Inflow: Normal interval calcifications of the iliac arteries. Veins: No acute finding Review of the MIP images confirms the above findings. NON-VASCULAR Hepatobiliary: No focal hepatic lesion. Pancreas: No pancreatic inflammation. Spleen: Normal. Adrenals/Urinary Tract: adrenal glands and kidneys normal. Bladder is distended to level the umbilicus. Stomach/Bowel: Small hiatal hernia. The stomach duodenum normal. No evidence of bowel ischemia no bowel dilatation appendix normal. The colon and rectosigmoid colon are normal. Lymphatic: No abdominopelvic lymphadenopathy Reproductive: Prostate nodular dense the base the bladder. Other: Evaluation of the pelvic floor is limited by the LEFT hip prosthetic Musculoskeletal: No aggressive osseous lesion Review of the MIP images confirms the above findings. IMPRESSION: Chest Impression: 1. Acute pulmonary  embolism in the LEFT and RIGHT lower lobe pulmonary arteries and LEFT upper lobe. Overall clot burden is moderate. 2. Positive for acute PE with CT evidence of right heart strain (RV/LV Ratio = 1.36). This ratio suggests RIGHT heart strain; however, the ratio of RIGHT ventricle to LEFT ventricle diameter is similar to CT of 12/14/2018 at which time there was no PE. 3. Large RIGHT upper lobe mass with collapse of the RIGHT middle lobe. Mass is difficult to define but appears larger than comparison exam Abdomen / Pelvis Impression: 1. No acute findings abdominal aorta. 2. No evidence of vascular compromise in the abdomen pelvis. 3. No evidence of bowel ischemia. 4. The bladder is distended.  Recommend decompression. Critical Value/emergent results were called by telephone at the time of interpretation on 01/30/2019 at 7:32 pm to Dr. ; Elnora Morrison , who verbally acknowledged these results. Electronically Signed   By: Suzy Bouchard M.D.   On: 01/30/2019 19:33       Subjective: Patient seen and examined at bedside.  He is a poor historian.  He wants to go home.  Slightly confused.  As per the nursing staff, he had refused his oral meds yesterday afternoon.  No overnight fever or vomiting.  Discharge Exam: Vitals:   02/11/19 1140 02/11/19 1443  BP: (!) 116/37 122/77  Pulse: 62 72  Resp: 20 (!) 23  Temp: 98 F (36.7 C) 97.9 F (36.6 C)  SpO2: 95% 94%  General exam: No distress.  Awake, very thinly built.  Looks chronically ill.  Very poor historian.  Still slightly confused. Respiratory system: Bilateral decreased breath sounds at bases with some crackles.  Intermittently tachypneic cardiovascular system: S1 & S2 heard, rate controlled gastrointestinal system: Abdomen is nondistended, soft and nontender. Normal bowel sounds heard. Extremities: No cyanosis, edema   The results of significant diagnostics from this hospitalization (including imaging, microbiology, ancillary and laboratory)  are listed below for reference.     Microbiology: Recent Results (from the past 240 hour(s))  SARS Coronavirus 2 [facility] order, Performed in [facility] hospital lab)     Status: None   Collection Time: 02/10/19 11:29 AM  Result Value Ref Range Status   SARS Coronavirus 2 NEGATIVE NEGATIVE Final    Comment: (NOTE) If result is NEGATIVE SARS-CoV-2 target nucleic acids are NOT DETECTED. The SARS-CoV-2 RNA is generally detectable in upper and lower  respiratory specimens during the acute phase of infection. The lowest  concentration of SARS-CoV-2 viral copies this assay can detect is 250  copies / mL. A negative result does not preclude SARS-CoV-2 infection  and should not be used as the sole basis for treatment or other  patient management decisions.  A negative result may occur with  improper specimen collection / handling, submission of specimen other  than nasopharyngeal swab, presence of viral mutation(s) within the  areas targeted by this assay, and inadequate number of viral copies  (<250 copies / mL). A negative result must be combined with clinical  observations, patient history, and epidemiological information. If result is POSITIVE SARS-CoV-2 target nucleic acids are DETECTED. The SARS-CoV-2 RNA is generally detectable in upper and lower  respiratory specimens dur ing the acute phase of infection.  Positive  results are indicative of active infection with SARS-CoV-2.  Clinical  correlation with patient history and other diagnostic information is  necessary to determine patient infection status.  Positive results do  not rule out bacterial infection or co-infection with other viruses. If result is PRESUMPTIVE POSTIVE SARS-CoV-2 nucleic acids MAY BE PRESENT.   A presumptive positive result was obtained on the submitted specimen  and confirmed on repeat testing.  While 2019 novel coronavirus  (SARS-CoV-2) nucleic acids may be present in the submitted sample  additional  confirmatory testing may be necessary for epidemiological  and / or clinical management purposes  to differentiate between  SARS-CoV-2 and other Sarbecovirus currently known to infect humans.  If clinically indicated additional testing with an alternate test  methodology [phone]) is advised. The SARS-CoV-2 RNA is generally  detectable in upper and lower respiratory sp ecimens during the acute  phase of infection. The expected result is Negative. Fact Sheet for Patients:  StrictlyIdeas.no Fact Sheet for Healthcare Providers: BankingDealers.co.za This test is not yet approved or cleared by the [place] FDA and has been authorized for detection and/or diagnosis of SARS-CoV-2 by FDA under an Emergency Use Authorization (EUA).  This EUA will remain in effect (meaning this test can be used) for the duration of the COVID-19 declaration under Section 564(b)(1) of the Act, 21 U.S.C. section 360bbb-3(b)(1), unless the authorization is terminated or revoked sooner. Performed at [facility], [place] [address], [place], [place] [address]      Labs: BNP (last 3 results) No results for input(s): BNP in the last 8760 hours. Basic Metabolic Panel: Recent Labs  Lab 02/07/19 1100 02/08/19 0337 02/09/19 0232 02/10/19 1406 02/11/19 1408  NA 147* 149* 144 146* 146*  K 3.9 3.4* 4.8 3.3* 3.3*  CL 116* 118* 114* 113* 113*  CO2 20* 23 19* 22 23  GLUCOSE 101* 94 110* 126* 91  BUN 13 13 10 9 9   CREATININE 0.90 0.94 0.69 0.99 0.77  CALCIUM 9.5 9.3 8.9 9.2 8.9  MG 2.3 2.2 2.0 2.1 2.0   Liver Function Tests: No results for input(s): AST, ALT, ALKPHOS, BILITOT, PROT, ALBUMIN in the last 168 hours. No results for input(s): LIPASE, AMYLASE in the last 168 hours. No results for input(s): AMMONIA in the last 168 hours. CBC: Recent Labs  Lab 02/05/19 0325 02/06/19 1152 02/07/19 1100 02/08/19 0337 02/10/19 1406  WBC 13.7* 12.7* 12.4* 10.3  10.8*  HGB 11.3* 11.3* 11.0* 10.2* 10.6*  HCT 35.6* 34.3* 34.3* 31.6* 33.1*  MCV 63.0* 61.5* 62.6* 62.3* 62.5*  PLT 255 320 412* 401* 508*   Cardiac Enzymes: No results for input(s): CKTOTAL, CKMB, CKMBINDEX, TROPONINI in the last 168 hours. BNP: Invalid input(s): POCBNP CBG: Recent Labs  Lab 02/08/19 1639 02/08/19 2328 02/09/19 0841 02/09/19 1230 02/09/19 1635  GLUCAP 118* 108* 129* 95 97   D-Dimer No results for input(s): DDIMER in the last 72 hours. Hgb A1c No results for input(s): HGBA1C in the last 72 hours. Lipid Profile No results for input(s): CHOL, HDL, LDLCALC, TRIG, CHOLHDL, LDLDIRECT in the last 72 hours. Thyroid function studies No results for input(s): TSH, T4TOTAL, T3FREE, THYROIDAB in the last 72 hours.  Invalid input(s): FREET3 Anemia work up No results for input(s): VITAMINB12, FOLATE, FERRITIN, TIBC, IRON, RETICCTPCT in the last 72 hours. Urinalysis    Component Value Date/Time   COLORURINE YELLOW 01/30/2019 1325   APPEARANCEUR HAZY (A) 01/30/2019 1325   LABSPEC 1.014 01/30/2019 1325   PHURINE 5.0 01/30/2019 1325   GLUCOSEU 50 (A) 01/30/2019 1325   HGBUR SMALL (A) 01/30/2019 1325   BILIRUBINUR NEGATIVE 01/30/2019 1325   KETONESUR NEGATIVE 01/30/2019 1325   PROTEINUR NEGATIVE 01/30/2019 1325   NITRITE NEGATIVE 01/30/2019 1325   LEUKOCYTESUR NEGATIVE 01/30/2019 1325   Sepsis Labs Invalid input(s): PROCALCITONIN,  WBC,  LACTICIDVEN Microbiology Recent Results (from the past 240 hour(s))  SARS Coronavirus 2 [facility] order, Performed in [place] hospital lab)     Status: None   Collection Time: 02/10/19 11:29 AM  Result Value Ref Range Status   SARS Coronavirus 2 NEGATIVE NEGATIVE Final    Comment: (NOTE) If result is NEGATIVE SARS-CoV-2 target nucleic acids are NOT DETECTED. The SARS-CoV-2 RNA is generally detectable in upper and lower  respiratory specimens during the acute phase of infection. The lowest  concentration of SARS-CoV-2  viral copies this assay can detect is 250  copies / mL. A negative result does not preclude SARS-CoV-2 infection  and should not be used as the sole basis for treatment or other  patient management decisions.  A negative result may occur with  improper specimen collection / handling, submission of specimen other  than nasopharyngeal swab, presence of viral mutation(s) within the  areas targeted by this assay, and inadequate number of viral copies  (<250 copies / mL). A negative result must be combined with clinical  observations, patient history, and epidemiological information. If result is POSITIVE SARS-CoV-2 target nucleic acids are DETECTED. The SARS-CoV-2 RNA is generally detectable in upper and lower  respiratory specimens dur ing the acute phase of infection.  Positive  results are indicative of active infection with SARS-CoV-2.  Clinical  correlation with patient history and other diagnostic information is  necessary to determine  patient infection status.  Positive results do  not rule out bacterial infection or co-infection with other viruses. If result is PRESUMPTIVE POSTIVE SARS-CoV-2 nucleic acids MAY BE PRESENT.   A presumptive positive result was obtained on the submitted specimen  and confirmed on repeat testing.  While 2019 novel coronavirus  (SARS-CoV-2) nucleic acids may be present in the submitted sample  additional confirmatory testing may be necessary for epidemiological  and / or clinical management purposes  to differentiate between  SARS-CoV-2 and other Sarbecovirus currently known to infect humans.  If clinically indicated additional testing with an alternate test  methodology [phone]) is advised. The SARS-CoV-2 RNA is generally  detectable in upper and lower respiratory sp ecimens during the acute  phase of infection. The expected result is Negative. Fact Sheet for Patients:  StrictlyIdeas.no Fact Sheet for Healthcare  Providers: BankingDealers.co.za This test is not yet approved or cleared by the [place] FDA and has been authorized for detection and/or diagnosis of SARS-CoV-2 by FDA under an Emergency Use Authorization (EUA).  This EUA will remain in effect (meaning this test can be used) for the duration of the COVID-19 declaration under Section 564(b)(1) of the Act, 21 U.S.C. section 360bbb-3(b)(1), unless the authorization is terminated or revoked sooner. Performed at [facility], [place] [address] [place], [place] [address]      Time coordinating discharge: 35 minutes  SIGNED:   Aline August, MD  [facility] 02/11/2019, 4:00 PM

## 2019-02-11 NOTE — Telephone Encounter (Signed)
Received vm message from patient's daughter, [name]. She states her father is still in the hospital and that pt has appts here on Monday for labs, Dr. Julien Nordmann and chemo treatment. Advised that these will likely be cancelled. Asked her to let us know when he is being discharged.  She states he will be going to a skilled facility.

## 2019-02-11 NOTE — Progress Notes (Signed)
Pt resting in bed. Says "I feel good". Pt's daughter called, pt spoke to her a bit. Updated both on plan of care.  Pt refusing bath today. Educated pt, pt continued to refuse.

## 2019-02-11 NOTE — Progress Notes (Signed)
Followed [place] MEWS guidelines throughout the night.  Last set of VS pt on green and VS can go back to ordered.  Will pass this on to day nurse.  Pt. Still on cardizen drip so monitor is set to every 30 mins.

## 2019-02-11 NOTE — Progress Notes (Addendum)
Gave report to [facility] given report. IV and external catheter removed. PT in stable condition.

## 2019-02-11 NOTE — Progress Notes (Addendum)
Stopped cardizem drip per MD Alekh. Pt's HR 65 at this time. Will continue frequent VS. Telemetry alerted - will let me know about HR less than 60 and greater than 120. Pt resting in bed, eager to go home, not hungry at this time but says will eat later.  BP 116/67 (BP Location: Right Arm)   Pulse 71   Temp 97.7 F (36.5 C) (Oral)   Resp (!) 25   Ht 6\' 1"  (1.854 m)   Wt 68.1 kg   SpO2 94%   BMI 19.81 kg/m

## 2019-02-11 NOTE — TOC Progression Note (Addendum)
Transition of Care [facility]) - Progression Note    Patient Details  Name: Richard [name] MRN: [id number] Date of Birth: Jul 26, 1940  Transition of Care [facility]) CM/SW [place], LCSW Phone Number: 02/11/2019, 3:11 PM  Clinical Narrative:    3:44pm- SNF has insurance approval for patient to discharge today. MD preparing discharge.  3pm-[place] still waiting on appeal decision. Patient's daughter aware.   Expected Discharge Plan: [place] Barriers to Discharge: Ship broker, Continued Medical Work up  Expected Discharge Plan and Services Expected Discharge Plan: [place] In-house Referral: Clinical Social Work Discharge Planning Services: NA Post Acute Care Choice: [place] Living arrangements for the past 2 months: Apartment Expected Discharge Date: 02/08/19               DME Arranged: N/A DME Agency: NA       HH Arranged: NA HH Agency: NA         Social Determinants of Health (SDOH) Interventions    Readmission Risk Interventions No flowsheet data found.

## 2019-02-11 NOTE — Progress Notes (Signed)
Pt resting in bed, discussed paln of care, especially importance of amiodarone. Pt took pills this morning, but is asking for a cigarette. Repeat BP 116/37 (MAP 61) - pt moving arm, though. HR 62-65 though had brief moments of going below 60.

## 2019-02-14 ENCOUNTER — Inpatient Hospital Stay: Payer: Medicare HMO

## 2019-02-14 ENCOUNTER — Telehealth: Payer: Self-pay | Admitting: Internal Medicine

## 2019-02-14 ENCOUNTER — Telehealth: Payer: Self-pay

## 2019-02-14 ENCOUNTER — Inpatient Hospital Stay: Payer: Medicare HMO | Admitting: Internal Medicine

## 2019-02-14 NOTE — Telephone Encounter (Signed)
Spoke to pt's daughter and let her know that per Dr. Julien Nordmann, pt to reschedule appointments to next week. Pt currently at [facility] [phone]). Phone number for SNF facility obtained and scheduling message sent.

## 2019-02-14 NOTE — Telephone Encounter (Signed)
Appointments for today already cancelled as per 8/31 schedule message. Confirmed with desk nurse patient already squared away to resume next week and nursing home has been contaced.

## 2019-02-15 ENCOUNTER — Telehealth: Payer: Self-pay | Admitting: Primary Care

## 2019-02-15 NOTE — Telephone Encounter (Signed)
New Message   Rip Harbour a home health nurse is calling states she faxed some orders over for a 1 week 1 begin date 7/21. She states Dr. Margarita Rana would need to sign for [place]. Please f/u

## 2019-02-16 ENCOUNTER — Telehealth: Payer: Self-pay | Admitting: Internal Medicine

## 2019-02-16 DIAGNOSIS — C3490 Malignant neoplasm of unspecified part of unspecified bronchus or lung: Secondary | ICD-10-CM | POA: Diagnosis not present

## 2019-02-16 DIAGNOSIS — J189 Pneumonia, unspecified organism: Secondary | ICD-10-CM | POA: Diagnosis not present

## 2019-02-16 DIAGNOSIS — I2699 Other pulmonary embolism without acute cor pulmonale: Secondary | ICD-10-CM | POA: Diagnosis not present

## 2019-02-16 DIAGNOSIS — I480 Paroxysmal atrial fibrillation: Secondary | ICD-10-CM | POA: Diagnosis not present

## 2019-02-16 NOTE — Telephone Encounter (Signed)
Per 8/31  sch message - called Wandra Feinstein to make sure pt had transportation set up for his next appt. Spoke with  [facility] Dates - they are aware of appt date and time .

## 2019-02-17 DIAGNOSIS — I480 Paroxysmal atrial fibrillation: Secondary | ICD-10-CM | POA: Diagnosis not present

## 2019-02-17 DIAGNOSIS — I2699 Other pulmonary embolism without acute cor pulmonale: Secondary | ICD-10-CM | POA: Diagnosis not present

## 2019-02-17 DIAGNOSIS — R339 Retention of urine, unspecified: Secondary | ICD-10-CM | POA: Diagnosis not present

## 2019-02-17 DIAGNOSIS — C3490 Malignant neoplasm of unspecified part of unspecified bronchus or lung: Secondary | ICD-10-CM | POA: Diagnosis not present

## 2019-02-22 ENCOUNTER — Inpatient Hospital Stay: Payer: Medicare HMO | Attending: Internal Medicine

## 2019-02-22 ENCOUNTER — Inpatient Hospital Stay: Payer: Medicare HMO

## 2019-02-22 ENCOUNTER — Other Ambulatory Visit: Payer: Self-pay

## 2019-02-22 VITALS — BP 97/60 | HR 98 | Temp 98.7°F | Resp 18 | Ht 73.0 in | Wt 148.0 lb

## 2019-02-22 DIAGNOSIS — C342 Malignant neoplasm of middle lobe, bronchus or lung: Secondary | ICD-10-CM | POA: Insufficient documentation

## 2019-02-22 DIAGNOSIS — Z7901 Long term (current) use of anticoagulants: Secondary | ICD-10-CM | POA: Insufficient documentation

## 2019-02-22 DIAGNOSIS — F0390 Unspecified dementia without behavioral disturbance: Secondary | ICD-10-CM | POA: Diagnosis not present

## 2019-02-22 DIAGNOSIS — Z5111 Encounter for antineoplastic chemotherapy: Secondary | ICD-10-CM | POA: Diagnosis not present

## 2019-02-22 DIAGNOSIS — C3491 Malignant neoplasm of unspecified part of right bronchus or lung: Secondary | ICD-10-CM

## 2019-02-22 DIAGNOSIS — E7800 Pure hypercholesterolemia, unspecified: Secondary | ICD-10-CM | POA: Insufficient documentation

## 2019-02-22 DIAGNOSIS — C3490 Malignant neoplasm of unspecified part of unspecified bronchus or lung: Secondary | ICD-10-CM | POA: Diagnosis not present

## 2019-02-22 DIAGNOSIS — I2699 Other pulmonary embolism without acute cor pulmonale: Secondary | ICD-10-CM | POA: Diagnosis not present

## 2019-02-22 DIAGNOSIS — I10 Essential (primary) hypertension: Secondary | ICD-10-CM | POA: Insufficient documentation

## 2019-02-22 DIAGNOSIS — Z79899 Other long term (current) drug therapy: Secondary | ICD-10-CM | POA: Diagnosis not present

## 2019-02-22 DIAGNOSIS — Z86711 Personal history of pulmonary embolism: Secondary | ICD-10-CM | POA: Diagnosis not present

## 2019-02-22 DIAGNOSIS — R69 Illness, unspecified: Secondary | ICD-10-CM | POA: Diagnosis not present

## 2019-02-22 DIAGNOSIS — R339 Retention of urine, unspecified: Secondary | ICD-10-CM | POA: Diagnosis not present

## 2019-02-22 DIAGNOSIS — I480 Paroxysmal atrial fibrillation: Secondary | ICD-10-CM | POA: Diagnosis not present

## 2019-02-22 LAB — CBC WITH DIFFERENTIAL (CANCER CENTER ONLY)
Abs Immature Granulocytes: 0.02 10*3/uL (ref 0.00–0.07)
Basophils Absolute: 0 10*3/uL (ref 0.0–0.1)
Basophils Relative: 0 %
Eosinophils Absolute: 0.1 10*3/uL (ref 0.0–0.5)
Eosinophils Relative: 1 %
HCT: 35.5 % — ABNORMAL LOW (ref 39.0–52.0)
Hemoglobin: 11.2 g/dL — ABNORMAL LOW (ref 13.0–17.0)
Immature Granulocytes: 0 %
Lymphocytes Relative: 34 %
Lymphs Abs: 3.5 10*3/uL (ref 0.7–4.0)
MCH: 20.1 pg — ABNORMAL LOW (ref 26.0–34.0)
MCHC: 31.5 g/dL (ref 30.0–36.0)
MCV: 63.8 fL — ABNORMAL LOW (ref 80.0–100.0)
Monocytes Absolute: 1 10*3/uL (ref 0.1–1.0)
Monocytes Relative: 10 %
Neutro Abs: 5.5 10*3/uL (ref 1.7–7.7)
Neutrophils Relative %: 55 %
Platelet Count: 480 10*3/uL — ABNORMAL HIGH (ref 150–400)
RBC: 5.56 MIL/uL (ref 4.22–5.81)
RDW: 21.2 % — ABNORMAL HIGH (ref 11.5–15.5)
WBC Count: 10.1 10*3/uL (ref 4.0–10.5)
nRBC: 0 % (ref 0.0–0.2)

## 2019-02-22 LAB — CMP (CANCER CENTER ONLY)
ALT: 11 U/L (ref 0–44)
AST: 14 U/L — ABNORMAL LOW (ref 15–41)
Albumin: 2.6 g/dL — ABNORMAL LOW (ref 3.5–5.0)
Alkaline Phosphatase: 119 U/L (ref 38–126)
Anion gap: 9 (ref 5–15)
BUN: 6 mg/dL — ABNORMAL LOW (ref 8–23)
CO2: 24 mmol/L (ref 22–32)
Calcium: 9.8 mg/dL (ref 8.9–10.3)
Chloride: 104 mmol/L (ref 98–111)
Creatinine: 0.79 mg/dL (ref 0.61–1.24)
GFR, Est AFR Am: >60 mL/min (ref >60)
GFR, Estimated: >60 mL/min (ref >60)
Glucose, Bld: 123 mg/dL — ABNORMAL HIGH (ref 70–99)
Potassium: 4.1 mmol/L (ref 3.5–5.1)
Sodium: 137 mmol/L (ref 135–145)
Total Bilirubin: 0.4 mg/dL (ref 0.3–1.2)
Total Protein: 7.7 g/dL (ref 6.5–8.1)

## 2019-02-22 MED ORDER — SODIUM CHLORIDE 0.9 % IV SOLN
45.0000 mg/m2 | Freq: Once | INTRAVENOUS | Status: AC
  Administered 2019-02-22: 84 mg via INTRAVENOUS
  Filled 2019-02-22: qty 14

## 2019-02-22 MED ORDER — SODIUM CHLORIDE 0.9 % IV SOLN
20.0000 mg | Freq: Once | INTRAVENOUS | Status: AC
  Administered 2019-02-22: 20 mg via INTRAVENOUS
  Filled 2019-02-22: qty 20

## 2019-02-22 MED ORDER — SODIUM CHLORIDE 0.9 % IV SOLN
172.8000 mg | Freq: Once | INTRAVENOUS | Status: AC
  Administered 2019-02-22: 170 mg via INTRAVENOUS
  Filled 2019-02-22: qty 17

## 2019-02-22 MED ORDER — FAMOTIDINE IN NACL 20-0.9 MG/50ML-% IV SOLN
INTRAVENOUS | Status: AC
  Filled 2019-02-22: qty 50

## 2019-02-22 MED ORDER — SODIUM CHLORIDE 0.9% FLUSH
10.0000 mL | INTRAVENOUS | Status: DC | PRN
  Filled 2019-02-22: qty 10

## 2019-02-22 MED ORDER — SODIUM CHLORIDE 0.9 % IV SOLN
Freq: Once | INTRAVENOUS | Status: AC
  Administered 2019-02-22: 15:00:00 via INTRAVENOUS
  Filled 2019-02-22: qty 250

## 2019-02-22 MED ORDER — FAMOTIDINE IN NACL 20-0.9 MG/50ML-% IV SOLN
20.0000 mg | Freq: Once | INTRAVENOUS | Status: AC
  Administered 2019-02-22: 20 mg via INTRAVENOUS

## 2019-02-22 MED ORDER — HEPARIN SOD (PORK) LOCK FLUSH 100 UNIT/ML IV SOLN
500.0000 [IU] | Freq: Once | INTRAVENOUS | Status: DC | PRN
  Filled 2019-02-22: qty 5

## 2019-02-22 MED ORDER — PALONOSETRON HCL INJECTION 0.25 MG/5ML
INTRAVENOUS | Status: AC
  Filled 2019-02-22: qty 5

## 2019-02-22 MED ORDER — DIPHENHYDRAMINE HCL 50 MG/ML IJ SOLN
50.0000 mg | Freq: Once | INTRAMUSCULAR | Status: AC
  Administered 2019-02-22: 50 mg via INTRAVENOUS

## 2019-02-22 MED ORDER — DIPHENHYDRAMINE HCL 50 MG/ML IJ SOLN
INTRAMUSCULAR | Status: AC
  Filled 2019-02-22: qty 1

## 2019-02-22 MED ORDER — PALONOSETRON HCL INJECTION 0.25 MG/5ML
0.2500 mg | Freq: Once | INTRAVENOUS | Status: AC
  Administered 2019-02-22: 0.25 mg via INTRAVENOUS

## 2019-02-22 NOTE — Progress Notes (Signed)
MD reviewed pt labs -cbc and cmet  results, ok to proceed with treatment.   Pt does not need to see provider today prior to treatment.   MD aware pt was recently d/c from hospital, message to scheduling for pt to be seen by provider next week with labs.

## 2019-02-22 NOTE — Patient Instructions (Signed)
[place] Discharge Instructions for Patients Receiving Chemotherapy  Today you received the following chemotherapy agents Taxol, Carboplatin  To help prevent nausea and vomiting after your treatment, we encourage you to take your nausea medication as directed by your MD.   If you develop nausea and vomiting that is not controlled by your nausea medication, call the clinic.   BELOW ARE SYMPTOMS THAT SHOULD BE REPORTED IMMEDIATELY:  *FEVER GREATER THAN 100.5 F  *CHILLS WITH OR WITHOUT FEVER  NAUSEA AND VOMITING THAT IS NOT CONTROLLED WITH YOUR NAUSEA MEDICATION  *UNUSUAL SHORTNESS OF BREATH  *UNUSUAL BRUISING OR BLEEDING  TENDERNESS IN MOUTH AND THROAT WITH OR WITHOUT PRESENCE OF ULCERS  *URINARY PROBLEMS  *BOWEL PROBLEMS  UNUSUAL RASH Items with * indicate a potential emergency and should be followed up as soon as possible.  Feel free to call the clinic should you have any questions or concerns. The clinic phone number is [phone].  Please show the [place] at check-in to the Emergency Department and triage nurse. Coronavirus (COVID-19) Are you at risk?  Are you at risk for the Coronavirus (COVID-19)?  To be considered HIGH RISK for Coronavirus (COVID-19), you have to meet the following criteria:  . Traveled to [place], [place], [place], [place] or [place]; or in the [place] to [place], [place], [place], or [place]; and have fever, cough, and shortness of breath within the last 2 weeks of travel OR . Been in close contact with a person diagnosed with COVID-19 within the last 2 weeks and have fever, cough, and shortness of breath . IF YOU DO NOT MEET THESE CRITERIA, YOU ARE CONSIDERED LOW RISK FOR COVID-19.  What to do if you are HIGH RISK for COVID-19?  Marland Kitchen If you are having a medical emergency, call 911. . Seek medical care right away. Before you go to a doctor's office, urgent care or emergency department, call ahead and tell  them about your recent travel, contact with someone diagnosed with COVID-19, and your symptoms. You should receive instructions from your physician's office regarding next steps of care.  . When you arrive at healthcare provider, tell the healthcare staff immediately you have returned from visiting [place], [place], [place], [place] or [place]; or traveled in the [place] to [place], [place], [place], or [place]; in the last two weeks or you have been in close contact with a person diagnosed with COVID-19 in the last 2 weeks.   . Tell the health care staff about your symptoms: fever, cough and shortness of breath. . After you have been seen by a medical provider, you will be either: o Tested for (COVID-19) and discharged home on quarantine except to seek medical care if symptoms worsen, and asked to  - Stay home and avoid contact with others until you get your results (4-5 days)  - Avoid travel on public transportation if possible (such as bus, train, or airplane) or o Sent to the Emergency Department by EMS for evaluation, COVID-19 testing, and possible admission depending on your condition and test results.  What to do if you are LOW RISK for COVID-19?  Reduce your risk of any infection by using the same precautions used for avoiding the common cold or flu:  Marland Kitchen Wash your hands often with soap and warm water for at least 20 seconds.  If soap and water are not readily available, use an alcohol-based hand sanitizer with at least 60% alcohol.  . If  coughing or sneezing, cover your mouth and nose by coughing or sneezing into the elbow areas of your shirt or coat, into a tissue or into your sleeve (not your hands). . Avoid shaking hands with others and consider head nods or verbal greetings only. . Avoid touching your eyes, nose, or mouth with unwashed hands.  . Avoid close contact with people who are sick. . Avoid places or events with large numbers of people in one location, like concerts or  sporting events. . Carefully consider travel plans you have or are making. . If you are planning any travel outside or inside the [place], visit the CDC's Travelers' Health webpage for the latest health notices. . If you have some symptoms but not all symptoms, continue to monitor at home and seek medical attention if your symptoms worsen. . If you are having a medical emergency, call 911.   [place] / e-Visit: [email or web address]         [facility]: [facility]: [phone]                   [facility]: [phone]

## 2019-02-28 ENCOUNTER — Telehealth: Payer: Self-pay | Admitting: Internal Medicine

## 2019-02-28 ENCOUNTER — Inpatient Hospital Stay: Payer: Medicare HMO | Admitting: Internal Medicine

## 2019-02-28 ENCOUNTER — Inpatient Hospital Stay: Payer: Medicare HMO

## 2019-02-28 ENCOUNTER — Telehealth: Payer: Self-pay

## 2019-02-28 NOTE — Telephone Encounter (Signed)
TC to [place] to inquire about  Pt's missed  appointment with Dr. Julien Nordmann Nurse unavailable to receive message Collet[place]) operator took message and stated Nurse will return call about Pt's missed  Appointment.

## 2019-02-28 NOTE — Telephone Encounter (Signed)
Returned patient's phone call regarding rescheduling 09/14 missed appointments, appointments have been moved to 09/15 per request.

## 2019-03-01 ENCOUNTER — Ambulatory Visit: Payer: Medicare HMO

## 2019-03-01 ENCOUNTER — Inpatient Hospital Stay: Payer: Medicare HMO

## 2019-03-01 ENCOUNTER — Other Ambulatory Visit: Payer: Self-pay

## 2019-03-01 ENCOUNTER — Encounter: Payer: Self-pay | Admitting: *Deleted

## 2019-03-01 ENCOUNTER — Telehealth: Payer: Self-pay | Admitting: Internal Medicine

## 2019-03-01 ENCOUNTER — Encounter: Payer: Self-pay | Admitting: Internal Medicine

## 2019-03-01 ENCOUNTER — Inpatient Hospital Stay (HOSPITAL_BASED_OUTPATIENT_CLINIC_OR_DEPARTMENT_OTHER): Payer: Medicare HMO | Admitting: Internal Medicine

## 2019-03-01 VITALS — BP 119/54 | HR 87 | Temp 97.8°F | Resp 17 | Ht 73.0 in | Wt 143.2 lb

## 2019-03-01 DIAGNOSIS — I2699 Other pulmonary embolism without acute cor pulmonale: Secondary | ICD-10-CM | POA: Diagnosis not present

## 2019-03-01 DIAGNOSIS — E7800 Pure hypercholesterolemia, unspecified: Secondary | ICD-10-CM | POA: Diagnosis not present

## 2019-03-01 DIAGNOSIS — Z5111 Encounter for antineoplastic chemotherapy: Secondary | ICD-10-CM

## 2019-03-01 DIAGNOSIS — Z86711 Personal history of pulmonary embolism: Secondary | ICD-10-CM | POA: Diagnosis not present

## 2019-03-01 DIAGNOSIS — Z7901 Long term (current) use of anticoagulants: Secondary | ICD-10-CM | POA: Diagnosis not present

## 2019-03-01 DIAGNOSIS — R69 Illness, unspecified: Secondary | ICD-10-CM | POA: Diagnosis not present

## 2019-03-01 DIAGNOSIS — C3491 Malignant neoplasm of unspecified part of right bronchus or lung: Secondary | ICD-10-CM

## 2019-03-01 DIAGNOSIS — C3490 Malignant neoplasm of unspecified part of unspecified bronchus or lung: Secondary | ICD-10-CM | POA: Diagnosis not present

## 2019-03-01 DIAGNOSIS — E440 Moderate protein-calorie malnutrition: Secondary | ICD-10-CM | POA: Diagnosis not present

## 2019-03-01 DIAGNOSIS — C342 Malignant neoplasm of middle lobe, bronchus or lung: Secondary | ICD-10-CM | POA: Diagnosis not present

## 2019-03-01 DIAGNOSIS — R5381 Other malaise: Secondary | ICD-10-CM | POA: Diagnosis not present

## 2019-03-01 DIAGNOSIS — Z79899 Other long term (current) drug therapy: Secondary | ICD-10-CM | POA: Diagnosis not present

## 2019-03-01 DIAGNOSIS — I10 Essential (primary) hypertension: Secondary | ICD-10-CM | POA: Diagnosis not present

## 2019-03-01 LAB — CMP (CANCER CENTER ONLY)
ALT: 14 U/L (ref 0–44)
AST: 16 U/L (ref 15–41)
Albumin: 2.8 g/dL — ABNORMAL LOW (ref 3.5–5.0)
Alkaline Phosphatase: 96 U/L (ref 38–126)
Anion gap: 8 (ref 5–15)
BUN: 10 mg/dL (ref 8–23)
CO2: 26 mmol/L (ref 22–32)
Calcium: 10.1 mg/dL (ref 8.9–10.3)
Chloride: 101 mmol/L (ref 98–111)
Creatinine: 0.87 mg/dL (ref 0.61–1.24)
GFR, Est AFR Am: >60 mL/min (ref >60)
GFR, Estimated: >60 mL/min (ref >60)
Glucose, Bld: 134 mg/dL — ABNORMAL HIGH (ref 70–99)
Potassium: 4.3 mmol/L (ref 3.5–5.1)
Sodium: 135 mmol/L (ref 135–145)
Total Bilirubin: 0.5 mg/dL (ref 0.3–1.2)
Total Protein: 7.3 g/dL (ref 6.5–8.1)

## 2019-03-01 LAB — CBC WITH DIFFERENTIAL (CANCER CENTER ONLY)
Abs Immature Granulocytes: 0.06 10*3/uL (ref 0.00–0.07)
Basophils Absolute: 0 10*3/uL (ref 0.0–0.1)
Basophils Relative: 0 %
Eosinophils Absolute: 0 10*3/uL (ref 0.0–0.5)
Eosinophils Relative: 0 %
HCT: 31.8 % — ABNORMAL LOW (ref 39.0–52.0)
Hemoglobin: 10.3 g/dL — ABNORMAL LOW (ref 13.0–17.0)
Immature Granulocytes: 1 %
Lymphocytes Relative: 39 %
Lymphs Abs: 3.8 10*3/uL (ref 0.7–4.0)
MCH: 20.4 pg — ABNORMAL LOW (ref 26.0–34.0)
MCHC: 32.4 g/dL (ref 30.0–36.0)
MCV: 62.8 fL — ABNORMAL LOW (ref 80.0–100.0)
Monocytes Absolute: 0.5 10*3/uL (ref 0.1–1.0)
Monocytes Relative: 6 %
Neutro Abs: 5.2 10*3/uL (ref 1.7–7.7)
Neutrophils Relative %: 54 %
Platelet Count: 509 10*3/uL — ABNORMAL HIGH (ref 150–400)
RBC: 5.06 MIL/uL (ref 4.22–5.81)
RDW: 21.1 % — ABNORMAL HIGH (ref 11.5–15.5)
WBC Count: 9.6 10*3/uL (ref 4.0–10.5)
nRBC: 0.3 % — ABNORMAL HIGH (ref 0.0–0.2)

## 2019-03-01 NOTE — Progress Notes (Signed)
[place] [phone]   Fax:([phone]  OFFICE PROGRESS NOTE  [name], NP [id number] [id number]  DIAGNOSIS: At least stage IIIA(T3, N2, M0) non-small cell lung cancer, squamous cell carcinoma presented with large right middle lobe lung mass with postobstructive pneumonia as well as right hilar and mediastinal lymphadenopathy diagnosed in July 2020. He also has a suspicious small right pleural effusion and small indeterminate metabolic focus in the liver.   PRIOR THERAPY: None  CURRENT THERAPY: Concurrent chemoradiation with Carboplatin for an AUC of 2 and Paclitaxel 45 mg/m2. First dose expected on 01/24/2019.  Status post 2 cycles.  INTERVAL HISTORY: Richard [name] 78 y.o. male returns to the clinic today for follow-up visit accompanied by his daughter.  His brother Eddie Dibbles was available by phone during the visit.  The patient continues to complain of increasing fatigue and weakness.  He was recently admitted to the hospital with pneumonia as well as pulmonary embolism.  He is currently at a skilled nursing facility.  He denied having any current chest pain but has shortness of breath with exertion with mild cough and no hemoptysis.  He denied having any fever or chills.  He has no nausea, vomiting, diarrhea or constipation.  He did not receive much radiotherapy because of his recent admission.  His chemotherapy was also on hold for the last few weeks.  He is here today for evaluation and discussion of his treatment options.  MEDICAL HISTORY: Past Medical History:  Diagnosis Date   Dementia (Richard [name])    High cholesterol    Hypertension    Lung mass 12/2018    ALLERGIES:  has No Known Allergies.  MEDICATIONS:  Current Outpatient Medications  Medication Sig Dispense Refill   amiodarone (PACERONE) 200 MG tablet Take 1 tablet (200 mg total) by mouth 2 (two) times daily. 60 tablet 0   atorvastatin (LIPITOR) 20 MG tablet  Take 1 tablet (20 mg total) by mouth every evening. 90 tablet 1   diltiazem (CARDIZEM CD) 120 MG 24 hr capsule Take 1 capsule (120 mg total) by mouth daily. 30 capsule 0   midodrine (PROAMATINE) 5 MG tablet Take 1 tablet (5 mg total) by mouth 2 (two) times daily with a meal. 30 tablet 0   Omega-3 Fatty Acids (FISH OIL PO) Take 1 capsule by mouth daily.     prochlorperazine (COMPAZINE) 10 MG tablet Take 1 tablet (10 mg total) by mouth every 6 (six) hours as needed for nausea or vomiting. 30 tablet 0   Rivaroxaban 15 & 20 MG TBPK Take as directed on package: Start with one 15mg  tablet by mouth twice a day with food. On Day 22, switch to one 20mg  tablet once a day with food. 51 each 0   tamsulosin (FLOMAX) 0.4 MG CAPS capsule Take 1 capsule (0.4 mg total) by mouth at bedtime. 30 capsule 0   No current facility-administered medications for this visit.     SURGICAL HISTORY:  Past Surgical History:  Procedure Laterality Date   JOINT REPLACEMENT     VIDEO BRONCHOSCOPY WITH ENDOBRONCHIAL ULTRASOUND N/A 12/16/2018   Procedure: VIDEO BRONCHOSCOPY WITH ENDOBRONCHIAL ULTRASOUND AND FLUROSCOPY;  Surgeon: [name], MD;  Location: [facility];  Service: Pulmonary;  Laterality: N/A;    REVIEW OF SYSTEMS:  Constitutional: positive for fatigue and weight loss Eyes: negative Ears, nose, mouth, throat, and face: negative Respiratory: positive for dyspnea on exertion Cardiovascular: negative Gastrointestinal: negative Genitourinary:negative Integument/breast: negative Hematologic/lymphatic:  negative Musculoskeletal:positive for muscle weakness Neurological: negative Behavioral/Psych: negative Endocrine: negative Allergic/Immunologic: negative   PHYSICAL EXAMINATION: General appearance: alert, cooperative, fatigued and no distress Head: Normocephalic, without obvious abnormality, atraumatic Neck: no adenopathy, no JVD, supple, symmetrical, trachea midline and thyroid not enlarged, symmetric, no  tenderness/mass/nodules Lymph nodes: Cervical, supraclavicular, and axillary nodes normal. Resp: clear to auscultation bilaterally Back: symmetric, no curvature. ROM normal. No CVA tenderness. Cardio: regular rate and rhythm, S1, S2 normal, no murmur, click, rub or gallop GI: soft, non-tender; bowel sounds normal; no masses,  no organomegaly Extremities: extremities normal, atraumatic, no cyanosis or edema Neurologic: Alert and oriented X 3, normal strength and tone. Normal symmetric reflexes. Normal coordination and gait  ECOG PERFORMANCE STATUS: 1 - Symptomatic but completely ambulatory  Blood pressure (!) 119/54, pulse 87, temperature 97.8 F (36.6 C), resp. rate 17, height 6\' 1"  (1.854 m), weight 143 lb 3.2 oz (65 kg), SpO2 99 %.  LABORATORY DATA: Lab Results  Component Value Date   WBC 9.6 03/01/2019   HGB 10.3 (L) 03/01/2019   HCT 31.8 (L) 03/01/2019   MCV 62.8 (L) 03/01/2019   PLT 509 (H) 03/01/2019      Chemistry      Component Value Date/Time   NA 137 02/22/2019 1308   NA 145 (H) 10/29/2016 1513   K 4.1 02/22/2019 1308   CL 104 02/22/2019 1308   CO2 24 02/22/2019 1308   BUN 6 (L) 02/22/2019 1308   BUN 12 10/29/2016 1513   CREATININE 0.79 02/22/2019 1308      Component Value Date/Time   CALCIUM 9.8 02/22/2019 1308   ALKPHOS 119 02/22/2019 1308   AST 14 (L) 02/22/2019 1308   ALT 11 02/22/2019 1308   BILITOT 0.4 02/22/2019 1308       RADIOGRAPHIC STUDIES: Dg Chest 1 View  Result Date: 02/01/2019 CLINICAL DATA:  Shortness of breath, altered mental status EXAM: CHEST  1 VIEW COMPARISON:  01/30/2019 FINDINGS: Consolidation in the right lower lung is stable. Underlying COPD. Left lung clear. Heart is normal size. IMPRESSION: Stable right basilar atelectasis or scar site stable right basilar infiltrate/consolidation. Electronically Signed   By: Rolm Baptise M.D.   On: 02/01/2019 08:33   Ct Chest W Contrast  Result Date: 02/05/2019 CLINICAL DATA:  Lung cancer,  assess treatment response EXAM: CT CHEST WITH CONTRAST TECHNIQUE: Multidetector CT imaging of the chest was performed during intravenous contrast administration. CONTRAST:  80mL OMNIPAQUE IOHEXOL 300 MG/ML  SOLN COMPARISON:  CT chest, 01/30/2019, PET-CT, 01/04/2019, CT chest, 12/14/2018 FINDINGS: Cardiovascular: Aortic atherosclerosis. Normal heart size. No pericardial effusion. There is bilateral pulmonary embolus as seen on prior examination dated 01/30/2019, generally in the segmental to subsegmental vessels. Although this examination is not tailored to evaluate the pulmonary arteries, the burden of embolus appears to be somewhat decreased when compared to prior examination, particularly in the left lower lobe pulmonary artery (series 3, image 90). Mediastinum/Nodes: No significant change in prominent pretracheal lymph nodes measuring up to 1.0 cm in short axis (series 3, image 75). Thyroid gland, trachea, and esophagus demonstrate no significant findings. Lungs/Pleura: There is a spiculated perihilar mass of the right lung with obstruction of the right middle lobe bronchi and total atelectasis/postobstructive consolidation of the right middle lobe. The dominant mass measures at least 6.7 x 5.5 x 5.6 cm (series 3, image 93, series 6, image 58), previously 5.4 x 5.6 x 3.8 cm when measured similarly on initial CT examination dated 12/14/2018. Moderate centrilobular emphysema. Moderate right pleural  effusion, substantially enlarged when compared to prior examination. There is irregular subpleural airspace disease of the dependent left lower lobe distal to pulmonary embolus (series 3, image 113). No change in a 5 mm nonspecific pulmonary nodule of the left lower lobe (series 4, image 114). Upper Abdomen: No acute abnormality. Musculoskeletal: No chest wall mass or suspicious bone lesions identified. IMPRESSION: 1. There is a spiculated perihilar mass of the right lung with obstruction of the right middle lobe bronchi  and total atelectasis/postobstructive consolidation of the right middle lobe. The dominant mass measures at least 6.7 x 5.5 x 5.6 cm (series 3, image 93, series 6, image 58), previously 5.4 x 5.6 x 3.8 cm when measured similarly on initial CT examination dated 12/14/2018. 2. No significant change in prominent pretracheal lymph nodes measuring up to 1.0 cm in short axis (series 3, image 75). 3. Unchanged 5 mm nonspecific pulmonary nodule of the left lower lobe (series 4, image 114). Attention on follow-up. 4. There is bilateral pulmonary embolus as seen on prior examination dated 01/30/2019, generally in the segmental to subsegmental vessels. Although this examination is not tailored to evaluate the pulmonary arteries, the burden of embolus appears to be somewhat decreased when compared to prior examination, particularly in the left lower lobe pulmonary artery (series 3, image 90). 5. There is irregular subpleural airspace disease of the dependent left lower lobe distal to pulmonary embolus (series 3, image 113), concerning for pulmonary infarction. 6. Moderate right pleural effusion, substantially enlarged when compared to prior examination. 7.  Emphysema. 8.  Aortic atherosclerosis and coronary artery disease. Electronically Signed   By: Eddie Candle M.D.   On: 02/05/2019 17:22   Ct Angio Chest Pe W And/or Wo Contrast  Result Date: 01/30/2019 CLINICAL DATA:  Initial lung cancer. Concern for pulmonary embolism or ischemia bowel. EXAM: CT ANGIOGRAPHY CHEST, ABDOMEN AND PELVIS TECHNIQUE: Multidetector CT imaging through the chest, abdomen and pelvis was performed using the standard protocol during bolus administration of intravenous contrast. Multiplanar reconstructed images and MIPs were obtained and reviewed to evaluate the vascular anatomy. CONTRAST:  199mL OMNIPAQUE IOHEXOL 350 MG/ML SOLN COMPARISON:  CT 12/14/2018 FINDINGS: CTA CHEST FINDINGS Cardiovascular: Filling defect within the RIGHT lower lobe  pulmonary artery (image 102/5) Filling defect within the LEFT lobe pulmonary arteries (image 97/5). Filling defect within the LEFT upper lobe pulmonary artery. Overall clot burden is moderate. The RIGHT ventricle ratio to LEFT ventricle ratio is greater than 1 (1.36). Of note this ratio is similar to CT of 12/14/2019 when there was no pulmonary emboli. Mediastinum/Nodes: No axillary supraclavicular adenopathy. Perihilar thickening in the RIGHT hilum similar prior. Lungs/Pleura: There is collapse of the RIGHT middle lobe. Mass in the RIGHT upper lobe seen on comparison exam which not well appreciated but measures potentially 4.7 cm on image 87/5 increased from 3.8 cm on prior. Musculoskeletal: No aggressive osseous lesion. Review of the MIP images confirms the above findings. CTA ABDOMEN AND PELVIS FINDINGS VASCULAR Aorta: No dissection or aneurysm. Celiac: Widely patent. SMA: Widely patent. Renals: Single renal artery on the LEFT and 2 renal arteries on the RIGHT all widely patent. IMA: Widely patent Inflow: Normal interval calcifications of the iliac arteries. Veins: No acute finding Review of the MIP images confirms the above findings. NON-VASCULAR Hepatobiliary: No focal hepatic lesion. Pancreas: No pancreatic inflammation. Spleen: Normal. Adrenals/Urinary Tract: adrenal glands and kidneys normal. Bladder is distended to level the umbilicus. Stomach/Bowel: Small hiatal hernia. The stomach duodenum normal. No evidence of bowel ischemia no  bowel dilatation appendix normal. The colon and rectosigmoid colon are normal. Lymphatic: No abdominopelvic lymphadenopathy Reproductive: Prostate nodular dense the base the bladder. Other: Evaluation of the pelvic floor is limited by the LEFT hip prosthetic Musculoskeletal: No aggressive osseous lesion Review of the MIP images confirms the above findings. IMPRESSION: Chest Impression: 1. Acute pulmonary embolism in the LEFT and RIGHT lower lobe pulmonary arteries and LEFT upper  lobe. Overall clot burden is moderate. 2. Positive for acute PE with CT evidence of right heart strain (RV/LV Ratio = 1.36). This ratio suggests RIGHT heart strain; however, the ratio of RIGHT ventricle to LEFT ventricle diameter is similar to CT of 12/14/2018 at which time there was no PE. 3. Large RIGHT upper lobe mass with collapse of the RIGHT middle lobe. Mass is difficult to define but appears larger than comparison exam Abdomen / Pelvis Impression: 1. No acute findings abdominal aorta. 2. No evidence of vascular compromise in the abdomen pelvis. 3. No evidence of bowel ischemia. 4. The bladder is distended.  Recommend decompression. Critical Value/emergent results were called by telephone at the time of interpretation on 01/30/2019 at 7:32 pm to Dr. ; Elnora Morrison , who verbally acknowledged these results. Electronically Signed   By: Suzy Bouchard M.D.   On: 01/30/2019 19:33   US Renal  Result Date: 01/30/2019 CLINICAL DATA:  Acute kidney injury. EXAM: RENAL / URINARY TRACT ULTRASOUND COMPLETE COMPARISON:  CT angiography earlier this day. FINDINGS: Right Kidney: Renal measurements: 11.0 x 4.3 x 6.0 cm = volume: 146 mL. Echogenicity within normal limits. No mass or hydronephrosis visualized. Left Kidney: Renal measurements: 10.7 x 6.6 x 5.0 cm = volume: 182 mL. Echogenicity within normal limits. No mass or hydronephrosis visualized. Bladder: Prominently distended, as seen on CT. No definite wall thickening. Soft tissue nodule at the bladder base measures 2.0 x 1.7 x 2.3 cm. IMPRESSION: 1. Normal sonographic appearance of the kidneys. 2. Marked urinary bladder distention is seen on CT. 3. Soft tissue nodule at the bladder base measuring 2.3 cm, on CT this appears contiguous with the prostate gland, however this communication is not well delineated sonographically. Electronically Signed   By: Keith Rake M.D.   On: 01/30/2019 23:18   Dg Chest Portable 1 View  Result Date: 01/30/2019 CLINICAL  DATA:  Shortness of breath dry cough. Increased weakness. Decreased appetite and intake. History of lung cancer. EXAM: PORTABLE CHEST 1 VIEW COMPARISON:  12/16/2018 and chest CT dated 12/14/2018. FINDINGS: Stable dense consolidation at the right lung base with a centrally obstructing mass better seen on the previous CT. Clear left lung. Normal sized heart. Minimal bilateral AC joint degenerative changes. IMPRESSION: Stable dense consolidation and centrally obstructing mass at the right lung base. Electronically Signed   By: Claudie Revering M.D.   On: 01/30/2019 13:20   Vas [place] Lower Extremity Venous (dvt)  Result Date: 01/31/2019  Lower Venous Study Indications: Pulmonary embolism.  Risk Factors: Confirmed PE. Limitations: Poor ultrasound/tissue interface and patient immobility, poor patient cooperation, combative. Comparison Study: No prior studies. Performing Technologist: Oliver Hum RVT  Examination Guidelines: A complete evaluation includes B-mode imaging, spectral Doppler, color Doppler, and power Doppler as needed of all accessible portions of each vessel. Bilateral testing is considered an integral part of a complete examination. Limited examinations for reoccurring indications may be performed as noted.  +---------+---------------+---------+-----------+----------+--------------+  RIGHT     Compressibility Phasicity Spontaneity Properties Summary         +---------+---------------+---------+-----------+----------+--------------+  CFV  Partial         Yes       Yes                    Acute           +---------+---------------+---------+-----------+----------+--------------+  SFJ       Full                                                             +---------+---------------+---------+-----------+----------+--------------+  FV Prox   Full                                                             +---------+---------------+---------+-----------+----------+--------------+  FV Mid    Full                                                              +---------+---------------+---------+-----------+----------+--------------+  FV Distal Full                                                             +---------+---------------+---------+-----------+----------+--------------+  PFV       Full                                                             +---------+---------------+---------+-----------+----------+--------------+  POP       Full            No        No                                     +---------+---------------+---------+-----------+----------+--------------+  PTV       Full                                                             +---------+---------------+---------+-----------+----------+--------------+  PERO      Full                                                             +---------+---------------+---------+-----------+----------+--------------+  EIV  Full            Yes       Yes                                    +---------+---------------+---------+-----------+----------+--------------+  CIV                                                        Not visualized  +---------+---------------+---------+-----------+----------+--------------+ Unable to visualize the common iliac vein due to patient cooperation, and combativeness.  +---------+---------------+---------+-----------+----------+-------+  LEFT      Compressibility Phasicity Spontaneity Properties Summary  +---------+---------------+---------+-----------+----------+-------+  CFV       Full            Yes       Yes                             +---------+---------------+---------+-----------+----------+-------+  SFJ       Full                                                      +---------+---------------+---------+-----------+----------+-------+  FV Prox   Full                                                      +---------+---------------+---------+-----------+----------+-------+  FV Mid    Full                                                       +---------+---------------+---------+-----------+----------+-------+  FV Distal Full                                                      +---------+---------------+---------+-----------+----------+-------+  PFV       Full                                                      +---------+---------------+---------+-----------+----------+-------+  POP       Full            Yes       Yes                             +---------+---------------+---------+-----------+----------+-------+  PTV       Full                                                      +---------+---------------+---------+-----------+----------+-------+  PERO      Full                                                      +---------+---------------+---------+-----------+----------+-------+     Summary: Right: Findings consistent with acute deep vein thrombosis involving the right common femoral vein. No cystic structure found in the popliteal fossa. Left: There is no evidence of deep vein thrombosis in the lower extremity. No cystic structure found in the popliteal fossa.  *See table(s) above for measurements and observations. Electronically signed by Servando Snare MD on 01/31/2019 at 2:16:43 PM.    Final    Ct Angio Abd/pel W And/or Wo Contrast  Result Date: 01/30/2019 CLINICAL DATA:  Initial lung cancer. Concern for pulmonary embolism or ischemia bowel. EXAM: CT ANGIOGRAPHY CHEST, ABDOMEN AND PELVIS TECHNIQUE: Multidetector CT imaging through the chest, abdomen and pelvis was performed using the standard protocol during bolus administration of intravenous contrast. Multiplanar reconstructed images and MIPs were obtained and reviewed to evaluate the vascular anatomy. CONTRAST:  178mL OMNIPAQUE IOHEXOL 350 MG/ML SOLN COMPARISON:  CT 12/14/2018 FINDINGS: CTA CHEST FINDINGS Cardiovascular: Filling defect within the RIGHT lower lobe pulmonary artery (image 102/5) Filling defect within the LEFT lobe pulmonary arteries (image 97/5). Filling  defect within the LEFT upper lobe pulmonary artery. Overall clot burden is moderate. The RIGHT ventricle ratio to LEFT ventricle ratio is greater than 1 (1.36). Of note this ratio is similar to CT of 12/14/2019 when there was no pulmonary emboli. Mediastinum/Nodes: No axillary supraclavicular adenopathy. Perihilar thickening in the RIGHT hilum similar prior. Lungs/Pleura: There is collapse of the RIGHT middle lobe. Mass in the RIGHT upper lobe seen on comparison exam which not well appreciated but measures potentially 4.7 cm on image 87/5 increased from 3.8 cm on prior. Musculoskeletal: No aggressive osseous lesion. Review of the MIP images confirms the above findings. CTA ABDOMEN AND PELVIS FINDINGS VASCULAR Aorta: No dissection or aneurysm. Celiac: Widely patent. SMA: Widely patent. Renals: Single renal artery on the LEFT and 2 renal arteries on the RIGHT all widely patent. IMA: Widely patent Inflow: Normal interval calcifications of the iliac arteries. Veins: No acute finding Review of the MIP images confirms the above findings. NON-VASCULAR Hepatobiliary: No focal hepatic lesion. Pancreas: No pancreatic inflammation. Spleen: Normal. Adrenals/Urinary Tract: adrenal glands and kidneys normal. Bladder is distended to level the umbilicus. Stomach/Bowel: Small hiatal hernia. The stomach duodenum normal. No evidence of bowel ischemia no bowel dilatation appendix normal. The colon and rectosigmoid colon are normal. Lymphatic: No abdominopelvic lymphadenopathy Reproductive: Prostate nodular dense the base the bladder. Other: Evaluation of the pelvic floor is limited by the LEFT hip prosthetic Musculoskeletal: No aggressive osseous lesion Review of the MIP images confirms the above findings. IMPRESSION: Chest Impression: 1. Acute pulmonary embolism in the LEFT and RIGHT lower lobe pulmonary arteries and LEFT upper lobe. Overall clot burden is moderate. 2. Positive for acute PE with CT evidence of right heart strain  (RV/LV Ratio = 1.36). This ratio suggests RIGHT heart strain; however, the ratio of RIGHT ventricle to LEFT ventricle diameter is similar to CT of 12/14/2018 at which time there was no PE. 3. Large RIGHT upper lobe mass with collapse of the RIGHT middle lobe. Mass is difficult to define but appears larger than comparison exam Abdomen / Pelvis Impression: 1. No acute findings abdominal  aorta. 2. No evidence of vascular compromise in the abdomen pelvis. 3. No evidence of bowel ischemia. 4. The bladder is distended.  Recommend decompression. Critical Value/emergent results were called by telephone at the time of interpretation on 01/30/2019 at 7:32 pm to Dr. ; Elnora Morrison , who verbally acknowledged these results. Electronically Signed   By: Suzy Bouchard M.D.   On: 01/30/2019 19:33    ASSESSMENT AND PLAN: This is a very pleasant 78 years old African-American male with a stage IIIa non-small cell lung cancer, squamous cell carcinoma. The patient started a course of concurrent chemoradiation with weekly carboplatin for AUC of 2 and paclitaxel 45 mg/M2 status post 2 cycles but this treatment was interrupted secondary to recent hospitalization for pneumonia and pulmonary embolism. I had a lengthy discussion with the patient and his family about his condition and treatment options.  I gave him the option of palliative care and hospice referral versus resuming his treatment with concurrent chemoradiation.  The patient and his family would like to resume his treatment he will need assistant during his treatment and his daughter Joseph Art promised to be available to help with his care during the treatment. We will resume his concurrent chemoradiation next week and will refer the patient back to Dr. Sondra Come to resume his radiation. We will see the patient back for follow-up visit in 2 weeks for evaluation and management of any adverse effect of his treatment. The patient was advised to call immediately if he has any  concerning symptoms in the interval. The patient voices understanding of current disease status and treatment options and is in agreement with the current care plan.  All questions were answered. The patient knows to call the clinic with any problems, questions or concerns. We can certainly see the patient much sooner if necessary.  I spent 15 minutes counseling the patient face to face. The total time spent in the appointment was 25 minutes.  Disclaimer: This note was dictated with voice recognition software. Similar sounding words can inadvertently be transcribed and may not be corrected upon review.

## 2019-03-01 NOTE — Patient Instructions (Signed)
Steps to Quit Smoking Smoking tobacco is the leading cause of preventable death. It can affect almost every organ in the body. Smoking puts you and people around you at risk for many serious, long-lasting (chronic) diseases. Quitting smoking can be hard, but it is one of the best things that you can do for your health. It is never too late to quit. How do I get ready to quit? When you decide to quit smoking, make a plan to help you succeed. Before you quit:  Pick a date to quit. Set a date within the next 2 weeks to give you time to prepare.  Write down the reasons why you are quitting. Keep this list in places where you will see it often.  Tell your family, friends, and co-workers that you are quitting. Their support is important.  Talk with your doctor about the choices that may help you quit.  Find out if your health insurance will pay for these treatments.  Know the people, places, things, and activities that make you want to smoke (triggers). Avoid them. What first steps can I take to quit smoking?  Throw away all cigarettes at home, at work, and in your car.  Throw away the things that you use when you smoke, such as ashtrays and lighters.  Clean your car. Make sure to empty the ashtray.  Clean your home, including curtains and carpets. What can I do to help me quit smoking? Talk with your doctor about taking medicines and seeing a counselor at the same time. You are more likely to succeed when you do both.  If you are pregnant or breastfeeding, talk with your doctor about counseling or other ways to quit smoking. Do not take medicine to help you quit smoking unless your doctor tells you to do so. To quit smoking: Quit right away  Quit smoking totally, instead of slowly cutting back on how much you smoke over a period of time.  Go to counseling. You are more likely to quit if you go to counseling sessions regularly. Take medicine You may take medicines to help you quit. Some  medicines need a prescription, and some you can buy over-the-counter. Some medicines may contain a drug called nicotine to replace the nicotine in cigarettes. Medicines may:  Help you to stop having the desire to smoke (cravings).  Help to stop the problems that come when you stop smoking (withdrawal symptoms). Your doctor may ask you to use:  Nicotine patches, gum, or lozenges.  Nicotine inhalers or sprays.  Non-nicotine medicine that is taken by mouth. Find resources Find resources and other ways to help you quit smoking and remain smoke-free after you quit. These resources are most helpful when you use them often. They include:  Online chats with a counselor.  Phone quitlines.  Printed self-help materials.  Support groups or group counseling.  Text messaging programs.  Mobile phone apps. Use apps on your mobile phone or tablet that can help you stick to your quit plan. There are many free apps for mobile phones and tablets as well as websites. Examples include Quit Guide from the CDC and smokefree.gov  What things can I do to make it easier to quit?   Talk to your family and friends. Ask them to support and encourage you.  Call a phone quitline (1-800-QUIT-NOW), reach out to support groups, or work with a counselor.  Ask people who smoke to not smoke around you.  Avoid places that make you want to smoke,   such as: ? Bars. ? Parties. ? Smoke-break areas at work.  Spend time with people who do not smoke.  Lower the stress in your life. Stress can make you want to smoke. Try these things to help your stress: ? Getting regular exercise. ? Doing deep-breathing exercises. ? Doing yoga. ? Meditating. ? Doing a body scan. To do this, close your eyes, focus on one area of your body at a time from head to toe. Notice which parts of your body are tense. Try to relax the muscles in those areas. How will I feel when I quit smoking? Day 1 to 3 weeks Within the first 24 hours,  you may start to have some problems that come from quitting tobacco. These problems are very bad 2-3 days after you quit, but they do not often last for more than 2-3 weeks. You may get these symptoms:  Mood swings.  Feeling restless, nervous, angry, or annoyed.  Trouble concentrating.  Dizziness.  Strong desire for high-sugar foods and nicotine.  Weight gain.  Trouble pooping (constipation).  Feeling like you may vomit (nausea).  Coughing or a sore throat.  Changes in how the medicines that you take for other issues work in your body.  Depression.  Trouble sleeping (insomnia). Week 3 and afterward After the first 2-3 weeks of quitting, you may start to notice more positive results, such as:  Better sense of smell and taste.  Less coughing and sore throat.  Slower heart rate.  Lower blood pressure.  Clearer skin.  Better breathing.  Fewer sick days. Quitting smoking can be hard. Do not give up if you fail the first time. Some people need to try a few times before they succeed. Do your best to stick to your quit plan, and talk with your doctor if you have any questions or concerns. Summary  Smoking tobacco is the leading cause of preventable death. Quitting smoking can be hard, but it is one of the best things that you can do for your health.  When you decide to quit smoking, make a plan to help you succeed.  Quit smoking right away, not slowly over a period of time.  When you start quitting, seek help from your doctor, family, or friends. This information is not intended to replace advice given to you by your health care provider. Make sure you discuss any questions you have with your health care provider. Document Released: 03/29/2009 Document Revised: 08/20/2018 Document Reviewed: 08/21/2018 Elsevier Patient Education  2020 Elsevier Inc.  

## 2019-03-01 NOTE — Progress Notes (Signed)
Oncology Nurse Navigator Documentation  Oncology Nurse Navigator Flowsheets 03/01/2019  Diagnosis Status -  Navigator Follow Up Date: -  Navigator Follow Up Reason: -  Navigator Location [facility]  Referral Date to RadOnc/MedOnc -  Navigator Encounter Type Clinic/MDC/I spoke with patient and daughter today at clinic visit.  Richard [name] is demented and has trouble understanding treatment plan.  I help to explain to him and his daughter.  Richard [name] would like to receive treatment.  Patient is now at [facility] for rehab.  I updated Rad Onc scheduling team he is there and needs to be reschedule to re-start treatment next week.    Abnormal Finding Date -  Confirmed Diagnosis Date -  Patient Visit Type MedOnc  Treatment Phase Follow-up  Barriers/Navigation Needs Coordination of Care;Education  Education Other  Interventions Coordination of Care;Education  Coordination of Care Other  Education Method Verbal  Acuity Level 2  Time Spent with Patient 28

## 2019-03-01 NOTE — Telephone Encounter (Signed)
Called patient regarding upcoming appointments, patient's daughter would like a mailed copy of calender.

## 2019-03-07 ENCOUNTER — Inpatient Hospital Stay: Payer: Medicare HMO

## 2019-03-07 ENCOUNTER — Other Ambulatory Visit: Payer: Self-pay

## 2019-03-07 VITALS — BP 95/59 | HR 91 | Temp 98.2°F | Resp 18

## 2019-03-07 DIAGNOSIS — E7800 Pure hypercholesterolemia, unspecified: Secondary | ICD-10-CM | POA: Diagnosis not present

## 2019-03-07 DIAGNOSIS — Z86711 Personal history of pulmonary embolism: Secondary | ICD-10-CM | POA: Diagnosis not present

## 2019-03-07 DIAGNOSIS — R69 Illness, unspecified: Secondary | ICD-10-CM | POA: Diagnosis not present

## 2019-03-07 DIAGNOSIS — C3491 Malignant neoplasm of unspecified part of right bronchus or lung: Secondary | ICD-10-CM

## 2019-03-07 DIAGNOSIS — Z7901 Long term (current) use of anticoagulants: Secondary | ICD-10-CM | POA: Diagnosis not present

## 2019-03-07 DIAGNOSIS — C342 Malignant neoplasm of middle lobe, bronchus or lung: Secondary | ICD-10-CM | POA: Diagnosis not present

## 2019-03-07 DIAGNOSIS — Z79899 Other long term (current) drug therapy: Secondary | ICD-10-CM | POA: Diagnosis not present

## 2019-03-07 DIAGNOSIS — I10 Essential (primary) hypertension: Secondary | ICD-10-CM | POA: Diagnosis not present

## 2019-03-07 DIAGNOSIS — Z5111 Encounter for antineoplastic chemotherapy: Secondary | ICD-10-CM | POA: Diagnosis not present

## 2019-03-07 LAB — CMP (CANCER CENTER ONLY)
ALT: 18 U/L (ref 0–44)
AST: 18 U/L (ref 15–41)
Albumin: 2.9 g/dL — ABNORMAL LOW (ref 3.5–5.0)
Alkaline Phosphatase: 102 U/L (ref 38–126)
Anion gap: 9 (ref 5–15)
BUN: 9 mg/dL (ref 8–23)
CO2: 26 mmol/L (ref 22–32)
Calcium: 10.2 mg/dL (ref 8.9–10.3)
Chloride: 105 mmol/L (ref 98–111)
Creatinine: 0.77 mg/dL (ref 0.61–1.24)
GFR, Est AFR Am: >60 mL/min (ref >60)
GFR, Estimated: >60 mL/min (ref >60)
Glucose, Bld: 136 mg/dL — ABNORMAL HIGH (ref 70–99)
Potassium: 4.2 mmol/L (ref 3.5–5.1)
Sodium: 140 mmol/L (ref 135–145)
Total Bilirubin: 0.2 mg/dL — ABNORMAL LOW (ref 0.3–1.2)
Total Protein: 7.3 g/dL (ref 6.5–8.1)

## 2019-03-07 LAB — CBC WITH DIFFERENTIAL (CANCER CENTER ONLY)
Abs Immature Granulocytes: 0.04 10*3/uL (ref 0.00–0.07)
Basophils Absolute: 0.1 10*3/uL (ref 0.0–0.1)
Basophils Relative: 1 %
Eosinophils Absolute: 0.1 10*3/uL (ref 0.0–0.5)
Eosinophils Relative: 1 %
HCT: 31.1 % — ABNORMAL LOW (ref 39.0–52.0)
Hemoglobin: 9.8 g/dL — ABNORMAL LOW (ref 13.0–17.0)
Immature Granulocytes: 0 %
Lymphocytes Relative: 23 %
Lymphs Abs: 2 10*3/uL (ref 0.7–4.0)
MCH: 20.2 pg — ABNORMAL LOW (ref 26.0–34.0)
MCHC: 31.5 g/dL (ref 30.0–36.0)
MCV: 64.1 fL — ABNORMAL LOW (ref 80.0–100.0)
Monocytes Absolute: 1 10*3/uL (ref 0.1–1.0)
Monocytes Relative: 11 %
Neutro Abs: 5.8 10*3/uL (ref 1.7–7.7)
Neutrophils Relative %: 64 %
Platelet Count: 539 10*3/uL — ABNORMAL HIGH (ref 150–400)
RBC: 4.85 MIL/uL (ref 4.22–5.81)
RDW: 21.1 % — ABNORMAL HIGH (ref 11.5–15.5)
WBC Count: 8.9 10*3/uL (ref 4.0–10.5)
nRBC: 0.2 % (ref 0.0–0.2)

## 2019-03-07 MED ORDER — SODIUM CHLORIDE 0.9 % IV SOLN
172.8000 mg | Freq: Once | INTRAVENOUS | Status: AC
  Administered 2019-03-07: 170 mg via INTRAVENOUS
  Filled 2019-03-07: qty 17

## 2019-03-07 MED ORDER — SODIUM CHLORIDE 0.9 % IV SOLN
20.0000 mg | Freq: Once | INTRAVENOUS | Status: AC
  Administered 2019-03-07: 20 mg via INTRAVENOUS
  Filled 2019-03-07: qty 2

## 2019-03-07 MED ORDER — PALONOSETRON HCL INJECTION 0.25 MG/5ML
0.2500 mg | Freq: Once | INTRAVENOUS | Status: AC
  Administered 2019-03-07: 0.25 mg via INTRAVENOUS

## 2019-03-07 MED ORDER — SODIUM CHLORIDE 0.9 % IV SOLN
Freq: Once | INTRAVENOUS | Status: DC
  Filled 2019-03-07: qty 250

## 2019-03-07 MED ORDER — DIPHENHYDRAMINE HCL 50 MG/ML IJ SOLN
50.0000 mg | Freq: Once | INTRAMUSCULAR | Status: AC
  Administered 2019-03-07: 50 mg via INTRAVENOUS

## 2019-03-07 MED ORDER — DIPHENHYDRAMINE HCL 50 MG/ML IJ SOLN
INTRAMUSCULAR | Status: AC
  Filled 2019-03-07: qty 1

## 2019-03-07 MED ORDER — SODIUM CHLORIDE 0.9 % IV SOLN
INTRAVENOUS | Status: DC
  Administered 2019-03-07: 14:00:00 via INTRAVENOUS
  Filled 2019-03-07 (×2): qty 250

## 2019-03-07 MED ORDER — SODIUM CHLORIDE 0.9 % IV SOLN
45.0000 mg/m2 | Freq: Once | INTRAVENOUS | Status: AC
  Administered 2019-03-07: 84 mg via INTRAVENOUS
  Filled 2019-03-07: qty 14

## 2019-03-07 MED ORDER — PALONOSETRON HCL INJECTION 0.25 MG/5ML
INTRAVENOUS | Status: AC
  Filled 2019-03-07: qty 5

## 2019-03-07 MED ORDER — FAMOTIDINE IN NACL 20-0.9 MG/50ML-% IV SOLN
20.0000 mg | Freq: Once | INTRAVENOUS | Status: AC
  Administered 2019-03-07: 20 mg via INTRAVENOUS

## 2019-03-07 MED ORDER — FAMOTIDINE IN NACL 20-0.9 MG/50ML-% IV SOLN
INTRAVENOUS | Status: AC
  Filled 2019-03-07: qty 50

## 2019-03-07 NOTE — Patient Instructions (Signed)
[facility] Discharge Instructions for Patients Receiving Chemotherapy  Today you received the following chemotherapy agents Taxol, Carboplatin  To help prevent nausea and vomiting after your treatment, we encourage you to take your nausea medication as directed by your MD.   If you develop nausea and vomiting that is not controlled by your nausea medication, call the clinic.   BELOW ARE SYMPTOMS THAT SHOULD BE REPORTED IMMEDIATELY:  *FEVER GREATER THAN 100.5 F  *CHILLS WITH OR WITHOUT FEVER  NAUSEA AND VOMITING THAT IS NOT CONTROLLED WITH YOUR NAUSEA MEDICATION  *UNUSUAL SHORTNESS OF BREATH  *UNUSUAL BRUISING OR BLEEDING  TENDERNESS IN MOUTH AND THROAT WITH OR WITHOUT PRESENCE OF ULCERS  *URINARY PROBLEMS  *BOWEL PROBLEMS  UNUSUAL RASH Items with * indicate a potential emergency and should be followed up as soon as possible.  Feel free to call the clinic should you have any questions or concerns. The clinic phone number is [phone].  Please show the [facility] at check-in to the Emergency Department and triage nurse. Coronavirus (COVID-19) Are you at risk?  Are you at risk for the Coronavirus (COVID-19)?  To be considered HIGH RISK for Coronavirus (COVID-19), you have to meet the following criteria:  . Traveled to [place], [place], [place], [place] or [place]; or in the [place] to [place], [place], [place], or [place]; and have fever, cough, and shortness of breath within the last 2 weeks of travel OR . Been in close contact with a person diagnosed with COVID-19 within the last 2 weeks and have fever, cough, and shortness of breath . IF YOU DO NOT MEET THESE CRITERIA, YOU ARE CONSIDERED LOW RISK FOR COVID-19.  What to do if you are HIGH RISK for COVID-19?  Marland Kitchen If you are having a medical emergency, call 911. . Seek medical care right away. Before you go to a doctor's office, urgent care or emergency department, call ahead and tell  them about your recent travel, contact with someone diagnosed with COVID-19, and your symptoms. You should receive instructions from your physician's office regarding next steps of care.  . When you arrive at healthcare provider, tell the healthcare staff immediately you have returned from visiting [place], [place], [place], [place] or [place]; or traveled in the [place] to [place], [place], [place], or [place]; in the last two weeks or you have been in close contact with a person diagnosed with COVID-19 in the last 2 weeks.   . Tell the health care staff about your symptoms: fever, cough and shortness of breath. . After you have been seen by a medical provider, you will be either: o Tested for (COVID-19) and discharged home on quarantine except to seek medical care if symptoms worsen, and asked to  - Stay home and avoid contact with others until you get your results (4-5 days)  - Avoid travel on public transportation if possible (such as bus, train, or airplane) or o Sent to the Emergency Department by EMS for evaluation, COVID-19 testing, and possible admission depending on your condition and test results.  What to do if you are LOW RISK for COVID-19?  Reduce your risk of any infection by using the same precautions used for avoiding the common cold or flu:  Marland Kitchen Wash your hands often with soap and warm water for at least 20 seconds.  If soap and water are not readily available, use an alcohol-based hand sanitizer with at least 60% alcohol.  . If  coughing or sneezing, cover your mouth and nose by coughing or sneezing into the elbow areas of your shirt or coat, into a tissue or into your sleeve (not your hands). . Avoid shaking hands with others and consider head nods or verbal greetings only. . Avoid touching your eyes, nose, or mouth with unwashed hands.  . Avoid close contact with people who are sick. . Avoid places or events with large numbers of people in one location, like concerts or  sporting events. . Carefully consider travel plans you have or are making. . If you are planning any travel outside or inside the [place], visit the CDC's Travelers' Health webpage for the latest health notices. . If you have some symptoms but not all symptoms, continue to monitor at home and seek medical attention if your symptoms worsen. . If you are having a medical emergency, call 911.   [place] / e-Visit: [email or web address]         [facility]: [facility]: [phone]                   [facility]: [phone]

## 2019-03-09 ENCOUNTER — Ambulatory Visit: Payer: Medicare HMO | Admitting: Radiation Oncology

## 2019-03-09 ENCOUNTER — Ambulatory Visit: Payer: Medicare HMO

## 2019-03-10 ENCOUNTER — Other Ambulatory Visit: Payer: Self-pay | Admitting: *Deleted

## 2019-03-10 ENCOUNTER — Other Ambulatory Visit: Payer: Self-pay

## 2019-03-10 ENCOUNTER — Ambulatory Visit
Admission: RE | Admit: 2019-03-10 | Discharge: 2019-03-10 | Disposition: A | Discharge status: Home / Self Care | Payer: Medicare HMO | Source: Ambulatory Visit | Attending: Radiation Oncology | Admitting: Radiation Oncology

## 2019-03-10 ENCOUNTER — Encounter (INDEPENDENT_AMBULATORY_CARE_PROVIDER_SITE_OTHER): Payer: Self-pay

## 2019-03-10 ENCOUNTER — Encounter: Payer: Self-pay | Admitting: Radiation Oncology

## 2019-03-10 VITALS — BP 103/49 | HR 84 | Temp 97.3°F | Resp 18 | Ht 73.0 in | Wt 147.1 lb

## 2019-03-10 DIAGNOSIS — C3491 Malignant neoplasm of unspecified part of right bronchus or lung: Secondary | ICD-10-CM

## 2019-03-10 DIAGNOSIS — J188 Other pneumonia, unspecified organism: Secondary | ICD-10-CM | POA: Diagnosis not present

## 2019-03-10 DIAGNOSIS — C342 Malignant neoplasm of middle lobe, bronchus or lung: Secondary | ICD-10-CM | POA: Insufficient documentation

## 2019-03-10 DIAGNOSIS — Z79899 Other long term (current) drug therapy: Secondary | ICD-10-CM | POA: Insufficient documentation

## 2019-03-10 DIAGNOSIS — Z9221 Personal history of antineoplastic chemotherapy: Secondary | ICD-10-CM | POA: Diagnosis not present

## 2019-03-10 DIAGNOSIS — Z7901 Long term (current) use of anticoagulants: Secondary | ICD-10-CM | POA: Diagnosis not present

## 2019-03-10 MED ORDER — RIVAROXABAN 20 MG PO TABS: 20.0000 mg | ORAL_TABLET | Freq: Every day | ORAL | 0 refills | Status: DC

## 2019-03-10 NOTE — Progress Notes (Signed)
Thoracic Location of Tumor / Histology: DIAGNOSIS:Atleast stage IIIA(T3, N2, M0) non-small cell lung cancer, squamous cell carcinoma presented with large right middle lobe lung mass with postobstructive pneumonia as well as right hilar and mediastinal lymphadenopathy diagnosed in July 2020. He also has a suspicious small right pleural effusion and small indeterminate metabolic focus in the liver.  Patient presented with symptoms of: presented with large right middle lobe lung mass with postobstructive pneumonia as well as right hilar and mediastinal lymphadenopathy diagnosed in July 2020. He also has a suspicious small right pleural effusion and small indeterminate metabolic focus in the liver.  Biopsies: 12/16/18: Diagnosis FINE NEEDLE ASPIRATION, EBUS, RML BRUSHINGS, A (SPECIMEN 1 OF 3, COLLECTED 12/16/18): MALIGNANT CELLS CONSISTENT WITH SQUAMOUS CELL CARCINOMA.  12/16/18: Diagnosis Lung, biopsy, Right Middle Lobe - SQUAMOUS CELL CARCINOMA, SEE COMMENT.  Tobacco/Marijuana/Snuff/ETOH use: Smoking status:         Current Every Day Smoker                            Packs/day:      1.00                       Years:  60.00                       Pack years:     60.00                       Types: Cigarettes  Past/Anticipated interventions by cardiothoracic surgery, if any: None at this time.  Past/Anticipated interventions by medical oncology, if any: Per Dr. Julien Nordmann 03/01/19:  ASSESSMENT AND PLAN: This is a very pleasant 78 years old African-American male with a stage IIIa non-small cell lung cancer, squamous cell carcinoma. The patient started a course of concurrent chemoradiation with weekly carboplatin for AUC of 2 and paclitaxel 45 mg/M2 status post 2 cycles but this treatment was interrupted secondary to recent hospitalization for pneumonia and pulmonary embolism. I had a lengthy discussion with the patient and his family about his condition and treatment options.  I gave him the  option of palliative care and hospice referral versus resuming his treatment with concurrent chemoradiation.  The patient and his family would like to resume his treatment he will need assistant during his treatment and his daughter Joseph Art promised to be available to help with his care during the treatment. We will resume his concurrent chemoradiation next week and will refer the patient back to Dr. Sondra Come to resume his radiation. We will see the patient back for follow-up visit in 2 weeks for evaluation and management of any adverse effect of his treatment. The patient was advised to call immediately if he has any concerning symptoms in the interval. The patient voices understanding of current disease status and treatment options and is in agreement with the current care plan.  Signs/Symptoms  The patient continues to complain of increasing fatigue and weakness.  He was recently admitted to the hospital with pneumonia as well as pulmonary embolism.  He is currently at a skilled nursing facility.  He denied having any current chest pain but has shortness of breath with exertion with mild cough and no hemoptysis.  He denied having any fever or chills.  He has no nausea, vomiting, diarrhea or constipation.  He did not receive much radiotherapy because of his recent admission.  His chemotherapy was  also on hold for the last few weeks.    SAFETY ISSUES:  Prior radiation? No  Pacemaker/ICD? No  Possible current pregnancy? N/A  Is the patient on methotrexate? No  Current Complaints / other details:  Patient in for reconsult lung cancer. Patient is having chemotherapy starting Monday. Questions and concerns addressed.

## 2019-03-10 NOTE — Telephone Encounter (Signed)
Call from [facility] at [phone] regarding pt Xarelto refill. Reviewed with MD, refill sent to [facility]. [facility] advised she will notify pt and his daughter

## 2019-03-10 NOTE — Progress Notes (Signed)
Radiation Oncology         [phone] ________________________________  Name: Richard [name] MRN: [id number]  Date: 03/10/2019  DOB: 1941-05-14  Re-Evaluation Note  CC: Richard Perna, NP  Richard Bears, MD    ICD-10-CM   1. Stage III squamous cell carcinoma of right lung [facility])  C34.91     Diagnosis:    stage IIIA(T3, N2, M0) non-small cell lung cancer, squamous cell carcinoma presented with large right middle lobe lung mass with postobstructive pneumonia as well as right hilar and mediastinal lymphadenopathy diagnosed in July 2020.  Narrative:  The patient returns today for re-evaluation. He is accompanied by his daughter. He was seen in consultation on 01/26/2019. Unfortunately, he was admitted to the hospital on 01/30/2019 for pneumonia and pulmonary embolism. He was release to a skilled nursing facility on 02/11/2019. He restarted chemotherapy on 02/22/2019.  On review of systems, he denies any significant cough or hemoptysis.  He denies any pain in the chest area.  His breathing is stable her daughters comments.  ALLERGIES:  has No Known Allergies.  Meds: Current Outpatient Medications  Medication Sig Dispense Refill  . amiodarone (PACERONE) 200 MG tablet Take 1 tablet (200 mg total) by mouth 2 (two) times daily. 60 tablet 0  . midodrine (PROAMATINE) 5 MG tablet Take 1 tablet (5 mg total) by mouth 2 (two) times daily with a meal. 30 tablet 0  . atorvastatin (LIPITOR) 20 MG tablet Take 1 tablet (20 mg total) by mouth every evening. (Patient not taking: Reported on 03/10/2019) 90 tablet 1  . diltiazem (CARDIZEM CD) 120 MG 24 hr capsule Take 1 capsule (120 mg total) by mouth daily. (Patient not taking: Reported on 03/10/2019) 30 capsule 0  . Omega-3 Fatty Acids (FISH OIL PO) Take 1 capsule by mouth daily.    . prochlorperazine (COMPAZINE) 10 MG tablet Take 1 tablet (10 mg total) by mouth every 6 (six) hours as needed for nausea or vomiting. (Patient not taking: Reported on  03/10/2019) 30 tablet 0  . rivaroxaban (XARELTO) 20 MG TABS tablet Take 1 tablet (20 mg total) by mouth daily with supper. 30 tablet 0  . tamsulosin (FLOMAX) 0.4 MG CAPS capsule Take 1 capsule (0.4 mg total) by mouth at bedtime. (Patient not taking: Reported on 03/10/2019) 30 capsule 0   No current facility-administered medications for this encounter.     Physical Findings: The patient is in no acute distress. Patient is alert and oriented.  height is 6\' 1"  (1.854 m) and weight is 147 lb 2 oz (66.7 kg). His temporal temperature is 97.3 F (36.3 C) (abnormal). His blood pressure is 103/49 (abnormal) and his pulse is 84. His respiration is 18 and oxygen saturation is 100%. Marland Kitchen   He remained in a wheelchair through the exam. Lungs are clear to auscultation bilaterally. Heart has regular rate and rhythm. No palpable cervical, supraclavicular, or axillary adenopathy. Abdomen soft, non-tender, normal bowel sounds. Patient continues to show signs of significant dementia but is cooperative today. His daughter who is healthcare power of attorney accompanies him today who is very helpful with the patient's agitation.  Lab Findings: Lab Results  Component Value Date   WBC 8.9 03/07/2019   HGB 9.8 (L) 03/07/2019   HCT 31.1 (L) 03/07/2019   MCV 64.1 (L) 03/07/2019   PLT 539 (H) 03/07/2019    Radiographic Findings: No results found.  Impression:   stage IIIA(T3, N2, M0) non-small cell lung cancer, squamous cell carcinoma  Patient has decided  to pursue radiation therapy and chemotherapy as part of his treatment. He began chemotherapy earlier this week and appears to be tolerating this well. Patient could not stay today for CT simulation. He will return early next week and we will put a rush on his planning so he can begin treatment next week, since he has already started chemotherapy.  Consent form was signed in the presence of the patient's daughter.  Plan:  He is scheduled for CT simulation on  9/28/200.   ____________________________________ Gery Pray, MD   This document serves as a record of services personally performed by Gery Pray, MD. It was created on his behalf by Wilburn Mylar, a trained medical scribe. The creation of this record is based on the scribe's personal observations and the provider's statements to them. This document has been checked and approved by the attending provider.

## 2019-03-10 NOTE — Patient Instructions (Signed)
Coronavirus (COVID-19) Are you at risk?  Are you at risk for the Coronavirus (COVID-19)?  To be considered HIGH RISK for Coronavirus (COVID-19), you have to meet the following criteria:  . Traveled to [place], [place], [place], [place] or [place]; or in the [place] to [place], [place], [place], or [place]; and have fever, cough, and shortness of breath within the last 2 weeks of travel OR . Been in close contact with a person diagnosed with COVID-19 within the last 2 weeks and have fever, cough, and shortness of breath . IF YOU DO NOT MEET THESE CRITERIA, YOU ARE CONSIDERED LOW RISK FOR COVID-19.  What to do if you are HIGH RISK for COVID-19?  . If you are having a medical emergency, call 911. . Seek medical care right away. Before you go to a doctor's office, urgent care or emergency department, call ahead and tell them about your recent travel, contact with someone diagnosed with COVID-19, and your symptoms. You should receive instructions from your physician's office regarding next steps of care.  . When you arrive at healthcare provider, tell the healthcare staff immediately you have returned from visiting [place], [place], [place], [place] or [place]; or traveled in the [place] to [place], [place], [place], or [place]; in the last two weeks or you have been in close contact with a person diagnosed with COVID-19 in the last 2 weeks.   . Tell the health care staff about your symptoms: fever, cough and shortness of breath. . After you have been seen by a medical provider, you will be either: o Tested for (COVID-19) and discharged home on quarantine except to seek medical care if symptoms worsen, and asked to  - Stay home and avoid contact with others until you get your results (4-5 days)  - Avoid travel on public transportation if possible (such as bus, train, or airplane) or o Sent to the Emergency Department by EMS for evaluation, COVID-19 testing, and possible  admission depending on your condition and test results.  What to do if you are LOW RISK for COVID-19?  Reduce your risk of any infection by using the same precautions used for avoiding the common cold or flu:  . Wash your hands often with soap and warm water for at least 20 seconds.  If soap and water are not readily available, use an alcohol-based hand sanitizer with at least 60% alcohol.  . If coughing or sneezing, cover your mouth and nose by coughing or sneezing into the elbow areas of your shirt or coat, into a tissue or into your sleeve (not your hands). . Avoid shaking hands with others and consider head nods or verbal greetings only. . Avoid touching your eyes, nose, or mouth with unwashed hands.  . Avoid close contact with people who are sick. . Avoid places or events with large numbers of people in one location, like concerts or sporting events. . Carefully consider travel plans you have or are making. . If you are planning any travel outside or inside the [place], visit the CDC's Travelers' Health webpage for the latest health notices. . If you have some symptoms but not all symptoms, continue to monitor at home and seek medical attention if your symptoms worsen. . If you are having a medical emergency, call 911.   ADDITIONAL HEALTHCARE OPTIONS FOR PATIENTS  [facility] / e-Visit: [email or web address]         [facility]: [phone]  [facility]: [phone]                   [facility]: [phone]   

## 2019-03-14 ENCOUNTER — Ambulatory Visit: Payer: Medicare HMO | Admitting: Radiation Oncology

## 2019-03-14 ENCOUNTER — Other Ambulatory Visit: Payer: Self-pay

## 2019-03-14 ENCOUNTER — Emergency Department (HOSPITAL_COMMUNITY)
Admission: EM | Admit: 2019-03-14 | Discharge: 2019-03-14 | Disposition: A | Discharge status: Home / Self Care | Payer: Medicare HMO | Attending: Emergency Medicine | Admitting: Emergency Medicine

## 2019-03-14 ENCOUNTER — Emergency Department (HOSPITAL_COMMUNITY): Payer: Medicare HMO

## 2019-03-14 ENCOUNTER — Encounter (HOSPITAL_COMMUNITY): Payer: Self-pay

## 2019-03-14 DIAGNOSIS — R531 Weakness: Secondary | ICD-10-CM | POA: Diagnosis not present

## 2019-03-14 DIAGNOSIS — Z7901 Long term (current) use of anticoagulants: Secondary | ICD-10-CM | POA: Insufficient documentation

## 2019-03-14 DIAGNOSIS — F17210 Nicotine dependence, cigarettes, uncomplicated: Secondary | ICD-10-CM | POA: Diagnosis not present

## 2019-03-14 DIAGNOSIS — Z79899 Other long term (current) drug therapy: Secondary | ICD-10-CM | POA: Insufficient documentation

## 2019-03-14 DIAGNOSIS — I959 Hypotension, unspecified: Secondary | ICD-10-CM | POA: Diagnosis not present

## 2019-03-14 DIAGNOSIS — C3490 Malignant neoplasm of unspecified part of unspecified bronchus or lung: Secondary | ICD-10-CM | POA: Diagnosis not present

## 2019-03-14 DIAGNOSIS — R69 Illness, unspecified: Secondary | ICD-10-CM | POA: Diagnosis not present

## 2019-03-14 DIAGNOSIS — R1084 Generalized abdominal pain: Secondary | ICD-10-CM | POA: Diagnosis not present

## 2019-03-14 DIAGNOSIS — I10 Essential (primary) hypertension: Secondary | ICD-10-CM | POA: Insufficient documentation

## 2019-03-14 DIAGNOSIS — F0390 Unspecified dementia without behavioral disturbance: Secondary | ICD-10-CM | POA: Insufficient documentation

## 2019-03-14 DIAGNOSIS — J181 Lobar pneumonia, unspecified organism: Secondary | ICD-10-CM | POA: Diagnosis not present

## 2019-03-14 DIAGNOSIS — R52 Pain, unspecified: Secondary | ICD-10-CM | POA: Diagnosis not present

## 2019-03-14 LAB — CBC WITH DIFFERENTIAL/PLATELET
Abs Immature Granulocytes: 0.12 10*3/uL — ABNORMAL HIGH (ref 0.00–0.07)
Basophils Absolute: 0 10*3/uL (ref 0.0–0.1)
Basophils Relative: 0 %
Eosinophils Absolute: 0 10*3/uL (ref 0.0–0.5)
Eosinophils Relative: 0 %
HCT: 29.8 % — ABNORMAL LOW (ref 39.0–52.0)
Hemoglobin: 9.5 g/dL — ABNORMAL LOW (ref 13.0–17.0)
Immature Granulocytes: 1 %
Lymphocytes Relative: 16 %
Lymphs Abs: 1.8 10*3/uL (ref 0.7–4.0)
MCH: 20.5 pg — ABNORMAL LOW (ref 26.0–34.0)
MCHC: 31.9 g/dL (ref 30.0–36.0)
MCV: 64.2 fL — ABNORMAL LOW (ref 80.0–100.0)
Monocytes Absolute: 0.6 10*3/uL (ref 0.1–1.0)
Monocytes Relative: 5 %
Neutro Abs: 8.7 10*3/uL — ABNORMAL HIGH (ref 1.7–7.7)
Neutrophils Relative %: 78 %
Platelets: 490 10*3/uL — ABNORMAL HIGH (ref 150–400)
RBC: 4.64 MIL/uL (ref 4.22–5.81)
RDW: 21.5 % — ABNORMAL HIGH (ref 11.5–15.5)
WBC: 11.2 10*3/uL — ABNORMAL HIGH (ref 4.0–10.5)
nRBC: 0.3 % — ABNORMAL HIGH (ref 0.0–0.2)

## 2019-03-14 LAB — BASIC METABOLIC PANEL
Anion gap: 12 (ref 5–15)
BUN: 12 mg/dL (ref 8–23)
CO2: 20 mmol/L — ABNORMAL LOW (ref 22–32)
Calcium: 9.1 mg/dL (ref 8.9–10.3)
Chloride: 104 mmol/L (ref 98–111)
Creatinine, Ser: 0.57 mg/dL — ABNORMAL LOW (ref 0.61–1.24)
GFR calc Af Amer: >60 mL/min (ref >60)
GFR calc non Af Amer: >60 mL/min (ref >60)
Glucose, Bld: 149 mg/dL — ABNORMAL HIGH (ref 70–99)
Potassium: 4.1 mmol/L (ref 3.5–5.1)
Sodium: 136 mmol/L (ref 135–145)

## 2019-03-14 LAB — HEPATIC FUNCTION PANEL
ALT: 21 U/L (ref 0–44)
AST: 20 U/L (ref 15–41)
Albumin: 3.1 g/dL — ABNORMAL LOW (ref 3.5–5.0)
Alkaline Phosphatase: 84 U/L (ref 38–126)
Bilirubin, Direct: 0.1 mg/dL (ref 0.0–0.2)
Indirect Bilirubin: 0.3 mg/dL (ref 0.3–0.9)
Total Bilirubin: 0.4 mg/dL (ref 0.3–1.2)
Total Protein: 6.8 g/dL (ref 6.5–8.1)

## 2019-03-14 NOTE — ED Notes (Signed)
Attempted IV x2 without success  

## 2019-03-14 NOTE — ED Provider Notes (Signed)
[facility] DEPT Provider Note   CSN: [id number] Arrival date & time: 03/14/19  1347     History   Chief Complaint Chief Complaint  Patient presents with  . Weakness    HPI Richard [name] is a 78 y.o. male.     The history is provided by the patient.  Illness Location:  General Quality:  Weakness Severity:  Mild Onset quality:  Gradual Timing:  Constant Progression:  Unchanged Chronicity:  New Context:  Patient with history of lung cancer on chemotherapy/radiation who presents the ED with generalized weakness.  No specific symptoms.  No abdominal pain, nausea, vomiting, diarrhea, chest pain.  States decreased diet.  Feels better after IV fluids with EMS. Relieved by:  Fluids Worsened by:  Nothing Associated symptoms: fatigue   Associated symptoms: no abdominal pain, no chest pain, no cough, no ear pain, no fever, no rash, no shortness of breath, no sore throat and no vomiting     Past Medical History:  Diagnosis Date  . Dementia ([place])   . High cholesterol   . Hypertension   . Lung mass 12/2018    Patient Active Problem List   Diagnosis Date Noted  . Atrial fibrillation with RVR ([place])   . Palliative care by specialist   . DNR (do not resuscitate) discussion   . Severe sepsis ([place]) 01/30/2019  . Acute pulmonary embolism ([place]) 01/30/2019  . Acute respiratory failure with hypoxia ([place]) 01/30/2019  . AKI (acute kidney injury) ([place]) 01/30/2019  . Poor appetite 01/05/2019  . Goals of care, counseling/discussion 12/28/2018  . Encounter for antineoplastic chemotherapy 12/28/2018  . 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 Dementia without behavioral disturbance ([place]) 12/16/2018  . Lung mass 12/15/2018  . Pelvic mass in male 12/15/2018  . Postobstructive pneumonia 12/15/2018  . Hypertension     Past Surgical History:  Procedure Laterality Date  . JOINT REPLACEMENT    . VIDEO BRONCHOSCOPY WITH ENDOBRONCHIAL  ULTRASOUND N/A 12/16/2018   Procedure: VIDEO BRONCHOSCOPY WITH ENDOBRONCHIAL ULTRASOUND AND FLUROSCOPY;  Surgeon: [name], MD;  Location: [facility];  Service: Pulmonary;  Laterality: N/A;        Home Medications    Prior to Admission medications   Medication Sig Start Date End Date Taking? Authorizing Provider  amiodarone (PACERONE) 200 MG tablet Take 1 tablet (200 mg total) by mouth 2 (two) times daily. 02/11/19  Yes Aline August, MD  atorvastatin (LIPITOR) 20 MG tablet Take 1 tablet (20 mg total) by mouth every evening. 12/31/18  Yes [name], NP  diltiazem (CARDIZEM CD) 120 MG 24 hr capsule Take 1 capsule (120 mg total) by mouth daily. 02/11/19  Yes Aline August, MD  midodrine (PROAMATINE) 5 MG tablet Take 1 tablet (5 mg total) by mouth 2 (two) times daily with a meal. 02/11/19  Yes Aline August, MD  Omega-3 Fatty Acids (FISH OIL PO) Take 1 capsule by mouth daily.   Yes [provider]  prochlorperazine (COMPAZINE) 10 MG tablet Take 1 tablet (10 mg total) by mouth every 6 (six) hours as needed for nausea or vomiting. 01/12/19  Yes Heilingoetter, Cassandra L, PA-C  tamsulosin (FLOMAX) 0.4 MG CAPS capsule Take 1 capsule (0.4 mg total) by mouth at bedtime. 02/11/19  Yes Aline August, MD  rivaroxaban (XARELTO) 20 MG TABS tablet Take 1 tablet (20 mg total) by mouth daily with supper. 03/10/19   Curt Bears, MD    Family History Family History  Problem  Relation Age of Onset  . Stomach cancer Mother   . COPD Father   . Lung cancer Brother   . Lung cancer Brother     Social History Social History   Tobacco Use  . Smoking status: Current Some Day Smoker    Packs/day: 1.00    Years: 60.00    Pack years: 60.00    Types: Cigarettes  . Smokeless tobacco: Never Used  Substance Use Topics  . Alcohol use: Yes    Comment: occasional  . Drug use: No     Allergies   Patient has no known allergies.   Review of Systems Review of Systems  Constitutional:  Positive for appetite change and fatigue. Negative for chills and fever.  HENT: Negative for ear pain and sore throat.   Eyes: Negative for pain and visual disturbance.  Respiratory: Negative for cough and shortness of breath.   Cardiovascular: Negative for chest pain and palpitations.  Gastrointestinal: Negative for abdominal pain and vomiting.  Genitourinary: Negative for dysuria and hematuria.  Musculoskeletal: Negative for arthralgias and back pain.  Skin: Negative for color change and rash.  Neurological: Positive for weakness. Negative for seizures and syncope.  All other systems reviewed and are negative.    Physical Exam Updated Vital Signs  ED Triage Vitals  Enc Vitals Group     BP 03/14/19 1515 125/62     Pulse Rate 03/14/19 1515 73     Resp 03/14/19 1515 (!) 25     Temp 03/14/19 1515 97.8 F (36.6 C)     Temp src --      SpO2 03/14/19 1515 98 %     Weight --      Height --      Head Circumference --      Peak Flow --      Pain Score 03/14/19 1530 0     Pain Loc --      Pain Edu? --      Excl. in [place]? --      Physical Exam Vitals signs and nursing note reviewed.  Constitutional:      General: He is not in acute distress.    Appearance: He is well-developed. He is not ill-appearing.  HENT:     Head: Normocephalic and atraumatic.     Nose: Nose normal.     Mouth/Throat:     Mouth: Mucous membranes are moist.  Eyes:     Extraocular Movements: Extraocular movements intact.     Conjunctiva/sclera: Conjunctivae normal.     Pupils: Pupils are equal, round, and reactive to light.  Neck:     Musculoskeletal: Normal range of motion and neck supple.  Cardiovascular:     Rate and Rhythm: Normal rate and regular rhythm.     Pulses: Normal pulses.     Heart sounds: Normal heart sounds. No murmur.  Pulmonary:     Effort: Pulmonary effort is normal. No respiratory distress.     Breath sounds: Normal breath sounds.  Abdominal:     General: Abdomen is flat. There  is no distension.     Palpations: Abdomen is soft.     Tenderness: There is no abdominal tenderness.  Musculoskeletal: Normal range of motion.  Skin:    General: Skin is warm and dry.     Capillary Refill: Capillary refill takes less than 2 seconds.  Neurological:     General: No focal deficit present.     Mental Status: He is alert and oriented to person,  place, and time.     Cranial Nerves: No cranial nerve deficit.     Sensory: No sensory deficit.     Motor: No weakness.     Coordination: Coordination normal.     Gait: Gait normal.  Psychiatric:        Mood and Affect: Mood normal.      ED Treatments / Results  Labs (all labs ordered are listed, but only abnormal results are displayed) Labs Reviewed  CBC WITH DIFFERENTIAL/PLATELET - Abnormal; Notable for the following components:      Result Value   WBC 11.2 (*)    Hemoglobin 9.5 (*)    HCT 29.8 (*)    MCV 64.2 (*)    MCH 20.5 (*)    RDW 21.5 (*)    Platelets 490 (*)    nRBC 0.3 (*)    Neutro Abs 8.7 (*)    Abs Immature Granulocytes 0.12 (*)    All other components within normal limits  BASIC METABOLIC PANEL - Abnormal; Notable for the following components:   CO2 20 (*)    Glucose, Bld 149 (*)    Creatinine, Ser 0.57 (*)    All other components within normal limits  HEPATIC FUNCTION PANEL - Abnormal; Notable for the following components:   Albumin 3.1 (*)    All other components within normal limits    EKG None  Radiology Dg Chest Portable 1 View  Result Date: 03/14/2019 CLINICAL DATA:  Pt with onset of weakness and fatigue. Hx of lung cancer. Pt is currently undergoing chemo and was supposed to start radiation treatment today. EXAM: PORTABLE CHEST 1 VIEW COMPARISON:  Chest CT 02/05/2019, chest x-ray 02/01/2019 FINDINGS: Stable cardiomediastinal contours with normal heart size. Persistent right basilar consolidation previously demonstrated to be total atelectasis of the right middle lobe due to a right hilar  mass. No new focal opacity bilaterally. No pneumothorax or definite large effusion. No acute finding in the visualized skeleton. IMPRESSION: 1. Stable chest with persistent right basilar consolidation previously shown to be total atelectasis of the right middle lobe due to a right hilar mass. 2. No new acute findings. Electronically Signed   By: Audie Pinto M.D.   On: 03/14/2019 15:14    Procedures Procedures (including critical care time)  Medications Ordered in ED Medications - No data to display   Initial Impression / Assessment and Plan / ED Course  I have reviewed the triage vital signs and the nursing notes.  Pertinent labs & imaging results that were available during my care of the patient were reviewed by me and considered in my medical decision making (see chart for details).     Richard [name] is a 78 year old male with history of lung cancer on chemotherapy/radiation, hypertension, high cholesterol who presents the ED with generalized weakness.  Patient generally feels weak with poor appetite over the last several days.  Denies any specific chest pain, shortness of breath, abdominal pain.  Patient is on blood thinner for prior PE.  Overall appears well.  Does not have any specific complaints.  Feels much better after IV fluids with EMS.  Will get basic labs including chest x-ray to evaluate for any electrolyte abnormality, AKI, infectious process.  Unlikely PE given that he is on blood thinner with no hypoxia or tachycardia.  Doubt cardiac process.  Chest x-ray shows no acute findings.  No effusion, no infectious findings.  No pneumothorax.  Lab work shows no significant anemia, electrolyte abnormality, kidney injury.  Overall  lab work is reassuring.  Patient feels much better after IV fluids that was given by EMS.  Does not have any urinary symptoms.  Did not want to stay any longer to give a urine sample.  Felt better want to go home, believe that this is reasonable.  Patient  was given return precautions and discharged in the ED in good condition.  This chart was dictated using voice recognition software.  Despite best efforts to proofread,  errors can occur which can change the documentation meaning.    Final Clinical Impressions(s) / ED Diagnoses   Final diagnoses:  Weakness    ED Discharge Orders    None       Lennice Sites, DO 03/14/19 1743

## 2019-03-14 NOTE — ED Triage Notes (Signed)
Patient is from home with complaints of weakness and abdominal pain. Earlier he had SOB but resolved when EMS arrived. Patient was scheduled to receive treatment at the cancer center, but was unable due to the above complaints. 500 cc of fluid was administered by EMS. No complaints of nausea, vomiting or diarrhea.   EMS vitals: 113/70 BP 76 HR 135 CBG 16 RR 98.1 Temp

## 2019-03-14 NOTE — Discharge Instructions (Signed)
Please return to the emergency department if symptoms worsen.

## 2019-03-15 ENCOUNTER — Inpatient Hospital Stay: Payer: Medicare HMO

## 2019-03-15 ENCOUNTER — Inpatient Hospital Stay: Payer: Medicare HMO | Admitting: Physician Assistant

## 2019-03-16 ENCOUNTER — Other Ambulatory Visit: Payer: Self-pay

## 2019-03-16 ENCOUNTER — Encounter: Payer: Self-pay | Admitting: Nutrition

## 2019-03-16 ENCOUNTER — Ambulatory Visit
Admission: RE | Admit: 2019-03-16 | Discharge: 2019-03-16 | Disposition: A | Discharge status: Home / Self Care | Payer: Medicare HMO | Source: Ambulatory Visit | Attending: Radiation Oncology | Admitting: Radiation Oncology

## 2019-03-16 DIAGNOSIS — C342 Malignant neoplasm of middle lobe, bronchus or lung: Secondary | ICD-10-CM | POA: Diagnosis not present

## 2019-03-16 DIAGNOSIS — C3491 Malignant neoplasm of unspecified part of right bronchus or lung: Secondary | ICD-10-CM

## 2019-03-16 NOTE — Progress Notes (Signed)
Provided second complimentary case of Ensure Enlive.

## 2019-03-16 NOTE — Progress Notes (Signed)
  Radiation Oncology         [phone] ________________________________  Name: Richard [name] MRN: [id number]  Date: 03/16/2019  DOB: [birth year]  SIMULATION AND TREATMENT PLANNING NOTE    ICD-10-CM   1. Stage III squamous cell carcinoma of right lung (HCC)  C34.91     DIAGNOSIS:  Stage IIIA(T3, N2, M0) non-small cell lung cancer, squamous cell carcinoma presented with large right middle lobe lung mass with postobstructive pneumonia as well as right hilar and mediastinal lymphadenopathy diagnosed in July 2020  NARRATIVE:  The patient was brought to the [facility].  Identity was confirmed.  All relevant records and images related to the planned course of therapy were reviewed.  The patient freely provided informed written consent to proceed with treatment after reviewing the details related to the planned course of therapy. The consent form was witnessed and verified by the simulation staff.  Then, the patient was set-up in a stable reproducible  supine position for radiation therapy.  CT images were obtained.  Surface markings were placed.  The CT images were loaded into the planning software.  Then the target and avoidance structures were contoured.  Treatment planning then occurred.  The radiation prescription was entered and confirmed.  Then, I designed and supervised the construction of a total of 5 medically necessary complex treatment devices.  I have requested : 3D Simulation  I have requested a DVH of the following structures:, Lungs, esophagus spinal cord.  I have ordered:CBC  PLAN:  The patient will receive 60 Gy in 30 fractions along with radiosensitizing chemotherapy.    Special Treatment Procedure Note: The patient will be receiving radiosensitizing chemotherapy. Given the potential of increased toxicities related to combined therapy and the necessity for close monitoring of the patient and blood work, this constitutes a special treatment procedure.    -----------------------------------  Blair Promise, PhD, MD  This document serves as a record of services personally performed by Gery Pray, MD. It was created on his behalf by Wilburn Mylar, a trained medical scribe. The creation of this record is based on the scribe's personal observations and the provider's statements to them. This document has been checked and approved by the attending provider.

## 2019-03-17 ENCOUNTER — Ambulatory Visit: Payer: Medicare HMO | Admitting: Radiation Oncology

## 2019-03-17 DIAGNOSIS — C342 Malignant neoplasm of middle lobe, bronchus or lung: Secondary | ICD-10-CM | POA: Diagnosis not present

## 2019-03-18 ENCOUNTER — Ambulatory Visit: Payer: Medicare HMO

## 2019-03-21 ENCOUNTER — Inpatient Hospital Stay: Payer: Medicare HMO

## 2019-03-21 ENCOUNTER — Encounter: Payer: Self-pay | Admitting: Physician Assistant

## 2019-03-21 ENCOUNTER — Other Ambulatory Visit: Payer: Self-pay

## 2019-03-21 ENCOUNTER — Ambulatory Visit: Payer: Medicare HMO

## 2019-03-21 ENCOUNTER — Ambulatory Visit
Admission: RE | Admit: 2019-03-21 | Discharge: 2019-03-21 | Disposition: A | Discharge status: Home / Self Care | Payer: Medicare HMO | Source: Ambulatory Visit | Attending: Radiation Oncology | Admitting: Radiation Oncology

## 2019-03-21 ENCOUNTER — Inpatient Hospital Stay (HOSPITAL_BASED_OUTPATIENT_CLINIC_OR_DEPARTMENT_OTHER): Payer: Medicare HMO | Admitting: Physician Assistant

## 2019-03-21 ENCOUNTER — Inpatient Hospital Stay: Payer: Medicare HMO | Attending: Internal Medicine

## 2019-03-21 VITALS — BP 104/47 | HR 76 | Temp 98.2°F | Resp 18 | Ht 73.0 in | Wt 145.4 lb

## 2019-03-21 DIAGNOSIS — C342 Malignant neoplasm of middle lobe, bronchus or lung: Secondary | ICD-10-CM | POA: Diagnosis not present

## 2019-03-21 DIAGNOSIS — C3491 Malignant neoplasm of unspecified part of right bronchus or lung: Secondary | ICD-10-CM

## 2019-03-21 DIAGNOSIS — E7800 Pure hypercholesterolemia, unspecified: Secondary | ICD-10-CM | POA: Insufficient documentation

## 2019-03-21 DIAGNOSIS — I959 Hypotension, unspecified: Secondary | ICD-10-CM | POA: Diagnosis not present

## 2019-03-21 DIAGNOSIS — Z7901 Long term (current) use of anticoagulants: Secondary | ICD-10-CM | POA: Diagnosis not present

## 2019-03-21 DIAGNOSIS — Z5111 Encounter for antineoplastic chemotherapy: Secondary | ICD-10-CM | POA: Diagnosis not present

## 2019-03-21 DIAGNOSIS — Z86711 Personal history of pulmonary embolism: Secondary | ICD-10-CM | POA: Diagnosis not present

## 2019-03-21 DIAGNOSIS — F0390 Unspecified dementia without behavioral disturbance: Secondary | ICD-10-CM | POA: Insufficient documentation

## 2019-03-21 DIAGNOSIS — I10 Essential (primary) hypertension: Secondary | ICD-10-CM | POA: Diagnosis not present

## 2019-03-21 DIAGNOSIS — Z79899 Other long term (current) drug therapy: Secondary | ICD-10-CM | POA: Diagnosis not present

## 2019-03-21 DIAGNOSIS — R69 Illness, unspecified: Secondary | ICD-10-CM | POA: Diagnosis not present

## 2019-03-21 LAB — CBC WITH DIFFERENTIAL (CANCER CENTER ONLY)
Abs Immature Granulocytes: 0.05 10*3/uL (ref 0.00–0.07)
Basophils Absolute: 0.1 10*3/uL (ref 0.0–0.1)
Basophils Relative: 1 %
Eosinophils Absolute: 0.2 10*3/uL (ref 0.0–0.5)
Eosinophils Relative: 2 %
HCT: 31.1 % — ABNORMAL LOW (ref 39.0–52.0)
Hemoglobin: 9.9 g/dL — ABNORMAL LOW (ref 13.0–17.0)
Immature Granulocytes: 1 %
Lymphocytes Relative: 33 %
Lymphs Abs: 2.6 10*3/uL (ref 0.7–4.0)
MCH: 20 pg — ABNORMAL LOW (ref 26.0–34.0)
MCHC: 31.8 g/dL (ref 30.0–36.0)
MCV: 63 fL — ABNORMAL LOW (ref 80.0–100.0)
Monocytes Absolute: 1.1 10*3/uL — ABNORMAL HIGH (ref 0.1–1.0)
Monocytes Relative: 14 %
Neutro Abs: 4 10*3/uL (ref 1.7–7.7)
Neutrophils Relative %: 49 %
Platelet Count: 525 10*3/uL — ABNORMAL HIGH (ref 150–400)
RBC: 4.94 MIL/uL (ref 4.22–5.81)
RDW: 22.1 % — ABNORMAL HIGH (ref 11.5–15.5)
WBC Count: 7.9 10*3/uL (ref 4.0–10.5)
nRBC: 0.6 % — ABNORMAL HIGH (ref 0.0–0.2)

## 2019-03-21 LAB — CMP (CANCER CENTER ONLY)
ALT: 13 U/L (ref 0–44)
AST: 14 U/L — ABNORMAL LOW (ref 15–41)
Albumin: 2.9 g/dL — ABNORMAL LOW (ref 3.5–5.0)
Alkaline Phosphatase: 103 U/L (ref 38–126)
Anion gap: 11 (ref 5–15)
BUN: 10 mg/dL (ref 8–23)
CO2: 26 mmol/L (ref 22–32)
Calcium: 9.8 mg/dL (ref 8.9–10.3)
Chloride: 104 mmol/L (ref 98–111)
Creatinine: 0.74 mg/dL (ref 0.61–1.24)
GFR, Est AFR Am: >60 mL/min (ref >60)
GFR, Estimated: >60 mL/min (ref >60)
Glucose, Bld: 123 mg/dL — ABNORMAL HIGH (ref 70–99)
Potassium: 4.1 mmol/L (ref 3.5–5.1)
Sodium: 141 mmol/L (ref 135–145)
Total Bilirubin: 0.3 mg/dL (ref 0.3–1.2)
Total Protein: 7.2 g/dL (ref 6.5–8.1)

## 2019-03-21 MED ORDER — FAMOTIDINE IN NACL 20-0.9 MG/50ML-% IV SOLN
20.0000 mg | Freq: Once | INTRAVENOUS | Status: AC
  Administered 2019-03-21: 15:00:00 20 mg via INTRAVENOUS

## 2019-03-21 MED ORDER — SODIUM CHLORIDE 0.9 % IV SOLN
172.8000 mg | Freq: Once | INTRAVENOUS | Status: AC
  Administered 2019-03-21: 170 mg via INTRAVENOUS
  Filled 2019-03-21: qty 17

## 2019-03-21 MED ORDER — DIPHENHYDRAMINE HCL 50 MG/ML IJ SOLN
INTRAMUSCULAR | Status: AC
  Filled 2019-03-21: qty 1

## 2019-03-21 MED ORDER — SODIUM CHLORIDE 0.9 % IV SOLN
20.0000 mg | Freq: Once | INTRAVENOUS | Status: AC
  Administered 2019-03-21: 20 mg via INTRAVENOUS
  Filled 2019-03-21: qty 20

## 2019-03-21 MED ORDER — SODIUM CHLORIDE 0.9 % IV SOLN
Freq: Once | INTRAVENOUS | Status: AC
  Administered 2019-03-21: 15:00:00 via INTRAVENOUS
  Filled 2019-03-21: qty 250

## 2019-03-21 MED ORDER — FAMOTIDINE IN NACL 20-0.9 MG/50ML-% IV SOLN
INTRAVENOUS | Status: AC
  Filled 2019-03-21: qty 50

## 2019-03-21 MED ORDER — DIPHENHYDRAMINE HCL 50 MG/ML IJ SOLN
50.0000 mg | Freq: Once | INTRAMUSCULAR | Status: AC
  Administered 2019-03-21: 50 mg via INTRAVENOUS

## 2019-03-21 MED ORDER — PALONOSETRON HCL INJECTION 0.25 MG/5ML
INTRAVENOUS | Status: AC
  Filled 2019-03-21: qty 5

## 2019-03-21 MED ORDER — SODIUM CHLORIDE 0.9 % IV SOLN
Freq: Once | INTRAVENOUS | Status: DC
  Filled 2019-03-21: qty 250

## 2019-03-21 MED ORDER — SODIUM CHLORIDE 0.9 % IV SOLN
45.0000 mg/m2 | Freq: Once | INTRAVENOUS | Status: AC
  Administered 2019-03-21: 84 mg via INTRAVENOUS
  Filled 2019-03-21: qty 14

## 2019-03-21 MED ORDER — PALONOSETRON HCL INJECTION 0.25 MG/5ML
0.2500 mg | Freq: Once | INTRAVENOUS | Status: AC
  Administered 2019-03-21: 0.25 mg via INTRAVENOUS

## 2019-03-21 NOTE — Progress Notes (Signed)
[facility] OFFICE PROGRESS NOTE  [name], NP [address] [address]  DIAGNOSIS: Atleast stage IIIA(T3, N2, M0) non-small cell lung cancer, squamous cell carcinoma presented with large right middle lobe lung mass with postobstructive pneumonia as well as right hilar and mediastinal lymphadenopathy diagnosed in July 2020. He also has a suspicious small right pleural effusion and small indeterminate metabolic focus in the liver.  PRIOR THERAPY: None  CURRENT THERAPY: Concurrent chemoradiation with Carboplatin for an AUC of 2 and Paclitaxel 45 mg/m2. First dose expected on 01/24/2019.  Status post 3 cycles.  INTERVAL HISTORY: Richard [name] 78 y.o. male returns to the clinic for a follow-up visit.  He is accompanied by his daughter today.  The patient's treatment have been on hold for a few weeks secondary to the patient being admitted to the hospital for pneumonia and a pulmonary embolism.  The patient's recently restarted his treatment with concurrent chemoradiation on 02/22/2019. He missed his appointment last week due to being in the emergency room for the chief complaint of weakness. Since resuming his treatment, he has been tolerating it well without any adverse side effects. He denies fevers, chills, night sweats, or weight loss. His daughter states that he is often dehydrated and felt better after his ER visit after receiving fluids. He denies any chest pain or hemoptysis. he reports his baseline shortness of breath with exertion and cough. He denies any nausea, vomiting, diarrhea, or abdominal pain. He reports constipation and cannot recall his last bowel movement. He denies any headaches or visual changes. His last day of radiation is scheduled for 04/29/2019. He is here today for evaluation before starting cycle #4 of weekly concurrent chemoradiation.   MEDICAL HISTORY: Past Medical History:  Diagnosis Date  . Dementia ([place])   . High cholesterol    . Hypertension   . Lung mass 12/2018    ALLERGIES:  has No Known Allergies.  MEDICATIONS:  Current Outpatient Medications  Medication Sig Dispense Refill  . amiodarone (PACERONE) 200 MG tablet Take 1 tablet (200 mg total) by mouth 2 (two) times daily. 60 tablet 0  . atorvastatin (LIPITOR) 20 MG tablet Take 1 tablet (20 mg total) by mouth every evening. 90 tablet 1  . diltiazem (CARDIZEM CD) 120 MG 24 hr capsule Take 1 capsule (120 mg total) by mouth daily. 30 capsule 0  . midodrine (PROAMATINE) 5 MG tablet Take 1 tablet (5 mg total) by mouth 2 (two) times daily with a meal. 30 tablet 0  . Omega-3 Fatty Acids (FISH OIL PO) Take 1 capsule by mouth daily.    . prochlorperazine (COMPAZINE) 10 MG tablet Take 1 tablet (10 mg total) by mouth every 6 (six) hours as needed for nausea or vomiting. 30 tablet 0  . rivaroxaban (XARELTO) 20 MG TABS tablet Take 1 tablet (20 mg total) by mouth daily with supper. 30 tablet 0  . tamsulosin (FLOMAX) 0.4 MG CAPS capsule Take 1 capsule (0.4 mg total) by mouth at bedtime. 30 capsule 0   No current facility-administered medications for this visit.    Facility-Administered Medications Ordered in Other Visits  Medication Dose Route Frequency Provider Last Rate Last Dose  . 0.9 %  sodium chloride infusion   Intravenous Once Curt Bears, MD        SURGICAL HISTORY:  Past Surgical History:  Procedure Laterality Date  . JOINT REPLACEMENT    . VIDEO BRONCHOSCOPY WITH ENDOBRONCHIAL ULTRASOUND N/A 12/16/2018   Procedure: VIDEO BRONCHOSCOPY WITH  ENDOBRONCHIAL ULTRASOUND AND FLUROSCOPY;  Surgeon: [name], MD;  Location: [facility];  Service: Pulmonary;  Laterality: N/A;    REVIEW OF SYSTEMS:   Review of Systems  Constitutional: Positive for fatigue and generalized weakness. Negative for appetite change, chills, fever and unexpected weight change.  HENT: Negative for mouth sores, nosebleeds, sore throat and trouble swallowing.   Eyes: Negative for eye  problems and icterus.  Respiratory: Positive for baseline cough or shortness of breath. Negative for hemoptysis and wheezing.   Cardiovascular: Negative for chest pain and leg swelling.  Gastrointestinal: Positive for constipation. Negative for abdominal pain,  diarrhea, nausea and vomiting.  Genitourinary: Negative for bladder incontinence, difficulty urinating, dysuria, frequency and hematuria.   Musculoskeletal: Negative for back pain, gait problem, neck pain and neck stiffness.  Skin: Negative for itching and rash.  Neurological: Negative for dizziness, extremity weakness, gait problem, headaches, light-headedness and seizures.  Hematological: Negative for adenopathy. Does not bruise/bleed easily.  Psychiatric/Behavioral: Negative for confusion, depression and sleep disturbance. The patient is not nervous/anxious.     PHYSICAL EXAMINATION:  Blood pressure (!) 104/47, pulse 76, temperature 98.2 F (36.8 C), temperature source Temporal, resp. rate 18, height 6\' 1"  (1.854 m), weight 145 lb 6.4 oz (66 kg), SpO2 100 %.  ECOG PERFORMANCE STATUS: 1 - Symptomatic but completely ambulatory  Physical Exam  Constitutional: Oriented to person, place, and time and thin and chronically ill appearing male and in no distress.  HENT:  Head: Normocephalic and atraumatic.  Mouth/Throat: Oropharynx is clear and moist. No oropharyngeal exudate.  Eyes: Conjunctivae are normal. Right eye exhibits no discharge. Left eye exhibits no discharge. No scleral icterus.  Neck: Normal range of motion. Neck supple.  Cardiovascular: Normal rate, regular rhythm, normal heart sounds and intact distal pulses.   Pulmonary/Chest: Effort normal. Decreased breath sounds in all lung fields. No respiratory distress. No wheezes. No rales.  Abdominal: Soft. Exhibits no distension and no mass. There is no tenderness.  Musculoskeletal: Normal range of motion. Exhibits no edema.  Lymphadenopathy:    No cervical adenopathy.   Neurological: Alert. Exhibits normal muscle tone. Gait normal. Coordination normal.  Skin: Skin is warm and dry. No rash noted. Not diaphoretic. No erythema. No pallor.  Psychiatric: Mood, memory and judgment normal.  Vitals reviewed.  LABORATORY DATA: Lab Results  Component Value Date   WBC 7.9 03/21/2019   HGB 9.9 (L) 03/21/2019   HCT 31.1 (L) 03/21/2019   MCV 63.0 (L) 03/21/2019   PLT 525 (H) 03/21/2019      Chemistry      Component Value Date/Time   NA 141 03/21/2019 1233   NA 145 (H) 10/29/2016 1513   K 4.1 03/21/2019 1233   CL 104 03/21/2019 1233   CO2 26 03/21/2019 1233   BUN 10 03/21/2019 1233   BUN 12 10/29/2016 1513   CREATININE 0.74 03/21/2019 1233      Component Value Date/Time   CALCIUM 9.8 03/21/2019 1233   ALKPHOS 103 03/21/2019 1233   AST 14 (L) 03/21/2019 1233   ALT 13 03/21/2019 1233   BILITOT 0.3 03/21/2019 1233       RADIOGRAPHIC STUDIES:  Dg Chest Portable 1 View  Result Date: 03/14/2019 CLINICAL DATA:  Pt with onset of weakness and fatigue. Hx of lung cancer. Pt is currently undergoing chemo and was supposed to start radiation treatment today. EXAM: PORTABLE CHEST 1 VIEW COMPARISON:  Chest CT 02/05/2019, chest x-ray 02/01/2019 FINDINGS: Stable cardiomediastinal contours with normal heart size. Persistent right  basilar consolidation previously demonstrated to be total atelectasis of the right middle lobe due to a right hilar mass. No new focal opacity bilaterally. No pneumothorax or definite large effusion. No acute finding in the visualized skeleton. IMPRESSION: 1. Stable chest with persistent right basilar consolidation previously shown to be total atelectasis of the right middle lobe due to a right hilar mass. 2. No new acute findings. Electronically Signed   By: Audie Pinto M.D.   On: 03/14/2019 15:14     ASSESSMENT/PLAN:  This is a very pleasant 78 year old african [place] male with atleast stage IIIA(T3, N2, M0) non-small cell lung  cancer, squamous cell carcinoma presented with large right middle lobe lung mass with postobstructive pneumonia as well as right hilar and mediastinal lymphadenopathy diagnosed in July 2020. He also has a suspicious small right pleural effusion and small indeterminate metabolic focus in the liver.  The patient started a course of concurrent chemoradiation with weekly carboplatin for AUC of 2 and paclitaxel 45 mg/M2 status post 2 cycles but this treatment was interrupted secondary to recent hospitalization for pneumonia and pulmonary embolism. He restarted treatment on 02/22/2019. He is status post 3 cycles of weekly concurrent chemoradiation. His last radiation treatment is scheduled for 04/29/2019 under the care of Dr. Sondra Come.   The patient was seen with Dr. Julien Nordmann today. Labs were reviewed. Dr. Julien Nordmann recommends that he proceed with cycle #4 today as scheduled.   We will see him back for a follow up visit in 2 weeks for evaluation before starting cycle #6.   The patient will receive an additional 1 L of normal saline today for his poor fluid intake.   I provided the patient and his daughter with constipation management education.   The patient was supposed to follow up with cardiology after his hospitalization in August 2020. It appears that they tried to get in touch with the patient while he was at [facility]. I have provided the patient's daughter with the number to their clinic so she may schedule a hospital follow up appointment at their convenience.    The patient was advised to call immediately if he has any concerning symptoms in the interval. The patient voices understanding of current disease status and treatment options and is in agreement with the current care plan. All questions were answered. The patient knows to call the clinic with any problems, questions or concerns. We can certainly see the patient much sooner if necessary  No orders of the defined types were placed in this  encounter.    Oney Folz L Ellias Mcelreath, PA-C 03/21/19  ADDENDUM: Hematology/Oncology Attending: I had a face-to-face encounter with the patient today.  I recommended his care plan.  This is a very pleasant 78 years old African-American male with a stage IIIa non-small cell lung cancer, squamous cell carcinoma.  He is currently undergoing concurrent chemoradiation with weekly carboplatin and paclitaxel status post 2 cycles.  He has been tolerating his chemotherapy fairly well with no significant adverse effects. I recommended for him to proceed with cycle #3 today as planned. For the lack of appetite and dehydration, will arrange for the patient to receive 1 L of normal saline in the clinic today. For the constipation he was advised of constipation management regimen. The patient will come back for follow-up visit in 2 weeks for evaluation before starting cycle #5. He was advised to call immediately if he has any concerning symptoms in the interval.  Disclaimer: This note was dictated with voice  recognition software. Similar sounding words can inadvertently be transcribed and may be missed upon review. Eilleen Kempf, MD 03/21/19

## 2019-03-21 NOTE — Patient Instructions (Signed)
[facility] Discharge Instructions for Patients Receiving Chemotherapy  Today you received the following chemotherapy agents:  Taxol, Carboplatin  To help prevent nausea and vomiting after your treatment, we encourage you to take your nausea medication as prescribed.   If you develop nausea and vomiting that is not controlled by your nausea medication, call the clinic.   BELOW ARE SYMPTOMS THAT SHOULD BE REPORTED IMMEDIATELY:  *FEVER GREATER THAN 100.5 F  *CHILLS WITH OR WITHOUT FEVER  NAUSEA AND VOMITING THAT IS NOT CONTROLLED WITH YOUR NAUSEA MEDICATION  *UNUSUAL SHORTNESS OF BREATH  *UNUSUAL BRUISING OR BLEEDING  TENDERNESS IN MOUTH AND THROAT WITH OR WITHOUT PRESENCE OF ULCERS  *URINARY PROBLEMS  *BOWEL PROBLEMS  UNUSUAL RASH Items with * indicate a potential emergency and should be followed up as soon as possible.  Feel free to call the clinic should you have any questions or concerns. The clinic phone number is [phone].  Please show the CHEMO ALERT CARD at check-in to the Emergency Department and triage nurse.   

## 2019-03-21 NOTE — Patient Instructions (Signed)
It is common for patients who are undergoing treatment and taking certain prescribed medications to experience side-effects with constipation.  If you experience constipation, please take stool softener such as Colace or Senna one tablet twice a day everyday to avoid constipation.  These medications are available over the counter.  Of course, if you have diarrhea, stop taking stool softeners.  Drinking plenty of fluid, eating fruits and vegetable, and being active also reduces the risk of constipation.   If despite taking stool softeners, and you still have no bowel movement for 2 days or more than your normal bowel habit frequency, please take one of the following over the counter laxatives:  MiraLax, Milk of Magnesia or Mag Citrate everyday and contact me immediately for further instructions.  The goal is to have at least one bowel movement every other day.   

## 2019-03-21 NOTE — Progress Notes (Signed)
  Radiation Oncology         [phone] ________________________________  Name: Richard [name] MRN: [id number]  Date: 03/21/2019  DOB: May 04, 1941  Simulation Verification Note    ICD-10-CM   1. 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 The clinical setup was verified. Then port films were obtained and uploaded to the radiation oncology medical record software.  The treatment beams were carefully compared against the planned radiation fields. The position location and shape of the radiation fields was reviewed. They targeted volume of tissue appears to be appropriately covered by the radiation beams. Organs at risk appear to be excluded as planned.  Based on my personal review, I approved the simulation verification. The patient's treatment will proceed as planned.  -----------------------------------  Blair Promise, PhD, MD

## 2019-03-22 ENCOUNTER — Other Ambulatory Visit: Payer: Self-pay | Admitting: Radiation Oncology

## 2019-03-22 ENCOUNTER — Telehealth: Payer: Self-pay | Admitting: Physician Assistant

## 2019-03-22 ENCOUNTER — Other Ambulatory Visit: Payer: Self-pay

## 2019-03-22 ENCOUNTER — Ambulatory Visit
Admission: RE | Admit: 2019-03-22 | Discharge: 2019-03-22 | Disposition: A | Discharge status: Home / Self Care | Payer: Medicare HMO | Source: Ambulatory Visit | Attending: Radiation Oncology | Admitting: Radiation Oncology

## 2019-03-22 DIAGNOSIS — C3491 Malignant neoplasm of unspecified part of right bronchus or lung: Secondary | ICD-10-CM

## 2019-03-22 DIAGNOSIS — C342 Malignant neoplasm of middle lobe, bronchus or lung: Secondary | ICD-10-CM | POA: Diagnosis not present

## 2019-03-22 DIAGNOSIS — I4891 Unspecified atrial fibrillation: Secondary | ICD-10-CM

## 2019-03-22 MED ORDER — SONAFINE EX EMUL
1.0000 "application " | Freq: Two times a day (BID) | CUTANEOUS | Status: DC
  Administered 2019-03-22: 1 via TOPICAL

## 2019-03-22 NOTE — Telephone Encounter (Signed)
Scheduled appt per 10/5 los - pt to get an updated schedule next visit.

## 2019-03-23 ENCOUNTER — Emergency Department (HOSPITAL_COMMUNITY)
Admission: EM | Admit: 2019-03-23 | Discharge: 2019-03-23 | Disposition: A | Discharge status: Home / Self Care | Payer: Medicare HMO | Attending: Emergency Medicine | Admitting: Emergency Medicine

## 2019-03-23 ENCOUNTER — Other Ambulatory Visit: Payer: Self-pay

## 2019-03-23 ENCOUNTER — Encounter (HOSPITAL_COMMUNITY): Payer: Self-pay | Admitting: Emergency Medicine

## 2019-03-23 ENCOUNTER — Emergency Department (HOSPITAL_COMMUNITY): Payer: Medicare HMO

## 2019-03-23 ENCOUNTER — Ambulatory Visit
Admission: RE | Admit: 2019-03-23 | Discharge: 2019-03-23 | Disposition: A | Discharge status: Home / Self Care | Payer: Medicare HMO | Source: Ambulatory Visit | Attending: Radiation Oncology | Admitting: Radiation Oncology

## 2019-03-23 ENCOUNTER — Inpatient Hospital Stay (HOSPITAL_BASED_OUTPATIENT_CLINIC_OR_DEPARTMENT_OTHER): Payer: Medicare HMO | Admitting: Medical

## 2019-03-23 DIAGNOSIS — Y939 Activity, unspecified: Secondary | ICD-10-CM | POA: Diagnosis not present

## 2019-03-23 DIAGNOSIS — F17210 Nicotine dependence, cigarettes, uncomplicated: Secondary | ICD-10-CM | POA: Insufficient documentation

## 2019-03-23 DIAGNOSIS — M79642 Pain in left hand: Secondary | ICD-10-CM | POA: Diagnosis not present

## 2019-03-23 DIAGNOSIS — W050XXA Fall from non-moving wheelchair, initial encounter: Secondary | ICD-10-CM | POA: Insufficient documentation

## 2019-03-23 DIAGNOSIS — Z79899 Other long term (current) drug therapy: Secondary | ICD-10-CM | POA: Diagnosis not present

## 2019-03-23 DIAGNOSIS — R69 Illness, unspecified: Secondary | ICD-10-CM | POA: Diagnosis not present

## 2019-03-23 DIAGNOSIS — S0990XA Unspecified injury of head, initial encounter: Secondary | ICD-10-CM | POA: Diagnosis not present

## 2019-03-23 DIAGNOSIS — I10 Essential (primary) hypertension: Secondary | ICD-10-CM | POA: Diagnosis not present

## 2019-03-23 DIAGNOSIS — Y999 Unspecified external cause status: Secondary | ICD-10-CM | POA: Insufficient documentation

## 2019-03-23 DIAGNOSIS — S098XXA Other specified injuries of head, initial encounter: Secondary | ICD-10-CM

## 2019-03-23 DIAGNOSIS — C342 Malignant neoplasm of middle lobe, bronchus or lung: Secondary | ICD-10-CM | POA: Diagnosis not present

## 2019-03-23 DIAGNOSIS — Y929 Unspecified place or not applicable: Secondary | ICD-10-CM | POA: Insufficient documentation

## 2019-03-23 DIAGNOSIS — W19XXXA Unspecified fall, initial encounter: Secondary | ICD-10-CM

## 2019-03-23 MED ORDER — ACETAMINOPHEN 500 MG PO TABS
1000.0000 mg | ORAL_TABLET | Freq: Once | ORAL | Status: AC
  Administered 2019-03-23: 1000 mg via ORAL
  Filled 2019-03-23: qty 2

## 2019-03-23 NOTE — ED Triage Notes (Signed)
Patient from cancer center, reports patient tripped while getting up out of wheelchair after leaving radiation today. States hit head on cement. Takes xarelto. Denies LOC. Hx dementia. Daughter at bedside.

## 2019-03-23 NOTE — Progress Notes (Signed)
Richard [name] was seen in clinic today and was also seen by Dr.Kinard in radiation oncology.  As he was leaving the cancer center he caught his foot in the foot rest of his wheelchair and fell forward hitting his superior mid frontal skull.  He was taken to the emergency room for evaluation and management.  He has a history of dementia.  He did not lose consciousness.  He is on Xarelto.  Dr. Julien Nordmann was made aware of this situation.  Sandi Mealy, MH S, PA-C Physician Assistant

## 2019-03-23 NOTE — ED Provider Notes (Signed)
[facility] DEPT Provider Note   CSN: [id number] Arrival date & time: 03/23/19  1510     History   Chief Complaint Chief Complaint  Patient presents with   Fall    HPI Richard [name] is a 78 y.o. male.     78 yo M with a chief complaints of a fall.  Family states that he was trying to get out of the wheelchair and his foot got stuck on the pedal and he fell.  Landed on his head.  Complaining of headache and left pinky pain.  Denies other injury denies neck pain denies chest pain denies abdominal pain denies lower extremity pain.  Denies back pain.  Family states that typically he walks with out assistance however he does had radiation therapy and due to the length of the walk they opted for a wheelchair.  The history is provided by the patient.  Fall This is a new problem. The current episode started less than 1 hour ago. The problem occurs constantly. The problem has not changed since onset.Associated symptoms include headaches. Pertinent negatives include no chest pain, no abdominal pain and no shortness of breath. Nothing aggravates the symptoms. Nothing relieves the symptoms. He has tried nothing for the symptoms. The treatment provided no relief.    Past Medical History:  Diagnosis Date   Dementia ([place])    High cholesterol    Hypertension    Lung mass 12/2018    Patient Active Problem List   Diagnosis Date Noted   Atrial fibrillation with RVR [facility])    Palliative care by specialist    DNR (do not resuscitate) discussion    Severe sepsis ([place]) 01/30/2019   Acute pulmonary embolism ([place]) 01/30/2019   Acute respiratory failure with hypoxia ([place]) 01/30/2019   AKI (acute kidney injury) ([place]) 01/30/2019   Poor appetite 01/05/2019   Goals of care, counseling/discussion 12/28/2018   Encounter for antineoplastic chemotherapy 12/28/2018   Stage III squamous cell carcinoma of right lung ([place]) 12/28/2018   Dementia without  behavioral disturbance ([place]) 12/16/2018   Lung mass 12/15/2018   Pelvic mass in male 12/15/2018   Postobstructive pneumonia 12/15/2018   Hypertension     Past Surgical History:  Procedure Laterality Date   JOINT REPLACEMENT     VIDEO BRONCHOSCOPY WITH ENDOBRONCHIAL ULTRASOUND N/A 12/16/2018   Procedure: VIDEO BRONCHOSCOPY WITH ENDOBRONCHIAL ULTRASOUND AND FLUROSCOPY;  Surgeon: [name], MD;  Location: [facility] OR;  Service: Pulmonary;  Laterality: N/A;        Home Medications    Prior to Admission medications   Medication Sig Start Date End Date Taking? Authorizing Provider  amiodarone (PACERONE) 200 MG tablet Take 1 tablet (200 mg total) by mouth 2 (two) times daily. 02/11/19  Yes Aline August, MD  atorvastatin (LIPITOR) 20 MG tablet Take 1 tablet (20 mg total) by mouth every evening. 12/31/18  Yes [name], NP  diltiazem (CARDIZEM CD) 120 MG 24 hr capsule Take 1 capsule (120 mg total) by mouth daily. 02/11/19  Yes Aline August, MD  midodrine (PROAMATINE) 5 MG tablet Take 1 tablet (5 mg total) by mouth 2 (two) times daily with a meal. 02/11/19  Yes Aline August, MD  Omega-3 Fatty Acids (FISH OIL PO) Take 1 capsule by mouth daily.   Yes [provider]  prochlorperazine (COMPAZINE) 10 MG tablet Take 1 tablet (10 mg total) by mouth every 6 (six) hours as needed for nausea or vomiting. 01/12/19  Yes Heilingoetter, Cassandra L, PA-C  rivaroxaban (XARELTO) 20 MG TABS tablet Take 1 tablet (20 mg total) by mouth daily with supper. 03/10/19  Yes Curt Bears, MD  tamsulosin (FLOMAX) 0.4 MG CAPS capsule Take 1 capsule (0.4 mg total) by mouth at bedtime. 02/11/19  Yes Aline August, MD    Family History Family History  Problem Relation Age of Onset   Stomach cancer Mother    COPD Father    Lung cancer Brother    Lung cancer Brother     Social History Social History   Tobacco Use   Smoking status: Current Some Day Smoker    Packs/day: 1.00     Years: 60.00    Pack years: 60.00    Types: Cigarettes   Smokeless tobacco: Never Used  Substance Use Topics   Alcohol use: Yes    Comment: occasional   Drug use: No     Allergies   Patient has no known allergies.   Review of Systems Review of Systems  Constitutional: Negative for chills and fever.  HENT: Negative for congestion and facial swelling.   Eyes: Negative for discharge and visual disturbance.  Respiratory: Negative for shortness of breath.   Cardiovascular: Negative for chest pain and palpitations.  Gastrointestinal: Negative for abdominal pain, diarrhea and vomiting.  Musculoskeletal: Negative for arthralgias and myalgias.  Skin: Negative for color change and rash.  Neurological: Positive for headaches. Negative for tremors and syncope.  Psychiatric/Behavioral: Negative for confusion and dysphoric mood.     Physical Exam Updated Vital Signs BP (!) 121/57 (BP Location: Right Arm)    Pulse 68    Temp 97.9 F (36.6 C) (Axillary)    Resp 18    SpO2 100%   Physical Exam Vitals signs and nursing note reviewed.  Constitutional:      Appearance: He is well-developed.  HENT:     Head: Normocephalic.     Comments: 2 abrasions to the forehead Eyes:     Pupils: Pupils are equal, round, and reactive to light.  Neck:     Musculoskeletal: Normal range of motion and neck supple.     Vascular: No JVD.  Cardiovascular:     Rate and Rhythm: Normal rate and regular rhythm.     Heart sounds: No murmur. No friction rub. No gallop.   Pulmonary:     Effort: No respiratory distress.     Breath sounds: No wheezing.  Abdominal:     General: There is no distension.     Tenderness: There is no guarding or rebound.  Musculoskeletal: Normal range of motion.     Comments: Small avulsion to the left pinky, superficial.  Full range of motion of the finger.  No palpable tenderness or crepitus.  Palpated from head to toe without any other noted areas of bony tenderness.  Skin:     Coloration: Skin is not pale.     Findings: No rash.  Neurological:     Mental Status: He is alert and oriented to person, place, and time.  Psychiatric:        Behavior: Behavior normal.      ED Treatments / Results  Labs (all labs ordered are listed, but only abnormal results are displayed) Labs Reviewed - No data to display  EKG None  Radiology Ct Head Wo Contrast  Result Date: 03/23/2019 CLINICAL DATA:  Trip and fall EXAM: CT HEAD WITHOUT CONTRAST TECHNIQUE: Contiguous axial images were obtained from the base of the skull through the vertex without intravenous contrast. COMPARISON:  MR  brain, 01/04/2019 FINDINGS: Brain: No evidence of acute infarction, hemorrhage, hydrocephalus, extra-axial collection or mass lesion/mass effect. Redemonstrated ventriculomegaly and chronic global volume loss. Vascular: No hyperdense vessel or unexpected calcification. Skull: Normal. Negative for fracture or focal lesion. Sinuses/Orbits: No acute finding. Other: None. IMPRESSION: 1.  No acute intracranial pathology. 2. Redemonstrated ventriculomegaly and chronic global volume loss, unchanged compared to prior examination. Electronically Signed   By: Eddie Candle M.D.   On: 03/23/2019 16:51   Dg Hand Complete Left  Result Date: 03/23/2019 CLINICAL DATA:  Fall.  Left hand pain.  Initial encounter. EXAM: LEFT HAND - COMPLETE 3+ VIEW COMPARISON:  None. FINDINGS: Mild soft swelling is present at the IP joint of the thumb. There is no underlying fracture. Mild degenerative changes are present at the joint. No other acute bone or soft tissue abnormalities are present. The wrist is located. IMPRESSION: 1. Soft tissue swelling at the IP joint of the thumb without acute fracture. 2. Otherwise unremarkable hand radiographs. Electronically Signed   By: San Morelle M.D.   On: 03/23/2019 16:29    Procedures Procedures (including critical care time)  Medications Ordered in ED Medications  acetaminophen  (TYLENOL) tablet 1,000 mg (1,000 mg Oral Given 03/23/19 1606)     Initial Impression / Assessment and Plan / ED Course  I have reviewed the triage vital signs and the nursing notes.  Pertinent labs & imaging results that were available during my care of the patient were reviewed by me and considered in my medical decision making (see chart for details).        78 yo M with a chief complaint of a fall.  Non-syncopal in nature.  Patient has an abrasion to the forehead.  Is on Xarelto.  Will obtain a CT scan of the head.  Plain film of the left hand.  Reassess.  Plain film of the left hand negative for fracture given me.  CT the head is also negative for acute change.  Will discharge patient home.  4:58 PM:  I have discussed the diagnosis/risks/treatment options with the patient and family and believe the pt to be eligible for discharge home to follow-up with PCP. We also discussed returning to the ED immediately if new or worsening sx occur. We discussed the sx which are most concerning (e.g., sudden worsening pain, fever, inability to tolerate by mouth) that necessitate immediate return. Medications administered to the patient during their visit and any new prescriptions provided to the patient are listed below.  Medications given during this visit Medications  acetaminophen (TYLENOL) tablet 1,000 mg (1,000 mg Oral Given 03/23/19 1606)     The patient appears reasonably screen and/or stabilized for discharge and I doubt any other medical condition or other [facility] requiring further screening, evaluation, or treatment in the ED at this time prior to discharge.    Final Clinical Impressions(s) / ED Diagnoses   Final diagnoses:  Fall, initial encounter  Minor head injury, initial encounter  Left hand pain    ED Discharge Orders    None       Deno Etienne, DO 03/23/19 1658

## 2019-03-23 NOTE — ED Notes (Signed)
XR at bedside

## 2019-03-23 NOTE — Discharge Instructions (Signed)
Please return for worsening headaches or confusion.  Follow up with your family doc.

## 2019-03-24 ENCOUNTER — Encounter: Payer: Self-pay | Admitting: Cardiology

## 2019-03-24 ENCOUNTER — Other Ambulatory Visit: Payer: Self-pay

## 2019-03-24 ENCOUNTER — Ambulatory Visit (INDEPENDENT_AMBULATORY_CARE_PROVIDER_SITE_OTHER): Payer: Medicare HMO | Admitting: Cardiology

## 2019-03-24 ENCOUNTER — Telehealth: Payer: Self-pay | Admitting: Cardiology

## 2019-03-24 ENCOUNTER — Ambulatory Visit
Admission: RE | Admit: 2019-03-24 | Discharge: 2019-03-24 | Disposition: A | Discharge status: Home / Self Care | Payer: Medicare HMO | Source: Ambulatory Visit | Attending: Radiation Oncology | Admitting: Radiation Oncology

## 2019-03-24 VITALS — BP 97/62 | HR 60 | Temp 97.2°F | Ht 74.0 in | Wt 150.0 lb

## 2019-03-24 DIAGNOSIS — E7800 Pure hypercholesterolemia, unspecified: Secondary | ICD-10-CM

## 2019-03-24 DIAGNOSIS — Z86711 Personal history of pulmonary embolism: Secondary | ICD-10-CM

## 2019-03-24 DIAGNOSIS — Z7189 Other specified counseling: Secondary | ICD-10-CM | POA: Diagnosis not present

## 2019-03-24 DIAGNOSIS — R0602 Shortness of breath: Secondary | ICD-10-CM

## 2019-03-24 DIAGNOSIS — I480 Paroxysmal atrial fibrillation: Secondary | ICD-10-CM | POA: Diagnosis not present

## 2019-03-24 DIAGNOSIS — C3491 Malignant neoplasm of unspecified part of right bronchus or lung: Secondary | ICD-10-CM | POA: Diagnosis not present

## 2019-03-24 DIAGNOSIS — C342 Malignant neoplasm of middle lobe, bronchus or lung: Secondary | ICD-10-CM | POA: Diagnosis not present

## 2019-03-24 MED ORDER — AMIODARONE HCL 200 MG PO TABS: 200.0000 mg | ORAL_TABLET | Freq: Every day | ORAL | 0 refills | Status: DC

## 2019-03-24 NOTE — Progress Notes (Signed)
Cardiology Office Note:    Date:  03/24/2019   ID:  Richard [name], Richard [name] 12/26/1940, MRN [id number]  PCP:  [name], NP  Cardiologist:  Candee Furbish, MD  Referring MD: Gery Pray, MD   CC: post ER/hospitalization follow up  History of Present Illness:    Richard [name] is a 78 y.o. male with a hx of NSCLC, stage 3A (squamous), dementia, hypertension, HLD who is seen as a new patient to me at the request of Gery Pray, MD for the evaluation and management of atrial fibrillation with RVR. Pre records, he was referred to cardiology initially 01/2019 after a hospitalization but was in a skilled nursing facility at the time. He was seen in the hospital 01/2019 by Dr. Marlou Porch.  Of note, he was seen in the ER yesterday for a fall, hit his head. These notes were reviewed.  Cardiac history: noted to have paroxysmal atrial flutter during a hospitalization for acute pulmonary embolism 01/2019. Was placed on amiodarone for rhythm control, diltiazem for rate control, and rivaroxaban for anticoagulation (initially PE dosing)  Today: Continues to be very short of breath with minimal exertion. Sometimes also when lying down. Occasional PND as well. No LE edema. No chest pain specifically, though does have to stop and rest. No syncope, no frequent falls but did fall after foot got caught on wheelchair. No palpitations or fast heartbeats.   Had a time off rivaroxaban due to high cost, but then used a coupon and it was affordable.   Past Medical History:  Diagnosis Date  . Dementia ([place])   . High cholesterol   . Hypertension   . Lung mass 12/2018    Past Surgical History:  Procedure Laterality Date  . JOINT REPLACEMENT    . VIDEO BRONCHOSCOPY WITH ENDOBRONCHIAL ULTRASOUND N/A 12/16/2018   Procedure: VIDEO BRONCHOSCOPY WITH ENDOBRONCHIAL ULTRASOUND AND FLUROSCOPY;  Surgeon: [name], MD;  Location: [facility];  Service: Pulmonary;  Laterality: N/A;    Current Medications: Current  Outpatient Medications on File Prior to Visit  Medication Sig  . diltiazem (CARDIZEM CD) 120 MG 24 hr capsule Take 1 capsule (120 mg total) by mouth daily.  . midodrine (PROAMATINE) 5 MG tablet Take 1 tablet (5 mg total) by mouth 2 (two) times daily with a meal.  . Omega-3 Fatty Acids (FISH OIL PO) Take 1 capsule by mouth daily.  . prochlorperazine (COMPAZINE) 10 MG tablet Take 1 tablet (10 mg total) by mouth every 6 (six) hours as needed for nausea or vomiting.  . rivaroxaban (XARELTO) 20 MG TABS tablet Take 1 tablet (20 mg total) by mouth daily with supper.  Marland Kitchen atorvastatin (LIPITOR) 20 MG tablet Take 1 tablet (20 mg total) by mouth every evening. (Patient not taking: Reported on 03/24/2019)  . tamsulosin (FLOMAX) 0.4 MG CAPS capsule Take 1 capsule (0.4 mg total) by mouth at bedtime. (Patient not taking: Reported on 03/24/2019)   No current facility-administered medications on file prior to visit.      Allergies:   Patient has no known allergies.   Social History   Tobacco Use  . Smoking status: Current Some Day Smoker    Packs/day: 1.00    Years: 60.00    Pack years: 60.00    Types: Cigarettes  . Smokeless tobacco: Never Used  Substance Use Topics  . Alcohol use: Yes    Comment: occasional  . Drug use: No    Family History: family history includes COPD in his father; Lung cancer in his  brother and brother; Stomach cancer in his mother.  ROS:   Please see the history of present illness.  Additional pertinent ROS: Constitutional: Negative for chills, fever, night sweats, unintentional weight loss  HENT: Negative for ear pain and hearing loss.   Eyes: Negative for loss of vision and eye pain.  Respiratory: Negative for cough, sputum, wheezing.   Cardiovascular: See HPI. Gastrointestinal: Negative for abdominal pain, melena, and hematochezia.  Genitourinary: Negative for dysuria and hematuria.  Musculoskeletal: Negative for falls and myalgias.  Skin: Negative for itching and  rash.  Neurological: Negative for focal weakness, focal sensory changes and loss of consciousness.  Endo/Heme/Allergies: Does not bruise/bleed easily.     EKGs/Labs/Other Studies Reviewed:    The following studies were reviewed today: Echo 01/31/19  1. The left ventricle has normal systolic function with an ejection fraction of 60-65%. The cavity size was normal. Left ventricular diastolic Doppler parameters are indeterminate. No evidence of left ventricular regional wall motion abnormalities.  2. The right ventricle has normal systolic function. The cavity was normal. There is no increase in right ventricular wall thickness. Right ventricular systolic pressure is severely elevated (61mmHg)  3. Tricuspid valve regurgitation is moderate-severe.  4. The aorta is normal unless otherwise noted.  5. Tachycardia, sinus, 118bpm.  EKG:  EKG is personally reviewed.  The ekg ordered today demonstrates NSR at 78 bpm  Recent Labs: 02/04/2019: TSH 2.966 02/11/2019: Magnesium 2.0 03/21/2019: ALT 13; BUN 10; Creatinine 0.74; Hemoglobin 9.9; Platelet Count 525; Potassium 4.1; Sodium 141  Recent Lipid Panel    Component Value Date/Time   CHOL 252 (H) 10/29/2016 1513   TRIG 188 (H) 10/29/2016 1513   HDL 62 10/29/2016 1513   CHOLHDL 4.1 10/29/2016 1513   LDLCALC 152 (H) 10/29/2016 1513    Physical Exam:    VS:  BP 97/62   Pulse 60   Temp (!) 97.2 F (36.2 C)   Ht 6\' 2"  (1.88 m)   Wt 150 lb (68 kg)   SpO2 100%   BMI 19.26 kg/m     Wt Readings from Last 3 Encounters:  03/24/19 150 lb (68 kg)  03/21/19 145 lb 6.4 oz (66 kg)  03/10/19 147 lb 2 oz (66.7 kg)    GEN: Well nourished, well developed in no acute distress HEENT: Normal, moist mucous membranes NECK: No JVD CARDIAC: regular rhythm, normal S1 and S2, no murmurs, rubs, gallops.  VASCULAR: Radial and DP pulses 2+ bilaterally. No carotid bruits RESPIRATORY:  Clear to auscultation without rales, wheezing or rhonchi  ABDOMEN: Soft,  non-tender, non-distended MUSCULOSKELETAL:  Ambulates independently SKIN: Warm and dry, no edema NEUROLOGIC:  Alert and oriented x 3. No focal neuro deficits noted. PSYCHIATRIC:  Normal affect    ASSESSMENT:    1. Paroxysmal atrial fibrillation (HCC)   2. SOB (shortness of breath)   3. Pure hypercholesterolemia   4. Stage III squamous cell carcinoma of right lung ([place])   5. History of pulmonary embolism   6. Cardiac risk counseling   7. Counseling on health promotion and disease prevention    PLAN:    Shortness of breath: unclear if this is residual from his PE, if he now has elevated PA pressures or RV dysfunction. Lung cancer with stage II squamous cell carcinoma complicates his symptom evaluation as well. -echocardiogram for further evaluation; prior had RVSP 70 mmHg during acute PE. Would be helpful to see if this remains elevated vs improved with treatment.  Atrial fibrillation with RVR in the  hospital, now in sinus rhythm -decrease amiodarone to 200 mg daily (from BID) -continue diltiazem 120 mg daily for rate control -CHA2DS2/VAS Stroke Risk Points=3 -continue anticoagulation with rivaroxaban  Cardiac risk counseling and prevention recommendations: -recommend heart healthy/Mediterranean diet, with whole grains, fruits, vegetable, fish, lean meats, nuts, and olive oil. Limit salt. -recommend moderate walking, 3-5 times/week for 30-50 minutes each session. Aim for at least 150 minutes.week. Goal should be pace of 3 miles/hours, or walking 1.5 miles in 30 minutes -recommend avoidance of tobacco products. Avoid excess alcohol. -Additional risk factor control:  -Diabetes: A1c is not available, denies history  -Lipids: 10/2016 Tchol 252, HDL 62, LDL 152, TG 188. Pure hypercholesterolemia based on LDL. Will recheck panel, but given comorbid conditions unlikely to start statin.  -Blood pressure control: runs hypotensive, on midodrine  -Weight: BMI 19  Plan for follow up: 3 mos   Medication Adjustments/Labs and Tests Ordered: Current medicines are reviewed at length with the patient today.  Concerns regarding medicines are outlined above.  Orders Placed This Encounter  Procedures  . Lipid panel  . EKG 12-Lead  . ECHOCARDIOGRAM COMPLETE   Meds ordered this encounter  Medications  . amiodarone (PACERONE) 200 MG tablet    Sig: Take 1 tablet (200 mg total) by mouth daily.    Dispense:  60 tablet    Refill:  0    Patient Instructions  Medication Instructions:  Decrease Amiodarone 200 mg to once a day.   If you need a refill on your cardiac medications before your next appointment, please call your pharmacy.   Lab work: Your physician recommends that you return for lab work (Lipid)   Testing/Procedures: Your physician has requested that you have an echocardiogram. Echocardiography is a painless test that uses sound waves to create images of your heart. It provides your doctor with information about the size and shape of your heart and how well your heart's chambers and valves are working. This procedure takes approximately one hour. There are no restrictions for this procedure. [place] [address]     Follow-Up: At [facility], you and your health needs are our priority.  As part of our continuing mission to provide you with exceptional heart care, we have created designated Provider Care Teams.  These Care Teams include your primary Cardiologist (physician) and Advanced Practice Providers (APPs -  Physician Assistants and Nurse Practitioners) who all work together to provide you with the care you need, when you need it. You will need a follow up appointment in 3 months.  Please call our office 2 months in advance to schedule this appointment.  You may see Dr. Harrell Gave or one of the following Advanced Practice Providers on your designated Care Team:   Rosaria Ferries, PA-C . Jory Sims, DNP, ANP       Signed, Buford Dresser, MD PhD 03/24/2019 11:21 PM    [place]

## 2019-03-24 NOTE — Patient Instructions (Addendum)
Medication Instructions:  Decrease Amiodarone 200 mg to once a day.   If you need a refill on your cardiac medications before your next appointment, please call your pharmacy.   Lab work: Your physician recommends that you return for lab work (Lipid)   Testing/Procedures: Your physician has requested that you have an echocardiogram. Echocardiography is a painless test that uses sound waves to create images of your heart. It provides your doctor with information about the size and shape of your heart and how well your heart's chambers and valves are working. This procedure takes approximately one hour. There are no restrictions for this procedure. [place] [address]     Follow-Up: At [facility], you and your health needs are our priority.  As part of our continuing mission to provide you with exceptional heart care, we have created designated Provider Care Teams.  These Care Teams include your primary Cardiologist (physician) and Advanced Practice Providers (APPs -  Physician Assistants and Nurse Practitioners) who all work together to provide you with the care you need, when you need it. You will need a follow up appointment in 3 months.  Please call our office 2 months in advance to schedule this appointment.  You may see Dr. Harrell Gave or one of the following Advanced Practice Providers on your designated Care Team:   Rosaria Ferries, PA-C . Jory Sims, DNP, ANP

## 2019-03-24 NOTE — Telephone Encounter (Signed)
Neil Crouch spoke with patient's daughter and she will call back to schedule Echo ordered by Dr. Harrell Gave

## 2019-03-25 ENCOUNTER — Other Ambulatory Visit: Payer: Self-pay

## 2019-03-25 ENCOUNTER — Ambulatory Visit
Admission: RE | Admit: 2019-03-25 | Discharge: 2019-03-25 | Disposition: A | Discharge status: Home / Self Care | Payer: Medicare HMO | Source: Ambulatory Visit | Attending: Radiation Oncology | Admitting: Radiation Oncology

## 2019-03-25 DIAGNOSIS — C342 Malignant neoplasm of middle lobe, bronchus or lung: Secondary | ICD-10-CM | POA: Diagnosis not present

## 2019-03-28 ENCOUNTER — Ambulatory Visit: Payer: Medicare HMO | Admitting: Physician Assistant

## 2019-03-28 ENCOUNTER — Ambulatory Visit
Admission: RE | Admit: 2019-03-28 | Discharge: 2019-03-28 | Disposition: A | Discharge status: Home / Self Care | Payer: Medicare HMO | Source: Ambulatory Visit | Attending: Radiation Oncology | Admitting: Radiation Oncology

## 2019-03-28 ENCOUNTER — Other Ambulatory Visit: Payer: Medicare HMO

## 2019-03-28 ENCOUNTER — Inpatient Hospital Stay: Payer: Medicare HMO

## 2019-03-28 ENCOUNTER — Other Ambulatory Visit: Payer: Self-pay

## 2019-03-28 ENCOUNTER — Telehealth: Payer: Self-pay | Admitting: Medical Oncology

## 2019-03-28 VITALS — BP 125/68 | HR 93 | Temp 98.2°F | Resp 18

## 2019-03-28 DIAGNOSIS — C342 Malignant neoplasm of middle lobe, bronchus or lung: Secondary | ICD-10-CM | POA: Diagnosis not present

## 2019-03-28 DIAGNOSIS — Z86711 Personal history of pulmonary embolism: Secondary | ICD-10-CM | POA: Diagnosis not present

## 2019-03-28 DIAGNOSIS — Z5111 Encounter for antineoplastic chemotherapy: Secondary | ICD-10-CM | POA: Diagnosis not present

## 2019-03-28 DIAGNOSIS — Z7901 Long term (current) use of anticoagulants: Secondary | ICD-10-CM | POA: Diagnosis not present

## 2019-03-28 DIAGNOSIS — I959 Hypotension, unspecified: Secondary | ICD-10-CM | POA: Diagnosis not present

## 2019-03-28 DIAGNOSIS — I10 Essential (primary) hypertension: Secondary | ICD-10-CM | POA: Diagnosis not present

## 2019-03-28 DIAGNOSIS — C3491 Malignant neoplasm of unspecified part of right bronchus or lung: Secondary | ICD-10-CM

## 2019-03-28 DIAGNOSIS — Z79899 Other long term (current) drug therapy: Secondary | ICD-10-CM | POA: Diagnosis not present

## 2019-03-28 DIAGNOSIS — E7800 Pure hypercholesterolemia, unspecified: Secondary | ICD-10-CM | POA: Diagnosis not present

## 2019-03-28 DIAGNOSIS — R69 Illness, unspecified: Secondary | ICD-10-CM | POA: Diagnosis not present

## 2019-03-28 LAB — CBC WITH DIFFERENTIAL (CANCER CENTER ONLY)
Abs Immature Granulocytes: 0.15 10*3/uL — ABNORMAL HIGH (ref 0.00–0.07)
Basophils Absolute: 0 10*3/uL (ref 0.0–0.1)
Basophils Relative: 0 %
Eosinophils Absolute: 0 10*3/uL (ref 0.0–0.5)
Eosinophils Relative: 0 %
HCT: 30.3 % — ABNORMAL LOW (ref 39.0–52.0)
Hemoglobin: 9.9 g/dL — ABNORMAL LOW (ref 13.0–17.0)
Immature Granulocytes: 2 %
Lymphocytes Relative: 16 %
Lymphs Abs: 1.5 10*3/uL (ref 0.7–4.0)
MCH: 20.1 pg — ABNORMAL LOW (ref 26.0–34.0)
MCHC: 32.7 g/dL (ref 30.0–36.0)
MCV: 61.5 fL — ABNORMAL LOW (ref 80.0–100.0)
Monocytes Absolute: 0.5 10*3/uL (ref 0.1–1.0)
Monocytes Relative: 5 %
Neutro Abs: 7.1 10*3/uL (ref 1.7–7.7)
Neutrophils Relative %: 77 %
Platelet Count: 531 10*3/uL — ABNORMAL HIGH (ref 150–400)
RBC: 4.93 MIL/uL (ref 4.22–5.81)
RDW: 22.4 % — ABNORMAL HIGH (ref 11.5–15.5)
WBC Count: 9.3 10*3/uL (ref 4.0–10.5)
nRBC: 0.4 % — ABNORMAL HIGH (ref 0.0–0.2)

## 2019-03-28 LAB — CMP (CANCER CENTER ONLY)
ALT: 11 U/L (ref 0–44)
AST: 12 U/L — ABNORMAL LOW (ref 15–41)
Albumin: 2.9 g/dL — ABNORMAL LOW (ref 3.5–5.0)
Alkaline Phosphatase: 88 U/L (ref 38–126)
Anion gap: 10 (ref 5–15)
BUN: 12 mg/dL (ref 8–23)
CO2: 26 mmol/L (ref 22–32)
Calcium: 10 mg/dL (ref 8.9–10.3)
Chloride: 101 mmol/L (ref 98–111)
Creatinine: 0.75 mg/dL (ref 0.61–1.24)
GFR, Est AFR Am: >60 mL/min (ref >60)
GFR, Estimated: >60 mL/min (ref >60)
Glucose, Bld: 142 mg/dL — ABNORMAL HIGH (ref 70–99)
Potassium: 4.3 mmol/L (ref 3.5–5.1)
Sodium: 137 mmol/L (ref 135–145)
Total Bilirubin: 0.4 mg/dL (ref 0.3–1.2)
Total Protein: 7.2 g/dL (ref 6.5–8.1)

## 2019-03-28 MED ORDER — DIPHENHYDRAMINE HCL 50 MG/ML IJ SOLN
50.0000 mg | Freq: Once | INTRAMUSCULAR | Status: AC
  Administered 2019-03-28: 50 mg via INTRAVENOUS

## 2019-03-28 MED ORDER — FAMOTIDINE IN NACL 20-0.9 MG/50ML-% IV SOLN
20.0000 mg | Freq: Once | INTRAVENOUS | Status: AC
  Administered 2019-03-28: 20 mg via INTRAVENOUS

## 2019-03-28 MED ORDER — PALONOSETRON HCL INJECTION 0.25 MG/5ML
0.2500 mg | Freq: Once | INTRAVENOUS | Status: AC
  Administered 2019-03-28: 0.25 mg via INTRAVENOUS

## 2019-03-28 MED ORDER — SODIUM CHLORIDE 0.9 % IV SOLN
170.0000 mg | Freq: Once | INTRAVENOUS | Status: AC
  Administered 2019-03-28: 170 mg via INTRAVENOUS
  Filled 2019-03-28: qty 17

## 2019-03-28 MED ORDER — FAMOTIDINE IN NACL 20-0.9 MG/50ML-% IV SOLN
INTRAVENOUS | Status: AC
  Filled 2019-03-28: qty 50

## 2019-03-28 MED ORDER — SODIUM CHLORIDE 0.9 % IV SOLN
Freq: Once | INTRAVENOUS | Status: AC
  Administered 2019-03-28: 12:00:00 via INTRAVENOUS
  Filled 2019-03-28: qty 250

## 2019-03-28 MED ORDER — PALONOSETRON HCL INJECTION 0.25 MG/5ML
INTRAVENOUS | Status: AC
  Filled 2019-03-28: qty 5

## 2019-03-28 MED ORDER — DIPHENHYDRAMINE HCL 50 MG/ML IJ SOLN
INTRAMUSCULAR | Status: AC
  Filled 2019-03-28: qty 1

## 2019-03-28 MED ORDER — SODIUM CHLORIDE 0.9 % IV SOLN
20.0000 mg | Freq: Once | INTRAVENOUS | Status: AC
  Administered 2019-03-28: 20 mg via INTRAVENOUS
  Filled 2019-03-28: qty 2

## 2019-03-28 MED ORDER — SODIUM CHLORIDE 0.9 % IV SOLN
45.0000 mg/m2 | Freq: Once | INTRAVENOUS | Status: AC
  Administered 2019-03-28: 84 mg via INTRAVENOUS
  Filled 2019-03-28: qty 14

## 2019-03-28 NOTE — Progress Notes (Signed)
Home Health referral-Called Edwinna Areola for [facility] referral. She will investigate pt insurance and call me back.

## 2019-03-28 NOTE — Telephone Encounter (Signed)
Family requesting home health evaluation and treatment for pt undergoing chemotherapy. Needs PCS and equipment.  Referral for [facility] home health. On Mohamed's desk.  [facility]- not enough staff to see pt. [facility]- same

## 2019-03-29 ENCOUNTER — Ambulatory Visit
Admission: RE | Admit: 2019-03-29 | Discharge: 2019-03-29 | Disposition: A | Discharge status: Home / Self Care | Payer: Medicare HMO | Source: Ambulatory Visit | Attending: Radiation Oncology | Admitting: Radiation Oncology

## 2019-03-29 ENCOUNTER — Other Ambulatory Visit: Payer: Self-pay

## 2019-03-29 DIAGNOSIS — C342 Malignant neoplasm of middle lobe, bronchus or lung: Secondary | ICD-10-CM | POA: Diagnosis not present

## 2019-03-30 ENCOUNTER — Encounter: Payer: Self-pay | Admitting: Cardiology

## 2019-03-30 ENCOUNTER — Ambulatory Visit
Admission: RE | Admit: 2019-03-30 | Discharge: 2019-03-30 | Disposition: A | Discharge status: Home / Self Care | Payer: Medicare HMO | Source: Ambulatory Visit | Attending: Radiation Oncology | Admitting: Radiation Oncology

## 2019-03-30 ENCOUNTER — Telehealth: Payer: Self-pay | Admitting: Medical Oncology

## 2019-03-30 ENCOUNTER — Other Ambulatory Visit: Payer: Self-pay

## 2019-03-30 DIAGNOSIS — R0602 Shortness of breath: Secondary | ICD-10-CM | POA: Insufficient documentation

## 2019-03-30 DIAGNOSIS — Z86711 Personal history of pulmonary embolism: Secondary | ICD-10-CM | POA: Insufficient documentation

## 2019-03-30 DIAGNOSIS — C342 Malignant neoplasm of middle lobe, bronchus or lung: Secondary | ICD-10-CM | POA: Diagnosis not present

## 2019-03-30 DIAGNOSIS — E7800 Pure hypercholesterolemia, unspecified: Secondary | ICD-10-CM | POA: Insufficient documentation

## 2019-03-30 NOTE — Telephone Encounter (Signed)
[facility] is out of network,but [facility] can see pt. [facility] has received the referral  information from [place] and she will run all insurance and contact pt.

## 2019-03-31 ENCOUNTER — Other Ambulatory Visit: Payer: Self-pay | Admitting: Radiation Oncology

## 2019-03-31 ENCOUNTER — Ambulatory Visit
Admission: RE | Admit: 2019-03-31 | Discharge: 2019-03-31 | Disposition: A | Discharge status: Home / Self Care | Payer: Medicare HMO | Source: Ambulatory Visit | Attending: Radiation Oncology | Admitting: Radiation Oncology

## 2019-03-31 ENCOUNTER — Encounter: Payer: Self-pay | Admitting: Nutrition

## 2019-03-31 ENCOUNTER — Other Ambulatory Visit: Payer: Self-pay

## 2019-03-31 DIAGNOSIS — C342 Malignant neoplasm of middle lobe, bronchus or lung: Secondary | ICD-10-CM | POA: Diagnosis not present

## 2019-03-31 MED ORDER — DOXYCYCLINE HYCLATE 100 MG PO TABS: 100.0000 mg | ORAL_TABLET | Freq: Two times a day (BID) | ORAL | 0 refills | Status: DC

## 2019-03-31 NOTE — Progress Notes (Signed)
Patient was given a complementary case of Ensure Enlive.

## 2019-04-01 ENCOUNTER — Other Ambulatory Visit: Payer: Self-pay | Admitting: Physician Assistant

## 2019-04-01 ENCOUNTER — Other Ambulatory Visit: Payer: Self-pay

## 2019-04-01 ENCOUNTER — Ambulatory Visit
Admission: RE | Admit: 2019-04-01 | Discharge: 2019-04-01 | Disposition: A | Discharge status: Home / Self Care | Payer: Medicare HMO | Source: Ambulatory Visit | Attending: Radiation Oncology | Admitting: Radiation Oncology

## 2019-04-01 DIAGNOSIS — C3491 Malignant neoplasm of unspecified part of right bronchus or lung: Secondary | ICD-10-CM

## 2019-04-01 DIAGNOSIS — C342 Malignant neoplasm of middle lobe, bronchus or lung: Secondary | ICD-10-CM | POA: Diagnosis not present

## 2019-04-04 ENCOUNTER — Inpatient Hospital Stay (HOSPITAL_BASED_OUTPATIENT_CLINIC_OR_DEPARTMENT_OTHER): Payer: Medicare HMO | Admitting: Internal Medicine

## 2019-04-04 ENCOUNTER — Inpatient Hospital Stay: Payer: Medicare HMO

## 2019-04-04 ENCOUNTER — Ambulatory Visit
Admission: RE | Admit: 2019-04-04 | Discharge: 2019-04-04 | Disposition: A | Discharge status: Home / Self Care | Payer: Medicare HMO | Source: Ambulatory Visit | Attending: Radiation Oncology | Admitting: Radiation Oncology

## 2019-04-04 ENCOUNTER — Encounter: Payer: Self-pay | Admitting: Internal Medicine

## 2019-04-04 ENCOUNTER — Other Ambulatory Visit: Payer: Self-pay

## 2019-04-04 VITALS — HR 96

## 2019-04-04 VITALS — BP 86/73 | HR 113 | Temp 98.2°F | Resp 18 | Ht 74.0 in | Wt 144.8 lb

## 2019-04-04 DIAGNOSIS — Z5111 Encounter for antineoplastic chemotherapy: Secondary | ICD-10-CM | POA: Diagnosis not present

## 2019-04-04 DIAGNOSIS — I10 Essential (primary) hypertension: Secondary | ICD-10-CM

## 2019-04-04 DIAGNOSIS — E7800 Pure hypercholesterolemia, unspecified: Secondary | ICD-10-CM | POA: Diagnosis not present

## 2019-04-04 DIAGNOSIS — Z7901 Long term (current) use of anticoagulants: Secondary | ICD-10-CM | POA: Diagnosis not present

## 2019-04-04 DIAGNOSIS — I9589 Other hypotension: Secondary | ICD-10-CM | POA: Diagnosis not present

## 2019-04-04 DIAGNOSIS — R69 Illness, unspecified: Secondary | ICD-10-CM | POA: Diagnosis not present

## 2019-04-04 DIAGNOSIS — I959 Hypotension, unspecified: Secondary | ICD-10-CM | POA: Diagnosis not present

## 2019-04-04 DIAGNOSIS — E861 Hypovolemia: Secondary | ICD-10-CM

## 2019-04-04 DIAGNOSIS — Z79899 Other long term (current) drug therapy: Secondary | ICD-10-CM | POA: Diagnosis not present

## 2019-04-04 DIAGNOSIS — C342 Malignant neoplasm of middle lobe, bronchus or lung: Secondary | ICD-10-CM | POA: Diagnosis not present

## 2019-04-04 DIAGNOSIS — Z86711 Personal history of pulmonary embolism: Secondary | ICD-10-CM | POA: Diagnosis not present

## 2019-04-04 DIAGNOSIS — C3491 Malignant neoplasm of unspecified part of right bronchus or lung: Secondary | ICD-10-CM

## 2019-04-04 LAB — CMP (CANCER CENTER ONLY)
ALT: 13 U/L (ref 0–44)
AST: 13 U/L — ABNORMAL LOW (ref 15–41)
Albumin: 3.2 g/dL — ABNORMAL LOW (ref 3.5–5.0)
Alkaline Phosphatase: 104 U/L (ref 38–126)
Anion gap: 14 (ref 5–15)
BUN: 13 mg/dL (ref 8–23)
CO2: 26 mmol/L (ref 22–32)
Calcium: 10.4 mg/dL — ABNORMAL HIGH (ref 8.9–10.3)
Chloride: 103 mmol/L (ref 98–111)
Creatinine: 0.8 mg/dL (ref 0.61–1.24)
GFR, Est AFR Am: >60 mL/min (ref >60)
GFR, Estimated: >60 mL/min (ref >60)
Glucose, Bld: 132 mg/dL — ABNORMAL HIGH (ref 70–99)
Potassium: 4.1 mmol/L (ref 3.5–5.1)
Sodium: 143 mmol/L (ref 135–145)
Total Bilirubin: 0.3 mg/dL (ref 0.3–1.2)
Total Protein: 7.7 g/dL (ref 6.5–8.1)

## 2019-04-04 LAB — CBC WITH DIFFERENTIAL (CANCER CENTER ONLY)
Abs Immature Granulocytes: 0.03 10*3/uL (ref 0.00–0.07)
Basophils Absolute: 0 10*3/uL (ref 0.0–0.1)
Basophils Relative: 0 %
Eosinophils Absolute: 0 10*3/uL (ref 0.0–0.5)
Eosinophils Relative: 0 %
HCT: 34 % — ABNORMAL LOW (ref 39.0–52.0)
Hemoglobin: 10.9 g/dL — ABNORMAL LOW (ref 13.0–17.0)
Immature Granulocytes: 1 %
Lymphocytes Relative: 16 %
Lymphs Abs: 0.7 10*3/uL (ref 0.7–4.0)
MCH: 20 pg — ABNORMAL LOW (ref 26.0–34.0)
MCHC: 32.1 g/dL (ref 30.0–36.0)
MCV: 62.5 fL — ABNORMAL LOW (ref 80.0–100.0)
Monocytes Absolute: 0.3 10*3/uL (ref 0.1–1.0)
Monocytes Relative: 6 %
Neutro Abs: 3.1 10*3/uL (ref 1.7–7.7)
Neutrophils Relative %: 77 %
Platelet Count: 433 10*3/uL — ABNORMAL HIGH (ref 150–400)
RBC: 5.44 MIL/uL (ref 4.22–5.81)
RDW: 23.7 % — ABNORMAL HIGH (ref 11.5–15.5)
WBC Count: 4.1 10*3/uL (ref 4.0–10.5)
nRBC: 1 % — ABNORMAL HIGH (ref 0.0–0.2)

## 2019-04-04 MED ORDER — DIPHENHYDRAMINE HCL 50 MG/ML IJ SOLN
50.0000 mg | Freq: Once | INTRAMUSCULAR | Status: AC
  Administered 2019-04-04: 50 mg via INTRAVENOUS

## 2019-04-04 MED ORDER — SODIUM CHLORIDE 0.9 % IV SOLN
45.0000 mg/m2 | Freq: Once | INTRAVENOUS | Status: AC
  Administered 2019-04-04: 84 mg via INTRAVENOUS
  Filled 2019-04-04: qty 14

## 2019-04-04 MED ORDER — PALONOSETRON HCL INJECTION 0.25 MG/5ML
INTRAVENOUS | Status: AC
  Filled 2019-04-04: qty 5

## 2019-04-04 MED ORDER — SODIUM CHLORIDE 0.9 % IV SOLN
20.0000 mg | Freq: Once | INTRAVENOUS | Status: AC
  Administered 2019-04-04: 20 mg via INTRAVENOUS
  Filled 2019-04-04: qty 20

## 2019-04-04 MED ORDER — SODIUM CHLORIDE 0.9 % IV SOLN
172.8000 mg | Freq: Once | INTRAVENOUS | Status: AC
  Administered 2019-04-04: 170 mg via INTRAVENOUS
  Filled 2019-04-04: qty 17

## 2019-04-04 MED ORDER — FAMOTIDINE IN NACL 20-0.9 MG/50ML-% IV SOLN
20.0000 mg | Freq: Once | INTRAVENOUS | Status: AC
  Administered 2019-04-04: 14:00:00 20 mg via INTRAVENOUS

## 2019-04-04 MED ORDER — FAMOTIDINE IN NACL 20-0.9 MG/50ML-% IV SOLN
INTRAVENOUS | Status: AC
  Filled 2019-04-04: qty 50

## 2019-04-04 MED ORDER — PALONOSETRON HCL INJECTION 0.25 MG/5ML
0.2500 mg | Freq: Once | INTRAVENOUS | Status: AC
  Administered 2019-04-04: 0.25 mg via INTRAVENOUS

## 2019-04-04 MED ORDER — SODIUM CHLORIDE 0.9 % IV SOLN
INTRAVENOUS | Status: AC
  Administered 2019-04-04: 14:00:00 via INTRAVENOUS
  Filled 2019-04-04 (×2): qty 250

## 2019-04-04 MED ORDER — DIPHENHYDRAMINE HCL 50 MG/ML IJ SOLN
INTRAMUSCULAR | Status: AC
  Filled 2019-04-04: qty 1

## 2019-04-04 NOTE — Progress Notes (Signed)
[name] [phone]   Fax:([phone]) [phone]  OFFICE PROGRESS NOTE  [name], NP [address] [id number]  DIAGNOSIS: Stage IIIA(T3, N2, M0) non-small cell lung cancer, squamous cell carcinoma presented with large right middle lobe lung mass with postobstructive pneumonia as well as right hilar and mediastinal lymphadenopathy diagnosed in July 2020. He also has a suspicious small right pleural effusion and small indeterminate metabolic focus in the liver.   PRIOR THERAPY: None  CURRENT THERAPY: Concurrent chemoradiation with Carboplatin for an AUC of 2 and Paclitaxel 45 mg/m2. First dose expected on 01/24/2019.  Status post 5 cycles.  INTERVAL HISTORY: Richard [name] 78 y.o. male returns to the clinic today for follow-up visit accompanied by his daughter.  The patient is feeling well today except for few pounds of weight loss as well as lack of appetite and dehydration.  He denied having any current chest pain, shortness of breath but has mild cough with no hemoptysis.  He denied having any recent fever or chills.  He has no nausea, vomiting, diarrhea or constipation.  He has no headache or visual changes.  He is here today for evaluation before starting cycle #6 of his treatment.  MEDICAL HISTORY: Past Medical History:  Diagnosis Date  . Dementia ([place])   . High cholesterol   . Hypertension   . Lung mass 12/2018    ALLERGIES:  has No Known Allergies.  MEDICATIONS:  Current Outpatient Medications  Medication Sig Dispense Refill  . amiodarone (PACERONE) 200 MG tablet Take 1 tablet (200 mg total) by mouth daily. 60 tablet 0  . atorvastatin (LIPITOR) 20 MG tablet Take 1 tablet (20 mg total) by mouth every evening. (Patient not taking: Reported on 03/24/2019) 90 tablet 1  . diltiazem (CARDIZEM CD) 120 MG 24 hr capsule Take 1 capsule (120 mg total) by mouth daily. 30 capsule 0  . doxycycline (VIBRA-TABS) 100 MG tablet Take 1  tablet (100 mg total) by mouth 2 (two) times daily. 14 tablet 0  . midodrine (PROAMATINE) 5 MG tablet Take 1 tablet (5 mg total) by mouth 2 (two) times daily with a meal. 30 tablet 0  . Omega-3 Fatty Acids (FISH OIL PO) Take 1 capsule by mouth daily.    . prochlorperazine (COMPAZINE) 10 MG tablet Take 1 tablet (10 mg total) by mouth every 6 (six) hours as needed for nausea or vomiting. 30 tablet 0  . rivaroxaban (XARELTO) 20 MG TABS tablet Take 1 tablet (20 mg total) by mouth daily with supper. 30 tablet 0  . tamsulosin (FLOMAX) 0.4 MG CAPS capsule Take 1 capsule (0.4 mg total) by mouth at bedtime. (Patient not taking: Reported on 03/24/2019) 30 capsule 0   No current facility-administered medications for this visit.     SURGICAL HISTORY:  Past Surgical History:  Procedure Laterality Date  . JOINT REPLACEMENT    . VIDEO BRONCHOSCOPY WITH ENDOBRONCHIAL ULTRASOUND N/A 12/16/2018   Procedure: VIDEO BRONCHOSCOPY WITH ENDOBRONCHIAL ULTRASOUND AND FLUROSCOPY;  Surgeon: [name], MD;  Location: [place];  Service: Pulmonary;  Laterality: N/A;    REVIEW OF SYSTEMS:  A comprehensive review of systems was negative except for: Constitutional: positive for anorexia, fatigue and weight loss Respiratory: positive for cough Musculoskeletal: positive for muscle weakness   PHYSICAL EXAMINATION: General appearance: alert, cooperative, fatigued and no distress Head: Normocephalic, without obvious abnormality, atraumatic Neck: no adenopathy, no JVD, supple, symmetrical, trachea midline and thyroid not enlarged, symmetric, no tenderness/mass/nodules Lymph  nodes: Cervical, supraclavicular, and axillary nodes normal. Resp: clear to auscultation bilaterally Back: symmetric, no curvature. ROM normal. No CVA tenderness. Cardio: regular rate and rhythm, S1, S2 normal, no murmur, click, rub or gallop GI: soft, non-tender; bowel sounds normal; no masses,  no organomegaly Extremities: extremities normal,  atraumatic, no cyanosis or edema  ECOG PERFORMANCE STATUS: 1 - Symptomatic but completely ambulatory  Blood pressure (!) 86/73, pulse (!) 113, temperature 98.2 F (36.8 C), temperature source Temporal, resp. rate 18, height 6\' 2"  (1.88 m), weight 144 lb 12.8 oz (65.7 kg), SpO2 100 %.  LABORATORY DATA: Lab Results  Component Value Date   WBC 9.3 03/28/2019   HGB 9.9 (L) 03/28/2019   HCT 30.3 (L) 03/28/2019   MCV 61.5 (L) 03/28/2019   PLT 531 (H) 03/28/2019      Chemistry      Component Value Date/Time   NA 137 03/28/2019 1050   NA 145 (H) 10/29/2016 1513   K 4.3 03/28/2019 1050   CL 101 03/28/2019 1050   CO2 26 03/28/2019 1050   BUN 12 03/28/2019 1050   BUN 12 10/29/2016 1513   CREATININE 0.75 03/28/2019 1050      Component Value Date/Time   CALCIUM 10.0 03/28/2019 1050   ALKPHOS 88 03/28/2019 1050   AST 12 (L) 03/28/2019 1050   ALT 11 03/28/2019 1050   BILITOT 0.4 03/28/2019 1050       RADIOGRAPHIC STUDIES: Ct Head Wo Contrast  Result Date: 03/23/2019 CLINICAL DATA:  Trip and fall EXAM: CT HEAD WITHOUT CONTRAST TECHNIQUE: Contiguous axial images were obtained from the base of the skull through the vertex without intravenous contrast. COMPARISON:  MR brain, 01/04/2019 FINDINGS: Brain: No evidence of acute infarction, hemorrhage, hydrocephalus, extra-axial collection or mass lesion/mass effect. Redemonstrated ventriculomegaly and chronic global volume loss. Vascular: No hyperdense vessel or unexpected calcification. Skull: Normal. Negative for fracture or focal lesion. Sinuses/Orbits: No acute finding. Other: None. IMPRESSION: 1.  No acute intracranial pathology. 2. Redemonstrated ventriculomegaly and chronic global volume loss, unchanged compared to prior examination. Electronically Signed   By: Eddie Candle M.D.   On: 03/23/2019 16:51   Dg Chest Portable 1 View  Result Date: 03/14/2019 CLINICAL DATA:  Pt with onset of weakness and fatigue. Hx of lung cancer. Pt is  currently undergoing chemo and was supposed to start radiation treatment today. EXAM: PORTABLE CHEST 1 VIEW COMPARISON:  Chest CT 02/05/2019, chest x-ray 02/01/2019 FINDINGS: Stable cardiomediastinal contours with normal heart size. Persistent right basilar consolidation previously demonstrated to be total atelectasis of the right middle lobe due to a right hilar mass. No new focal opacity bilaterally. No pneumothorax or definite large effusion. No acute finding in the visualized skeleton. IMPRESSION: 1. Stable chest with persistent right basilar consolidation previously shown to be total atelectasis of the right middle lobe due to a right hilar mass. 2. No new acute findings. Electronically Signed   By: Audie Pinto M.D.   On: 03/14/2019 15:14   Dg Hand Complete Left  Result Date: 03/23/2019 CLINICAL DATA:  Fall.  Left hand pain.  Initial encounter. EXAM: LEFT HAND - COMPLETE 3+ VIEW COMPARISON:  None. FINDINGS: Mild soft swelling is present at the IP joint of the thumb. There is no underlying fracture. Mild degenerative changes are present at the joint. No other acute bone or soft tissue abnormalities are present. The wrist is located. IMPRESSION: 1. Soft tissue swelling at the IP joint of the thumb without acute fracture. 2. Otherwise unremarkable hand  radiographs. Electronically Signed   By: San Morelle M.D.   On: 03/23/2019 16:29    ASSESSMENT AND PLAN: This is a very pleasant 78 years old African-American male with a stage IIIA non-small cell lung cancer, squamous cell carcinoma. The patient started a course of concurrent chemoradiation with weekly carboplatin for AUC of 2 and paclitaxel 45 mg/M2 status post 5 cycles but this treatment was interrupted secondary to recent hospitalization for pneumonia and pulmonary embolism. The patient continues to tolerate this treatment well with no concerning adverse effect except for fatigue and few pounds of weight loss. I recommended for him to  proceed with cycle #6 today as planned. For the hypotension, I will arrange for the patient to receive 1 L of normal saline in the clinic today. I will see him back for follow-up visit in 2 weeks for evaluation before starting cycle #8. He was advised to call immediately if he has any concerning symptoms in the interval. The patient was advised to call immediately if he has any concerning symptoms in the interval. The patient voices understanding of current disease status and treatment options and is in agreement with the current care plan.  All questions were answered. The patient knows to call the clinic with any problems, questions or concerns. We can certainly see the patient much sooner if necessary.  I spent 10 minutes counseling the patient face to face. The total time spent in the appointment was 15 minutes.  Disclaimer: This note was dictated with voice recognition software. Similar sounding words can inadvertently be transcribed and may not be corrected upon review.

## 2019-04-04 NOTE — Patient Instructions (Signed)
[facility] Discharge Instructions for Patients Receiving Chemotherapy  Today you received the following chemotherapy agents:  Taxol, Carboplatin  To help prevent nausea and vomiting after your treatment, we encourage you to take your nausea medication as prescribed.   If you develop nausea and vomiting that is not controlled by your nausea medication, call the clinic.   BELOW ARE SYMPTOMS THAT SHOULD BE REPORTED IMMEDIATELY:  *FEVER GREATER THAN 100.5 F  *CHILLS WITH OR WITHOUT FEVER  NAUSEA AND VOMITING THAT IS NOT CONTROLLED WITH YOUR NAUSEA MEDICATION  *UNUSUAL SHORTNESS OF BREATH  *UNUSUAL BRUISING OR BLEEDING  TENDERNESS IN MOUTH AND THROAT WITH OR WITHOUT PRESENCE OF ULCERS  *URINARY PROBLEMS  *BOWEL PROBLEMS  UNUSUAL RASH Items with * indicate a potential emergency and should be followed up as soon as possible.  Feel free to call the clinic should you have any questions or concerns. The clinic phone number is [phone].  Please show the CHEMO ALERT CARD at check-in to the Emergency Department and triage nurse.   

## 2019-04-05 ENCOUNTER — Other Ambulatory Visit: Payer: Self-pay

## 2019-04-05 ENCOUNTER — Encounter: Payer: Self-pay | Admitting: Internal Medicine

## 2019-04-05 ENCOUNTER — Ambulatory Visit
Admission: RE | Admit: 2019-04-05 | Discharge: 2019-04-05 | Disposition: A | Discharge status: Home / Self Care | Payer: Medicare HMO | Source: Ambulatory Visit | Attending: Radiation Oncology | Admitting: Radiation Oncology

## 2019-04-05 DIAGNOSIS — C342 Malignant neoplasm of middle lobe, bronchus or lung: Secondary | ICD-10-CM | POA: Diagnosis not present

## 2019-04-05 NOTE — Progress Notes (Signed)
Met with patient and caretaker to have them complete grant paperwork.  Patient approved for one-time $700 [employer] to assist with personal expenses while going through treatment. Gave them a copy of the approval letter and expense sheet as well as the Outpatient pharmacy information. Gas card was received today from grant.  My card was given for any additional financial questions or concerns.

## 2019-04-06 ENCOUNTER — Other Ambulatory Visit: Payer: Self-pay

## 2019-04-06 ENCOUNTER — Ambulatory Visit
Admission: RE | Admit: 2019-04-06 | Discharge: 2019-04-06 | Disposition: A | Discharge status: Home / Self Care | Payer: Medicare HMO | Source: Ambulatory Visit | Attending: Radiation Oncology | Admitting: Radiation Oncology

## 2019-04-06 DIAGNOSIS — C342 Malignant neoplasm of middle lobe, bronchus or lung: Secondary | ICD-10-CM | POA: Diagnosis not present

## 2019-04-07 ENCOUNTER — Ambulatory Visit: Payer: Medicare HMO

## 2019-04-07 DIAGNOSIS — W19XXXA Unspecified fall, initial encounter: Secondary | ICD-10-CM | POA: Diagnosis not present

## 2019-04-07 DIAGNOSIS — I959 Hypotension, unspecified: Secondary | ICD-10-CM | POA: Diagnosis not present

## 2019-04-07 DIAGNOSIS — R55 Syncope and collapse: Secondary | ICD-10-CM | POA: Diagnosis not present

## 2019-04-08 ENCOUNTER — Ambulatory Visit: Admission: RE | Admit: 2019-04-08 | Payer: Medicare HMO | Source: Ambulatory Visit

## 2019-04-08 ENCOUNTER — Ambulatory Visit: Payer: Medicare HMO

## 2019-04-11 ENCOUNTER — Inpatient Hospital Stay: Payer: Medicare HMO

## 2019-04-11 ENCOUNTER — Other Ambulatory Visit: Payer: Self-pay

## 2019-04-11 ENCOUNTER — Encounter: Payer: Self-pay | Admitting: *Deleted

## 2019-04-11 ENCOUNTER — Ambulatory Visit
Admission: RE | Admit: 2019-04-11 | Discharge: 2019-04-11 | Disposition: A | Discharge status: Home / Self Care | Payer: Medicare HMO | Source: Ambulatory Visit | Attending: Radiation Oncology | Admitting: Radiation Oncology

## 2019-04-11 VITALS — BP 109/62 | HR 99 | Temp 98.2°F | Resp 18

## 2019-04-11 DIAGNOSIS — C342 Malignant neoplasm of middle lobe, bronchus or lung: Secondary | ICD-10-CM | POA: Diagnosis not present

## 2019-04-11 DIAGNOSIS — I959 Hypotension, unspecified: Secondary | ICD-10-CM | POA: Diagnosis not present

## 2019-04-11 DIAGNOSIS — R69 Illness, unspecified: Secondary | ICD-10-CM | POA: Diagnosis not present

## 2019-04-11 DIAGNOSIS — Z79899 Other long term (current) drug therapy: Secondary | ICD-10-CM | POA: Diagnosis not present

## 2019-04-11 DIAGNOSIS — Z7901 Long term (current) use of anticoagulants: Secondary | ICD-10-CM | POA: Diagnosis not present

## 2019-04-11 DIAGNOSIS — C3491 Malignant neoplasm of unspecified part of right bronchus or lung: Secondary | ICD-10-CM

## 2019-04-11 DIAGNOSIS — E7800 Pure hypercholesterolemia, unspecified: Secondary | ICD-10-CM | POA: Diagnosis not present

## 2019-04-11 DIAGNOSIS — Z86711 Personal history of pulmonary embolism: Secondary | ICD-10-CM | POA: Diagnosis not present

## 2019-04-11 DIAGNOSIS — Z5111 Encounter for antineoplastic chemotherapy: Secondary | ICD-10-CM | POA: Diagnosis not present

## 2019-04-11 DIAGNOSIS — I10 Essential (primary) hypertension: Secondary | ICD-10-CM | POA: Diagnosis not present

## 2019-04-11 LAB — CMP (CANCER CENTER ONLY)
ALT: 16 U/L (ref 0–44)
AST: 15 U/L (ref 15–41)
Albumin: 3.3 g/dL — ABNORMAL LOW (ref 3.5–5.0)
Alkaline Phosphatase: 96 U/L (ref 38–126)
Anion gap: 13 (ref 5–15)
BUN: 13 mg/dL (ref 8–23)
CO2: 25 mmol/L (ref 22–32)
Calcium: 10.1 mg/dL (ref 8.9–10.3)
Chloride: 104 mmol/L (ref 98–111)
Creatinine: 0.78 mg/dL (ref 0.61–1.24)
GFR, Est AFR Am: >60 mL/min (ref >60)
GFR, Estimated: >60 mL/min (ref >60)
Glucose, Bld: 122 mg/dL — ABNORMAL HIGH (ref 70–99)
Potassium: 4.1 mmol/L (ref 3.5–5.1)
Sodium: 142 mmol/L (ref 135–145)
Total Bilirubin: 0.3 mg/dL (ref 0.3–1.2)
Total Protein: 7.3 g/dL (ref 6.5–8.1)

## 2019-04-11 LAB — CBC WITH DIFFERENTIAL (CANCER CENTER ONLY)
Abs Immature Granulocytes: 0.04 10*3/uL (ref 0.00–0.07)
Basophils Absolute: 0 10*3/uL (ref 0.0–0.1)
Basophils Relative: 1 %
Eosinophils Absolute: 0 10*3/uL (ref 0.0–0.5)
Eosinophils Relative: 1 %
HCT: 34.7 % — ABNORMAL LOW (ref 39.0–52.0)
Hemoglobin: 11.1 g/dL — ABNORMAL LOW (ref 13.0–17.0)
Immature Granulocytes: 1 %
Lymphocytes Relative: 16 %
Lymphs Abs: 0.6 10*3/uL — ABNORMAL LOW (ref 0.7–4.0)
MCH: 20 pg — ABNORMAL LOW (ref 26.0–34.0)
MCHC: 32 g/dL (ref 30.0–36.0)
MCV: 62.5 fL — ABNORMAL LOW (ref 80.0–100.0)
Monocytes Absolute: 0.2 10*3/uL (ref 0.1–1.0)
Monocytes Relative: 5 %
Neutro Abs: 3.1 10*3/uL (ref 1.7–7.7)
Neutrophils Relative %: 76 %
Platelet Count: 376 10*3/uL (ref 150–400)
RBC: 5.55 MIL/uL (ref 4.22–5.81)
RDW: 23.8 % — ABNORMAL HIGH (ref 11.5–15.5)
WBC Count: 4 10*3/uL (ref 4.0–10.5)
nRBC: 1.2 % — ABNORMAL HIGH (ref 0.0–0.2)

## 2019-04-11 MED ORDER — SODIUM CHLORIDE 0.9 % IV SOLN
172.8000 mg | Freq: Once | INTRAVENOUS | Status: AC
  Administered 2019-04-11: 170 mg via INTRAVENOUS
  Filled 2019-04-11: qty 17

## 2019-04-11 MED ORDER — PALONOSETRON HCL INJECTION 0.25 MG/5ML
0.2500 mg | Freq: Once | INTRAVENOUS | Status: AC
  Administered 2019-04-11: 0.25 mg via INTRAVENOUS

## 2019-04-11 MED ORDER — SODIUM CHLORIDE 0.9 % IV SOLN
45.0000 mg/m2 | Freq: Once | INTRAVENOUS | Status: AC
  Administered 2019-04-11: 84 mg via INTRAVENOUS
  Filled 2019-04-11: qty 14

## 2019-04-11 MED ORDER — FAMOTIDINE IN NACL 20-0.9 MG/50ML-% IV SOLN
20.0000 mg | Freq: Once | INTRAVENOUS | Status: AC
  Administered 2019-04-11: 20 mg via INTRAVENOUS

## 2019-04-11 MED ORDER — SODIUM CHLORIDE 0.9 % IV SOLN
20.0000 mg | Freq: Once | INTRAVENOUS | Status: AC
  Administered 2019-04-11: 20 mg via INTRAVENOUS
  Filled 2019-04-11: qty 20

## 2019-04-11 MED ORDER — SODIUM CHLORIDE 0.9 % IV SOLN
Freq: Once | INTRAVENOUS | Status: DC
  Filled 2019-04-11: qty 250

## 2019-04-11 MED ORDER — DIPHENHYDRAMINE HCL 50 MG/ML IJ SOLN
50.0000 mg | Freq: Once | INTRAMUSCULAR | Status: AC
  Administered 2019-04-11: 50 mg via INTRAVENOUS

## 2019-04-11 NOTE — Progress Notes (Signed)
Oncology Nurse Navigator Documentation  Oncology Nurse Navigator Flowsheets 04/11/2019  Diagnosis Status -  Phase of Treatment Chemo/Radiation Concurrent  Navigator Follow Up Date: 04/18/2019  Navigator Follow Up Reason: Follow-up Appointment  Navigator Location [facility]  Referral Date to RadOnc/MedOnc -  Navigator Encounter Type Other/I followed up on Richard [name]'s schedule. His treatment plan and appts are up to date as of today.   Abnormal Finding Date -  Confirmed Diagnosis Date -  Treatment Initiated Date 03/16/2019  Patient Visit Type -  Treatment Phase Follow-up  Barriers/Navigation Needs Coordination of Care  Education -  Interventions Coordination of Care  Coordination of Care Other  Education Method -  Acuity Level 2-Minimal Needs (1-2 Barriers Identified)  Time Spent with Patient 30

## 2019-04-11 NOTE — Patient Instructions (Signed)
[facility] Discharge Instructions for Patients Receiving Chemotherapy  Today you received the following chemotherapy agents:  Taxol, Carboplatin  To help prevent nausea and vomiting after your treatment, we encourage you to take your nausea medication as prescribed.   If you develop nausea and vomiting that is not controlled by your nausea medication, call the clinic.   BELOW ARE SYMPTOMS THAT SHOULD BE REPORTED IMMEDIATELY:  *FEVER GREATER THAN 100.5 F  *CHILLS WITH OR WITHOUT FEVER  NAUSEA AND VOMITING THAT IS NOT CONTROLLED WITH YOUR NAUSEA MEDICATION  *UNUSUAL SHORTNESS OF BREATH  *UNUSUAL BRUISING OR BLEEDING  TENDERNESS IN MOUTH AND THROAT WITH OR WITHOUT PRESENCE OF ULCERS  *URINARY PROBLEMS  *BOWEL PROBLEMS  UNUSUAL RASH Items with * indicate a potential emergency and should be followed up as soon as possible.  Feel free to call the clinic should you have any questions or concerns. The clinic phone number is [phone].  Please show the CHEMO ALERT CARD at check-in to the Emergency Department and triage nurse.   

## 2019-04-12 ENCOUNTER — Ambulatory Visit
Admission: RE | Admit: 2019-04-12 | Discharge: 2019-04-12 | Disposition: A | Discharge status: Home / Self Care | Payer: Medicare HMO | Source: Ambulatory Visit | Attending: Radiation Oncology | Admitting: Radiation Oncology

## 2019-04-12 ENCOUNTER — Other Ambulatory Visit: Payer: Self-pay

## 2019-04-12 DIAGNOSIS — C342 Malignant neoplasm of middle lobe, bronchus or lung: Secondary | ICD-10-CM | POA: Diagnosis not present

## 2019-04-13 ENCOUNTER — Ambulatory Visit: Payer: Medicare HMO

## 2019-04-14 ENCOUNTER — Ambulatory Visit: Payer: Medicare HMO

## 2019-04-15 ENCOUNTER — Other Ambulatory Visit: Payer: Self-pay

## 2019-04-15 ENCOUNTER — Ambulatory Visit
Admission: RE | Admit: 2019-04-15 | Discharge: 2019-04-15 | Disposition: A | Discharge status: Home / Self Care | Payer: Medicare HMO | Source: Ambulatory Visit | Attending: Radiation Oncology | Admitting: Radiation Oncology

## 2019-04-15 DIAGNOSIS — C342 Malignant neoplasm of middle lobe, bronchus or lung: Secondary | ICD-10-CM | POA: Diagnosis not present

## 2019-04-18 ENCOUNTER — Ambulatory Visit
Admission: RE | Admit: 2019-04-18 | Discharge: 2019-04-18 | Disposition: A | Discharge status: Home / Self Care | Payer: Medicare HMO | Source: Ambulatory Visit | Attending: Radiation Oncology | Admitting: Radiation Oncology

## 2019-04-18 ENCOUNTER — Other Ambulatory Visit: Payer: Self-pay

## 2019-04-18 ENCOUNTER — Inpatient Hospital Stay (HOSPITAL_BASED_OUTPATIENT_CLINIC_OR_DEPARTMENT_OTHER): Payer: Medicare HMO | Admitting: Internal Medicine

## 2019-04-18 ENCOUNTER — Other Ambulatory Visit: Payer: Self-pay | Admitting: Medical Oncology

## 2019-04-18 ENCOUNTER — Inpatient Hospital Stay: Payer: Medicare HMO

## 2019-04-18 ENCOUNTER — Inpatient Hospital Stay: Payer: Medicare HMO | Attending: Internal Medicine

## 2019-04-18 ENCOUNTER — Encounter: Payer: Self-pay | Admitting: Nutrition

## 2019-04-18 ENCOUNTER — Encounter: Payer: Self-pay | Admitting: Internal Medicine

## 2019-04-18 VITALS — BP 101/62 | HR 95 | Temp 98.0°F | Resp 17 | Ht 74.0 in | Wt 146.4 lb

## 2019-04-18 DIAGNOSIS — F0390 Unspecified dementia without behavioral disturbance: Secondary | ICD-10-CM | POA: Insufficient documentation

## 2019-04-18 DIAGNOSIS — Z23 Encounter for immunization: Secondary | ICD-10-CM | POA: Diagnosis not present

## 2019-04-18 DIAGNOSIS — Z7901 Long term (current) use of anticoagulants: Secondary | ICD-10-CM | POA: Diagnosis not present

## 2019-04-18 DIAGNOSIS — C342 Malignant neoplasm of middle lobe, bronchus or lung: Secondary | ICD-10-CM | POA: Insufficient documentation

## 2019-04-18 DIAGNOSIS — Z8701 Personal history of pneumonia (recurrent): Secondary | ICD-10-CM | POA: Insufficient documentation

## 2019-04-18 DIAGNOSIS — I10 Essential (primary) hypertension: Secondary | ICD-10-CM

## 2019-04-18 DIAGNOSIS — E7800 Pure hypercholesterolemia, unspecified: Secondary | ICD-10-CM | POA: Insufficient documentation

## 2019-04-18 DIAGNOSIS — C3491 Malignant neoplasm of unspecified part of right bronchus or lung: Secondary | ICD-10-CM

## 2019-04-18 DIAGNOSIS — E861 Hypovolemia: Secondary | ICD-10-CM

## 2019-04-18 DIAGNOSIS — R69 Illness, unspecified: Secondary | ICD-10-CM | POA: Diagnosis not present

## 2019-04-18 DIAGNOSIS — Z79899 Other long term (current) drug therapy: Secondary | ICD-10-CM | POA: Insufficient documentation

## 2019-04-18 DIAGNOSIS — Z86711 Personal history of pulmonary embolism: Secondary | ICD-10-CM | POA: Insufficient documentation

## 2019-04-18 DIAGNOSIS — I9589 Other hypotension: Secondary | ICD-10-CM

## 2019-04-18 DIAGNOSIS — Z5111 Encounter for antineoplastic chemotherapy: Secondary | ICD-10-CM | POA: Insufficient documentation

## 2019-04-18 LAB — CMP (CANCER CENTER ONLY)
ALT: 17 U/L (ref 0–44)
AST: 15 U/L (ref 15–41)
Albumin: 3.1 g/dL — ABNORMAL LOW (ref 3.5–5.0)
Alkaline Phosphatase: 99 U/L (ref 38–126)
Anion gap: 11 (ref 5–15)
BUN: 10 mg/dL (ref 8–23)
CO2: 26 mmol/L (ref 22–32)
Calcium: 9.7 mg/dL (ref 8.9–10.3)
Chloride: 104 mmol/L (ref 98–111)
Creatinine: 0.73 mg/dL (ref 0.61–1.24)
GFR, Est AFR Am: >60 mL/min (ref >60)
GFR, Estimated: >60 mL/min (ref >60)
Glucose, Bld: 108 mg/dL — ABNORMAL HIGH (ref 70–99)
Potassium: 4.8 mmol/L (ref 3.5–5.1)
Sodium: 141 mmol/L (ref 135–145)
Total Bilirubin: 0.3 mg/dL (ref 0.3–1.2)
Total Protein: 6.8 g/dL (ref 6.5–8.1)

## 2019-04-18 LAB — CBC WITH DIFFERENTIAL (CANCER CENTER ONLY)
Abs Immature Granulocytes: 0.01 10*3/uL (ref 0.00–0.07)
Basophils Absolute: 0 10*3/uL (ref 0.0–0.1)
Basophils Relative: 1 %
Eosinophils Absolute: 0 10*3/uL (ref 0.0–0.5)
Eosinophils Relative: 0 %
HCT: 32.2 % — ABNORMAL LOW (ref 39.0–52.0)
Hemoglobin: 10.5 g/dL — ABNORMAL LOW (ref 13.0–17.0)
Immature Granulocytes: 0 %
Lymphocytes Relative: 21 %
Lymphs Abs: 0.6 10*3/uL — ABNORMAL LOW (ref 0.7–4.0)
MCH: 20.7 pg — ABNORMAL LOW (ref 26.0–34.0)
MCHC: 32.6 g/dL (ref 30.0–36.0)
MCV: 63.5 fL — ABNORMAL LOW (ref 80.0–100.0)
Monocytes Absolute: 0.2 10*3/uL (ref 0.1–1.0)
Monocytes Relative: 8 %
Neutro Abs: 1.9 10*3/uL (ref 1.7–7.7)
Neutrophils Relative %: 70 %
Platelet Count: 286 10*3/uL (ref 150–400)
RBC: 5.07 MIL/uL (ref 4.22–5.81)
RDW: 24.7 % — ABNORMAL HIGH (ref 11.5–15.5)
WBC Count: 2.7 10*3/uL — ABNORMAL LOW (ref 4.0–10.5)
nRBC: 1.9 % — ABNORMAL HIGH (ref 0.0–0.2)

## 2019-04-18 MED ORDER — DIPHENHYDRAMINE HCL 50 MG/ML IJ SOLN
INTRAMUSCULAR | Status: AC
  Filled 2019-04-18: qty 1

## 2019-04-18 MED ORDER — PALONOSETRON HCL INJECTION 0.25 MG/5ML
INTRAVENOUS | Status: AC
  Filled 2019-04-18: qty 5

## 2019-04-18 MED ORDER — INFLUENZA VAC A&B SA ADJ QUAD 0.5 ML IM PRSY
PREFILLED_SYRINGE | INTRAMUSCULAR | Status: AC
  Filled 2019-04-18: qty 0.5

## 2019-04-18 MED ORDER — SODIUM CHLORIDE 0.9 % IV SOLN
Freq: Once | INTRAVENOUS | Status: AC
  Administered 2019-04-18: 15:00:00 via INTRAVENOUS
  Filled 2019-04-18: qty 250

## 2019-04-18 MED ORDER — SODIUM CHLORIDE 0.9 % IV SOLN
20.0000 mg | Freq: Once | INTRAVENOUS | Status: AC
  Administered 2019-04-18: 20 mg via INTRAVENOUS
  Filled 2019-04-18: qty 20

## 2019-04-18 MED ORDER — SODIUM CHLORIDE 0.9 % IV SOLN
172.8000 mg | Freq: Once | INTRAVENOUS | Status: AC
  Administered 2019-04-18: 170 mg via INTRAVENOUS
  Filled 2019-04-18: qty 17

## 2019-04-18 MED ORDER — DIPHENHYDRAMINE HCL 50 MG/ML IJ SOLN
50.0000 mg | Freq: Once | INTRAMUSCULAR | Status: AC
  Administered 2019-04-18: 50 mg via INTRAVENOUS

## 2019-04-18 MED ORDER — SODIUM CHLORIDE 0.9 % IV SOLN
Freq: Once | INTRAVENOUS | Status: DC
  Filled 2019-04-18: qty 250

## 2019-04-18 MED ORDER — FAMOTIDINE IN NACL 20-0.9 MG/50ML-% IV SOLN
20.0000 mg | Freq: Once | INTRAVENOUS | Status: AC
  Administered 2019-04-18: 20 mg via INTRAVENOUS

## 2019-04-18 MED ORDER — INFLUENZA VAC A&B SA ADJ QUAD 0.5 ML IM PRSY
0.5000 mL | PREFILLED_SYRINGE | Freq: Once | INTRAMUSCULAR | Status: AC
  Administered 2019-04-18: 0.5 mL via INTRAMUSCULAR

## 2019-04-18 MED ORDER — SODIUM CHLORIDE 0.9 % IV SOLN
45.0000 mg/m2 | Freq: Once | INTRAVENOUS | Status: AC
  Administered 2019-04-18: 84 mg via INTRAVENOUS
  Filled 2019-04-18: qty 14

## 2019-04-18 MED ORDER — SODIUM CHLORIDE 0.9 % IV SOLN
Freq: Once | INTRAVENOUS | Status: AC
  Administered 2019-04-18: 17:00:00 via INTRAVENOUS
  Filled 2019-04-18: qty 250

## 2019-04-18 MED ORDER — RIVAROXABAN 20 MG PO TABS: 20.0000 mg | ORAL_TABLET | Freq: Every day | ORAL | 0 refills | Status: DC

## 2019-04-18 MED ORDER — PALONOSETRON HCL INJECTION 0.25 MG/5ML
0.2500 mg | Freq: Once | INTRAVENOUS | Status: AC
  Administered 2019-04-18: 0.25 mg via INTRAVENOUS

## 2019-04-18 NOTE — Progress Notes (Signed)
[place] Telephone:([phone]   Fax:(336) [phone]  OFFICE PROGRESS NOTE  [name], NP [address] [id number]  DIAGNOSIS: Stage IIIA(T3, N2, M0) non-small cell lung cancer, squamous cell carcinoma presented with large right middle lobe lung mass with postobstructive pneumonia as well as right hilar and mediastinal lymphadenopathy diagnosed in July 2020. He also has a suspicious small right pleural effusion and small indeterminate metabolic focus in the liver.   PRIOR THERAPY: None  CURRENT THERAPY: Concurrent chemoradiation with Carboplatin for an AUC of 2 and Paclitaxel 45 mg/m2. First dose expected on 01/24/2019.  Status post 7 cycles.  INTERVAL HISTORY: Richard [name] 78 y.o. male returns to the clinic today for follow-up visit.  He was accompanied by his daughter.  The patient is feeling fine today with no concerning complaints except for fatigue.  He denied having any current chest pain but has shortness of breath with exertion with mild cough and no hemoptysis.  He denied having any fever or chills.  He has no current nausea, vomiting, diarrhea or constipation.  He continues to tolerate his treatment with concurrent chemoradiation fairly well.  The patient is here today for evaluation before starting cycle #8 of his treatment.   MEDICAL HISTORY: Past Medical History:  Diagnosis Date   Dementia ([place])    High cholesterol    Hypertension    Lung mass 12/2018    ALLERGIES:  has No Known Allergies.  MEDICATIONS:  Current Outpatient Medications  Medication Sig Dispense Refill   ferrous sulfate 325 (65 FE) MG tablet Take 325 mg by mouth 2 (two) times daily with a meal.     midodrine (PROAMATINE) 5 MG tablet Take 1 tablet (5 mg total) by mouth 2 (two) times daily with a meal. 30 tablet 0   Multiple Vitamin (MULTIVITAMIN) tablet Take 1 tablet by mouth daily.     prochlorperazine (COMPAZINE) 10 MG tablet Take 1 tablet (10  mg total) by mouth every 6 (six) hours as needed for nausea or vomiting. 30 tablet 0   rivaroxaban (XARELTO) 20 MG TABS tablet Take 1 tablet (20 mg total) by mouth daily with supper. 30 tablet 0   No current facility-administered medications for this visit.     SURGICAL HISTORY:  Past Surgical History:  Procedure Laterality Date   JOINT REPLACEMENT     VIDEO BRONCHOSCOPY WITH ENDOBRONCHIAL ULTRASOUND N/A 12/16/2018   Procedure: VIDEO BRONCHOSCOPY WITH ENDOBRONCHIAL ULTRASOUND AND FLUROSCOPY;  Surgeon: [name], MD;  Location: [facility];  Service: Pulmonary;  Laterality: N/A;    REVIEW OF SYSTEMS:  A comprehensive review of systems was negative except for: Constitutional: positive for anorexia and fatigue Respiratory: positive for cough Musculoskeletal: positive for muscle weakness   PHYSICAL EXAMINATION: General appearance: alert, cooperative, fatigued and no distress Head: Normocephalic, without obvious abnormality, atraumatic Neck: no adenopathy, no JVD, supple, symmetrical, trachea midline and thyroid not enlarged, symmetric, no tenderness/mass/nodules Lymph nodes: Cervical, supraclavicular, and axillary nodes normal. Resp: clear to auscultation bilaterally Back: symmetric, no curvature. ROM normal. No CVA tenderness. Cardio: regular rate and rhythm, S1, S2 normal, no murmur, click, rub or gallop GI: soft, non-tender; bowel sounds normal; no masses,  no organomegaly Extremities: extremities normal, atraumatic, no cyanosis or edema  ECOG PERFORMANCE STATUS: 1 - Symptomatic but completely ambulatory  Blood pressure 101/62, pulse 95, temperature 98 F (36.7 C), temperature source Temporal, resp. rate 17, height 6\' 2"  (1.88 m), weight 146 lb 6.4 oz (66.4 kg),  SpO2 100 %.  LABORATORY DATA: Lab Results  Component Value Date   WBC 4.0 04/11/2019   HGB 11.1 (L) 04/11/2019   HCT 34.7 (L) 04/11/2019   MCV 62.5 (L) 04/11/2019   PLT 376 04/11/2019      Chemistry      Component  Value Date/Time   NA 142 04/11/2019 1337   NA 145 (H) 10/29/2016 1513   K 4.1 04/11/2019 1337   CL 104 04/11/2019 1337   CO2 25 04/11/2019 1337   BUN 13 04/11/2019 1337   BUN 12 10/29/2016 1513   CREATININE 0.78 04/11/2019 1337      Component Value Date/Time   CALCIUM 10.1 04/11/2019 1337   ALKPHOS 96 04/11/2019 1337   AST 15 04/11/2019 1337   ALT 16 04/11/2019 1337   BILITOT 0.3 04/11/2019 1337       RADIOGRAPHIC STUDIES: Ct Head Wo Contrast  Result Date: 03/23/2019 CLINICAL DATA:  Trip and fall EXAM: CT HEAD WITHOUT CONTRAST TECHNIQUE: Contiguous axial images were obtained from the base of the skull through the vertex without intravenous contrast. COMPARISON:  MR brain, 01/04/2019 FINDINGS: Brain: No evidence of acute infarction, hemorrhage, hydrocephalus, extra-axial collection or mass lesion/mass effect. Redemonstrated ventriculomegaly and chronic global volume loss. Vascular: No hyperdense vessel or unexpected calcification. Skull: Normal. Negative for fracture or focal lesion. Sinuses/Orbits: No acute finding. Other: None. IMPRESSION: 1.  No acute intracranial pathology. 2. Redemonstrated ventriculomegaly and chronic global volume loss, unchanged compared to prior examination. Electronically Signed   By: Eddie Candle M.D.   On: 03/23/2019 16:51   Dg Hand Complete Left  Result Date: 03/23/2019 CLINICAL DATA:  Fall.  Left hand pain.  Initial encounter. EXAM: LEFT HAND - COMPLETE 3+ VIEW COMPARISON:  None. FINDINGS: Mild soft swelling is present at the IP joint of the thumb. There is no underlying fracture. Mild degenerative changes are present at the joint. No other acute bone or soft tissue abnormalities are present. The wrist is located. IMPRESSION: 1. Soft tissue swelling at the IP joint of the thumb without acute fracture. 2. Otherwise unremarkable hand radiographs. Electronically Signed   By: San Morelle M.D.   On: 03/23/2019 16:29    ASSESSMENT AND PLAN: This is a  very pleasant 78 years old African-American male with a stage IIIA non-small cell lung cancer, squamous cell carcinoma. The patient started a course of concurrent chemoradiation with weekly carboplatin for AUC of 2 and paclitaxel 45 mg/M2 status post 7 cycles but this treatment was interrupted secondary to recent hospitalization for pneumonia and pulmonary embolism. He has been tolerating his treatment well with no concerning adverse effect except for fatigue. I recommended for the patient to proceed with cycle #8 today as planned. He will come back for follow-up visit in 2 weeks for evaluation before completion of his treatment and for consideration of ordering the restaging scans. The patient was advised to call immediately if he has any concerning symptoms in the interval. The patient voices understanding of current disease status and treatment options and is in agreement with the current care plan.  All questions were answered. The patient knows to call the clinic with any problems, questions or concerns. We can certainly see the patient much sooner if necessary.  I spent 10 minutes counseling the patient face to face. The total time spent in the appointment was 15 minutes.  Disclaimer: This note was dictated with voice recognition software. Similar sounding words can inadvertently be transcribed and may not be corrected upon  review.

## 2019-04-18 NOTE — Progress Notes (Unsigned)
Provided a complimentary case of Ensure Enlive.

## 2019-04-18 NOTE — Patient Instructions (Signed)
[facility] Discharge Instructions for Patients Receiving Chemotherapy  Today you received the following chemotherapy agents:  Taxol, Carboplatin  To help prevent nausea and vomiting after your treatment, we encourage you to take your nausea medication as prescribed.   If you develop nausea and vomiting that is not controlled by your nausea medication, call the clinic.   BELOW ARE SYMPTOMS THAT SHOULD BE REPORTED IMMEDIATELY:  *FEVER GREATER THAN 100.5 F  *CHILLS WITH OR WITHOUT FEVER  NAUSEA AND VOMITING THAT IS NOT CONTROLLED WITH YOUR NAUSEA MEDICATION  *UNUSUAL SHORTNESS OF BREATH  *UNUSUAL BRUISING OR BLEEDING  TENDERNESS IN MOUTH AND THROAT WITH OR WITHOUT PRESENCE OF ULCERS  *URINARY PROBLEMS  *BOWEL PROBLEMS  UNUSUAL RASH Items with * indicate a potential emergency and should be followed up as soon as possible.  Feel free to call the clinic should you have any questions or concerns. The clinic phone number is [phone].  Please show the CHEMO ALERT CARD at check-in to the Emergency Department and triage nurse.   

## 2019-04-19 ENCOUNTER — Ambulatory Visit
Admission: RE | Admit: 2019-04-19 | Discharge: 2019-04-19 | Disposition: A | Discharge status: Home / Self Care | Payer: Medicare HMO | Source: Ambulatory Visit | Attending: Radiation Oncology | Admitting: Radiation Oncology

## 2019-04-19 ENCOUNTER — Other Ambulatory Visit: Payer: Self-pay

## 2019-04-19 ENCOUNTER — Telehealth: Payer: Self-pay | Admitting: Internal Medicine

## 2019-04-19 DIAGNOSIS — C342 Malignant neoplasm of middle lobe, bronchus or lung: Secondary | ICD-10-CM | POA: Diagnosis not present

## 2019-04-19 NOTE — Telephone Encounter (Signed)
Scheduled per los. Called and spoke with daughter. Confirmed appt

## 2019-04-20 ENCOUNTER — Ambulatory Visit
Admission: RE | Admit: 2019-04-20 | Discharge: 2019-04-20 | Disposition: A | Discharge status: Home / Self Care | Payer: Medicare HMO | Source: Ambulatory Visit | Attending: Radiation Oncology | Admitting: Radiation Oncology

## 2019-04-20 ENCOUNTER — Other Ambulatory Visit: Payer: Self-pay

## 2019-04-20 ENCOUNTER — Other Ambulatory Visit: Payer: Self-pay | Admitting: *Deleted

## 2019-04-20 DIAGNOSIS — C342 Malignant neoplasm of middle lobe, bronchus or lung: Secondary | ICD-10-CM | POA: Diagnosis not present

## 2019-04-20 MED ORDER — RIVAROXABAN 20 MG PO TABS: 20.0000 mg | ORAL_TABLET | Freq: Every day | ORAL | 0 refills | Status: DC

## 2019-04-20 MED FILL — XARELTO 20 MG TABLET: 20 | 30 days supply | Qty: 30 | Fill #0

## 2019-04-20 NOTE — Telephone Encounter (Signed)
Pt requesting Rx for Xarelto be sent to [facility]

## 2019-04-21 ENCOUNTER — Other Ambulatory Visit: Payer: Self-pay

## 2019-04-21 ENCOUNTER — Ambulatory Visit
Admission: RE | Admit: 2019-04-21 | Discharge: 2019-04-21 | Disposition: A | Discharge status: Home / Self Care | Payer: Medicare HMO | Source: Ambulatory Visit | Attending: Radiation Oncology | Admitting: Radiation Oncology

## 2019-04-21 DIAGNOSIS — C342 Malignant neoplasm of middle lobe, bronchus or lung: Secondary | ICD-10-CM | POA: Diagnosis not present

## 2019-04-22 ENCOUNTER — Other Ambulatory Visit: Payer: Self-pay

## 2019-04-22 ENCOUNTER — Ambulatory Visit
Admission: RE | Admit: 2019-04-22 | Discharge: 2019-04-22 | Disposition: A | Discharge status: Home / Self Care | Payer: Medicare HMO | Source: Ambulatory Visit | Attending: Radiation Oncology | Admitting: Radiation Oncology

## 2019-04-22 DIAGNOSIS — C342 Malignant neoplasm of middle lobe, bronchus or lung: Secondary | ICD-10-CM | POA: Diagnosis not present

## 2019-04-25 ENCOUNTER — Other Ambulatory Visit: Payer: Medicare HMO

## 2019-04-25 ENCOUNTER — Ambulatory Visit: Payer: Medicare HMO

## 2019-04-26 ENCOUNTER — Ambulatory Visit
Admission: RE | Admit: 2019-04-26 | Discharge: 2019-04-26 | Disposition: A | Discharge status: Home / Self Care | Payer: Medicare HMO | Source: Ambulatory Visit | Attending: Radiation Oncology | Admitting: Radiation Oncology

## 2019-04-26 ENCOUNTER — Other Ambulatory Visit: Payer: Self-pay

## 2019-04-26 DIAGNOSIS — C342 Malignant neoplasm of middle lobe, bronchus or lung: Secondary | ICD-10-CM | POA: Diagnosis not present

## 2019-04-27 ENCOUNTER — Other Ambulatory Visit: Payer: Self-pay

## 2019-04-27 ENCOUNTER — Ambulatory Visit
Admission: RE | Admit: 2019-04-27 | Discharge: 2019-04-27 | Disposition: A | Discharge status: Home / Self Care | Payer: Medicare HMO | Source: Ambulatory Visit | Attending: Radiation Oncology | Admitting: Radiation Oncology

## 2019-04-27 ENCOUNTER — Ambulatory Visit: Payer: Medicare HMO

## 2019-04-27 DIAGNOSIS — C342 Malignant neoplasm of middle lobe, bronchus or lung: Secondary | ICD-10-CM | POA: Diagnosis not present

## 2019-04-28 ENCOUNTER — Ambulatory Visit: Payer: Medicare HMO

## 2019-04-29 ENCOUNTER — Other Ambulatory Visit: Payer: Self-pay

## 2019-04-29 ENCOUNTER — Ambulatory Visit: Payer: Medicare HMO

## 2019-04-29 ENCOUNTER — Ambulatory Visit
Admission: RE | Admit: 2019-04-29 | Discharge: 2019-04-29 | Disposition: A | Discharge status: Home / Self Care | Payer: Medicare HMO | Source: Ambulatory Visit | Attending: Radiation Oncology | Admitting: Radiation Oncology

## 2019-04-29 DIAGNOSIS — C342 Malignant neoplasm of middle lobe, bronchus or lung: Secondary | ICD-10-CM | POA: Diagnosis not present

## 2019-05-02 ENCOUNTER — Inpatient Hospital Stay: Payer: Medicare HMO

## 2019-05-02 ENCOUNTER — Telehealth: Payer: Self-pay | Admitting: Physician Assistant

## 2019-05-02 ENCOUNTER — Encounter: Payer: Self-pay | Admitting: Physician Assistant

## 2019-05-02 ENCOUNTER — Inpatient Hospital Stay (HOSPITAL_BASED_OUTPATIENT_CLINIC_OR_DEPARTMENT_OTHER): Payer: Medicare HMO | Admitting: Physician Assistant

## 2019-05-02 ENCOUNTER — Other Ambulatory Visit: Payer: Self-pay

## 2019-05-02 ENCOUNTER — Ambulatory Visit
Admission: RE | Admit: 2019-05-02 | Discharge: 2019-05-02 | Disposition: A | Discharge status: Home / Self Care | Payer: Medicare HMO | Source: Ambulatory Visit | Attending: Radiation Oncology | Admitting: Radiation Oncology

## 2019-05-02 ENCOUNTER — Ambulatory Visit: Payer: Medicare HMO

## 2019-05-02 VITALS — BP 106/56 | HR 100 | Temp 97.8°F | Resp 18 | Ht 74.0 in | Wt 144.1 lb

## 2019-05-02 DIAGNOSIS — C3491 Malignant neoplasm of unspecified part of right bronchus or lung: Secondary | ICD-10-CM

## 2019-05-02 DIAGNOSIS — R69 Illness, unspecified: Secondary | ICD-10-CM | POA: Diagnosis not present

## 2019-05-02 DIAGNOSIS — Z7901 Long term (current) use of anticoagulants: Secondary | ICD-10-CM | POA: Diagnosis not present

## 2019-05-02 DIAGNOSIS — Z23 Encounter for immunization: Secondary | ICD-10-CM | POA: Diagnosis not present

## 2019-05-02 DIAGNOSIS — Z5111 Encounter for antineoplastic chemotherapy: Secondary | ICD-10-CM | POA: Diagnosis not present

## 2019-05-02 DIAGNOSIS — R8290 Unspecified abnormal findings in urine: Secondary | ICD-10-CM

## 2019-05-02 DIAGNOSIS — E860 Dehydration: Secondary | ICD-10-CM

## 2019-05-02 DIAGNOSIS — Z8701 Personal history of pneumonia (recurrent): Secondary | ICD-10-CM | POA: Diagnosis not present

## 2019-05-02 DIAGNOSIS — Z79899 Other long term (current) drug therapy: Secondary | ICD-10-CM | POA: Diagnosis not present

## 2019-05-02 DIAGNOSIS — I10 Essential (primary) hypertension: Secondary | ICD-10-CM | POA: Diagnosis not present

## 2019-05-02 DIAGNOSIS — E7800 Pure hypercholesterolemia, unspecified: Secondary | ICD-10-CM | POA: Diagnosis not present

## 2019-05-02 DIAGNOSIS — Z86711 Personal history of pulmonary embolism: Secondary | ICD-10-CM | POA: Diagnosis not present

## 2019-05-02 DIAGNOSIS — C342 Malignant neoplasm of middle lobe, bronchus or lung: Secondary | ICD-10-CM | POA: Diagnosis not present

## 2019-05-02 LAB — CBC WITH DIFFERENTIAL (CANCER CENTER ONLY)
Abs Immature Granulocytes: 0.03 10*3/uL (ref 0.00–0.07)
Basophils Absolute: 0 10*3/uL (ref 0.0–0.1)
Basophils Relative: 0 %
Eosinophils Absolute: 0 10*3/uL (ref 0.0–0.5)
Eosinophils Relative: 0 %
HCT: 35.7 % — ABNORMAL LOW (ref 39.0–52.0)
Hemoglobin: 11.6 g/dL — ABNORMAL LOW (ref 13.0–17.0)
Immature Granulocytes: 1 %
Lymphocytes Relative: 15 %
Lymphs Abs: 0.7 10*3/uL (ref 0.7–4.0)
MCH: 20.9 pg — ABNORMAL LOW (ref 26.0–34.0)
MCHC: 32.5 g/dL (ref 30.0–36.0)
MCV: 64.3 fL — ABNORMAL LOW (ref 80.0–100.0)
Monocytes Absolute: 0.8 10*3/uL (ref 0.1–1.0)
Monocytes Relative: 17 %
Neutro Abs: 3 10*3/uL (ref 1.7–7.7)
Neutrophils Relative %: 67 %
Platelet Count: 336 10*3/uL (ref 150–400)
RBC: 5.55 MIL/uL (ref 4.22–5.81)
RDW: 25.5 % — ABNORMAL HIGH (ref 11.5–15.5)
WBC Count: 4.5 10*3/uL (ref 4.0–10.5)
nRBC: 0.7 % — ABNORMAL HIGH (ref 0.0–0.2)

## 2019-05-02 LAB — CMP (CANCER CENTER ONLY)
ALT: 18 U/L (ref 0–44)
AST: 19 U/L (ref 15–41)
Albumin: 3.1 g/dL — ABNORMAL LOW (ref 3.5–5.0)
Alkaline Phosphatase: 116 U/L (ref 38–126)
Anion gap: 13 (ref 5–15)
BUN: 11 mg/dL (ref 8–23)
CO2: 23 mmol/L (ref 22–32)
Calcium: 9.4 mg/dL (ref 8.9–10.3)
Chloride: 102 mmol/L (ref 98–111)
Creatinine: 0.78 mg/dL (ref 0.61–1.24)
GFR, Est AFR Am: >60 mL/min (ref >60)
GFR, Estimated: >60 mL/min (ref >60)
Glucose, Bld: 121 mg/dL — ABNORMAL HIGH (ref 70–99)
Potassium: 4.9 mmol/L (ref 3.5–5.1)
Sodium: 138 mmol/L (ref 135–145)
Total Bilirubin: 0.3 mg/dL (ref 0.3–1.2)
Total Protein: 7 g/dL (ref 6.5–8.1)

## 2019-05-02 MED ORDER — SODIUM CHLORIDE 0.9 % IV SOLN
45.0000 mg/m2 | Freq: Once | INTRAVENOUS | Status: AC
  Administered 2019-05-02: 84 mg via INTRAVENOUS
  Filled 2019-05-02: qty 14

## 2019-05-02 MED ORDER — PALONOSETRON HCL INJECTION 0.25 MG/5ML
0.2500 mg | Freq: Once | INTRAVENOUS | Status: AC
  Administered 2019-05-02: 0.25 mg via INTRAVENOUS

## 2019-05-02 MED ORDER — SODIUM CHLORIDE 0.9 % IV SOLN
172.8000 mg | Freq: Once | INTRAVENOUS | Status: AC
  Administered 2019-05-02: 170 mg via INTRAVENOUS
  Filled 2019-05-02: qty 17

## 2019-05-02 MED ORDER — DIPHENHYDRAMINE HCL 50 MG/ML IJ SOLN
50.0000 mg | Freq: Once | INTRAMUSCULAR | Status: AC
  Administered 2019-05-02: 50 mg via INTRAVENOUS

## 2019-05-02 MED ORDER — FAMOTIDINE IN NACL 20-0.9 MG/50ML-% IV SOLN
20.0000 mg | Freq: Once | INTRAVENOUS | Status: AC
  Administered 2019-05-02: 20 mg via INTRAVENOUS

## 2019-05-02 MED ORDER — PALONOSETRON HCL INJECTION 0.25 MG/5ML
INTRAVENOUS | Status: AC
  Filled 2019-05-02: qty 5

## 2019-05-02 MED ORDER — FAMOTIDINE IN NACL 20-0.9 MG/50ML-% IV SOLN
INTRAVENOUS | Status: AC
  Filled 2019-05-02: qty 50

## 2019-05-02 MED ORDER — SODIUM CHLORIDE 0.9 % IV SOLN
Freq: Once | INTRAVENOUS | Status: AC
  Administered 2019-05-02: 17:00:00 via INTRAVENOUS
  Filled 2019-05-02: qty 250

## 2019-05-02 MED ORDER — SODIUM CHLORIDE 0.9 % IV SOLN
Freq: Once | INTRAVENOUS | Status: DC
  Filled 2019-05-02: qty 250

## 2019-05-02 MED ORDER — SODIUM CHLORIDE 0.9 % IV SOLN
20.0000 mg | Freq: Once | INTRAVENOUS | Status: AC
  Administered 2019-05-02: 20 mg via INTRAVENOUS
  Filled 2019-05-02: qty 20

## 2019-05-02 MED ORDER — DIPHENHYDRAMINE HCL 50 MG/ML IJ SOLN
INTRAMUSCULAR | Status: AC
  Filled 2019-05-02: qty 1

## 2019-05-02 MED ORDER — SODIUM CHLORIDE 0.9 % IV SOLN
Freq: Once | INTRAVENOUS | Status: AC
  Administered 2019-05-02: 14:00:00 via INTRAVENOUS
  Filled 2019-05-02: qty 250

## 2019-05-02 NOTE — Telephone Encounter (Signed)
Scheduled appt per 11/16 los. ° °Printed calendar and avs. °

## 2019-05-02 NOTE — Patient Instructions (Signed)
[facility] Discharge Instructions for Patients Receiving Chemotherapy  Today you received the following chemotherapy agents Taxol, Carboplatin  To help prevent nausea and vomiting after your treatment, we encourage you to take your nausea medication as directed  If you develop nausea and vomiting that is not controlled by your nausea medication, call the clinic.   BELOW ARE SYMPTOMS THAT SHOULD BE REPORTED IMMEDIATELY:  *FEVER GREATER THAN 100.5 F  *CHILLS WITH OR WITHOUT FEVER  NAUSEA AND VOMITING THAT IS NOT CONTROLLED WITH YOUR NAUSEA MEDICATION  *UNUSUAL SHORTNESS OF BREATH  *UNUSUAL BRUISING OR BLEEDING  TENDERNESS IN MOUTH AND THROAT WITH OR WITHOUT PRESENCE OF ULCERS  *URINARY PROBLEMS  *BOWEL PROBLEMS  UNUSUAL RASH Items with * indicate a potential emergency and should be followed up as soon as possible.  Feel free to call the clinic should you have any questions or concerns. The clinic phone number is [phone].  Please show the CHEMO ALERT CARD at check-in to the Emergency Department and triage nurse.   

## 2019-05-02 NOTE — Progress Notes (Signed)
[place] OFFICE PROGRESS NOTE  [name], NP [address] [place] [address]  DIAGNOSIS: Stage IIIA(T3, N2, M0) non-small cell lung cancer, squamous cell carcinoma presented with large right middle lobe lung mass with postobstructive pneumonia as well as right hilar and mediastinal lymphadenopathy diagnosed in July 2020. He also has a suspicious small right pleural effusion and small indeterminate metabolic focus in the liver.  PRIOR THERAPY: None   CURRENT THERAPY: Concurrent chemoradiation with Carboplatin for an AUC of 2 and Paclitaxel 45 mg/m2. First dose expected on 01/24/2019.  Status post 8 cycles.  INTERVAL HISTORY: Richard [name] 78 y.o. male returns to the clinic for a follow up visit. The patient was scheduled to receive his last chemotherapy treatment last week; however, the patient's daughter/primary caregiver was sick last week and was unable to take him to his treatment.   The patient is scheduled to receive his last radiation treatment on 05/09/2019. Today, he continue to endorse fatigue. His daughter states that the patient is not eating enough food or drinking enough fluids. His daughter states that his urine is so dark that it is "not even yellow" and is reportedly brownish and has a foul odor. His daughter tries to give him V8 juice and ensure for supplemental nutrition. The daughter also mentions that they need nursing assistance at home. The patient is starting to develop a pressure ulcer despite trying to frequently reposition him. They also report that he is incontinent. A home health agency was scheduled to come to his house at some point but they never showed up at the coordinated time and the patient and his daughter have not been in contact with them since.   He denies any fevers, chills, night sweats, or significant weight loss since his last appointment. He has a baseline productive cough for which they use OTC cough medication. He  endorses baseline shortness of breath and denies any chest pain or hemoptysis. Denies any nausea, vomiting, diarrhea, or constipation. His daughter states that he has watery eyes and nasal congestion for 1 week.   MEDICAL HISTORY: Past Medical History:  Diagnosis Date  . Dementia ([place])   . High cholesterol   . Hypertension   . Lung mass 12/2018    ALLERGIES:  has No Known Allergies.  MEDICATIONS:  Current Outpatient Medications  Medication Sig Dispense Refill  . ferrous sulfate 325 (65 FE) MG tablet Take 325 mg by mouth 2 (two) times daily with a meal.    . Multiple Vitamin (MULTIVITAMIN) tablet Take 1 tablet by mouth daily.    . prochlorperazine (COMPAZINE) 10 MG tablet Take 1 tablet (10 mg total) by mouth every 6 (six) hours as needed for nausea or vomiting. 30 tablet 0  . rivaroxaban (XARELTO) 20 MG TABS tablet Take 1 tablet (20 mg total) by mouth daily with supper. 30 tablet 0  . midodrine (PROAMATINE) 5 MG tablet Take 1 tablet (5 mg total) by mouth 2 (two) times daily with a meal. (Patient not taking: Reported on 05/02/2019) 30 tablet 0   No current facility-administered medications for this visit.    Facility-Administered Medications Ordered in Other Visits  Medication Dose Route Frequency Provider Last Rate Last Dose  . 0.9 %  sodium chloride infusion   Intravenous Once Curt Bears, MD      . 0.9 %  sodium chloride infusion   Intravenous Once Jmichael Gille L, PA-C      . CARBOplatin (PARAPLATIN) 170 mg in sodium chloride 0.9 %  250 mL chemo infusion  170 mg Intravenous Once Curt Bears, MD      . dexamethasone (DECADRON) 20 mg in sodium chloride 0.9 % 50 mL IVPB  20 mg Intravenous Once Curt Bears, MD      . famotidine (PEPCID) IVPB 20 mg premix  20 mg Intravenous Once Curt Bears, MD      . PACLitaxel (TAXOL) 84 mg in sodium chloride 0.9 % 250 mL chemo infusion (</= 80mg /m2)  45 mg/m2 (Treatment Plan Recorded) Intravenous Once Curt Bears, MD         SURGICAL HISTORY:  Past Surgical History:  Procedure Laterality Date  . JOINT REPLACEMENT    . VIDEO BRONCHOSCOPY WITH ENDOBRONCHIAL ULTRASOUND N/A 12/16/2018   Procedure: VIDEO BRONCHOSCOPY WITH ENDOBRONCHIAL ULTRASOUND AND FLUROSCOPY;  Surgeon: [name], MD;  Location: [facility];  Service: Pulmonary;  Laterality: N/A;    REVIEW OF SYSTEMS:   Review of Systems  Constitutional: Positive for fatigue and decreased appetite. Negative for appetite change, chills,  fever and unexpected weight change.  HENT:   Negative for mouth sores, nosebleeds, sore throat and trouble swallowing.   Eyes: Negative for eye problems and icterus.  Respiratory: Negative for cough, hemoptysis, shortness of breath and wheezing.   Cardiovascular: Negative for chest pain and leg swelling.  Gastrointestinal: Negative for abdominal pain, constipation, diarrhea, nausea and vomiting.  Genitourinary: Negative for bladder incontinence, difficulty urinating, dysuria, frequency and hematuria.   Musculoskeletal: Negative for back pain, gait problem, neck pain and neck stiffness.  Skin: Negative for itching and rash.  Neurological: Negative for dizziness, extremity weakness, gait problem, headaches, light-headedness and seizures.  Hematological: Negative for adenopathy. Does not bruise/bleed easily.  Psychiatric/Behavioral: Negative for confusion, depression and sleep disturbance. The patient is not nervous/anxious.     PHYSICAL EXAMINATION:  Blood pressure (!) 106/56, pulse 100, temperature 97.8 F (36.6 C), temperature source Temporal, resp. rate 18, height 6\' 2"  (1.88 m), weight 144 lb 1.6 oz (65.4 kg), SpO2 100 %.  ECOG PERFORMANCE STATUS: 2 - Symptomatic, <50% confined to bed  Physical Exam  Constitutional: Oriented to person, place, and time and thin-appearing and chronically ill appearing male and in no distress.  HENT:  Head: Normocephalic and atraumatic.  Mouth/Throat: Oropharynx is clear and moist. No  oropharyngeal exudate.  Eyes: Conjunctivae are normal. Right eye exhibits no discharge. Left eye exhibits no discharge. No scleral icterus.  Neck: Normal range of motion. Neck supple.  Cardiovascular: Normal rate, regular rhythm, normal heart sounds and intact distal pulses.   Pulmonary/Chest: Effort normal. Quiet breath sounds in all lung fields. No respiratory distress. No wheezes. No rales.  Abdominal: Soft. Bowel sounds are normal. Exhibits no distension and no mass. There is no tenderness.  Musculoskeletal: Normal range of motion. Exhibits no edema.  Lymphadenopathy:    No cervical adenopathy.  Neurological: Alert and oriented to person, place, and time. Exhibits normal muscle tone. Gait normal. Coordination normal.  Skin: Skin is warm and dry. No rash noted. Not diaphoretic. No erythema. No pallor.  Psychiatric: Mood, memory and judgment normal.  Vitals reviewed.  LABORATORY DATA: Lab Results  Component Value Date   WBC 4.5 05/02/2019   HGB 11.6 (L) 05/02/2019   HCT 35.7 (L) 05/02/2019   MCV 64.3 (L) 05/02/2019   PLT 336 05/02/2019      Chemistry      Component Value Date/Time   NA 138 05/02/2019 0825   NA 145 (H) 10/29/2016 1513   K 4.9 05/02/2019 0825  CL 102 05/02/2019 0825   CO2 23 05/02/2019 0825   BUN 11 05/02/2019 0825   BUN 12 10/29/2016 1513   CREATININE 0.78 05/02/2019 0825      Component Value Date/Time   CALCIUM 9.4 05/02/2019 0825   ALKPHOS 116 05/02/2019 0825   AST 19 05/02/2019 0825   ALT 18 05/02/2019 0825   BILITOT 0.3 05/02/2019 0825       RADIOGRAPHIC STUDIES:  No results found.   ASSESSMENT/PLAN:  This is a very pleasant 78 year old african [place] male with atleast stage IIIA(T3, N2, M0) non-small cell lung cancer, squamous cell carcinoma presented with large right middle lobe lung mass with postobstructive pneumonia as well as right hilar and mediastinal lymphadenopathy diagnosed in July 2020. He also has a suspicious small right  pleural effusion and small indeterminate metabolic focus in the liver.  The patient started a course of concurrent chemoradiation with weekly carboplatin for AUC of 2 and paclitaxel 45 mg/M2 status post 2 cycles but this treatment was interrupted secondary to recent hospitalization for pneumonia and pulmonary embolism. He restarted treatment on 02/22/2019. He is status post 8 cycles of weekly concurrent chemoradiation. His last radiation treatment is scheduled for 05/09/2019 under the care of Dr. Sondra Come. He missed his last scheduled chemotherapy treatment last week.   The patient was seen with Dr. Julien Nordmann today. Dr. Julien Nordmann recommends that the patient receive his last dose of chemotherapy today. He will receive his 9th cycle of carboplatin and paclitaxel today. He will complete radiation on 05/09/2019 as planned.   We will arrange for a restaging CT scan of the chest to be performed in 4 weeks.   We will see him back for a follow up visit in 4 weeks for evaluation and to review his CT scan.   He will receive 1 L of fluid with normal saline today for dehydration and decreased fluid intake. He will continue with ensure for nutritional supplementation.   We will arrange for a urinalysis to be performed while in the clinic today due to his urine odor and incontinence.   He will use oral anti-histamines for his watery eyes.   We will placed an order for home health to provide nursing assistance at home due to his challenges with repositioning and ADLs.   The patient was advised to call immediately if he has any concerning symptoms in the interval. The patient voices understanding of current disease status and treatment options and is in agreement with the current care plan. All questions were answered. The patient knows to call the clinic with any problems, questions or concerns. We can certainly see the patient much sooner if necessary  Orders Placed This Encounter  Procedures  . CT Chest W  Contrast    Standing Status:   Future    Standing Expiration Date:   05/01/2020    Order Specific Question:   ** REASON FOR EXAM (FREE TEXT)    Answer:   Restaging Lung Cancer    Order Specific Question:   If indicated for the ordered procedure, I authorize the administration of contrast media per Radiology protocol    Answer:   Yes    Order Specific Question:   Preferred imaging location?    Answer:   [facility]    Order Specific Question:   Radiology Contrast Protocol - do NOT remove file path    Answer:   [email or web address]  . CBC with Differential ([place] Only)    Standing Status:  Future    Standing Expiration Date:   05/01/2020  . CMP ([place] only)    Standing Status:   Future    Standing Expiration Date:   05/01/2020  . Ambulatory referral to Home Health    Referral Priority:   Routine    Referral Type:   Home Health Care    Referral Reason:   Specialty Services Required    Requested Specialty:   [place]    Number of Visits Requested:   [place], PA-C 05/02/19  ADDENDUM: Hematology/Oncology Attending: The patient is seen and examined today.  I recommended his care plan.  This is a very pleasant 78 years old African-American male with stage IIIa non-small cell lung cancer, squamous cell carcinoma.  The patient is currently undergoing a course of concurrent chemoradiation with weekly carboplatin and paclitaxel.  He has been tolerating the treatment well except for fatigue and lack of appetite with poor p.o. intake. I recommended for the patient to proceed with the last cycle of his chemotherapy today as planned. For the dehydration and lack of appetite, we will arrange for the patient to have 1 L of normal saline intravenously in the clinic today. His daughter also mentioned that the patient has dark urine, we will check urinalysis for evaluation and to rule out urinary tract infection. We  will see him back for follow-up visit in 1 months for evaluation with repeat CT scan of the chest for restaging of his disease. The patient was advised to call immediately if he has any concerning symptoms in the interval.  Disclaimer: This note was dictated with voice recognition software. Similar sounding words can inadvertently be transcribed and may be missed upon review. Eilleen Kempf, MD 05/02/19

## 2019-05-03 ENCOUNTER — Ambulatory Visit
Admission: RE | Admit: 2019-05-03 | Discharge: 2019-05-03 | Disposition: A | Discharge status: Home / Self Care | Payer: Medicare HMO | Source: Ambulatory Visit | Attending: Radiation Oncology | Admitting: Radiation Oncology

## 2019-05-03 ENCOUNTER — Telehealth: Payer: Self-pay | Admitting: *Deleted

## 2019-05-03 ENCOUNTER — Other Ambulatory Visit: Payer: Self-pay

## 2019-05-03 ENCOUNTER — Ambulatory Visit: Payer: Medicare HMO

## 2019-05-03 DIAGNOSIS — C342 Malignant neoplasm of middle lobe, bronchus or lung: Secondary | ICD-10-CM | POA: Diagnosis not present

## 2019-05-03 NOTE — Telephone Encounter (Signed)
Yolanda Manges, RN  Phone Number: [phone]        If they can not [facility] actually can take it. It's actually covered out of [place] did not realize that.  does have a HHA. Let me know if [facility] comes through for you.   Previous Messages  ----- Message -----  From: Wilmon Arms, RN  Sent: 05/03/2019  1:50 PM EST  To: York Spaniel  Subject: RE: Byren, [place] thanks! I will see if [facility] can help out.    ----- Message -----  From: York Spaniel  Sent: 05/03/2019  1:36 PM EST  To: Wilmon Arms, RN  Subject: [place] Client                    Well....they can't staff either.Marland KitchenMarland KitchenI can not think of anyone else that can take [employer]. I would try [facility] or [facility]. I do not think that [facility] or [facility] can take. Are there any other [place] agencies that I'm missing?    ----- Message -----  From: York Spaniel  Sent: 05/03/2019 12:12 PM EST  To: Wilmon Arms, RN  Subject: RE: New Patient Referral for           I am cross referring to Eula Fried at [employer]. She will let you know if there are any questions.   Thank you!!!   ----- Message -----  From: Wilmon Arms, RN  Sent: 05/03/2019 10:57 AM EST  To: York Spaniel  Subject: RE: New Patient Referral for           Yes that is fine.  ----- Message -----  From: York Spaniel  Sent: 05/03/2019 10:30 AM EST  To: Wilmon Arms, RN  Subject: RE: New Patient Referral for           I can not staff the referral for [place]. Would you like me to cross refer him to [facility]? They are in network with [facility]   ----- Message -----  From: Wilmon Arms, RN  Sent: 05/02/2019  2:33 PM EST  To: York Spaniel  Subject: RE: New Patient Referral for           Yes you can go ahead with Delon Freda Munro.

## 2019-05-03 NOTE — Telephone Encounter (Signed)
Richard [name] is unable to staff referral.  They are redirecting to [facility] as they are in network with [facility].

## 2019-05-03 NOTE — Telephone Encounter (Signed)
Richard [name] unable to process referral as they don't have any available people to work the referral in this zip code.

## 2019-05-04 ENCOUNTER — Ambulatory Visit: Payer: Medicare HMO

## 2019-05-05 ENCOUNTER — Encounter: Payer: Self-pay | Admitting: Nutrition

## 2019-05-05 ENCOUNTER — Other Ambulatory Visit: Payer: Self-pay

## 2019-05-05 ENCOUNTER — Ambulatory Visit: Payer: Medicare HMO

## 2019-05-05 ENCOUNTER — Ambulatory Visit
Admission: RE | Admit: 2019-05-05 | Discharge: 2019-05-05 | Disposition: A | Discharge status: Home / Self Care | Payer: Medicare HMO | Source: Ambulatory Visit | Attending: Radiation Oncology | Admitting: Radiation Oncology

## 2019-05-05 DIAGNOSIS — C342 Malignant neoplasm of middle lobe, bronchus or lung: Secondary | ICD-10-CM | POA: Diagnosis not present

## 2019-05-05 DIAGNOSIS — R0602 Shortness of breath: Secondary | ICD-10-CM | POA: Diagnosis not present

## 2019-05-05 NOTE — Progress Notes (Signed)
Provided complementary case of Ensure Enlive.

## 2019-05-06 ENCOUNTER — Ambulatory Visit: Payer: Medicare HMO

## 2019-05-06 ENCOUNTER — Other Ambulatory Visit: Payer: Self-pay

## 2019-05-06 ENCOUNTER — Ambulatory Visit
Admission: RE | Admit: 2019-05-06 | Discharge: 2019-05-06 | Disposition: A | Discharge status: Home / Self Care | Payer: Medicare HMO | Source: Ambulatory Visit | Attending: Radiation Oncology | Admitting: Radiation Oncology

## 2019-05-06 DIAGNOSIS — C342 Malignant neoplasm of middle lobe, bronchus or lung: Secondary | ICD-10-CM | POA: Diagnosis not present

## 2019-05-08 ENCOUNTER — Other Ambulatory Visit: Payer: Self-pay

## 2019-05-08 ENCOUNTER — Emergency Department (HOSPITAL_COMMUNITY): Payer: Medicare HMO

## 2019-05-08 ENCOUNTER — Inpatient Hospital Stay (HOSPITAL_COMMUNITY)
Admission: EM | Admit: 2019-05-08 | Discharge: 2019-05-10 | DRG: 180 | Disposition: A | Discharge status: Home Health | Payer: Medicare HMO | Attending: Internal Medicine | Admitting: Internal Medicine

## 2019-05-08 ENCOUNTER — Encounter (HOSPITAL_COMMUNITY): Payer: Self-pay

## 2019-05-08 DIAGNOSIS — F17210 Nicotine dependence, cigarettes, uncomplicated: Secondary | ICD-10-CM | POA: Diagnosis present

## 2019-05-08 DIAGNOSIS — Z9221 Personal history of antineoplastic chemotherapy: Secondary | ICD-10-CM | POA: Diagnosis not present

## 2019-05-08 DIAGNOSIS — D509 Iron deficiency anemia, unspecified: Secondary | ICD-10-CM | POA: Diagnosis present

## 2019-05-08 DIAGNOSIS — Z7901 Long term (current) use of anticoagulants: Secondary | ICD-10-CM

## 2019-05-08 DIAGNOSIS — G309 Alzheimer's disease, unspecified: Secondary | ICD-10-CM

## 2019-05-08 DIAGNOSIS — E7800 Pure hypercholesterolemia, unspecified: Secondary | ICD-10-CM | POA: Diagnosis present

## 2019-05-08 DIAGNOSIS — Z431 Encounter for attention to gastrostomy: Secondary | ICD-10-CM | POA: Diagnosis not present

## 2019-05-08 DIAGNOSIS — F0280 Dementia in other diseases classified elsewhere without behavioral disturbance: Secondary | ICD-10-CM

## 2019-05-08 DIAGNOSIS — Z79899 Other long term (current) drug therapy: Secondary | ICD-10-CM | POA: Diagnosis not present

## 2019-05-08 DIAGNOSIS — Z20828 Contact with and (suspected) exposure to other viral communicable diseases: Secondary | ICD-10-CM | POA: Diagnosis present

## 2019-05-08 DIAGNOSIS — J9819 Other pulmonary collapse: Secondary | ICD-10-CM | POA: Diagnosis present

## 2019-05-08 DIAGNOSIS — R69 Illness, unspecified: Secondary | ICD-10-CM | POA: Diagnosis not present

## 2019-05-08 DIAGNOSIS — E785 Hyperlipidemia, unspecified: Secondary | ICD-10-CM | POA: Diagnosis present

## 2019-05-08 DIAGNOSIS — R64 Cachexia: Secondary | ICD-10-CM | POA: Diagnosis present

## 2019-05-08 DIAGNOSIS — F0390 Unspecified dementia without behavioral disturbance: Secondary | ICD-10-CM | POA: Diagnosis present

## 2019-05-08 DIAGNOSIS — R918 Other nonspecific abnormal finding of lung field: Secondary | ICD-10-CM | POA: Diagnosis not present

## 2019-05-08 DIAGNOSIS — I4891 Unspecified atrial fibrillation: Secondary | ICD-10-CM | POA: Diagnosis not present

## 2019-05-08 DIAGNOSIS — R0602 Shortness of breath: Secondary | ICD-10-CM | POA: Diagnosis not present

## 2019-05-08 DIAGNOSIS — Z86711 Personal history of pulmonary embolism: Secondary | ICD-10-CM

## 2019-05-08 DIAGNOSIS — C342 Malignant neoplasm of middle lobe, bronchus or lung: Principal | ICD-10-CM | POA: Diagnosis present

## 2019-05-08 DIAGNOSIS — Z66 Do not resuscitate: Secondary | ICD-10-CM | POA: Diagnosis present

## 2019-05-08 DIAGNOSIS — R6520 Severe sepsis without septic shock: Secondary | ICD-10-CM | POA: Diagnosis not present

## 2019-05-08 DIAGNOSIS — A419 Sepsis, unspecified organism: Secondary | ICD-10-CM | POA: Diagnosis not present

## 2019-05-08 DIAGNOSIS — Z515 Encounter for palliative care: Secondary | ICD-10-CM | POA: Diagnosis present

## 2019-05-08 DIAGNOSIS — I10 Essential (primary) hypertension: Secondary | ICD-10-CM | POA: Diagnosis not present

## 2019-05-08 DIAGNOSIS — R05 Cough: Secondary | ICD-10-CM | POA: Diagnosis not present

## 2019-05-08 DIAGNOSIS — I959 Hypotension, unspecified: Secondary | ICD-10-CM | POA: Diagnosis present

## 2019-05-08 DIAGNOSIS — R0600 Dyspnea, unspecified: Secondary | ICD-10-CM | POA: Diagnosis not present

## 2019-05-08 DIAGNOSIS — R590 Localized enlarged lymph nodes: Secondary | ICD-10-CM | POA: Diagnosis not present

## 2019-05-08 DIAGNOSIS — L89221 Pressure ulcer of left hip, stage 1: Secondary | ICD-10-CM | POA: Diagnosis not present

## 2019-05-08 DIAGNOSIS — R000 Tachycardia, unspecified: Secondary | ICD-10-CM | POA: Diagnosis present

## 2019-05-08 DIAGNOSIS — R0902 Hypoxemia: Secondary | ICD-10-CM | POA: Diagnosis not present

## 2019-05-08 DIAGNOSIS — E872 Acidosis: Secondary | ICD-10-CM | POA: Diagnosis present

## 2019-05-08 DIAGNOSIS — Z801 Family history of malignant neoplasm of trachea, bronchus and lung: Secondary | ICD-10-CM | POA: Diagnosis not present

## 2019-05-08 DIAGNOSIS — R509 Fever, unspecified: Secondary | ICD-10-CM | POA: Diagnosis not present

## 2019-05-08 DIAGNOSIS — R001 Bradycardia, unspecified: Secondary | ICD-10-CM | POA: Diagnosis not present

## 2019-05-08 DIAGNOSIS — E43 Unspecified severe protein-calorie malnutrition: Secondary | ICD-10-CM | POA: Diagnosis present

## 2019-05-08 DIAGNOSIS — Z681 Body mass index (BMI) 19 or less, adult: Secondary | ICD-10-CM | POA: Diagnosis not present

## 2019-05-08 DIAGNOSIS — R0689 Other abnormalities of breathing: Secondary | ICD-10-CM | POA: Diagnosis not present

## 2019-05-08 LAB — COMPREHENSIVE METABOLIC PANEL
ALT: 22 U/L (ref 0–44)
AST: 28 U/L (ref 15–41)
Albumin: 3 g/dL — ABNORMAL LOW (ref 3.5–5.0)
Alkaline Phosphatase: 81 U/L (ref 38–126)
Anion gap: 9 (ref 5–15)
BUN: 21 mg/dL (ref 8–23)
CO2: 22 mmol/L (ref 22–32)
Calcium: 8.8 mg/dL — ABNORMAL LOW (ref 8.9–10.3)
Chloride: 107 mmol/L (ref 98–111)
Creatinine, Ser: 0.87 mg/dL (ref 0.61–1.24)
GFR calc Af Amer: >60 mL/min (ref >60)
GFR calc non Af Amer: >60 mL/min (ref >60)
Glucose, Bld: 111 mg/dL — ABNORMAL HIGH (ref 70–99)
Potassium: 4.8 mmol/L (ref 3.5–5.1)
Sodium: 138 mmol/L (ref 135–145)
Total Bilirubin: 0.5 mg/dL (ref 0.3–1.2)
Total Protein: 6.4 g/dL — ABNORMAL LOW (ref 6.5–8.1)

## 2019-05-08 LAB — URINALYSIS, ROUTINE W REFLEX MICROSCOPIC
Bilirubin Urine: NEGATIVE
Glucose, UA: NEGATIVE mg/dL
Hgb urine dipstick: NEGATIVE
Ketones, ur: NEGATIVE mg/dL
Leukocytes,Ua: NEGATIVE
Nitrite: NEGATIVE
Protein, ur: NEGATIVE mg/dL
Specific Gravity, Urine: 1.016 (ref 1.005–1.030)
pH: 8 (ref 5.0–8.0)

## 2019-05-08 LAB — CBC WITH DIFFERENTIAL/PLATELET
Abs Immature Granulocytes: 0.09 10*3/uL — ABNORMAL HIGH (ref 0.00–0.07)
Basophils Absolute: 0 10*3/uL (ref 0.0–0.1)
Basophils Relative: 0 %
Eosinophils Absolute: 0 10*3/uL (ref 0.0–0.5)
Eosinophils Relative: 0 %
HCT: 35.4 % — ABNORMAL LOW (ref 39.0–52.0)
Hemoglobin: 11.1 g/dL — ABNORMAL LOW (ref 13.0–17.0)
Immature Granulocytes: 1 %
Lymphocytes Relative: 4 %
Lymphs Abs: 0.4 10*3/uL — ABNORMAL LOW (ref 0.7–4.0)
MCH: 20.6 pg — ABNORMAL LOW (ref 26.0–34.0)
MCHC: 31.4 g/dL (ref 30.0–36.0)
MCV: 65.6 fL — ABNORMAL LOW (ref 80.0–100.0)
Monocytes Absolute: 0.3 10*3/uL (ref 0.1–1.0)
Monocytes Relative: 4 %
Neutro Abs: 8.1 10*3/uL — ABNORMAL HIGH (ref 1.7–7.7)
Neutrophils Relative %: 91 %
Platelets: 382 10*3/uL (ref 150–400)
RBC: 5.4 MIL/uL (ref 4.22–5.81)
RDW: 26.1 % — ABNORMAL HIGH (ref 11.5–15.5)
WBC: 8.9 10*3/uL (ref 4.0–10.5)
nRBC: 0.2 % (ref 0.0–0.2)

## 2019-05-08 LAB — APTT: aPTT: 45 seconds — ABNORMAL HIGH (ref 24–36)

## 2019-05-08 LAB — LACTIC ACID, PLASMA
Lactic Acid, Venous: 2.8 mmol/L (ref 0.5–1.9)
Lactic Acid, Venous: 1.6 mmol/L (ref 0.5–1.9)

## 2019-05-08 LAB — POC SARS CORONAVIRUS 2 AG -  ED: SARS Coronavirus 2 Ag: NEGATIVE

## 2019-05-08 LAB — I-STAT BETA HCG BLOOD, ED (MC, WL, AP ONLY): I-stat hCG, quantitative: <5 m[IU]/mL (ref <5)

## 2019-05-08 LAB — PROTIME-INR
INR: 1.8 — ABNORMAL HIGH (ref 0.8–1.2)
Prothrombin Time: 20.9 seconds — ABNORMAL HIGH (ref 11.4–15.2)

## 2019-05-08 MED ORDER — VANCOMYCIN HCL 10 G IV SOLR
1250.0000 mg | Freq: Once | INTRAVENOUS | Status: AC
  Administered 2019-05-08: 17:00:00 1250 mg via INTRAVENOUS
  Filled 2019-05-08: qty 1250

## 2019-05-08 MED ORDER — SODIUM CHLORIDE 0.9 % IV SOLN
2.0000 g | Freq: Once | INTRAVENOUS | Status: AC
  Administered 2019-05-08: 2 g via INTRAVENOUS
  Filled 2019-05-08: qty 2

## 2019-05-08 MED ORDER — ACETAMINOPHEN 325 MG PO TABS
650.0000 mg | ORAL_TABLET | Freq: Once | ORAL | Status: AC
  Administered 2019-05-08: 650 mg via ORAL
  Filled 2019-05-08: qty 2

## 2019-05-08 MED ORDER — SODIUM CHLORIDE 0.9 % IV BOLUS
1000.0000 mL | Freq: Once | INTRAVENOUS | Status: AC
  Administered 2019-05-08: 1000 mL via INTRAVENOUS

## 2019-05-08 MED ORDER — LACTATED RINGERS IV BOLUS: 1000.0000 mL | Freq: Once | INTRAVENOUS | Status: DC

## 2019-05-08 MED ORDER — VANCOMYCIN HCL IN DEXTROSE 1-5 GM/200ML-% IV SOLN: 1000.0000 mg | Freq: Once | INTRAVENOUS | Status: DC

## 2019-05-08 NOTE — ED Notes (Signed)
Brother for pt, Richard [name] would like a phone call with an update, [phone].

## 2019-05-08 NOTE — ED Provider Notes (Signed)
[facility] DEPT Provider Note   CSN: [id number] Arrival date & time: 05/08/19  1406    LEVEL 5 CAVEAT - DEMENTIA   History   Chief Complaint Chief Complaint  Patient presents with  . Shortness of Breath  . Lung Cancer    HPI Richard [name] is a 78 y.o. male.     HPI  78 year old male presents with fever and shortness of breath.  History is limited because the patient is the only 1 present and he has dementia.  I was able to get in contact with the daughter over the phone who states the patient has had tachycardia on a recheck by the nurse today.  No fevers noted.  Cough for about a week.  Has been short of breath for a couple days.  Chronic dementia but not more altered than typical.  Past Medical History:  Diagnosis Date  . Dementia ([place])   . High cholesterol   . Hypertension   . Lung mass 12/2018    Patient Active Problem List   Diagnosis Date Noted  . Sepsis ([place]) 05/08/2019  . History of pulmonary embolism 03/30/2019  . Pure hypercholesterolemia 03/30/2019  . SOB (shortness of breath) 03/30/2019  . Atrial fibrillation with RVR ([place])   . Palliative care by specialist   . DNR (do not resuscitate) discussion   . Severe sepsis ([place]) 01/30/2019  . Acute pulmonary embolism ([place]) 01/30/2019  . Acute respiratory failure with hypoxia ([place]) 01/30/2019  . AKI (acute kidney injury) ([place]) 01/30/2019  . Poor appetite 01/05/2019  . Goals of care, counseling/discussion 12/28/2018  . Encounter for antineoplastic chemotherapy 12/28/2018  . 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 Dementia without behavioral disturbance ([place]) 12/16/2018  . Lung mass 12/15/2018  . Pelvic mass in male 12/15/2018  . Postobstructive pneumonia 12/15/2018  . Hypertension     Past Surgical History:  Procedure Laterality Date  . JOINT REPLACEMENT    . VIDEO BRONCHOSCOPY WITH ENDOBRONCHIAL ULTRASOUND N/A 12/16/2018   Procedure: VIDEO  BRONCHOSCOPY WITH ENDOBRONCHIAL ULTRASOUND AND FLUROSCOPY;  Surgeon: [name], MD;  Location: [facility];  Service: Pulmonary;  Laterality: N/A;        Home Medications    Prior to Admission medications   Medication Sig Start Date End Date Taking? Authorizing Provider  ferrous sulfate 325 (65 FE) MG tablet Take 325 mg by mouth 2 (two) times daily with a meal.    [provider]  midodrine (PROAMATINE) 5 MG tablet Take 1 tablet (5 mg total) by mouth 2 (two) times daily with a meal. Patient not taking: Reported on 05/02/2019 02/11/19   Aline August, MD  Multiple Vitamin (MULTIVITAMIN) tablet Take 1 tablet by mouth daily.    [provider]  prochlorperazine (COMPAZINE) 10 MG tablet Take 1 tablet (10 mg total) by mouth every 6 (six) hours as needed for nausea or vomiting. 01/12/19   Heilingoetter, Cassandra L, PA-C  rivaroxaban (XARELTO) 20 MG TABS tablet Take 1 tablet (20 mg total) by mouth daily with supper. 04/20/19   Curt Bears, MD    Family History Family History  Problem Relation Age of Onset  . Stomach cancer Mother   . COPD Father   . Lung cancer Brother   . Lung cancer Brother     Social History Social History   Tobacco Use  . Smoking status: Current Some Day Smoker    Packs/day: 1.00    Years: 60.00    Pack  years: 60.00    Types: Cigarettes  . Smokeless tobacco: Never Used  Substance Use Topics  . Alcohol use: Yes    Comment: occasional  . Drug use: No     Allergies   Patient has no known allergies.   Review of Systems Review of Systems  Unable to perform ROS: Dementia     Physical Exam Updated Vital Signs BP 106/77   Pulse (!) 107   Temp (!) 102.5 F (39.2 C) (Rectal)   Resp (!) 21   SpO2 95%   Physical Exam Vitals signs and nursing note reviewed.  Constitutional:      Appearance: He is well-developed. He is ill-appearing.  HENT:     Head: Normocephalic and atraumatic.     Right Ear: External ear normal.     Left  Ear: External ear normal.     Nose: Nose normal.  Eyes:     General:        Right eye: No discharge.        Left eye: No discharge.  Neck:     Musculoskeletal: Neck supple.  Cardiovascular:     Rate and Rhythm: Regular rhythm. Tachycardia present.     Heart sounds: Normal heart sounds.  Pulmonary:     Effort: Pulmonary effort is normal.     Breath sounds: Examination of the right-lower field reveals rales. Rales present.  Abdominal:     Palpations: Abdomen is soft.     Tenderness: There is no abdominal tenderness.  Musculoskeletal:     Right lower leg: No edema.     Left lower leg: No edema.  Skin:    General: Skin is warm and dry.  Neurological:     Mental Status: He is alert.  Psychiatric:        Mood and Affect: Mood is not anxious.      ED Treatments / Results  Labs (all labs ordered are listed, but only abnormal results are displayed) Labs Reviewed  LACTIC ACID, PLASMA - Abnormal; Notable for the following components:      Result Value   Lactic Acid, Venous 2.8 (*)    All other components within normal limits  CBC WITH DIFFERENTIAL/PLATELET - Abnormal; Notable for the following components:   Hemoglobin 11.1 (*)    HCT 35.4 (*)    MCV 65.6 (*)    MCH 20.6 (*)    RDW 26.1 (*)    Neutro Abs 8.1 (*)    Lymphs Abs 0.4 (*)    Abs Immature Granulocytes 0.09 (*)    All other components within normal limits  APTT - Abnormal; Notable for the following components:   aPTT 45 (*)    All other components within normal limits  PROTIME-INR - Abnormal; Notable for the following components:   Prothrombin Time 20.9 (*)    INR 1.8 (*)    All other components within normal limits  COMPREHENSIVE METABOLIC PANEL - Abnormal; Notable for the following components:   Glucose, Bld 111 (*)    Calcium 8.8 (*)    Total Protein 6.4 (*)    Albumin 3.0 (*)    All other components within normal limits  CULTURE, BLOOD (ROUTINE X 2)  CULTURE, BLOOD (ROUTINE X 2)  URINE CULTURE  SARS  CORONAVIRUS 2 (TAT 6-24 HRS)  LACTIC ACID, PLASMA  URINALYSIS, ROUTINE W REFLEX MICROSCOPIC  I-STAT CHEM 8, ED  POC SARS CORONAVIRUS 2 AG -  ED  I-STAT BETA HCG BLOOD, ED ([facility], [facility], [facility] ONLY)  EKG EKG Interpretation  Date/Time:  Sunday May 08 2019 14:36:59 EST Ventricular Rate:  133 PR Interval:    QRS Duration: 81 QT Interval:  294 QTC Calculation: 438 R Axis:   80 Text Interpretation: Sinus tachycardia Ventricular premature complex Aberrant complex rate is faster compared to Sept 2020 Confirmed by Sherwood Gambler [phone]) on 05/08/2019 3:10:46 PM   Radiology Dg Chest Port 1 View  Result Date: 05/08/2019 CLINICAL DATA:  Cough and fever EXAM: PORTABLE CHEST 1 VIEW COMPARISON:  03/14/2019 FINDINGS: The lung bases were not fully visualized on this exam. Again noted is a large mass within the right middle lobe with complete collapse of the right middle lobe. This appearance is similar to prior chest x-ray dated 03/14/2019 with some proved aeration in the right middle lobe. The heart size is stable from prior study. There is elevation of the right hemidiaphragm. There is mild shift of the mediastinum to the right. IMPRESSION: 1. Limited study. The lung bases were not fully visualized on this exam. 2. Again noted is a right middle lobe mass with near complete collapse of the right middle lobe. 3. No definite focal infiltrate identified on this exam given the limitations described above. Electronically Signed   By: Constance Holster M.D.   On: 05/08/2019 15:19    Procedures .Critical Care Performed by: Sherwood Gambler, MD Authorized by: Sherwood Gambler, MD   Critical care provider statement:    Critical care time (minutes):  35   Critical care time was exclusive of:  Separately billable procedures and treating other patients   Critical care was necessary to treat or prevent imminent or life-threatening deterioration of the following conditions:  Respiratory failure and sepsis    Critical care was time spent personally by me on the following activities:  Discussions with consultants, evaluation of patient's response to treatment, examination of patient, ordering and performing treatments and interventions, ordering and review of laboratory studies, ordering and review of radiographic studies, pulse oximetry, re-evaluation of patient's condition, obtaining history from patient or surrogate and review of old charts   (including critical care time)  Medications Ordered in ED Medications  ceFEPIme (MAXIPIME) 2 g in sodium chloride 0.9 % 100 mL IVPB (0 g Intravenous Stopped 05/08/19 1710)  acetaminophen (TYLENOL) tablet 650 mg (650 mg Oral Given 05/08/19 1507)  vancomycin (VANCOCIN) 1,250 mg in sodium chloride 0.9 % 250 mL IVPB (1,250 mg Intravenous New Bag/Given 05/08/19 1712)  sodium chloride 0.9 % bolus 1,000 mL (1,000 mLs Intravenous New Bag/Given 05/08/19 1615)  sodium chloride 0.9 % bolus 1,000 mL (0 mLs Intravenous Stopped 05/08/19 1710)     Initial Impression / Assessment and Plan / ED Course  I have reviewed the triage vital signs and the nursing notes.  Pertinent labs & imaging results that were available during my care of the patient were reviewed by me and considered in my medical decision making (see chart for details).        Patient looks better with Tylenol and fluids.  He was given broad IV antibiotics.  Given the cough, concern for respiratory source.  Initial Covid testing is negative and chest x-ray is nonrevealing though somewhat obscured by poor technique and his lung cancer.  Urinalysis is pending.  He will need admission and supportive care.  I discussed his illness with his family listed in the chart and they understand that he is ill and needs hospitalization.  At this point, the niece/POA request that he be full code.  Richard [name]  was evaluated in Emergency Department on 05/08/2019 for the symptoms described in the history of present illness.  He was evaluated in the context of the global COVID-19 pandemic, which necessitated consideration that the patient might be at risk for infection with the SARS-CoV-2 virus that causes COVID-19. Institutional protocols and algorithms that pertain to the evaluation of patients at risk for COVID-19 are in a state of rapid change based on information released by regulatory bodies including the CDC and federal and state organizations. These policies and algorithms were followed during the patient's care in the ED.   Final Clinical Impressions(s) / ED Diagnoses   Final diagnoses:  Severe sepsis [facility])    ED Discharge Orders    None       Sherwood Gambler, MD 05/08/19 1914

## 2019-05-08 NOTE — ED Triage Notes (Signed)
His family report pt. To be short of breath (more than usual) x ~ 1 week. He arrives on a non-rebreather and is short of breath, but in no distress.

## 2019-05-08 NOTE — Progress Notes (Signed)
Notified bedside nurse of need to draw lactic acid.  Secure chat sent to bedside RN asking if she could obtain a second lactic acid on this patient. Upon reviewing the chart it appears the order is already there and awaiting to be drawn.

## 2019-05-08 NOTE — Progress Notes (Addendum)
Notified bedside nurse of need to administer antibiotics.  Called department to in attempt to reach beside RN at 1615 to see if there was a barrier to antibiotic being since they had been ordered for over an hour. I was notified that the RN was in a contact room, I was transferred to charge RN but no one answered the phone. I then sent a secure chat to assigned RN to this patient to help any barriers that could be preventing antibiotic from being given.   Beside RN replied back pt is a difficult IV stick and an ultrasound had to be used to place IV causing delay in abx.

## 2019-05-08 NOTE — H&P (Signed)
History and Physical   Richard [name] MEQ:[id number] DOB: [birth year] DOA: 05/08/2019  Referring MD/NP/PA: Dr. Verta Ellen  PCP: [name], NP   Outpatient Specialists: None  Patient coming from: Home  Chief Complaint: Shortness of breath  HPI: Richard [name] is a 78 y.o. male with medical history significant of long mass suspected to be cancer, dementia, hypertension, hyperlipidemia but no fever who has had progressive cough for about a week short of breath in the last few days and came to the ER where he was found to have fever tachycardia hypotension and symptoms consistent with SIRS.  Urinalysis negative but suspected sepsis probably due to early pneumonia.  Patient was lactic acidosis as well.  He is a poor historian.  History has been obtained from daughter over the phone but still sketchy.  He is being admitted to the hospital at this point with a diagnosis of sepsis for work-up..  ED Course: Temperature is 102.5 blood pressure 92/54 pulse 136 respiratory rate of 36 oxygen sats 79% on room air currently 96% on 2 L.  White count 8.9 hemoglobin 11.1 and platelets 382.  Chemistry essentially within normal except for calcium of 8.8 lactic acid 2.8.  PT/INR is 20.9 and 1.8.  Chest x-ray was limited showing right middle lobe mass with near complete collapse of the right middle lobe otherwise no clear-cut infiltrates.  Urinalysis negative.  Patient is therefore being admitted with sepsis of unknown cause.  Review of Systems: As per HPI otherwise 10 point review of systems negative.    Past Medical History:  Diagnosis Date  . Dementia ([place])   . High cholesterol   . Hypertension   . Lung mass 12/2018    Past Surgical History:  Procedure Laterality Date  . JOINT REPLACEMENT    . VIDEO BRONCHOSCOPY WITH ENDOBRONCHIAL ULTRASOUND N/A 12/16/2018   Procedure: VIDEO BRONCHOSCOPY WITH ENDOBRONCHIAL ULTRASOUND AND FLUROSCOPY;  Surgeon: [name], MD;  Location: [facility];  Service:  Pulmonary;  Laterality: N/A;     reports that he has been smoking cigarettes. He has a 60.00 pack-year smoking history. He has never used smokeless tobacco. He reports current alcohol use. He reports that he does not use drugs.  No Known Allergies  Family History  Problem Relation Age of Onset  . Stomach cancer Mother   . COPD Father   . Lung cancer Brother   . Lung cancer Brother      Prior to Admission medications   Medication Sig Start Date End Date Taking? Authorizing Provider  ferrous sulfate 325 (65 FE) MG tablet Take 325 mg by mouth 2 (two) times daily with a meal.   Yes [provider]  Multiple Vitamin (MULTIVITAMIN) tablet Take 1 tablet by mouth daily.   Yes [provider]  Omega-3 Fatty Acids (FISH OIL) 1200 MG CAPS Take 1,200 mg by mouth daily.   Yes [provider]  prochlorperazine (COMPAZINE) 10 MG tablet Take 1 tablet (10 mg total) by mouth every 6 (six) hours as needed for nausea or vomiting. 01/12/19  Yes Heilingoetter, Cassandra L, PA-C  rivaroxaban (XARELTO) 20 MG TABS tablet Take 1 tablet (20 mg total) by mouth daily with supper. 04/20/19  Yes Curt Bears, MD  midodrine (PROAMATINE) 5 MG tablet Take 1 tablet (5 mg total) by mouth 2 (two) times daily with a meal. Patient not taking: Reported on 05/02/2019 02/11/19   Aline August, MD    Physical Exam: Vitals:   05/08/19 2030 05/08/19 2130 05/08/19 2200 05/08/19 2243  BP: 118/64 (!) 109/59 115/68 (!) 107/55  Pulse: (!) 105 (!) 101 98 100  Resp: 20 (!) 21 (!) 23 16  Temp:   98.9 F (37.2 C) 97.9 F (36.6 C)  TempSrc:    Oral  SpO2: 97% 95% 93% 96%      Constitutional: Acutely ill looking with cachexia Vitals:   05/08/19 2030 05/08/19 2130 05/08/19 2200 05/08/19 2243  BP: 118/64 (!) 109/59 115/68 (!) 107/55  Pulse: (!) 105 (!) 101 98 100  Resp: 20 (!) 21 (!) 23 16  Temp:   98.9 F (37.2 C) 97.9 F (36.6 C)  TempSrc:    Oral  SpO2: 97% 95% 93% 96%   Eyes: PERRL, lids  and conjunctivae normal ENMT: Mucous membranes are moist. Posterior pharynx clear of any exudate or lesions.adentulous Neck: normal, supple, no masses, no thyromegaly Respiratory: clear to auscultation bilaterally, no wheezing, no crackles. Normal respiratory effort. No accessory muscle use.  Cardiovascular: Irregularly irregular rate and rhythm, no murmurs / rubs / gallops. No extremity edema. 2+ pedal pulses. No carotid bruits.  Abdomen: no tenderness, no masses palpated. No hepatosplenomegaly. Bowel sounds positive.  Musculoskeletal: no clubbing / cyanosis. No joint deformity upper and lower extremities. Good ROM, no contractures.  Loss of muscle tone Skin: no rashes, lesions, ulcers. No induration Neurologic: CN 2-12 grossly intact. Sensation intact, DTR normal. Strength 5/5 in all 4.  Psychiatric: Normal judgment and insight. Alert and oriented x 3. Normal mood.     Labs on Admission: I have personally reviewed following labs and imaging studies  CBC: Recent Labs  Lab 05/02/19 0825 05/08/19 1538  WBC 4.5 8.9  NEUTROABS 3.0 8.1*  HGB 11.6* 11.1*  HCT 35.7* 35.4*  MCV 64.3* 65.6*  PLT 336 852   Basic Metabolic Panel: Recent Labs  Lab 05/02/19 0825 05/08/19 1740  NA 138 138  K 4.9 4.8  CL 102 107  CO2 23 22  GLUCOSE 121* 111*  BUN 11 21  CREATININE 0.78 0.87  CALCIUM 9.4 8.8*   GFR: Estimated Creatinine Clearance: 66.8 mL/min (by C-G formula based on SCr of 0.87 mg/dL). Liver Function Tests: Recent Labs  Lab 05/02/19 0825 05/08/19 1740  AST 19 28  ALT 18 22  ALKPHOS 116 81  BILITOT 0.3 0.5  PROT 7.0 6.4*  ALBUMIN 3.1* 3.0*   No results for input(s): LIPASE, AMYLASE in the last 168 hours. No results for input(s): AMMONIA in the last 168 hours. Coagulation Profile: Recent Labs  Lab 05/08/19 1538  INR 1.8*   Cardiac Enzymes: No results for input(s): CKTOTAL, CKMB, CKMBINDEX, TROPONINI in the last 168 hours. BNP (last 3 results) No results for input(s):  PROBNP in the last 8760 hours. HbA1C: No results for input(s): HGBA1C in the last 72 hours. CBG: No results for input(s): GLUCAP in the last 168 hours. Lipid Profile: No results for input(s): CHOL, HDL, LDLCALC, TRIG, CHOLHDL, LDLDIRECT in the last 72 hours. Thyroid Function Tests: No results for input(s): TSH, T4TOTAL, FREET4, T3FREE, THYROIDAB in the last 72 hours. Anemia Panel: No results for input(s): VITAMINB12, FOLATE, FERRITIN, TIBC, IRON, RETICCTPCT in the last 72 hours. Urine analysis:    Component Value Date/Time   COLORURINE YELLOW 05/08/2019 1844   APPEARANCEUR HAZY (A) 05/08/2019 1844   LABSPEC 1.016 05/08/2019 1844   PHURINE 8.0 05/08/2019 1844   GLUCOSEU NEGATIVE 05/08/2019 1844   HGBUR NEGATIVE 05/08/2019 1844   BILIRUBINUR NEGATIVE 05/08/2019 1844   KETONESUR NEGATIVE 05/08/2019 1844   PROTEINUR NEGATIVE 05/08/2019  [facility] 05/08/2019 1844   LEUKOCYTESUR NEGATIVE 05/08/2019 1844   Sepsis Labs: @LABRCNTIP (procalcitonin:4,lacticidven:4) )No results found for this or any previous visit (from the past 240 hour(s)).   Radiological Exams on Admission: Dg Chest Port 1 View  Result Date: 05/08/2019 CLINICAL DATA:  Cough and fever EXAM: PORTABLE CHEST 1 VIEW COMPARISON:  03/14/2019 FINDINGS: The lung bases were not fully visualized on this exam. Again noted is a large mass within the right middle lobe with complete collapse of the right middle lobe. This appearance is similar to prior chest x-ray dated 03/14/2019 with some proved aeration in the right middle lobe. The heart size is stable from prior study. There is elevation of the right hemidiaphragm. There is mild shift of the mediastinum to the right. IMPRESSION: 1. Limited study. The lung bases were not fully visualized on this exam. 2. Again noted is a right middle lobe mass with near complete collapse of the right middle lobe. 3. No definite focal infiltrate identified on this exam given the limitations  described above. Electronically Signed   By: Constance Holster M.D.   On: 05/08/2019 15:19      Assessment/Plan Principal Problem:   Sepsis ([place]) Active Problems:   Lung mass   Hypertension   Dementia without behavioral disturbance ([facility])   Atrial fibrillation with RVR ([place])     #1 sepsis: Source not very clear.  Could be early pneumonia based on patient's lung findings and the fact that he complained of shortness of breath.  May benefit from his CT chest to further evaluate.  Continue empiric antibiotics for sepsis including Vanco and cefepime.  #2 dementia: No behavioral changes.  Continue supportive care  #3 hypertension: Continue with blood pressure control.  #4 atrial fibrillation: Rate is controlled.  Continue home regimen  #5 lung cancer: Patient has stage IIIa non-small cell lung cancer with squamous cell carcinoma in the middle lobe.  He has had postobstructive pneumonia in the past since July of this year.  Patient has had concurrent chemo radiation with carboplatin base agents.  Follow-up outpatient with oncology   DVT prophylaxis: Lovenox Code Status: DNR Family Communication: Daughter over the phone Disposition Plan: Home Consults called: None Admission status: Inpatient  Severity of Illness: The appropriate patient status for this patient is INPATIENT. Inpatient status is judged to be reasonable and necessary in order to provide the required intensity of service to ensure the patient's safety. The patient's presenting symptoms, physical exam findings, and initial radiographic and laboratory data in the context of their chronic comorbidities is felt to place them at high risk for further clinical deterioration. Furthermore, it is not anticipated that the patient will be medically stable for discharge from the hospital within 2 midnights of admission. The following factors support the patient status of inpatient.   " The patient's presenting symptoms include shortness  of breath and fever. " The worrisome physical exam findings include coarse breath sounds with cachexia. " The initial radiographic and laboratory data are worrisome because of lactic acidosis. " The chronic co-morbidities include lung cancer.   * I certify that at the point of admission it is my clinical judgment that the patient will require inpatient hospital care spanning beyond 2 midnights from the point of admission due to high intensity of service, high risk for further deterioration and high frequency of surveillance required.Barbette Merino MD [facility] Pager [phone]  If 7PM-7AM, please contact night-coverage [email or web address] Password [facility]  05/08/2019,  10:55 PM

## 2019-05-08 NOTE — Progress Notes (Signed)
A consult was received from an ED physician for vancomycin & cefepime per pharmacy dosing.  The patient's profile has been reviewed for ht/wt/allergies/indication/available labs.   A one time order has been placed for vancomycin 1250 mg and cefepime 2 gm IV x 1.   Further antibiotics/pharmacy consults should be ordered by admitting physician if indicated.                       Thank you,  Eudelia Bunch, Pharm.D [phone] 05/08/2019 2:57 PM

## 2019-05-09 ENCOUNTER — Ambulatory Visit
Admission: RE | Admit: 2019-05-09 | Discharge: 2019-05-09 | Disposition: A | Discharge status: Home / Self Care | Payer: Medicare HMO | Source: Ambulatory Visit | Attending: Radiation Oncology | Admitting: Radiation Oncology

## 2019-05-09 ENCOUNTER — Other Ambulatory Visit: Payer: Self-pay

## 2019-05-09 ENCOUNTER — Ambulatory Visit: Payer: Medicare HMO

## 2019-05-09 ENCOUNTER — Encounter (HOSPITAL_COMMUNITY): Payer: Self-pay | Admitting: *Deleted

## 2019-05-09 LAB — CREATININE, SERUM
Creatinine, Ser: 0.58 mg/dL — ABNORMAL LOW (ref 0.61–1.24)
GFR calc Af Amer: >60 mL/min (ref >60)
GFR calc non Af Amer: >60 mL/min (ref >60)

## 2019-05-09 LAB — SARS CORONAVIRUS 2 (TAT 6-24 HRS): SARS Coronavirus 2: NEGATIVE

## 2019-05-09 MED ORDER — FERROUS SULFATE 325 (65 FE) MG PO TABS
325.0000 mg | ORAL_TABLET | Freq: Two times a day (BID) | ORAL | Status: DC
  Administered 2019-05-09 – 2019-05-10 (×2): 325 mg via ORAL
  Filled 2019-05-09 (×2): qty 1

## 2019-05-09 MED ORDER — RIVAROXABAN 20 MG PO TABS
20.0000 mg | ORAL_TABLET | Freq: Every day | ORAL | Status: DC
  Administered 2019-05-09: 20 mg via ORAL
  Filled 2019-05-09: qty 1

## 2019-05-09 MED ORDER — VANCOMYCIN HCL 10 G IV SOLR
1250.0000 mg | INTRAVENOUS | Status: DC
  Administered 2019-05-09: 1250 mg via INTRAVENOUS
  Filled 2019-05-09 (×2): qty 1250

## 2019-05-09 MED ORDER — SODIUM CHLORIDE 0.9 % IV SOLN
INTRAVENOUS | Status: AC
  Administered 2019-05-09: 13:00:00 via INTRAVENOUS

## 2019-05-09 MED ORDER — SODIUM CHLORIDE 0.9 % IV SOLN
2.0000 g | Freq: Three times a day (TID) | INTRAVENOUS | Status: DC
  Administered 2019-05-09 – 2019-05-10 (×4): 2 g via INTRAVENOUS
  Filled 2019-05-09 (×5): qty 2

## 2019-05-09 MED ORDER — ENSURE ENLIVE PO LIQD
237.0000 mL | Freq: Two times a day (BID) | ORAL | Status: DC
  Administered 2019-05-09 – 2019-05-10 (×2): 237 mL via ORAL

## 2019-05-09 MED ORDER — ADULT MULTIVITAMIN W/MINERALS CH
1.0000 | ORAL_TABLET | Freq: Every day | ORAL | Status: DC
  Administered 2019-05-09 – 2019-05-10 (×2): 1 via ORAL
  Filled 2019-05-09 (×2): qty 1

## 2019-05-09 MED ORDER — SODIUM CHLORIDE 0.9 % IV SOLN
2.0000 g | INTRAVENOUS | Status: AC
  Administered 2019-05-09: 2 g via INTRAVENOUS
  Filled 2019-05-09: qty 2

## 2019-05-09 NOTE — Progress Notes (Signed)
Initial Nutrition Assessment  DOCUMENTATION CODES:   Underweight  INTERVENTION:  -Ensure Enlive po BID, each supplement provides 350 kcal and 20 grams of protein  -Magic cup TID with meals, each supplement provides 290 kcal and 9 grams of protein  -MVI with minerals daily  NUTRITION DIAGNOSIS:   Increased nutrient needs(calories and protein) related to acute illness, chronic illness(sepsis of unknown etiology; right middle lobe mass on CXR with h/o right lung cancer) as evidenced by estimated needs, percent weight loss(underweight).  GOAL:   Patient will meet greater than or equal to 90% of their needs, Weight gain  MONITOR:   PO intake, Labs, I & O's, Supplement acceptance, Weight trends  REASON FOR ASSESSMENT:   Malnutrition Screening Tool    ASSESSMENT:  RD working remotely.  78 year old male with medical history significant of stage III squamous cell carcinoma of right lung, pelvic mass, dementia, HTN, HLD, a-fib who presented to ED with complaints of progressive cough and shortness of breath over the past week. In ED patient found to have fever, tachycardia, and hypotension with elevated lactic acid and admitted with sepsis suspected secondary to early pneumonia.  CXR showed right middle lobe mass with near complete collapse of right middle lobe, urinalysis negative. Patient admitted with sepsis of unknown cause.   01-23-10/16 -completed 9 of 9 cycles; Carboplatin/Paclitaxel + XRT q7d  Per chart review, patient is a poor historian due to dementia. Hospitalist noted that history obtained from daughter over the phone "still sketchy" Patient seen by outpatient oncology RD during chemotherapy/radiation treatment in August. At that time daughter of patient reported he was not eating much on his own, but would eat small amounts if his family put it in front of him. Patient was experiencing depression at that time due to the loss of his twin. Patient amenable to ONS; RD provided  complimentary case of Ensure to assist with reported financial burden.   No recorded meals at this time, patient on floor < 24 hrs. Will continue to monitor for po intake of meals and supplements. Highly suspect mod/severe malnutrition given patient is currently underweight, recent severe weight loss, and history of present illness. Physical exam to assess for fat and muscle depletion to be completed at follow up. Patient would benefit from nutrition supplement. RD will provide Ensure and Magic Cup with meals to aid with calorie and protein needs.   Weight history reviewed, trending down over the past 4 months. Noted 10 lb (6.7%) wt loss in the past month which is severe for time frame. On 8/17 pt wt 68.1 kg (149.8 lbs), 9/24 pt wt 66.7 kg (146.7 lb), 10/8 pt wt 68 kg (149.6 lb)  UBW 174 lbs per chart review  Medications reviewed and include: Ferrous sulfate, MVI with minerals NaCl Maxipime Vancomycin Labs:   NUTRITION - FOCUSED PHYSICAL EXAM: Unable to complete at this time, RD working remotely.   Diet Order:   Diet Order            DIET SOFT Room service appropriate? No; Fluid consistency: Thin  Diet effective now              EDUCATION NEEDS:   No education needs have been identified at this time  Skin:  Skin Assessment: Reviewed RN Assessment  Last BM:  11/22 (type 2;small;brown)  Height:   Ht Readings from Last 1 Encounters:  05/08/19 6\' 2"  (1.88 m)    Weight:   Wt Readings from Last 1 Encounters:  05/08/19 63.6  kg    Ideal Body Weight:  86.4 kg  BMI:  Body mass index is 18 kg/m.  Estimated Nutritional Needs:   Kcal:  1900-2100 (30-33 g/kg)  Protein:  95-105  Fluid:  1.9 L/day   Lajuan Lines, RD, LDN Clinical Nutrition [facility] Telephone [phone] After Hours/Weekend Pager: [phone]

## 2019-05-09 NOTE — Progress Notes (Signed)
Pharmacy Antibiotic Note  Zong Goswami is a 78 y.o. male admitted on 05/08/2019 with sepsis.  Pharmacy has been consulted for Vancomycin and Cefepime dosing.  Plan: Vancomycin 1250mg  IV q24h. Vancomycin levels at steady state, as indicated. Cefepime 2g IV q8h. Monitor renal function, cultures, clinical course.   Height: 6\' 2"  (188 cm) Weight: 140 lb 3.4 oz (63.6 kg) IBW/kg (Calculated) : 82.2  Temp (24hrs), Avg:99.8 F (37.7 C), Min:97.9 F (36.6 C), Max:102.5 F (39.2 C)  Recent Labs  Lab 05/02/19 0825 05/08/19 1538 05/08/19 1540 05/08/19 1740 05/08/19 1844  WBC 4.5 8.9  --   --   --   CREATININE 0.78  --   --  0.87  --   LATICACIDVEN  --   --  2.8*  --  1.6    Estimated Creatinine Clearance: 65 mL/min (by C-G formula based on SCr of 0.87 mg/dL).    No Known Allergies  Antimicrobials this admission: 11/22 Vancomycin >> 11/22 Cefepime >>  Dose adjustments this admission: --  Microbiology results: 11/22 BCx: sent 11/22 UCx: sent 11/22 COVID: negative  Thank you for allowing pharmacy to be a part of this patient's care.  Luiz Ochoa 05/09/2019 3:05 AM

## 2019-05-09 NOTE — Evaluation (Signed)
Physical Therapy Evaluation Patient Details Name: Richard [name] MRN: [id number] DOB: 1941-06-07 Today's Date: 05/09/2019   History of Present Illness  78 yo male admitted with sepsis. Hx of dementia, lung ca, Afib.  Clinical Impression  On eval, pt required Min assist for mobility. He walked ~15'x2 (to and from bathroom) while holding on to furniture/surrounding items for support. He fatigues easily with minimal activity. Dyspnea 2/4. He is unsteady and at risk for falls. No family present during session. Will follow and progress activity as tolerated.     Follow Up Recommendations Supervision/Assistance - 24 hour;Home health PT    Equipment Recommendations  (RW-if pt and family are agreeable)    Recommendations for Other Services OT consult     Precautions / Restrictions Precautions Precautions: Fall Restrictions Weight Bearing Restrictions: No      Mobility  Bed Mobility Overal bed mobility: Needs Assistance Bed Mobility: Supine to Sit;Sit to Supine     Supine to sit: Supervision Sit to supine: Supervision      Transfers Overall transfer level: Needs assistance   Transfers: Sit to/from Stand;Stand Pivot Transfers Sit to Stand: Min guard Stand pivot transfers: Min guard       General transfer comment: Stand pivot, bed to recliner.  Ambulation/Gait   Gait Distance (Feet): 15 Feet(x2) Assistive device: IV Pole Gait Pattern/deviations: Step-through pattern;Decreased stride length     General Gait Details: Pt declined RW use. "Furniture surfed" mostly. Fatigues easily with minimal activity. Unsteady.  Stairs            Wheelchair Mobility    Modified Rankin (Stroke Patients Only)       Balance Overall balance assessment: Needs assistance           Standing balance-Leahy Scale: Fair                               Pertinent Vitals/Pain Pain Assessment: No/denies pain    Home Living Family/patient expects to be discharged to::  Private residence Living Arrangements: Children Available Help at Discharge: Family Type of Home: House Home Access: Stairs to enter   Technical brewer of Steps: 2-3 Home Layout: Two level Home Equipment: None Additional Comments: Information gained from previous admission.    Prior Function     Gait / Transfers Assistance Needed: pt states he doesn't use a RW. (unsure of reliability)           Hand Dominance        Extremity/Trunk Assessment   Upper Extremity Assessment Upper Extremity Assessment: Generalized weakness    Lower Extremity Assessment Lower Extremity Assessment: Generalized weakness    Cervical / Trunk Assessment Cervical / Trunk Assessment: Kyphotic  Communication   Communication: No difficulties  Cognition Arousal/Alertness: Awake/alert Behavior During Therapy: WFL for tasks assessed/performed Overall Cognitive Status: History of cognitive impairments - at baseline                                        General Comments      Exercises     Assessment/Plan    PT Assessment Patient needs continued PT services  PT Problem List Decreased strength;Decreased mobility;Decreased activity tolerance;Decreased balance       PT Treatment Interventions DME instruction;Gait training;Therapeutic exercise;Therapeutic activities;Patient/family education;Balance training;Functional mobility training    PT Goals (Current goals can be found in the Care Plan  section)  Acute Rehab PT Goals Patient Stated Goal: none stated PT Goal Formulation: With patient Time For Goal Achievement: 05/23/19 Potential to Achieve Goals: Fair    Frequency Min 3X/week   Barriers to discharge        Co-evaluation               AM-PAC PT "6 Clicks" Mobility  Outcome Measure Help needed turning from your back to your side while in a flat bed without using bedrails?: A Little Help needed moving from lying on your back to sitting on the side of  a flat bed without using bedrails?: A Little Help needed moving to and from a bed to a chair (including a wheelchair)?: A Little Help needed standing up from a chair using your arms (e.g., wheelchair or bedside chair)?: A Little Help needed to walk in hospital room?: A Little Help needed climbing 3-5 steps with a railing? : A Lot 6 Click Score: 17    End of Session   Activity Tolerance: Patient limited by fatigue Patient left: in bed;with call bell/phone within reach;with bed alarm set   PT Visit Diagnosis: Unsteadiness on feet (R26.81);Muscle weakness (generalized) (M62.81);Difficulty in walking, not elsewhere classified (R26.2)    Time: 7782-4235 PT Time Calculation (min) (ACUTE ONLY): 17 min   Charges:   PT Evaluation $PT Eval Moderate Complexity: [place], PT Acute Rehabilitation Services Pager: [phone] Office: [phone]

## 2019-05-09 NOTE — Progress Notes (Addendum)
PROGRESS NOTE    Richard [name]  FWY:[id number] DOB: 09/03/1940 DOA: 05/08/2019 PCP: [name], NP   Brief Narrative:  Patient is a 78 year old male with history of squamous cell carcinoma of the right lung, dementia, hypertension, hyperlipidemia who presented with progressive cough, shortness of breath for a week.  When he presented to the emergency department he was febrile, tachycardic, hypotensive.  Also had lactic acidosis.  Fever was up to 102F.  Patient was started on broad-spectrum antibiotics.  Blood cultures have been sent. He was admitted and discharged on August/2020 during which he was found to have postobstructive pneumonia, pulmonary embolism with right heart strain.  He also developed A. fib with RVR at the time.  Assessment & Plan:   Principal Problem:   Sepsis ([place]) Active Problems:   Lung mass   Hypertension   Dementia without behavioral disturbance ([place])   Atrial fibrillation with RVR ([facility])    Suspected sepsis: Presented with fever, tachycardia, hypotension.  Chest x-rayright middle lobe mass with near complete collapse of the right middle lobe.No definite focal infiltrate.  Continue broad-spectrum antibiotics for now.  Continue to follow blood cultures.  Stage IIIA(T3, N2, M0) non-small cell lung cancer, squamous cell carcinoma : Status post concurrent chemoradiation.  Status post 8 cycles.  Follows with Dr. Julien Nordmann.  Plan for restaging scans. Imagings here showed right middle lobe mass with near complete collapse of the right middle lobe. No definite focal infiltrate   Hypertension: Patient is currently hypotensive.  He was also on midodrine at home for hypotension which is not taking.  Continue gentle IV fluids.  History of A. Fib/history of PE: Currently in normal sinus rhythm.  Currently rate is controlled.  Continue Xarelto for anticoagulation.  History of dementia: Continue supportive care  Microcytic anemia: Continue iron supplementation.   Currently H&H is stable.  Moderate to severe protein calorie moderation: We will request for dietitian evaluation.  Debility/deconditioning: We will request for PT/OT evaluation.         DVT prophylaxis:Xarelto Code Status: Full Family Communication: Daughter daughter on phone Disposition Plan: Likely home tomorrow   Consultants: None  Procedures: None  Antimicrobials:  Anti-infectives (From admission, onward)   Start     Dose/Rate Route Frequency Ordered Stop   05/09/19 1600  vancomycin (VANCOCIN) 1,250 mg in sodium chloride 0.9 % 250 mL IVPB     1,250 mg 166.7 mL/hr over 90 Minutes Intravenous Every 24 hours 05/09/19 0337     05/09/19 1200  ceFEPIme (MAXIPIME) 2 g in sodium chloride 0.9 % 100 mL IVPB     2 g 200 mL/hr over 30 Minutes Intravenous Every 8 hours 05/09/19 0305     05/09/19 0315  ceFEPIme (MAXIPIME) 2 g in sodium chloride 0.9 % 100 mL IVPB     2 g 200 mL/hr over 30 Minutes Intravenous STAT 05/09/19 0304 05/09/19 0346   05/08/19 1500  vancomycin (VANCOCIN) IVPB 1000 mg/200 mL premix  Status:  Discontinued     1,000 mg 200 mL/hr over 60 Minutes Intravenous  Once 05/08/19 1448 05/08/19 1452   05/08/19 1500  ceFEPIme (MAXIPIME) 2 g in sodium chloride 0.9 % 100 mL IVPB     2 g 200 mL/hr over 30 Minutes Intravenous  Once 05/08/19 1448 05/08/19 1710   05/08/19 1500  vancomycin (VANCOCIN) 1,250 mg in sodium chloride 0.9 % 250 mL IVPB     1,250 mg 166.7 mL/hr over 90 Minutes Intravenous  Once 05/08/19 1452 05/08/19 1842  Subjective:  Patient seen and examined the bedside this morning.  Blood pressure little soft but hemodynamically stable.  Comfortable.  Denies any complaints.  Currently on room air.  Does not have any respiratory distress, cough.  Objective: Vitals:   05/08/19 2200 05/08/19 2243 05/08/19 2256 05/09/19 0453  BP: 115/68 (!) 107/55  (!) 98/50  Pulse: 98 100  97  Resp: (!) 23 16  18   Temp: 98.9 F (37.2 C) 97.9 F (36.6 C)  98 F (36.7  C)  TempSrc:  Oral  Oral  SpO2: 93% 96%  96%  Weight:   63.6 kg   Height:   6\' 2"  (1.88 m)    No intake or output data in the 24 hours ending 05/09/19 1251 Filed Weights   05/08/19 2256  Weight: 63.6 kg    Examination:  General exam: Appears calm and comfortable ,Not in distress,thin HEENT:PERRL,Oral mucosa moist, Ear/Nose normal on gross exam Respiratory system: Bilateral equal air entry, normal vesicular breath sounds, no wheezes or crackles  Cardiovascular system: S1 & S2 heard, RRR. No JVD, murmurs, rubs, gallops or clicks. No pedal edema. Gastrointestinal system: Abdomen is nondistended, soft and nontender. No organomegaly or masses felt. Normal bowel sounds heard. Central nervous system: Alert and oriented. No focal neurological deficits. Extremities: No edema, no clubbing ,no cyanosis, distal peripheral pulses palpable. Skin: No rashes, lesions or ulcers,no icterus ,no pallor    Data Reviewed: I have personally reviewed following labs and imaging studies  CBC: Recent Labs  Lab 05/08/19 1538  WBC 8.9  NEUTROABS 8.1*  HGB 11.1*  HCT 35.4*  MCV 65.6*  PLT 938   Basic Metabolic Panel: Recent Labs  Lab 05/08/19 1740 05/09/19 0431  NA 138  --   K 4.8  --   CL 107  --   CO2 22  --   GLUCOSE 111*  --   BUN 21  --   CREATININE 0.87 0.58*  CALCIUM 8.8*  --    GFR: Estimated Creatinine Clearance: 70.7 mL/min (A) (by C-G formula based on SCr of 0.58 mg/dL (L)). Liver Function Tests: Recent Labs  Lab 05/08/19 1740  AST 28  ALT 22  ALKPHOS 81  BILITOT 0.5  PROT 6.4*  ALBUMIN 3.0*   No results for input(s): LIPASE, AMYLASE in the last 168 hours. No results for input(s): AMMONIA in the last 168 hours. Coagulation Profile: Recent Labs  Lab 05/08/19 1538  INR 1.8*   Cardiac Enzymes: No results for input(s): CKTOTAL, CKMB, CKMBINDEX, TROPONINI in the last 168 hours. BNP (last 3 results) No results for input(s): PROBNP in the last 8760 hours. HbA1C:  No results for input(s): HGBA1C in the last 72 hours. CBG: No results for input(s): GLUCAP in the last 168 hours. Lipid Profile: No results for input(s): CHOL, HDL, LDLCALC, TRIG, CHOLHDL, LDLDIRECT in the last 72 hours. Thyroid Function Tests: No results for input(s): TSH, T4TOTAL, FREET4, T3FREE, THYROIDAB in the last 72 hours. Anemia Panel: No results for input(s): VITAMINB12, FOLATE, FERRITIN, TIBC, IRON, RETICCTPCT in the last 72 hours. Sepsis Labs: Recent Labs  Lab 05/08/19 1540 05/08/19 1844  LATICACIDVEN 2.8* 1.6    Recent Results (from the past 240 hour(s))  Blood Culture (routine x 2)     Status: None (Preliminary result)   Collection Time: 05/08/19  3:33 PM   Specimen: BLOOD  Result Value Ref Range Status   Specimen Description   Final    BLOOD BLOOD RIGHT FOREARM Performed at [facility],  [place] [address] [place], [place] [address]    Special Requests   Final    BOTTLES DRAWN AEROBIC AND ANAEROBIC Blood Culture results may not be optimal due to an excessive volume of blood received in culture bottles Performed at [facility] [address], [place], [place] [address]    Culture   Final    NO GROWTH < 12 HOURS Performed at [facility] [address] [place], [place] [address]    Report Status PENDING  Incomplete  Blood Culture (routine x 2)     Status: None (Preliminary result)   Collection Time: 05/08/19  5:01 PM   Specimen: BLOOD  Result Value Ref Range Status   Specimen Description   Final    BLOOD LEFT WRIST Performed at [facility] [address], [place], [place] [address]    Special Requests   Final    BOTTLES DRAWN AEROBIC AND ANAEROBIC Blood Culture adequate volume Performed at [facility] [address], [place], [place] [address]    Culture   Final    NO GROWTH < 12 HOURS Performed at [facility] [address] [place], [place] [address]     Report Status PENDING  Incomplete  SARS CORONAVIRUS 2 (TAT 6-24 HRS) Nasopharyngeal Nasopharyngeal Swab     Status: None   Collection Time: 05/08/19  5:34 PM   Specimen: Nasopharyngeal Swab  Result Value Ref Range Status   SARS Coronavirus 2 NEGATIVE NEGATIVE Final    Comment: (NOTE) SARS-CoV-2 target nucleic acids are NOT DETECTED. The SARS-CoV-2 RNA is generally detectable in upper and lower respiratory specimens during the acute phase of infection. Negative results do not preclude SARS-CoV-2 infection, do not rule out co-infections with other pathogens, and should not be used as the sole basis for treatment or other patient management decisions. Negative results must be combined with clinical observations, patient history, and epidemiological information. The expected result is Negative. Fact Sheet for Patients: SugarRoll.be Fact Sheet for Healthcare Providers: https://www.woods-mathews.com/ This test is not yet approved or cleared by the [place] FDA and  has been authorized for detection and/or diagnosis of SARS-CoV-2 by FDA under an Emergency Use Authorization (EUA). This EUA will remain  in effect (meaning this test can be used) for the duration of the COVID-19 declaration under Section 56 4(b)(1) of the Act, 21 U.S.C. section 360bbb-3(b)(1), unless the authorization is terminated or revoked sooner. Performed at [facility], [place] [address], [place],  [address]          Radiology Studies: Dg Chest Port 1 View  Result Date: 05/08/2019 CLINICAL DATA:  Cough and fever EXAM: PORTABLE CHEST 1 VIEW COMPARISON:  03/14/2019 FINDINGS: The lung bases were not fully visualized on this exam. Again noted is a large mass within the right middle lobe with complete collapse of the right middle lobe. This appearance is similar to prior chest x-ray dated 03/14/2019 with some proved aeration in the right middle lobe. The heart  size is stable from prior study. There is elevation of the right hemidiaphragm. There is mild shift of the mediastinum to the right. IMPRESSION: 1. Limited study. The lung bases were not fully visualized on this exam. 2. Again noted is a right middle lobe mass with near complete collapse of the right middle lobe. 3. No definite focal infiltrate identified on this exam given the limitations described above. Electronically Signed   By: Jamie Kato.D.  On: 05/08/2019 15:19        Scheduled Meds: . ferrous sulfate  325 mg Oral BID WC  . multivitamin  1 tablet Oral Daily  . rivaroxaban  20 mg Oral Q supper   Continuous Infusions: . sodium chloride    . ceFEPime (MAXIPIME) IV    . vancomycin       LOS: 1 day    Time spent:25 mins. More than 50% of that time was spent in counseling and/or coordination of care.      Shelly Coss, MD [facility] Pager [phone]  If 7PM-7AM, please contact night-coverage [email or web address] Password [email or web address] 05/09/2019, 12:51 PM

## 2019-05-10 ENCOUNTER — Encounter: Payer: Self-pay | Admitting: Radiation Oncology

## 2019-05-10 ENCOUNTER — Ambulatory Visit
Admission: RE | Admit: 2019-05-10 | Discharge: 2019-05-10 | Disposition: A | Discharge status: Home / Self Care | Payer: Medicare HMO | Source: Ambulatory Visit | Attending: Radiation Oncology | Admitting: Radiation Oncology

## 2019-05-10 MED ORDER — AMOXICILLIN-POT CLAVULANATE 875-125 MG PO TABS: 1.0000 | ORAL_TABLET | Freq: Two times a day (BID) | ORAL | 0 refills | Status: DC

## 2019-05-10 MED ORDER — FOSFOMYCIN TROMETHAMINE 3 G PO PACK
3.0000 g | PACK | Freq: Once | ORAL | Status: AC
  Administered 2019-05-10: 3 g via ORAL
  Filled 2019-05-10: qty 3

## 2019-05-10 MED ORDER — AMOXICILLIN-POT CLAVULANATE 875-125 MG PO TABS: 1.0000 | ORAL_TABLET | Freq: Two times a day (BID) | ORAL | 0 refills | Status: AC

## 2019-05-10 MED FILL — AMOX-CLAV 875-125 MG TABLET: 875-125 | 5 days supply | Qty: 10 | Fill #0

## 2019-05-10 NOTE — TOC Initial Note (Signed)
Transition of Care [facility]) - Initial/Assessment Note    Patient Details  Name: Richard [name] MRN: [id number] Date of Birth: 25-Sep-1940  Transition of Care [facility]) CM/SW Contact:    Wende Neighbors, LCSW Phone Number: 05/10/2019, 12:59 PM  Clinical Narrative:    Patient is being followed by [facility]. CSW requested that [facility] PT OT and RN be resumed by MD. CSW ordered 3 in 1 commode and a rolling walker to be delivered to patients room. Patients daughter is aware of discharge and is agreeable. Daughter stated she will pick patient up from hospital               Expected Discharge Plan: [place] Barriers to Discharge: No Barriers Identified   Patient Goals and CMS Choice        Expected Discharge Plan and Services Expected Discharge Plan: [place] In-house Referral: Clinical Social Work     Living arrangements for the past 2 months: [place] Expected Discharge Date: 05/10/19               DME Arranged: Gilford Rile rolling, 3-N-1 DME Agency: [employer] Date DME Agency Contacted: 05/10/19 Time DME Agency Contacted: 1252 Representative spoke with at DME Agency: [place] Arranged: RN, PT, OT [place] Agency: [place] Date [place]: 05/10/19 Time [place]: 37 Representative spoke with at [place]: cherly  Prior Living Arrangements/Services Living arrangements for the past 2 months: [place] with:: Self Patient language and need for interpreter reviewed:: Yes Do you feel safe going back to the place where you live?: Yes      Need for Family Participation in Patient Care: Yes (Comment) Care giver support system in place?: Yes (comment) Current home services: Home PT, Home RN Criminal Activity/Legal Involvement Pertinent to Current Situation/Hospitalization: No - Comment as needed  Activities of Daily Living Home Assistive Devices/Equipment: None ADL Screening (condition at time of  admission) Patient's cognitive ability adequate to safely complete daily activities?: No Is the patient deaf or have difficulty hearing?: No Does the patient have difficulty seeing, even when wearing glasses/contacts?: Yes Does the patient have difficulty concentrating, remembering, or making decisions?: Yes Patient able to express need for assistance with ADLs?: No Does the patient have difficulty dressing or bathing?: Yes Independently performs ADLs?: No Communication: Needs assistance Is this a change from baseline?: Change from baseline, expected to last >3 days Dressing (OT): Needs assistance Is this a change from baseline?: Change from baseline, expected to last >3 days Grooming: Needs assistance Is this a change from baseline?: Change from baseline, expected to last >3 days Feeding: Needs assistance Is this a change from baseline?: Change from baseline, expected to last >3 days Bathing: Needs assistance Is this a change from baseline?: Change from baseline, expected to last >3 days Toileting: Needs assistance Is this a change from baseline?: Change from baseline, expected to last >3days In/Out Bed: Needs assistance Is this a change from baseline?: Change from baseline, expected to last >3 days Walks in Home: Needs assistance Is this a change from baseline?: Change from baseline, expected to last >3 days Does the patient have difficulty walking or climbing stairs?: Yes Weakness of Legs: Both Weakness of Arms/Hands: None  Permission Sought/Granted Permission sought to share information with : Family Supports Permission granted to share information with : Yes, Verbal Permission Granted  Share Information with NAME: Richard [name],     Permission granted to share  info w Relationship: daughter  Permission granted to share info w Contact Information: [phone]  Emotional Assessment Appearance:: Appears stated age Attitude/Demeanor/Rapport: Unable to Assess Affect (typically  observed): Unable to Assess Orientation: : Oriented to Self, Oriented to  Time Alcohol / Substance Use: Not Applicable Psych Involvement: No (comment)  Admission diagnosis:  Severe sepsis ([place]) [A41.9, R65.20] Patient Active Problem List   Diagnosis Date Noted  . Sepsis ([place]) 05/08/2019  . History of pulmonary embolism 03/30/2019  . Pure hypercholesterolemia 03/30/2019  . SOB (shortness of breath) 03/30/2019  . Atrial fibrillation with RVR ([place])   . Palliative care by specialist   . DNR (do not resuscitate) discussion   . Severe sepsis ([place]) 01/30/2019  . Acute pulmonary embolism ([place]) 01/30/2019  . Acute respiratory failure with hypoxia ([place]) 01/30/2019  . AKI (acute kidney injury) ([place]) 01/30/2019  . Poor appetite 01/05/2019  . Goals of care, counseling/discussion 12/28/2018  . Encounter for antineoplastic chemotherapy 12/28/2018  . Stage III squamous cell carcinoma of right lung ([place]) 12/28/2018  . Dementia without behavioral disturbance ([place]) 12/16/2018  . Lung mass 12/15/2018  . Pelvic mass in male 12/15/2018  . Postobstructive pneumonia 12/15/2018  . Hypertension    PCP:  [name], NP Pharmacy:   [place], [place] - [address] [place] [address] Phone: [phone] Fax: [phone]  [place]), [place] - [address] [place] [address] Phone: [phone] Fax: [place], [place] [address] Phone: [phone] Fax: [phone]     Social Determinants of Health (SDOH) Interventions    Readmission Risk Interventions No flowsheet data found.

## 2019-05-10 NOTE — Progress Notes (Signed)
Pt dressed with daughters assistance. Pts daughter given discharge instructions and all questions answered. Pt will discharge home with daughter via private vehicle.

## 2019-05-10 NOTE — Evaluation (Signed)
Occupational Therapy Evaluation Patient Details Name: Richard [name] MRN: [id number] DOB: [birth year] Today's Date: 05/10/2019    History of Present Illness 78 yo male admitted with sepsis. Hx of dementia, lung ca, Afib.   Clinical Impression   Pt admitted with the above diagnoses and presents with below problem list. Pt will benefit from continued acute OT to address the below listed deficits and maximize independence with basic ADLs prior to d/c to venue below. Unclear what pt's baseline with ADLs was PTA as he is unable to provide history and no family present on eval. Currently, pt is mod A with UB/LB ADLs, setup for eating, min to mod A with stand-pivot transfers to a BSC. Seeks external support in standing but declines using a rw.      Follow Up Recommendations  Home health OT;Supervision/Assistance - 24 hour    Equipment Recommendations  3 in 1 bedside commode    Recommendations for Other Services       Precautions / Restrictions Precautions Precautions: Fall Restrictions Weight Bearing Restrictions: No      Mobility Bed Mobility Overal bed mobility: Needs Assistance Bed Mobility: Supine to Sit     Supine to sit: Min guard     General bed mobility comments: extre time and effort. min guard for safety.   Transfers Overall transfer level: Needs assistance Equipment used: 1 person hand held assist Transfers: Sit to/from [facility] Sit to Stand: Mod assist;Min assist Stand pivot transfers: Min assist       General transfer comment: posterior LOB on intial stand needing mod A to steady, then +1 HHA to steady during pivot transfer to recliner. Pt stating that he does not use a rw and seems avoidant of rw.     Balance Overall balance assessment: Needs assistance   Sitting balance-Leahy Scale: Fair Sitting balance - Comments: sits in trunk flexed position in unsupported sitting Postural control: Posterior lean Standing balance support: Single  extremity supported;Bilateral upper extremity supported Standing balance-Leahy Scale: Poor Standing balance comment: pt needing +1 HHA in static stand and during pivoting.                            ADL either performed or assessed with clinical judgement   ADL Overall ADL's : Needs assistance/impaired Eating/Feeding: Set up;Sitting   Grooming: Sitting;Moderate assistance   Upper Body Bathing: Moderate assistance;Sitting   Lower Body Bathing: Moderate assistance;Sit to/from stand   Upper Body Dressing : Moderate assistance;Sitting   Lower Body Dressing: Moderate assistance;Sit to/from stand   Toilet Transfer: Minimal assistance;Stand-pivot;BSC;Moderate assistance Toilet Transfer Details (indicate cue type and reason): +1 HHA. simulated with EOB to recliner. initial LOB on standing.  Toileting- Clothing Manipulation and Hygiene: Moderate assistance;Sit to/from stand   Tub/ Banker: Moderate assistance;Minimal assistance;Stand-pivot;3 in 1 Tub/Shower Transfer Details (indicate cue type and reason): +1 HHA this session during SPT   General ADL Comments: Pt completed bed mobility, sat EOB a few minutes then stand-pivot to sit up in recliner. Pt drinking an Ensure at end of session. Balance, generalized weakness, decreased activity tolerance and cognitive deficits impacting assist level with ADLs.     Vision         Perception     Praxis      Pertinent Vitals/Pain Pain Assessment: Faces Faces Pain Scale: Hurts little more Pain Location: mild grimacing with bed mobility and SPT Pain Descriptors / Indicators: Grimacing Pain Intervention(s): Monitored during session  Hand Dominance     Extremity/Trunk Assessment Upper Extremity Assessment Upper Extremity Assessment: Generalized weakness   Lower Extremity Assessment Lower Extremity Assessment: Defer to PT evaluation;Generalized weakness   Cervical / Trunk Assessment Cervical / Trunk Assessment:  Kyphotic   Communication Communication Communication: No difficulties   Cognition Arousal/Alertness: Awake/alert Behavior During Therapy: Flat affect;WFL for tasks assessed/performed Overall Cognitive Status: History of cognitive impairments - at baseline                                     General Comments       Exercises     Shoulder Instructions      Home Living Family/patient expects to be discharged to:: Private residence Living Arrangements: Children Available Help at Discharge: Family Type of Home: House Home Access: Stairs to enter Technical brewer of Steps: 2-3   Home Layout: Two level Alternate Level Stairs-Number of Steps: flight   Bathroom Shower/Tub: Occupational psychologist: Standard     Home Equipment: None   Additional Comments: Information gained from previous admission.      Prior Functioning/Environment Level of Independence: Needs assistance  Gait / Transfers Assistance Needed: pt states he doesn't use a RW. (unsure of reliability) ADL's / Homemaking Assistance Needed: no family present to provide PLOF data            OT Problem List: Decreased strength;Decreased activity tolerance;Impaired balance (sitting and/or standing);Decreased cognition;Decreased safety awareness;Decreased knowledge of use of DME or AE;Decreased knowledge of precautions;Pain      OT Treatment/Interventions: Self-care/ADL training;DME and/or AE instruction;Therapeutic activities;Patient/family education;Balance training;Cognitive remediation/compensation    OT Goals(Current goals can be found in the care plan section) Acute Rehab OT Goals Patient Stated Goal: none stated OT Goal Formulation: Patient unable to participate in goal setting Time For Goal Achievement: 05/24/19 Potential to Achieve Goals: Good ADL Goals Pt Will Perform Grooming: with supervision;with min assist;sitting Pt Will Perform Upper Body Dressing: with min  assist;sitting;with supervision Pt Will Perform Lower Body Dressing: with supervision;with min assist Pt Will Transfer to Toilet: with min guard assist;ambulating Pt Will Perform Toileting - Clothing Manipulation and hygiene: with min guard assist;sit to/from stand  OT Frequency: Min 2X/week   Barriers to D/C:    unclear how much supervision/assist he has at home        Co-evaluation              AM-PAC OT "6 Clicks" Daily Activity     Outcome Measure Help from another person eating meals?: A Little Help from another person taking care of personal grooming?: A Lot Help from another person toileting, which includes using toliet, bedpan, or urinal?: A Lot Help from another person bathing (including washing, rinsing, drying)?: A Lot Help from another person to put on and taking off regular upper body clothing?: A Lot Help from another person to put on and taking off regular lower body clothing?: A Lot 6 Click Score: 13   End of Session Nurse Communication: Mobility status  Activity Tolerance: Patient limited by fatigue Patient left: in chair;with call bell/phone within reach;with chair alarm set  OT Visit Diagnosis: Unsteadiness on feet (R26.81);Muscle weakness (generalized) (M62.81);Other symptoms and signs involving cognitive function;Pain                Time: 0939-1000 OT Time Calculation (min): 21 min Charges:  OT General Charges $OT Visit: 1 Visit OT Evaluation $OT  Eval Moderate Complexity: [place], OT Acute Rehabilitation Services Pager: [phone] Office: [phone]   Hortencia Pilar 05/10/2019, 10:27 AM

## 2019-05-10 NOTE — Discharge Summary (Signed)
Physician Discharge Summary  Richard [name] SNK:[id number] DOB: [birth year] DOA: 05/08/2019  PCP: [name], NP  Admit date: 05/08/2019 Discharge date: 05/10/2019  Admitted From: Home Disposition:  Home  Discharge Condition:Stable CODE STATUS:FULL Diet recommendation: Heart Healthy  Brief/Interim Summary:  Patient is a 78 year old male with history of squamous cell carcinoma of the right lung, dementia, hypertension, hyperlipidemia who presented with progressive cough, shortness of breath for a week.  When he presented to the emergency department he was febrile, tachycardic, hypotensive.  Also had lactic acidosis.  Fever was up to 102F.  Patient was started on broad-spectrum antibiotics.  Blood cultures and urine culture sent. He was admitted and discharged on August/2020 during which he was found to have postobstructive pneumonia, pulmonary embolism with right heart strain.  He also developed A. fib with RVR at the time. Hospital course remained fine. He remained hemodynamically stable. Currently he is afebrile. Chest imaging did not show any pneumonia. Urine culture growing gram-positive cocci. He was started on broad-spectrum antibiotics. He was also given a dose of fosfomycin. Final urine culture is pending. Blood cultures negative so far.  He is hemodynamically stable for discharge home today.  Following problems were addressed during his hospitalization:  Suspected sepsis: Ruled out.Presented with fever, tachycardia, hypotension.  Chest x-ray right middle lobe mass with near complete collapse of the right middle lobe.Not a new finding.No definite focal infiltrate.   Currently saturating fine on room air. Blood cultures no growth till date. Urine cultures showed gram-positive cocci. He was given a dose of fosfomycin. We will continue oral antibiotics for 5 more days at home.  Stage IIIA(T3, N2, M0) non-small cell lung cancer, squamous cell carcinoma : Status post concurrent  chemoradiation.  Status post 8 cycles.  Follows with Dr. Julien Nordmann.  Plan for restaging scans. Imagings here showed right middle lobe mass with near complete collapse of the right middle lobe. No definite focal infiltrate   Hypertension: Patient is currently hypotensive.  He was also on midodrine at home for hypotension which is not taking.  Continue gentle IV fluids.  History of Paroxysmal A. Fib/history of PE: Currently in normal sinus rhythm.  Currently rate is controlled.  Continue Xarelto for anticoagulation.  History of dementia: Continue supportive care  Microcytic anemia: Continue iron supplementation.  Currently H&H is stable.  Moderate to severe protein calorie moderation: Consulted dietitian evaluation.  Debility/deconditioning: Pt recommended HHPT   Discharge Diagnoses:  Principal Problem:   Sepsis ([place]) Active Problems:   Lung mass   Hypertension   Dementia without behavioral disturbance (HCC)   Atrial fibrillation with RVR [facility])    Discharge Instructions  Discharge Instructions    Diet - low sodium heart healthy   Complete by: As directed    Discharge instructions   Complete by: As directed    1)Please take prescribed medication as instructed. 2)Follow up with your oncologist as an outpatient.   Increase activity slowly   Complete by: As directed      Allergies as of 05/10/2019   No Known Allergies     Medication List    STOP taking these medications   midodrine 5 MG tablet Commonly known as: PROAMATINE     TAKE these medications   amoxicillin-clavulanate 875-125 MG tablet Commonly known as: Augmentin Take 1 tablet by mouth 2 (two) times daily for 5 days. Start taking on: May 11, 2019   ferrous sulfate 325 (65 FE) MG tablet Take 325 mg by mouth 2 (two) times daily with a  meal.   Fish Oil 1200 MG Caps Take 1,200 mg by mouth daily.   multivitamin tablet Take 1 tablet by mouth daily.   prochlorperazine 10 MG tablet Commonly known  as: COMPAZINE Take 1 tablet (10 mg total) by mouth every 6 (six) hours as needed for nausea or vomiting.   rivaroxaban 20 MG Tabs tablet Commonly known as: XARELTO Take 1 tablet (20 mg total) by mouth daily with supper.      Follow-up Information    [name], NP. Schedule an appointment as soon as possible for a visit in 1 week(s).   Specialty: Internal Medicine Contact information: [place] [address] [phone]          No Known Allergies  Consultations:  None   Procedures/Studies: Dg Chest Port 1 View  Result Date: 05/08/2019 CLINICAL DATA:  Cough and fever EXAM: PORTABLE CHEST 1 VIEW COMPARISON:  03/14/2019 FINDINGS: The lung bases were not fully visualized on this exam. Again noted is a large mass within the right middle lobe with complete collapse of the right middle lobe. This appearance is similar to prior chest x-ray dated 03/14/2019 with some proved aeration in the right middle lobe. The heart size is stable from prior study. There is elevation of the right hemidiaphragm. There is mild shift of the mediastinum to the right. IMPRESSION: 1. Limited study. The lung bases were not fully visualized on this exam. 2. Again noted is a right middle lobe mass with near complete collapse of the right middle lobe. 3. No definite focal infiltrate identified on this exam given the limitations described above. Electronically Signed   By: Constance Holster M.D.   On: 05/08/2019 15:19       Subjective: Patient seen and examined the bedside this morning. Hemodynamically stable. Medically stable for discharge home  Discharge Exam: Vitals:   05/09/19 2058 05/10/19 0530  BP: 112/66 (!) 100/59  Pulse: (!) 104 80  Resp: 20 18  Temp: 98.4 F (36.9 C) 98.1 F (36.7 C)  SpO2: 100% 100%   Vitals:   05/09/19 0453 05/09/19 1331 05/09/19 2058 05/10/19 0530  BP: (!) 98/50 114/65 112/66 (!) 100/59  Pulse: 97 92 (!) 104 80  Resp: 18 18 20 18   Temp:  98 F (36.7 C) 98.3 F (36.8 C) 98.4 F (36.9 C) 98.1 F (36.7 C)  TempSrc: Oral  Oral Oral  SpO2: 96% 96% 100% 100%  Weight:      Height:        General: Pt is alert, awake, not in acute distress Cardiovascular: RRR, S1/S2 +, no rubs, no gallops Respiratory: CTA bilaterally, no wheezing, no rhonchi Abdominal: Soft, NT, ND, bowel sounds + Extremities: no edema, no cyanosis    The results of significant diagnostics from this hospitalization (including imaging, microbiology, ancillary and laboratory) are listed below for reference.     Microbiology: Recent Results (from the past 240 hour(s))  Blood Culture (routine x 2)     Status: None (Preliminary result)   Collection Time: 05/08/19  3:33 PM   Specimen: BLOOD  Result Value Ref Range Status   Specimen Description   Final    BLOOD BLOOD RIGHT FOREARM Performed at [facility] [address], [place], [place] [address]    Special Requests   Final    BOTTLES DRAWN AEROBIC AND ANAEROBIC Blood Culture results may not be optimal due to an excessive volume of blood received in culture bottles Performed at [facility], [place] [address] [place], [place] [address]    Culture   Final    NO GROWTH < 12 HOURS Performed at [facility] [address] [place], [place] [address]    Report Status PENDING  Incomplete  Blood Culture (routine x 2)     Status: None (Preliminary result)   Collection Time: 05/08/19  5:01 PM   Specimen: BLOOD  Result Value Ref Range Status   Specimen Description   Final    BLOOD LEFT WRIST Performed at [facility] [address] [place], [place] [address]    Special Requests   Final    BOTTLES DRAWN AEROBIC AND ANAEROBIC Blood Culture adequate volume Performed at [facility] [address] [place], [place] [address]    Culture   Final    NO GROWTH < 12 HOURS Performed at [facility] [address]  [place], [place] [address]    Report Status PENDING  Incomplete  SARS CORONAVIRUS 2 (TAT 6-24 HRS) Nasopharyngeal Nasopharyngeal Swab     Status: None   Collection Time: 05/08/19  5:34 PM   Specimen: Nasopharyngeal Swab  Result Value Ref Range Status   SARS Coronavirus 2 NEGATIVE NEGATIVE Final    Comment: (NOTE) SARS-CoV-2 target nucleic acids are NOT DETECTED. The SARS-CoV-2 RNA is generally detectable in upper and lower respiratory specimens during the acute phase of infection. Negative results do not preclude SARS-CoV-2 infection, do not rule out co-infections with other pathogens, and should not be used as the sole basis for treatment or other patient management decisions. Negative results must be combined with clinical observations, patient history, and epidemiological information. The expected result is Negative. Fact Sheet for Patients: SugarRoll.be Fact Sheet for Healthcare Providers: https://www.woods-mathews.com/ This test is not yet approved or cleared by the [place] FDA and  has been authorized for detection and/or diagnosis of SARS-CoV-2 by FDA under an Emergency Use Authorization (EUA). This EUA will remain  in effect (meaning this test can be used) for the duration of the COVID-19 declaration under Section 56 4(b)(1) of the Act, 21 U.S.C. section 360bbb-3(b)(1), unless the authorization is terminated or revoked sooner. Performed at [facility], [place] [address] [place], [place] [address]   Urine culture     Status: Abnormal (Preliminary result)   Collection Time: 05/08/19  6:44 PM   Specimen: In/Out Cath Urine  Result Value Ref Range Status   Specimen Description   Final    IN/OUT CATH URINE Performed at [facility] [address] [place], [place] [address]    Special Requests   Final    NONE Performed at [facility], [place] [address] [place], [place] [address]     Culture (A)  Final    >=100,000 COLONIES/mL GRAM POSITIVE COCCI IDENTIFICATION AND SUSCEPTIBILITIES TO FOLLOW Performed at [facility], [place] [address], [place], [place] [address]    Report Status PENDING  Incomplete     Labs: BNP (last 3 results) No results for input(s): BNP in the last 8760 hours. Basic Metabolic Panel: Recent Labs  Lab 05/08/19 1740 05/09/19 0431  NA 138  --   K 4.8  --   CL 107  --   CO2 22  --   GLUCOSE 111*  --   BUN 21  --   CREATININE 0.87 0.58*  CALCIUM 8.8*  --    Liver Function Tests: Recent Labs  Lab 05/08/19  1740  AST 28  ALT 22  ALKPHOS 81  BILITOT 0.5  PROT 6.4*  ALBUMIN 3.0*   No results for input(s): LIPASE, AMYLASE in the last 168 hours. No results for input(s): AMMONIA in the last 168 hours. CBC: Recent Labs  Lab 05/08/19 1538  WBC 8.9  NEUTROABS 8.1*  HGB 11.1*  HCT 35.4*  MCV 65.6*  PLT 382   Cardiac Enzymes: No results for input(s): CKTOTAL, CKMB, CKMBINDEX, TROPONINI in the last 168 hours. BNP: Invalid input(s): POCBNP CBG: No results for input(s): GLUCAP in the last 168 hours. D-Dimer No results for input(s): DDIMER in the last 72 hours. Hgb A1c No results for input(s): HGBA1C in the last 72 hours. Lipid Profile No results for input(s): CHOL, HDL, LDLCALC, TRIG, CHOLHDL, LDLDIRECT in the last 72 hours. Thyroid function studies No results for input(s): TSH, T4TOTAL, T3FREE, THYROIDAB in the last 72 hours.  Invalid input(s): FREET3 Anemia work up No results for input(s): VITAMINB12, FOLATE, FERRITIN, TIBC, IRON, RETICCTPCT in the last 72 hours. Urinalysis    Component Value Date/Time   COLORURINE YELLOW 05/08/2019 1844   APPEARANCEUR HAZY (A) 05/08/2019 1844   LABSPEC 1.016 05/08/2019 1844   PHURINE 8.0 05/08/2019 1844   GLUCOSEU NEGATIVE 05/08/2019 1844   HGBUR NEGATIVE 05/08/2019 1844   BILIRUBINUR NEGATIVE 05/08/2019 1844   KETONESUR NEGATIVE 05/08/2019 1844   PROTEINUR NEGATIVE 05/08/2019  1844   NITRITE NEGATIVE 05/08/2019 1844   LEUKOCYTESUR NEGATIVE 05/08/2019 1844   Sepsis Labs Invalid input(s): PROCALCITONIN,  WBC,  LACTICIDVEN Microbiology Recent Results (from the past 240 hour(s))  Blood Culture (routine x 2)     Status: None (Preliminary result)   Collection Time: 05/08/19  3:33 PM   Specimen: BLOOD  Result Value Ref Range Status   Specimen Description   Final    BLOOD BLOOD RIGHT FOREARM Performed at [facility], [place] [address] [place], [place] [address]    Special Requests   Final    BOTTLES DRAWN AEROBIC AND ANAEROBIC Blood Culture results may not be optimal due to an excessive volume of blood received in culture bottles Performed at [facility] [address], [place], [place] [address]    Culture   Final    NO GROWTH < 12 HOURS Performed at [facility] [address] [place], [place] [address]    Report Status PENDING  Incomplete  Blood Culture (routine x 2)     Status: None (Preliminary result)   Collection Time: 05/08/19  5:01 PM   Specimen: BLOOD  Result Value Ref Range Status   Specimen Description   Final    BLOOD LEFT WRIST Performed at [facility] [address], [place], [place] [address]    Special Requests   Final    BOTTLES DRAWN AEROBIC AND ANAEROBIC Blood Culture adequate volume Performed at [facility] [address], [place], [place] [address]    Culture   Final    NO GROWTH < 12 HOURS Performed at [facility] [address] [place], [place] [address]    Report Status PENDING  Incomplete  SARS CORONAVIRUS 2 (TAT 6-24 HRS) Nasopharyngeal Nasopharyngeal Swab     Status: None   Collection Time: 05/08/19  5:34 PM   Specimen: Nasopharyngeal Swab  Result Value Ref Range Status   SARS Coronavirus 2 NEGATIVE NEGATIVE Final    Comment: (NOTE) SARS-CoV-2 target nucleic acids are NOT DETECTED. The SARS-CoV-2 RNA is generally  detectable  in upper and lower respiratory specimens during the acute phase of infection. Negative results do not preclude SARS-CoV-2 infection, do not rule out co-infections with other pathogens, and should not be used as the sole basis for treatment or other patient management decisions. Negative results must be combined with clinical observations, patient history, and epidemiological information. The expected result is Negative. Fact Sheet for Patients: SugarRoll.be Fact Sheet for Healthcare Providers: https://www.woods-mathews.com/ This test is not yet approved or cleared by the [place] FDA and  has been authorized for detection and/or diagnosis of SARS-CoV-2 by FDA under an Emergency Use Authorization (EUA). This EUA will remain  in effect (meaning this test can be used) for the duration of the COVID-19 declaration under Section 56 4(b)(1) of the Act, 21 U.S.C. section 360bbb-3(b)(1), unless the authorization is terminated or revoked sooner. Performed at [facility], [place] [address] [place], [place] [address]   Urine culture     Status: Abnormal (Preliminary result)   Collection Time: 05/08/19  6:44 PM   Specimen: In/Out Cath Urine  Result Value Ref Range Status   Specimen Description   Final    IN/OUT CATH URINE Performed at [facility] [address] [place], [place] [address]    Special Requests   Final    NONE Performed at [facility], [place] [address], [place], [place] [address]    Culture (A)  Final    >=100,000 COLONIES/mL GRAM POSITIVE COCCI IDENTIFICATION AND SUSCEPTIBILITIES TO FOLLOW Performed at [facility], [place] [address], [place],  [address]    Report Status PENDING  Incomplete    Please note: You were cared for by a hospitalist during your hospital stay. Once you are discharged, your primary care physician will handle any further medical issues. Please note  that NO REFILLS for any discharge medications will be authorized once you are discharged, as it is imperative that you return to your primary care physician (or establish a relationship with a primary care physician if you do not have one) for your post hospital discharge needs so that they can reassess your need for medications and monitor your lab values.    Time coordinating discharge: 40 minutes  SIGNED:   Shelly Coss, MD  [facility] 05/10/2019, 11:48 AM Pager [phone]  If 7PM-7AM, please contact night-coverage [email or web address] Password [email or web address]

## 2019-05-11 NOTE — Progress Notes (Incomplete)
°  Patient Name: Richard [name] MRN: [id number] DOB: June 20, 1940 Referring Physician: Curt Bears (Profile Not Attached) Date of Service: 05/10/2019 [facility], [place]                                                        End Of Treatment Note  Diagnoses: C34.2-Malignant neoplasm of middle lobe, bronchus or lung  Cancer Staging: stage IIIA(T3, N2, M0) non-small cell lung cancer, squamous cell carcinoma presented with large right middle lobe lung mass with postobstructive pneumonia as well as right hilar and mediastinal lymphadenopathy diagnosed in July 2020.  Intent: Curative  Radiation Treatment Dates: 03/21/2019 through 05/10/2019 Site Technique Total Dose (Gy) Dose per Fx (Gy) Completed Fx Beam Energies  Thorax: Lung_Rt 3D 60/60 2 30/30 6X, 10X   Narrative: The patient tolerated radiation therapy relatively well. Patient and daughter reported shortness of breath, poor PO intake, and cough throughout treatment. Patient was placed on antibioitics. On 05/02/2019, the patient's right chest area did show some hyperpigementation changes without skin breakdown.  He presented to the ED on 05/08/2019 with complaints of fever and shortness of breath. Chest x-ray was limited but did show the already known right middle lobe mass with near complete collapse of the right middle lobe. He was admitted to the hospital with diagnosis of severe sepsis. He was started on broad spectrum antibiotics. Discharged on 05/10/2019.  Plan: The patient will follow-up with radiation oncology in one month.  ________________________________________________   Richard Promise, PhD, MD  This document serves as a record of services personally performed by Gery Pray, MD. It was created on his behalf by Clerance Lav, a trained medical scribe. The  creation of this record is based on the scribe's personal observations and the provider's statements to them. This document has been checked and approved by the attending provider.

## 2019-05-12 LAB — URINE CULTURE: Culture: >=100000 — AB

## 2019-05-13 LAB — CULTURE, BLOOD (ROUTINE X 2)
Culture: NO GROWTH
Culture: NO GROWTH
Special Requests: ADEQUATE

## 2019-05-18 ENCOUNTER — Emergency Department (HOSPITAL_COMMUNITY): Payer: Medicare HMO

## 2019-05-18 ENCOUNTER — Other Ambulatory Visit: Payer: Self-pay

## 2019-05-18 ENCOUNTER — Inpatient Hospital Stay (HOSPITAL_COMMUNITY)
Admission: EM | Admit: 2019-05-18 | Discharge: 2019-05-30 | DRG: 193 | Disposition: A | Discharge status: Home Health | Payer: Medicare HMO | Attending: Internal Medicine | Admitting: Internal Medicine

## 2019-05-18 ENCOUNTER — Encounter (HOSPITAL_COMMUNITY): Payer: Self-pay

## 2019-05-18 ENCOUNTER — Telehealth: Payer: Self-pay | Admitting: *Deleted

## 2019-05-18 DIAGNOSIS — Z7901 Long term (current) use of anticoagulants: Secondary | ICD-10-CM

## 2019-05-18 DIAGNOSIS — J189 Pneumonia, unspecified organism: Principal | ICD-10-CM | POA: Diagnosis present

## 2019-05-18 DIAGNOSIS — Z20828 Contact with and (suspected) exposure to other viral communicable diseases: Secondary | ICD-10-CM | POA: Diagnosis not present

## 2019-05-18 DIAGNOSIS — Z515 Encounter for palliative care: Secondary | ICD-10-CM | POA: Diagnosis present

## 2019-05-18 DIAGNOSIS — I10 Essential (primary) hypertension: Secondary | ICD-10-CM | POA: Diagnosis not present

## 2019-05-18 DIAGNOSIS — J90 Pleural effusion, not elsewhere classified: Secondary | ICD-10-CM | POA: Diagnosis present

## 2019-05-18 DIAGNOSIS — Z681 Body mass index (BMI) 19 or less, adult: Secondary | ICD-10-CM | POA: Diagnosis not present

## 2019-05-18 DIAGNOSIS — J181 Lobar pneumonia, unspecified organism: Secondary | ICD-10-CM | POA: Diagnosis not present

## 2019-05-18 DIAGNOSIS — G309 Alzheimer's disease, unspecified: Secondary | ICD-10-CM | POA: Diagnosis not present

## 2019-05-18 DIAGNOSIS — I480 Paroxysmal atrial fibrillation: Secondary | ICD-10-CM | POA: Diagnosis present

## 2019-05-18 DIAGNOSIS — E46 Unspecified protein-calorie malnutrition: Secondary | ICD-10-CM | POA: Diagnosis not present

## 2019-05-18 DIAGNOSIS — Y95 Nosocomial condition: Secondary | ICD-10-CM | POA: Diagnosis present

## 2019-05-18 DIAGNOSIS — E7800 Pure hypercholesterolemia, unspecified: Secondary | ICD-10-CM | POA: Diagnosis present

## 2019-05-18 DIAGNOSIS — Z923 Personal history of irradiation: Secondary | ICD-10-CM | POA: Diagnosis not present

## 2019-05-18 DIAGNOSIS — Z86711 Personal history of pulmonary embolism: Secondary | ICD-10-CM

## 2019-05-18 DIAGNOSIS — Z7189 Other specified counseling: Secondary | ICD-10-CM | POA: Diagnosis not present

## 2019-05-18 DIAGNOSIS — D6481 Anemia due to antineoplastic chemotherapy: Secondary | ICD-10-CM | POA: Diagnosis present

## 2019-05-18 DIAGNOSIS — F17210 Nicotine dependence, cigarettes, uncomplicated: Secondary | ICD-10-CM | POA: Diagnosis present

## 2019-05-18 DIAGNOSIS — R627 Adult failure to thrive: Secondary | ICD-10-CM | POA: Diagnosis not present

## 2019-05-18 DIAGNOSIS — R5381 Other malaise: Secondary | ICD-10-CM | POA: Diagnosis not present

## 2019-05-18 DIAGNOSIS — Z79899 Other long term (current) drug therapy: Secondary | ICD-10-CM

## 2019-05-18 DIAGNOSIS — J9601 Acute respiratory failure with hypoxia: Secondary | ICD-10-CM | POA: Diagnosis present

## 2019-05-18 DIAGNOSIS — E785 Hyperlipidemia, unspecified: Secondary | ICD-10-CM | POA: Diagnosis not present

## 2019-05-18 DIAGNOSIS — F0390 Unspecified dementia without behavioral disturbance: Secondary | ICD-10-CM | POA: Diagnosis present

## 2019-05-18 DIAGNOSIS — C3490 Malignant neoplasm of unspecified part of unspecified bronchus or lung: Secondary | ICD-10-CM | POA: Diagnosis not present

## 2019-05-18 DIAGNOSIS — F0391 Unspecified dementia with behavioral disturbance: Secondary | ICD-10-CM | POA: Diagnosis present

## 2019-05-18 DIAGNOSIS — C3491 Malignant neoplasm of unspecified part of right bronchus or lung: Secondary | ICD-10-CM | POA: Diagnosis present

## 2019-05-18 DIAGNOSIS — F0280 Dementia in other diseases classified elsewhere without behavioral disturbance: Secondary | ICD-10-CM | POA: Diagnosis not present

## 2019-05-18 DIAGNOSIS — R630 Anorexia: Secondary | ICD-10-CM

## 2019-05-18 DIAGNOSIS — I959 Hypotension, unspecified: Secondary | ICD-10-CM | POA: Diagnosis present

## 2019-05-18 DIAGNOSIS — R590 Localized enlarged lymph nodes: Secondary | ICD-10-CM | POA: Diagnosis not present

## 2019-05-18 DIAGNOSIS — Z801 Family history of malignant neoplasm of trachea, bronchus and lung: Secondary | ICD-10-CM

## 2019-05-18 DIAGNOSIS — R000 Tachycardia, unspecified: Secondary | ICD-10-CM | POA: Diagnosis present

## 2019-05-18 DIAGNOSIS — L8990 Pressure ulcer of unspecified site, unspecified stage: Secondary | ICD-10-CM | POA: Insufficient documentation

## 2019-05-18 DIAGNOSIS — Z66 Do not resuscitate: Secondary | ICD-10-CM | POA: Diagnosis present

## 2019-05-18 DIAGNOSIS — R69 Illness, unspecified: Secondary | ICD-10-CM | POA: Diagnosis not present

## 2019-05-18 DIAGNOSIS — J440 Chronic obstructive pulmonary disease with acute lower respiratory infection: Secondary | ICD-10-CM | POA: Diagnosis present

## 2019-05-18 DIAGNOSIS — E872 Acidosis: Secondary | ICD-10-CM | POA: Diagnosis present

## 2019-05-18 DIAGNOSIS — E43 Unspecified severe protein-calorie malnutrition: Secondary | ICD-10-CM

## 2019-05-18 DIAGNOSIS — C342 Malignant neoplasm of middle lobe, bronchus or lung: Secondary | ICD-10-CM | POA: Diagnosis not present

## 2019-05-18 DIAGNOSIS — T451X5A Adverse effect of antineoplastic and immunosuppressive drugs, initial encounter: Secondary | ICD-10-CM | POA: Diagnosis present

## 2019-05-18 DIAGNOSIS — J449 Chronic obstructive pulmonary disease, unspecified: Secondary | ICD-10-CM | POA: Diagnosis not present

## 2019-05-18 DIAGNOSIS — R1310 Dysphagia, unspecified: Secondary | ICD-10-CM | POA: Diagnosis not present

## 2019-05-18 DIAGNOSIS — Z86718 Personal history of other venous thrombosis and embolism: Secondary | ICD-10-CM

## 2019-05-18 LAB — TROPONIN I (HIGH SENSITIVITY)
Troponin I (High Sensitivity): 5 ng/L (ref <18)
Troponin I (High Sensitivity): 5 ng/L (ref <18)

## 2019-05-18 LAB — COMPREHENSIVE METABOLIC PANEL
ALT: 14 U/L (ref 0–44)
AST: 19 U/L (ref 15–41)
Albumin: 3 g/dL — ABNORMAL LOW (ref 3.5–5.0)
Alkaline Phosphatase: 82 U/L (ref 38–126)
Anion gap: 11 (ref 5–15)
BUN: 14 mg/dL (ref 8–23)
CO2: 28 mmol/L (ref 22–32)
Calcium: 9.5 mg/dL (ref 8.9–10.3)
Chloride: 102 mmol/L (ref 98–111)
Creatinine, Ser: 0.71 mg/dL (ref 0.61–1.24)
GFR calc Af Amer: >60 mL/min (ref >60)
GFR calc non Af Amer: >60 mL/min (ref >60)
Glucose, Bld: 132 mg/dL — ABNORMAL HIGH (ref 70–99)
Potassium: 4.3 mmol/L (ref 3.5–5.1)
Sodium: 141 mmol/L (ref 135–145)
Total Bilirubin: 0.6 mg/dL (ref 0.3–1.2)
Total Protein: 7.5 g/dL (ref 6.5–8.1)

## 2019-05-18 LAB — CBC WITH DIFFERENTIAL/PLATELET
Abs Immature Granulocytes: 0.03 10*3/uL (ref 0.00–0.07)
Basophils Absolute: 0 10*3/uL (ref 0.0–0.1)
Basophils Relative: 1 %
Eosinophils Absolute: 0 10*3/uL (ref 0.0–0.5)
Eosinophils Relative: 1 %
HCT: 35.8 % — ABNORMAL LOW (ref 39.0–52.0)
Hemoglobin: 11.3 g/dL — ABNORMAL LOW (ref 13.0–17.0)
Immature Granulocytes: 1 %
Lymphocytes Relative: 14 %
Lymphs Abs: 0.9 10*3/uL (ref 0.7–4.0)
MCH: 20.4 pg — ABNORMAL LOW (ref 26.0–34.0)
MCHC: 31.6 g/dL (ref 30.0–36.0)
MCV: 64.5 fL — ABNORMAL LOW (ref 80.0–100.0)
Monocytes Absolute: 0.9 10*3/uL (ref 0.1–1.0)
Monocytes Relative: 14 %
Neutro Abs: 4.5 10*3/uL (ref 1.7–7.7)
Neutrophils Relative %: 69 %
Platelets: 394 10*3/uL (ref 150–400)
RBC: 5.55 MIL/uL (ref 4.22–5.81)
RDW: 25.3 % — ABNORMAL HIGH (ref 11.5–15.5)
WBC: 6.4 10*3/uL (ref 4.0–10.5)
nRBC: 0.5 % — ABNORMAL HIGH (ref 0.0–0.2)

## 2019-05-18 LAB — LACTIC ACID, PLASMA
Lactic Acid, Venous: 2.5 mmol/L (ref 0.5–1.9)
Lactic Acid, Venous: 1.8 mmol/L (ref 0.5–1.9)

## 2019-05-18 LAB — LIPASE, BLOOD: Lipase: 21 U/L (ref 11–51)

## 2019-05-18 MED ORDER — SODIUM CHLORIDE 0.9 % IV SOLN
2.0000 g | Freq: Three times a day (TID) | INTRAVENOUS | Status: DC
  Administered 2019-05-19 – 2019-05-22 (×11): 2 g via INTRAVENOUS
  Filled 2019-05-18 (×14): qty 2

## 2019-05-18 MED ORDER — RIVAROXABAN 20 MG PO TABS
20.0000 mg | ORAL_TABLET | Freq: Every day | ORAL | Status: DC
  Administered 2019-05-19 – 2019-05-21 (×4): 20 mg via ORAL
  Filled 2019-05-18 (×6): qty 1

## 2019-05-18 MED ORDER — LIDOCAINE-EPINEPHRINE (PF) 2 %-1:200000 IJ SOLN
10.0000 mL | Freq: Once | INTRAMUSCULAR | Status: AC
  Administered 2019-05-18: 10 mL via INTRADERMAL
  Filled 2019-05-18: qty 10

## 2019-05-18 MED ORDER — PIPERACILLIN-TAZOBACTAM 3.375 G IVPB 30 MIN
3.3750 g | Freq: Once | INTRAVENOUS | Status: AC
  Administered 2019-05-18: 3.375 g via INTRAVENOUS
  Filled 2019-05-18: qty 50

## 2019-05-18 MED ORDER — SODIUM CHLORIDE 0.9 % IV BOLUS
1000.0000 mL | Freq: Once | INTRAVENOUS | Status: AC
  Administered 2019-05-18: 1000 mL via INTRAVENOUS

## 2019-05-18 MED ORDER — VANCOMYCIN HCL 10 G IV SOLR
1250.0000 mg | Freq: Once | INTRAVENOUS | Status: AC
  Administered 2019-05-18: 1250 mg via INTRAVENOUS
  Filled 2019-05-18: qty 1250

## 2019-05-18 MED ORDER — VANCOMYCIN HCL IN DEXTROSE 750-5 MG/150ML-% IV SOLN
750.0000 mg | Freq: Two times a day (BID) | INTRAVENOUS | Status: DC
  Administered 2019-05-19: 750 mg via INTRAVENOUS
  Filled 2019-05-18 (×2): qty 150

## 2019-05-18 MED ORDER — PRO-STAT SUGAR FREE PO LIQD
30.0000 mL | Freq: Two times a day (BID) | ORAL | Status: DC
  Administered 2019-05-19 – 2019-05-25 (×9): 30 mL via ORAL
  Filled 2019-05-18 (×11): qty 30

## 2019-05-18 NOTE — ED Triage Notes (Signed)
Pt BIB EMS from home. Pt was discharged from here last Tuesday after being treated for sepsis of unknown origin. Pt has not been eating, drinking, or having bowel movements since he left. Hx of dementia.   118/70 HR 110 97% RA RR 16

## 2019-05-18 NOTE — ED Notes (Signed)
Pt asleep, lying on right side. Respirations even and unlabored.

## 2019-05-18 NOTE — ED Notes (Signed)
Date and time results received: 05/18/19 5:07 PM  (use smartphrase ".now" to insert current time)  Test: Lactic Critical Value: 2.5  Name of Provider Notified: Tyrone Nine  Orders Received? Or Actions Taken?: Orders Received - See Orders for details

## 2019-05-18 NOTE — ED Provider Notes (Addendum)
[facility] DEPT Provider Note   CSN: [id number] Arrival date & time: 05/18/19  1442     History   Chief Complaint Chief Complaint  Patient presents with   Failure To Thrive    HPI Richard [name] is a 78 y.o. male.     79 yo M with a chief complaint of decreased oral intake.  Patient tells me he just does not feel like eating.  Will not describe why.  Level 5 caveat dementia.  Records reviewed and the patient had a telephone call to his PCP today.  Noted to be hypoxic on ambulation and having dark urine.  After it was discussed with the family and the oncologist they recommended he come to the ED for evaluation.  The history is provided by the patient.  Illness Severity:  Moderate Onset quality:  Gradual Duration:  2 days Timing:  Constant Progression:  Worsening Chronicity:  New Associated symptoms: no abdominal pain, no chest pain, no congestion, no diarrhea, no fever, no headaches, no myalgias, no rash, no shortness of breath and no vomiting     Past Medical History:  Diagnosis Date   Dementia ([place])    High cholesterol    Hypertension    Lung mass 12/2018    Patient Active Problem List   Diagnosis Date Noted   Pressure injury of skin 05/21/2019   Anorexia 05/20/2019   HCAP (healthcare-associated pneumonia) 05/18/2019   Protein-calorie malnutrition, severe ([place]) 05/18/2019   Sepsis ([place]) 05/08/2019   History of pulmonary embolism 03/30/2019   Pure hypercholesterolemia 03/30/2019   SOB (shortness of breath) 03/30/2019   Atrial fibrillation with RVR ([place])    Palliative care by specialist    DNR (do not resuscitate) discussion    Severe sepsis ([place]) 01/30/2019   Acute pulmonary embolism ([place]) 01/30/2019   Acute respiratory failure with hypoxia ([place]) 01/30/2019   AKI (acute kidney injury) ([place]) 01/30/2019   Poor appetite 01/05/2019   Goals of care, counseling/discussion 12/28/2018   Encounter for  antineoplastic chemotherapy 12/28/2018   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   Dementia without behavioral disturbance ([place]) 12/16/2018   Lung mass 12/15/2018   Pelvic mass in male 12/15/2018   Postobstructive pneumonia 12/15/2018   Hypertension     Past Surgical History:  Procedure Laterality Date   IR GASTROSTOMY TUBE MOD SED  05/25/2019   JOINT REPLACEMENT     VIDEO BRONCHOSCOPY WITH ENDOBRONCHIAL ULTRASOUND N/A 12/16/2018   Procedure: VIDEO BRONCHOSCOPY WITH ENDOBRONCHIAL ULTRASOUND AND FLUROSCOPY;  Surgeon: [name], MD;  Location: [facility] OR;  Service: Pulmonary;  Laterality: N/A;        Home Medications    Prior to Admission medications   Medication Sig Start Date End Date Taking? Authorizing Provider  ferrous sulfate 325 (65 FE) MG tablet Take 325 mg by mouth 2 (two) times daily with a meal.   Yes [provider]  Multiple Vitamin (MULTIVITAMIN) tablet Take 1 tablet by mouth daily.   Yes [provider]  Omega-3 Fatty Acids (FISH OIL) 1200 MG CAPS Take 1,200 mg by mouth daily.   Yes [provider]  prochlorperazine (COMPAZINE) 10 MG tablet Take 1 tablet (10 mg total) by mouth every 6 (six) hours as needed for nausea or vomiting. 01/12/19  Yes Heilingoetter, Cassandra L, PA-C  rivaroxaban (XARELTO) 20 MG TABS tablet Take 1 tablet (20 mg total) by mouth daily with supper. 04/20/19  Yes Curt Bears, MD    Family History  Family History  Problem Relation Age of Onset   Stomach cancer Mother    COPD Father    Lung cancer Brother    Lung cancer Brother     Social History Social History   Tobacco Use   Smoking status: Current Some Day Smoker    Packs/day: 1.00    Years: 60.00    Pack years: 60.00    Types: Cigarettes   Smokeless tobacco: Never Used  Substance Use Topics   Alcohol use: Yes    Comment: occasional   Drug use: No     Allergies   Patient has no known allergies.   Review  of Systems Review of Systems  Unable to perform ROS: Dementia  Constitutional: Negative for chills and fever.  HENT: Negative for congestion and facial swelling.   Eyes: Negative for discharge and visual disturbance.  Respiratory: Negative for shortness of breath.   Cardiovascular: Negative for chest pain and palpitations.  Gastrointestinal: Negative for abdominal pain, diarrhea and vomiting.  Musculoskeletal: Negative for arthralgias and myalgias.  Skin: Negative for color change and rash.  Neurological: Negative for tremors, syncope and headaches.  Psychiatric/Behavioral: Negative for confusion and dysphoric mood.     Physical Exam Updated Vital Signs BP 131/80 (BP Location: Right Arm)    Pulse 91    Temp 97.7 F (36.5 C) (Oral)    Resp 15    Ht 6\' 2"  (1.88 m)    Wt 60.9 kg    SpO2 100%    BMI 17.24 kg/m   Physical Exam Vitals signs and nursing note reviewed.  Constitutional:      Appearance: He is well-developed.  HENT:     Head: Normocephalic and atraumatic.  Eyes:     Pupils: Pupils are equal, round, and reactive to light.  Neck:     Musculoskeletal: Normal range of motion and neck supple.     Vascular: No JVD.  Cardiovascular:     Rate and Rhythm: Normal rate and regular rhythm.     Heart sounds: No murmur. No friction rub. No gallop.   Pulmonary:     Effort: No respiratory distress.     Breath sounds: No wheezing.  Abdominal:     General: There is no distension.     Tenderness: There is no abdominal tenderness. There is no guarding or rebound.  Musculoskeletal: Normal range of motion.  Skin:    Coloration: Skin is not pale.     Findings: No rash.  Neurological:     Mental Status: He is alert and oriented to person, place, and time.  Psychiatric:        Behavior: Behavior normal.      ED Treatments / Results  Labs (all labs ordered are listed, but only abnormal results are displayed) Labs Reviewed  CBC WITH DIFFERENTIAL/PLATELET - Abnormal; Notable for  the following components:      Result Value   Hemoglobin 11.3 (*)    HCT 35.8 (*)    MCV 64.5 (*)    MCH 20.4 (*)    RDW 25.3 (*)    nRBC 0.5 (*)    All other components within normal limits  COMPREHENSIVE METABOLIC PANEL - Abnormal; Notable for the following components:   Glucose, Bld 132 (*)    Albumin 3.0 (*)    All other components within normal limits  LACTIC ACID, PLASMA - Abnormal; Notable for the following components:   Lactic Acid, Venous 2.5 (*)    All other components within normal  limits  CREATININE, SERUM - Abnormal; Notable for the following components:   Creatinine, Ser 0.58 (*)    All other components within normal limits  CBC WITH DIFFERENTIAL/PLATELET - Abnormal; Notable for the following components:   Hemoglobin 10.8 (*)    HCT 34.5 (*)    MCV 64.2 (*)    MCH 20.1 (*)    RDW 24.8 (*)    Platelets 450 (*)    nRBC 0.6 (*)    All other components within normal limits  COMPREHENSIVE METABOLIC PANEL - Abnormal; Notable for the following components:   Glucose, Bld 102 (*)    Albumin 2.4 (*)    All other components within normal limits  CBC WITH DIFFERENTIAL/PLATELET - Abnormal; Notable for the following components:   Hemoglobin 11.0 (*)    HCT 35.3 (*)    MCV 64.2 (*)    MCH 20.0 (*)    RDW 24.7 (*)    nRBC 0.6 (*)    All other components within normal limits  BASIC METABOLIC PANEL - Abnormal; Notable for the following components:   Glucose, Bld 143 (*)    Creatinine, Ser 0.60 (*)    All other components within normal limits  GLUCOSE, CAPILLARY - Abnormal; Notable for the following components:   Glucose-Capillary 136 (*)    All other components within normal limits  GLUCOSE, CAPILLARY - Abnormal; Notable for the following components:   Glucose-Capillary 104 (*)    All other components within normal limits  GLUCOSE, CAPILLARY - Abnormal; Notable for the following components:   Glucose-Capillary 129 (*)    All other components within normal limits  CBC  WITH DIFFERENTIAL/PLATELET - Abnormal; Notable for the following components:   Hemoglobin 9.8 (*)    HCT 30.9 (*)    MCV 63.4 (*)    MCH 20.1 (*)    RDW 24.2 (*)    Platelets 414 (*)    nRBC 0.3 (*)    All other components within normal limits  BASIC METABOLIC PANEL - Abnormal; Notable for the following components:   Glucose, Bld 128 (*)    Creatinine, Ser 0.54 (*)    All other components within normal limits  GLUCOSE, CAPILLARY - Abnormal; Notable for the following components:   Glucose-Capillary 127 (*)    All other components within normal limits  GLUCOSE, CAPILLARY - Abnormal; Notable for the following components:   Glucose-Capillary 145 (*)    All other components within normal limits  GLUCOSE, CAPILLARY - Abnormal; Notable for the following components:   Glucose-Capillary 137 (*)    All other components within normal limits  GLUCOSE, CAPILLARY - Abnormal; Notable for the following components:   Glucose-Capillary 140 (*)    All other components within normal limits  GLUCOSE, CAPILLARY - Abnormal; Notable for the following components:   Glucose-Capillary 128 (*)    All other components within normal limits  CBC WITH DIFFERENTIAL/PLATELET - Abnormal; Notable for the following components:   Hemoglobin 10.5 (*)    HCT 34.2 (*)    MCV 64.2 (*)    MCH 19.7 (*)    RDW 24.7 (*)    All other components within normal limits  BASIC METABOLIC PANEL - Abnormal; Notable for the following components:   Glucose, Bld 140 (*)    Creatinine, Ser 0.51 (*)    All other components within normal limits  GLUCOSE, CAPILLARY - Abnormal; Notable for the following components:   Glucose-Capillary 101 (*)    All other components within normal limits  GLUCOSE, CAPILLARY - Abnormal; Notable for the following components:   Glucose-Capillary 143 (*)    All other components within normal limits  GLUCOSE, CAPILLARY - Abnormal; Notable for the following components:   Glucose-Capillary 124 (*)    All  other components within normal limits  GLUCOSE, CAPILLARY - Abnormal; Notable for the following components:   Glucose-Capillary 112 (*)    All other components within normal limits  GLUCOSE, CAPILLARY - Abnormal; Notable for the following components:   Glucose-Capillary 119 (*)    All other components within normal limits  GLUCOSE, CAPILLARY - Abnormal; Notable for the following components:   Glucose-Capillary 124 (*)    All other components within normal limits  GLUCOSE, CAPILLARY - Abnormal; Notable for the following components:   Glucose-Capillary 105 (*)    All other components within normal limits  GLUCOSE, CAPILLARY - Abnormal; Notable for the following components:   Glucose-Capillary 108 (*)    All other components within normal limits  CULTURE, BLOOD (ROUTINE X 2)  CULTURE, BLOOD (ROUTINE X 2)  SARS CORONAVIRUS 2 (TAT 6-24 HRS)  MRSA PCR SCREENING  LIPASE, BLOOD  URINALYSIS, ROUTINE W REFLEX MICROSCOPIC  LACTIC ACID, PLASMA  HIV ANTIBODY (ROUTINE TESTING W REFLEX)  STREP PNEUMONIAE URINARY ANTIGEN  LEGIONELLA PNEUMOPHILA SEROGP 1 UR AG  PROCALCITONIN  PROTIME-INR  MAGNESIUM  MAGNESIUM  PHOSPHORUS  PHOSPHORUS  MAGNESIUM  PHOSPHORUS  MAGNESIUM  PHOSPHORUS  GLUCOSE, CAPILLARY  VITAMIN B12  FOLATE  IRON AND TIBC  FERRITIN  CBC WITH DIFFERENTIAL/PLATELET  BASIC METABOLIC PANEL  TROPONIN I (HIGH SENSITIVITY)  TROPONIN I (HIGH SENSITIVITY)    EKG None  Radiology No results found.  Procedures Procedures (including critical care time) Procedure note: Ultrasound Guided Peripheral IV Ultrasound guided peripheral 1.88 inch angiocath IV placement performed by me. Indications: Nursing unable to place IV. Details: The antecubital fossa and upper arm were evaluated with a multifrequency linear probe. Patent brachial veins were noted. 1 attempt was made to cannulate a vein under realtime US guidance with successful cannulation of the vein and catheter placement. There  is return of non-pulsatile dark red blood. The patient tolerated the procedure well without complications. Images archived electronically.  CPT codes: [phone] and [phone]  Medications Ordered in ED Medications  sodium chloride flush (NS) 0.9 % injection 10-40 mL (10 mLs Intracatheter Given 05/29/19 2200)  sodium chloride flush (NS) 0.9 % injection 10-40 mL (has no administration in time range)  haloperidol lactate (HALDOL) injection 2 mg (2 mg Intravenous Given 05/26/19 1047)  dronabinol (MARINOL) capsule 2.5 mg (2.5 mg Oral Not Given 05/29/19 1628)  lidocaine (XYLOCAINE) 1 % (with pres) injection (has no administration in time range)  metoprolol tartrate (LOPRESSOR) injection 2.5 mg (2.5 mg Intravenous Given 05/25/19 1720)  acetaminophen (TYLENOL) 160 MG/5ML solution 650 mg (650 mg Per Tube Given 05/29/19 2159)  rivaroxaban (XARELTO) tablet 20 mg (20 mg Per Tube Not Given 05/29/19 1628)  feeding supplement (PRO-STAT SUGAR FREE 64) liquid 30 mL (30 mLs Per Tube Given 05/29/19 1020)  free water 100 mL (100 mLs Per Tube Given 05/29/19 2033)  feeding supplement (OSMOLITE 1.2 CAL) liquid 1,000 mL (1,000 mLs Per Tube New Bag/Given 05/29/19 2204)  sodium chloride (PF) 0.9 % injection (has no administration in time range)  metoprolol tartrate (LOPRESSOR) tablet 12.5 mg (12.5 mg Oral Given 05/29/19 2159)  sodium chloride 0.9 % bolus 1,000 mL (0 mLs Intravenous Stopped 05/18/19 2058)  lidocaine-EPINEPHrine (XYLOCAINE W/EPI) 2 %-1:200000 (PF) injection 10 mL (10 mLs Intradermal  Given by Other 05/18/19 1543)  vancomycin (VANCOCIN) 1,250 mg in sodium chloride 0.9 % 250 mL IVPB (0 mg Intravenous Stopped 05/18/19 2058)  piperacillin-tazobactam (ZOSYN) IVPB 3.375 g (0 g Intravenous Stopped 05/18/19 1921)  ceFAZolin (ANCEF) IVPB 2g/100 mL premix (0 g Intravenous Stopped 05/25/19 0906)  lip balm (CARMEX) ointment (  Given 05/24/19 1814)  midazolam (VERSED) injection (0.5 mg Intravenous Given 05/25/19 0909)  iohexol  (OMNIPAQUE) 300 MG/ML solution 50 mL (12 mLs Other Contrast Given 05/25/19 0913)  fentaNYL (SUBLIMAZE) injection (50 mcg Intravenous Given 05/25/19 0853)  glucagon (GLUCAGEN) injection (0.5 mg Intravenous Given 05/25/19 0858)  lidocaine (PF) (XYLOCAINE) 1 % injection (10 mLs  Given 05/25/19 0900)  iohexol (OMNIPAQUE) 300 MG/ML solution 75 mL (75 mLs Intravenous Contrast Given 05/26/19 1231)  lip balm (CARMEX) ointment (  Given 05/27/19 1833)     Initial Impression / Assessment and Plan / ED Course  I have reviewed the triage vital signs and the nursing notes.  Pertinent labs & imaging results that were available during my care of the patient were reviewed by me and considered in my medical decision making (see chart for details).        78 yo M with a chief complaints of lethargy and decreased oral intake.  Going on for the past 2 or 3 days.  Patient was just in the hospital for presumed sepsis and pulmonary embolism.  He was thought to be doing better and was discharged home.  Since then has had very minimal intake has been just laying around per family and at one point was hypoxic on ambulation.  He was then sent here for evaluation.  Patient is demented does not provide much history.  He says no to direct questions on chest pain abdominal pain.  No obvious abdominal pain on exam.  Will obtain a laboratory evaluation give a bolus of IV fluids.  Patient with persistent pna on cxr. Discussed with family concerned he is not eating and drinking and does not getting around well at home.  Was noted to be hypoxic by home health nurse when he got up to minimally ambulate.  They are trying to work on getting him home oxygen as well as to gets IV fluids at home but has yet been unable to.  Will discuss with medicine for possible admission.  CRITICAL CARE Performed by: Cecilio Asper   Total critical care time: 35 minutes  Critical care time was exclusive of separately billable procedures and  treating other patients.  Critical care was necessary to treat or prevent imminent or life-threatening deterioration.  Critical care was time spent personally by me on the following activities: development of treatment plan with patient and/or surrogate as well as nursing, discussions with consultants, evaluation of patient's response to treatment, examination of patient, obtaining history from patient or surrogate, ordering and performing treatments and interventions, ordering and review of laboratory studies, ordering and review of radiographic studies, pulse oximetry and re-evaluation of patient's condition.   The patients results and plan were reviewed and discussed.   Any x-rays performed were independently reviewed by myself.   Differential diagnosis were considered with the presenting HPI.  Medications  sodium chloride flush (NS) 0.9 % injection 10-40 mL (10 mLs Intracatheter Given 05/29/19 2200)  sodium chloride flush (NS) 0.9 % injection 10-40 mL (has no administration in time range)  haloperidol lactate (HALDOL) injection 2 mg (2 mg Intravenous Given 05/26/19 1047)  dronabinol (MARINOL) capsule 2.5 mg (2.5 mg Oral Not  Given 05/29/19 1628)  lidocaine (XYLOCAINE) 1 % (with pres) injection (has no administration in time range)  metoprolol tartrate (LOPRESSOR) injection 2.5 mg (2.5 mg Intravenous Given 05/25/19 1720)  acetaminophen (TYLENOL) 160 MG/5ML solution 650 mg (650 mg Per Tube Given 05/29/19 2159)  rivaroxaban (XARELTO) tablet 20 mg (20 mg Per Tube Not Given 05/29/19 1628)  feeding supplement (PRO-STAT SUGAR FREE 64) liquid 30 mL (30 mLs Per Tube Given 05/29/19 1020)  free water 100 mL (100 mLs Per Tube Given 05/29/19 2033)  feeding supplement (OSMOLITE 1.2 CAL) liquid 1,000 mL (1,000 mLs Per Tube New Bag/Given 05/29/19 2204)  sodium chloride (PF) 0.9 % injection (has no administration in time range)  metoprolol tartrate (LOPRESSOR) tablet 12.5 mg (12.5 mg Oral Given 05/29/19  2159)  sodium chloride 0.9 % bolus 1,000 mL (0 mLs Intravenous Stopped 05/18/19 2058)  lidocaine-EPINEPHrine (XYLOCAINE W/EPI) 2 %-1:200000 (PF) injection 10 mL (10 mLs Intradermal Given by Other 05/18/19 1543)  vancomycin (VANCOCIN) 1,250 mg in sodium chloride 0.9 % 250 mL IVPB (0 mg Intravenous Stopped 05/18/19 2058)  piperacillin-tazobactam (ZOSYN) IVPB 3.375 g (0 g Intravenous Stopped 05/18/19 1921)  ceFAZolin (ANCEF) IVPB 2g/100 mL premix (0 g Intravenous Stopped 05/25/19 0906)  lip balm (CARMEX) ointment (  Given 05/24/19 1814)  midazolam (VERSED) injection (0.5 mg Intravenous Given 05/25/19 0909)  iohexol (OMNIPAQUE) 300 MG/ML solution 50 mL (12 mLs Other Contrast Given 05/25/19 0913)  fentaNYL (SUBLIMAZE) injection (50 mcg Intravenous Given 05/25/19 0853)  glucagon (GLUCAGEN) injection (0.5 mg Intravenous Given 05/25/19 0858)  lidocaine (PF) (XYLOCAINE) 1 % injection (10 mLs  Given 05/25/19 0900)  iohexol (OMNIPAQUE) 300 MG/ML solution 75 mL (75 mLs Intravenous Contrast Given 05/26/19 1231)  lip balm (CARMEX) ointment (  Given 05/27/19 1833)    Vitals:   05/28/19 1919 05/29/19 0411 05/29/19 1332 05/29/19 1926  BP: 114/76 124/75 130/69 131/80  Pulse: 94 92 86 91  Resp: 15 15 14 15   Temp: 97.7 F (36.5 C) 97.7 F (36.5 C)    TempSrc: Oral Oral    SpO2: 98% 98% 98% 100%  Weight:      Height:        Final diagnoses:  HCAP (healthcare-associated pneumonia)    Admission/ observation were discussed with the admitting physician, patient and/or family and they are comfortable with the plan.     Final Clinical Impressions(s) / ED Diagnoses   Final diagnoses:  HCAP (healthcare-associated pneumonia)    ED Discharge Orders    None       Deno Etienne, DO 05/18/19 [place], [place], DO 05/30/19 [phone]

## 2019-05-18 NOTE — H&P (Signed)
TRH H&P    Patient Demographics:    Richard [name], is a 78 y.o. male  MRN: [id number]  DOB - [birth year]  Admit Date - 05/18/2019  Referring MD/NP/PA: Tyrone Nine  Outpatient Primary MD for the patient is [name], NP  Patient coming from:  home  Chief complaint-  Hypoxia ?   HPI:    Richard [name]  is a 78 y.o. male, w dementia, hypertension, hyperlipidemia, h/o  Afib with RVR, h/o R common femoral vein DVT 01/31/2019,  Stage 3 non-small cell lung cancer was at home, visited by home health and o2 sat was low and thus sent to ER for evaluation of failure to thrive and hypoxia. Slight dry cough.  No fever, no chills, no cp,  No palp, no n/v, no diarrhea.   In ED,  T 97.8, P 114, R 22, Bp 114/74  Pox 95% on  Wt 63.6 kg  Wbc 6.4, Hgb 11.3, Plt 394 Na 141, K 4.3 Bun 14, Creatinine 0.71 Alb 3.0 Ast 19, Alt 14 Lipase 21 Blood culture x2 Trop 5 Lactic acid 2.5-> 1.8  covid -19 pending  Pt will be admitted for failure to thrive, and hypoxia secondary to Hcap      Review of systems:    In addition to the HPI above,  No Fever-chills, No Headache, No changes with Vision or hearing, No problems swallowing food or Liquids, No Chest pain,  No Abdominal pain, No Nausea or Vomiting, bowel movements are regular, No Blood in stool or Urine, No dysuria, No new skin rashes or bruises, No new joints pains-aches,  No new weakness, tingling, numbness in any extremity, No recent weight gain or loss, No polyuria, polydypsia or polyphagia, No significant Mental Stressors.  All other systems reviewed and are negative.    Past History of the following :    Past Medical History:  Diagnosis Date  . Dementia ([place])   . High cholesterol   . Hypertension   . Lung mass 12/2018      Past Surgical History:  Procedure Laterality Date  . JOINT REPLACEMENT    . VIDEO BRONCHOSCOPY WITH ENDOBRONCHIAL  ULTRASOUND N/A 12/16/2018   Procedure: VIDEO BRONCHOSCOPY WITH ENDOBRONCHIAL ULTRASOUND AND FLUROSCOPY;  Surgeon: [name], MD;  Location: [facility];  Service: Pulmonary;  Laterality: N/A;      Social History:      Social History   Tobacco Use  . Smoking status: Current Some Day Smoker    Packs/day: 1.00    Years: 60.00    Pack years: 60.00    Types: Cigarettes  . Smokeless tobacco: Never Used  Substance Use Topics  . Alcohol use: Yes    Comment: occasional       Family History :     Family History  Problem Relation Age of Onset  . Stomach cancer Mother   . COPD Father   . Lung cancer Brother   . Lung cancer Brother        Home Medications:   Prior to Admission medications  Medication Sig Start Date End Date Taking? Authorizing Provider  ferrous sulfate 325 (65 FE) MG tablet Take 325 mg by mouth 2 (two) times daily with a meal.    [provider]  Multiple Vitamin (MULTIVITAMIN) tablet Take 1 tablet by mouth daily.    [provider]  Omega-3 Fatty Acids (FISH OIL) 1200 MG CAPS Take 1,200 mg by mouth daily.    [provider]  prochlorperazine (COMPAZINE) 10 MG tablet Take 1 tablet (10 mg total) by mouth every 6 (six) hours as needed for nausea or vomiting. 01/12/19   Heilingoetter, Cassandra L, PA-C  rivaroxaban (XARELTO) 20 MG TABS tablet Take 1 tablet (20 mg total) by mouth daily with supper. 04/20/19   Curt Bears, MD     Allergies:    No Known Allergies   Physical Exam:   Vitals  Blood pressure (!) 95/58, pulse (!) 112, temperature 97.8 F (36.6 C), temperature source Oral, resp. rate 20, height 6\' 2"  (1.88 m), weight 63.6 kg, SpO2 97 %.  1.  General: axoxo1 (person), not place or time  2. Psychiatric: euthymic  3. Neurologic: Able to move all 4 ext,   4. HEENMT:  Anicteric, pupils 1.37mm symmetric, direct, consensual intact Large tongue, midline Neck: no jvd  5. Respiratory : Slight crackles at the right  lung base, no wheezing  6. Cardiovascular : Irr, irr, s1, s2, no m/g/r  7. Gastrointestinal:  Abd: soft, nt, nd, +bs  8. Skin:  Ext: no c/c/e,  No rash  9.Musculoskeletal:  Good ROM    Data Review:    CBC Recent Labs  Lab 05/18/19 1615  WBC 6.4  HGB 11.3*  HCT 35.8*  PLT 394  MCV 64.5*  MCH 20.4*  MCHC 31.6  RDW 25.3*  LYMPHSABS 0.9  MONOABS 0.9  EOSABS 0.0  BASOSABS 0.0   ------------------------------------------------------------------------------------------------------------------  Results for orders placed or performed during the hospital encounter of 05/18/19 (from the past 48 hour(s))  CBC with Differential     Status: Abnormal   Collection Time: 05/18/19  4:15 PM  Result Value Ref Range   WBC 6.4 4.0 - 10.5 K/uL   RBC 5.55 4.22 - 5.81 MIL/uL   Hemoglobin 11.3 (L) 13.0 - 17.0 g/dL   HCT 35.8 (L) 39.0 - 52.0 %   MCV 64.5 (L) 80.0 - 100.0 fL   MCH 20.4 (L) 26.0 - 34.0 pg   MCHC 31.6 30.0 - 36.0 g/dL   RDW 25.3 (H) 11.5 - 15.5 %   Platelets 394 150 - 400 K/uL   nRBC 0.5 (H) 0.0 - 0.2 %   Neutrophils Relative % 69 %   Neutro Abs 4.5 1.7 - 7.7 K/uL   Lymphocytes Relative 14 %   Lymphs Abs 0.9 0.7 - 4.0 K/uL   Monocytes Relative 14 %   Monocytes Absolute 0.9 0.1 - 1.0 K/uL   Eosinophils Relative 1 %   Eosinophils Absolute 0.0 0.0 - 0.5 K/uL   Basophils Relative 1 %   Basophils Absolute 0.0 0.0 - 0.1 K/uL   Immature Granulocytes 1 %   Abs Immature Granulocytes 0.03 0.00 - 0.07 K/uL    Comment: Performed at [facility], [place] [address], [place], [place] [address]  Comprehensive metabolic panel     Status: Abnormal   Collection Time: 05/18/19  4:15 PM  Result Value Ref Range   Sodium 141 135 - 145 mmol/L   Potassium 4.3 3.5 - 5.1 mmol/L   Chloride 102 98 -  111 mmol/L   CO2 28 22 - 32 mmol/L   Glucose, Bld 132 (H) 70 - 99 mg/dL   BUN 14 8 - 23 mg/dL   Creatinine, Ser 0.71 0.61 - 1.24 mg/dL   Calcium 9.5 8.9 - 10.3 mg/dL    Total Protein 7.5 6.5 - 8.1 g/dL   Albumin 3.0 (L) 3.5 - 5.0 g/dL   AST 19 15 - 41 U/L   ALT 14 0 - 44 U/L   Alkaline Phosphatase 82 38 - 126 U/L   Total Bilirubin 0.6 0.3 - 1.2 mg/dL   GFR calc non Af Amer >60 >60 mL/min   GFR calc Af Amer >60 >60 mL/min   Anion gap 11 5 - 15    Comment: Performed at [facility], [place] [address] [place], [place] [address]  Lipase, blood     Status: None   Collection Time: 05/18/19  4:15 PM  Result Value Ref Range   Lipase 21 11 - 51 U/L    Comment: Performed at [facility], [place] [address] [place], [place] [address]  Lactic acid, plasma     Status: Abnormal   Collection Time: 05/18/19  4:15 PM  Result Value Ref Range   Lactic Acid, Venous 2.5 (HH) 0.5 - 1.9 mmol/L    Comment: CRITICAL RESULT CALLED TO, READ BACK BY AND VERIFIED WITH: Adelene Idler [id number] @ 1062 BY J SCOTTON Performed at [place] [address], [place], [place] [address]   Troponin I (High Sensitivity)     Status: None   Collection Time: 05/18/19  4:15 PM  Result Value Ref Range   Troponin I (High Sensitivity) 5 <18 ng/L    Comment: (NOTE) Elevated high sensitivity troponin I (hsTnI) values and significant  changes across serial measurements may suggest ACS but many other  chronic and acute conditions are known to elevate hsTnI results.  Refer to the "Links" section for chest pain algorithms and additional  guidance. Performed at [facility], [place] [address] [place], [place] [address]   Lactic acid, plasma     Status: None   Collection Time: 05/18/19  5:45 PM  Result Value Ref Range   Lactic Acid, Venous 1.8 0.5 - 1.9 mmol/L    Comment: Performed at [facility], [place] [address] [place], [place] [address]  Troponin I (High Sensitivity)     Status: None   Collection Time: 05/18/19  5:45 PM  Result Value Ref Range   Troponin I (High Sensitivity) 5 <18 ng/L    Comment:  (NOTE) Elevated high sensitivity troponin I (hsTnI) values and significant  changes across serial measurements may suggest ACS but many other  chronic and acute conditions are known to elevate hsTnI results.  Refer to the "Links" section for chest pain algorithms and additional  guidance. Performed at [facility], [place] [address], [place], [place] [address]     Chemistries  Recent Labs  Lab 05/18/19 1615  NA 141  K 4.3  CL 102  CO2 28  GLUCOSE 132*  BUN 14  CREATININE 0.71  CALCIUM 9.5  AST 19  ALT 14  ALKPHOS 82  BILITOT 0.6   ------------------------------------------------------------------------------------------------------------------  ------------------------------------------------------------------------------------------------------------------ GFR: Estimated Creatinine Clearance: 70.7 mL/min (by C-G formula based on SCr of 0.71 mg/dL). Liver Function Tests: Recent Labs  Lab 05/18/19 1615  AST 19  ALT 14  ALKPHOS 82  BILITOT 0.6  PROT 7.5  ALBUMIN 3.0*  Recent Labs  Lab 05/18/19 1615  LIPASE 21   No results for input(s): AMMONIA in the last 168 hours. Coagulation Profile: No results for input(s): INR, PROTIME in the last 168 hours. Cardiac Enzymes: No results for input(s): CKTOTAL, CKMB, CKMBINDEX, TROPONINI in the last 168 hours. BNP (last 3 results) No results for input(s): PROBNP in the last 8760 hours. HbA1C: No results for input(s): HGBA1C in the last 72 hours. CBG: No results for input(s): GLUCAP in the last 168 hours. Lipid Profile: No results for input(s): CHOL, HDL, LDLCALC, TRIG, CHOLHDL, LDLDIRECT in the last 72 hours. Thyroid Function Tests: No results for input(s): TSH, T4TOTAL, FREET4, T3FREE, THYROIDAB in the last 72 hours. Anemia Panel: No results for input(s): VITAMINB12, FOLATE, FERRITIN, TIBC, IRON, RETICCTPCT in the last 72 hours.   --------------------------------------------------------------------------------------------------------------- Urine analysis:    Component Value Date/Time   COLORURINE YELLOW 05/08/2019 1844   APPEARANCEUR HAZY (A) 05/08/2019 1844   LABSPEC 1.016 05/08/2019 1844   PHURINE 8.0 05/08/2019 1844   GLUCOSEU NEGATIVE 05/08/2019 1844   HGBUR NEGATIVE 05/08/2019 1844   BILIRUBINUR NEGATIVE 05/08/2019 1844   KETONESUR NEGATIVE 05/08/2019 1844   PROTEINUR NEGATIVE 05/08/2019 1844   NITRITE NEGATIVE 05/08/2019 1844   LEUKOCYTESUR NEGATIVE 05/08/2019 1844      Imaging Results:    Dg Chest Port 1 View  Result Date: 05/18/2019 CLINICAL DATA:  Failure to thrive EXAM: PORTABLE CHEST 1 VIEW COMPARISON:  Portable exam 1522 hours compared to 05/08/2019 FINDINGS: Normal heart size, mediastinal contours, and pulmonary vascularity. Atherosclerotic calcification aorta. Emphysematous changes and bronchitic changes consistent with COPD. Persistent opacity at RIGHT lung base question pneumonia. Radiographic follow-up until resolution recommended to exclude underlying abnormalities including tumor. Minimal atelectasis or infiltrate at LEFT base. No pleural effusion or pneumothorax. Bones demineralized. IMPRESSION: COPD changes with with bibasilar atelectasis versus infiltrate much greater on RIGHT; radiographic follow-up until resolution recommended to exclude underlying abnormalities including tumor. Aortic Atherosclerosis (ICD10-I70.0). Electronically Signed   By: Lavonia Dana M.D.   On: 05/18/2019 15:46       Assessment & Plan:    Principal Problem:   HCAP (healthcare-associated pneumonia)  Acute respiratory failure with hypoxia  Hcap  Blood culture x2 Urine strep antigen Urine legionella antigen vanco iv, cefepime iv pharmacy to dose  Tachycardia 12 lead ekg Trop I Consider cardiac echo if persistent  Pafib, h/o R common femoral vein DVT Cont Xarelto pharmacy to dose  FTT Hydrate with ns iv   Severe protein calorie malnutrition prostat 30 ml po bid  Hx of Dementia monitor  H/o stage 3a non-small cell carcinoma F/u with Julien Nordmann   DVT Prophylaxis-   Lovenox - SCDs   AM Labs Ordered, also please review Full Orders  Family Communication: Admission, patients condition and plan of care including tests being ordered have been discussed with the patient  who indicate understanding and agree with the plan and Code Status.  Code Status:  DNR per patient, DNR confirmed by daughter   Admission status: Inpatient: Based on patients clinical presentation and evaluation of above clinical data, I have made determination that patient meets Inpatient criteria at this time. Pt has high risk of clinical deterioration,  Pt will require iv abx,  Pt will require > 2 nites stay.   Time spent in minutes : 70 minutes    Jani Gravel M.D on 05/18/2019 at 7:49 PM

## 2019-05-18 NOTE — Progress Notes (Signed)
Pharmacy Antibiotic Note  Richard [name] is a 78 y.o. male admitted on 05/18/2019 with pneumonia.  Patient received Zosyn 3.375gm IV x 1 dose and Vancomycin 1250mg  IV x 1 dose in the ED.  Pharmacy has been consulted for Vancomycin and Cefepime dosing.  Plan:  Cefepime 2gm IV q8h Vancomycin 750 mg IV Q 12 hrs. Goal AUC 400-550; Expected AUC: 519.5; SCr used: 0.8  F/u culture results and sensitivities  F/u renal function and clinical improvement  Height: 6\' 2"  (188 cm) Weight: 140 lb 3.4 oz (63.6 kg) IBW/kg (Calculated) : 82.2  Temp (24hrs), Avg:97.8 F (36.6 C), Min:97.8 F (36.6 C), Max:97.8 F (36.6 C)  Recent Labs  Lab 05/18/19 1615 05/18/19 1745  WBC 6.4  --   CREATININE 0.71  --   LATICACIDVEN 2.5* 1.8    Estimated Creatinine Clearance: 70.7 mL/min (by C-G formula based on SCr of 0.71 mg/dL).    No Known Allergies  Antimicrobials this admission: 12/2 Zosyn x 1 dose 12/2 Vanc >>   12/3 Cefepime >>  Dose adjustments this admission:    Microbiology results: 12/2 BCx: sent 12/2 Covid: sent  Thank you for allowing pharmacy to be a part of this patient's care.  Everette Rank, PharmD 05/18/2019 8:26 PM

## 2019-05-18 NOTE — ED Notes (Signed)
Pt provided with Tomato Soup, Crackers and a Ginger-Ale.

## 2019-05-18 NOTE — Telephone Encounter (Signed)
Received call from pt's [place], Rory Percy, RN.  This is pt's first visit s/p hospitalization for sepsis.  She states that pt is very lethargic. BP 100/60 but not orthostatic. S02 while sitting is 95% but drops to 85% when ambulating. HR 80-102. Afebrile.  She states that the family tells her pt is not eating or drinking, is irritable and wants to be left alone.  Family states he has had 1 Ensure in the last 24 hours.  Pt's urine is dark in color.  Pt has not had a BM in 1 week per the family. [facility] nurse states pt has no bowel sounds.  Pt would not cooperate with abdominal palpation so unsure if abd pain and or location if has pain. Denies vomiting.  Spoke with Cassie Heilingoetter, PA and Dr. Julien Nordmann.  Due to variety of symptoms that may or may not be related to cancer diagnosis, the recommendation is for pt to be be evaluated in the ED.  Rory Percy, RN made aware of the above. She will discuss with family for non-emergent EMS transport to ED

## 2019-05-18 NOTE — ED Notes (Signed)
IV attempt x1. Patient demanded this RN to stop attempt. Refusing additional sticks. MD made aware.

## 2019-05-19 LAB — HIV ANTIBODY (ROUTINE TESTING W REFLEX): HIV Screen 4th Generation wRfx: NONREACTIVE

## 2019-05-19 LAB — SARS CORONAVIRUS 2 (TAT 6-24 HRS): SARS Coronavirus 2: NEGATIVE

## 2019-05-19 LAB — URINALYSIS, ROUTINE W REFLEX MICROSCOPIC
Bilirubin Urine: NEGATIVE
Glucose, UA: NEGATIVE mg/dL
Hgb urine dipstick: NEGATIVE
Ketones, ur: NEGATIVE mg/dL
Leukocytes,Ua: NEGATIVE
Nitrite: NEGATIVE
Protein, ur: NEGATIVE mg/dL
Specific Gravity, Urine: 1.02 (ref 1.005–1.030)
pH: 8 (ref 5.0–8.0)

## 2019-05-19 LAB — STREP PNEUMONIAE URINARY ANTIGEN: Strep Pneumo Urinary Antigen: NEGATIVE

## 2019-05-19 MED ORDER — SODIUM CHLORIDE 0.9% FLUSH
10.0000 mL | Freq: Two times a day (BID) | INTRAVENOUS | Status: DC
  Administered 2019-05-19 – 2019-05-23 (×5): 10 mL
  Administered 2019-05-25: 20 mL
  Administered 2019-05-25 – 2019-05-29 (×7): 10 mL

## 2019-05-19 MED ORDER — HALOPERIDOL LACTATE 5 MG/ML IJ SOLN
2.0000 mg | Freq: Four times a day (QID) | INTRAMUSCULAR | Status: DC | PRN
  Administered 2019-05-19 – 2019-05-26 (×3): 2 mg via INTRAVENOUS
  Filled 2019-05-19 (×3): qty 1

## 2019-05-19 MED ORDER — SODIUM CHLORIDE 0.9% FLUSH: 10.0000 mL | INTRAVENOUS | Status: DC | PRN

## 2019-05-19 MED ORDER — VANCOMYCIN HCL IN DEXTROSE 1-5 GM/200ML-% IV SOLN
1000.0000 mg | INTRAVENOUS | Status: DC
  Administered 2019-05-19: 1000 mg via INTRAVENOUS
  Filled 2019-05-19: qty 200

## 2019-05-19 MED ORDER — ENSURE ENLIVE PO LIQD
237.0000 mL | Freq: Two times a day (BID) | ORAL | Status: DC
  Administered 2019-05-19 – 2019-05-25 (×11): 237 mL via ORAL

## 2019-05-19 NOTE — Progress Notes (Signed)
Update provided to patients daughter [name] regarding plan of care. No questions or additional concerns at this time

## 2019-05-19 NOTE — Progress Notes (Signed)
Initial Nutrition Assessment  RD working remotely.   DOCUMENTATION CODES:   Underweight  INTERVENTION:  - continue Ensure Enlive BID, each supplement provides 350 kcal and 20 grams of protein. - continue 30 mL Prostat BID, each supplement provides 100 kcal and 15 grams of protein. - continue to encourage PO intakes. - will continue to monitor for nutrition-related needs and perform NFPE at follow-up, if feasible.    NUTRITION DIAGNOSIS:   Increased nutrient needs related to chronic illness, cancer and cancer related treatments, acute illness as evidenced by estimated needs.  GOAL:   Patient will meet greater than or equal to 90% of their needs  MONITOR:   PO intake, Supplement acceptance, Labs, Weight trends, Skin  REASON FOR ASSESSMENT:   Malnutrition Screening Tool  ASSESSMENT:   78 year old male with dementia, A. fib with RVR, HTN, hyperlipidemia, history of R common femoral DVT on 01/31/2019, and stage 3 NSCLC. He presented to the ED from home with poor oral intake and hypoxia. CXR in the EDD showed COPD changes with bibasilar atelectasis vs infiltrate (R much greater than L). COVID-19 test negative.  Per flow sheet documentation, patient consumed 25% of lunch today. Ensure Enlive and 30 ml prostat both ordered BID and patient has accepted 100% of all supplements offered.   Per chart review, current weight is 134 lb and on 11/22 he weighed 140 lb and on 11/16 he weighed 144 lb. Patient likely meets criteria for malnutrition but unable to confirm with inability to perform NFPE today and inability to call patient on the phone d/t him being a/o to self only.   Per notes: - HCAP - severe protein-calorie malnutrition - afib - dementia  Labs reviewed. Medications reviewed.    NUTRITION - FOCUSED PHYSICAL EXAM:  unable to complete at this time  Diet Order:   Diet Order            Diet Heart Room service appropriate? Yes; Fluid consistency: Thin  Diet effective now               EDUCATION NEEDS:   No education needs have been identified at this time  Skin:  Skin Assessment: Skin Integrity Issues: Skin Integrity Issues:: Stage I, Stage II Stage I: bilateral hips Stage II: L hip  Last BM:  12/2  Height:   Ht Readings from Last 1 Encounters:  05/19/19 6\' 2"  (1.88 m)    Weight:   Wt Readings from Last 1 Encounters:  05/19/19 60.9 kg    Ideal Body Weight:  86.4 kg  BMI:  Body mass index is 17.24 kg/m.  Estimated Nutritional Needs:   Kcal:  1900-2100 kcal  Protein:  95-105 grams  Fluid:  >/= 2 L/day      Jarome Matin, MS, RD, LDN, [facility] Inpatient Clinical Dietitian Pager # [phone] After hours/weekend pager # [phone]

## 2019-05-19 NOTE — Progress Notes (Addendum)
PROGRESS NOTE    Yeng Perz   LGX:[id number]  DOB: [birth year]  DOA: 05/18/2019 PCP: [name], NP   Brief Narrative:  Romin Divita is a 78 year old male with dementia, A. fib with RVR, hypertension, hyperlipidemia, history of right common femoral DVT on 01/31/2019, stage III non-small cell lung cancer who presents from home with poor oral intake and hypoxia on ambulation when checked by his home health RN. Chest x-ray in CX:KGYJ changes with with bibasilar atelectasis versus infiltrate much greater on RIGHT Lactic acid 2.5 Covid negative Tachycardic blood pressure as low as 95/58  Subjective: Patient is quite confused.  He states he wants to go home.    Assessment & Plan:   Principal Problem:   HCAP (healthcare-associated pneumonia) -Right lower lobe infiltrate with lactic acidosis, tachycardia, hypotension -Lactic acidosis has improved after IV fluids however he continues to be tachycardic - Continue Vancomycin and Cefepime- check MRSA PCR  Active Problems: Non-small cell cancer-diagnosed in July 2020 -Most recent CT was in August 2020 and revealed a spiculated perihilar mass with obstruction of the right middle lobe and pretracheal enlarged lymph nodes.  He also had a pulmonary nodule in the left lower lobe and a moderate right-sided pleural effusion. -He has received 8 cycles of chemotherapy Dr. Julien Nordmann  --There has not been a follow-up CT yet    Protein-calorie malnutrition, severe ([place]) - continue supplements- the patient does not appear to have and appetite which may be related to underlying cancer or chemo - follow    A-fib - cont Xarelto- not on any rate controlling agent - in NSR  Dementia - follow for behavioral disturbances- might need sitter or medications as he did so on last admission   h/o DVT - cont Xarelto    Time spent in minutes: 35 DVT prophylaxis: Xarelto Code Status: DNR Family Communication:  Disposition Plan: to be determined-  f/u on PT eval Consultants:   none Procedures:   none Antimicrobials:  Anti-infectives (From admission, onward)   Start     Dose/Rate Route Frequency Ordered Stop   05/19/19 1800  vancomycin (VANCOCIN) IVPB 1000 mg/200 mL premix     1,000 mg 200 mL/hr over 60 Minutes Intravenous Every 24 hours 05/19/19 1141     05/19/19 0800  vancomycin (VANCOCIN) IVPB 750 mg/150 ml premix  Status:  Discontinued     750 mg 150 mL/hr over 60 Minutes Intravenous Every 12 hours 05/18/19 2025 05/19/19 1139   05/19/19 0200  ceFEPIme (MAXIPIME) 2 g in sodium chloride 0.9 % 100 mL IVPB     2 g 200 mL/hr over 30 Minutes Intravenous Every 8 hours 05/18/19 2025     05/18/19 1815  vancomycin (VANCOCIN) 1,250 mg in sodium chloride 0.9 % 250 mL IVPB     1,250 mg 166.7 mL/hr over 90 Minutes Intravenous  Once 05/18/19 1806 05/18/19 2058   05/18/19 1815  piperacillin-tazobactam (ZOSYN) IVPB 3.375 g     3.375 g 100 mL/hr over 30 Minutes Intravenous  Once 05/18/19 1806 05/18/19 1921       Objective: Vitals:   05/19/19 0325 05/19/19 0326 05/19/19 0917 05/19/19 1309  BP:  113/66 (!) 100/56 100/68  Pulse:  (!) 103 100 99  Resp:  20 16 20   Temp:  (!) 97.4 F (36.3 C) 98.4 F (36.9 C) 98.7 F (37.1 C)  TempSrc:  Oral Oral Oral  SpO2:  92% 100% 98%  Weight: 60.9 kg     Height: 6\' 2"  (1.88 m)  Intake/Output Summary (Last 24 hours) at 05/19/2019 1332 Last data filed at 05/19/2019 1308 Gross per 24 hour  Intake 1550 ml  Output 325 ml  Net 1225 ml   Filed Weights   05/18/19 1450 05/19/19 0325  Weight: 63.6 kg 60.9 kg    Examination: General exam: Appears comfortable  HEENT: PERRLA, oral mucosa moist, no sclera icterus or thrush Respiratory system: Crackles in RLL. Respiratory effort normal. Cardiovascular system: S1 & S2 heard, RRR.   Gastrointestinal system: Abdomen soft, non-tender, nondistended. Normal bowel sounds. Central nervous system: Alert and oriented only to person. No focal  neurological deficits. Extremities: No cyanosis, clubbing or edema Skin: No rashes or ulcers Psychiatry:  Mood & affect appropriate.     Data Reviewed: I have personally reviewed following labs and imaging studies  CBC: Recent Labs  Lab 05/18/19 1615  WBC 6.4  NEUTROABS 4.5  HGB 11.3*  HCT 35.8*  MCV 64.5*  PLT 657   Basic Metabolic Panel: Recent Labs  Lab 05/18/19 1615  NA 141  K 4.3  CL 102  CO2 28  GLUCOSE 132*  BUN 14  CREATININE 0.71  CALCIUM 9.5   GFR: Estimated Creatinine Clearance: 67.7 mL/min (by C-G formula based on SCr of 0.71 mg/dL). Liver Function Tests: Recent Labs  Lab 05/18/19 1615  AST 19  ALT 14  ALKPHOS 82  BILITOT 0.6  PROT 7.5  ALBUMIN 3.0*   Recent Labs  Lab 05/18/19 1615  LIPASE 21   No results for input(s): AMMONIA in the last 168 hours. Coagulation Profile: No results for input(s): INR, PROTIME in the last 168 hours. Cardiac Enzymes: No results for input(s): CKTOTAL, CKMB, CKMBINDEX, TROPONINI in the last 168 hours. BNP (last 3 results) No results for input(s): PROBNP in the last 8760 hours. HbA1C: No results for input(s): HGBA1C in the last 72 hours. CBG: No results for input(s): GLUCAP in the last 168 hours. Lipid Profile: No results for input(s): CHOL, HDL, LDLCALC, TRIG, CHOLHDL, LDLDIRECT in the last 72 hours. Thyroid Function Tests: No results for input(s): TSH, T4TOTAL, FREET4, T3FREE, THYROIDAB in the last 72 hours. Anemia Panel: No results for input(s): VITAMINB12, FOLATE, FERRITIN, TIBC, IRON, RETICCTPCT in the last 72 hours. Urine analysis:    Component Value Date/Time   COLORURINE YELLOW 05/08/2019 1844   APPEARANCEUR HAZY (A) 05/08/2019 1844   LABSPEC 1.016 05/08/2019 1844   PHURINE 8.0 05/08/2019 1844   GLUCOSEU NEGATIVE 05/08/2019 1844   HGBUR NEGATIVE 05/08/2019 1844   BILIRUBINUR NEGATIVE 05/08/2019 1844   KETONESUR NEGATIVE 05/08/2019 1844   PROTEINUR NEGATIVE 05/08/2019 1844   NITRITE NEGATIVE  05/08/2019 1844   LEUKOCYTESUR NEGATIVE 05/08/2019 1844   Sepsis Labs: @LABRCNTIP (procalcitonin:4,lacticidven:4) ) Recent Results (from the past 240 hour(s))  Culture, blood (routine x 2)     Status: None (Preliminary result)   Collection Time: 05/18/19  4:15 PM   Specimen: Right Antecubital; Blood  Result Value Ref Range Status   Specimen Description   Final    RIGHT ANTECUBITAL Performed at [facility], [place] [address] [place], [place] [address]    Special Requests   Final    BOTTLES DRAWN AEROBIC AND ANAEROBIC Blood Culture adequate volume Performed at [place] [address], [place], [place] [address]    Culture   Final    NO GROWTH < 12 HOURS Performed at [place] [address], [place], [place] [address]    Report Status PENDING  Incomplete  SARS CORONAVIRUS  2 (TAT 6-24 HRS) Nasopharyngeal Nasopharyngeal Swab     Status: None   Collection Time: 05/18/19  6:19 PM   Specimen: Nasopharyngeal Swab  Result Value Ref Range Status   SARS Coronavirus 2 NEGATIVE NEGATIVE Final    Comment: (NOTE) SARS-CoV-2 target nucleic acids are NOT DETECTED. The SARS-CoV-2 RNA is generally detectable in upper and lower respiratory specimens during the acute phase of infection. Negative results do not preclude SARS-CoV-2 infection, do not rule out co-infections with other pathogens, and should not be used as the sole basis for treatment or other patient management decisions. Negative results must be combined with clinical observations, patient history, and epidemiological information. The expected result is Negative. Fact Sheet for Patients: SugarRoll.be Fact Sheet for Healthcare Providers: https://www.woods-mathews.com/ This test is not yet approved or cleared by the [place] FDA and  has been authorized for detection and/or diagnosis of SARS-CoV-2 by FDA under an Emergency Use  Authorization (EUA). This EUA will remain  in effect (meaning this test can be used) for the duration of the COVID-19 declaration under Section 56 4(b)(1) of the Act, 21 U.S.C. section 360bbb-3(b)(1), unless the authorization is terminated or revoked sooner. Performed at [facility], [place] [address], [place], [place] [address]          Radiology Studies: Dg Chest Port 1 View  Result Date: 05/18/2019 CLINICAL DATA:  Failure to thrive EXAM: PORTABLE CHEST 1 VIEW COMPARISON:  Portable exam 1522 hours compared to 05/08/2019 FINDINGS: Normal heart size, mediastinal contours, and pulmonary vascularity. Atherosclerotic calcification aorta. Emphysematous changes and bronchitic changes consistent with COPD. Persistent opacity at RIGHT lung base question pneumonia. Radiographic follow-up until resolution recommended to exclude underlying abnormalities including tumor. Minimal atelectasis or infiltrate at LEFT base. No pleural effusion or pneumothorax. Bones demineralized. IMPRESSION: COPD changes with with bibasilar atelectasis versus infiltrate much greater on RIGHT; radiographic follow-up until resolution recommended to exclude underlying abnormalities including tumor. Aortic Atherosclerosis (ICD10-I70.0). Electronically Signed   By: Lavonia Dana M.D.   On: 05/18/2019 15:46      Scheduled Meds: . feeding supplement (ENSURE ENLIVE)  237 mL Oral BID BM  . feeding supplement (PRO-STAT SUGAR FREE 64)  30 mL Oral BID  . rivaroxaban  20 mg Oral Q supper  . sodium chloride flush  10-40 mL Intracatheter Q12H   Continuous Infusions: . ceFEPime (MAXIPIME) IV 2 g (05/19/19 0942)  . vancomycin       LOS: 1 day      Debbe Odea, MD [facility] Pager: [email or web address] Password [email or web address] 05/19/2019, 1:32 PM

## 2019-05-19 NOTE — Progress Notes (Signed)
Patients daughter requested RN to come to the room. Upon entry patients daughter stated that when speaking to the admitting physician last night a full code status was requested for the patient. Daughter stated that perhaps she misunderstood. Hospitalist provider paged regarding patient's code status.

## 2019-05-19 NOTE — ED Notes (Signed)
ED TO INPATIENT HANDOFF REPORT  Name/Age/Gender Richard [name] 78 y.o. male  Code Status    Code Status Orders  (From admission, onward)         Start     Ordered   05/18/19 1953  Do not attempt resuscitation (DNR)  Continuous    Question Answer Comment  In the event of cardiac or respiratory ARREST Do not call a "code blue"   In the event of cardiac or respiratory ARREST Do not perform Intubation, CPR, defibrillation or ACLS   In the event of cardiac or respiratory ARREST Use medication by any route, position, wound care, and other measures to relive pain and suffering. May use oxygen, suction and manual treatment of airway obstruction as needed for comfort.   Comments Please do not place purple band on patient      05/18/19 1953        Code Status History    Date Active Date Inactive Code Status Order ID Comments User Context   02/01/2019 1556 02/11/2019 2317 DNR [id number]  Drue Novel, NP Inpatient   01/31/2019 1515 02/01/2019 1556 Full Code [id number]  Drue Novel, NP Inpatient   01/31/2019 1513 01/31/2019 1515 DNR [id number]  Drue Novel, NP Inpatient   01/30/2019 2038 01/31/2019 1513 Full Code [id number]  Shela Leff, MD ED   12/15/2018 0351 12/17/2018 1532 Full Code [id number]  Opyd, Ilene Qua, MD Inpatient   Advance Care Planning Activity      Home/SNF/Other Home  Chief Complaint Dehydrated  Level of Care/Admitting Diagnosis ED Disposition    ED Disposition Condition [place]: [place] [[id number]]  Level of Care: Telemetry [5]  Admit to tele based on following criteria: Monitor for Ischemic changes  Covid Evaluation: Symptomatic Person Under Investigation (PUI)  Diagnosis: HCAP (healthcare-associated pneumonia) [034742]  Admitting Physician: Jani Gravel [[id number]]  Attending Physician: Jani Gravel [phone]  Estimated length of stay: past midnight tomorrow  Certification:: I certify this patient will need inpatient services for  at least 2 midnights  PT Class (Do Not Modify): Inpatient [101]  PT Acc Code (Do Not Modify): Private [1]       Medical History Past Medical History:  Diagnosis Date  . Dementia ([place])   . High cholesterol   . Hypertension   . Lung mass 12/2018    Allergies No Known Allergies  IV Location/Drains/Wounds Patient Lines/Drains/Airways Status   Active Line/Drains/Airways    Name:   Placement date:   Placement time:   Site:   Days:   Peripheral IV 05/18/19 Right Antecubital   05/18/19    1616    Antecubital   1          Labs/Imaging Results for orders placed or performed during the hospital encounter of 05/18/19 (from the past 48 hour(s))  CBC with Differential     Status: Abnormal   Collection Time: 05/18/19  4:15 PM  Result Value Ref Range   WBC 6.4 4.0 - 10.5 K/uL   RBC 5.55 4.22 - 5.81 MIL/uL   Hemoglobin 11.3 (L) 13.0 - 17.0 g/dL   HCT 35.8 (L) 39.0 - 52.0 %   MCV 64.5 (L) 80.0 - 100.0 fL   MCH 20.4 (L) 26.0 - 34.0 pg   MCHC 31.6 30.0 - 36.0 g/dL   RDW 25.3 (H) 11.5 - 15.5 %   Platelets 394 150 - 400 K/uL   nRBC 0.5 (H) 0.0 - 0.2 %   Neutrophils Relative %  69 %   Neutro Abs 4.5 1.7 - 7.7 K/uL   Lymphocytes Relative 14 %   Lymphs Abs 0.9 0.7 - 4.0 K/uL   Monocytes Relative 14 %   Monocytes Absolute 0.9 0.1 - 1.0 K/uL   Eosinophils Relative 1 %   Eosinophils Absolute 0.0 0.0 - 0.5 K/uL   Basophils Relative 1 %   Basophils Absolute 0.0 0.0 - 0.1 K/uL   Immature Granulocytes 1 %   Abs Immature Granulocytes 0.03 0.00 - 0.07 K/uL    Comment: Performed at [facility], [place] [address] [place], [place] [address]  Comprehensive metabolic panel     Status: Abnormal   Collection Time: 05/18/19  4:15 PM  Result Value Ref Range   Sodium 141 135 - 145 mmol/L   Potassium 4.3 3.5 - 5.1 mmol/L   Chloride 102 98 - 111 mmol/L   CO2 28 22 - 32 mmol/L   Glucose, Bld 132 (H) 70 - 99 mg/dL   BUN 14 8 - 23 mg/dL   Creatinine, Ser 0.71 0.61 - 1.24 mg/dL    Calcium 9.5 8.9 - 10.3 mg/dL   Total Protein 7.5 6.5 - 8.1 g/dL   Albumin 3.0 (L) 3.5 - 5.0 g/dL   AST 19 15 - 41 U/L   ALT 14 0 - 44 U/L   Alkaline Phosphatase 82 38 - 126 U/L   Total Bilirubin 0.6 0.3 - 1.2 mg/dL   GFR calc non Af Amer >60 >60 mL/min   GFR calc Af Amer >60 >60 mL/min   Anion gap 11 5 - 15    Comment: Performed at [facility], [place] [address] [place], [place] [address]  Lipase, blood     Status: None   Collection Time: 05/18/19  4:15 PM  Result Value Ref Range   Lipase 21 11 - 51 U/L    Comment: Performed at [facility], [place] [address], [place], [place] [address]  Lactic acid, plasma     Status: Abnormal   Collection Time: 05/18/19  4:15 PM  Result Value Ref Range   Lactic Acid, Venous 2.5 (HH) 0.5 - 1.9 mmol/L    Comment: CRITICAL RESULT CALLED TO, READ BACK BY AND VERIFIED WITH: Adelene Idler [id number] @ [id number] BY J SCOTTON Performed at [place] [address] [place], [place] [address]   Troponin I (High Sensitivity)     Status: None   Collection Time: 05/18/19  4:15 PM  Result Value Ref Range   Troponin I (High Sensitivity) 5 <18 ng/L    Comment: (NOTE) Elevated high sensitivity troponin I (hsTnI) values and significant  changes across serial measurements may suggest ACS but many other  chronic and acute conditions are known to elevate hsTnI results.  Refer to the "Links" section for chest pain algorithms and additional  guidance. Performed at [facility], [place] [address] [place], [place] [address]   Lactic acid, plasma     Status: None   Collection Time: 05/18/19  5:45 PM  Result Value Ref Range   Lactic Acid, Venous 1.8 0.5 - 1.9 mmol/L    Comment: Performed at [facility], [place] [address], [place], [place] [address]  Troponin I (High Sensitivity)     Status: None   Collection Time: 05/18/19  5:45 PM  Result Value Ref Range   Troponin I (High  Sensitivity) 5 <18 ng/L    Comment: (NOTE) Elevated high sensitivity troponin I (hsTnI)  values and significant  changes across serial measurements may suggest ACS but many other  chronic and acute conditions are known to elevate hsTnI results.  Refer to the "Links" section for chest pain algorithms and additional  guidance. Performed at [facility], [place] [address] [place], [place] [address]   SARS CORONAVIRUS 2 (TAT 6-24 HRS) Nasopharyngeal Nasopharyngeal Swab     Status: None   Collection Time: 05/18/19  6:19 PM   Specimen: Nasopharyngeal Swab  Result Value Ref Range   SARS Coronavirus 2 NEGATIVE NEGATIVE    Comment: (NOTE) SARS-CoV-2 target nucleic acids are NOT DETECTED. The SARS-CoV-2 RNA is generally detectable in upper and lower respiratory specimens during the acute phase of infection. Negative results do not preclude SARS-CoV-2 infection, do not rule out co-infections with other pathogens, and should not be used as the sole basis for treatment or other patient management decisions. Negative results must be combined with clinical observations, patient history, and epidemiological information. The expected result is Negative. Fact Sheet for Patients: SugarRoll.be Fact Sheet for Healthcare Providers: https://www.woods-mathews.com/ This test is not yet approved or cleared by the [place] FDA and  has been authorized for detection and/or diagnosis of SARS-CoV-2 by FDA under an Emergency Use Authorization (EUA). This EUA will remain  in effect (meaning this test can be used) for the duration of the COVID-19 declaration under Section 56 4(b)(1) of the Act, 21 U.S.C. section 360bbb-3(b)(1), unless the authorization is terminated or revoked sooner. Performed at [facility], [place] [address], [place], [place] [address]    Dg Chest Port 1 View  Result Date: 05/18/2019 CLINICAL DATA:  Failure to thrive EXAM:  PORTABLE CHEST 1 VIEW COMPARISON:  Portable exam 1522 hours compared to 05/08/2019 FINDINGS: Normal heart size, mediastinal contours, and pulmonary vascularity. Atherosclerotic calcification aorta. Emphysematous changes and bronchitic changes consistent with COPD. Persistent opacity at RIGHT lung base question pneumonia. Radiographic follow-up until resolution recommended to exclude underlying abnormalities including tumor. Minimal atelectasis or infiltrate at LEFT base. No pleural effusion or pneumothorax. Bones demineralized. IMPRESSION: COPD changes with with bibasilar atelectasis versus infiltrate much greater on RIGHT; radiographic follow-up until resolution recommended to exclude underlying abnormalities including tumor. Aortic Atherosclerosis (ICD10-I70.0). Electronically Signed   By: Lavonia Dana M.D.   On: 05/18/2019 15:46    Pending Labs Unresulted Labs (From admission, onward)    Start     Ordered   05/19/19 0500  HIV Antibody (routine testing w rflx)  (HIV Antibody (Routine testing w reflex) panel)  Tomorrow morning,   R     05/18/19 1953   05/18/19 2219  Strep pneumoniae urinary antigen  Once,   STAT     05/18/19 2218   05/18/19 2219  Legionella Pneumophila Serogp 1 Ur Ag  Once,   STAT     05/18/19 2218   05/18/19 1511  Urinalysis, Routine w reflex microscopic  ONCE - STAT,   STAT     05/18/19 1510   05/18/19 1511  Culture, blood (routine x 2)  BLOOD CULTURE X 2,   STAT     05/18/19 1510          Vitals/Pain Today's Vitals   05/18/19 2349 05/19/19 0030 05/19/19 0100 05/19/19 0130  BP: (!) 99/58 (!) 95/52 101/62 (!) 100/48  Pulse: (!) 111 (!) 113 (!) 107 (!) 109  Resp: 18 18 18 16   Temp:      TempSrc:      SpO2: 92% 92% 93% 91%  Weight:      Height:        Isolation Precautions No active isolations  Medications Medications  feeding supplement (PRO-STAT SUGAR FREE 64) liquid 30 mL (has no administration in time range)  vancomycin (VANCOCIN) IVPB 750 mg/150 ml premix  (has no administration in time range)  ceFEPIme (MAXIPIME) 2 g in sodium chloride 0.9 % 100 mL IVPB (has no administration in time range)  rivaroxaban (XARELTO) tablet 20 mg (20 mg Oral Given 05/19/19 0111)  sodium chloride 0.9 % bolus 1,000 mL (0 mLs Intravenous Stopped 05/18/19 2058)  lidocaine-EPINEPHrine (XYLOCAINE W/EPI) 2 %-1:200000 (PF) injection 10 mL (10 mLs Intradermal Given by Other 05/18/19 1543)  vancomycin (VANCOCIN) 1,250 mg in sodium chloride 0.9 % 250 mL IVPB (0 mg Intravenous Stopped 05/18/19 2058)  piperacillin-tazobactam (ZOSYN) IVPB 3.375 g (0 g Intravenous Stopped 05/18/19 1921)    Mobility walks with device

## 2019-05-19 NOTE — ED Notes (Signed)
Brief changed, pts belongings placed in belongings bag.

## 2019-05-19 NOTE — Progress Notes (Signed)
   05/19/19 2129  Vitals  Temp 99.1 F (37.3 C)  Temp Source Oral  BP 111/72  MAP (mmHg) 84  BP Location Right Arm  BP Method Automatic  Patient Position (if appropriate) Lying  Pulse Rate (!) 118  Resp (!) 24  Level of Consciousness  Level of Consciousness Alert  Oxygen Therapy  SpO2 95 %  O2 Device Room Air  Pain Assessment  Pain Scale PAINAD  PAINAD (Pain Assessment in Advanced Dementia)  Breathing 1  Negative Vocalization 0  Facial Expression 2  Body Language 0  Consolability 0  PAINAD Score 3  PCA/Epidural/Spinal Assessment  Respiratory Pattern Regular;Shallow;Tachypnea  MEWS Score  MEWS RR 1  MEWS Pulse 2  MEWS Systolic 0  MEWS LOC 0  MEWS Temp 0  MEWS Score 3  MEWS Score Color Yellow  MEWS Assessment  Is this an acute change? Yes  MEWS guidelines implemented *See Row Information* Yellow  pt asymptomatic, on-call provider made aware, will continue to monitor.

## 2019-05-19 NOTE — Progress Notes (Signed)
Pharmacy Antibiotic Note  Richard [name] is a 78 y.o. male admitted on 05/18/2019 with pneumonia.  Pharmacy has been consulted for vancomycin + cefepime dosing.  Pt is 78 year old male with PMH significant for NSCLC, hx VTE/afib on rivaroxaban PTA. Pt presenting with hypoxia and broad spectrum antibiotics initiated for PNA.  Today, 05/19/19  WBC WNL  SCr 0.7, WNL. CrCl ~ 65 mL/min  Lactate 1.8  Afebrile  TBW = 61 kg. TBW < IBW  Currently prescribed cefepime 2 g IV q8h and vancomycin 750 mg IV q12h.  Plan:  Continue cefepime 2 g IV q8h  Change vancomycin dose to 1000 mg IV q24h  Goal vancomycin AUC 400-550  SCr daily  Recommend checking MRSA PCR. If only suspected source of infection is respiratory and MRSA PCR results as negative, recommend discontinuing vancomycin.  Follow culture data  Height: 6\' 2"  (188 cm) Weight: 134 lb 4.2 oz (60.9 kg) IBW/kg (Calculated) : 82.2  Temp (24hrs), Avg:97.9 F (36.6 C), Min:97.4 F (36.3 C), Max:98.4 F (36.9 C)  Recent Labs  Lab 05/18/19 1615 05/18/19 1745  WBC 6.4  --   CREATININE 0.71  --   LATICACIDVEN 2.5* 1.8    Estimated Creatinine Clearance: 67.7 mL/min (by C-G formula based on SCr of 0.71 mg/dL).    No Known Allergies  Antimicrobials this admission: cefepime 12/3 >>  vancomycin 12/3 >>   Dose adjustments this admission:  Microbiology results: 12/2 BCx: ngtd 12/2 SARS-2: Negative  Thank you for allowing pharmacy to be a part of this patient's care.  Lenis Noon, PharmD 05/19/2019 11:56 AM

## 2019-05-20 DIAGNOSIS — R630 Anorexia: Secondary | ICD-10-CM

## 2019-05-20 DIAGNOSIS — E43 Unspecified severe protein-calorie malnutrition: Secondary | ICD-10-CM

## 2019-05-20 LAB — LEGIONELLA PNEUMOPHILA SEROGP 1 UR AG: L. pneumophila Serogp 1 Ur Ag: NEGATIVE

## 2019-05-20 LAB — CREATININE, SERUM
Creatinine, Ser: 0.58 mg/dL — ABNORMAL LOW (ref 0.61–1.24)
GFR calc Af Amer: >60 mL/min (ref >60)
GFR calc non Af Amer: >60 mL/min (ref >60)

## 2019-05-20 LAB — MRSA PCR SCREENING: MRSA by PCR: NEGATIVE

## 2019-05-20 MED ORDER — DRONABINOL 2.5 MG PO CAPS
2.5000 mg | ORAL_CAPSULE | Freq: Two times a day (BID) | ORAL | Status: DC
  Administered 2019-05-20 – 2019-05-25 (×6): 2.5 mg via ORAL
  Filled 2019-05-20 (×11): qty 1

## 2019-05-20 MED ORDER — MEGESTROL ACETATE 400 MG/10ML PO SUSP: 400.0000 mg | Freq: Two times a day (BID) | ORAL | Status: DC

## 2019-05-20 NOTE — Progress Notes (Signed)
Pt has not been seen by PT since admission. Will inform PT to assess ambulatory pulse ox during PT assessment.

## 2019-05-20 NOTE — Progress Notes (Signed)
PHARMACY NOTE:  Pharmacy has been assisting with dosing of Cefepime for HCAP. Dosage remains stable at 2g IV q8h and need for further dosage adjustment appears unlikely at present. Vancomycin has been discontinued.   Will sign off at this time.  Please reconsult if a change in clinical status warrants re-evaluation of dosage.   Lindell Spar, PharmD, BCPS Clinical Pharmacist 05/20/2019 11:19 AM

## 2019-05-20 NOTE — Progress Notes (Signed)
RN attempted to give afternoon medications. Pt cussing and insisting that pill be put in iced tea. RN informed this type of medication cannot be dissolved this way. Pt continues to cuss and get agitated. Will continue to monitor pt.

## 2019-05-20 NOTE — Progress Notes (Addendum)
PROGRESS NOTE    Guage Efferson   UKG:[id number]  DOB: 1941-01-27  DOA: 05/18/2019 PCP: [name], NP   Brief Narrative:  Richard [name] is a 78 year old male with dementia, A. fib with RVR, hypertension, hyperlipidemia, history of right common femoral DVT on 01/31/2019, stage III non-small cell lung cancer who presents from home with poor oral intake and hypoxia on ambulation when checked by his home health RN.  Chest x-ray in [facility]:EGBT changes with with bibasilar atelectasis versus infiltrate much greater on RIGHT Lactic acid 2.5 Covid negative. Tachycardic - blood pressure as low as 95/58  He was last admitted to the hospital from 11/22-11/24 with suspected sepsis, the source of which was not fully determined.  He was given fosfomycin for suspected UTI and also discharged with 5 days of Augmentin.  Subjective: He remains confused today.  He is not allowing portions of the exam.  No new complaints    Assessment & Plan:   Principal Problem:   HCAP (healthcare-associated pneumonia) -Right lower lobe infiltrate with lactic acidosis, tachycardia, hypotension-this may be a postobstructive pneumonia as he does have a right middle lobe obstructing mass -Lactic acidosis has improved after IV fluids however he continued to be tachycardic -this has now resolved -Strep pneumonia antigen and HIV negative -He was started on vancomycin and Cefepime to cover for HCAP-  MRSA PCR is found to be negative and therefore vancomycin was discontinued -Continue cefepime-his sepsis has improved  Active Problems: Non-small cell cancer-diagnosed in July 2020 - Most recent CT was in August 2020 and revealed a spiculated perihilar mass with obstruction of the right middle lobe and pretracheal enlarged lymph nodes.  He also had a pulmonary nodule in the left lower lobe and a moderate right-sided pleural effusion. -- He has received 8 cycles of chemotherapy Dr. Julien Nordmann  --  follow-up CT is planned for  12/10, per his daughter  Protein-calorie malnutrition, severe with anorexia -Dietitian consult requested-continue supplements- the patient does not appear to have and appetite which may be related to underlying cancer or recent chemo - His daughter whom he lives with, [name], would like him to have a feeding tube if he does not start eating/ drinking - I will start Marinol- follow oral intake    A-fib - cont Xarelto-I have noted that he is not on any rate controlling agent -at this time he remains in NSR  Dementia - follow for behavioral disturbances- might need sitter or medications as he did so on last admission   h/o PE in August 2020 - cont Xarelto    Time spent in minutes: 35 DVT prophylaxis: Xarelto Code Status: DNR Family Communication: daughter Yarden Hillis Disposition Plan: home when oral intake addressed- the daughter does not want palliative care and she does not want her father going to a SNF- she will eventually take him home Consultants:   none Procedures:   none Antimicrobials:  Anti-infectives (From admission, onward)   Start     Dose/Rate Route Frequency Ordered Stop   05/19/19 1800  vancomycin (VANCOCIN) IVPB 1000 mg/200 mL premix  Status:  Discontinued     1,000 mg 200 mL/hr over 60 Minutes Intravenous Every 24 hours 05/19/19 1141 05/20/19 1124   05/19/19 0800  vancomycin (VANCOCIN) IVPB 750 mg/150 ml premix  Status:  Discontinued     750 mg 150 mL/hr over 60 Minutes Intravenous Every 12 hours 05/18/19 2025 05/19/19 1139   05/19/19 0200  ceFEPIme (MAXIPIME) 2 g in sodium chloride 0.9 %  100 mL IVPB     2 g 200 mL/hr over 30 Minutes Intravenous Every 8 hours 05/18/19 2025     05/18/19 1815  vancomycin (VANCOCIN) 1,250 mg in sodium chloride 0.9 % 250 mL IVPB     1,250 mg 166.7 mL/hr over 90 Minutes Intravenous  Once 05/18/19 1806 05/18/19 2058   05/18/19 1815  piperacillin-tazobactam (ZOSYN) IVPB 3.375 g     3.375 g 100 mL/hr over 30 Minutes  Intravenous  Once 05/18/19 1806 05/18/19 1921       Objective: Vitals:   05/20/19 0509 05/20/19 0529 05/20/19 0921 05/20/19 1242  BP: 107/65 (!) 96/59 118/73 101/68  Pulse: (!) 106 (!) 108 98 99  Resp: (!) 22 18 20 20   Temp:  98.2 F (36.8 C) 98.4 F (36.9 C) 98 F (36.7 C)  TempSrc:  Oral Oral Axillary  SpO2: 95% 93% 93% 98%  Weight:      Height:        Intake/Output Summary (Last 24 hours) at 05/20/2019 1415 Last data filed at 05/20/2019 1230 Gross per 24 hour  Intake 1107 ml  Output 575 ml  Net 532 ml   Filed Weights   05/18/19 1450 05/19/19 0325  Weight: 63.6 kg 60.9 kg    Examination: General exam: Appears comfortable  HEENT: PERRLA, oral mucosa moist, no sclera icterus or thrush Respiratory system: he will not allow me to listen to his lungs today Cardiovascular system: S1 & S2 heard,  No murmurs  Gastrointestinal system: Abdomen soft, non-tender, nondistended. Normal bowel sounds   Central nervous system: Alert and oriented to person only. No focal neurological deficits. Extremities: No cyanosis, clubbing or edema Skin: No rashes or ulcers Psychiatry:  Mood & affect appropriate.   Data Reviewed: I have personally reviewed following labs and imaging studies  CBC: Recent Labs  Lab 05/18/19 1615  WBC 6.4  NEUTROABS 4.5  HGB 11.3*  HCT 35.8*  MCV 64.5*  PLT 010   Basic Metabolic Panel: Recent Labs  Lab 05/18/19 1615 05/20/19 0400  NA 141  --   K 4.3  --   CL 102  --   CO2 28  --   GLUCOSE 132*  --   BUN 14  --   CREATININE 0.71 0.58*  CALCIUM 9.5  --    GFR: Estimated Creatinine Clearance: 67.7 mL/min (A) (by C-G formula based on SCr of 0.58 mg/dL (L)). Liver Function Tests: Recent Labs  Lab 05/18/19 1615  AST 19  ALT 14  ALKPHOS 82  BILITOT 0.6  PROT 7.5  ALBUMIN 3.0*   Recent Labs  Lab 05/18/19 1615  LIPASE 21   No results for input(s): AMMONIA in the last 168 hours. Coagulation Profile: No results for input(s): INR,  PROTIME in the last 168 hours. Cardiac Enzymes: No results for input(s): CKTOTAL, CKMB, CKMBINDEX, TROPONINI in the last 168 hours. BNP (last 3 results) No results for input(s): PROBNP in the last 8760 hours. HbA1C: No results for input(s): HGBA1C in the last 72 hours. CBG: No results for input(s): GLUCAP in the last 168 hours. Lipid Profile: No results for input(s): CHOL, HDL, LDLCALC, TRIG, CHOLHDL, LDLDIRECT in the last 72 hours. Thyroid Function Tests: No results for input(s): TSH, T4TOTAL, FREET4, T3FREE, THYROIDAB in the last 72 hours. Anemia Panel: No results for input(s): VITAMINB12, FOLATE, FERRITIN, TIBC, IRON, RETICCTPCT in the last 72 hours. Urine analysis:    Component Value Date/Time   COLORURINE YELLOW 05/19/2019 [place] 05/19/2019 1913  LABSPEC 1.020 05/19/2019 1913   PHURINE 8.0 05/19/2019 1913   GLUCOSEU NEGATIVE 05/19/2019 1913   HGBUR NEGATIVE 05/19/2019 [place] 05/19/2019 [place] 05/19/2019 [place] NEGATIVE 05/19/2019 1913   NITRITE NEGATIVE 05/19/2019 1913   LEUKOCYTESUR NEGATIVE 05/19/2019 1913   Sepsis Labs: @LABRCNTIP (procalcitonin:4,lacticidven:4) ) Recent Results (from the past 240 hour(s))  Culture, blood (routine x 2)     Status: None (Preliminary result)   Collection Time: 05/18/19  4:15 PM   Specimen: Right Antecubital; Blood  Result Value Ref Range Status   Specimen Description   Final    RIGHT ANTECUBITAL Performed at [facility], [place] [address] [place], [place] [address]    Special Requests   Final    BOTTLES DRAWN AEROBIC AND ANAEROBIC Blood Culture adequate volume Performed at [facility] [address], [place], [place] [address]    Culture   Final    NO GROWTH 2 DAYS Performed at [facility] [address], [place], [place] [address]    Report Status PENDING  Incomplete  SARS CORONAVIRUS 2 (TAT 6-24 HRS)  Nasopharyngeal Nasopharyngeal Swab     Status: None   Collection Time: 05/18/19  6:19 PM   Specimen: Nasopharyngeal Swab  Result Value Ref Range Status   SARS Coronavirus 2 NEGATIVE NEGATIVE Final    Comment: (NOTE) SARS-CoV-2 target nucleic acids are NOT DETECTED. The SARS-CoV-2 RNA is generally detectable in upper and lower respiratory specimens during the acute phase of infection. Negative results do not preclude SARS-CoV-2 infection, do not rule out co-infections with other pathogens, and should not be used as the sole basis for treatment or other patient management decisions. Negative results must be combined with clinical observations, patient history, and epidemiological information. The expected result is Negative. Fact Sheet for Patients: SugarRoll.be Fact Sheet for Healthcare Providers: https://www.woods-mathews.com/ This test is not yet approved or cleared by the [place] FDA and  has been authorized for detection and/or diagnosis of SARS-CoV-2 by FDA under an Emergency Use Authorization (EUA). This EUA will remain  in effect (meaning this test can be used) for the duration of the COVID-19 declaration under Section 56 4(b)(1) of the Act, 21 U.S.C. section 360bbb-3(b)(1), unless the authorization is terminated or revoked sooner. Performed at [facility], [place] [address] [place], [place] [address]   Culture, blood (routine x 2)     Status: None (Preliminary result)   Collection Time: 05/19/19  4:03 AM   Specimen: BLOOD LEFT HAND  Result Value Ref Range Status   Specimen Description   Final    BLOOD LEFT HAND Performed at [facility] [address] [place], [place] [address]    Special Requests   Final    BOTTLES DRAWN AEROBIC ONLY Blood Culture results may not be optimal due to an inadequate volume of blood received in culture bottles Performed at [facility] [address] [place], [place] [address]    Culture   Final    NO GROWTH 1 DAY Performed at [facility], [place] [address] [place], [place] [address]    Report Status PENDING  Incomplete  MRSA PCR Screening     Status: None   Collection Time: 05/20/19  7:51 AM   Specimen: Nasal Mucosa; Nasopharyngeal  Result Value Ref Range Status   MRSA by PCR NEGATIVE NEGATIVE Final    Comment:  The GeneXpert MRSA Assay (FDA approved for NASAL specimens only), is one component of a comprehensive MRSA colonization surveillance program. It is not intended to diagnose MRSA infection nor to guide or monitor treatment for MRSA infections. Performed at [facility], [place] [address] [place], [place] [address]          Radiology Studies: Dg Chest Port 1 View  Result Date: 05/18/2019 CLINICAL DATA:  Failure to thrive EXAM: PORTABLE CHEST 1 VIEW COMPARISON:  Portable exam 1522 hours compared to 05/08/2019 FINDINGS: Normal heart size, mediastinal contours, and pulmonary vascularity. Atherosclerotic calcification aorta. Emphysematous changes and bronchitic changes consistent with COPD. Persistent opacity at RIGHT lung base question pneumonia. Radiographic follow-up until resolution recommended to exclude underlying abnormalities including tumor. Minimal atelectasis or infiltrate at LEFT base. No pleural effusion or pneumothorax. Bones demineralized. IMPRESSION: COPD changes with with bibasilar atelectasis versus infiltrate much greater on RIGHT; radiographic follow-up until resolution recommended to exclude underlying abnormalities including tumor. Aortic Atherosclerosis (ICD10-I70.0). Electronically Signed   By: Lavonia Dana M.D.   On: 05/18/2019 15:46      Scheduled Meds: . feeding supplement (ENSURE ENLIVE)  237 mL Oral BID BM  . feeding supplement (PRO-STAT SUGAR FREE 64)  30 mL Oral BID  . rivaroxaban  20 mg Oral Q supper  . sodium chloride flush  10-40 mL Intracatheter Q12H    Continuous Infusions: . ceFEPime (MAXIPIME) IV 2 g (05/20/19 0859)     LOS: 2 days      Debbe Odea, MD [facility] Pager: [email or web address] Password [email or web address] 05/20/2019, 2:15 PM

## 2019-05-20 NOTE — Progress Notes (Signed)
PT Cancellation Note  Patient Details Name: Richard [name] MRN: [id number] DOB: Nov 01, 1940   Cancelled Treatment:    Reason Eval/Treat Not Completed: Other (comment) Pt declined mobility.  NT and PT attempted to assist pt with bringing legs over bed however pt became extremely agitated so ceased attempt to perform mobility today.  Pt stated, "You better get the         outta my room before I mess you up."  Therapist and NT left the room to de-escalate the situation.   Rossie Bretado,KATHrine E 05/20/2019, 3:06 PM Carmelia Bake, PT, DPT Acute Rehabilitation Services Office: [phone] Pager: [phone]

## 2019-05-21 DIAGNOSIS — L8990 Pressure ulcer of unspecified site, unspecified stage: Secondary | ICD-10-CM | POA: Insufficient documentation

## 2019-05-21 NOTE — Progress Notes (Signed)
Physical Therapy Treatment Patient Details Name: Richard [name] MRN: [id number] DOB: 11-09-1940 Today's Date: 05/21/2019    History of Present Illness Richard [name] is a 78 year old male with dementia, A. fib with RVR, hypertension, hyperlipidemia, history of right common femoral DVT on 01/31/2019, stage III non-small cell lung cancer who presents from home with poor oral intake and hypoxia on ambulation when checked by his home health RN. Admitted with HCAP.    PT Comments    Pt responded better with daughter present, although still agitated about getting up.  He required MIN A to get to EOB and MIN/guard to take a few steps to recliner.  Recommend HHPT and home with 24 hour A as pt had before admission.   Follow Up Recommendations  Supervision/Assistance - 24 hour;Home health PT     Equipment Recommendations  None recommended by PT    Recommendations for Other Services       Precautions / Restrictions Precautions Precautions: Fall Restrictions Weight Bearing Restrictions: No    Mobility  Bed Mobility Overal bed mobility: Needs Assistance Bed Mobility: Supine to Sit     Supine to sit: Min assist Sit to supine: Min assist   General bed mobility comments: MIN A to get to EOB with daughter A.  Transfers Overall transfer level: Needs assistance   Transfers: Sit to/from Stand;Stand Pivot Transfers Sit to Stand: Min guard Stand pivot transfers: Min guard       General transfer comment: Refused use of RW, but stood and took a few steps to recliner with close min/guard  Ambulation/Gait   Gait Distance (Feet): 2 Feet Assistive device: None       General Gait Details: SPT bed > recliner with MIN/guard   Stairs             Wheelchair Mobility    Modified Rankin (Stroke Patients Only)       Balance             Standing balance-Leahy Scale: Fair                              Cognition Arousal/Alertness: Awake/alert Behavior During  Therapy: Agitated Overall Cognitive Status: History of cognitive impairments - at baseline                                 General Comments: Pt a bit agitated at times with daughter, but in the end agreeable to get up to recliner      Exercises      General Comments        Pertinent Vitals/Pain Pain Assessment: No/denies pain    Home Living Family/patient expects to be discharged to:: Private residence Living Arrangements: Children Available Help at Discharge: Family Type of Home: House Home Access: Stairs to enter   Home Layout: Two level;One level Home Equipment: Walker - 2 wheels Additional Comments: Information gained from previous admission.    Prior Function Level of Independence: Needs assistance  Gait / Transfers Assistance Needed: Daughter assists him as needed. ADL's / Homemaking Assistance Needed: no family present to provide PLOF data     PT Goals (current goals can now be found in the care plan section) Acute Rehab PT Goals Patient Stated Goal: none stated PT Goal Formulation: With patient Time For Goal Achievement: 06/04/19 Potential to Achieve Goals: Fair Progress towards PT goals: Progressing toward goals  Frequency    Min 3X/week      PT Plan Current plan remains appropriate    Co-evaluation              AM-PAC PT "6 Clicks" Mobility   Outcome Measure  Help needed turning from your back to your side while in a flat bed without using bedrails?: A Little Help needed moving from lying on your back to sitting on the side of a flat bed without using bedrails?: A Little Help needed moving to and from a bed to a chair (including a wheelchair)?: A Little Help needed standing up from a chair using your arms (e.g., wheelchair or bedside chair)?: A Little Help needed to walk in hospital room?: A Little Help needed climbing 3-5 steps with a railing? : A Little 6 Click Score: 18    End of Session   Activity Tolerance: Patient  tolerated treatment well;Treatment limited secondary to agitation Patient left: in chair;with call bell/phone within reach;with chair alarm set;with family/visitor present Nurse Communication: Mobility status PT Visit Diagnosis: Muscle weakness (generalized) (M62.81);Other abnormalities of gait and mobility (R26.89)     Time: 0813-8871 PT Time Calculation (min) (ACUTE ONLY): 9 min  Charges:  $Therapeutic Activity: 8-22 mins                     Richard [name], [place] Pager [phone] 05/21/2019    Richard [name] 05/21/2019, 12:20 PM

## 2019-05-21 NOTE — Evaluation (Signed)
Physical Therapy Evaluation Patient Details Name: Richard [name] MRN: [id number] DOB: Jun 24, 1940 Today's Date: 05/21/2019   History of Present Illness  Richard [name] is a 78 year old male with dementia, A. fib with RVR, hypertension, hyperlipidemia, history of right common femoral DVT on 01/31/2019, stage III non-small cell lung cancer who presents from home with poor oral intake and hypoxia on ambulation when checked by his home health RN. Admitted with HCAP.  Clinical Impression  Pt admitted with above diagnosis.  Pt currently with functional limitations due to the deficits listed below (see PT Problem List). Pt will benefit from skilled PT to increase their independence and safety with mobility to allow discharge to the venue listed below.  Pt with limited participation, but did sit up in bed with MIN A.  Spoke to daughter on phone after eval. She was waiting on the bus to come to the hospital.  PT will come back and see pt again with daughter present to make sure they can handle pt at his current level.     Follow Up Recommendations Supervision/Assistance - 24 hour;Home health PT    Equipment Recommendations  Rolling walker with 5" wheels;Other (comment)(if he doesn't already have one)    Recommendations for Other Services       Precautions / Restrictions Precautions Precautions: Fall Restrictions Weight Bearing Restrictions: No      Mobility  Bed Mobility Overal bed mobility: Needs Assistance Bed Mobility: Supine to Sit     Supine to sit: Min assist Sit to supine: Min assist   General bed mobility comments: Pt refused to get up OOB, but did long sit in bed, but needed MIN A to sit up and MIN A to safely return supine.  Transfers                 General transfer comment: refused  Ambulation/Gait                Stairs            Wheelchair Mobility    Modified Rankin (Stroke Patients Only)       Balance                                             Pertinent Vitals/Pain Pain Assessment: No/denies pain    Home Living Family/patient expects to be discharged to:: Private residence Living Arrangements: Children Available Help at Discharge: Family Type of Home: House Home Access: Stairs to enter   Technical brewer of Steps: 2-3 Home Layout: Two level Home Equipment: None Additional Comments: Information gained from previous admission.    Prior Function Level of Independence: Needs assistance   Gait / Transfers Assistance Needed: pt states he doesn't use a RW. (unsure of reliability)  ADL's / Homemaking Assistance Needed: no family present to provide PLOF data        Hand Dominance        Extremity/Trunk Assessment   Upper Extremity Assessment Upper Extremity Assessment: Generalized weakness    Lower Extremity Assessment Lower Extremity Assessment: Generalized weakness       Communication   Communication: No difficulties  Cognition Arousal/Alertness: Awake/alert Behavior During Therapy: Flat affect Overall Cognitive Status: History of cognitive impairments - at baseline  General Comments      Exercises     Assessment/Plan    PT Assessment Patient needs continued PT services  PT Problem List Decreased strength;Decreased mobility;Decreased activity tolerance;Decreased balance       PT Treatment Interventions DME instruction;Gait training;Therapeutic exercise;Therapeutic activities;Patient/family education;Balance training;Functional mobility training    PT Goals (Current goals can be found in the Care Plan section)  Acute Rehab PT Goals Patient Stated Goal: none stated PT Goal Formulation: With patient Time For Goal Achievement: 06/04/19 Potential to Achieve Goals: Fair    Frequency Min 3X/week   Barriers to discharge        Co-evaluation               AM-PAC PT "6 Clicks" Mobility  Outcome Measure Help  needed turning from your back to your side while in a flat bed without using bedrails?: A Little Help needed moving from lying on your back to sitting on the side of a flat bed without using bedrails?: A Little Help needed moving to and from a bed to a chair (including a wheelchair)?: A Lot Help needed standing up from a chair using your arms (e.g., wheelchair or bedside chair)?: A Lot Help needed to walk in hospital room?: Total Help needed climbing 3-5 steps with a railing? : Total 6 Click Score: 12    End of Session   Activity Tolerance: Patient limited by fatigue Patient left: in bed;with call bell/phone within reach;with bed alarm set Nurse Communication: Mobility status PT Visit Diagnosis: Muscle weakness (generalized) (M62.81);Other abnormalities of gait and mobility (R26.89)    Time: 6767-2094 PT Time Calculation (min) (ACUTE ONLY): 16 min   Charges:   PT Evaluation $PT Eval Low Complexity: 1 Low          Siah Steely L. Tamala Julian, [place] Pager [phone] 05/21/2019   [name] 05/21/2019, 11:10 AM

## 2019-05-21 NOTE — Progress Notes (Signed)
PROGRESS NOTE    Richard [name]   WNU:[id number]  DOB: 07-Feb-1941  DOA: 05/18/2019 PCP: Richard Perna, NP   Brief Narrative:  Richard [name] is a 78 year old male with dementia, A. fib with RVR, hypertension, hyperlipidemia, history of right common femoral DVT on 01/31/2019, stage III non-small cell lung cancer who presents from home with poor oral intake and hypoxia on ambulation when checked by his home health RN.  Chest x-ray in [facility]:KVQQ changes with with bibasilar atelectasis versus infiltrate much greater on RIGHT Lactic acid 2.5 Covid negative. Tachycardic - blood pressure as low as 95/58  He was last admitted to the hospital from 11/22-11/24 with suspected sepsis, the source of which was not fully determined.  He was given fosfomycin for suspected UTI and also discharged with 5 days of Augmentin.  05/21/2019: Patient seen.  Patient looks a lot better today.  Patient appetite seems to be improving.  Will order procalcitonin.  Caloric count.  Consult dietary team.  Further management depend on hospital course.  Subjective: No significant history from patient. Patient denies fever, shortness of breath or productive cough.  Assessment & Plan:   Principal Problem:   HCAP (healthcare-associated pneumonia) -Right lower lobe infiltrate with lactic acidosis, tachycardia, hypotension-this may be a postobstructive pneumonia as he does have a right middle lobe obstructing mass -Lactic acidosis has improved after IV fluids however he continued to be tachycardic -this has now resolved -Strep pneumonia antigen and HIV negative -He was started on vancomycin and Cefepime to cover for HCAP-  MRSA PCR is found to be negative and therefore vancomycin was discontinued -Continue cefepime-his sepsis has improved 05/21/2019: Check procalcitonin.  Continue antibiotics for now.  Active Problems: Non-small cell cancer-diagnosed in July 2020 - Most recent CT was in August 2020 and revealed a  spiculated perihilar mass with obstruction of the right middle lobe and pretracheal enlarged lymph nodes.  He also had a pulmonary nodule in the left lower lobe and a moderate right-sided pleural effusion. -- He has received 8 cycles of chemotherapy Dr. Julien [name]  --  follow-up CT is planned for 12/10, per his daughter  Protein-calorie malnutrition, severe with anorexia -Dietitian consult requested-continue supplements- the patient does not appear to have and appetite which may be related to underlying cancer or recent chemo - His daughter whom he lives with, Richard [name], would like him to have a feeding tube if he does not start eating/ drinking - I will start Marinol- follow oral intake 05/21/2019: Caloric count.  Dietary consult.    A-fib - cont Xarelto-I have noted that he is not on any rate controlling agent -at this time he remains in NSR  Dementia - follow for behavioral disturbances- might need sitter or medications as he did so on last admission 05/21/2019: No behavioral problems for now.   h/o PE in August 2020 - cont Xarelto    Time spent in minutes: 25 DVT prophylaxis: Xarelto Code Status: DNR Family Communication:  Disposition Plan: This will depend on hospital course.  As per prior documentation, patient's daughter does not want palliative care team input nor skilled nursing facility placement. Consultants:   none Procedures:   none Antimicrobials:  Anti-infectives (From admission, onward)   Start     Dose/Rate Route Frequency Ordered Stop   05/19/19 1800  vancomycin (VANCOCIN) IVPB 1000 mg/200 mL premix  Status:  Discontinued     1,000 mg 200 mL/hr over 60 Minutes Intravenous Every 24 hours 05/19/19 1141 05/20/19 1124   05/19/19 0800  vancomycin (VANCOCIN) IVPB 750 mg/150 ml premix  Status:  Discontinued     750 mg 150 mL/hr over 60 Minutes Intravenous Every 12 hours 05/18/19 2025 05/19/19 1139   05/19/19 0200  ceFEPIme (MAXIPIME) 2 g in sodium chloride 0.9 %  100 mL IVPB     2 g 200 mL/hr over 30 Minutes Intravenous Every 8 hours 05/18/19 2025     05/18/19 1815  vancomycin (VANCOCIN) 1,250 mg in sodium chloride 0.9 % 250 mL IVPB     1,250 mg 166.7 mL/hr over 90 Minutes Intravenous  Once 05/18/19 1806 05/18/19 2058   05/18/19 1815  piperacillin-tazobactam (ZOSYN) IVPB 3.375 g     3.375 g 100 mL/hr over 30 Minutes Intravenous  Once 05/18/19 1806 05/18/19 1921       Objective: Vitals:   05/20/19 1242 05/20/19 1730 05/20/19 2136 05/21/19 0510  BP: 101/68 117/83 123/71 111/64  Pulse: 99 (!) 103 (!) 105 (!) 106  Resp: 20 18 19 18   Temp: 98 F (36.7 C) 98.7 F (37.1 C) 98.6 F (37 C) 98.8 F (37.1 C)  TempSrc: Axillary Oral Oral Oral  SpO2: 98% 99% 94% 94%  Weight:      Height:        Intake/Output Summary (Last 24 hours) at 05/21/2019 1411 Last data filed at 05/21/2019 0600 Gross per 24 hour  Intake 60 ml  Output 725 ml  Net -665 ml   Filed Weights   05/18/19 1450 05/19/19 0325  Weight: 63.6 kg 60.9 kg    Examination: General exam: Appears comfortable  HEENT: P mild pallor, no jaundice. Respiratory system: Clear to auscultation. Cardiovascular system: S1 & S2 heard  Gastrointestinal system: Abdomen soft, non-tender, nondistended. Normal bowel sounds   Central nervous system:  Awake and alert.  Patient moves all extremities.   Extremities: No leg edema.  Data Reviewed: I have personally reviewed following labs and imaging studies  CBC: Recent Labs  Lab 05/18/19 1615  WBC 6.4  NEUTROABS 4.5  HGB 11.3*  HCT 35.8*  MCV 64.5*  PLT 169   Basic Metabolic Panel: Recent Labs  Lab 05/18/19 1615 05/20/19 0400  NA 141  --   K 4.3  --   CL 102  --   CO2 28  --   GLUCOSE 132*  --   BUN 14  --   CREATININE 0.71 0.58*  CALCIUM 9.5  --    GFR: Estimated Creatinine Clearance: 67.7 mL/min (A) (by C-G formula based on SCr of 0.58 mg/dL (L)). Liver Function Tests: Recent Labs  Lab 05/18/19 1615  AST 19  ALT 14   ALKPHOS 82  BILITOT 0.6  PROT 7.5  ALBUMIN 3.0*   Recent Labs  Lab 05/18/19 1615  LIPASE 21   No results for input(s): AMMONIA in the last 168 hours. Coagulation Profile: No results for input(s): INR, PROTIME in the last 168 hours. Cardiac Enzymes: No results for input(s): CKTOTAL, CKMB, CKMBINDEX, TROPONINI in the last 168 hours. BNP (last 3 results) No results for input(s): PROBNP in the last 8760 hours. HbA1C: No results for input(s): HGBA1C in the last 72 hours. CBG: No results for input(s): GLUCAP in the last 168 hours. Lipid Profile: No results for input(s): CHOL, HDL, LDLCALC, TRIG, CHOLHDL, LDLDIRECT in the last 72 hours. Thyroid Function Tests: No results for input(s): TSH, T4TOTAL, FREET4, T3FREE, THYROIDAB in the last 72 hours. Anemia Panel: No results for input(s): VITAMINB12, FOLATE, FERRITIN, TIBC, IRON, RETICCTPCT in the last 72 hours. Urine analysis:  Component Value Date/Time   COLORURINE YELLOW 05/19/2019 1913   APPEARANCEUR CLEAR 05/19/2019 1913   LABSPEC 1.020 05/19/2019 1913   PHURINE 8.0 05/19/2019 1913   GLUCOSEU NEGATIVE 05/19/2019 1913   HGBUR NEGATIVE 05/19/2019 [place] NEGATIVE 05/19/2019 1913   KETONESUR NEGATIVE 05/19/2019 1913   PROTEINUR NEGATIVE 05/19/2019 1913   NITRITE NEGATIVE 05/19/2019 1913   LEUKOCYTESUR NEGATIVE 05/19/2019 1913   Sepsis Labs: @LABRCNTIP (procalcitonin:4,lacticidven:4) ) Recent Results (from the past 240 hour(s))  Culture, blood (routine x 2)     Status: None (Preliminary result)   Collection Time: 05/18/19  4:15 PM   Specimen: BLOOD  Result Value Ref Range Status   Specimen Description   Final    BLOOD RIGHT ANTECUBITAL Performed at [facility], [place] [address], [place], [place] [address]    Special Requests   Final    BOTTLES DRAWN AEROBIC AND ANAEROBIC Blood Culture adequate volume Performed at [facility] [address], [place], [place] [address]    Culture    Final    NO GROWTH 3 DAYS Performed at [facility], [place] [address] [place], [place] [address]    Report Status PENDING  Incomplete  SARS CORONAVIRUS 2 (TAT 6-24 HRS) Nasopharyngeal Nasopharyngeal Swab     Status: None   Collection Time: 05/18/19  6:19 PM   Specimen: Nasopharyngeal Swab  Result Value Ref Range Status   SARS Coronavirus 2 NEGATIVE NEGATIVE Final    Comment: (NOTE) SARS-CoV-2 target nucleic acids are NOT DETECTED. The SARS-CoV-2 RNA is generally detectable in upper and lower respiratory specimens during the acute phase of infection. Negative results do not preclude SARS-CoV-2 infection, do not rule out co-infections with other pathogens, and should not be used as the sole basis for treatment or other patient management decisions. Negative results must be combined with clinical observations, patient history, and epidemiological information. The expected result is Negative. Fact Sheet for Patients: SugarRoll.be Fact Sheet for Healthcare Providers: https://www.woods-mathews.com/ This test is not yet approved or cleared by the [place] FDA and  has been authorized for detection and/or diagnosis of SARS-CoV-2 by FDA under an Emergency Use Authorization (EUA). This EUA will remain  in effect (meaning this test can be used) for the duration of the COVID-19 declaration under Section 56 4(b)(1) of the Act, 21 U.S.C. section 360bbb-3(b)(1), unless the authorization is terminated or revoked sooner. Performed at [facility], [place] [address] [place], [place] [address]   Culture, blood (routine x 2)     Status: None (Preliminary result)   Collection Time: 05/19/19  4:03 AM   Specimen: BLOOD LEFT HAND  Result Value Ref Range Status   Specimen Description   Final    BLOOD LEFT HAND Performed at [facility] [address], [place], [place] [address]    Special Requests   Final    BOTTLES DRAWN  AEROBIC ONLY Blood Culture results may not be optimal due to an inadequate volume of blood received in culture bottles Performed at [facility] [address], [place], [place] [address]    Culture   Final    NO GROWTH 2 DAYS Performed at [facility] [address] [place], [place] [address]    Report Status PENDING  Incomplete  MRSA PCR Screening     Status: None   Collection Time: 05/20/19  7:51 AM   Specimen: Nasal Mucosa; Nasopharyngeal  Result Value Ref Range Status  MRSA by PCR NEGATIVE NEGATIVE Final    Comment:        The GeneXpert MRSA Assay (FDA approved for NASAL specimens only), is one component of a comprehensive MRSA colonization surveillance program. It is not intended to diagnose MRSA infection nor to guide or monitor treatment for MRSA infections. Performed at [facility], [place] [address] [place], [place] [address]          Radiology Studies: No results found.    Scheduled Meds: . dronabinol  2.5 mg Oral BID AC  . feeding supplement (ENSURE ENLIVE)  237 mL Oral BID BM  . feeding supplement (PRO-STAT SUGAR FREE 64)  30 mL Oral BID  . rivaroxaban  20 mg Oral Q supper  . sodium chloride flush  10-40 mL Intracatheter Q12H   Continuous Infusions: . ceFEPime (MAXIPIME) IV 2 g (05/21/19 1025)     LOS: 3 days      Bonnell Public, MD [facility] Pager: [email or web address] Password [email or web address] 05/21/2019, 2:11 PM

## 2019-05-22 LAB — PROCALCITONIN: Procalcitonin: <0.1 ng/mL

## 2019-05-22 NOTE — Progress Notes (Signed)
Attending physician paged about request for different provider.   Daughter, [name], and patient's brother Eddie Dibbles, both requesting a different physician.     Alerted CN that a request is being made through the attending physician for a different physician.    Will continue to monitor.    SWhittemore, Therapist, sports

## 2019-05-22 NOTE — Progress Notes (Signed)
PROGRESS NOTE    Richard [name]   CZY:[id number]  DOB: [birth year]  DOA: 05/18/2019 PCP: [name], NP   Brief Narrative:  Richard [name] is a 78 year old male with dementia, A. fib with RVR, hypertension, hyperlipidemia, history of right common femoral DVT on 01/31/2019, stage III non-small cell lung cancer who presents from home with poor oral intake and hypoxia on ambulation when checked by his home health RN.  Chest x-ray in [facility]:NATF changes with with bibasilar atelectasis versus infiltrate much greater on RIGHT (these are chronic changes when compared to prior chest x-rays).  No constitutional symptoms suggestive of pneumonic process. Lactic acid 2.5 Covid negative. Tachycardic - blood pressure as low as 95/58  He was last admitted to the hospital from 11/22-11/24 with suspected sepsis, the source of which was not fully determined.  He was given fosfomycin for suspected UTI and also discharged with 5 days of Augmentin.  05/21/2019: Patient seen.  Patient looks a lot better today.  Patient appetite seems to be improving.  Will order procalcitonin.  Caloric count.  Consult dietary team.  Further management depend on hospital course.  05/22/2019: No fever, no leukocytosis, no signs of pneumonia.  Chest x-ray revealed old bibasilar changes.  Procalcitonin is less than 0.1.  I doubt pneumonia.  Will discontinue IV cefepime.  Patient continues to have poor p.o. intake despite being on dronabinol.  Discussed with patient's daughter extensively.  Communicated need to involve palliative care team.  Patient's daughter wants PEG tube placed immediately.  I have advised the patient's daughter of the need to have palliative care team perform a formal consultation so goal of care can be defined.  Please have low threshold to consult patient's oncologist, Dr. Earlie Server in the morning.  Patient may be failing to thrive.  Subjective: No significant history from patient. Patient denies fever, shortness  of breath or productive cough.  Assessment & Plan:   Principal Problem:   HCAP (healthcare-associated pneumonia)-I doubt: -Right lower lobe infiltrate with lactic acidosis, tachycardia, hypotension-this may be a postobstructive pneumonia as he does have a right middle lobe obstructing mass -Lactic acidosis has improved after IV fluids however he continued to be tachycardic -this has now resolved -Strep pneumonia antigen and HIV negative -He was started on vancomycin and Cefepime to cover for HCAP-  MRSA PCR is found to be negative and therefore vancomycin was discontinued -Continue cefepime-his sepsis has improved 05/21/2019: Check procalcitonin.  Continue antibiotics for now. 05/22/2019: Please see above.  Procalcitonin is less than 0.1.  No constitutional symptoms of pneumonia.  We discontinue IV cefepime.  Non-small cell cancer-diagnosed in July 2020 - Most recent CT was in August 2020 and revealed a spiculated perihilar mass with obstruction of the right middle lobe and pretracheal enlarged lymph nodes.  He also had a pulmonary nodule in the left lower lobe and a moderate right-sided pleural effusion. -- He has received 8 cycles of chemotherapy Dr. Julien Nordmann  --  follow-up CT is planned for 12/10, per his daughter 05/22/2019: Please consult patient's oncologist in the morning, Dr. Earlie Server.  Goal of care will need to be discussed.  Consult palliative care team.  Protein-calorie malnutrition, severe with anorexia -Dietitian consult requested-continue supplements- the patient does not appear to have and appetite which may be related to underlying cancer or recent chemo - His daughter whom he lives with, [name], would like him to have a feeding tube if he does not start eating/ drinking - I will start Marinol- follow oral intake  05/21/2019: Caloric count.  Dietary consult.    A-fib - cont Xarelto-I have noted that he is not on any rate controlling agent -at this time he remains in NSR   Dementia - follow for behavioral disturbances- might need sitter or medications as he did so on last admission 05/21/2019: No behavioral problems for now.   h/o PE in August 2020 - cont Xarelto    Time spent in minutes: 25 DVT prophylaxis: Xarelto Code Status: DNR Family Communication:  Disposition Plan: This will depend on hospital course.  As per prior documentation, patient's daughter does not want palliative care team input nor skilled nursing facility placement. Consultants:   none Procedures:   none Antimicrobials:  Anti-infectives (From admission, onward)   Start     Dose/Rate Route Frequency Ordered Stop   05/19/19 1800  vancomycin (VANCOCIN) IVPB 1000 mg/200 mL premix  Status:  Discontinued     1,000 mg 200 mL/hr over 60 Minutes Intravenous Every 24 hours 05/19/19 1141 05/20/19 1124   05/19/19 0800  vancomycin (VANCOCIN) IVPB 750 mg/150 ml premix  Status:  Discontinued     750 mg 150 mL/hr over 60 Minutes Intravenous Every 12 hours 05/18/19 2025 05/19/19 1139   05/19/19 0200  ceFEPIme (MAXIPIME) 2 g in sodium chloride 0.9 % 100 mL IVPB     2 g 200 mL/hr over 30 Minutes Intravenous Every 8 hours 05/18/19 2025     05/18/19 1815  vancomycin (VANCOCIN) 1,250 mg in sodium chloride 0.9 % 250 mL IVPB     1,250 mg 166.7 mL/hr over 90 Minutes Intravenous  Once 05/18/19 1806 05/18/19 2058   05/18/19 1815  piperacillin-tazobactam (ZOSYN) IVPB 3.375 g     3.375 g 100 mL/hr over 30 Minutes Intravenous  Once 05/18/19 1806 05/18/19 1921       Objective: Vitals:   05/21/19 2049 05/22/19 0119 05/22/19 0309 05/22/19 0512  BP:  107/61 106/62 113/69  Pulse:  (!) 111 (!) 106 (!) 108  Resp: (!) 28 20 20 20   Temp:  99 F (37.2 C) 98.7 F (37.1 C) 98.7 F (37.1 C)  TempSrc:  Oral Oral Oral  SpO2:  92% 94% 93%  Weight:      Height:        Intake/Output Summary (Last 24 hours) at 05/22/2019 1020 Last data filed at 05/22/2019 5885 Gross per 24 hour  Intake 60 ml  Output 500  ml  Net -440 ml   Filed Weights   05/18/19 1450 05/19/19 0325  Weight: 63.6 kg 60.9 kg    Examination: General exam: Appears comfortable  HEENT: P mild pallor, no jaundice. Respiratory system: Clear to auscultation. Cardiovascular system: S1 & S2 heard  Gastrointestinal system: Abdomen soft, non-tender, nondistended. Normal bowel sounds   Central nervous system:  Awake and alert.  Patient moves all extremities.   Extremities: No leg edema.  Data Reviewed: I have personally reviewed following labs and imaging studies  CBC: Recent Labs  Lab 05/18/19 1615  WBC 6.4  NEUTROABS 4.5  HGB 11.3*  HCT 35.8*  MCV 64.5*  PLT 027   Basic Metabolic Panel: Recent Labs  Lab 05/18/19 1615 05/20/19 0400  NA 141  --   K 4.3  --   CL 102  --   CO2 28  --   GLUCOSE 132*  --   BUN 14  --   CREATININE 0.71 0.58*  CALCIUM 9.5  --    GFR: Estimated Creatinine Clearance: 67.7 mL/min (A) (by C-G formula  based on SCr of 0.58 mg/dL (L)). Liver Function Tests: Recent Labs  Lab 05/18/19 1615  AST 19  ALT 14  ALKPHOS 82  BILITOT 0.6  PROT 7.5  ALBUMIN 3.0*   Recent Labs  Lab 05/18/19 1615  LIPASE 21   No results for input(s): AMMONIA in the last 168 hours. Coagulation Profile: No results for input(s): INR, PROTIME in the last 168 hours. Cardiac Enzymes: No results for input(s): CKTOTAL, CKMB, CKMBINDEX, TROPONINI in the last 168 hours. BNP (last 3 results) No results for input(s): PROBNP in the last 8760 hours. HbA1C: No results for input(s): HGBA1C in the last 72 hours. CBG: No results for input(s): GLUCAP in the last 168 hours. Lipid Profile: No results for input(s): CHOL, HDL, LDLCALC, TRIG, CHOLHDL, LDLDIRECT in the last 72 hours. Thyroid Function Tests: No results for input(s): TSH, T4TOTAL, FREET4, T3FREE, THYROIDAB in the last 72 hours. Anemia Panel: No results for input(s): VITAMINB12, FOLATE, FERRITIN, TIBC, IRON, RETICCTPCT in the last 72 hours. Urine  analysis:    Component Value Date/Time   COLORURINE YELLOW 05/19/2019 1913   APPEARANCEUR CLEAR 05/19/2019 1913   LABSPEC 1.020 05/19/2019 1913   PHURINE 8.0 05/19/2019 1913   GLUCOSEU NEGATIVE 05/19/2019 1913   HGBUR NEGATIVE 05/19/2019 [place] NEGATIVE 05/19/2019 [place] NEGATIVE 05/19/2019 1913   PROTEINUR NEGATIVE 05/19/2019 1913   NITRITE NEGATIVE 05/19/2019 1913   LEUKOCYTESUR NEGATIVE 05/19/2019 1913   Sepsis Labs: @LABRCNTIP (procalcitonin:4,lacticidven:4) ) Recent Results (from the past 240 hour(s))  Culture, blood (routine x 2)     Status: None (Preliminary result)   Collection Time: 05/18/19  4:15 PM   Specimen: BLOOD  Result Value Ref Range Status   Specimen Description   Final    BLOOD RIGHT ANTECUBITAL Performed at [facility], [place] [address], [place], [place] [address]    Special Requests   Final    BOTTLES DRAWN AEROBIC AND ANAEROBIC Blood Culture adequate volume Performed at [facility] [address], [place], [place] [address]    Culture   Final    NO GROWTH 4 DAYS Performed at [facility], [place] [address] [place], [place] [address]    Report Status PENDING  Incomplete  SARS CORONAVIRUS 2 (TAT 6-24 HRS) Nasopharyngeal Nasopharyngeal Swab     Status: None   Collection Time: 05/18/19  6:19 PM   Specimen: Nasopharyngeal Swab  Result Value Ref Range Status   SARS Coronavirus 2 NEGATIVE NEGATIVE Final    Comment: (NOTE) SARS-CoV-2 target nucleic acids are NOT DETECTED. The SARS-CoV-2 RNA is generally detectable in upper and lower respiratory specimens during the acute phase of infection. Negative results do not preclude SARS-CoV-2 infection, do not rule out co-infections with other pathogens, and should not be used as the sole basis for treatment or other patient management decisions. Negative results must be combined with clinical observations, patient history, and epidemiological information. The  expected result is Negative. Fact Sheet for Patients: SugarRoll.be Fact Sheet for Healthcare Providers: https://www.woods-mathews.com/ This test is not yet approved or cleared by the [place] FDA and  has been authorized for detection and/or diagnosis of SARS-CoV-2 by FDA under an Emergency Use Authorization (EUA). This EUA will remain  in effect (meaning this test can be used) for the duration of the COVID-19 declaration under Section 56 4(b)(1) of the Act, 21 U.S.C. section 360bbb-3(b)(1), unless the authorization is terminated or revoked sooner. Performed at [facility], [place]  [address] [place], [place] [address]   Culture, blood (routine x 2)     Status: None (Preliminary result)   Collection Time: 05/19/19  4:03 AM   Specimen: BLOOD LEFT HAND  Result Value Ref Range Status   Specimen Description   Final    BLOOD LEFT HAND Performed at [facility] [address], [place], [place] [address]    Special Requests   Final    BOTTLES DRAWN AEROBIC ONLY Blood Culture results may not be optimal due to an inadequate volume of blood received in culture bottles Performed at [facility] [address] [place], [place] [address]    Culture   Final    NO GROWTH 3 DAYS Performed at [facility], [place] [address] [place], [place] [address]    Report Status PENDING  Incomplete  MRSA PCR Screening     Status: None   Collection Time: 05/20/19  7:51 AM   Specimen: Nasal Mucosa; Nasopharyngeal  Result Value Ref Range Status   MRSA by PCR NEGATIVE NEGATIVE Final    Comment:        The GeneXpert MRSA Assay (FDA approved for NASAL specimens only), is one component of a comprehensive MRSA colonization surveillance program. It is not intended to diagnose MRSA infection nor to guide or monitor treatment for MRSA infections. Performed at [facility], [place] [address],  [place], [place] [address]          Radiology Studies: No results found.    Scheduled Meds: . dronabinol  2.5 mg Oral BID AC  . feeding supplement (ENSURE ENLIVE)  237 mL Oral BID BM  . feeding supplement (PRO-STAT SUGAR FREE 64)  30 mL Oral BID  . rivaroxaban  20 mg Oral Q supper  . sodium chloride flush  10-40 mL Intracatheter Q12H   Continuous Infusions: . ceFEPime (MAXIPIME) IV 2 g (05/22/19 0927)     LOS: 4 days      Bonnell Public, MD [facility] Pager: [email or web address] Password [email or web address] 05/22/2019, 10:20 AM

## 2019-05-22 NOTE — Progress Notes (Signed)
Attempted x2 to reach daughter, Richard [name] today.   No answer and did not leave voice message secondary to receiver not stating name on voicemail.       SWhittemore, Therapist, sports

## 2019-05-23 ENCOUNTER — Inpatient Hospital Stay (HOSPITAL_COMMUNITY): Payer: Medicare HMO

## 2019-05-23 DIAGNOSIS — F0280 Dementia in other diseases classified elsewhere without behavioral disturbance: Secondary | ICD-10-CM

## 2019-05-23 DIAGNOSIS — R630 Anorexia: Secondary | ICD-10-CM

## 2019-05-23 DIAGNOSIS — G309 Alzheimer's disease, unspecified: Secondary | ICD-10-CM

## 2019-05-23 DIAGNOSIS — C3491 Malignant neoplasm of unspecified part of right bronchus or lung: Secondary | ICD-10-CM

## 2019-05-23 LAB — CULTURE, BLOOD (ROUTINE X 2)
Culture: NO GROWTH
Special Requests: ADEQUATE

## 2019-05-23 MED ORDER — RIVAROXABAN 20 MG PO TABS: 20.0000 mg | ORAL_TABLET | Freq: Every day | ORAL | Status: DC

## 2019-05-23 NOTE — Progress Notes (Signed)
  Request received for placement of gastrostomy tube.  Awaiting Palliative care consult to discuss goals of care before proceeding.  If family continues to desire placement of gastrostomy tube, Xarelto will need to be held x 24 hours prior to procedure.  Maleeah Crossman S Kiora Hallberg PA-C 05/23/2019 9:45 AM

## 2019-05-23 NOTE — Progress Notes (Signed)
HEMATOLOGY-ONCOLOGY PROGRESS NOTE  SUBJECTIVE: Sent to ER by home health due to hypoxia. CXR from the ER showed COPD changes with bibasilar atelectasis vs. Infiltrate much greater on the right. He was started on IV antibiotics. Chart has been reviewed. Family requesting feeding tube placement due to poor nutrition. Has been seen by RD who has placed the patient on Ensure and Prostat. Calorie count in process.  Palliative care consult has been placed and is pending. The patient recently completed concurrent chemoradiation for stage IIIA nsclca. Last chemo was given on 05/02/2019 and last radiation treatment was given 05/10/2019.   He is currently afebrile. BC negative to date.  The patient is resting quietly in his bed this morning.  He reports that his breathing is stable.  He denies pain.  The patient is unable to provide a comprehensive review of systems to me.  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[place])   01/24/2019 -  Chemotherapy   The patient had palonosetron (ALOXI) injection 0.25 mg, 0.25 mg, Intravenous,  Once, 9 of 9 cycles Administration: 0.25 mg (01/24/2019), 0.25 mg (03/28/2019), 0.25 mg (04/04/2019), 0.25 mg (02/22/2019), 0.25 mg (04/11/2019), 0.25 mg (03/07/2019), 0.25 mg (03/21/2019), 0.25 mg (04/18/2019), 0.25 mg (05/02/2019) CARBOplatin (PARAPLATIN) 170 mg in sodium chloride 0.9 % 250 mL chemo infusion, 170 mg (100 % of original dose 172.8 mg), Intravenous,  Once, 9 of 9 cycles Dose modification: 172.8 mg (original dose 172.8 mg, Cycle 1) Administration: 170 mg (01/24/2019), 170 mg (03/28/2019), 170 mg (04/04/2019), 170 mg (02/22/2019), 170 mg (04/11/2019), 170 mg (03/07/2019), 170 mg (03/21/2019), 170 mg (04/18/2019), 170 mg (05/02/2019) PACLitaxel (TAXOL) 84 mg in sodium chloride 0.9 % 250 mL chemo infusion (</= 80mg /m2), 45 mg/m2 = 84 mg, Intravenous,  Once, 9 of 9  cycles Administration: 84 mg (01/24/2019), 84 mg (03/28/2019), 84 mg (04/04/2019), 84 mg (02/22/2019), 84 mg (04/11/2019), 84 mg (03/07/2019), 84 mg (03/21/2019), 84 mg (04/18/2019), 84 mg (05/02/2019)  for chemotherapy treatment.       REVIEW OF SYSTEMS:   As noted in the HPI.  I have reviewed the past medical history, past surgical history, social history and family history with the patient and they are unchanged from previous note.   PHYSICAL EXAMINATION: ECOG PERFORMANCE STATUS: 2 - Symptomatic, <50% confined to bed  Vitals:   05/22/19 1321 05/22/19 2106  BP: (!) 103/52 111/79  Pulse: 99 (!) 107  Resp: 15   Temp: 98.3 F (36.8 C) 98.9 F (37.2 C)  SpO2: 92% 92%   Filed Weights   05/18/19 1450 05/19/19 0325  Weight: 140 lb 3.4 oz (63.6 kg) 134 lb 4.2 oz (60.9 kg)    Intake/Output from previous day: 12/06 0701 - 12/07 0700 In: 148 [P.O.:148] Out: -   GENERAL: Alert, looks comfortable. OROPHARYNX: No thrush or mucositis LUNGS: Clear to auscultation, poor inspiratory effort HEART: regular rate & rhythm and no murmurs and no lower extremity edema ABDOMEN:abdomen soft, non-tender and normal bowel sounds Musculoskeletal:no cyanosis of digits and no clubbing  NEURO: Alert, oriented to person.  He knows that he is in the hospital but is unable to tell me name of the hospital.  He thinks it is 32.  LABORATORY DATA:  I have reviewed the data as listed CMP Latest Ref Rng & Units 05/20/2019 05/18/2019 05/09/2019  Glucose 70 - 99 mg/dL - 132(H) -  BUN 8 - 23 mg/dL -  14 -  Creatinine 0.61 - 1.24 mg/dL 0.58(L) 0.71 0.58(L)  Sodium 135 - 145 mmol/L - 141 -  Potassium 3.5 - 5.1 mmol/L - 4.3 -  Chloride 98 - 111 mmol/L - 102 -  CO2 22 - 32 mmol/L - 28 -  Calcium 8.9 - 10.3 mg/dL - 9.5 -  Total Protein 6.5 - 8.1 g/dL - 7.5 -  Total Bilirubin 0.3 - 1.2 mg/dL - 0.6 -  Alkaline Phos 38 - 126 U/L - 82 -  AST 15 - 41 U/L - 19 -  ALT 0 - 44 U/L - 14 -    Lab Results  Component  Value Date   WBC 6.4 05/18/2019   HGB 11.3 (L) 05/18/2019   HCT 35.8 (L) 05/18/2019   MCV 64.5 (L) 05/18/2019   PLT 394 05/18/2019   NEUTROABS 4.5 05/18/2019    Dg Chest Port 1 View  Result Date: 05/18/2019 CLINICAL DATA:  Failure to thrive EXAM: PORTABLE CHEST 1 VIEW COMPARISON:  Portable exam 1522 hours compared to 05/08/2019 FINDINGS: Normal heart size, mediastinal contours, and pulmonary vascularity. Atherosclerotic calcification aorta. Emphysematous changes and bronchitic changes consistent with COPD. Persistent opacity at RIGHT lung base question pneumonia. Radiographic follow-up until resolution recommended to exclude underlying abnormalities including tumor. Minimal atelectasis or infiltrate at LEFT base. No pleural effusion or pneumothorax. Bones demineralized. IMPRESSION: COPD changes with with bibasilar atelectasis versus infiltrate much greater on RIGHT; radiographic follow-up until resolution recommended to exclude underlying abnormalities including tumor. Aortic Atherosclerosis (ICD10-I70.0). Electronically Signed   By: Lavonia Dana M.D.   On: 05/18/2019 15:46   Dg Chest Port 1 View  Result Date: 05/08/2019 CLINICAL DATA:  Cough and fever EXAM: PORTABLE CHEST 1 VIEW COMPARISON:  03/14/2019 FINDINGS: The lung bases were not fully visualized on this exam. Again noted is a large mass within the right middle lobe with complete collapse of the right middle lobe. This appearance is similar to prior chest x-ray dated 03/14/2019 with some proved aeration in the right middle lobe. The heart size is stable from prior study. There is elevation of the right hemidiaphragm. There is mild shift of the mediastinum to the right. IMPRESSION: 1. Limited study. The lung bases were not fully visualized on this exam. 2. Again noted is a right middle lobe mass with near complete collapse of the right middle lobe. 3. No definite focal infiltrate identified on this exam given the limitations described above.  Electronically Signed   By: Constance Holster M.D.   On: 05/08/2019 15:19    ASSESSMENT AND PLAN: 1. Stage IIIA nsclca, squamous cell 2. HCAP vs. Post-obstructive pneumonia 3. Protein calorie malnutrition 4. Mild anemia 5. Dementia 6. Atrial fibrillation 7. History of PE  -The patient has completed his planned course of concurrent chemoradiation and tolerated it well with exception of anorexia and weight loss.  Plan is for restaging CT scan of the chest on 05/26/2019 and follow-up visit on 05/30/2019 to discuss the results and further plans for management of his lung cancer. -The patient is currently admitted for HCAP versus postobstructive pneumonia.  Continue antibiotics per hospitalist.  He continues to remain afebrile and blood cultures are negative. -The patient has had continued weight loss and anorexia.  Dietitian is following.  Calorie count is currently in process.  Family requesting a feeding tube and will defer to primary team regarding placement.  A palliative care consult is also currently pending to discuss goals of care.  If a feeding tube is in line  with his goals of care, it would be okay to place a feeding tube from our perspective. -The patient's hemoglobin is stable.  Anemia is likely related to recent chemotherapy.  Monitor closely. -He remains on Xarelto for A. fib and history of PE.  No bleeding noted.   LOS: 5 days   Mikey Bussing, DNP, AGPCNP-BC, AOCNP 05/23/19

## 2019-05-23 NOTE — Progress Notes (Signed)
Pt is currently refusing for NT to obtain VS.

## 2019-05-23 NOTE — Progress Notes (Signed)
Nutrition Follow-up  DOCUMENTATION CODES:   Underweight  INTERVENTION:  - continue Ensure Enlive BID and 30 ml prostat BID.  - will monitor for plan concerning PEG. - if appropriate, will follow-up 12/8 and 12/9 for Calorie Count results.  - if PEG placed, recommend Osmolite 1.2 @ 25 ml/hr to advance by 10 ml every 12 hours to reach goal rate of 65 ml/hr with 30 ml prostat once/day. - at goal rate, this regimen will provide 1972 kcal, 101 grams protein, and 1279 ml free water.   Monitor magnesium, potassium, and phosphorus daily for at least 3 days, MD to replete as needed, as pt is at risk for refeeding syndrome given prolonged period with inadequate nutrition, likely malnutrition.    NUTRITION DIAGNOSIS:   Increased nutrient needs related to chronic illness, cancer and cancer related treatments, acute illness as evidenced by estimated needs. -ongoing  GOAL:   Patient will meet greater than or equal to 90% of their needs -unmet  MONITOR:   PO intake, Supplement acceptance, Labs, Weight trends, Skin  REASON FOR ASSESSMENT:   Consult Calorie Count, Assessment of nutrition requirement/status  ASSESSMENT:   78 year old male with dementia, A. fib with RVR, HTN, hyperlipidemia, history of R common femoral DVT on 01/31/2019, and stage 3 NSCLC. He presented to the ED from home with poor oral intake and hypoxia. CXR in the EDD showed COPD changes with bibasilar atelectasis vs infiltrate (R much greater than L). COVID-19 test negative.  Calorie Count started. Patient consumed 0% of breakfast and lunch yesterday, no intakes documented today, and he was made NPO today at ~1300. Able to talk with RN outside of the room and she reports that patient has had some liquids today, including Ensure.   Per review of orders, patient has accepted 8 of 9 bottles of Ensure offered and 7 of 9 packets of prostat offered. Ensure on bed side table mainly full, ginger ale on bedside table ~2/3 full.    Flow sheet indicates patient is a/o to self only. He was unable to provide any information at the time of visit and no family/visitors present. Patient was covered from neck to toes in blanket. RD touched blanket and asked to remove it for a moment to complete NFPE and patient loudly and sternly stated, "no, no, don't you dare do that." NFPE unable to be performed. Highly suspect patient meets criteria for malnutrition but unable to confirm with patient's refusal for NFPE to be done.   Yesterday family had requested that another MD take over patient's care. Switch has been made. Family is disinterested in SNF placement and so far in this hospitalization has refused to talk with Palliative Care about [facility]. Family has been very interested in PEG placement (MD note from today states "patient's daughter wants PEG placed immediately").  Per notes: - HCAP--possibly post-obstructive - NSCLC dx in 12/2018--CT had been planned for 12/10 - plan for Oncology consult for 12/8 AM for [place] and consult to Palliative Care - severe PCM with anorexia - IR has been consulted for PEG  - dementia   Labs reviewed; creatinine: 0.58 mg/dl. Medications reviewed; 2.5 mg marinol BID started 12/4.    NUTRITION - FOCUSED PHYSICAL EXAM:  unable to perform due to patient adamantly refusing.   Diet Order:   Diet Order            Diet NPO time specified  Diet effective midnight        Diet Heart Room service appropriate? Yes; Fluid consistency:  Thin  Diet effective now              EDUCATION NEEDS:   No education needs have been identified at this time  Skin:  Skin Assessment: Skin Integrity Issues: Skin Integrity Issues:: Stage I, Stage II Stage I: bilateral hips Stage II: L hip  Last BM:  12/6  Height:   Ht Readings from Last 1 Encounters:  05/19/19 6\' 2"  (1.88 m)    Weight:   Wt Readings from Last 1 Encounters:  05/19/19 60.9 kg    Ideal Body Weight:  86.4 kg  BMI:  Body mass index is  17.24 kg/m.  Estimated Nutritional Needs:   Kcal:  1900-2100 kcal  Protein:  95-105 grams  Fluid:  >/= 2 L/day     Jarome Matin, MS, RD, LDN, [facility] Inpatient Clinical Dietitian Pager # [phone] After hours/weekend pager # [phone]

## 2019-05-23 NOTE — Progress Notes (Signed)
PROGRESS NOTE    Richard [name]   XLK:[id number]  DOB: [birth year]  DOA: 05/18/2019 PCP: Richard Perna, NP   Brief Narrative:  Richard [name] is a 78 year old male with dementia, A. fib with RVR, hypertension, hyperlipidemia, history of right common femoral DVT on 01/31/2019, stage III non-small cell lung cancer who presents from home with poor oral intake and hypoxia on ambulation when checked by his home health RN.  Chest x-ray in DG:UYQI changes with with bibasilar atelectasis versus infiltrate much greater on RIGHT (these are chronic changes when compared to prior chest x-rays).  No constitutional symptoms suggestive of pneumonic process. Lactic acid 2.5 Covid negative. Tachycardic - blood pressure as low as 95/58  He was last admitted to the hospital from 11/22-11/24 with suspected sepsis, the source of which was not fully determined.  He was given fosfomycin for suspected UTI and also discharged with 5 days of Augmentin.  05/21/2019: Patient seen.  Patient looks a lot better today.  Patient appetite seems to be improving.  Will order procalcitonin.  Caloric count.  Consult dietary team.  Further management depend on hospital course.  05/22/2019: No fever, no leukocytosis, no signs of pneumonia.  Chest x-ray revealed old bibasilar changes.  Procalcitonin is less than 0.1.  I doubt pneumonia.  Will discontinue IV cefepime.  Patient continues to have poor p.o. intake despite being on dronabinol.  Discussed with patient's daughter extensively.  Communicated need to involve palliative care team.  Patient's daughter wants PEG tube placed immediately.  I have advised the patient's daughter of the need to have palliative care team perform a formal consultation so goal of care can be defined.  Please have low threshold to consult patient's oncologist, Richard [name] in the morning.  Patient may be failing to thrive.  Subjective: Pt reports no complaints.  Remains agreeable to PEG placement.  He has  capacity at this time.   Assessment & Plan:     HCAP (healthcare-associated pneumonia) -Right lower lobe infiltrate with lactic acidosis, tachycardia, hypotension-this may be a postobstructive pneumonia as he does have a right middle lobe obstructing mass -Lactic acidosis has improved after IV fluids however he continued to be tachycardic -this has now resolved -Strep pneumonia antigen and HIV negative -He was started on vancomycin and Cefepime to cover for HCAP-  MRSA PCR is found to be negative and therefore vancomycin was discontinued -Continue cefepime-his sepsis has improved 05/21/2019: Check procalcitonin.  Continue antibiotics for now. 05/22/2019: Please see above.  Procalcitonin is less than 0.1.  No constitutional symptoms of pneumonia.  discontinued IV cefepime.  Non-small cell cancer-diagnosed in July 2020 - Most recent CT was in August 2020 and revealed a spiculated perihilar mass with obstruction of the right middle lobe and pretracheal enlarged lymph nodes.  He also had a pulmonary nodule in the left lower lobe and a moderate right-sided pleural effusion. -- He has received 8 cycles of chemotherapy Richard [name]  --  follow-up CT is planned for 12/10, per his daughter 05/22/2019: Please consult patient's oncologist in the morning, Richard [name].  Goal of care will need to be discussed.  Consult palliative care team.  Protein-calorie malnutrition, severe with anorexia -Dietitian consult requested-continue supplements- the patient does not appear to have and appetite which may be related to underlying cancer or recent chemo - His daughter whom he lives with, Richard [name], would like him to have a feeding tube if he does not start eating/ drinking - I will start Marinol- follow oral intake  05/21/2019: Caloric count.  Dietary consult. 05/23/2019: Pt remains adamant that he will get PEG.  Consulted to IR for PEG placement.  Holding Xarelto.  NPO after midnight for tentative PEG placement  05/24/19.     A-fib - cont Xarelto-I have noted that he is not on any rate controlling agent -at this time he remains in NSR  Dementia - follow for behavioral disturbances- might need sitter or medications as he did so on last admission  No behavioral problems at this time, haldol ordered as needed.    h/o PE in August 2020 - cont Xarelto    Time spent in minutes: 25 DVT prophylaxis: Xarelto Code Status: DNR Family Communication:  Disposition Plan: Home soon after PEG placed, family resistant to SNF want to take home Consultants:   none Procedures:   none Antimicrobials:  Anti-infectives (From admission, onward)   Start     Dose/Rate Route Frequency Ordered Stop   05/19/19 1800  vancomycin (VANCOCIN) IVPB 1000 mg/200 mL premix  Status:  Discontinued     1,000 mg 200 mL/hr over 60 Minutes Intravenous Every 24 hours 05/19/19 1141 05/20/19 1124   05/19/19 0800  vancomycin (VANCOCIN) IVPB 750 mg/150 ml premix  Status:  Discontinued     750 mg 150 mL/hr over 60 Minutes Intravenous Every 12 hours 05/18/19 2025 05/19/19 1139   05/19/19 0200  ceFEPIme (MAXIPIME) 2 g in sodium chloride 0.9 % 100 mL IVPB  Status:  Discontinued     2 g 200 mL/hr over 30 Minutes Intravenous Every 8 hours 05/18/19 2025 05/22/19 1658   05/18/19 1815  vancomycin (VANCOCIN) 1,250 mg in sodium chloride 0.9 % 250 mL IVPB     1,250 mg 166.7 mL/hr over 90 Minutes Intravenous  Once 05/18/19 1806 05/18/19 2058   05/18/19 1815  piperacillin-tazobactam (ZOSYN) IVPB 3.375 g     3.375 g 100 mL/hr over 30 Minutes Intravenous  Once 05/18/19 1806 05/18/19 1921       Objective: Vitals:   05/22/19 0309 05/22/19 0512 05/22/19 1321 05/22/19 2106  BP: 106/62 113/69 (!) 103/52 111/79  Pulse: (!) 106 (!) 108 99 (!) 107  Resp: 20 20 15    Temp: 98.7 F (37.1 C) 98.7 F (37.1 C) 98.3 F (36.8 C) 98.9 F (37.2 C)  TempSrc: Oral Oral Oral Oral  SpO2: 94% 93% 92% 92%  Weight:      Height:        Intake/Output  Summary (Last 24 hours) at 05/23/2019 1259 Last data filed at 05/23/2019 0309 Gross per 24 hour  Intake 148 ml  Output -  Net 148 ml   Filed Weights   05/18/19 1450 05/19/19 0325  Weight: 63.6 kg 60.9 kg    Examination: General exam: emaciated chronically ill appearing cachectic appearing HEENT: ncat PERRL Respiratory system: Clear to auscultation. Cardiovascular system: S1 & S2 heard  Gastrointestinal system: Abdomen soft, non-tender, nondistended. Normal bowel sounds   Central nervous system:  Awake and alert.  Patient moves all extremities.   Extremities: No leg edema.  Data Reviewed: I have personally reviewed following labs and imaging studies  CBC: Recent Labs  Lab 05/18/19 1615  WBC 6.4  NEUTROABS 4.5  HGB 11.3*  HCT 35.8*  MCV 64.5*  PLT 825   Basic Metabolic Panel: Recent Labs  Lab 05/18/19 1615 05/20/19 0400  NA 141  --   K 4.3  --   CL 102  --   CO2 28  --   GLUCOSE 132*  --  BUN 14  --   CREATININE 0.71 0.58*  CALCIUM 9.5  --    GFR: Estimated Creatinine Clearance: 67.7 mL/min (A) (by C-G formula based on SCr of 0.58 mg/dL (L)). Liver Function Tests: Recent Labs  Lab 05/18/19 1615  AST 19  ALT 14  ALKPHOS 82  BILITOT 0.6  PROT 7.5  ALBUMIN 3.0*   Recent Labs  Lab 05/18/19 1615  LIPASE 21   No results for input(s): AMMONIA in the last 168 hours. Coagulation Profile: No results for input(s): INR, PROTIME in the last 168 hours. Cardiac Enzymes: No results for input(s): CKTOTAL, CKMB, CKMBINDEX, TROPONINI in the last 168 hours. BNP (last 3 results) No results for input(s): PROBNP in the last 8760 hours. HbA1C: No results for input(s): HGBA1C in the last 72 hours. CBG: No results for input(s): GLUCAP in the last 168 hours. Lipid Profile: No results for input(s): CHOL, HDL, LDLCALC, TRIG, CHOLHDL, LDLDIRECT in the last 72 hours. Thyroid Function Tests: No results for input(s): TSH, T4TOTAL, FREET4, T3FREE, THYROIDAB in the last 72  hours. Anemia Panel: No results for input(s): VITAMINB12, FOLATE, FERRITIN, TIBC, IRON, RETICCTPCT in the last 72 hours. Urine analysis:    Component Value Date/Time   COLORURINE YELLOW 05/19/2019 1913   APPEARANCEUR CLEAR 05/19/2019 1913   LABSPEC 1.020 05/19/2019 1913   PHURINE 8.0 05/19/2019 1913   GLUCOSEU NEGATIVE 05/19/2019 [place] NEGATIVE 05/19/2019 [place] NEGATIVE 05/19/2019 [place] NEGATIVE 05/19/2019 1913   PROTEINUR NEGATIVE 05/19/2019 1913   NITRITE NEGATIVE 05/19/2019 1913   LEUKOCYTESUR NEGATIVE 05/19/2019 1913   Recent Results (from the past 240 hour(s))  Culture, blood (routine x 2)     Status: None (Preliminary result)   Collection Time: 05/18/19  4:15 PM   Specimen: BLOOD  Result Value Ref Range Status   Specimen Description   Final    BLOOD RIGHT ANTECUBITAL Performed at [facility], [place] [address], [place], [place] [address]    Special Requests   Final    BOTTLES DRAWN AEROBIC AND ANAEROBIC Blood Culture adequate volume Performed at [place] [address], [place], [place] [address]    Culture   Final    NO GROWTH 4 DAYS Performed at [facility], [place] [address], [place],  [address]    Report Status PENDING  Incomplete  SARS CORONAVIRUS 2 (TAT 6-24 HRS) Nasopharyngeal Nasopharyngeal Swab     Status: None   Collection Time: 05/18/19  6:19 PM   Specimen: Nasopharyngeal Swab  Result Value Ref Range Status   SARS Coronavirus 2 NEGATIVE NEGATIVE Final    Comment: (NOTE) SARS-CoV-2 target nucleic acids are NOT DETECTED. The SARS-CoV-2 RNA is generally detectable in upper and lower respiratory specimens during the acute phase of infection. Negative results do not preclude SARS-CoV-2 infection, do not rule out co-infections with other pathogens, and should not be used as the sole basis for treatment or other patient management decisions. Negative results must be combined with clinical  observations, patient history, and epidemiological information. The expected result is Negative. Fact Sheet for Patients: SugarRoll.be Fact Sheet for Healthcare Providers: https://www.woods-mathews.com/ This test is not yet approved or cleared by the [place] FDA and  has been authorized for detection and/or diagnosis of SARS-CoV-2 by FDA under an Emergency Use Authorization (EUA). This EUA will remain  in effect (meaning this test can be used) for the duration of the COVID-19 declaration under Section 56 4(b)(1) of  the Act, 21 U.S.C. section 360bbb-3(b)(1), unless the authorization is terminated or revoked sooner. Performed at [facility], [place] [address] [place], [place] [address]   Culture, blood (routine x 2)     Status: None (Preliminary result)   Collection Time: 05/19/19  4:03 AM   Specimen: BLOOD LEFT HAND  Result Value Ref Range Status   Specimen Description   Final    BLOOD LEFT HAND Performed at [facility] [address], [place], [place] [address]    Special Requests   Final    BOTTLES DRAWN AEROBIC ONLY Blood Culture results may not be optimal due to an inadequate volume of blood received in culture bottles Performed at [place] [address], [place], [place] [address]    Culture   Final    NO GROWTH 3 DAYS Performed at [facility], [place] [address] [place], [place] [address]    Report Status PENDING  Incomplete  MRSA PCR Screening     Status: None   Collection Time: 05/20/19  7:51 AM   Specimen: Nasal Mucosa; Nasopharyngeal  Result Value Ref Range Status   MRSA by PCR NEGATIVE NEGATIVE Final    Comment:        The GeneXpert MRSA Assay (FDA approved for NASAL specimens only), is one component of a comprehensive MRSA colonization surveillance program. It is not intended to diagnose MRSA infection nor to guide or monitor treatment for MRSA infections.  Performed at [facility], [place] [address], [place], [place] [address]    Radiology Studies: Dg Chest Port 1 View  Result Date: 05/23/2019 CLINICAL DATA:  Follow-up right lower lobe pneumonia. History of non-small cell lung cancer. EXAM: PORTABLE CHEST 1 VIEW COMPARISON:  05/18/2019 and 05/08/2019 FINDINGS: Persistent hazy and patchy densities in the right hilum and right lower lung. Findings have minimally changed since 05/18/2019. Slightly prominent interstitial densities in the right lung compared to the left. Left lung remains clear. Heart and mediastinum are within normal limits. Atherosclerotic calcifications at the aortic arch. IMPRESSION: No significant change in the hazy and patchy densities in the right lower lung and right hilar region. Findings could represent a combination of patient's known malignancy with underlying infection/inflammation. Electronically Signed   By: Markus Daft M.D.   On: 05/23/2019 08:51   Scheduled Meds: . dronabinol  2.5 mg Oral BID AC  . feeding supplement (ENSURE ENLIVE)  237 mL Oral BID BM  . feeding supplement (PRO-STAT SUGAR FREE 64)  30 mL Oral BID  . [START ON 05/25/2019] rivaroxaban  20 mg Oral Q supper  . sodium chloride flush  10-40 mL Intracatheter Q12H   Continuous Infusions:    LOS: 5 days   Irwin Brakeman, MD [facility] Pager: [email or web address] Password [facility] 05/23/2019, 12:59 PM

## 2019-05-23 NOTE — Care Management Important Message (Signed)
Important Message  Patient Details IM Letter given to Gabriel Earing RN to present to the Patient Name: Richard [name] MRN: [id number] Date of Birth: 06-21-1940   Medicare Important Message Given:  Yes     Kerin Salen 05/23/2019, 11:37 AM

## 2019-05-24 DIAGNOSIS — Z7189 Other specified counseling: Secondary | ICD-10-CM

## 2019-05-24 DIAGNOSIS — Z515 Encounter for palliative care: Secondary | ICD-10-CM

## 2019-05-24 LAB — CULTURE, BLOOD (ROUTINE X 2): Culture: NO GROWTH

## 2019-05-24 MED ORDER — CEFAZOLIN SODIUM-DEXTROSE 2-4 GM/100ML-% IV SOLN
2.0000 g | INTRAVENOUS | Status: AC
  Filled 2019-05-24: qty 100

## 2019-05-24 MED ORDER — LIP MEDEX EX OINT
TOPICAL_OINTMENT | CUTANEOUS | Status: AC
  Administered 2019-05-24: 18:00:00
  Filled 2019-05-24: qty 7

## 2019-05-24 NOTE — Consult Note (Signed)
Chief Complaint: Patient was seen in consultation today for possible percutaneous gastrostomy tube placement Chief Complaint  Patient presents with   Failure To Thrive    Referring Physician(s): Joseph,P  Supervising Physician: Markus Daft  Patient Status: [facility] - In-pt  History of Present Illness: Richard [name] is a 78 y.o. male with history of dementia, A. Fib on xarelto,COPD, hypertension, hyperlipidemia, PE/ right common femoral DVT diagnosed in August of this year, stage IIIa non-small cell lung cancer with prior chemotherapy who was recently admitted to [facility] with poor oral intake/hypoxia and treated for pneumonia.Marland Kitchen  He was treated for suspected UTI last month.  He is COVID-19 negative.  He continues to have severe anorexia, very poor oral intake and severe malnutrition.  Request now received from primary care team for gastrostomy tube placement.  Past Medical History:  Diagnosis Date   Dementia ([place])    High cholesterol    Hypertension    Lung mass 12/2018    Past Surgical History:  Procedure Laterality Date   JOINT REPLACEMENT     VIDEO BRONCHOSCOPY WITH ENDOBRONCHIAL ULTRASOUND N/A 12/16/2018   Procedure: VIDEO BRONCHOSCOPY WITH ENDOBRONCHIAL ULTRASOUND AND FLUROSCOPY;  Surgeon: [name], MD;  Location: [facility] OR;  Service: Pulmonary;  Laterality: N/A;    Allergies: Patient has no known allergies.  Medications: Prior to Admission medications   Medication Sig Start Date End Date Taking? Authorizing Provider  ferrous sulfate 325 (65 FE) MG tablet Take 325 mg by mouth 2 (two) times daily with a meal.   Yes [provider]  Multiple Vitamin (MULTIVITAMIN) tablet Take 1 tablet by mouth daily.   Yes [provider]  Omega-3 Fatty Acids (FISH OIL) 1200 MG CAPS Take 1,200 mg by mouth daily.   Yes [provider]  prochlorperazine (COMPAZINE) 10 MG tablet Take 1 tablet (10 mg total) by mouth every 6 (six) hours as needed for nausea or  vomiting. 01/12/19  Yes Heilingoetter, Cassandra L, PA-C  rivaroxaban (XARELTO) 20 MG TABS tablet Take 1 tablet (20 mg total) by mouth daily with supper. 04/20/19  Yes Curt Bears, MD     Family History  Problem Relation Age of Onset   Stomach cancer Mother    COPD Father    Lung cancer Brother    Lung cancer Brother     Social History   Socioeconomic History   Marital status: Divorced    Spouse name: Not on file   Number of children: Not on file   Years of education: Not on file   Highest education level: Not on file  Occupational History   Not on file  Social Needs   Financial resource strain: Not on file   Food insecurity    Worry: Not on file    Inability: Not on file   Transportation needs    Medical: Not on file    Non-medical: Not on file  Tobacco Use   Smoking status: Current Some Day Smoker    Packs/day: 1.00    Years: 60.00    Pack years: 60.00    Types: Cigarettes   Smokeless tobacco: Never Used  Substance and Sexual Activity   Alcohol use: Yes    Comment: occasional   Drug use: No   Sexual activity: Not on file  Lifestyle   Physical activity    Days per week: Not on file    Minutes per session: Not on file   Stress: Not on file  Relationships   Social connections  Talks on phone: Not on file    Gets together: Not on file    Attends religious service: Not on file    Active member of club or organization: Not on file    Attends meetings of clubs or organizations: Not on file    Relationship status: Not on file  Other Topics Concern   Not on file  Social History Narrative   Not on file      Review of Systems difficult to ascertain due to current mental state but currently denies pain or worsening dyspnea/nausea /vomiting at this time.  Vital Signs: BP 109/61 (BP Location: Right Arm)    Pulse 100    Temp 98 F (36.7 C) (Oral)    Resp 17    Ht 6\' 2"  (1.88 m)    Wt 134 lb 4.2 oz (60.9 kg)    SpO2 94%    BMI 17.24  kg/m   Physical Exam patient awake, poor insight and judgment.  Cachectic appearing;  chest with distant breath sounds bilaterally , more notably on the right, faint rhonchi.  Heart with regular rate and rhythm.  Abdomen soft, positive bowel sounds, nontender.  No lower extremity edema.  Imaging: Dg Chest Port 1 View  Result Date: 05/23/2019 CLINICAL DATA:  Follow-up right lower lobe pneumonia. History of non-small cell lung cancer. EXAM: PORTABLE CHEST 1 VIEW COMPARISON:  05/18/2019 and 05/08/2019 FINDINGS: Persistent hazy and patchy densities in the right hilum and right lower lung. Findings have minimally changed since 05/18/2019. Slightly prominent interstitial densities in the right lung compared to the left. Left lung remains clear. Heart and mediastinum are within normal limits. Atherosclerotic calcifications at the aortic arch. IMPRESSION: No significant change in the hazy and patchy densities in the right lower lung and right hilar region. Findings could represent a combination of patient's known malignancy with underlying infection/inflammation. Electronically Signed   By: Markus Daft M.D.   On: 05/23/2019 08:51   Dg Chest Port 1 View  Result Date: 05/18/2019 CLINICAL DATA:  Failure to thrive EXAM: PORTABLE CHEST 1 VIEW COMPARISON:  Portable exam 1522 hours compared to 05/08/2019 FINDINGS: Normal heart size, mediastinal contours, and pulmonary vascularity. Atherosclerotic calcification aorta. Emphysematous changes and bronchitic changes consistent with COPD. Persistent opacity at RIGHT lung base question pneumonia. Radiographic follow-up until resolution recommended to exclude underlying abnormalities including tumor. Minimal atelectasis or infiltrate at LEFT base. No pleural effusion or pneumothorax. Bones demineralized. IMPRESSION: COPD changes with with bibasilar atelectasis versus infiltrate much greater on RIGHT; radiographic follow-up until resolution recommended to exclude underlying  abnormalities including tumor. Aortic Atherosclerosis (ICD10-I70.0). Electronically Signed   By: Lavonia Dana M.D.   On: 05/18/2019 15:46   Dg Chest Port 1 View  Result Date: 05/08/2019 CLINICAL DATA:  Cough and fever EXAM: PORTABLE CHEST 1 VIEW COMPARISON:  03/14/2019 FINDINGS: The lung bases were not fully visualized on this exam. Again noted is a large mass within the right middle lobe with complete collapse of the right middle lobe. This appearance is similar to prior chest x-ray dated 03/14/2019 with some proved aeration in the right middle lobe. The heart size is stable from prior study. There is elevation of the right hemidiaphragm. There is mild shift of the mediastinum to the right. IMPRESSION: 1. Limited study. The lung bases were not fully visualized on this exam. 2. Again noted is a right middle lobe mass with near complete collapse of the right middle lobe. 3. No definite focal infiltrate identified on this exam  given the limitations described above. Electronically Signed   By: Constance Holster M.D.   On: 05/08/2019 15:19    Labs:  CBC: Recent Labs    04/18/19 1151 05/02/19 0825 05/08/19 1538 05/18/19 1615  WBC 2.7* 4.5 8.9 6.4  HGB 10.5* 11.6* 11.1* 11.3*  HCT 32.2* 35.7* 35.4* 35.8*  PLT 286 336 382 394    COAGS: Recent Labs    12/16/18 0552 01/30/19 1542 05/08/19 1538  INR 1.2 1.2 1.8*  APTT 34  --  45*    BMP: Recent Labs    04/18/19 1151 05/02/19 0825 05/08/19 1740 05/09/19 0431 05/18/19 1615 05/20/19 0400  NA 141 138 138  --  141  --   K 4.8 4.9 4.8  --  4.3  --   CL 104 102 107  --  102  --   CO2 26 23 22   --  28  --   GLUCOSE 108* 121* 111*  --  132*  --   BUN 10 11 21   --  14  --   CALCIUM 9.7 9.4 8.8*  --  9.5  --   CREATININE 0.73 0.78 0.87 0.58* 0.71 0.58*  GFRNONAA >60 >60 >60 >60 >60 >60  GFRAA >60 >60 >60 >60 >60 >60    LIVER FUNCTION TESTS: Recent Labs    04/18/19 1151 05/02/19 0825 05/08/19 1740 05/18/19 1615  BILITOT 0.3 0.3  0.5 0.6  AST 15 19 28 19   ALT 17 18 22 14   ALKPHOS 99 116 81 82  PROT 6.8 7.0 6.4* 7.5  ALBUMIN 3.1* 3.1* 3.0* 3.0*    TUMOR MARKERS: No results for input(s): AFPTM, CEA, CA199, CHROMGRNA in the last 8760 hours.  Assessment and Plan: 78 y.o. male with history of dementia, A. Fib on xarelto,COPD, hypertension, hyperlipidemia, PE/ right common femoral DVT diagnosed in August of this year, stage IIIa non-small cell lung cancer with prior chemotherapy who was recently admitted to [facility] with poor oral intake/hypoxia and treated for pneumonia.Marland Kitchen  He was treated for suspected UTI last month.  He is COVID-19 negative.  He continues to have severe anorexia, very poor oral intake and severe malnutrition.  Request now received from primary care team for gastrostomy tube placement.Risks and benefits image guided gastrostomy tube placement was discussed with the patient's daughter, Richard [name], including, but not limited to the need for a barium enema during the procedure, bleeding, infection, peritonitis and/or damage to adjacent structures.  All of the patient's questions were answered, patient is agreeable to proceed.  Consent signed and in chart.  Procedure tentatively scheduled for tomorrow am if pt cooperative  Thank you for this interesting consult.  I greatly enjoyed meeting [place] and look forward to participating in their care.  A copy of this report was sent to the requesting provider on this date.  Electronically Signed: D. Rowe Robert, PA-C 05/24/2019, 2:35 PM   I spent a total of  25 minutes   in face to face in clinical consultation, greater than 50% of which was counseling/coordinating care for possible percutaneous gastrostomy tube placement

## 2019-05-24 NOTE — Progress Notes (Signed)
PT Cancellation Note  Patient Details Name: Richard [name] MRN: [id number] DOB: December 05, 1940   Cancelled Treatment:     pt declined any OOB  Attempt despite several tries.  "NO",, "NO",  "NO"  "I just want to finish my nap".  Pt has been evaluated with rec for [place]  PTA Acute  Rehabilitation Services Pager      [phone] Office      [phone]

## 2019-05-24 NOTE — Consult Note (Signed)
Consultation Note Date: 05/24/2019   Patient Name: Richard [name]  DOB: 1941-04-25  MRN: [id number]  Age / Sex: 78 y.o., male  PCP: [name], NP Referring Physician: Domenic Polite, MD  Reason for Consultation: Establishing goals of care  HPI/Patient Profile: 78 y.o. male     admitted on 05/18/2019    Clinical Assessment and Goals of Care: Patient is known to the palliative medicine service, has been seen by my colleagues in the past.  Patient is a 64 year old gentleman with dementia, atrial fibrillation with rapid ventricular response, hypertension and dyslipidemia.  In August 2020, the patient was diagnosed with stage III non-small cell lung cancer.  More recently, he was hospitalized for suspected sepsis from urinary tract infection.  Patient admitted to the hospital with COPD, bibasilar atelectasis hypoxia, poor oral intake.  Patient found to have severe anorexia, malnutrition.  A palliative medicine consultation has been requested for broad goals of care discussions as well as for discussions pertaining to artificial nutrition and hydration options including PEG tube placement.  Patient is a frail elderly gentleman resting in bed.  He is awake reasonably alert.  He answers a few questions appropriately.  He is not in distress.  He believes that he is eating enough.  He denies pain.  He denies shortness of breath.  Call placed and discussed in detail with daughter Richard [name] at [phone].  She recalls previous conversations with palliative care and is aware of palliative services. Palliative medicine is specialized medical care for people living with serious illness. It focuses on providing relief from the symptoms and stress of a serious illness. The goal is to improve quality of life for both the patient and the family.  Goals of care: Broad aims of medical therapy in relation to the  patient's values and preferences. Our aim is to provide medical care aimed at enabling patients to achieve the goals that matter most to them, given the circumstances of their particular medical situation and their constraints.    We reviewed about the patient's current hospitalization.  More specifically, and in more detail, we reviewed about risks and benefits of gastrostomy tube placement.  We discussed about the patient's anorexia, poor oral intake and severe malnutrition.  Discussed about the patient's current cancer treatment.  For now, he has finished treatment.  He is to undergo repeat imaging and restaging soon.  Patient's daughter states that she has discussed with the patient's brother.  They make decisions jointly.  As always, their goals remain to continue to focus on life maintenance/life prolongation by any means necessary.  This includes full code/full scope care.  This includes placement of PEG tube.  I shared with her my concerns frankly but compassionately that the patient has several underlying conditions, especially dementia, if the patient pulled out his PEG tube there could be complications.  Additionally, it would not prevent aspiration episodes.  Patient's daughter remains hopeful that with the placement of a PEG tube, there will be a good source of nutrition established,  although it would be artificial.  Hence, she hopes that the patient would be able to continue further treatment for cancer if need be.  She was thankful for information provided from a palliative care perspective, however, at this time, goals remain to proceed with PEG tube placement, proceed with full code/full scope care.  See additional discussions below.   NEXT OF KIN Daughter and brother  SUMMARY OF RECOMMENDATIONS   Full code, full scope Discussed with daughter [name]: she is anticipating PEG tube placement, understands risks/benefits, wishes to proceed with artificial nutrition and hydration.   Code  Status/Advance Care Planning:  Full code    Symptom Management:      Palliative Prophylaxis:   Bowel Regimen  Additional Recommendations (Limitations, Scope, Preferences):  Full Scope Treatment  Psycho-social/Spiritual:   Desire for further Chaplaincy support:yes  Additional Recommendations: Caregiving  Support/Resources  Prognosis:   Unable to determine  Discharge Planning: To Be Determined      Primary Diagnoses: Present on Admission: . HCAP (healthcare-associated pneumonia) . Dementia without behavioral disturbance ([place]) . Stage III squamous cell carcinoma of right lung ([place])   I have reviewed the medical record, interviewed the patient and family, and examined the patient. The following aspects are pertinent.  Past Medical History:  Diagnosis Date  . Dementia ([place])   . High cholesterol   . Hypertension   . Lung mass 12/2018   Social History   Socioeconomic History  . Marital status: Divorced    Spouse name: Not on file  . Number of children: Not on file  . Years of education: Not on file  . Highest education level: Not on file  Occupational History  . Not on file  Social Needs  . Financial resource strain: Not on file  . Food insecurity    Worry: Not on file    Inability: Not on file  . Transportation needs    Medical: Not on file    Non-medical: Not on file  Tobacco Use  . Smoking status: Current Some Day Smoker    Packs/day: 1.00    Years: 60.00    Pack years: 60.00    Types: Cigarettes  . Smokeless tobacco: Never Used  Substance and Sexual Activity  . Alcohol use: Yes    Comment: occasional  . Drug use: No  . Sexual activity: Not on file  Lifestyle  . Physical activity    Days per week: Not on file    Minutes per session: Not on file  . Stress: Not on file  Relationships  . Social [name] on phone: Not on file    Gets together: Not on file    Attends religious service: Not on file    Active member of club or  organization: Not on file    Attends meetings of clubs or organizations: Not on file    Relationship status: Not on file  Other Topics Concern  . Not on file  Social History Narrative  . Not on file   Family History  Problem Relation Age of Onset  . Stomach cancer Mother   . COPD Father   . Lung cancer Brother   . Lung cancer Brother    Scheduled Meds: . dronabinol  2.5 mg Oral BID AC  . feeding supplement (ENSURE ENLIVE)  237 mL Oral BID BM  . feeding supplement (PRO-STAT SUGAR FREE 64)  30 mL Oral BID  . [START ON 05/25/2019] rivaroxaban  20 mg Oral Q supper  .  sodium chloride flush  10-40 mL Intracatheter Q12H   Continuous Infusions: PRN Meds:.haloperidol lactate, sodium chloride flush Medications Prior to Admission:  Prior to Admission medications   Medication Sig Start Date End Date Taking? Authorizing Provider  ferrous sulfate 325 (65 FE) MG tablet Take 325 mg by mouth 2 (two) times daily with a meal.   Yes [provider]  Multiple Vitamin (MULTIVITAMIN) tablet Take 1 tablet by mouth daily.   Yes [provider]  Omega-3 Fatty Acids (FISH OIL) 1200 MG CAPS Take 1,200 mg by mouth daily.   Yes [provider]  prochlorperazine (COMPAZINE) 10 MG tablet Take 1 tablet (10 mg total) by mouth every 6 (six) hours as needed for nausea or vomiting. 01/12/19  Yes Heilingoetter, Cassandra L, PA-C  rivaroxaban (XARELTO) 20 MG TABS tablet Take 1 tablet (20 mg total) by mouth daily with supper. 04/20/19  Yes Curt Bears, MD   No Known Allergies Review of Systems Denies pain Physical Exam Frail elderly gentleman resting in bed S1-S2 Clear Abdomen soft No edema Muscle wasting Somewhat confused but not agitated  Vital Signs: BP 112/66 (BP Location: Right Arm)   Pulse (!) 101   Temp (!) 97.4 F (36.3 C) (Oral)   Resp 18   Ht 6\' 2"  (1.88 m)   Wt 60.9 kg   SpO2 90%   BMI 17.24 kg/m  Pain Scale: 0-10   Pain Score: 0-No pain   SpO2: SpO2: 90 %  O2 Device:SpO2: 90 % O2 Flow Rate: .O2 Flow Rate (L/min): 1 L/min  IO: Intake/output summary:   Intake/Output Summary (Last 24 hours) at 05/24/2019 1229 Last data filed at 05/23/2019 1818 Gross per 24 hour  Intake 60 ml  Output 825 ml  Net -765 ml    LBM: Last BM Date: 05/22/19 Baseline Weight: Weight: 63.6 kg Most recent weight: Weight: 60.9 kg     Palliative Assessment/Data:    Palliative performance scale 30%  Time In:  12 Time Out:  1300 Time Total:  60 Greater than 50%  of this time was spent counseling and coordinating care related to the above assessment and plan.  Signed by: Loistine Chance, MD   Please contact Palliative Medicine Team phone at [phone] for questions and concerns.  For individual provider: See Shea Evans

## 2019-05-24 NOTE — Progress Notes (Signed)
Calorie Count Note  Full follow-up note completed yesterday (1307). TF recommendations outlined in that note, should they be needed. Diet advanced from NPO to Dysphagia 3, thin liquids today at 0903 and meal at 1100 with 0% completion.   No notes in following RD's note yesterday to indicate plan concerning PEG, GOC, or plan of care moving forward.  Will follow-up concerning Calorie Count again tomorrow (12/9).    Jarome Matin, MS, RD, LDN, [facility] Inpatient Clinical Dietitian Pager # [phone] After hours/weekend pager # [phone]

## 2019-05-24 NOTE — Progress Notes (Signed)
PROGRESS NOTE    Richard [name]   JME:[id number]  DOB: [birth year]  DOA: 05/18/2019 PCP: [name], NP   Brief Narrative:  Richard [name] is a 78 year old male with dementia, A. fib with RVR, hypertension, hyperlipidemia, history of right common femoral DVT on 01/31/2019, stage III non-small cell lung cancer on chemo  presented from home with poor oral intake and hypoxia on ambulation when checked by his home health RN. -Chest x-ray on admission showed COPD changes with bibasilar atelectasis, COVID-19 PCR was negative -Recent hospitalization, 11/22 to 04/28/2023 for suspected sepsis from UTI -Clinically improved with treatment for healthcare associated pneumonia -However continue to have severe anorexia, very poor p.o. intake and severe malnutrition, multiple previous providers had discussions with daughter, [name], palliative care was consulted, daughter continued to insist on PEG tube placement -Eventually IR was consulted by my partners for PEG tube placement pending palliative care discussion -Oncology also aware and NP following  Subjective: -No events overnight, remains pleasantly confused  Assessment & Plan:     HCAP (healthcare-associated pneumonia) versus postobstructive pneumonia -Right lower lobe infiltrate with lactic acidosis, tachycardia, hypotension-this may be a postobstructive pneumonia as he does have a right middle lobe obstructing mass -Initially treated with IV vancomycin and cefepime on admission, subsequently vancomycin was discontinued after 48 hours and cefepime after 4 to 5 days -I do not see SLP evaluation, will order this -Aspiration is also possibility  Non-small cell cancer-diagnosed in July 2020 - Most recent CT was in August 2020 and revealed a spiculated perihilar mass with obstruction of the right middle lobe and pretracheal enlarged lymph nodes.  He also had a pulmonary nodule in the left lower lobe and a moderate right-sided pleural  effusion. -- He has received 8 cycles of chemotherapy Richard [name]  --  follow-up CT is planned for 12/10, per his daughter -Oncology okay with placing a feeding tube from their perspective  Severe protein-calorie malnutrition, severe anorexia, poor p.o. intake -Dietitian consult requested-continue supplements- the patient does not appear to have and appetite which may be related to underlying cancer or recent chemo - His daughter-Richard [name] had multiple discussions with my prior partners and continued to insist on feeding tube placement, he was started on Marinol, RD following, calorie counts ongoing -Yesterday Richard [name] consulted IR for PEG tube placement,  -Palliative team consulted for goals of care, status post goals of care meeting this morning 12/8, daughter continues to insist on PEG tube placement -Timing per IR, will hold xarelto for PEG    A-fib -not on any rate controlling agent -at this time he remains in NSR -On Xarelto  Dementia -Remains pleasantly confused  No behavioral problems at this time, haldol ordered as needed.   h/o PE in August 2020 - on xarelto, hold this for PEG  Time spent in minutes: 38min DVT prophylaxis: Xarelto -hold for PEG Code Status: DNR Family Communication:  Disposition Plan: Home soon after PEG placed, family resistant to SNF want to take home Consultants: Oncology, palliative MD  none Procedures:   none Antimicrobials:  Anti-infectives (From admission, onward)   Start     Dose/Rate Route Frequency Ordered Stop   05/19/19 1800  vancomycin (VANCOCIN) IVPB 1000 mg/200 mL premix  Status:  Discontinued     1,000 mg 200 mL/hr over 60 Minutes Intravenous Every 24 hours 05/19/19 1141 05/20/19 1124   05/19/19 0800  vancomycin (VANCOCIN) IVPB 750 mg/150 ml premix  Status:  Discontinued     750 mg 150  mL/hr over 60 Minutes Intravenous Every 12 hours 05/18/19 2025 05/19/19 1139   05/19/19 0200  ceFEPIme (MAXIPIME) 2 g in sodium chloride 0.9  % 100 mL IVPB  Status:  Discontinued     2 g 200 mL/hr over 30 Minutes Intravenous Every 8 hours 05/18/19 2025 05/22/19 1658   05/18/19 1815  vancomycin (VANCOCIN) 1,250 mg in sodium chloride 0.9 % 250 mL IVPB     1,250 mg 166.7 mL/hr over 90 Minutes Intravenous  Once 05/18/19 1806 05/18/19 2058   05/18/19 1815  piperacillin-tazobactam (ZOSYN) IVPB 3.375 g     3.375 g 100 mL/hr over 30 Minutes Intravenous  Once 05/18/19 1806 05/18/19 1921       Objective: Vitals:   05/22/19 2106 05/23/19 2040 05/24/19 0556 05/24/19 1256  BP: 111/79 (!) 165/140 112/66 109/61  Pulse: (!) 107 89 (!) 101 100  Resp:  18 18 17   Temp: 98.9 F (37.2 C) 97.6 F (36.4 C) (!) 97.4 F (36.3 C) 98 F (36.7 C)  TempSrc: Oral Axillary Oral Oral  SpO2: 92% (!) 87% 90% 94%  Weight:      Height:        Intake/Output Summary (Last 24 hours) at 05/24/2019 1349 Last data filed at 05/24/2019 1259 Gross per 24 hour  Intake 60 ml  Output 1225 ml  Net -1165 ml   Filed Weights   05/18/19 1450 05/19/19 0325  Weight: 63.6 kg 60.9 kg    Examination: Gen: Cachectic chronically ill-appearing male awake alert, pleasantly confused HEENT: PERRLA, Neck supple, no JVD Lungs: Basilar rhonchi CVS: RRR,No Gallops,Rubs or new Murmurs Abd: soft, Non tender, non distended, BS present Extremities: No edema  skin: no new rashes Psych: Poor insight and judgment  Data Reviewed: I have personally reviewed following labs and imaging studies  CBC: Recent Labs  Lab 05/18/19 1615  WBC 6.4  NEUTROABS 4.5  HGB 11.3*  HCT 35.8*  MCV 64.5*  PLT 749   Basic Metabolic Panel: Recent Labs  Lab 05/18/19 1615 05/20/19 0400  NA 141  --   K 4.3  --   CL 102  --   CO2 28  --   GLUCOSE 132*  --   BUN 14  --   CREATININE 0.71 0.58*  CALCIUM 9.5  --    GFR: Estimated Creatinine Clearance: 67.7 mL/min (A) (by C-G formula based on SCr of 0.58 mg/dL (L)). Liver Function Tests: Recent Labs  Lab 05/18/19 1615  AST 19   ALT 14  ALKPHOS 82  BILITOT 0.6  PROT 7.5  ALBUMIN 3.0*   Recent Labs  Lab 05/18/19 1615  LIPASE 21   No results for input(s): AMMONIA in the last 168 hours. Coagulation Profile: No results for input(s): INR, PROTIME in the last 168 hours. Cardiac Enzymes: No results for input(s): CKTOTAL, CKMB, CKMBINDEX, TROPONINI in the last 168 hours. BNP (last 3 results) No results for input(s): PROBNP in the last 8760 hours. HbA1C: No results for input(s): HGBA1C in the last 72 hours. CBG: No results for input(s): GLUCAP in the last 168 hours. Lipid Profile: No results for input(s): CHOL, HDL, LDLCALC, TRIG, CHOLHDL, LDLDIRECT in the last 72 hours. Thyroid Function Tests: No results for input(s): TSH, T4TOTAL, FREET4, T3FREE, THYROIDAB in the last 72 hours. Anemia Panel: No results for input(s): VITAMINB12, FOLATE, FERRITIN, TIBC, IRON, RETICCTPCT in the last 72 hours. Urine analysis:    Component Value Date/Time   COLORURINE YELLOW 05/19/2019 [place] 05/19/2019 1913  LABSPEC 1.020 05/19/2019 1913   PHURINE 8.0 05/19/2019 1913   GLUCOSEU NEGATIVE 05/19/2019 1913   HGBUR NEGATIVE 05/19/2019 [place] 05/19/2019 [place] NEGATIVE 05/19/2019 [place] NEGATIVE 05/19/2019 1913   NITRITE NEGATIVE 05/19/2019 1913   LEUKOCYTESUR NEGATIVE 05/19/2019 1913   Recent Results (from the past 240 hour(s))  Culture, blood (routine x 2)     Status: None   Collection Time: 05/18/19  4:15 PM   Specimen: BLOOD  Result Value Ref Range Status   Specimen Description   Final    BLOOD RIGHT ANTECUBITAL Performed at [facility], [place] [address] [place], [place] [address]    Special Requests   Final    BOTTLES DRAWN AEROBIC AND ANAEROBIC Blood Culture adequate volume Performed at [place] [address] [place], [place] [address]    Culture   Final    NO GROWTH 5 DAYS Performed at [facility], [place]  [address] [place], [place] [address]    Report Status 05/23/2019 FINAL  Final  SARS CORONAVIRUS 2 (TAT 6-24 HRS) Nasopharyngeal Nasopharyngeal Swab     Status: None   Collection Time: 05/18/19  6:19 PM   Specimen: Nasopharyngeal Swab  Result Value Ref Range Status   SARS Coronavirus 2 NEGATIVE NEGATIVE Final    Comment: (NOTE) SARS-CoV-2 target nucleic acids are NOT DETECTED. The SARS-CoV-2 RNA is generally detectable in upper and lower respiratory specimens during the acute phase of infection. Negative results do not preclude SARS-CoV-2 infection, do not rule out co-infections with other pathogens, and should not be used as the sole basis for treatment or other patient management decisions. Negative results must be combined with clinical observations, patient history, and epidemiological information. The expected result is Negative. Fact Sheet for Patients: SugarRoll.be Fact Sheet for Healthcare Providers: https://www.woods-mathews.com/ This test is not yet approved or cleared by the [place] FDA and  has been authorized for detection and/or diagnosis of SARS-CoV-2 by FDA under an Emergency Use Authorization (EUA). This EUA will remain  in effect (meaning this test can be used) for the duration of the COVID-19 declaration under Section 56 4(b)(1) of the Act, 21 U.S.C. section 360bbb-3(b)(1), unless the authorization is terminated or revoked sooner. Performed at [facility], [place] [address] [place], [place] [address]   Culture, blood (routine x 2)     Status: None   Collection Time: 05/19/19  4:03 AM   Specimen: BLOOD LEFT HAND  Result Value Ref Range Status   Specimen Description   Final    BLOOD LEFT HAND Performed at [place] [address] [place], [place] [address]    Special Requests   Final    BOTTLES DRAWN AEROBIC ONLY Blood Culture results may not be optimal due to an inadequate volume of blood  received in culture bottles Performed at [place] [address], [place], [place] [address]    Culture   Final    NO GROWTH 5 DAYS Performed at [facility], [place] [address] [place], [place] [address]    Report Status 05/24/2019 FINAL  Final  MRSA PCR Screening     Status: None   Collection Time: 05/20/19  7:51 AM   Specimen: Nasal Mucosa; Nasopharyngeal  Result Value Ref Range Status   MRSA by PCR NEGATIVE NEGATIVE Final    Comment:        The GeneXpert MRSA Assay (FDA  approved for NASAL specimens only), is one component of a comprehensive MRSA colonization surveillance program. It is not intended to diagnose MRSA infection nor to guide or monitor treatment for MRSA infections. Performed at [facility], [place] [address], [place], [place] [address]    Radiology Studies: Dg Chest Port 1 View  Result Date: 05/23/2019 CLINICAL DATA:  Follow-up right lower lobe pneumonia. History of non-small cell lung cancer. EXAM: PORTABLE CHEST 1 VIEW COMPARISON:  05/18/2019 and 05/08/2019 FINDINGS: Persistent hazy and patchy densities in the right hilum and right lower lung. Findings have minimally changed since 05/18/2019. Slightly prominent interstitial densities in the right lung compared to the left. Left lung remains clear. Heart and mediastinum are within normal limits. Atherosclerotic calcifications at the aortic arch. IMPRESSION: No significant change in the hazy and patchy densities in the right lower lung and right hilar region. Findings could represent a combination of patient's known malignancy with underlying infection/inflammation. Electronically Signed   By: Markus Daft M.D.   On: 05/23/2019 08:51   Scheduled Meds: . dronabinol  2.5 mg Oral BID AC  . feeding supplement (ENSURE ENLIVE)  237 mL Oral BID BM  . feeding supplement (PRO-STAT SUGAR FREE 64)  30 mL Oral BID  . [START ON 05/25/2019] rivaroxaban  20 mg Oral Q supper  . sodium  chloride flush  10-40 mL Intracatheter Q12H   Continuous Infusions:    LOS: 6 days   Domenic Polite, MD [facility]  05/24/2019, 1:49 PM

## 2019-05-25 ENCOUNTER — Encounter (HOSPITAL_COMMUNITY): Payer: Self-pay | Admitting: Diagnostic Radiology

## 2019-05-25 ENCOUNTER — Inpatient Hospital Stay (HOSPITAL_COMMUNITY): Payer: Medicare HMO

## 2019-05-25 HISTORY — PX: IR GASTROSTOMY TUBE MOD SED: IMG625

## 2019-05-25 LAB — COMPREHENSIVE METABOLIC PANEL
ALT: 12 U/L (ref 0–44)
AST: 18 U/L (ref 15–41)
Albumin: 2.4 g/dL — ABNORMAL LOW (ref 3.5–5.0)
Alkaline Phosphatase: 75 U/L (ref 38–126)
Anion gap: 12 (ref 5–15)
BUN: 15 mg/dL (ref 8–23)
CO2: 23 mmol/L (ref 22–32)
Calcium: 9.4 mg/dL (ref 8.9–10.3)
Chloride: 105 mmol/L (ref 98–111)
Creatinine, Ser: 0.66 mg/dL (ref 0.61–1.24)
GFR calc Af Amer: >60 mL/min (ref >60)
GFR calc non Af Amer: >60 mL/min (ref >60)
Glucose, Bld: 102 mg/dL — ABNORMAL HIGH (ref 70–99)
Potassium: 4 mmol/L (ref 3.5–5.1)
Sodium: 140 mmol/L (ref 135–145)
Total Bilirubin: 0.6 mg/dL (ref 0.3–1.2)
Total Protein: 7.2 g/dL (ref 6.5–8.1)

## 2019-05-25 LAB — CBC WITH DIFFERENTIAL/PLATELET
Abs Immature Granulocytes: 0.02 10*3/uL (ref 0.00–0.07)
Basophils Absolute: 0 10*3/uL (ref 0.0–0.1)
Basophils Relative: 1 %
Eosinophils Absolute: 0.1 10*3/uL (ref 0.0–0.5)
Eosinophils Relative: 1 %
HCT: 34.5 % — ABNORMAL LOW (ref 39.0–52.0)
Hemoglobin: 10.8 g/dL — ABNORMAL LOW (ref 13.0–17.0)
Immature Granulocytes: 0 %
Lymphocytes Relative: 14 %
Lymphs Abs: 0.7 10*3/uL (ref 0.7–4.0)
MCH: 20.1 pg — ABNORMAL LOW (ref 26.0–34.0)
MCHC: 31.3 g/dL (ref 30.0–36.0)
MCV: 64.2 fL — ABNORMAL LOW (ref 80.0–100.0)
Monocytes Absolute: 0.9 10*3/uL (ref 0.1–1.0)
Monocytes Relative: 18 %
Neutro Abs: 3.2 10*3/uL (ref 1.7–7.7)
Neutrophils Relative %: 66 %
Platelets: 450 10*3/uL — ABNORMAL HIGH (ref 150–400)
RBC: 5.37 MIL/uL (ref 4.22–5.81)
RDW: 24.8 % — ABNORMAL HIGH (ref 11.5–15.5)
WBC: 4.9 10*3/uL (ref 4.0–10.5)
nRBC: 0.6 % — ABNORMAL HIGH (ref 0.0–0.2)

## 2019-05-25 LAB — PROTIME-INR
INR: 1.1 (ref 0.8–1.2)
Prothrombin Time: 14.4 seconds (ref 11.4–15.2)

## 2019-05-25 MED ORDER — LIDOCAINE HCL (PF) 1 % IJ SOLN
INTRAMUSCULAR | Status: AC | PRN
  Administered 2019-05-25: 10 mL

## 2019-05-25 MED ORDER — IOHEXOL 300 MG/ML  SOLN
50.0000 mL | Freq: Once | INTRAMUSCULAR | Status: AC | PRN
  Administered 2019-05-25: 12 mL

## 2019-05-25 MED ORDER — GLUCAGON HCL RDNA (DIAGNOSTIC) 1 MG IJ SOLR
INTRAMUSCULAR | Status: AC
  Filled 2019-05-25: qty 1

## 2019-05-25 MED ORDER — ACETAMINOPHEN 160 MG/5ML PO SOLN
650.0000 mg | Freq: Four times a day (QID) | ORAL | Status: DC | PRN
  Administered 2019-05-25 – 2019-05-29 (×5): 650 mg
  Filled 2019-05-25 (×6): qty 20.3

## 2019-05-25 MED ORDER — GLUCAGON HCL (RDNA) 1 MG IJ SOLR
INTRAMUSCULAR | Status: AC | PRN
  Administered 2019-05-25: .5 mg via INTRAVENOUS

## 2019-05-25 MED ORDER — RIVAROXABAN 20 MG PO TABS
20.0000 mg | ORAL_TABLET | Freq: Every day | ORAL | Status: DC
  Administered 2019-05-25: 20 mg via ORAL
  Filled 2019-05-25: qty 1

## 2019-05-25 MED ORDER — MIDAZOLAM HCL 2 MG/2ML IJ SOLN
INTRAMUSCULAR | Status: AC | PRN
  Administered 2019-05-25 (×2): 0.5 mg via INTRAVENOUS
  Administered 2019-05-25: 1 mg via INTRAVENOUS

## 2019-05-25 MED ORDER — LIDOCAINE HCL 1 % IJ SOLN
INTRAMUSCULAR | Status: AC
  Filled 2019-05-25: qty 20

## 2019-05-25 MED ORDER — ACETAMINOPHEN 325 MG PO TABS: 650.0000 mg | ORAL_TABLET | Freq: Four times a day (QID) | ORAL | Status: DC | PRN

## 2019-05-25 MED ORDER — FENTANYL CITRATE (PF) 100 MCG/2ML IJ SOLN
INTRAMUSCULAR | Status: AC | PRN
  Administered 2019-05-25: 50 ug via INTRAVENOUS

## 2019-05-25 MED ORDER — FENTANYL CITRATE (PF) 100 MCG/2ML IJ SOLN
INTRAMUSCULAR | Status: AC
  Filled 2019-05-25: qty 2

## 2019-05-25 MED ORDER — CEFAZOLIN SODIUM-DEXTROSE 2-4 GM/100ML-% IV SOLN
INTRAVENOUS | Status: AC
  Administered 2019-05-25: 2 g via INTRAVENOUS
  Filled 2019-05-25: qty 100

## 2019-05-25 MED ORDER — MIDAZOLAM HCL 2 MG/2ML IJ SOLN
INTRAMUSCULAR | Status: AC
  Filled 2019-05-25: qty 2

## 2019-05-25 MED ORDER — METOPROLOL TARTRATE 5 MG/5ML IV SOLN
2.5000 mg | Freq: Four times a day (QID) | INTRAVENOUS | Status: DC | PRN
  Administered 2019-05-25: 2.5 mg via INTRAVENOUS
  Filled 2019-05-25: qty 5

## 2019-05-25 NOTE — Procedures (Signed)
    Interventional Radiology Procedure:   Indications: Malnutrition  Procedure: G-tube placement  Findings: 20 Fr tube in stomach  Complications: None     EBL: less than 10 ml  Plan: May use for medications today and anticipate tube feeds tomorrow.   Shyan Scalisi R. Anselm Pancoast, MD  Pager: [phone]

## 2019-05-25 NOTE — Progress Notes (Signed)
PHARMACY NOTE -  [facility] has been assisting with dosing of Xarelto for NVAF/Hx VTE.  Dosage remains stable at 20 mg PO qSupper and need for further dosage adjustment appears unlikely at present given stable home dose; SCr WNL.  Pharmacy will sign off, following peripherally for dose adjustments. Please reconsult if a change in clinical status warrants re-evaluation of dosage.  Reuel Boom, PharmD, BCPS [phone] 05/25/2019, 5:50 PM

## 2019-05-25 NOTE — Progress Notes (Signed)
PT Cancellation Note  Patient Details Name: Richard [name] MRN: [id number] DOB: June 10, 1941   Cancelled Treatment:     Pt had G - tube placement today. Will try again tomorrow.   Excell Seltzer, [facility]

## 2019-05-25 NOTE — Progress Notes (Signed)
Hand-off report to [facility], pt daughter called to notify. Pt instable condition. SRp, RN

## 2019-05-25 NOTE — Plan of Care (Signed)
  Problem: Education: Goal: Knowledge of General Education information will improve Description: Including pain rating scale, medication(s)/side effects and non-pharmacologic comfort measures Outcome: Not Progressing   

## 2019-05-25 NOTE — Progress Notes (Signed)
Nutrition Follow-up  RD working remotely.   DOCUMENTATION CODES:   Underweight  INTERVENTION:  - TF recommendations: Osmolite 1.2 @ 25 ml/hr to advance by 10 ml every 12 hours to reach goal rate of 65 ml/hr with 30 ml prostat once/day.  - at goal rate, this regimen will provide 1972 kcal, 101 grams protein, and 1279 ml free water.   Monitor magnesium, potassium, and phosphorus daily for at least 3 days, MD to replete as needed, as pt is at risk for refeeding syndrome given likely severe malnutrition, very poor PO intakes for >1 week.    NUTRITION DIAGNOSIS:   Increased nutrient needs related to chronic illness, cancer and cancer related treatments, acute illness as evidenced by estimated needs. -ongoing  GOAL:   Patient will meet greater than or equal to 90% of their needs -unmet at this time  MONITOR:   TF tolerance, Labs, Weight trends, Skin  ASSESSMENT:   78 year old male with dementia, A. fib with RVR, HTN, hyperlipidemia, history of R common femoral DVT on 01/31/2019, and stage 3 NSCLC. He presented to the ED from home with poor oral intake and hypoxia. CXR in the EDD showed COPD changes with bibasilar atelectasis vs infiltrate (R much greater than L). COVID-19 test negative.  Patient has been NPO since midnight. PEG placed today around 0915 and patient remains NPO following procedure. Patient remains a/o to self only. Will place TF recommendations above. Will continue to monitor for plan of care moving forward.     Labs reviewed.  Medications reviewed; 2.5 mg marinol BID.     Diet Order:   Diet Order            Diet NPO time specified Except for: Sips with Meds  Diet effective midnight              EDUCATION NEEDS:   No education needs have been identified at this time  Skin:  Skin Assessment: Skin Integrity Issues: Skin Integrity Issues:: Stage I, Stage II Stage I: bilateral hips Stage II: L hip  Last BM:  12/6  Height:   Ht Readings from Last 1  Encounters:  05/19/19 6\' 2"  (1.88 m)    Weight:   Wt Readings from Last 1 Encounters:  05/19/19 60.9 kg    Ideal Body Weight:  86.4 kg  BMI:  Body mass index is 17.24 kg/m.  Estimated Nutritional Needs:   Kcal:  1900-2100 kcal  Protein:  95-105 grams  Fluid:  >/= 2 L/day      Richard Matin, MS, RD, LDN, [facility] Inpatient Clinical Dietitian Pager # [phone] After hours/weekend pager # [phone]

## 2019-05-25 NOTE — Progress Notes (Signed)
PROGRESS NOTE    Richard [name]   FWY:[id number]  DOB: 1940/09/23  DOA: 05/18/2019 PCP: Richard Perna, NP   Brief Narrative:  Richard [name] is a 78 year old male with dementia, A. fib with RVR, hypertension, hyperlipidemia, history of right common femoral DVT on 01/31/2019, stage III non-small cell lung cancer on chemo, presented from home with poor oral intake and hypoxia on ambulation when checked by his home health RN. Chest x-ray on admission showed COPD changes with bibasilar atelectasis, COVID-19 PCR was negative. Recent hospitalization last month for suspected sepsis from UTI. During this admission, pt was managed for HCAP, currently improved, but continues to have severe anorexia, very poor p.o. intake and severe malnutrition, multiple previous providers had discussions with daughter, Richard [name] was added, palliative care was consulted, daughter continued to insist on PEG tube placement. PEG tube placement done by IR on 05/25/19. Oncology also aware and NP following  Subjective: Saw pt after PEG placement, not able to have a meaningful conversation with me. Looked comfortable.  Assessment & Plan:   HCAP versus postobstructive pneumonia Currently afebrile with no leukocytosis Pt with right middle lobe obstructing mass Initially treated with IV vancomycin and cefepime on admission, subsequently vancomycin was discontinued after 48 hours and cefepime after 4 to 5 days SLP evaluation, to r/o aspiration Monitor closely  Non-small cell cancer-diagnosed in July 2020 Most recent CT was in August 2020 and revealed a spiculated perihilar mass with obstruction of the right middle lobe and pretracheal enlarged lymph nodes Pt has received 8 cycles of chemotherapy Dr. Julien Nordmann, continue outpt follow up  Severe protein-calorie malnutrition, severe anorexia, poor p.o. intake His daughter-Richard [name] had multiple discussions with my prior partners and continued to insist on feeding tube  placement Palliative team on board PEG tube placed per IR Dietician consulted for tube feedings  Paroxysmal A-fib HR now in the 120s (worsened by ?pain) Not on any rate controlling agent at home IV metoprolol prn Continue Xarelto  Dementia Remains pleasantly confused No behavioral problems at this time, haldol ordered as needed.   h/o PE in August 2020 Xarelto  Time spent in minutes:  48min DVT prophylaxis: Xarelto Code Status: Full code Family Communication: None at bedside Disposition Plan: Home soon after PEG placed, family resistant to SNF want to take home Consultants: Oncology, palliative MD  Procedures:   PEG tube placement    Antimicrobials:  Anti-infectives (From admission, onward)   Start     Dose/Rate Route Frequency Ordered Stop   05/25/19 0800  ceFAZolin (ANCEF) IVPB 2g/100 mL premix     2 g 200 mL/hr over 30 Minutes Intravenous To Radiology 05/24/19 1453 05/25/19 0906   05/19/19 1800  vancomycin (VANCOCIN) IVPB 1000 mg/200 mL premix  Status:  Discontinued     1,000 mg 200 mL/hr over 60 Minutes Intravenous Every 24 hours 05/19/19 1141 05/20/19 1124   05/19/19 0800  vancomycin (VANCOCIN) IVPB 750 mg/150 ml premix  Status:  Discontinued     750 mg 150 mL/hr over 60 Minutes Intravenous Every 12 hours 05/18/19 2025 05/19/19 1139   05/19/19 0200  ceFEPIme (MAXIPIME) 2 g in sodium chloride 0.9 % 100 mL IVPB  Status:  Discontinued     2 g 200 mL/hr over 30 Minutes Intravenous Every 8 hours 05/18/19 2025 05/22/19 1658   05/18/19 1815  vancomycin (VANCOCIN) 1,250 mg in sodium chloride 0.9 % 250 mL IVPB     1,250 mg 166.7 mL/hr over 90 Minutes Intravenous  Once 05/18/19 1806 05/18/19 2058  05/18/19 1815  piperacillin-tazobactam (ZOSYN) IVPB 3.375 g     3.375 g 100 mL/hr over 30 Minutes Intravenous  Once 05/18/19 1806 05/18/19 1921       Objective: Vitals:   05/25/19 0910 05/25/19 0915 05/25/19 1102 05/25/19 1140  BP: (!) 143/83 132/78 105/62 (!) 97/32   Pulse:   (!) 109 (!) 108  Resp: 16   17  Temp:    (!) 97.4 F (36.3 C)  TempSrc:    Oral  SpO2:   93% 94%  Weight:      Height:        Intake/Output Summary (Last 24 hours) at 05/25/2019 1647 Last data filed at 05/25/2019 1413 Gross per 24 hour  Intake 200 ml  Output --  Net 200 ml   Filed Weights   05/18/19 1450 05/19/19 0325  Weight: 63.6 kg 60.9 kg    Examination:  General: NAD, chronically ill-appearing, not conversant, cachetic    Cardiovascular: S1, S2 present  Respiratory: Bibasilar rhonchi  Abdomen: Soft, nontender, nondistended, bowel sounds present, PEG tube inplace  Musculoskeletal: No bilateral pedal edema noted  Skin: Normal  Psychiatry: Poor insight  Data Reviewed: I have personally reviewed following labs and imaging studies  CBC: Recent Labs  Lab 05/25/19 0403  WBC 4.9  NEUTROABS 3.2  HGB 10.8*  HCT 34.5*  MCV 64.2*  PLT 314*   Basic Metabolic Panel: Recent Labs  Lab 05/20/19 0400 05/25/19 0403  NA  --  140  K  --  4.0  CL  --  105  CO2  --  23  GLUCOSE  --  102*  BUN  --  15  CREATININE 0.58* 0.66  CALCIUM  --  9.4   GFR: Estimated Creatinine Clearance: 67.7 mL/min (by C-G formula based on SCr of 0.66 mg/dL). Liver Function Tests: Recent Labs  Lab 05/25/19 0403  AST 18  ALT 12  ALKPHOS 75  BILITOT 0.6  PROT 7.2  ALBUMIN 2.4*   No results for input(s): LIPASE, AMYLASE in the last 168 hours. No results for input(s): AMMONIA in the last 168 hours. Coagulation Profile: Recent Labs  Lab 05/25/19 0403  INR 1.1   Cardiac Enzymes: No results for input(s): CKTOTAL, CKMB, CKMBINDEX, TROPONINI in the last 168 hours. BNP (last 3 results) No results for input(s): PROBNP in the last 8760 hours. HbA1C: No results for input(s): HGBA1C in the last 72 hours. CBG: No results for input(s): GLUCAP in the last 168 hours. Lipid Profile: No results for input(s): CHOL, HDL, LDLCALC, TRIG, CHOLHDL, LDLDIRECT in the last 72  hours. Thyroid Function Tests: No results for input(s): TSH, T4TOTAL, FREET4, T3FREE, THYROIDAB in the last 72 hours. Anemia Panel: No results for input(s): VITAMINB12, FOLATE, FERRITIN, TIBC, IRON, RETICCTPCT in the last 72 hours. Urine analysis:    Component Value Date/Time   COLORURINE YELLOW 05/19/2019 1913   APPEARANCEUR CLEAR 05/19/2019 1913   LABSPEC 1.020 05/19/2019 1913   PHURINE 8.0 05/19/2019 1913   GLUCOSEU NEGATIVE 05/19/2019 [place] NEGATIVE 05/19/2019 [place] NEGATIVE 05/19/2019 [place] NEGATIVE 05/19/2019 1913   PROTEINUR NEGATIVE 05/19/2019 1913   NITRITE NEGATIVE 05/19/2019 1913   LEUKOCYTESUR NEGATIVE 05/19/2019 1913   Recent Results (from the past 240 hour(s))  Culture, blood (routine x 2)     Status: None   Collection Time: 05/18/19  4:15 PM   Specimen: BLOOD  Result Value Ref Range Status   Specimen Description   Final  BLOOD RIGHT ANTECUBITAL Performed at [facility], [place] [address] [place], [place] [address]    Special Requests   Final    BOTTLES DRAWN AEROBIC AND ANAEROBIC Blood Culture adequate volume Performed at [facility] [address], [place], [place] [address]    Culture   Final    NO GROWTH 5 DAYS Performed at [facility], [place] [address], [place], [place] [address]    Report Status 05/23/2019 FINAL  Final  SARS CORONAVIRUS 2 (TAT 6-24 HRS) Nasopharyngeal Nasopharyngeal Swab     Status: None   Collection Time: 05/18/19  6:19 PM   Specimen: Nasopharyngeal Swab  Result Value Ref Range Status   SARS Coronavirus 2 NEGATIVE NEGATIVE Final    Comment: (NOTE) SARS-CoV-2 target nucleic acids are NOT DETECTED. The SARS-CoV-2 RNA is generally detectable in upper and lower respiratory specimens during the acute phase of infection. Negative results do not preclude SARS-CoV-2 infection, do not rule out co-infections with other pathogens, and should not be used as the sole basis for  treatment or other patient management decisions. Negative results must be combined with clinical observations, patient history, and epidemiological information. The expected result is Negative. Fact Sheet for Patients: SugarRoll.be Fact Sheet for Healthcare Providers: https://www.woods-mathews.com/ This test is not yet approved or cleared by the [place] FDA and  has been authorized for detection and/or diagnosis of SARS-CoV-2 by FDA under an Emergency Use Authorization (EUA). This EUA will remain  in effect (meaning this test can be used) for the duration of the COVID-19 declaration under Section 56 4(b)(1) of the Act, 21 U.S.C. section 360bbb-3(b)(1), unless the authorization is terminated or revoked sooner. Performed at [facility], [place] [address], [place], [place] [address]   Culture, blood (routine x 2)     Status: None   Collection Time: 05/19/19  4:03 AM   Specimen: BLOOD LEFT HAND  Result Value Ref Range Status   Specimen Description   Final    BLOOD LEFT HAND Performed at [facility] [address] [place], [place] [address]    Special Requests   Final    BOTTLES DRAWN AEROBIC ONLY Blood Culture results may not be optimal due to an inadequate volume of blood received in culture bottles Performed at [facility] [address], [place], [place] [address]    Culture   Final    NO GROWTH 5 DAYS Performed at [facility], [place] [address] [place], [place] [address]    Report Status 05/24/2019 FINAL  Final  MRSA PCR Screening     Status: None   Collection Time: 05/20/19  7:51 AM   Specimen: Nasal Mucosa; Nasopharyngeal  Result Value Ref Range Status   MRSA by PCR NEGATIVE NEGATIVE Final    Comment:        The GeneXpert MRSA Assay (FDA approved for NASAL specimens only), is one component of a comprehensive MRSA colonization surveillance program. It is not intended to  diagnose MRSA infection nor to guide or monitor treatment for MRSA infections. Performed at [facility], [place] [address], [place], [place] [address]    Radiology Studies: Ir Gastrostomy Tube Mod Sed  Result Date: 05/25/2019 INDICATION: 78 year old with malnutrition. Request for gastrostomy tube placement. EXAM: PERCUTANEOUS GASTROSTOMY TUBE WITH FLUOROSCOPIC GUIDANCE Physician: Stephan Minister. Anselm Pancoast, MD MEDICATIONS: Ancef 2 g; Antibiotics were administered within 1 hour of the procedure. Glucagon 0.5 mg IV ANESTHESIA/SEDATION: Versed 2.0 mg  IV; Fentanyl 50 mcg IV Moderate Sedation Time:  18 minutes The patient was continuously monitored during the procedure by the interventional radiology nurse under my direct supervision. FLUOROSCOPY TIME:  Fluoroscopy Time: 6 minutes, 12 mGy COMPLICATIONS: None immediate. PROCEDURE: Informed consent was obtained for a percutaneous gastrostomy tube. The patient was placed on the interventional table. An orogastric tube was placed with fluoroscopic guidance. The anterior abdomen was prepped and draped in sterile fashion. Maximal barrier sterile technique was utilized including caps, mask, sterile gowns, sterile gloves, sterile drape, hand hygiene and skin antiseptic. Stomach was inflated with air through the orogastric tube. The skin and subcutaneous tissues were anesthetized with 1% lidocaine. A 17 gauge needle was directed into the distended stomach with fluoroscopic guidance. A wire was advanced into the stomach and a T-tact was deployed. A 9-French vascular sheath was placed and the orogastric tube was snared using a Gooseneck snare device. The orogastric tube and snare were pulled out of the patient's mouth. The snare device was connected to a 20-French gastrostomy tube. The snare device and gastrostomy tube were pulled through the patient's mouth and out the anterior abdominal wall. The gastrostomy tube was cut to an appropriate length. Contrast injection  through gastrostomy tube confirmed placement within the stomach. Fluoroscopic images were obtained for documentation. The gastrostomy tube was flushed with normal saline. IMPRESSION: Successful fluoroscopic guided percutaneous gastrostomy tube placement. Electronically Signed   By: Markus Daft M.D.   On: 05/25/2019 09:46   Scheduled Meds:  dronabinol  2.5 mg Oral BID AC   feeding supplement (ENSURE ENLIVE)  237 mL Oral BID BM   feeding supplement (PRO-STAT SUGAR FREE 64)  30 mL Oral BID   lidocaine       sodium chloride flush  10-40 mL Intracatheter Q12H   Continuous Infusions:    LOS: 7 days   Alma Friendly, MD [facility]  05/25/2019, 4:47 PM

## 2019-05-25 NOTE — Progress Notes (Signed)
Pt arrived on floor. Report received from [place], [place]. Pt with mittens to protect lines and tubes. MD paged for nutrition orders per nutritional management recommendations.

## 2019-05-26 ENCOUNTER — Inpatient Hospital Stay: Payer: Medicare HMO | Attending: Internal Medicine

## 2019-05-26 ENCOUNTER — Inpatient Hospital Stay (HOSPITAL_COMMUNITY): Payer: Medicare HMO

## 2019-05-26 ENCOUNTER — Ambulatory Visit (HOSPITAL_COMMUNITY): Payer: Medicare HMO

## 2019-05-26 LAB — MAGNESIUM
Magnesium: 1.9 mg/dL (ref 1.7–2.4)
Magnesium: 2.1 mg/dL (ref 1.7–2.4)

## 2019-05-26 LAB — PHOSPHORUS
Phosphorus: 3.3 mg/dL (ref 2.5–4.6)
Phosphorus: 3.4 mg/dL (ref 2.5–4.6)

## 2019-05-26 MED ORDER — RIVAROXABAN 20 MG PO TABS
20.0000 mg | ORAL_TABLET | Freq: Every day | ORAL | Status: DC
  Administered 2019-05-26 – 2019-05-28 (×3): 20 mg
  Filled 2019-05-26 (×4): qty 1

## 2019-05-26 MED ORDER — PRO-STAT SUGAR FREE PO LIQD
30.0000 mL | Freq: Every day | ORAL | Status: DC
  Administered 2019-05-27 – 2019-05-30 (×4): 30 mL
  Filled 2019-05-26 (×4): qty 30

## 2019-05-26 MED ORDER — IOHEXOL 300 MG/ML  SOLN
75.0000 mL | Freq: Once | INTRAMUSCULAR | Status: AC | PRN
  Administered 2019-05-26: 75 mL via INTRAVENOUS

## 2019-05-26 MED ORDER — ENSURE ENLIVE PO LIQD
237.0000 mL | Freq: Two times a day (BID) | ORAL | Status: DC
  Administered 2019-05-26: 237 mL

## 2019-05-26 MED ORDER — OSMOLITE 1.2 CAL PO LIQD
1000.0000 mL | ORAL | Status: DC
  Administered 2019-05-26 – 2019-05-29 (×5): 1000 mL
  Filled 2019-05-26: qty 1000

## 2019-05-26 MED ORDER — PRO-STAT SUGAR FREE PO LIQD
30.0000 mL | Freq: Two times a day (BID) | ORAL | Status: DC
  Administered 2019-05-26: 30 mL
  Filled 2019-05-26: qty 30

## 2019-05-26 MED ORDER — VITAL HIGH PROTEIN PO LIQD
1000.0000 mL | ORAL | Status: DC
  Administered 2019-05-27 – 2019-05-28 (×2): 1000 mL
  Filled 2019-05-26 (×3): qty 1000

## 2019-05-26 MED ORDER — SODIUM CHLORIDE (PF) 0.9 % IJ SOLN
INTRAMUSCULAR | Status: AC
  Filled 2019-05-26: qty 50

## 2019-05-26 MED ORDER — FREE WATER
100.0000 mL | Freq: Four times a day (QID) | Status: DC
  Administered 2019-05-26 – 2019-05-30 (×15): 100 mL

## 2019-05-26 NOTE — Progress Notes (Signed)
Referring Physician(s): Murlean Iba  Supervising Physician: Corrie Mckusick  Patient Status:  [facility] - In-pt  Chief Complaint: None  Subjective:  History of failure to thrive (dysphagia, malnutrition) s/p percutaneous gastrostomy tube placement in IR 05/25/2019 by Dr. Anselm Pancoast. Patient laying in bed resting comfortably. He opens eyes to voice. Attempts to converse however not able to hold a meaningful conversation with me (appears a bit confused- hx dementia). Gastrostomy tube site c/d/i.   Allergies: Patient has no known allergies.  Medications: Prior to Admission medications   Medication Sig Start Date End Date Taking? Authorizing Provider  ferrous sulfate 325 (65 FE) MG tablet Take 325 mg by mouth 2 (two) times daily with a meal.   Yes [provider]  Multiple Vitamin (MULTIVITAMIN) tablet Take 1 tablet by mouth daily.   Yes [provider]  Omega-3 Fatty Acids (FISH OIL) 1200 MG CAPS Take 1,200 mg by mouth daily.   Yes [provider]  prochlorperazine (COMPAZINE) 10 MG tablet Take 1 tablet (10 mg total) by mouth every 6 (six) hours as needed for nausea or vomiting. 01/12/19  Yes Heilingoetter, Cassandra L, PA-C  rivaroxaban (XARELTO) 20 MG TABS tablet Take 1 tablet (20 mg total) by mouth daily with supper. 04/20/19  Yes Curt Bears, MD     Vital Signs: BP 119/73 (BP Location: Right Arm)    Pulse (!) 101    Temp 98.3 F (36.8 C) (Axillary)    Resp 20    Ht 6\' 2"  (1.88 m)    Wt 134 lb 4.2 oz (60.9 kg)    SpO2 97%    BMI 17.24 kg/m   Physical Exam Vitals and nursing note reviewed.  Constitutional:      General: He is not in acute distress. Pulmonary:     Effort: Pulmonary effort is normal. No respiratory distress.  Abdominal:     Comments: Gastrostomy tube site intact without tenderness, erythema, drainage, or active bleeding.  Skin:    General: Skin is warm and dry.     Imaging: IR GASTROSTOMY TUBE MOD SED  Result Date:  05/25/2019 INDICATION: 78 year old with malnutrition. Request for gastrostomy tube placement. EXAM: PERCUTANEOUS GASTROSTOMY TUBE WITH FLUOROSCOPIC GUIDANCE Physician: Stephan Minister. Anselm Pancoast, MD MEDICATIONS: Ancef 2 g; Antibiotics were administered within 1 hour of the procedure. Glucagon 0.5 mg IV ANESTHESIA/SEDATION: Versed 2.0 mg IV; Fentanyl 50 mcg IV Moderate Sedation Time:  18 minutes The patient was continuously monitored during the procedure by the interventional radiology nurse under my direct supervision. FLUOROSCOPY TIME:  Fluoroscopy Time: 6 minutes, 12 mGy COMPLICATIONS: None immediate. PROCEDURE: Informed consent was obtained for a percutaneous gastrostomy tube. The patient was placed on the interventional table. An orogastric tube was placed with fluoroscopic guidance. The anterior abdomen was prepped and draped in sterile fashion. Maximal barrier sterile technique was utilized including caps, mask, sterile gowns, sterile gloves, sterile drape, hand hygiene and skin antiseptic. Stomach was inflated with air through the orogastric tube. The skin and subcutaneous tissues were anesthetized with 1% lidocaine. A 17 gauge needle was directed into the distended stomach with fluoroscopic guidance. A wire was advanced into the stomach and a T-tact was deployed. A 9-French vascular sheath was placed and the orogastric tube was snared using a Gooseneck snare device. The orogastric tube and snare were pulled out of the patient's mouth. The snare device was connected to a 20-French gastrostomy tube. The snare device and gastrostomy tube were pulled through the patient's mouth and out the anterior  abdominal wall. The gastrostomy tube was cut to an appropriate length. Contrast injection through gastrostomy tube confirmed placement within the stomach. Fluoroscopic images were obtained for documentation. The gastrostomy tube was flushed with normal saline. IMPRESSION: Successful fluoroscopic guided percutaneous gastrostomy tube  placement. Electronically Signed   By: Markus Daft M.D.   On: 05/25/2019 09:46   DG CHEST PORT 1 VIEW  Result Date: 05/23/2019 CLINICAL DATA:  Follow-up right lower lobe pneumonia. History of non-small cell lung cancer. EXAM: PORTABLE CHEST 1 VIEW COMPARISON:  05/18/2019 and 05/08/2019 FINDINGS: Persistent hazy and patchy densities in the right hilum and right lower lung. Findings have minimally changed since 05/18/2019. Slightly prominent interstitial densities in the right lung compared to the left. Left lung remains clear. Heart and mediastinum are within normal limits. Atherosclerotic calcifications at the aortic arch. IMPRESSION: No significant change in the hazy and patchy densities in the right lower lung and right hilar region. Findings could represent a combination of patient's known malignancy with underlying infection/inflammation. Electronically Signed   By: Markus Daft M.D.   On: 05/23/2019 08:51    Labs:  CBC: Recent Labs    05/02/19 0825 05/08/19 1538 05/18/19 1615 05/25/19 0403  WBC 4.5 8.9 6.4 4.9  HGB 11.6* 11.1* 11.3* 10.8*  HCT 35.7* 35.4* 35.8* 34.5*  PLT 336 382 394 450*    COAGS: Recent Labs    12/16/18 0552 01/30/19 1542 05/08/19 1538 05/25/19 0403  INR 1.2 1.2 1.8* 1.1  APTT 34  --  45*  --     BMP: Recent Labs    05/02/19 0825 05/08/19 1740 05/09/19 0431 05/18/19 1615 05/20/19 0400 05/25/19 0403  NA 138 138  --  141  --  140  K 4.9 4.8  --  4.3  --  4.0  CL 102 107  --  102  --  105  CO2 23 22  --  28  --  23  GLUCOSE 121* 111*  --  132*  --  102*  BUN 11 21  --  14  --  15  CALCIUM 9.4 8.8*  --  9.5  --  9.4  CREATININE 0.78 0.87 0.58* 0.71 0.58* 0.66  GFRNONAA >60 >60 >60 >60 >60 >60  GFRAA >60 >60 >60 >60 >60 >60    LIVER FUNCTION TESTS: Recent Labs    05/02/19 0825 05/08/19 1740 05/18/19 1615 05/25/19 0403  BILITOT 0.3 0.5 0.6 0.6  AST 19 28 19 18   ALT 18 22 14 12   ALKPHOS 116 81 82 75  PROT 7.0 6.4* 7.5 7.2  ALBUMIN 3.1*  3.0* 3.0* 2.4*    Assessment and Plan:  History of failure to thrive (dysphagia, malnutrition) s/p percutaneous gastrostomy tube placement in IR 05/25/2019 by Dr. Anselm Pancoast. Gastrostomy site stable- tube is ready for use. Further plans per TRH/oncology/orthopedic surgery- appreciate and agree with management. Please call IR with questions/concerns.   Electronically Signed: Earley Abide, PA-C 05/26/2019, 10:39 AM   I spent a total of 15 Minutes at the the patient's bedside AND on the patient's hospital floor or unit, greater than 50% of which was counseling/coordinating care for dysphagia and malnutrition s/p gastrostomy tube placement.

## 2019-05-26 NOTE — Progress Notes (Signed)
Brief oncology note:  Mr. Richard [name] remains hospitalized.  He is status post PEG tube placement.  The patient was due for restaging CT scan of the chest with contrast today as an outpatient.  I have ordered his CT scan to done as an inpatient.  He is currently scheduled to follow-up with Dr. Julien Nordmann on 05/30/2019 to discuss these results.  We will keep this appointment as scheduled.  However, if patient remains hospitalized, we can certainly delay this appointment.  Richard Bussing, DNP, AGPCNP-BC, AOCNP

## 2019-05-26 NOTE — Evaluation (Signed)
SLP Cancellation Note  Patient Details Name: Richard [name] MRN: [id number] DOB: 07/01/1940   Cancelled treatment:       Reason Eval/Treat Not Completed: Other (comment)(pt given Haldol by RN to prep for imaging, pt is s/p PEG, will continue efforts)   Macario Golds 05/26/2019, 11:46 AM   Luanna Salk, MS [facility] SLP Acute Rehab Services Pager [phone] Office [phone]

## 2019-05-26 NOTE — Progress Notes (Signed)
NT alerted writer that Pt refusing to allow for CBG to be obtained after multiple attempts, Pt becoming agitated and aggressive.

## 2019-05-26 NOTE — Progress Notes (Signed)
PROGRESS NOTE    Richard [name]   BTD:[id number]  DOB: Jul 03, 1940  DOA: 05/18/2019 PCP: [name], NP   Brief Narrative:  Richard [name] is a 78 year old male with dementia, A. fib with RVR, hypertension, hyperlipidemia, history of right common femoral DVT on 01/31/2019, stage III non-small cell lung cancer on chemo, presented from home with poor oral intake and hypoxia on ambulation when checked by his home health RN. Chest x-ray on admission showed COPD changes with bibasilar atelectasis, COVID-19 PCR was negative. Recent hospitalization last month for suspected sepsis from UTI. During this admission, pt was managed for HCAP, currently improved, but continues to have severe anorexia, very poor p.o. intake and severe malnutrition, multiple previous providers had discussions with daughter, Richard [name] was added, palliative care was consulted, daughter continued to insist on PEG tube placement. PEG tube placement done by IR on 05/25/19. Oncology also aware and NP following  Subjective: Today, patient reported he is doing okay, unable to have a meaningful conversation.  Assessment & Plan:   HCAP versus postobstructive pneumonia Currently afebrile with no leukocytosis Pt with right middle lobe obstructing mass Initially treated with IV vancomycin and cefepime on admission, subsequently vancomycin was discontinued after 48 hours and cefepime after 4 to 5 days Monitor closely  Non-small cell cancer-diagnosed in July 2020 Most recent CT was in August 2020 and revealed a spiculated perihilar mass with obstruction of the right middle lobe and pretracheal enlarged lymph nodes Pt has received 8 cycles of chemotherapy Dr. Julien Nordmann, continue outpt follow up  Severe protein-calorie malnutrition, severe anorexia, poor p.o. intake His daughter-[name] Haselton had multiple discussions with my prior partners and continued to insist on feeding tube placement Palliative team on board PEG tube placed per  IR Dietician consulted for tube feedings, started 05/26/19  Paroxysmal A-fib Heart rate somewhat controlled Not on any rate controlling agent at home IV metoprolol prn Continue Xarelto  Dementia Remains pleasantly confused No behavioral problems at this time, haldol ordered as needed.   h/o PE in August 2020 Xarelto  Time spent in minutes:  46min DVT prophylaxis: Xarelto Code Status: Full code Family Communication: None at bedside Disposition Plan: Home soon after PEG placed, family resistant to SNF want to take home Consultants: Oncology, palliative MD  Procedures:   PEG tube placement    Antimicrobials:  Anti-infectives (From admission, onward)   Start     Dose/Rate Route Frequency Ordered Stop   05/25/19 0800  ceFAZolin (ANCEF) IVPB 2g/100 mL premix     2 g 200 mL/hr over 30 Minutes Intravenous To Radiology 05/24/19 1453 05/25/19 0906   05/19/19 1800  vancomycin (VANCOCIN) IVPB 1000 mg/200 mL premix  Status:  Discontinued     1,000 mg 200 mL/hr over 60 Minutes Intravenous Every 24 hours 05/19/19 1141 05/20/19 1124   05/19/19 0800  vancomycin (VANCOCIN) IVPB 750 mg/150 ml premix  Status:  Discontinued     750 mg 150 mL/hr over 60 Minutes Intravenous Every 12 hours 05/18/19 2025 05/19/19 1139   05/19/19 0200  ceFEPIme (MAXIPIME) 2 g in sodium chloride 0.9 % 100 mL IVPB  Status:  Discontinued     2 g 200 mL/hr over 30 Minutes Intravenous Every 8 hours 05/18/19 2025 05/22/19 1658   05/18/19 1815  vancomycin (VANCOCIN) 1,250 mg in sodium chloride 0.9 % 250 mL IVPB     1,250 mg 166.7 mL/hr over 90 Minutes Intravenous  Once 05/18/19 1806 05/18/19 2058   05/18/19 1815  piperacillin-tazobactam (ZOSYN) IVPB 3.375 g  3.375 g 100 mL/hr over 30 Minutes Intravenous  Once 05/18/19 1806 05/18/19 1921       Objective: Vitals:   05/25/19 1823 05/25/19 2000 05/26/19 0625 05/26/19 1341  BP:  100/62 119/73 (!) 102/58  Pulse: (!) 117 (!) 107 (!) 101 (!) 59  Resp:   20 15   Temp:  98.1 F (36.7 C) 98.3 F (36.8 C) 97.6 F (36.4 C)  TempSrc:  Axillary Axillary Oral  SpO2: 96% 97%  (!) 79%  Weight:      Height:        Intake/Output Summary (Last 24 hours) at 05/26/2019 1535 Last data filed at 05/25/2019 2143 Gross per 24 hour  Intake 20 ml  Output 200 ml  Net -180 ml   Filed Weights   05/18/19 1450 05/19/19 0325  Weight: 63.6 kg 60.9 kg    Examination:  General: NAD, chronically ill-appearing, cachectic  Cardiovascular: S1, S2 present  Respiratory:  Bibasilar rhonchi  Abdomen: Soft, nontender, nondistended, bowel sounds present, G-tube in place  Musculoskeletal: No bilateral pedal edema noted  Skin: Normal  Psychiatry:  Poor insight   Data Reviewed: I have personally reviewed following labs and imaging studies  CBC: Recent Labs  Lab 05/25/19 0403  WBC 4.9  NEUTROABS 3.2  HGB 10.8*  HCT 34.5*  MCV 64.2*  PLT 932*   Basic Metabolic Panel: Recent Labs  Lab 05/20/19 0400 05/25/19 0403 05/26/19 1256  NA  --  140  --   K  --  4.0  --   CL  --  105  --   CO2  --  23  --   GLUCOSE  --  102*  --   BUN  --  15  --   CREATININE 0.58* 0.66  --   CALCIUM  --  9.4  --   MG  --   --  1.9  PHOS  --   --  3.3   GFR: Estimated Creatinine Clearance: 67.7 mL/min (by C-G formula based on SCr of 0.66 mg/dL). Liver Function Tests: Recent Labs  Lab 05/25/19 0403  AST 18  ALT 12  ALKPHOS 75  BILITOT 0.6  PROT 7.2  ALBUMIN 2.4*   No results for input(s): LIPASE, AMYLASE in the last 168 hours. No results for input(s): AMMONIA in the last 168 hours. Coagulation Profile: Recent Labs  Lab 05/25/19 0403  INR 1.1   Cardiac Enzymes: No results for input(s): CKTOTAL, CKMB, CKMBINDEX, TROPONINI in the last 168 hours. BNP (last 3 results) No results for input(s): PROBNP in the last 8760 hours. HbA1C: No results for input(s): HGBA1C in the last 72 hours. CBG: No results for input(s): GLUCAP in the last 168 hours. Lipid  Profile: No results for input(s): CHOL, HDL, LDLCALC, TRIG, CHOLHDL, LDLDIRECT in the last 72 hours. Thyroid Function Tests: No results for input(s): TSH, T4TOTAL, FREET4, T3FREE, THYROIDAB in the last 72 hours. Anemia Panel: No results for input(s): VITAMINB12, FOLATE, FERRITIN, TIBC, IRON, RETICCTPCT in the last 72 hours. Urine analysis:    Component Value Date/Time   COLORURINE YELLOW 05/19/2019 1913   APPEARANCEUR CLEAR 05/19/2019 1913   LABSPEC 1.020 05/19/2019 1913   PHURINE 8.0 05/19/2019 1913   GLUCOSEU NEGATIVE 05/19/2019 1913   HGBUR NEGATIVE 05/19/2019 [place] NEGATIVE 05/19/2019 [place] NEGATIVE 05/19/2019 1913   PROTEINUR NEGATIVE 05/19/2019 1913   NITRITE NEGATIVE 05/19/2019 1913   LEUKOCYTESUR NEGATIVE 05/19/2019 1913   Recent Results (from the past 240 hour(s))  Culture, blood (routine x 2)     Status: None   Collection Time: 05/18/19  4:15 PM   Specimen: BLOOD  Result Value Ref Range Status   Specimen Description   Final    BLOOD RIGHT ANTECUBITAL Performed at [facility], [place] [address], [place], [place] [address]    Special Requests   Final    BOTTLES DRAWN AEROBIC AND ANAEROBIC Blood Culture adequate volume Performed at [facility] [address], [place], [place] [address]    Culture   Final    NO GROWTH 5 DAYS Performed at [facility], [place] [address] [place], [place] [address]    Report Status 05/23/2019 FINAL  Final  SARS CORONAVIRUS 2 (TAT 6-24 HRS) Nasopharyngeal Nasopharyngeal Swab     Status: None   Collection Time: 05/18/19  6:19 PM   Specimen: Nasopharyngeal Swab  Result Value Ref Range Status   SARS Coronavirus 2 NEGATIVE NEGATIVE Final    Comment: (NOTE) SARS-CoV-2 target nucleic acids are NOT DETECTED. The SARS-CoV-2 RNA is generally detectable in upper and lower respiratory specimens during the acute phase of infection. Negative results do not preclude SARS-CoV-2 infection, do not  rule out co-infections with other pathogens, and should not be used as the sole basis for treatment or other patient management decisions. Negative results must be combined with clinical observations, patient history, and epidemiological information. The expected result is Negative. Fact Sheet for Patients: SugarRoll.be Fact Sheet for Healthcare Providers: https://www.woods-mathews.com/ This test is not yet approved or cleared by the [place] FDA and  has been authorized for detection and/or diagnosis of SARS-CoV-2 by FDA under an Emergency Use Authorization (EUA). This EUA will remain  in effect (meaning this test can be used) for the duration of the COVID-19 declaration under Section 56 4(b)(1) of the Act, 21 U.S.C. section 360bbb-3(b)(1), unless the authorization is terminated or revoked sooner. Performed at [facility], [place] [address], [place], [place] [address]   Culture, blood (routine x 2)     Status: None   Collection Time: 05/19/19  4:03 AM   Specimen: BLOOD LEFT HAND  Result Value Ref Range Status   Specimen Description   Final    BLOOD LEFT HAND Performed at [facility] [address], [place], [place] [address]    Special Requests   Final    BOTTLES DRAWN AEROBIC ONLY Blood Culture results may not be optimal due to an inadequate volume of blood received in culture bottles Performed at [facility] [address], [place], [place] [address]    Culture   Final    NO GROWTH 5 DAYS Performed at [facility], [place] [address], [place], [place] [address]    Report Status 05/24/2019 FINAL  Final  MRSA PCR Screening     Status: None   Collection Time: 05/20/19  7:51 AM   Specimen: Nasal Mucosa; Nasopharyngeal  Result Value Ref Range Status   MRSA by PCR NEGATIVE NEGATIVE Final    Comment:        The GeneXpert MRSA Assay (FDA approved for NASAL specimens only), is one  component of a comprehensive MRSA colonization surveillance program. It is not intended to diagnose MRSA infection nor to guide or monitor treatment for MRSA infections. Performed at [facility], [place] [address] [place], [place] [address]    Radiology Studies: CT CHEST W CONTRAST  Result Date: 05/26/2019 CLINICAL DATA:  Restaging  lung cancer. Status post chemotherapy and radiation therapy. EXAM: CT CHEST WITH CONTRAST TECHNIQUE: Multidetector CT imaging of the chest was performed during intravenous contrast administration. CONTRAST:  40mL OMNIPAQUE IOHEXOL 300 MG/ML  SOLN COMPARISON:  02/05/2019 FINDINGS: Cardiovascular: The heart is normal in size. No pericardial effusion. Stable tortuosity, ectasia and calcification of the thoracic aorta. No dissection. Stable coronary artery calcifications and probable stents. Mild pulmonary artery enlargement suggesting pulmonary hypertension. Mediastinum/Nodes: No discrete right hilar adenopathy. Stable small scattered mediastinal lymph nodes. 7 mm precarinal lymph node is unchanged. 8 mm AP window node is stable. No subcarinal adenopathy. Lungs/Pleura: The large necrotic right hilar mass is now a largely cystic/cavitary area with minimal residual soft tissue density laterally and posteriorly. This would suggest a good response to treatment. There is persistent complete right middle lobe atelectasis and bronchiectasis. Stable underlying severe changes of pulmonary emphysema with extensive pulmonary scarring changes. 6 mm right upper lobe pulmonary nodule on image 47/7 probably obscured on the prior study by a pleural effusion. 6.5 mm nodular lesion in the right upper lobe on image number 74/7. I do not see this for certain on the prior study but may have been obscured. 4 mm left lower lobe pulmonary nodule on image 112/7, unchanged. Resolution of pleural effusions seen on the prior study. Upper Abdomen: No significant upper abdominal findings.  No hepatic or adrenal gland lesions are identified. Musculoskeletal: No chest wall mass, supraclavicular or axillary adenopathy. The thyroid gland appears normal. Moderately heterogeneous appearance of the bone marrow without discrete lesions. IMPRESSION: 1. The large right hilar lesion is now a thin walled cavitary lesion suggesting a good response to treatment. 2. Stable mediastinal and hilar lymph nodes. 3. Persistent complete right middle lobe atelectasis and bronchiectasis. 4. Three pulmonary lesions as detailed above. Recommend continued surveillance. 5. Severe emphysematous changes and pulmonary scarring. 6. No findings for upper abdominal metastatic disease. Aortic Atherosclerosis (ICD10-I70.0) and Emphysema (ICD10-J43.9). Electronically Signed   By: Marijo Sanes M.D.   On: 05/26/2019 13:29   IR GASTROSTOMY TUBE MOD SED  Result Date: 05/25/2019 INDICATION: 78 year old with malnutrition. Request for gastrostomy tube placement. EXAM: PERCUTANEOUS GASTROSTOMY TUBE WITH FLUOROSCOPIC GUIDANCE Physician: Stephan Minister. Anselm Pancoast, MD MEDICATIONS: Ancef 2 g; Antibiotics were administered within 1 hour of the procedure. Glucagon 0.5 mg IV ANESTHESIA/SEDATION: Versed 2.0 mg IV; Fentanyl 50 mcg IV Moderate Sedation Time:  18 minutes The patient was continuously monitored during the procedure by the interventional radiology nurse under my direct supervision. FLUOROSCOPY TIME:  Fluoroscopy Time: 6 minutes, 12 mGy COMPLICATIONS: None immediate. PROCEDURE: Informed consent was obtained for a percutaneous gastrostomy tube. The patient was placed on the interventional table. An orogastric tube was placed with fluoroscopic guidance. The anterior abdomen was prepped and draped in sterile fashion. Maximal barrier sterile technique was utilized including caps, mask, sterile gowns, sterile gloves, sterile drape, hand hygiene and skin antiseptic. Stomach was inflated with air through the orogastric tube. The skin and subcutaneous tissues  were anesthetized with 1% lidocaine. A 17 gauge needle was directed into the distended stomach with fluoroscopic guidance. A wire was advanced into the stomach and a T-tact was deployed. A 9-French vascular sheath was placed and the orogastric tube was snared using a Gooseneck snare device. The orogastric tube and snare were pulled out of the patient's mouth. The snare device was connected to a 20-French gastrostomy tube. The snare device and gastrostomy tube were pulled through the patient's mouth and out the anterior abdominal wall. The gastrostomy tube was  cut to an appropriate length. Contrast injection through gastrostomy tube confirmed placement within the stomach. Fluoroscopic images were obtained for documentation. The gastrostomy tube was flushed with normal saline. IMPRESSION: Successful fluoroscopic guided percutaneous gastrostomy tube placement. Electronically Signed   By: Markus Daft M.D.   On: 05/25/2019 09:46   Scheduled Meds: . dronabinol  2.5 mg Oral BID AC  . [START ON 05/27/2019] feeding supplement (PRO-STAT SUGAR FREE 64)  30 mL Per Tube Daily  . feeding supplement (VITAL HIGH PROTEIN)  1,000 mL Per Tube Q24H  . free water  100 mL Per Tube Q6H  . rivaroxaban  20 mg Per Tube Q supper  . sodium chloride (PF)      . sodium chloride flush  10-40 mL Intracatheter Q12H   Continuous Infusions: . feeding supplement (OSMOLITE 1.2 CAL) 1,000 mL (05/26/19 1350)     LOS: 8 days   Alma Friendly, MD [facility]  05/26/2019, 3:35 PM

## 2019-05-26 NOTE — Progress Notes (Signed)
PT Cancellation Note  Patient Details Name: Richard [name] MRN: [id number] DOB: 1941-04-08   Cancelled Treatment:     PT attempted but deferred - pt states "I'll move when I'm good and ready" and becoming increasingly agitated and cursing with attempts to encourage participation.  Will follow.  Debe Coder PT Acute Rehabilitation Services Pager [phone] Office [phone]    [facility] 05/26/2019, 4:56 PM

## 2019-05-26 NOTE — Progress Notes (Signed)
Nutrition Follow-up  DOCUMENTATION CODES:   Underweight  INTERVENTION:  - will order Osmolite 1.2 @ 25 ml/hr to advance by 10 ml every 12 hours to reach goal rate of 65 ml/hr with 30 ml prostat once/day and 100 ml free water QID. - at goal rate, this regimen will provide 1972 kcal, 101 grams protein, and 1679 ml free water.   Monitor magnesium, potassium, and phosphorus daily for at least 3 days, MD to replete as needed, as pt is at risk for refeeding syndrome given severe malnutrition, very poor intakes for prolonged period.  * weigh patient today.    NUTRITION DIAGNOSIS:   Increased nutrient needs related to chronic illness, cancer and cancer related treatments, acute illness as evidenced by estimated needs. -ongoing  GOAL:   Patient will meet greater than or equal to 90% of their needs -unmet at this time  MONITOR:   TF tolerance, Labs, Weight trends, Skin  REASON FOR ASSESSMENT:   Consult Enteral/tube feeding initiation and management  ASSESSMENT:   78 year old male with dementia, A. fib with RVR, HTN, hyperlipidemia, history of R common femoral DVT on 01/31/2019, and stage 3 NSCLC. He presented to the ED from home with poor oral intake and hypoxia. CXR in the EDD showed COPD changes with bibasilar atelectasis vs infiltrate (R much greater than L). COVID-19 test negative.  PEG placed by IR yesterday and consult received for TF. Will order TF as outlined above. Will start with continuous TF as patient has high risk of refeeding and likely some degree of stomach atrophy d/t prolonged period of very poor nutrition intake. Would like to transition to bolus TF prior to d/c. Patient has not been weighed since admission. SLP was unable to assess patient earlier today.   Per notes: - plan for re-staging CT chest with contrast today--scheduled for follow-up with Dr. Julien Nordmann on 12/14 - NSCLC dx 12/2018 - severe protein-calorie malnutrition with severe anorexia and poor PO intakes -  dementia--pleasantly confused    Labs reviewed. Medications reviewed; 2.5 mg marinol BID.    Diet Order:   Diet Order            Diet NPO time specified Except for: Ice Chips  Diet effective now              EDUCATION NEEDS:   No education needs have been identified at this time  Skin:  Skin Assessment: Skin Integrity Issues: Skin Integrity Issues:: Stage I, Stage II Stage I: bilateral hips Stage II: L hip  Last BM:  12/6  Height:   Ht Readings from Last 1 Encounters:  05/19/19 6\' 2"  (1.88 m)    Weight:   Wt Readings from Last 1 Encounters:  05/19/19 60.9 kg    Ideal Body Weight:  86.4 kg  BMI:  Body mass index is 17.24 kg/m.  Estimated Nutritional Needs:   Kcal:  1900-2100 kcal  Protein:  95-105 grams  Fluid:  >/= 2 L/day     Jarome Matin, MS, RD, LDN, [facility] Inpatient Clinical Dietitian Pager # [phone] After hours/weekend pager # [phone]

## 2019-05-27 LAB — CBC WITH DIFFERENTIAL/PLATELET
Abs Immature Granulocytes: 0.01 10*3/uL (ref 0.00–0.07)
Basophils Absolute: 0 10*3/uL (ref 0.0–0.1)
Basophils Relative: 0 %
Eosinophils Absolute: 0 10*3/uL (ref 0.0–0.5)
Eosinophils Relative: 1 %
HCT: 35.3 % — ABNORMAL LOW (ref 39.0–52.0)
Hemoglobin: 11 g/dL — ABNORMAL LOW (ref 13.0–17.0)
Immature Granulocytes: 0 %
Lymphocytes Relative: 16 %
Lymphs Abs: 0.8 10*3/uL (ref 0.7–4.0)
MCH: 20 pg — ABNORMAL LOW (ref 26.0–34.0)
MCHC: 31.2 g/dL (ref 30.0–36.0)
MCV: 64.2 fL — ABNORMAL LOW (ref 80.0–100.0)
Monocytes Absolute: 0.6 10*3/uL (ref 0.1–1.0)
Monocytes Relative: 12 %
Neutro Abs: 3.6 10*3/uL (ref 1.7–7.7)
Neutrophils Relative %: 71 %
Platelets: 399 10*3/uL (ref 150–400)
RBC: 5.5 MIL/uL (ref 4.22–5.81)
RDW: 24.7 % — ABNORMAL HIGH (ref 11.5–15.5)
WBC: 5.1 10*3/uL (ref 4.0–10.5)
nRBC: 0.6 % — ABNORMAL HIGH (ref 0.0–0.2)

## 2019-05-27 LAB — GLUCOSE, CAPILLARY
Glucose-Capillary: 136 mg/dL — ABNORMAL HIGH (ref 70–99)
Glucose-Capillary: 104 mg/dL — ABNORMAL HIGH (ref 70–99)
Glucose-Capillary: 129 mg/dL — ABNORMAL HIGH (ref 70–99)
Glucose-Capillary: 127 mg/dL — ABNORMAL HIGH (ref 70–99)

## 2019-05-27 LAB — BASIC METABOLIC PANEL
Anion gap: 14 (ref 5–15)
BUN: 16 mg/dL (ref 8–23)
CO2: 25 mmol/L (ref 22–32)
Calcium: 9.5 mg/dL (ref 8.9–10.3)
Chloride: 104 mmol/L (ref 98–111)
Creatinine, Ser: 0.6 mg/dL — ABNORMAL LOW (ref 0.61–1.24)
GFR calc Af Amer: >60 mL/min (ref >60)
GFR calc non Af Amer: >60 mL/min (ref >60)
Glucose, Bld: 143 mg/dL — ABNORMAL HIGH (ref 70–99)
Potassium: 4 mmol/L (ref 3.5–5.1)
Sodium: 143 mmol/L (ref 135–145)

## 2019-05-27 LAB — PHOSPHORUS
Phosphorus: 3.1 mg/dL (ref 2.5–4.6)
Phosphorus: 3 mg/dL (ref 2.5–4.6)

## 2019-05-27 LAB — MAGNESIUM
Magnesium: 2 mg/dL (ref 1.7–2.4)
Magnesium: 2 mg/dL (ref 1.7–2.4)

## 2019-05-27 MED ORDER — LIP MEDEX EX OINT
TOPICAL_OINTMENT | CUTANEOUS | Status: AC
  Administered 2019-05-27: 19:00:00
  Filled 2019-05-27: qty 7

## 2019-05-27 NOTE — Progress Notes (Signed)
Physical Therapy Treatment Patient Details Name: Richard [name] MRN: [id number] DOB: 27-Nov-1940 Today's Date: 05/27/2019    History of Present Illness Richard [name] is a 78 year old male with dementia, A. fib with RVR, hypertension, hyperlipidemia, history of right common femoral DVT on 01/31/2019, stage III non-small cell lung cancer who presents from home with poor oral intake and hypoxia on ambulation when checked by his home health RN. Admitted with HCAP.    PT Comments    Pt in bed easily aroused to name.  Pt appears AxO x 1.  Easily agittated.  Repeated "sh*#".  Pt did agree to sit EOB "for a cleaning".  Pt present with dementia and required MAX encouragement to increase self participation.  Pt only allowed [place] to stand him briefly long enough to change bed pad.  Pt declined to walk.    Follow Up Recommendations  Supervision/Assistance - 24 hour;Home health PT     Equipment Recommendations  None recommended by PT    Recommendations for Other Services       Precautions / Restrictions Precautions Precautions: Fall Precaution Comments: new PEG Restrictions Weight Bearing Restrictions: No    Mobility  Bed Mobility Overal bed mobility: Needs Assistance Bed Mobility: Supine to Sit     Supine to sit: Min assist;Mod assist     General bed mobility comments: increased time and repeat functional VC's to increase self participation  Transfers Overall transfer level: Needs assistance Equipment used: Rolling walker (2 wheeled) Transfers: Sit to/from Stand Sit to Stand: Min assist         General transfer comment: pt honary and required MAX instruction to complete any task.  Pt did perform sit to stand at EOB once but declined to amb.  Ambulation/Gait             General Gait Details: pt declined   Stairs             Wheelchair Mobility    Modified Rankin (Stroke Patients Only)       Balance                                             Cognition Arousal/Alertness: Awake/alert                                     General Comments: AxO x 1      Exercises      General Comments        Pertinent Vitals/Pain Pain Assessment: Faces Faces Pain Scale: Hurts a little bit Pain Location: ABD with supine to sit Pain Descriptors / Indicators: Grimacing Pain Intervention(s): Monitored during session;Repositioned    Home Living                      Prior Function            PT Goals (current goals can now be found in the care plan section) Progress towards PT goals: Progressing toward goals    Frequency           PT Plan Current plan remains appropriate    Co-evaluation              AM-PAC PT "6 Clicks" Mobility   Outcome Measure  Help needed turning from your back to your side while  in a flat bed without using bedrails?: A Little Help needed moving from lying on your back to sitting on the side of a flat bed without using bedrails?: A Little Help needed moving to and from a bed to a chair (including a wheelchair)?: A Little Help needed standing up from a chair using your arms (e.g., wheelchair or bedside chair)?: A Little Help needed to walk in hospital room?: A Little Help needed climbing 3-5 steps with a railing? : A Lot 6 Click Score: 17    End of Session Equipment Utilized During Treatment: Gait belt Activity Tolerance: Patient tolerated treatment well;Treatment limited secondary to agitation Patient left: in bed;with call bell/phone within reach;with bed alarm set Nurse Communication: Mobility status PT Visit Diagnosis: Muscle weakness (generalized) (M62.81);Other abnormalities of gait and mobility (R26.89)     Time: 1836-7255 PT Time Calculation (min) (ACUTE ONLY): 26 min  Charges:  $Gait Training: 8-22 mins $Therapeutic Activity: 8-22 mins                     Rica Koyanagi  PTA Acute  Rehabilitation Services Pager      [phone] Office       [phone]

## 2019-05-27 NOTE — Progress Notes (Signed)
PROGRESS NOTE    Wendall Isabell   XNA:[id number]  DOB: 1941/03/12  DOA: 05/18/2019 PCP: [name], NP   Brief Narrative:  Gared Gillie is a 78 year old male with dementia, A. fib with RVR, hypertension, hyperlipidemia, history of right common femoral DVT on 01/31/2019, stage III non-small cell lung cancer on chemo, presented from home with poor oral intake and hypoxia on ambulation when checked by his home health RN. Chest x-ray on admission showed COPD changes with bibasilar atelectasis, COVID-19 PCR was negative. Recent hospitalization last month for suspected sepsis from UTI. During this admission, pt was managed for HCAP, currently improved, but continues to have severe anorexia, very poor p.o. intake and severe malnutrition, multiple previous providers had discussions with daughter, [name], palliative care was consulted, daughter continued to insist on PEG tube placement. PEG tube placement done by IR on 05/25/19. Oncology also aware and NP following  Subjective: Today, pt denies any new complaints. Refusing to participate with PT. 14 Assessment & Plan:   HCAP versus postobstructive pneumonia Currently afebrile with no leukocytosis Pt with right middle lobe obstructing mass Initially treated with IV vancomycin and cefepime on admission, subsequently vancomycin was discontinued after 48 hours and cefepime after 4 to 5 days Monitor closely  Non-small cell cancer-diagnosed in July 2020 Most recent CT was in August 2020 and revealed a spiculated perihilar mass with obstruction of the right middle lobe and pretracheal enlarged lymph nodes Pt has received 8 cycles of chemotherapy Dr. Julien Nordmann, continue outpt follow up Staging CT done Follow up appt on 05/30/19  Severe protein-calorie malnutrition, severe anorexia, poor p.o. intake His daughter-[name] Passe had multiple discussions with my prior partners and continued to insist on feeding tube placement Palliative team  on board PEG tube placed per IR Dietician consulted for tube feedings, started 05/26/19, monitor electrolytes for about 3 days as pt is at risk for refeeding syndrome given severe malnutrition   Paroxysmal A-fib Heart rate somewhat controlled Not on any rate controlling agent at home IV metoprolol prn Continue Xarelto  Dementia Remains pleasantly confused No behavioral problems at this time, haldol ordered as needed.   h/o PE in August 2020 Xarelto  Time spent in minutes:  85min DVT prophylaxis: Xarelto Code Status: Full code Family Communication: None at bedside Disposition Plan: Family resistant to SNF, want to take home Consultants: Oncology, palliative MD  Procedures:   PEG tube placement    Antimicrobials:  Anti-infectives (From admission, onward)   Start     Dose/Rate Route Frequency Ordered Stop   05/25/19 0800  ceFAZolin (ANCEF) IVPB 2g/100 mL premix     2 g 200 mL/hr over 30 Minutes Intravenous To Radiology 05/24/19 1453 05/25/19 0906   05/19/19 1800  vancomycin (VANCOCIN) IVPB 1000 mg/200 mL premix  Status:  Discontinued     1,000 mg 200 mL/hr over 60 Minutes Intravenous Every 24 hours 05/19/19 1141 05/20/19 1124   05/19/19 0800  vancomycin (VANCOCIN) IVPB 750 mg/150 ml premix  Status:  Discontinued     750 mg 150 mL/hr over 60 Minutes Intravenous Every 12 hours 05/18/19 2025 05/19/19 1139   05/19/19 0200  ceFEPIme (MAXIPIME) 2 g in sodium chloride 0.9 % 100 mL IVPB  Status:  Discontinued     2 g 200 mL/hr over 30 Minutes Intravenous Every 8 hours 05/18/19 2025 05/22/19 1658   05/18/19 1815  vancomycin (VANCOCIN) 1,250 mg in sodium chloride 0.9 % 250 mL IVPB     1,250 mg 166.7 mL/hr over  90 Minutes Intravenous  Once 05/18/19 1806 05/18/19 2058   05/18/19 1815  piperacillin-tazobactam (ZOSYN) IVPB 3.375 g     3.375 g 100 mL/hr over 30 Minutes Intravenous  Once 05/18/19 1806 05/18/19 1921       Objective: Vitals:   05/26/19 1630 05/26/19 2028 05/27/19  0603 05/27/19 1335  BP:  116/72 119/61 127/73  Pulse: (!) 102 100 (!) 101 (!) 105  Resp:  16 16 16   Temp:  97.6 F (36.4 C) (!) 97.4 F (36.3 C) 97.7 F (36.5 C)  TempSrc:  Oral Oral Axillary  SpO2: 95% 94% 96% 98%  Weight:      Height:        Intake/Output Summary (Last 24 hours) at 05/27/2019 1512 Last data filed at 05/27/2019 1413 Gross per 24 hour  Intake 932.58 ml  Output 650 ml  Net 282.58 ml   Filed Weights   05/18/19 1450 05/19/19 0325  Weight: 63.6 kg 60.9 kg    Examination:  General: NAD, chronically ill-appearing, cachetic   Cardiovascular: S1, S2 present  Respiratory: CTAB  Abdomen: Soft, nontender, nondistended, bowel sounds present, G-tube in place  Musculoskeletal: No bilateral pedal edema noted  Skin: Normal  Psychiatry: Poor insight   Data Reviewed: I have personally reviewed following labs and imaging studies  CBC: Recent Labs  Lab 05/25/19 0403 05/27/19 0552  WBC 4.9 5.1  NEUTROABS 3.2 3.6  HGB 10.8* 11.0*  HCT 34.5* 35.3*  MCV 64.2* 64.2*  PLT 450* 659   Basic Metabolic Panel: Recent Labs  Lab 05/25/19 0403 05/26/19 1256 05/26/19 1602 05/27/19 0552  NA 140  --   --  143  K 4.0  --   --  4.0  CL 105  --   --  104  CO2 23  --   --  25  GLUCOSE 102*  --   --  143*  BUN 15  --   --  16  CREATININE 0.66  --   --  0.60*  CALCIUM 9.4  --   --  9.5  MG  --  1.9 2.1 2.0  PHOS  --  3.3 3.4 3.1   GFR: Estimated Creatinine Clearance: 67.7 mL/min (A) (by C-G formula based on SCr of 0.6 mg/dL (L)). Liver Function Tests: Recent Labs  Lab 05/25/19 0403  AST 18  ALT 12  ALKPHOS 75  BILITOT 0.6  PROT 7.2  ALBUMIN 2.4*   No results for input(s): LIPASE, AMYLASE in the last 168 hours. No results for input(s): AMMONIA in the last 168 hours. Coagulation Profile: Recent Labs  Lab 05/25/19 0403  INR 1.1   Cardiac Enzymes: No results for input(s): CKTOTAL, CKMB, CKMBINDEX, TROPONINI in the last 168 hours. BNP (last 3  results) No results for input(s): PROBNP in the last 8760 hours. HbA1C: No results for input(s): HGBA1C in the last 72 hours. CBG: Recent Labs  Lab 05/27/19 0724 05/27/19 1147  GLUCAP 136* 104*   Lipid Profile: No results for input(s): CHOL, HDL, LDLCALC, TRIG, CHOLHDL, LDLDIRECT in the last 72 hours. Thyroid Function Tests: No results for input(s): TSH, T4TOTAL, FREET4, T3FREE, THYROIDAB in the last 72 hours. Anemia Panel: No results for input(s): VITAMINB12, FOLATE, FERRITIN, TIBC, IRON, RETICCTPCT in the last 72 hours. Urine analysis:    Component Value Date/Time   COLORURINE YELLOW 05/19/2019 1913   APPEARANCEUR CLEAR 05/19/2019 1913   LABSPEC 1.020 05/19/2019 1913   PHURINE 8.0 05/19/2019 [place] 05/19/2019 1913   HGBUR  NEGATIVE 05/19/2019 [place] NEGATIVE 05/19/2019 [place] NEGATIVE 05/19/2019 [place] NEGATIVE 05/19/2019 1913   NITRITE NEGATIVE 05/19/2019 1913   LEUKOCYTESUR NEGATIVE 05/19/2019 1913   Recent Results (from the past 240 hour(s))  Culture, blood (routine x 2)     Status: None   Collection Time: 05/18/19  4:15 PM   Specimen: BLOOD  Result Value Ref Range Status   Specimen Description   Final    BLOOD RIGHT ANTECUBITAL Performed at [facility], [place] [address], [place], [place] [address]    Special Requests   Final    BOTTLES DRAWN AEROBIC AND ANAEROBIC Blood Culture adequate volume Performed at [facility] [address] [place], [place] [address]    Culture   Final    NO GROWTH 5 DAYS Performed at [facility], [place] [address] [place], [place] [address]    Report Status 05/23/2019 FINAL  Final  SARS CORONAVIRUS 2 (TAT 6-24 HRS) Nasopharyngeal Nasopharyngeal Swab     Status: None   Collection Time: 05/18/19  6:19 PM   Specimen: Nasopharyngeal Swab  Result Value Ref Range Status   SARS Coronavirus 2 NEGATIVE NEGATIVE Final    Comment: (NOTE) SARS-CoV-2 target  nucleic acids are NOT DETECTED. The SARS-CoV-2 RNA is generally detectable in upper and lower respiratory specimens during the acute phase of infection. Negative results do not preclude SARS-CoV-2 infection, do not rule out co-infections with other pathogens, and should not be used as the sole basis for treatment or other patient management decisions. Negative results must be combined with clinical observations, patient history, and epidemiological information. The expected result is Negative. Fact Sheet for Patients: SugarRoll.be Fact Sheet for Healthcare Providers: https://www.woods-mathews.com/ This test is not yet approved or cleared by the [place] FDA and  has been authorized for detection and/or diagnosis of SARS-CoV-2 by FDA under an Emergency Use Authorization (EUA). This EUA will remain  in effect (meaning this test can be used) for the duration of the COVID-19 declaration under Section 56 4(b)(1) of the Act, 21 U.S.C. section 360bbb-3(b)(1), unless the authorization is terminated or revoked sooner. Performed at [facility], [place] [address], [place], [place] [address]   Culture, blood (routine x 2)     Status: None   Collection Time: 05/19/19  4:03 AM   Specimen: BLOOD LEFT HAND  Result Value Ref Range Status   Specimen Description   Final    BLOOD LEFT HAND Performed at [facility] [address] [place], [place] [address]    Special Requests   Final    BOTTLES DRAWN AEROBIC ONLY Blood Culture results may not be optimal due to an inadequate volume of blood received in culture bottles Performed at [facility] [address] [place], [place] [address]    Culture   Final    NO GROWTH 5 DAYS Performed at [facility], [place] [address] [place], [place] [address]    Report Status 05/24/2019 FINAL  Final  MRSA PCR Screening     Status: None   Collection Time: 05/20/19  7:51  AM   Specimen: Nasal Mucosa; Nasopharyngeal  Result Value Ref Range Status   MRSA by PCR NEGATIVE NEGATIVE Final    Comment:        The GeneXpert MRSA Assay (FDA approved for NASAL specimens only), is one component of a comprehensive MRSA colonization surveillance program. It is not intended  to diagnose MRSA infection nor to guide or monitor treatment for MRSA infections. Performed at [facility], [place] [address] [place], [place] [address]    Radiology Studies: CT CHEST W CONTRAST  Result Date: 05/26/2019 CLINICAL DATA:  Restaging lung cancer. Status post chemotherapy and radiation therapy. EXAM: CT CHEST WITH CONTRAST TECHNIQUE: Multidetector CT imaging of the chest was performed during intravenous contrast administration. CONTRAST:  48mL OMNIPAQUE IOHEXOL 300 MG/ML  SOLN COMPARISON:  02/05/2019 FINDINGS: Cardiovascular: The heart is normal in size. No pericardial effusion. Stable tortuosity, ectasia and calcification of the thoracic aorta. No dissection. Stable coronary artery calcifications and probable stents. Mild pulmonary artery enlargement suggesting pulmonary hypertension. Mediastinum/Nodes: No discrete right hilar adenopathy. Stable small scattered mediastinal lymph nodes. 7 mm precarinal lymph node is unchanged. 8 mm AP window node is stable. No subcarinal adenopathy. Lungs/Pleura: The large necrotic right hilar mass is now a largely cystic/cavitary area with minimal residual soft tissue density laterally and posteriorly. This would suggest a good response to treatment. There is persistent complete right middle lobe atelectasis and bronchiectasis. Stable underlying severe changes of pulmonary emphysema with extensive pulmonary scarring changes. 6 mm right upper lobe pulmonary nodule on image 47/7 probably obscured on the prior study by a pleural effusion. 6.5 mm nodular lesion in the right upper lobe on image number 74/7. I do not see this for certain on the prior  study but may have been obscured. 4 mm left lower lobe pulmonary nodule on image 112/7, unchanged. Resolution of pleural effusions seen on the prior study. Upper Abdomen: No significant upper abdominal findings. No hepatic or adrenal gland lesions are identified. Musculoskeletal: No chest wall mass, supraclavicular or axillary adenopathy. The thyroid gland appears normal. Moderately heterogeneous appearance of the bone marrow without discrete lesions. IMPRESSION: 1. The large right hilar lesion is now a thin walled cavitary lesion suggesting a good response to treatment. 2. Stable mediastinal and hilar lymph nodes. 3. Persistent complete right middle lobe atelectasis and bronchiectasis. 4. Three pulmonary lesions as detailed above. Recommend continued surveillance. 5. Severe emphysematous changes and pulmonary scarring. 6. No findings for upper abdominal metastatic disease. Aortic Atherosclerosis (ICD10-I70.0) and Emphysema (ICD10-J43.9). Electronically Signed   By: Marijo Sanes M.D.   On: 05/26/2019 13:29   Scheduled Meds: . dronabinol  2.5 mg Oral BID AC  . feeding supplement (PRO-STAT SUGAR FREE 64)  30 mL Per Tube Daily  . feeding supplement (VITAL HIGH PROTEIN)  1,000 mL Per Tube Q24H  . free water  100 mL Per Tube Q6H  . rivaroxaban  20 mg Per Tube Q supper  . sodium chloride flush  10-40 mL Intracatheter Q12H   Continuous Infusions: . feeding supplement (OSMOLITE 1.2 CAL) 35 mL/hr at 05/27/19 0130     LOS: 9 days   Alma Friendly, MD [facility]  05/27/2019, 3:12 PM

## 2019-05-28 LAB — GLUCOSE, CAPILLARY
Glucose-Capillary: 145 mg/dL — ABNORMAL HIGH (ref 70–99)
Glucose-Capillary: 137 mg/dL — ABNORMAL HIGH (ref 70–99)
Glucose-Capillary: 140 mg/dL — ABNORMAL HIGH (ref 70–99)
Glucose-Capillary: 128 mg/dL — ABNORMAL HIGH (ref 70–99)
Glucose-Capillary: 101 mg/dL — ABNORMAL HIGH (ref 70–99)
Glucose-Capillary: 143 mg/dL — ABNORMAL HIGH (ref 70–99)
Glucose-Capillary: 124 mg/dL — ABNORMAL HIGH (ref 70–99)

## 2019-05-28 LAB — BASIC METABOLIC PANEL
Anion gap: 11 (ref 5–15)
BUN: 16 mg/dL (ref 8–23)
CO2: 26 mmol/L (ref 22–32)
Calcium: 9.2 mg/dL (ref 8.9–10.3)
Chloride: 105 mmol/L (ref 98–111)
Creatinine, Ser: 0.54 mg/dL — ABNORMAL LOW (ref 0.61–1.24)
GFR calc Af Amer: >60 mL/min (ref >60)
GFR calc non Af Amer: >60 mL/min (ref >60)
Glucose, Bld: 128 mg/dL — ABNORMAL HIGH (ref 70–99)
Potassium: 3.8 mmol/L (ref 3.5–5.1)
Sodium: 142 mmol/L (ref 135–145)

## 2019-05-28 LAB — CBC WITH DIFFERENTIAL/PLATELET
Abs Immature Granulocytes: 0.02 10*3/uL (ref 0.00–0.07)
Basophils Absolute: 0 10*3/uL (ref 0.0–0.1)
Basophils Relative: 0 %
Eosinophils Absolute: 0.1 10*3/uL (ref 0.0–0.5)
Eosinophils Relative: 2 %
HCT: 30.9 % — ABNORMAL LOW (ref 39.0–52.0)
Hemoglobin: 9.8 g/dL — ABNORMAL LOW (ref 13.0–17.0)
Immature Granulocytes: 0 %
Lymphocytes Relative: 14 %
Lymphs Abs: 0.9 10*3/uL (ref 0.7–4.0)
MCH: 20.1 pg — ABNORMAL LOW (ref 26.0–34.0)
MCHC: 31.7 g/dL (ref 30.0–36.0)
MCV: 63.4 fL — ABNORMAL LOW (ref 80.0–100.0)
Monocytes Absolute: 0.7 10*3/uL (ref 0.1–1.0)
Monocytes Relative: 11 %
Neutro Abs: 4.5 10*3/uL (ref 1.7–7.7)
Neutrophils Relative %: 73 %
Platelets: 414 10*3/uL — ABNORMAL HIGH (ref 150–400)
RBC: 4.87 MIL/uL (ref 4.22–5.81)
RDW: 24.2 % — ABNORMAL HIGH (ref 11.5–15.5)
WBC: 6.2 10*3/uL (ref 4.0–10.5)
nRBC: 0.3 % — ABNORMAL HIGH (ref 0.0–0.2)

## 2019-05-28 MED ORDER — METOPROLOL TARTRATE 25 MG PO TABS
12.5000 mg | ORAL_TABLET | Freq: Two times a day (BID) | ORAL | Status: DC
  Administered 2019-05-28 – 2019-05-30 (×5): 12.5 mg via ORAL
  Filled 2019-05-28 (×5): qty 1

## 2019-05-28 NOTE — Progress Notes (Signed)
   05/28/19 0544  Vitals  Temp 97.6 F (36.4 C)  Temp Source Axillary  BP 99/60  MAP (mmHg) 73  BP Location Left Arm  BP Method Automatic  Patient Position (if appropriate) Lying  Pulse Rate (!) 102  Pulse Rate Source Monitor  Resp 16  Oxygen Therapy  SpO2 94 %  O2 Device Room Air  MEWS Score  MEWS RR 0  MEWS Pulse 1  MEWS Systolic 1  MEWS LOC 0  MEWS Temp 0  MEWS Score 2  MEWS Score Color Yellow  MEWS Assessment  Is this an acute change? Yes  MEWS guidelines implemented *See [place]  Provider Notification  Provider Name/Title Lennox Grumbles  Date Provider Notified 05/28/19  Time Provider Notified [phone]  Notification Type Page  Notification Reason Other (Comment) (MEWS 2: BP 99/60 P: 102)

## 2019-05-28 NOTE — Progress Notes (Signed)
PROGRESS NOTE    Richard [name]   POE:[id number]  DOB: 1940/08/04  DOA: 05/18/2019 PCP: [name], NP   Brief Narrative:  Richard [name] is a 78 year old male with dementia, A. fib with RVR, hypertension, hyperlipidemia, history of right common femoral DVT on 01/31/2019, stage III non-small cell lung cancer on chemo, presented from home with poor oral intake and hypoxia on ambulation when checked by his home health RN. Chest x-ray on admission showed COPD changes with bibasilar atelectasis, COVID-19 PCR was negative. Recent hospitalization last month for suspected sepsis from UTI. During this admission, pt was managed for HCAP, currently improved, but continues to have severe anorexia, very poor p.o. intake and severe malnutrition, multiple previous providers had discussions with daughter, [name], palliative care was consulted, daughter continued to insist on PEG tube placement. PEG tube placement done by IR on 05/25/19. Oncology also aware and NP following  Subjective: Today, patient denies any new complaints, continues to say curse words, easily agitated    Assessment & Plan:   HCAP versus postobstructive pneumonia Currently afebrile with no leukocytosis Pt with right middle lobe obstructing mass Initially treated with IV vancomycin and cefepime on admission, subsequently vancomycin was discontinued after 48 hours and cefepime after 4 to 5 days Monitor closely  Non-small cell cancer-diagnosed in July 2020 Most recent CT was in August 2020 and revealed a spiculated perihilar mass with obstruction of the right middle lobe and pretracheal enlarged lymph nodes Pt has received 8 cycles of chemotherapy Dr. Julien Nordmann, continue outpt follow up Staging CT done Follow up appt on 05/30/19  Severe protein-calorie malnutrition, severe anorexia, poor p.o. intake His daughter-[name] Keelan had multiple discussions with my prior partners and continued to insist on feeding tube  placement Palliative team on board PEG tube placed per IR Dietician consulted for tube feedings, started 05/26/19, monitor electrolytes for about 3 days as pt is at risk for refeeding syndrome given severe malnutrition.  Dietitian plans to reevaluate, and switch to bolus feeds prior to discharge.  Patient will need home health RN to assist in tube feeds at home  Paroxysmal A-fib Heart rate somewhat controlled Not on any rate controlling agent at home Started scheduled metoprolol tartrate Continue Xarelto  Dementia Remains pleasantly confused, says a lot of curse words No behavioral problems at this time, haldol ordered as needed.   h/o PE in August 2020 Xarelto   DVT prophylaxis: Xarelto Code Status: Full code Family Communication: None at bedside Disposition Plan: Family resistant to SNF, want to take home Consultants: Oncology, palliative MD  Procedures:   PEG tube placement    Antimicrobials:  Anti-infectives (From admission, onward)   Start     Dose/Rate Route Frequency Ordered Stop   05/25/19 0800  ceFAZolin (ANCEF) IVPB 2g/100 mL premix     2 g 200 mL/hr over 30 Minutes Intravenous To Radiology 05/24/19 1453 05/25/19 0906   05/19/19 1800  vancomycin (VANCOCIN) IVPB 1000 mg/200 mL premix  Status:  Discontinued     1,000 mg 200 mL/hr over 60 Minutes Intravenous Every 24 hours 05/19/19 1141 05/20/19 1124   05/19/19 0800  vancomycin (VANCOCIN) IVPB 750 mg/150 ml premix  Status:  Discontinued     750 mg 150 mL/hr over 60 Minutes Intravenous Every 12 hours 05/18/19 2025 05/19/19 1139   05/19/19 0200  ceFEPIme (MAXIPIME) 2 g in sodium chloride 0.9 % 100 mL IVPB  Status:  Discontinued     2 g 200 mL/hr over 30 Minutes Intravenous Every  8 hours 05/18/19 2025 05/22/19 1658   05/18/19 1815  vancomycin (VANCOCIN) 1,250 mg in sodium chloride 0.9 % 250 mL IVPB     1,250 mg 166.7 mL/hr over 90 Minutes Intravenous  Once 05/18/19 1806 05/18/19 2058   05/18/19 1815   piperacillin-tazobactam (ZOSYN) IVPB 3.375 g     3.375 g 100 mL/hr over 30 Minutes Intravenous  Once 05/18/19 1806 05/18/19 1921       Objective: Vitals:   05/28/19 0544 05/28/19 0736 05/28/19 0950 05/28/19 0950  BP: 99/60 116/67 123/70 123/70  Pulse: (!) 102 (!) 110 (!) 102 (!) 101  Resp: 16 15 16 16   Temp: 97.6 F (36.4 C) 98.4 F (36.9 C) 99.2 F (37.3 C) 99.2 F (37.3 C)  TempSrc: Axillary Oral Oral Oral  SpO2: 94% 100% 93% 94%  Weight:      Height:        Intake/Output Summary (Last 24 hours) at 05/28/2019 1400 Last data filed at 05/28/2019 0800 Gross per 24 hour  Intake 1171.5 ml  Output 400 ml  Net 771.5 ml   Filed Weights   05/18/19 1450 05/19/19 0325  Weight: 63.6 kg 60.9 kg    Examination:  General: NAD, chronically ill-appearing, cachectic  Cardiovascular: S1, S2 present  Respiratory: CTAB  Abdomen: Soft, nontender, nondistended, bowel sounds present, G-tube in place  Musculoskeletal: No bilateral pedal edema noted  Skin: Normal  Psychiatry:  Poor insight   Data Reviewed: I have personally reviewed following labs and imaging studies  CBC: Recent Labs  Lab 05/25/19 0403 05/27/19 0552 05/28/19 0604  WBC 4.9 5.1 6.2  NEUTROABS 3.2 3.6 4.5  HGB 10.8* 11.0* 9.8*  HCT 34.5* 35.3* 30.9*  MCV 64.2* 64.2* 63.4*  PLT 450* 399 025*   Basic Metabolic Panel: Recent Labs  Lab 05/25/19 0403 05/26/19 1256 05/26/19 1602 05/27/19 0552 05/27/19 1655 05/28/19 0604  NA 140  --   --  143  --  142  K 4.0  --   --  4.0  --  3.8  CL 105  --   --  104  --  105  CO2 23  --   --  25  --  26  GLUCOSE 102*  --   --  143*  --  128*  BUN 15  --   --  16  --  16  CREATININE 0.66  --   --  0.60*  --  0.54*  CALCIUM 9.4  --   --  9.5  --  9.2  MG  --  1.9 2.1 2.0 2.0  --   PHOS  --  3.3 3.4 3.1 3.0  --    GFR: Estimated Creatinine Clearance: 67.7 mL/min (A) (by C-G formula based on SCr of 0.54 mg/dL (L)). Liver Function Tests: Recent Labs  Lab  05/25/19 0403  AST 18  ALT 12  ALKPHOS 75  BILITOT 0.6  PROT 7.2  ALBUMIN 2.4*   No results for input(s): LIPASE, AMYLASE in the last 168 hours. No results for input(s): AMMONIA in the last 168 hours. Coagulation Profile: Recent Labs  Lab 05/25/19 0403  INR 1.1   Cardiac Enzymes: No results for input(s): CKTOTAL, CKMB, CKMBINDEX, TROPONINI in the last 168 hours. BNP (last 3 results) No results for input(s): PROBNP in the last 8760 hours. HbA1C: No results for input(s): HGBA1C in the last 72 hours. CBG: Recent Labs  Lab 05/27/19 2134 05/28/19 0236 05/28/19 0541 05/28/19 0738 05/28/19 1209  GLUCAP 127* 145* 137*  140* 128*   Lipid Profile: No results for input(s): CHOL, HDL, LDLCALC, TRIG, CHOLHDL, LDLDIRECT in the last 72 hours. Thyroid Function Tests: No results for input(s): TSH, T4TOTAL, FREET4, T3FREE, THYROIDAB in the last 72 hours. Anemia Panel: No results for input(s): VITAMINB12, FOLATE, FERRITIN, TIBC, IRON, RETICCTPCT in the last 72 hours. Urine analysis:    Component Value Date/Time   COLORURINE YELLOW 05/19/2019 1913   APPEARANCEUR CLEAR 05/19/2019 1913   LABSPEC 1.020 05/19/2019 1913   PHURINE 8.0 05/19/2019 1913   GLUCOSEU NEGATIVE 05/19/2019 [place] NEGATIVE 05/19/2019 [place] NEGATIVE 05/19/2019 [place] NEGATIVE 05/19/2019 1913   PROTEINUR NEGATIVE 05/19/2019 1913   NITRITE NEGATIVE 05/19/2019 1913   LEUKOCYTESUR NEGATIVE 05/19/2019 1913   Recent Results (from the past 240 hour(s))  Culture, blood (routine x 2)     Status: None   Collection Time: 05/18/19  4:15 PM   Specimen: BLOOD  Result Value Ref Range Status   Specimen Description   Final    BLOOD RIGHT ANTECUBITAL Performed at [facility], [place] [address], [place], [place] [address]    Special Requests   Final    BOTTLES DRAWN AEROBIC AND ANAEROBIC Blood Culture adequate volume Performed at [facility] [address],  [place], [place] [address]    Culture   Final    NO GROWTH 5 DAYS Performed at [facility], [place] [address], [place], [place] [address]    Report Status 05/23/2019 FINAL  Final  SARS CORONAVIRUS 2 (TAT 6-24 HRS) Nasopharyngeal Nasopharyngeal Swab     Status: None   Collection Time: 05/18/19  6:19 PM   Specimen: Nasopharyngeal Swab  Result Value Ref Range Status   SARS Coronavirus 2 NEGATIVE NEGATIVE Final    Comment: (NOTE) SARS-CoV-2 target nucleic acids are NOT DETECTED. The SARS-CoV-2 RNA is generally detectable in upper and lower respiratory specimens during the acute phase of infection. Negative results do not preclude SARS-CoV-2 infection, do not rule out co-infections with other pathogens, and should not be used as the sole basis for treatment or other patient management decisions. Negative results must be combined with clinical observations, patient history, and epidemiological information. The expected result is Negative. Fact Sheet for Patients: SugarRoll.be Fact Sheet for Healthcare Providers: https://www.woods-mathews.com/ This test is not yet approved or cleared by the [place] FDA and  has been authorized for detection and/or diagnosis of SARS-CoV-2 by FDA under an Emergency Use Authorization (EUA). This EUA will remain  in effect (meaning this test can be used) for the duration of the COVID-19 declaration under Section 56 4(b)(1) of the Act, 21 U.S.C. section 360bbb-3(b)(1), unless the authorization is terminated or revoked sooner. Performed at [facility], [place] [address] [place], [place] [address]   Culture, blood (routine x 2)     Status: None   Collection Time: 05/19/19  4:03 AM   Specimen: BLOOD LEFT HAND  Result Value Ref Range Status   Specimen Description   Final    BLOOD LEFT HAND Performed at [facility] [address], [place], [place] [address]    Special Requests   Final     BOTTLES DRAWN AEROBIC ONLY Blood Culture results may not be optimal due to an inadequate volume of blood received in culture bottles Performed at [facility] [address], [place], [place] [address]    Culture   Final    NO GROWTH 5  DAYS Performed at [facility], [place] [address] [place], [place] [address]    Report Status 05/24/2019 FINAL  Final  MRSA PCR Screening     Status: None   Collection Time: 05/20/19  7:51 AM   Specimen: Nasal Mucosa; Nasopharyngeal  Result Value Ref Range Status   MRSA by PCR NEGATIVE NEGATIVE Final    Comment:        The GeneXpert MRSA Assay (FDA approved for NASAL specimens only), is one component of a comprehensive MRSA colonization surveillance program. It is not intended to diagnose MRSA infection nor to guide or monitor treatment for MRSA infections. Performed at [facility], [place] [address] [place], [place] [address]    Radiology Studies: No results found. Scheduled Meds: . dronabinol  2.5 mg Oral BID AC  . feeding supplement (PRO-STAT SUGAR FREE 64)  30 mL Per Tube Daily  . feeding supplement (VITAL HIGH PROTEIN)  1,000 mL Per Tube Q24H  . free water  100 mL Per Tube Q6H  . metoprolol tartrate  12.5 mg Oral BID  . rivaroxaban  20 mg Per Tube Q supper  . sodium chloride flush  10-40 mL Intracatheter Q12H   Continuous Infusions: . feeding supplement (OSMOLITE 1.2 CAL) 55 mL/hr at 05/28/19 0207     LOS: 10 days   Alma Friendly, MD [facility]  05/28/2019, 2:00 PM

## 2019-05-29 LAB — CBC WITH DIFFERENTIAL/PLATELET
Abs Immature Granulocytes: 0.02 10*3/uL (ref 0.00–0.07)
Basophils Absolute: 0 10*3/uL (ref 0.0–0.1)
Basophils Relative: 0 %
Eosinophils Absolute: 0.1 10*3/uL (ref 0.0–0.5)
Eosinophils Relative: 2 %
HCT: 34.2 % — ABNORMAL LOW (ref 39.0–52.0)
Hemoglobin: 10.5 g/dL — ABNORMAL LOW (ref 13.0–17.0)
Immature Granulocytes: 0 %
Lymphocytes Relative: 15 %
Lymphs Abs: 0.8 10*3/uL (ref 0.7–4.0)
MCH: 19.7 pg — ABNORMAL LOW (ref 26.0–34.0)
MCHC: 30.7 g/dL (ref 30.0–36.0)
MCV: 64.2 fL — ABNORMAL LOW (ref 80.0–100.0)
Monocytes Absolute: 0.4 10*3/uL (ref 0.1–1.0)
Monocytes Relative: 8 %
Neutro Abs: 4.1 10*3/uL (ref 1.7–7.7)
Neutrophils Relative %: 75 %
Platelets: 396 10*3/uL (ref 150–400)
RBC: 5.33 MIL/uL (ref 4.22–5.81)
RDW: 24.7 % — ABNORMAL HIGH (ref 11.5–15.5)
WBC: 5.5 10*3/uL (ref 4.0–10.5)
nRBC: 0 % (ref 0.0–0.2)

## 2019-05-29 LAB — GLUCOSE, CAPILLARY
Glucose-Capillary: 112 mg/dL — ABNORMAL HIGH (ref 70–99)
Glucose-Capillary: 119 mg/dL — ABNORMAL HIGH (ref 70–99)
Glucose-Capillary: 91 mg/dL (ref 70–99)
Glucose-Capillary: 124 mg/dL — ABNORMAL HIGH (ref 70–99)
Glucose-Capillary: 105 mg/dL — ABNORMAL HIGH (ref 70–99)

## 2019-05-29 LAB — BASIC METABOLIC PANEL
Anion gap: 9 (ref 5–15)
BUN: 15 mg/dL (ref 8–23)
CO2: 26 mmol/L (ref 22–32)
Calcium: 9.4 mg/dL (ref 8.9–10.3)
Chloride: 106 mmol/L (ref 98–111)
Creatinine, Ser: 0.51 mg/dL — ABNORMAL LOW (ref 0.61–1.24)
GFR calc Af Amer: >60 mL/min (ref >60)
GFR calc non Af Amer: >60 mL/min (ref >60)
Glucose, Bld: 140 mg/dL — ABNORMAL HIGH (ref 70–99)
Potassium: 4.5 mmol/L (ref 3.5–5.1)
Sodium: 141 mmol/L (ref 135–145)

## 2019-05-29 NOTE — Progress Notes (Signed)
PROGRESS NOTE    Richard [name]   URK:[id number]  DOB: Oct 26, 1940  DOA: 05/18/2019 PCP: [name], NP   Brief Narrative:  Richard [name] is a 78 year old male with dementia, A. fib with RVR, hypertension, hyperlipidemia, history of right common femoral DVT on 01/31/2019, stage III non-small cell lung cancer on chemo, presented from home with poor oral intake and hypoxia on ambulation when checked by his home health RN. Chest x-ray on admission showed COPD changes with bibasilar atelectasis, COVID-19 PCR was negative. Recent hospitalization last month for suspected sepsis from UTI. During this admission, pt was managed for HCAP, currently improved, but continues to have severe anorexia, very poor p.o. intake and severe malnutrition, multiple previous providers had discussions with daughter, [name], palliative care was consulted, daughter continued to insist on PEG tube placement. PEG tube placement done by IR on 05/25/19. Oncology also aware and NP following  Subjective: Today, patient denies any new complaints.  Looks comfortable, but easily agitated.    Assessment & Plan:   HCAP versus postobstructive pneumonia Currently afebrile with no leukocytosis Pt with right middle lobe obstructing mass Initially treated with IV vancomycin and cefepime on admission, subsequently vancomycin was discontinued after 48 hours and cefepime after 4 to 5 days Monitor closely  Non-small cell cancer-diagnosed in July 2020 Most recent CT was in August 2020 and revealed a spiculated perihilar mass with obstruction of the right middle lobe and pretracheal enlarged lymph nodes Pt has received 8 cycles of chemotherapy Dr. Julien Nordmann, continue outpt follow up Staging CT done Follow up appt on 05/30/19, will need to reschedule  Severe protein-calorie malnutrition, severe anorexia, poor p.o. intake His daughter-Richard [name] had multiple discussions with my prior partners and continued to insist on  feeding tube placement Palliative team on board PEG tube placed per IR Dietician consulted for tube feedings, started 05/26/19, monitor electrolytes for about 3 days as pt is at risk for refeeding syndrome given severe malnutrition.  Dietitian plans to reevaluate, and switch to bolus feeds prior to discharge.  Patient will need home health RN to assist in tube feeds at home  Paroxysmal A-fib Heart rate much controlled after starting BB Not on any rate controlling agent at home Started scheduled metoprolol tartrate Continue Xarelto  Dementia Remains pleasantly confused, says a lot of curse words No behavioral problems at this time, haldol ordered as needed.   History of PE in August 2020 Xarelto   DVT prophylaxis: Xarelto Code Status: Full code Family Communication: None at bedside Disposition Plan: Family resistant to SNF, want to take home, likely 05/30/19 when dietician has re-evaluated tube feedings and no longer at risk for refeeding syndrome Consultants: Oncology, palliative MD  Procedures:   PEG tube placement    Antimicrobials:  Anti-infectives (From admission, onward)   Start     Dose/Rate Route Frequency Ordered Stop   05/25/19 0800  ceFAZolin (ANCEF) IVPB 2g/100 mL premix     2 g 200 mL/hr over 30 Minutes Intravenous To Radiology 05/24/19 1453 05/25/19 0906   05/19/19 1800  vancomycin (VANCOCIN) IVPB 1000 mg/200 mL premix  Status:  Discontinued     1,000 mg 200 mL/hr over 60 Minutes Intravenous Every 24 hours 05/19/19 1141 05/20/19 1124   05/19/19 0800  vancomycin (VANCOCIN) IVPB 750 mg/150 ml premix  Status:  Discontinued     750 mg 150 mL/hr over 60 Minutes Intravenous Every 12 hours 05/18/19 2025 05/19/19 1139   05/19/19 0200  ceFEPIme (MAXIPIME) 2 g in sodium  chloride 0.9 % 100 mL IVPB  Status:  Discontinued     2 g 200 mL/hr over 30 Minutes Intravenous Every 8 hours 05/18/19 2025 05/22/19 1658   05/18/19 1815  vancomycin (VANCOCIN) 1,250 mg in sodium  chloride 0.9 % 250 mL IVPB     1,250 mg 166.7 mL/hr over 90 Minutes Intravenous  Once 05/18/19 1806 05/18/19 2058   05/18/19 1815  piperacillin-tazobactam (ZOSYN) IVPB 3.375 g     3.375 g 100 mL/hr over 30 Minutes Intravenous  Once 05/18/19 1806 05/18/19 1921       Objective: Vitals:   05/28/19 1835 05/28/19 1919 05/29/19 0411 05/29/19 1332  BP: 118/68 114/76 124/75 130/69  Pulse: 96 94 92 86  Resp: 15 15 15 14   Temp: 97.7 F (36.5 C) 97.7 F (36.5 C) 97.7 F (36.5 C)   TempSrc: Oral Oral Oral   SpO2: 95% 98% 98% 98%  Weight:      Height:        Intake/Output Summary (Last 24 hours) at 05/29/2019 1652 Last data filed at 05/29/2019 1048 Gross per 24 hour  Intake --  Output 475 ml  Net -475 ml   Filed Weights   05/18/19 1450 05/19/19 0325  Weight: 63.6 kg 60.9 kg    Examination:  General: NAD, chronically ill-appearing   Cardiovascular: S1, S2 present  Respiratory: CTAB  Abdomen: Soft, nontender, nondistended, bowel sounds present, G-tube in place  Musculoskeletal: No bilateral pedal edema noted  Skin: Normal  Psychiatry: Poor insight   Data Reviewed: I have personally reviewed following labs and imaging studies  CBC: Recent Labs  Lab 05/25/19 0403 05/27/19 0552 05/28/19 0604 05/29/19 0512  WBC 4.9 5.1 6.2 5.5  NEUTROABS 3.2 3.6 4.5 4.1  HGB 10.8* 11.0* 9.8* 10.5*  HCT 34.5* 35.3* 30.9* 34.2*  MCV 64.2* 64.2* 63.4* 64.2*  PLT 450* 399 414* 811   Basic Metabolic Panel: Recent Labs  Lab 05/25/19 0403 05/26/19 1256 05/26/19 1602 05/27/19 0552 05/27/19 1655 05/28/19 0604 05/29/19 0512  NA 140  --   --  143  --  142 141  K 4.0  --   --  4.0  --  3.8 4.5  CL 105  --   --  104  --  105 106  CO2 23  --   --  25  --  26 26  GLUCOSE 102*  --   --  143*  --  128* 140*  BUN 15  --   --  16  --  16 15  CREATININE 0.66  --   --  0.60*  --  0.54* 0.51*  CALCIUM 9.4  --   --  9.5  --  9.2 9.4  MG  --  1.9 2.1 2.0 2.0  --   --   PHOS  --  3.3 3.4  3.1 3.0  --   --    GFR: Estimated Creatinine Clearance: 67.7 mL/min (A) (by C-G formula based on SCr of 0.51 mg/dL (L)). Liver Function Tests: Recent Labs  Lab 05/25/19 0403  AST 18  ALT 12  ALKPHOS 75  BILITOT 0.6  PROT 7.2  ALBUMIN 2.4*   No results for input(s): LIPASE, AMYLASE in the last 168 hours. No results for input(s): AMMONIA in the last 168 hours. Coagulation Profile: Recent Labs  Lab 05/25/19 0403  INR 1.1   Cardiac Enzymes: No results for input(s): CKTOTAL, CKMB, CKMBINDEX, TROPONINI in the last 168 hours. BNP (last 3 results) No results for  input(s): PROBNP in the last 8760 hours. HbA1C: No results for input(s): HGBA1C in the last 72 hours. CBG: Recent Labs  Lab 05/28/19 2325 05/29/19 0400 05/29/19 0749 05/29/19 1203 05/29/19 1608  GLUCAP 124* 112* 119* 91 124*   Lipid Profile: No results for input(s): CHOL, HDL, LDLCALC, TRIG, CHOLHDL, LDLDIRECT in the last 72 hours. Thyroid Function Tests: No results for input(s): TSH, T4TOTAL, FREET4, T3FREE, THYROIDAB in the last 72 hours. Anemia Panel: No results for input(s): VITAMINB12, FOLATE, FERRITIN, TIBC, IRON, RETICCTPCT in the last 72 hours. Urine analysis:    Component Value Date/Time   COLORURINE YELLOW 05/19/2019 1913   APPEARANCEUR CLEAR 05/19/2019 1913   LABSPEC 1.020 05/19/2019 1913   PHURINE 8.0 05/19/2019 1913   GLUCOSEU NEGATIVE 05/19/2019 1913   HGBUR NEGATIVE 05/19/2019 [place] NEGATIVE 05/19/2019 [place] NEGATIVE 05/19/2019 1913   PROTEINUR NEGATIVE 05/19/2019 1913   NITRITE NEGATIVE 05/19/2019 1913   LEUKOCYTESUR NEGATIVE 05/19/2019 1913   Recent Results (from the past 240 hour(s))  MRSA PCR Screening     Status: None   Collection Time: 05/20/19  7:51 AM   Specimen: Nasal Mucosa; Nasopharyngeal  Result Value Ref Range Status   MRSA by PCR NEGATIVE NEGATIVE Final    Comment:        The GeneXpert MRSA Assay (FDA approved for NASAL specimens only), is one  component of a comprehensive MRSA colonization surveillance program. It is not intended to diagnose MRSA infection nor to guide or monitor treatment for MRSA infections. Performed at [facility], [place] [address], [place], [place] [address]    Radiology Studies: No results found. Scheduled Meds: . dronabinol  2.5 mg Oral BID AC  . feeding supplement (PRO-STAT SUGAR FREE 64)  30 mL Per Tube Daily  . free water  100 mL Per Tube Q6H  . metoprolol tartrate  12.5 mg Oral BID  . rivaroxaban  20 mg Per Tube Q supper  . sodium chloride flush  10-40 mL Intracatheter Q12H   Continuous Infusions: . feeding supplement (OSMOLITE 1.2 CAL) 1,000 mL (05/29/19 0555)     LOS: 11 days   Alma Friendly, MD [facility]  05/29/2019, 4:52 PM

## 2019-05-29 NOTE — Progress Notes (Signed)
Patient is refusing for nurse tech and this RN to turn him and clean him up. Will continue to monitor.

## 2019-05-30 ENCOUNTER — Telehealth: Payer: Self-pay | Admitting: Physician Assistant

## 2019-05-30 ENCOUNTER — Inpatient Hospital Stay: Payer: Medicare HMO | Admitting: Physician Assistant

## 2019-05-30 DIAGNOSIS — R627 Adult failure to thrive: Secondary | ICD-10-CM

## 2019-05-30 LAB — CBC WITH DIFFERENTIAL/PLATELET
Abs Immature Granulocytes: 0.03 10*3/uL (ref 0.00–0.07)
Basophils Absolute: 0 10*3/uL (ref 0.0–0.1)
Basophils Relative: 0 %
Eosinophils Absolute: 0.2 10*3/uL (ref 0.0–0.5)
Eosinophils Relative: 3 %
HCT: 34.8 % — ABNORMAL LOW (ref 39.0–52.0)
Hemoglobin: 10.7 g/dL — ABNORMAL LOW (ref 13.0–17.0)
Immature Granulocytes: 1 %
Lymphocytes Relative: 15 %
Lymphs Abs: 0.9 10*3/uL (ref 0.7–4.0)
MCH: 20 pg — ABNORMAL LOW (ref 26.0–34.0)
MCHC: 30.7 g/dL (ref 30.0–36.0)
MCV: 65.2 fL — ABNORMAL LOW (ref 80.0–100.0)
Monocytes Absolute: 0.6 10*3/uL (ref 0.1–1.0)
Monocytes Relative: 9 %
Neutro Abs: 4.6 10*3/uL (ref 1.7–7.7)
Neutrophils Relative %: 72 %
Platelets: 417 10*3/uL — ABNORMAL HIGH (ref 150–400)
RBC: 5.34 MIL/uL (ref 4.22–5.81)
RDW: 24.6 % — ABNORMAL HIGH (ref 11.5–15.5)
WBC: 6.3 10*3/uL (ref 4.0–10.5)
nRBC: 0.3 % — ABNORMAL HIGH (ref 0.0–0.2)

## 2019-05-30 LAB — BASIC METABOLIC PANEL
Anion gap: 11 (ref 5–15)
BUN: 14 mg/dL (ref 8–23)
CO2: 25 mmol/L (ref 22–32)
Calcium: 9.7 mg/dL (ref 8.9–10.3)
Chloride: 104 mmol/L (ref 98–111)
Creatinine, Ser: 0.52 mg/dL — ABNORMAL LOW (ref 0.61–1.24)
GFR calc Af Amer: >60 mL/min (ref >60)
GFR calc non Af Amer: >60 mL/min (ref >60)
Glucose, Bld: 107 mg/dL — ABNORMAL HIGH (ref 70–99)
Potassium: 4.8 mmol/L (ref 3.5–5.1)
Sodium: 140 mmol/L (ref 135–145)

## 2019-05-30 LAB — FERRITIN: Ferritin: 82 ng/mL (ref 24–336)

## 2019-05-30 LAB — IRON AND TIBC
Iron: 23 ug/dL — ABNORMAL LOW (ref 45–182)
Saturation Ratios: 8 % — ABNORMAL LOW (ref 17.9–39.5)
TIBC: 273 ug/dL (ref 250–450)
UIBC: 250 ug/dL

## 2019-05-30 LAB — GLUCOSE, CAPILLARY
Glucose-Capillary: 108 mg/dL — ABNORMAL HIGH (ref 70–99)
Glucose-Capillary: 102 mg/dL — ABNORMAL HIGH (ref 70–99)
Glucose-Capillary: 102 mg/dL — ABNORMAL HIGH (ref 70–99)
Glucose-Capillary: 87 mg/dL (ref 70–99)

## 2019-05-30 LAB — FOLATE: Folate: 11.8 ng/mL (ref >5.9)

## 2019-05-30 LAB — VITAMIN B12: Vitamin B-12: 823 pg/mL (ref 180–914)

## 2019-05-30 MED ORDER — FREE WATER: 100.0000 mL | Freq: Four times a day (QID) | Status: DC

## 2019-05-30 MED ORDER — OSMOLITE 1.5 CAL PO LIQD
237.0000 mL | Freq: Four times a day (QID) | ORAL | Status: DC
  Filled 2019-05-30 (×3): qty 237

## 2019-05-30 MED ORDER — PRO-STAT SUGAR FREE PO LIQD: 30.0000 mL | Freq: Every day | ORAL | 0 refills | Status: DC

## 2019-05-30 MED ORDER — OSMOLITE 1.5 CAL PO LIQD: 237.0000 mL | Freq: Four times a day (QID) | ORAL | 0 refills | Status: DC

## 2019-05-30 NOTE — Progress Notes (Signed)
PT Cancellation Note  Patient Details Name: Richard [name] MRN: [id number] DOB: Mar 11, 1941   Cancelled Treatment:    Reason Eval/Treat Not Completed: Patient declined, no reason specified(Patient stating "I move when I'm ready to move and I'm not ready" when therapy arrived and encouraged mobility. Pt getting more aggitated with further encouragement fromt therapist repeating "no, no no"..."leave me alone lady".) Will re-attempt at later date/time.  Kipp Brood, PT, DPT Physical Therapist with [facility]  05/30/2019 12:27 PM

## 2019-05-30 NOTE — TOC Initial Note (Signed)
Transition of Care [facility]) - Initial/Assessment Note    Patient Details  Name: Richard [name] MRN: [id number] Date of Birth: Dec 05, 1940  Transition of Care [facility]) CM/SW Contact:    Brynnly Bonet, Marjie Skiff, RN Phone Number: 05/30/2019, 11:55 AM  Clinical Narrative:                 Pt was active with [facility] for [facility] prior to admission. [facility] contacted by this CM and they will continue services at discharge. Pt with new feeding tube and tube feedings. Daughter [name] to come to the hospital and learn to do tube feeds. MD to place [facility] orders and DME tube feeds.  Expected Discharge Plan: [place] Barriers to Discharge: Continued Medical Work up          Expected Discharge Plan and Services Expected Discharge Plan: [place]   Discharge Planning Services: CM Consult   Living arrangements for the past 2 months: Single Family Home                           HH Arranged: RN [place] Agency: [place] Date [place]: 05/30/19 Time HH Agency Contacted: 34 Representative spoke with at [place]: [place] Arrangements/Services Living arrangements for the past 2 months: [place] with:: Adult Children              Current home services: Home RN    Activities of Daily Living Home Assistive Devices/Equipment: None ADL Screening (condition at time of admission) Patient's cognitive ability adequate to safely complete daily activities?: No Is the patient deaf or have difficulty hearing?: No Does the patient have difficulty seeing, even when wearing glasses/contacts?: Yes Does the patient have difficulty concentrating, remembering, or making decisions?: Yes Patient able to express need for assistance with ADLs?: No Does the patient have difficulty dressing or bathing?: Yes Independently performs ADLs?: No Communication: Independent Is this a change from baseline?: Pre-admission  baseline Dressing (OT): Needs assistance Is this a change from baseline?: Pre-admission baseline Grooming: Independent Is this a change from baseline?: Pre-admission baseline Feeding: Independent Is this a change from baseline?: Pre-admission baseline Bathing: Needs assistance Is this a change from baseline?: Pre-admission baseline Toileting: Needs assistance Is this a change from baseline?: Pre-admission baseline In/Out Bed: Needs assistance Is this a change from baseline?: Pre-admission baseline Walks in Home: Needs assistance Is this a change from baseline?: Pre-admission baseline Does the patient have difficulty walking or climbing stairs?: Yes Weakness of Legs: Both Weakness of Arms/Hands: None  Permission Sought/Granted                  Emotional Assessment              Admission diagnosis:  HCAP (healthcare-associated pneumonia) [J18.9] Patient Active Problem List   Diagnosis Date Noted  . Pressure injury of skin 05/21/2019  . Anorexia 05/20/2019  . HCAP (healthcare-associated pneumonia) 05/18/2019  . Protein-calorie malnutrition, severe ([place]) 05/18/2019  . Sepsis ([place]) 05/08/2019  . History of pulmonary embolism 03/30/2019  . Pure hypercholesterolemia 03/30/2019  . SOB (shortness of breath) 03/30/2019  . Atrial fibrillation with RVR ([place])   . Palliative care by specialist   . DNR (do not resuscitate) discussion   . Severe sepsis ([place]) 01/30/2019  . Acute pulmonary embolism ([place]) 01/30/2019  . Acute respiratory failure with hypoxia ([place]) 01/30/2019  . AKI (acute kidney injury) ([place]) 01/30/2019  .  Poor appetite 01/05/2019  . Goals of care, counseling/discussion 12/28/2018  . Encounter for antineoplastic chemotherapy 12/28/2018  . Stage III squamous cell carcinoma of right lung ([place]) 12/28/2018  . Dementia without behavioral disturbance ([place]) 12/16/2018  . Lung mass 12/15/2018  . Pelvic mass in male 12/15/2018  . Postobstructive pneumonia 12/15/2018   . Hypertension    PCP:  [name], NP Pharmacy:   [place], [place] - [address] [place] [address] Phone: [phone] Fax: [phone]  [place]), [place] - [address] [place] [address] Phone: [phone] Fax: [place], [place] [address] Phone: [phone] Fax: [phone]     Social Determinants of Health ([place]) Interventions    Readmission Risk Interventions Readmission Risk Prevention Plan 05/30/2019  Transportation Screening Complete  Medication Review ([place]) Complete  PCP or Specialist appointment within 3-5 days of discharge Complete  HRI or [place] Complete  SW Recovery Care/Counseling Consult Complete  [place] Not Applicable  Some recent data might be hidden

## 2019-05-30 NOTE — Progress Notes (Signed)
Daughter, Yuriy Cui, at bedside. Educated daughter on bolus feeds and free water as ordered. Demonstrated to daughter how to check gastric residual prior to performing bolus feedings and free water. Daughter return demonstrated residual check. Demonstrated how to give bolus feeding and free water to gravity. Daughter verbalized understanding of how to provide bolus feeding and free water. No questions. Reviewed AVS and discharge instructions with daughter including medications, follow up appointments, and bolus feedings and free water expectations. Daughter verbalized understanding. Daughter also shared that she receivied a call regarding the delivery of the supplement to their home.

## 2019-05-30 NOTE — Progress Notes (Signed)
Nutrition Note  Received call from RN regarding plan to transition pt's tube feeds to bolus prior to discharge today.  Initial regimen will be as follows: Osmolite 1.5, 237 ml(1 carton) 4 times daily via PEG Free water flush of 30 ml before and after each bolus  -Advance as tolerated to goal regimen of 474 ml (2 cartons) of Osmolite 1.5 TID via PEG -Free water flushes of 30 ml before and after each bolus -Will need additional free water flush of 200 ml every 8 hours (600 ml) -This provides 2130 kcals, 89g protein and 1866 ml H2O daily   Please consult with additional nutrition needs  Clayton Bibles, MS, RD, LDN Inpatient Clinical Dietitian Pager: [phone] After Hours Pager: [phone]

## 2019-05-30 NOTE — Telephone Encounter (Signed)
Rescheduled appt per 12/14 sch message - pt wife is aware of appt date and time

## 2019-05-30 NOTE — Care Management Important Message (Signed)
Important Message  Patient Details IM Letter given to Marney Doctor RN Case Manager to present to the Patient Name: Richard [name] MRN: [id number] Date of Birth: 11/16/1940   Medicare Important Message Given:  Yes     Kerin Salen 05/30/2019, 10:47 AM

## 2019-05-30 NOTE — Discharge Summary (Signed)
Discharge Summary  Richard [name] PIR:[id number] DOB: 07-28-1940  PCP: [name], NP  Admit date: 05/18/2019 Discharge date: 05/30/2019  Time spent: 40 mins  Recommendations for Outpatient Follow-up:  1. PCP in 1 week 2. Oncology as scheduled  Discharge Diagnoses:  Active Hospital Problems   Diagnosis Date Noted  . HCAP (healthcare-associated pneumonia) 05/18/2019  . Pressure injury of skin 05/21/2019  . Anorexia 05/20/2019  . Protein-calorie malnutrition, severe ([place]) 05/18/2019  . Stage III squamous cell carcinoma of right lung ([place]) 12/28/2018  . Dementia without behavioral disturbance ([place]) 12/16/2018    Resolved Hospital Problems  No resolved problems to display.    Discharge Condition: Stable  Diet recommendation: Tube feedings  Vitals:   05/29/19 1332 05/29/19 1926  BP: 130/69 131/80  Pulse: 86 91  Resp: 14 15  Temp:    SpO2: 98% 100%    History of present illness:  Richard [name] is a 78 year old male with dementia, A. fib with RVR, hypertension, hyperlipidemia, history of right common femoral DVT on 01/31/2019, stage III non-small cell lung cancer on chemo, presented from home with poor oral intake and hypoxia on ambulation when checked by his home health RN. Chest x-ray on admission showed COPD changes with bibasilar atelectasis, COVID-19 PCR was negative. Recent hospitalization last month for suspected sepsis from UTI. During this admission, pt was managed for HCAP, currently improved, but continues to have severe anorexia, very poor p.o. intake and severe malnutrition, multiple previous providers had discussions with daughter, [name], palliative care was consulted, daughter continued to insist on PEG tube placement. PEG tube placement done by IR on 05/25/19.    Today, pt denies any new complaints. Mentation at baseline, poor insight.   Hospital Course:  Principal Problem:   HCAP (healthcare-associated pneumonia) Active Problems:  Dementia without behavioral disturbance ([place])   Stage III squamous cell carcinoma of right lung (HCC)   Protein-calorie malnutrition, severe (HCC)   Anorexia   Pressure injury of skin  HCAP versus postobstructive pneumonia Currently afebrile with no leukocytosis Pt with right middle lobe obstructing mass Initially treated with IV vancomycin and cefepime on admission, subsequently vancomycin was discontinued after 48 hours and cefepime after 4 to 5 days Follow up with PCP  Non-small cell cancer-diagnosed in July 2020 Most recent CT was in August 2020 and revealed a spiculated perihilar mass with obstruction of the right middle lobe and pretracheal enlarged lymph nodes Pt has received 8 cycles of chemotherapy Dr. Julien Nordmann, continue outpt follow up Staging CT done Follow up Oncology as outpt  Severe protein-calorie malnutrition, severe anorexia, poor p.o. intake His daughter-[name] Blowe had multiple discussions with my prior partners and continued to insist on feeding tube placement Palliative team on board PEG tube placed per IR Dietician consulted for tube feedings, started 05/26/19, monitored electrolytes for about 3 days as pt was at risk for refeeding syndrome given severe malnutrition. Dietitian recommended bolus feeding upon discharge. HHRN ordered. Daughter also will be educated on how to feed patient Follow up with PCP  Paroxysmal A-fib Not on any rate controlling agent at home May need to start BB if HR is uncontrolled Continue Xarelto  Dementia Remains pleasantly confused, says a lot of curse words No behavioral problems at this time  History of PE in August 2020 Continue Xarelto           Malnutrition Type:  Nutrition Problem: Increased nutrient needs Etiology: chronic illness, cancer and cancer related treatments, acute illness   Malnutrition Characteristics:  Signs/Symptoms: estimated needs   Nutrition Interventions:  Interventions: Prostat,  MVI, Ensure Enlive (each supplement provides 350kcal and 20 grams of protein)   Estimated body mass index is 17.24 kg/m as calculated from the following:   Height as of this encounter: 6\' 2"  (1.88 m).   Weight as of this encounter: 60.9 kg.    Procedures:  PEG tube placement  Consultations:  IR   Oncology  Palliative  Discharge Exam: BP 131/80 (BP Location: Right Arm)   Pulse 91   Temp 97.7 F (36.5 C) (Oral)   Resp 15   Ht 6\' 2"  (1.88 m)   Wt 60.9 kg   SpO2 100%   BMI 17.24 kg/m   General: NAD Cardiovascular: S1, S2 present  Respiratory: CTAB  Discharge Instructions You were cared for by a hospitalist during your hospital stay. If you have any questions about your discharge medications or the care you received while you were in the hospital after you are discharged, you can call the unit and asked to speak with the hospitalist on call if the hospitalist that took care of you is not available. Once you are discharged, your primary care physician will handle any further medical issues. Please note that NO REFILLS for any discharge medications will be authorized once you are discharged, as it is imperative that you return to your primary care physician (or establish a relationship with a primary care physician if you do not have one) for your aftercare needs so that they can reassess your need for medications and monitor your lab values.  Discharge Instructions    Diet - low sodium heart healthy   Complete by: As directed    Increase activity slowly   Complete by: As directed      Allergies as of 05/30/2019   No Known Allergies     Medication List    STOP taking these medications   prochlorperazine 10 MG tablet Commonly known as: COMPAZINE     TAKE these medications   feeding supplement (OSMOLITE 1.5 CAL) Liqd Place 237 mLs into feeding tube 4 (four) times daily.   feeding supplement (PRO-STAT SUGAR FREE 64) Liqd Place 30 mLs into feeding tube daily. Start  taking on: May 31, 2019   ferrous sulfate 325 (65 FE) MG tablet Take 325 mg by mouth 2 (two) times daily with a meal.   Fish Oil 1200 MG Caps Take 1,200 mg by mouth daily.   free water Soln Place 100 mLs into feeding tube every 6 (six) hours.   multivitamin tablet Take 1 tablet by mouth daily.   rivaroxaban 20 MG Tabs tablet Commonly known as: XARELTO Take 1 tablet (20 mg total) by mouth daily with supper.            Durable Medical Equipment  (From admission, onward)         Start     Ordered   05/30/19 1520  For home use only DME Tube feeding  Once    Comments: Osmolite 1.5, 237 ml(1 carton) 4 times daily via PEG Free water flush of 30 ml before and after each bolus  -Advance as tolerated to goal regimen of 474 ml (2 cartons) of Osmolite 1.5 TID via PEG -Free water flushes of 30 ml before and after each bolus -Will need additional free water flush of 200 ml every 8 hours (600 ml) -This provides 2130 kcals, 89g protein and 1866 ml H2O daily   05/30/19 1520  No Known Allergies Follow-up Information    [name], NP Follow up in 1 week(s).   Specialty: Internal Medicine Contact information: [place] [address] [phone]        Jerline Pain, MD .   Specialty: Cardiology Contact information: [phone] [address] [place] [address] [phone]            The results of significant diagnostics from this hospitalization (including imaging, microbiology, ancillary and laboratory) are listed below for reference.    Significant Diagnostic Studies: CT CHEST W CONTRAST  Result Date: 05/26/2019 CLINICAL DATA:  Restaging lung cancer. Status post chemotherapy and radiation therapy. EXAM: CT CHEST WITH CONTRAST TECHNIQUE: Multidetector CT imaging of the chest was performed during intravenous contrast administration. CONTRAST:  36mL OMNIPAQUE IOHEXOL 300 MG/ML  SOLN COMPARISON:  02/05/2019  FINDINGS: Cardiovascular: The heart is normal in size. No pericardial effusion. Stable tortuosity, ectasia and calcification of the thoracic aorta. No dissection. Stable coronary artery calcifications and probable stents. Mild pulmonary artery enlargement suggesting pulmonary hypertension. Mediastinum/Nodes: No discrete right hilar adenopathy. Stable small scattered mediastinal lymph nodes. 7 mm precarinal lymph node is unchanged. 8 mm AP window node is stable. No subcarinal adenopathy. Lungs/Pleura: The large necrotic right hilar mass is now a largely cystic/cavitary area with minimal residual soft tissue density laterally and posteriorly. This would suggest a good response to treatment. There is persistent complete right middle lobe atelectasis and bronchiectasis. Stable underlying severe changes of pulmonary emphysema with extensive pulmonary scarring changes. 6 mm right upper lobe pulmonary nodule on image 47/7 probably obscured on the prior study by a pleural effusion. 6.5 mm nodular lesion in the right upper lobe on image number 74/7. I do not see this for certain on the prior study but may have been obscured. 4 mm left lower lobe pulmonary nodule on image 112/7, unchanged. Resolution of pleural effusions seen on the prior study. Upper Abdomen: No significant upper abdominal findings. No hepatic or adrenal gland lesions are identified. Musculoskeletal: No chest wall mass, supraclavicular or axillary adenopathy. The thyroid gland appears normal. Moderately heterogeneous appearance of the bone marrow without discrete lesions. IMPRESSION: 1. The large right hilar lesion is now a thin walled cavitary lesion suggesting a good response to treatment. 2. Stable mediastinal and hilar lymph nodes. 3. Persistent complete right middle lobe atelectasis and bronchiectasis. 4. Three pulmonary lesions as detailed above. Recommend continued surveillance. 5. Severe emphysematous changes and pulmonary scarring. 6. No findings  for upper abdominal metastatic disease. Aortic Atherosclerosis (ICD10-I70.0) and Emphysema (ICD10-J43.9). Electronically Signed   By: Marijo Sanes M.D.   On: 05/26/2019 13:29   IR GASTROSTOMY TUBE MOD SED  Result Date: 05/25/2019 INDICATION: 78 year old with malnutrition. Request for gastrostomy tube placement. EXAM: PERCUTANEOUS GASTROSTOMY TUBE WITH FLUOROSCOPIC GUIDANCE Physician: Stephan Minister. Anselm Pancoast, MD MEDICATIONS: Ancef 2 g; Antibiotics were administered within 1 hour of the procedure. Glucagon 0.5 mg IV ANESTHESIA/SEDATION: Versed 2.0 mg IV; Fentanyl 50 mcg IV Moderate Sedation Time:  18 minutes The patient was continuously monitored during the procedure by the interventional radiology nurse under my direct supervision. FLUOROSCOPY TIME:  Fluoroscopy Time: 6 minutes, 12 mGy COMPLICATIONS: None immediate. PROCEDURE: Informed consent was obtained for a percutaneous gastrostomy tube. The patient was placed on the interventional table. An orogastric tube was placed with fluoroscopic guidance. The anterior abdomen was prepped and draped in sterile fashion. Maximal barrier sterile technique was utilized including caps, mask, sterile gowns, sterile gloves, sterile drape, hand hygiene and  skin antiseptic. Stomach was inflated with air through the orogastric tube. The skin and subcutaneous tissues were anesthetized with 1% lidocaine. A 17 gauge needle was directed into the distended stomach with fluoroscopic guidance. A wire was advanced into the stomach and a T-tact was deployed. A 9-French vascular sheath was placed and the orogastric tube was snared using a Gooseneck snare device. The orogastric tube and snare were pulled out of the patient's mouth. The snare device was connected to a 20-French gastrostomy tube. The snare device and gastrostomy tube were pulled through the patient's mouth and out the anterior abdominal wall. The gastrostomy tube was cut to an appropriate length. Contrast injection through gastrostomy  tube confirmed placement within the stomach. Fluoroscopic images were obtained for documentation. The gastrostomy tube was flushed with normal saline. IMPRESSION: Successful fluoroscopic guided percutaneous gastrostomy tube placement. Electronically Signed   By: Markus Daft M.D.   On: 05/25/2019 09:46   DG CHEST PORT 1 VIEW  Result Date: 05/23/2019 CLINICAL DATA:  Follow-up right lower lobe pneumonia. History of non-small cell lung cancer. EXAM: PORTABLE CHEST 1 VIEW COMPARISON:  05/18/2019 and 05/08/2019 FINDINGS: Persistent hazy and patchy densities in the right hilum and right lower lung. Findings have minimally changed since 05/18/2019. Slightly prominent interstitial densities in the right lung compared to the left. Left lung remains clear. Heart and mediastinum are within normal limits. Atherosclerotic calcifications at the aortic arch. IMPRESSION: No significant change in the hazy and patchy densities in the right lower lung and right hilar region. Findings could represent a combination of patient's known malignancy with underlying infection/inflammation. Electronically Signed   By: Markus Daft M.D.   On: 05/23/2019 08:51   DG Chest Port 1 View  Result Date: 05/18/2019 CLINICAL DATA:  Failure to thrive EXAM: PORTABLE CHEST 1 VIEW COMPARISON:  Portable exam 1522 hours compared to 05/08/2019 FINDINGS: Normal heart size, mediastinal contours, and pulmonary vascularity. Atherosclerotic calcification aorta. Emphysematous changes and bronchitic changes consistent with COPD. Persistent opacity at RIGHT lung base question pneumonia. Radiographic follow-up until resolution recommended to exclude underlying abnormalities including tumor. Minimal atelectasis or infiltrate at LEFT base. No pleural effusion or pneumothorax. Bones demineralized. IMPRESSION: COPD changes with with bibasilar atelectasis versus infiltrate much greater on RIGHT; radiographic follow-up until resolution recommended to exclude underlying  abnormalities including tumor. Aortic Atherosclerosis (ICD10-I70.0). Electronically Signed   By: Lavonia Dana M.D.   On: 05/18/2019 15:46   DG Chest Port 1 View  Result Date: 05/08/2019 CLINICAL DATA:  Cough and fever EXAM: PORTABLE CHEST 1 VIEW COMPARISON:  03/14/2019 FINDINGS: The lung bases were not fully visualized on this exam. Again noted is a large mass within the right middle lobe with complete collapse of the right middle lobe. This appearance is similar to prior chest x-ray dated 03/14/2019 with some proved aeration in the right middle lobe. The heart size is stable from prior study. There is elevation of the right hemidiaphragm. There is mild shift of the mediastinum to the right. IMPRESSION: 1. Limited study. The lung bases were not fully visualized on this exam. 2. Again noted is a right middle lobe mass with near complete collapse of the right middle lobe. 3. No definite focal infiltrate identified on this exam given the limitations described above. Electronically Signed   By: Constance Holster M.D.   On: 05/08/2019 15:19    Microbiology: No results found for this or any previous visit (from the past 240 hour(s)).   Labs: Basic Metabolic Panel: Recent Labs  Lab 05/25/19 0403 05/26/19 1256 05/26/19 1602 05/27/19 0552 05/27/19 1655 05/28/19 0604 05/29/19 0512 05/30/19 1010  NA 140  --   --  143  --  142 141 140  K 4.0  --   --  4.0  --  3.8 4.5 4.8  CL 105  --   --  104  --  105 106 104  CO2 23  --   --  25  --  26 26 25   GLUCOSE 102*  --   --  143*  --  128* 140* 107*  BUN 15  --   --  16  --  16 15 14   CREATININE 0.66  --   --  0.60*  --  0.54* 0.51* 0.52*  CALCIUM 9.4  --   --  9.5  --  9.2 9.4 9.7  MG  --  1.9 2.1 2.0 2.0  --   --   --   PHOS  --  3.3 3.4 3.1 3.0  --   --   --    Liver Function Tests: Recent Labs  Lab 05/25/19 0403  AST 18  ALT 12  ALKPHOS 75  BILITOT 0.6  PROT 7.2  ALBUMIN 2.4*   No results for input(s): LIPASE, AMYLASE in the last 168  hours. No results for input(s): AMMONIA in the last 168 hours. CBC: Recent Labs  Lab 05/25/19 0403 05/27/19 0552 05/28/19 0604 05/29/19 0512 05/30/19 1010  WBC 4.9 5.1 6.2 5.5 6.3  NEUTROABS 3.2 3.6 4.5 4.1 4.6  HGB 10.8* 11.0* 9.8* 10.5* 10.7*  HCT 34.5* 35.3* 30.9* 34.2* 34.8*  MCV 64.2* 64.2* 63.4* 64.2* 65.2*  PLT 450* 399 414* 396 417*   Cardiac Enzymes: No results for input(s): CKTOTAL, CKMB, CKMBINDEX, TROPONINI in the last 168 hours. BNP: BNP (last 3 results) No results for input(s): BNP in the last 8760 hours.  ProBNP (last 3 results) No results for input(s): PROBNP in the last 8760 hours.  CBG: Recent Labs  Lab 05/29/19 1608 05/29/19 1924 05/30/19 0331 05/30/19 0746 05/30/19 1258  GLUCAP 124* 105* 108* 102* 102*       Signed:  Alma Friendly, MD [facility] 05/30/2019, 3:36 PM

## 2019-05-30 NOTE — Discharge Instructions (Signed)
Tube feeding regimen: Osmolite 1.5, 237 ml(1 carton) 4 times daily via PEG Free water flush of 30 ml before and after each bolus  -Advance as tolerated to goal regimen of 474 ml (2 cartons) of Osmolite 1.5 TID via PEG -Will need additional free water flush of 200 ml every 8 hours (600 ml)

## 2019-05-31 ENCOUNTER — Telehealth: Payer: Self-pay | Admitting: *Deleted

## 2019-05-31 NOTE — Telephone Encounter (Signed)
Patients daughter [name] called regarding tomorrow's appt.  Stated she is physically unable to get patient into a car and into his appt tomorrow.  He has since been hospitalized twice and is now back home.  Home health nurse is coming to evaluate patient tomorrow.    Routed to PA to see if she would be agreeable to phone visit or consider waiting until home health nurse assessed patient at [place] visit before keeping appt in office with [place].    Pending advice.

## 2019-05-31 NOTE — Telephone Encounter (Signed)
Per Cassie Heilingoetter, PA ok to let Home health nurse go out to patients home tomorrow and do an assessment.  She is asked to call us with a report.  Per Daughter she is requesting that that occur before we reschedule his visit with [place].  Also requested that we cancel his visit for tomorrow.

## 2019-06-01 ENCOUNTER — Ambulatory Visit: Payer: Medicare HMO | Admitting: Physician Assistant

## 2019-06-01 ENCOUNTER — Telehealth: Payer: Self-pay | Admitting: Medical Oncology

## 2019-06-01 DIAGNOSIS — C3490 Malignant neoplasm of unspecified part of unspecified bronchus or lung: Secondary | ICD-10-CM | POA: Diagnosis not present

## 2019-06-01 DIAGNOSIS — C342 Malignant neoplasm of middle lobe, bronchus or lung: Secondary | ICD-10-CM | POA: Diagnosis not present

## 2019-06-01 DIAGNOSIS — R590 Localized enlarged lymph nodes: Secondary | ICD-10-CM | POA: Diagnosis not present

## 2019-06-01 DIAGNOSIS — E785 Hyperlipidemia, unspecified: Secondary | ICD-10-CM | POA: Diagnosis not present

## 2019-06-01 DIAGNOSIS — Z7901 Long term (current) use of anticoagulants: Secondary | ICD-10-CM | POA: Diagnosis not present

## 2019-06-01 DIAGNOSIS — E43 Unspecified severe protein-calorie malnutrition: Secondary | ICD-10-CM | POA: Diagnosis not present

## 2019-06-01 DIAGNOSIS — R69 Illness, unspecified: Secondary | ICD-10-CM | POA: Diagnosis not present

## 2019-06-01 DIAGNOSIS — I10 Essential (primary) hypertension: Secondary | ICD-10-CM | POA: Diagnosis not present

## 2019-06-01 NOTE — Telephone Encounter (Signed)
Pt missed appt today  Seen by [facility] nurse -  BP=110/62 -p=104 ,resp -16  97%,  temp =97.4

## 2019-06-03 ENCOUNTER — Telehealth: Payer: Self-pay | Admitting: Physician Assistant

## 2019-06-03 NOTE — Telephone Encounter (Signed)
R/s appt per 12/17 sch message - pt aware of appt date and time

## 2019-06-06 ENCOUNTER — Telehealth: Payer: Self-pay | Admitting: Medical Oncology

## 2019-06-06 ENCOUNTER — Telehealth: Payer: Self-pay | Admitting: *Deleted

## 2019-06-06 ENCOUNTER — Other Ambulatory Visit: Payer: Self-pay | Admitting: Physician Assistant

## 2019-06-06 DIAGNOSIS — I2699 Other pulmonary embolism without acute cor pulmonale: Secondary | ICD-10-CM

## 2019-06-06 MED ORDER — RIVAROXABAN 20 MG PO TABS: 20.0000 mg | ORAL_TABLET | Freq: Every day | ORAL | 0 refills | Status: DC

## 2019-06-06 NOTE — Telephone Encounter (Signed)
CALLED PATIENT TO [facility] FU APPT. ON 06-13-19 PER REQUEST OF JILL, RN FOR DR. KINARD, SPOKE WITH PATIENT'S DAUGHTER- [name] AND SHE SAID THAT IT WOULD DEPEND ON WHAT DR. MOHAMMED SAID ON 06-09-19, PATIENT'S DAUGHTER- [name] TO LET ME KNOW ONCE THEY HAVE SPOKE WITH DR. MOHAMMED

## 2019-06-06 NOTE — Telephone Encounter (Signed)
Needs xarelto refill.

## 2019-06-07 DIAGNOSIS — R69 Illness, unspecified: Secondary | ICD-10-CM | POA: Diagnosis not present

## 2019-06-07 DIAGNOSIS — R590 Localized enlarged lymph nodes: Secondary | ICD-10-CM | POA: Diagnosis not present

## 2019-06-07 DIAGNOSIS — E785 Hyperlipidemia, unspecified: Secondary | ICD-10-CM | POA: Diagnosis not present

## 2019-06-07 DIAGNOSIS — Z7901 Long term (current) use of anticoagulants: Secondary | ICD-10-CM | POA: Diagnosis not present

## 2019-06-07 DIAGNOSIS — Z431 Encounter for attention to gastrostomy: Secondary | ICD-10-CM | POA: Diagnosis not present

## 2019-06-07 DIAGNOSIS — C342 Malignant neoplasm of middle lobe, bronchus or lung: Secondary | ICD-10-CM | POA: Diagnosis not present

## 2019-06-07 DIAGNOSIS — E43 Unspecified severe protein-calorie malnutrition: Secondary | ICD-10-CM | POA: Diagnosis not present

## 2019-06-07 DIAGNOSIS — I10 Essential (primary) hypertension: Secondary | ICD-10-CM | POA: Diagnosis not present

## 2019-06-07 DIAGNOSIS — I4891 Unspecified atrial fibrillation: Secondary | ICD-10-CM | POA: Diagnosis not present

## 2019-06-07 MED FILL — XARELTO 20 MG TABLET: 20 | 30 days supply | Qty: 30 | Fill #0

## 2019-06-09 ENCOUNTER — Encounter: Payer: Self-pay | Admitting: Physician Assistant

## 2019-06-09 ENCOUNTER — Inpatient Hospital Stay (HOSPITAL_BASED_OUTPATIENT_CLINIC_OR_DEPARTMENT_OTHER): Payer: Medicare HMO | Admitting: Physician Assistant

## 2019-06-09 DIAGNOSIS — Z7189 Other specified counseling: Secondary | ICD-10-CM

## 2019-06-09 DIAGNOSIS — C3491 Malignant neoplasm of unspecified part of right bronchus or lung: Secondary | ICD-10-CM

## 2019-06-09 DIAGNOSIS — E43 Unspecified severe protein-calorie malnutrition: Secondary | ICD-10-CM | POA: Diagnosis not present

## 2019-06-09 NOTE — Progress Notes (Signed)
[place] HEMATOLOGY-ONCOLOGY TeleHEALTH VISIT PROGRESS NOTE   I connected with Richard [name] on 06/09/19 at  2:00 PM EST by telephone and verified that I am speaking with the correct person using two identifiers.  I discussed the limitations, risks, security and privacy concerns of performing an evaluation and management service by telemedicine and the availability of in-person appointments. I also discussed with the patient that there may be a patient responsible charge related to this service. The patient expressed understanding and agreed to proceed.  Other persons participating in the visit and their role in the encounter: Richard [name] (daughter)   Patient's location: Home Provider's location: Office  [name], NP [id number] [id number]  DIAGNOSIS: Stage IIIA(T3, N2, M0) non-small cell lung cancer, squamous cell carcinoma presented with large right middle lobe lung mass with postobstructive pneumonia as well as right hilar and mediastinal lymphadenopathy diagnosed in July 2020. He also has a suspicious small right pleural effusion and small indeterminate metabolic focus in the liver.  PRIOR THERAPY: Concurrent chemoradiation with Carboplatin for an AUC of 2 and Paclitaxel 45 mg/m2.Status post 9cycles. Last dose 05/02/2019  CURRENT THERAPY: Observation  INTERVAL HISTORY: Richard [name] 78 y.o. male returns to the clinic for a follow-up visit.  The patient recently completed his last cycle of concurrent chemoradiation.  In the interval since his last appointment, the patient was hospitalized from 11/22-11/24 and then again on 12/2-12/14 for the chief complaint of healthcare associated pneumonia, anorexia, weakness.  He received IV antibiotics while in the hospital.  Regarding his anorexia, the patient had a PEG tube placed on 12/9.  Since being discharged in the hospital, the patient has been gradually getting stronger; however, the patient's daughter  states that he does spends most the day in bed. His family states that he ambulated himself to the rest room a few days ago which was a step in the right direction. Home health comes to the house once per week for assistance.  The patient denies any recent fever, chills, or night sweats.  He is currently taking 4 bottles of supplemental nutriention per day.  He reports his baseline dyspnea on exertion which is nothing unusual for him.  He denies any chest pain, wheezing, or hemoptysis.  He denies any nausea, vomiting, diarrhea, or constipation.  He denies any headache or visual changes.  The patient recently had a restaging CT scan performed.  The purpose of today's telephone visit is to discuss his restaging CT scan and current condition and treatment options.  MEDICAL HISTORY: Past Medical History:  Diagnosis Date  . Dementia ([place])   . High cholesterol   . Hypertension   . Lung mass 12/2018    ALLERGIES:  has No Known Allergies.  MEDICATIONS:  Current Outpatient Medications  Medication Sig Dispense Refill  . Amino Acids-Protein Hydrolys (FEEDING SUPPLEMENT, PRO-STAT SUGAR FREE 64,) LIQD Place 30 mLs into feeding tube daily. 887 mL 0  . ferrous sulfate 325 (65 FE) MG tablet Take 325 mg by mouth 2 (two) times daily with a meal.    . Multiple Vitamin (MULTIVITAMIN) tablet Take 1 tablet by mouth daily.    . Nutritional Supplements (FEEDING SUPPLEMENT, OSMOLITE 1.5 CAL,) LIQD Place 237 mLs into feeding tube 4 (four) times daily.  0  . Omega-3 Fatty Acids (FISH OIL) 1200 MG CAPS Take 1,200 mg by mouth daily.    . rivaroxaban (XARELTO) 20 MG TABS tablet Take 1 tablet (20 mg total) by mouth daily with  supper. 30 tablet 0  . Water For Irrigation, Sterile (FREE WATER) SOLN Place 100 mLs into feeding tube every 6 (six) hours.     No current facility-administered medications for this visit.    SURGICAL HISTORY:  Past Surgical History:  Procedure Laterality Date  . IR GASTROSTOMY TUBE MOD SED   05/25/2019  . JOINT REPLACEMENT    . VIDEO BRONCHOSCOPY WITH ENDOBRONCHIAL ULTRASOUND N/A 12/16/2018   Procedure: VIDEO BRONCHOSCOPY WITH ENDOBRONCHIAL ULTRASOUND AND FLUROSCOPY;  Surgeon: [name], MD;  Location: [facility];  Service: Pulmonary;  Laterality: N/A;    REVIEW OF SYSTEMS:   Review of Systems  Constitutional: Positive for fatigue, generalized weakness (improved), and appetite tube (has PEG tube). Negative for chills and fever  HENT: Negative for mouth sores, nosebleeds, sore throat and trouble swallowing.   Eyes: Negative for eye problems and icterus.  Respiratory: Positive for dyspnea on exertion (improved since discharged from hospital). Negative for hemoptysis and wheezing.  Cardiovascular: Negative for chest pain and leg swelling.  Gastrointestinal: Negative for abdominal pain, constipation, diarrhea, nausea and vomiting.  Genitourinary: Positive for bladder incontinence. Negative for difficulty urinating, dysuria, frequency and hematuria.   Musculoskeletal: Negative for back pain, gait problem, neck pain and neck stiffness.  Skin: Negative for itching and rash.  Neurological: Negative for dizziness, gait problem, headaches, light-headedness and seizures.  Hematological: Negative for adenopathy. Does not bruise/bleed easily.  Psychiatric/Behavioral: Positive for dementia    PHYSICAL EXAMINATION:  There were no vitals taken for this visit.  ECOG PERFORMANCE STATUS: 3 - Symptomatic, >50% confined to bed  LABORATORY DATA: Lab Results  Component Value Date   WBC 6.3 05/30/2019   HGB 10.7 (L) 05/30/2019   HCT 34.8 (L) 05/30/2019   MCV 65.2 (L) 05/30/2019   PLT 417 (H) 05/30/2019      Chemistry      Component Value Date/Time   NA 140 05/30/2019 1010   NA 145 (H) 10/29/2016 1513   K 4.8 05/30/2019 1010   CL 104 05/30/2019 1010   CO2 25 05/30/2019 1010   BUN 14 05/30/2019 1010   BUN 12 10/29/2016 1513   CREATININE 0.52 (L) 05/30/2019 1010   CREATININE 0.78  05/02/2019 0825      Component Value Date/Time   CALCIUM 9.7 05/30/2019 1010   ALKPHOS 75 05/25/2019 0403   AST 18 05/25/2019 0403   AST 19 05/02/2019 0825   ALT 12 05/25/2019 0403   ALT 18 05/02/2019 0825   BILITOT 0.6 05/25/2019 0403   BILITOT 0.3 05/02/2019 0825       RADIOGRAPHIC STUDIES:  CT CHEST W CONTRAST  Result Date: 05/26/2019 CLINICAL DATA:  Restaging lung cancer. Status post chemotherapy and radiation therapy. EXAM: CT CHEST WITH CONTRAST TECHNIQUE: Multidetector CT imaging of the chest was performed during intravenous contrast administration. CONTRAST:  58mL OMNIPAQUE IOHEXOL 300 MG/ML  SOLN COMPARISON:  02/05/2019 FINDINGS: Cardiovascular: The heart is normal in size. No pericardial effusion. Stable tortuosity, ectasia and calcification of the thoracic aorta. No dissection. Stable coronary artery calcifications and probable stents. Mild pulmonary artery enlargement suggesting pulmonary hypertension. Mediastinum/Nodes: No discrete right hilar adenopathy. Stable small scattered mediastinal lymph nodes. 7 mm precarinal lymph node is unchanged. 8 mm AP window node is stable. No subcarinal adenopathy. Lungs/Pleura: The large necrotic right hilar mass is now a largely cystic/cavitary area with minimal residual soft tissue density laterally and posteriorly. This would suggest a good response to treatment. There is persistent complete right middle lobe atelectasis and bronchiectasis. Stable  underlying severe changes of pulmonary emphysema with extensive pulmonary scarring changes. 6 mm right upper lobe pulmonary nodule on image 47/7 probably obscured on the prior study by a pleural effusion. 6.5 mm nodular lesion in the right upper lobe on image number 74/7. I do not see this for certain on the prior study but may have been obscured. 4 mm left lower lobe pulmonary nodule on image 112/7, unchanged. Resolution of pleural effusions seen on the prior study. Upper Abdomen: No significant upper  abdominal findings. No hepatic or adrenal gland lesions are identified. Musculoskeletal: No chest wall mass, supraclavicular or axillary adenopathy. The thyroid gland appears normal. Moderately heterogeneous appearance of the bone marrow without discrete lesions. IMPRESSION: 1. The large right hilar lesion is now a thin walled cavitary lesion suggesting a good response to treatment. 2. Stable mediastinal and hilar lymph nodes. 3. Persistent complete right middle lobe atelectasis and bronchiectasis. 4. Three pulmonary lesions as detailed above. Recommend continued surveillance. 5. Severe emphysematous changes and pulmonary scarring. 6. No findings for upper abdominal metastatic disease. Aortic Atherosclerosis (ICD10-I70.0) and Emphysema (ICD10-J43.9). Electronically Signed   By: Marijo Sanes M.D.   On: 05/26/2019 13:29   IR GASTROSTOMY TUBE MOD SED  Result Date: 05/25/2019 INDICATION: 78 year old with malnutrition. Request for gastrostomy tube placement. EXAM: PERCUTANEOUS GASTROSTOMY TUBE WITH FLUOROSCOPIC GUIDANCE Physician: Stephan Minister. Anselm Pancoast, MD MEDICATIONS: Ancef 2 g; Antibiotics were administered within 1 hour of the procedure. Glucagon 0.5 mg IV ANESTHESIA/SEDATION: Versed 2.0 mg IV; Fentanyl 50 mcg IV Moderate Sedation Time:  18 minutes The patient was continuously monitored during the procedure by the interventional radiology nurse under my direct supervision. FLUOROSCOPY TIME:  Fluoroscopy Time: 6 minutes, 12 mGy COMPLICATIONS: None immediate. PROCEDURE: Informed consent was obtained for a percutaneous gastrostomy tube. The patient was placed on the interventional table. An orogastric tube was placed with fluoroscopic guidance. The anterior abdomen was prepped and draped in sterile fashion. Maximal barrier sterile technique was utilized including caps, mask, sterile gowns, sterile gloves, sterile drape, hand hygiene and skin antiseptic. Stomach was inflated with air through the orogastric tube. The skin and  subcutaneous tissues were anesthetized with 1% lidocaine. A 17 gauge needle was directed into the distended stomach with fluoroscopic guidance. A wire was advanced into the stomach and a T-tact was deployed. A 9-French vascular sheath was placed and the orogastric tube was snared using a Goos[place]ck snare device. The orogastric tube and snare were pulled out of the patient's mouth. The snare device was connected to a 20-French gastrostomy tube. The snare device and gastrostomy tube were pulled through the patient's mouth and out the anterior abdominal wall. The gastrostomy tube was cut to an appropriate length. Contrast injection through gastrostomy tube confirmed placement within the stomach. Fluoroscopic images were obtained for documentation. The gastrostomy tube was flushed with normal saline. IMPRESSION: Successful fluoroscopic guided percutaneous gastrostomy tube placement. Electronically Signed   By: Markus Daft M.D.   On: 05/25/2019 09:46   DG CHEST PORT 1 VIEW  Result Date: 05/23/2019 CLINICAL DATA:  Follow-up right lower lobe pneumonia. History of non-small cell lung cancer. EXAM: PORTABLE CHEST 1 VIEW COMPARISON:  05/18/2019 and 05/08/2019 FINDINGS: Persistent hazy and patchy densities in the right hilum and right lower lung. Findings have minimally changed since 05/18/2019. Slightly prominent interstitial densities in the right lung compared to the left. Left lung remains clear. Heart and mediastinum are within normal limits. Atherosclerotic calcifications at the aortic arch. IMPRESSION: No significant change in the hazy and patchy  densities in the right lower lung and right hilar region. Findings could represent a combination of patient's known malignancy with underlying infection/inflammation. Electronically Signed   By: Markus Daft M.D.   On: 05/23/2019 08:51   DG Chest Port 1 View  Result Date: 05/18/2019 CLINICAL DATA:  Failure to thrive EXAM: PORTABLE CHEST 1 VIEW COMPARISON:  Portable exam  1522 hours compared to 05/08/2019 FINDINGS: Normal heart size, mediastinal contours, and pulmonary vascularity. Atherosclerotic calcification aorta. Emphysematous changes and bronchitic changes consistent with COPD. Persistent opacity at RIGHT lung base question pneumonia. Radiographic follow-up until resolution recommended to exclude underlying abnormalities including tumor. Minimal atelectasis or infiltrate at LEFT base. No pleural effusion or pneumothorax. Bones demineralized. IMPRESSION: COPD changes with with bibasilar atelectasis versus infiltrate much greater on RIGHT; radiographic follow-up until resolution recommended to exclude underlying abnormalities including tumor. Aortic Atherosclerosis (ICD10-I70.0). Electronically Signed   By: Lavonia Dana M.D.   On: 05/18/2019 15:46     ASSESSMENT/PLAN:  This is a very pleasant 78 year old african [place] male with atleast stage IIIA(T3, N2, M0) non-small cell lung cancer, squamous cell carcinoma presented with large right middle lobe lung mass with postobstructive pneumonia as well as right hilar and mediastinal lymphadenopathy diagnosed in July 2020. He also has a suspicious small right pleural effusion and small indeterminate metabolic focus in the liver.  Patient completed 9 cycles of concurrent chemoradiation with carboplatin for an AUC of 2 and paclitaxel 45 mg/m.  His treatment was interrupted after the second cycle of treatment secondary to hospitalization for pneumonia and a pulmonary embolism.  He restarted treatment on 02-22-2019.   The patient was recently hospitalized for weakness, anorexia, dementia, and healthcare acquired pneumonia.  He is feeling a little bit stronger since being discharged from the hospital, although he reportedly spends most of the day in bed.   The patient recently had a restaging CT scan performed.  Dr. Julien Nordmann personally and independently reviewed the scan and discussed results with the patient today.  The scan  showed a positive response to treatment with improvement in the large right hilar lesion.   Dr. Julien Nordmann had a lengthy discussion with the patient and his daughter about his current condition and treatment options.  Given the patient's current condition and performance status, Dr. Julien Nordmann believes that the patient is not a good  candidate for further treatment at this time. Dr. Julien Nordmann recommends continuing on observation with close monitoring on subsequent CT scans. The patient and his daughter are in agreement with this plan.   I will arrange for the patient to have a restaging CT scan performed in 3 months.  We will see the patient back at that time for evaluation and to review his scan results.  I have placed a referral to the social worker to evaluate this patient's needs at the patient's daughter's request.  He will continue with the nutritional recommendations outlined by the dietitian team while in the hospital.   I discussed the assessment and treatment plan with the patient. The patient was provided an opportunity to ask questions and all were answered. The patient agreed with the plan and demonstrated an understanding of the instructions.  The patient was advised to call back or seek an in-person evaluation if the symptoms worsen or if the condition fails to improve as anticipated.  I provided 15 minutes of non face-to-face telephone visit time during this encounter, and > 50% was spent counseling as documented under my assessment & plan.  Pietra Zuluaga L  Rogelio Winbush, PA-C 06/09/2019 2:37 PM  Orders Placed This Encounter  Procedures  . CT Chest W Contrast    Standing Status:   Future    Standing Expiration Date:   06/08/2020    Order Specific Question:   ** REASON FOR EXAM (FREE TEXT)    Answer:   Restaging Lung Cancer    Order Specific Question:   If indicated for the ordered procedure, I authorize the administration of contrast media per Radiology protocol    Answer:   Yes    Order  Specific Question:   Preferred imaging location?    Answer:   [facility]    Order Specific Question:   Radiology Contrast Protocol - do NOT remove file path    Answer:   [email or web address]  . CBC with Differential ([facility] Only)    Standing Status:   Future    Standing Expiration Date:   06/08/2020  . CMP ([facility] only)    Standing Status:   Future    Standing Expiration Date:   06/08/2020  . Ambulatory referral to Social Work    Referral Priority:   Routine    Referral Type:   Consultation    Referral Reason:   Specialty Services Required    Number of Visits Requested:   [place], PA-C 06/09/19  ADDENDUM: Hematology/Oncology Attending: I joined the telephone virtual visit with the patient and his daughter today.  I recommended his care plan.  This is a very pleasant 78 years old African-American male with a stage IIIa non-small cell lung cancer, squamous cell carcinoma. The patient was treated with a course of concurrent chemoradiation with weekly carboplatin and paclitaxel and he tolerated the treatment well except for fatigue as well as weight loss.  He was recently admitted to the hospital and treated for questionable pneumonia as well as malnutrition.  He has a PEG tube placed for supplemental nutrition and he is followed by the advanced home care. The patient had repeat CT scan of the chest performed recently.  I personally and independently reviewed the scans and discussed the results with the patient and his daughter. His scan showed improvement of his disease and no concerning findings for metastasis. I had a lengthy discussion with the patient and his daughter today about his current condition and treatment options.  Unfortunately with his current performance status the patient is not a good candidate for any additional systemic therapy at this point.  I would have consider him for consolidation treatment with  immunotherapy but his poor performance status would not be appropriate for this treatment at least for now. I recommended for the patient to continue on observation with repeat CT scan of the chest in 3 months for restaging of his disease. For the malnutrition, he will continue with his supplemental nutrition and we will refer the patient to the social worker at the cancer center for help with his current nutritional supplements which is costly for the patient and his family at this point. The patient and his daughter were advised to call immediately if he has any concerning symptoms in the interval.  Disclaimer: This note was dictated with voice recognition software. Similar sounding words can inadvertently be transcribed and may be missed upon review. Eilleen Kempf, MD 06/09/19

## 2019-06-13 ENCOUNTER — Telehealth: Payer: Self-pay | Admitting: Medical Oncology

## 2019-06-13 ENCOUNTER — Ambulatory Visit: Payer: Medicare HMO | Admitting: Radiation Oncology

## 2019-06-13 NOTE — Telephone Encounter (Signed)
Richard [name] , RN at  [name] said they do not provide enteral tube feeding products  . He has 5  cans left. [name] does not know where his enteral product comes from. Message forwarded to our dietician and inpt dietician and inpt care management.

## 2019-06-14 ENCOUNTER — Encounter: Payer: Self-pay | Admitting: *Deleted

## 2019-06-14 ENCOUNTER — Other Ambulatory Visit (INDEPENDENT_AMBULATORY_CARE_PROVIDER_SITE_OTHER): Payer: Self-pay | Admitting: Primary Care

## 2019-06-14 ENCOUNTER — Telehealth (INDEPENDENT_AMBULATORY_CARE_PROVIDER_SITE_OTHER): Payer: Self-pay | Admitting: Primary Care

## 2019-06-14 DIAGNOSIS — E785 Hyperlipidemia, unspecified: Secondary | ICD-10-CM | POA: Diagnosis not present

## 2019-06-14 DIAGNOSIS — R69 Illness, unspecified: Secondary | ICD-10-CM | POA: Diagnosis not present

## 2019-06-14 DIAGNOSIS — I10 Essential (primary) hypertension: Secondary | ICD-10-CM | POA: Diagnosis not present

## 2019-06-14 DIAGNOSIS — Z431 Encounter for attention to gastrostomy: Secondary | ICD-10-CM | POA: Diagnosis not present

## 2019-06-14 DIAGNOSIS — I4891 Unspecified atrial fibrillation: Secondary | ICD-10-CM | POA: Diagnosis not present

## 2019-06-14 DIAGNOSIS — Z7901 Long term (current) use of anticoagulants: Secondary | ICD-10-CM | POA: Diagnosis not present

## 2019-06-14 DIAGNOSIS — C342 Malignant neoplasm of middle lobe, bronchus or lung: Secondary | ICD-10-CM | POA: Diagnosis not present

## 2019-06-14 DIAGNOSIS — E43 Unspecified severe protein-calorie malnutrition: Secondary | ICD-10-CM | POA: Diagnosis not present

## 2019-06-14 DIAGNOSIS — R590 Localized enlarged lymph nodes: Secondary | ICD-10-CM | POA: Diagnosis not present

## 2019-06-14 MED ORDER — PRO-STAT SUGAR FREE PO LIQD: 30.0000 mL | Freq: Every day | ORAL | 3 refills | Status: DC

## 2019-06-14 NOTE — Progress Notes (Signed)
[place] Work  Clinical Social Work was referred by medical oncology PA for assessment of psychosocial needs; specifically nutrition supplement needs.  Clinical Social Worker contacted caregiver by phone  to offer support and assess for needs.  Patient's daughter, [name], reported patient is doing much better since discharging home from the hospital.  Her only concern is inability to pay for osmolite.  CSW is unaware of programs to assist with copay costs of supplies/supplements.  CSW discussed situation with dietitian. [place] dietitian agreed to contact patient's daughter- is already communicating with medical oncology team regarding needs.   Gwinda Maine, LCSW  Clinical Social Worker [facility]

## 2019-06-14 NOTE — Telephone Encounter (Addendum)
Home health director Kim frazier called to request to speak with a nurse, states this patient is running out of these supplies and would like a call back to discuss the following items-  1) Medication(s) Requested (by name): -Amino Acids-Protein Hydrolys (FEEDING SUPPLEMENT, PRO-STAT SUGAR FREE 64,) LIQD  -Nutritional Supplements (FEEDING SUPPLEMENT, OSMOLITE 1.5 CAL,)  2) Pharmacy of Choice: -[facility], [phone] p

## 2019-06-16 ENCOUNTER — Telehealth: Payer: Self-pay | Admitting: Nutrition

## 2019-06-16 DIAGNOSIS — E43 Unspecified severe protein-calorie malnutrition: Secondary | ICD-10-CM | POA: Diagnosis not present

## 2019-06-16 DIAGNOSIS — C3490 Malignant neoplasm of unspecified part of unspecified bronchus or lung: Secondary | ICD-10-CM | POA: Diagnosis not present

## 2019-06-16 DIAGNOSIS — R69 Illness, unspecified: Secondary | ICD-10-CM | POA: Diagnosis not present

## 2019-06-16 NOTE — Telephone Encounter (Signed)
Late entry  Contacted patient's daughter by phone and explained tube feeding resources.  Patient has a co-pay that must be paid before [facility] will send more tube feeding.  This was explained to daughter and she states understanding.  She has contact information for home care agency Nutrition Care Team.  Nutrition Team at [facility]: [phone]

## 2019-06-17 DIAGNOSIS — R69 Illness, unspecified: Secondary | ICD-10-CM | POA: Diagnosis not present

## 2019-06-17 DIAGNOSIS — E43 Unspecified severe protein-calorie malnutrition: Secondary | ICD-10-CM | POA: Diagnosis not present

## 2019-06-17 DIAGNOSIS — C3490 Malignant neoplasm of unspecified part of unspecified bronchus or lung: Secondary | ICD-10-CM | POA: Diagnosis not present

## 2019-06-18 DIAGNOSIS — R69 Illness, unspecified: Secondary | ICD-10-CM | POA: Diagnosis not present

## 2019-06-18 DIAGNOSIS — I4891 Unspecified atrial fibrillation: Secondary | ICD-10-CM | POA: Diagnosis not present

## 2019-06-18 DIAGNOSIS — Z431 Encounter for attention to gastrostomy: Secondary | ICD-10-CM | POA: Diagnosis not present

## 2019-06-18 DIAGNOSIS — R590 Localized enlarged lymph nodes: Secondary | ICD-10-CM | POA: Diagnosis not present

## 2019-06-18 DIAGNOSIS — Z7901 Long term (current) use of anticoagulants: Secondary | ICD-10-CM | POA: Diagnosis not present

## 2019-06-18 DIAGNOSIS — C342 Malignant neoplasm of middle lobe, bronchus or lung: Secondary | ICD-10-CM | POA: Diagnosis not present

## 2019-06-18 DIAGNOSIS — I10 Essential (primary) hypertension: Secondary | ICD-10-CM | POA: Diagnosis not present

## 2019-06-18 DIAGNOSIS — E785 Hyperlipidemia, unspecified: Secondary | ICD-10-CM | POA: Diagnosis not present

## 2019-06-18 DIAGNOSIS — E43 Unspecified severe protein-calorie malnutrition: Secondary | ICD-10-CM | POA: Diagnosis not present

## 2019-06-21 ENCOUNTER — Ambulatory Visit (INDEPENDENT_AMBULATORY_CARE_PROVIDER_SITE_OTHER): Payer: Medicare HMO | Admitting: Primary Care

## 2019-06-21 ENCOUNTER — Ambulatory Visit: Payer: Medicare HMO | Attending: Radiation Oncology | Admitting: Radiation Oncology

## 2019-06-21 DIAGNOSIS — Z7901 Long term (current) use of anticoagulants: Secondary | ICD-10-CM | POA: Diagnosis not present

## 2019-06-21 DIAGNOSIS — C342 Malignant neoplasm of middle lobe, bronchus or lung: Secondary | ICD-10-CM | POA: Diagnosis not present

## 2019-06-21 DIAGNOSIS — E785 Hyperlipidemia, unspecified: Secondary | ICD-10-CM | POA: Diagnosis not present

## 2019-06-21 DIAGNOSIS — I10 Essential (primary) hypertension: Secondary | ICD-10-CM | POA: Diagnosis not present

## 2019-06-21 DIAGNOSIS — I4891 Unspecified atrial fibrillation: Secondary | ICD-10-CM | POA: Diagnosis not present

## 2019-06-21 DIAGNOSIS — R69 Illness, unspecified: Secondary | ICD-10-CM | POA: Diagnosis not present

## 2019-06-21 DIAGNOSIS — E43 Unspecified severe protein-calorie malnutrition: Secondary | ICD-10-CM | POA: Diagnosis not present

## 2019-06-21 DIAGNOSIS — Z431 Encounter for attention to gastrostomy: Secondary | ICD-10-CM | POA: Diagnosis not present

## 2019-06-21 DIAGNOSIS — R590 Localized enlarged lymph nodes: Secondary | ICD-10-CM | POA: Diagnosis not present

## 2019-06-23 DIAGNOSIS — R590 Localized enlarged lymph nodes: Secondary | ICD-10-CM | POA: Diagnosis not present

## 2019-06-23 DIAGNOSIS — I4891 Unspecified atrial fibrillation: Secondary | ICD-10-CM | POA: Diagnosis not present

## 2019-06-23 DIAGNOSIS — R69 Illness, unspecified: Secondary | ICD-10-CM | POA: Diagnosis not present

## 2019-06-23 DIAGNOSIS — Z7901 Long term (current) use of anticoagulants: Secondary | ICD-10-CM | POA: Diagnosis not present

## 2019-06-23 DIAGNOSIS — Z431 Encounter for attention to gastrostomy: Secondary | ICD-10-CM | POA: Diagnosis not present

## 2019-06-23 DIAGNOSIS — E43 Unspecified severe protein-calorie malnutrition: Secondary | ICD-10-CM | POA: Diagnosis not present

## 2019-06-23 DIAGNOSIS — I10 Essential (primary) hypertension: Secondary | ICD-10-CM | POA: Diagnosis not present

## 2019-06-23 DIAGNOSIS — C342 Malignant neoplasm of middle lobe, bronchus or lung: Secondary | ICD-10-CM | POA: Diagnosis not present

## 2019-06-23 DIAGNOSIS — E785 Hyperlipidemia, unspecified: Secondary | ICD-10-CM | POA: Diagnosis not present

## 2019-06-24 ENCOUNTER — Telehealth: Payer: Self-pay | Admitting: *Deleted

## 2019-06-24 NOTE — Telephone Encounter (Signed)
Received call yesterday from [facility] stating pt is starting to have a breakdown on his L hip & they would like OK to clean with saline & apply calcium/silver alginate.  She states that it is not bad & actually looking better.  Informed Dr Julien Nordmann OK to do would care as suggested.

## 2019-06-28 DIAGNOSIS — I10 Essential (primary) hypertension: Secondary | ICD-10-CM | POA: Diagnosis not present

## 2019-06-28 DIAGNOSIS — E785 Hyperlipidemia, unspecified: Secondary | ICD-10-CM | POA: Diagnosis not present

## 2019-06-28 DIAGNOSIS — I4891 Unspecified atrial fibrillation: Secondary | ICD-10-CM | POA: Diagnosis not present

## 2019-06-28 DIAGNOSIS — E43 Unspecified severe protein-calorie malnutrition: Secondary | ICD-10-CM | POA: Diagnosis not present

## 2019-06-28 DIAGNOSIS — C342 Malignant neoplasm of middle lobe, bronchus or lung: Secondary | ICD-10-CM | POA: Diagnosis not present

## 2019-06-28 DIAGNOSIS — Z7901 Long term (current) use of anticoagulants: Secondary | ICD-10-CM | POA: Diagnosis not present

## 2019-06-28 DIAGNOSIS — R69 Illness, unspecified: Secondary | ICD-10-CM | POA: Diagnosis not present

## 2019-06-28 DIAGNOSIS — R590 Localized enlarged lymph nodes: Secondary | ICD-10-CM | POA: Diagnosis not present

## 2019-06-28 DIAGNOSIS — Z431 Encounter for attention to gastrostomy: Secondary | ICD-10-CM | POA: Diagnosis not present

## 2019-07-02 DIAGNOSIS — R590 Localized enlarged lymph nodes: Secondary | ICD-10-CM | POA: Diagnosis not present

## 2019-07-02 DIAGNOSIS — I10 Essential (primary) hypertension: Secondary | ICD-10-CM | POA: Diagnosis not present

## 2019-07-02 DIAGNOSIS — C342 Malignant neoplasm of middle lobe, bronchus or lung: Secondary | ICD-10-CM | POA: Diagnosis not present

## 2019-07-02 DIAGNOSIS — R69 Illness, unspecified: Secondary | ICD-10-CM | POA: Diagnosis not present

## 2019-07-02 DIAGNOSIS — I4891 Unspecified atrial fibrillation: Secondary | ICD-10-CM | POA: Diagnosis not present

## 2019-07-02 DIAGNOSIS — Z431 Encounter for attention to gastrostomy: Secondary | ICD-10-CM | POA: Diagnosis not present

## 2019-07-02 DIAGNOSIS — E43 Unspecified severe protein-calorie malnutrition: Secondary | ICD-10-CM | POA: Diagnosis not present

## 2019-07-02 DIAGNOSIS — E785 Hyperlipidemia, unspecified: Secondary | ICD-10-CM | POA: Diagnosis not present

## 2019-07-02 DIAGNOSIS — Z7901 Long term (current) use of anticoagulants: Secondary | ICD-10-CM | POA: Diagnosis not present

## 2019-07-06 ENCOUNTER — Telehealth (INDEPENDENT_AMBULATORY_CARE_PROVIDER_SITE_OTHER): Payer: Medicare HMO | Admitting: Cardiology

## 2019-07-06 ENCOUNTER — Telehealth: Payer: Medicare HMO | Admitting: Cardiology

## 2019-07-06 ENCOUNTER — Telehealth: Payer: Self-pay | Admitting: Medical Oncology

## 2019-07-06 ENCOUNTER — Encounter: Payer: Self-pay | Admitting: Cardiology

## 2019-07-06 DIAGNOSIS — I480 Paroxysmal atrial fibrillation: Secondary | ICD-10-CM

## 2019-07-06 DIAGNOSIS — C3491 Malignant neoplasm of unspecified part of right bronchus or lung: Secondary | ICD-10-CM | POA: Diagnosis not present

## 2019-07-06 NOTE — Patient Instructions (Signed)
Medication Instructions:  CONTINUE WITH CURRENT MEDICATIONS *If you need a refill on your cardiac medications before your next appointment, please call your pharmacy*  Lab Work: NONE If you have labs (blood work) drawn today and your tests are completely normal, you will receive your results only by: Marland Kitchen MyChart Message (if you have MyChart) OR . A paper copy in the mail If you have any lab test that is abnormal or we need to change your treatment, we will call you to review the results.   Follow-Up: AS NEEDED WITH DR. Harrell Gave

## 2019-07-06 NOTE — Telephone Encounter (Signed)
"  What are they going to do for Richard [name]'s prostate cancer?" I told Richard [name] I did not see any record about prostate cancer and to contact PCP regarding her question. I also told her Dr Julien Nordmann treated him for lung cancer.

## 2019-07-06 NOTE — Progress Notes (Signed)
Virtual Visit via Telephone Note   This visit type was conducted due to national recommendations for restrictions regarding the COVID-19 Pandemic (e.g. social distancing) in an effort to limit this patient's exposure and mitigate transmission in our community.  Due to his co-morbid illnesses, this patient is at least at moderate risk for complications without adequate follow up.  This format is felt to be most appropriate for this patient at this time.  The patient did not have access to video technology/had technical difficulties with video requiring transitioning to audio format only (telephone).  All issues noted in this document were discussed and addressed.  No physical exam could be performed with this format.  Please refer to the patient's chart for his  consent to telehealth for [facility].   Date:  07/06/2019   ID:  Richard [name], Richard [name] [birth year], MRN [id number]  Patient Location: Home Provider Location: Office  PCP:  [name], NP  Cardiologist:  Candee Furbish, MD  Electrophysiologist:  None   Evaluation Performed:  Follow-Up Visit  Chief Complaint:  Follow up  History of Present Illness:    Richard [name] is a 79 y.o. male with stage 3 NSCLC, recent admission for HCAP, severe protein calorie malnutrition and failure to thrive now s/p feeding tube, paroxysmal atrial fibrillation who is seen for follow up virtually.  The patient does not have symptoms concerning for COVID-19 infection (fever, chills, cough, or new shortness of breath).   Patient has baseline dementia and unable to give any history. History obtained through daughter Christie Copley today.  We reviewed recent hospitalization. She states that he has much more energy since PEG tube placed. Able to walk around the house. Breathing is stable.  Has home health nurse/palliative care services. No recent vitals, but [name] denies any recent fast heart rates. Discussed that if there is afib RVR we would want to  add a medication to manage this. She thinks his heart rate has been well controlled but will contact us if this changes.  No chest pain, no syncope, no edema that she is aware of. She feels he is recovering well from recent hospitalization.   Past Medical History:  Diagnosis Date  . Dementia ([place])   . High cholesterol   . Hypertension   . Lung mass 12/2018   Past Surgical History:  Procedure Laterality Date  . IR GASTROSTOMY TUBE MOD SED  05/25/2019  . JOINT REPLACEMENT    . VIDEO BRONCHOSCOPY WITH ENDOBRONCHIAL ULTRASOUND N/A 12/16/2018   Procedure: VIDEO BRONCHOSCOPY WITH ENDOBRONCHIAL ULTRASOUND AND FLUROSCOPY;  Surgeon: [name], MD;  Location: [place];  Service: Pulmonary;  Laterality: N/A;     Current Meds  Medication Sig  . Amino Acids-Protein Hydrolys (FEEDING SUPPLEMENT, PRO-STAT SUGAR FREE 64,) LIQD Place 30 mLs into feeding tube daily.  . ferrous sulfate 325 (65 FE) MG tablet Take 325 mg by mouth 2 (two) times daily with a meal.  . Multiple Vitamin (MULTIVITAMIN) tablet Take 1 tablet by mouth daily.  . Nutritional Supplements (FEEDING SUPPLEMENT, OSMOLITE 1.5 CAL,) LIQD Place 237 mLs into feeding tube 4 (four) times daily.  . Omega-3 Fatty Acids (FISH OIL) 1200 MG CAPS Take 1,200 mg by mouth daily.  . rivaroxaban (XARELTO) 20 MG TABS tablet Take 1 tablet (20 mg total) by mouth daily with supper.  . Water For Irrigation, Sterile (FREE WATER) SOLN Place 100 mLs into feeding tube every 6 (six) hours.     Allergies:   Patient has no known allergies.  Social History   Tobacco Use  . Smoking status: Current Some Day Smoker    Packs/day: 1.00    Years: 60.00    Pack years: 60.00    Types: Cigarettes  . Smokeless tobacco: Never Used  Substance Use Topics  . Alcohol use: Yes    Comment: occasional  . Drug use: No     Family Hx: The patient's family history includes COPD in his father; Lung cancer in his brother and brother; Stomach cancer in his mother.  ROS:     Please see the history of present illness.    All other systems reviewed and are negative.   Prior CV studies:   The following studies were reviewed today: No new since last visit  Labs/Other Tests and Data Reviewed:    EKG:  An ECG dated 05/08/19 was personally reviewed today and demonstrated:  sinus tachycardia  Recent Labs: 02/04/2019: TSH 2.966 05/25/2019: ALT 12 05/27/2019: Magnesium 2.0 05/30/2019: BUN 14; Creatinine, Ser 0.52; Hemoglobin 10.7; Platelets 417; Potassium 4.8; Sodium 140   Recent Lipid Panel Lab Results  Component Value Date/Time   CHOL 252 (H) 10/29/2016 03:13 PM   TRIG 188 (H) 10/29/2016 03:13 PM   HDL 62 10/29/2016 03:13 PM   CHOLHDL 4.1 10/29/2016 03:13 PM   LDLCALC 152 (H) 10/29/2016 03:13 PM    Wt Readings from Last 3 Encounters:  05/19/19 134 lb 4.2 oz (60.9 kg)  05/08/19 140 lb 3.4 oz (63.6 kg)  05/02/19 144 lb 1.6 oz (65.4 kg)     Objective:    Vital Signs:  There were no vitals taken for this visit.   No physical exam, telephone visit.  ASSESSMENT & PLAN:    Paroxysmal atrial fibrillation with RVR -on rivaroxaban, tolerating -not on rate control agent. Daughter reports good HR control per home health nurse. No vitals available today. -it is unclear to me what the long term plan/prognosis is. Through reading the recent hospital notes, it appears to me that there is a disconnect between expectations. I reviewed the recent oncology note 06/09/19 discussing that he is not a good candidate for further treatment. Patient's daughter feels he is doing well at the moment. Given recent discussions, I will plan to be available as needed for any questions or concerns regarding his afib management.  Stage 3 NSCLC, now with feeding tube and palliative care: spoke with daughter today. She does not have recent vital signs but reports that home health nurse has been "happy" with recent numbers. Discussed concern re: tachycardia, above.  Recent  hospitalization with hypoxia 2/2 health care associated pneumonia and failure to thrive, severe protein calorie malnutrition: see above, now s/p feeding tube and palliative care. Discharged 05/30/19.  Time:   Today, I have spent 9 minutes with the patient with telehealth technology discussing the above problems. Additional chart review of recent hospitalization and documentation brings total time to 21 minutes.    Medication Adjustments/Labs and Tests Ordered: Current medicines are reviewed at length with the patient today.  Concerns regarding medicines are outlined above.   Tests Ordered: No orders of the defined types were placed in this encounter.   Medication Changes: No orders of the defined types were placed in this encounter.   Follow Up:  As needed  Signed, Buford Dresser, MD  07/06/2019 5:03 PM    [place]

## 2019-07-07 ENCOUNTER — Encounter (INDEPENDENT_AMBULATORY_CARE_PROVIDER_SITE_OTHER): Payer: Self-pay | Admitting: Primary Care

## 2019-07-07 ENCOUNTER — Ambulatory Visit (INDEPENDENT_AMBULATORY_CARE_PROVIDER_SITE_OTHER): Payer: Medicare HMO | Admitting: Primary Care

## 2019-07-07 ENCOUNTER — Other Ambulatory Visit: Payer: Self-pay

## 2019-07-07 DIAGNOSIS — R69 Illness, unspecified: Secondary | ICD-10-CM | POA: Diagnosis not present

## 2019-07-07 DIAGNOSIS — I4891 Unspecified atrial fibrillation: Secondary | ICD-10-CM | POA: Diagnosis not present

## 2019-07-07 DIAGNOSIS — C3491 Malignant neoplasm of unspecified part of right bronchus or lung: Secondary | ICD-10-CM | POA: Diagnosis not present

## 2019-07-07 DIAGNOSIS — G309 Alzheimer's disease, unspecified: Secondary | ICD-10-CM

## 2019-07-07 DIAGNOSIS — Z431 Encounter for attention to gastrostomy: Secondary | ICD-10-CM | POA: Diagnosis not present

## 2019-07-07 DIAGNOSIS — R590 Localized enlarged lymph nodes: Secondary | ICD-10-CM | POA: Diagnosis not present

## 2019-07-07 DIAGNOSIS — E785 Hyperlipidemia, unspecified: Secondary | ICD-10-CM | POA: Diagnosis not present

## 2019-07-07 DIAGNOSIS — R2681 Unsteadiness on feet: Secondary | ICD-10-CM | POA: Diagnosis not present

## 2019-07-07 DIAGNOSIS — F0280 Dementia in other diseases classified elsewhere without behavioral disturbance: Secondary | ICD-10-CM | POA: Diagnosis not present

## 2019-07-07 DIAGNOSIS — Z7901 Long term (current) use of anticoagulants: Secondary | ICD-10-CM | POA: Diagnosis not present

## 2019-07-07 DIAGNOSIS — C342 Malignant neoplasm of middle lobe, bronchus or lung: Secondary | ICD-10-CM | POA: Diagnosis not present

## 2019-07-07 DIAGNOSIS — I10 Essential (primary) hypertension: Secondary | ICD-10-CM | POA: Diagnosis not present

## 2019-07-07 DIAGNOSIS — L89221 Pressure ulcer of left hip, stage 1: Secondary | ICD-10-CM | POA: Diagnosis not present

## 2019-07-07 DIAGNOSIS — E43 Unspecified severe protein-calorie malnutrition: Secondary | ICD-10-CM | POA: Diagnosis not present

## 2019-07-07 NOTE — Progress Notes (Signed)
Virtual Visit via Telephone Note  I connected with Richard [name] on 07/07/19 at 10:30 AM EST by telephone and verified that I am speaking with the correct person using two identifiers.   I discussed the limitations, risks, security and privacy concerns of performing an evaluation and management service by telephone and the availability of in person appointments. I also discussed with the patient that there may be a patient responsible charge related to this service. The patient expressed understanding and agreed to proceed.   History of Present Illness: Ms. Elyas Villamor has permission to speak on Mr. Freda Munro behalf . Patient is unable to walk without assistance increase risk for falls. Recently finished chemo/radiation a feeding tube was place in the hospital. Daughter requesting a  wheel chair to help with transportation and assist with mobility in the home.  Past Medical History:  Diagnosis Date  . Dementia ([place])   . High cholesterol   . Hypertension   . Lung mass 12/2018   Current Outpatient Medications on File Prior to Visit  Medication Sig Dispense Refill  . Amino Acids-Protein Hydrolys (FEEDING SUPPLEMENT, PRO-STAT SUGAR FREE 64,) LIQD Place 30 mLs into feeding tube daily. 887 mL 3  . ferrous sulfate 325 (65 FE) MG tablet Take 325 mg by mouth 2 (two) times daily with a meal.    . Multiple Vitamin (MULTIVITAMIN) tablet Take 1 tablet by mouth daily.    . Nutritional Supplements (FEEDING SUPPLEMENT, OSMOLITE 1.5 CAL,) LIQD Place 237 mLs into feeding tube 4 (four) times daily.  0  . Omega-3 Fatty Acids (FISH OIL) 1200 MG CAPS Take 1,200 mg by mouth daily.    . rivaroxaban (XARELTO) 20 MG TABS tablet Take 1 tablet (20 mg total) by mouth daily with supper. 30 tablet 0  . Water For Irrigation, Sterile (FREE WATER) SOLN Place 100 mLs into feeding tube every 6 (six) hours.     No current facility-administered medications on file prior to visit.    .Observations/Objective: Review of  Systems  Constitutional: Positive for weight loss.       Feeding tube in place  Musculoskeletal: Positive for falls.       Unstable gait  Neurological: Positive for weakness.  Psychiatric/Behavioral: Positive for memory loss.     Assessment and Plan: Devlyn was seen today for wheelchair.  Diagnoses and all orders for this visit:  Alzheimer's dementia without behavioral disturbance, unspecified timing of dementia onset [facility]) Patient also has Stage III squamous cell carcinoma of right lung followed by oncology . Needs assistance with ADL's and IADL's . Receives nutritional supplement. May also benefit with assistance in the home.  Unstable gait Increase risk of falls with memory decling and deconditioning he would benefit for wheel chair for appointments, mobility in home and transfers. -     DME Wheelchair manual   Follow Up Instructions:    I discussed the assessment and treatment plan with the patient. The patient was provided an opportunity to ask questions and all were answered. The patient agreed with the plan and demonstrated an understanding of the instructions.   The patient was advised to call back or seek an in-person evaluation if the symptoms worsen or if the condition fails to improve as anticipated.  I provided 15 minutes of non-face-to-face time during this encounter.   [name], NP

## 2019-07-07 NOTE — Progress Notes (Signed)
Pt gives daughter permission to speak on his behalf

## 2019-07-08 ENCOUNTER — Other Ambulatory Visit: Payer: Self-pay | Admitting: Medical Oncology

## 2019-07-08 DIAGNOSIS — I2699 Other pulmonary embolism without acute cor pulmonale: Secondary | ICD-10-CM

## 2019-07-08 MED ORDER — RIVAROXABAN 20 MG PO TABS: 20.0000 mg | ORAL_TABLET | Freq: Every day | ORAL | 0 refills | Status: DC

## 2019-07-08 MED FILL — XARELTO 20 MG TABLET: 20 | 30 days supply | Qty: 30 | Fill #0

## 2019-07-08 NOTE — Telephone Encounter (Signed)
Feeding tube - She thinks pt wants feeding tube removed. He is drinking his ensure orally and sneaking food into his room to eat.  Prostate concern-  xarelto refill- Rx sent to [facility].   Per Dr Julien Nordmann,  I t old [name] that he will evaluate pt in March regarding nutrition and any other concerns.  I told [name] that if pt is swallowing without problems  then he can try eating foods and continue oral liquids.

## 2019-07-12 DIAGNOSIS — I10 Essential (primary) hypertension: Secondary | ICD-10-CM | POA: Diagnosis not present

## 2019-07-12 DIAGNOSIS — E785 Hyperlipidemia, unspecified: Secondary | ICD-10-CM | POA: Diagnosis not present

## 2019-07-12 DIAGNOSIS — E43 Unspecified severe protein-calorie malnutrition: Secondary | ICD-10-CM | POA: Diagnosis not present

## 2019-07-12 DIAGNOSIS — I4891 Unspecified atrial fibrillation: Secondary | ICD-10-CM | POA: Diagnosis not present

## 2019-07-12 DIAGNOSIS — R590 Localized enlarged lymph nodes: Secondary | ICD-10-CM | POA: Diagnosis not present

## 2019-07-12 DIAGNOSIS — Z431 Encounter for attention to gastrostomy: Secondary | ICD-10-CM | POA: Diagnosis not present

## 2019-07-12 DIAGNOSIS — C342 Malignant neoplasm of middle lobe, bronchus or lung: Secondary | ICD-10-CM | POA: Diagnosis not present

## 2019-07-12 DIAGNOSIS — Z7901 Long term (current) use of anticoagulants: Secondary | ICD-10-CM | POA: Diagnosis not present

## 2019-07-12 DIAGNOSIS — L89221 Pressure ulcer of left hip, stage 1: Secondary | ICD-10-CM | POA: Diagnosis not present

## 2019-07-12 DIAGNOSIS — R69 Illness, unspecified: Secondary | ICD-10-CM | POA: Diagnosis not present

## 2019-07-14 ENCOUNTER — Telehealth: Payer: Self-pay | Admitting: Medical Oncology

## 2019-07-14 DIAGNOSIS — Z7901 Long term (current) use of anticoagulants: Secondary | ICD-10-CM | POA: Diagnosis not present

## 2019-07-14 DIAGNOSIS — R590 Localized enlarged lymph nodes: Secondary | ICD-10-CM | POA: Diagnosis not present

## 2019-07-14 DIAGNOSIS — I10 Essential (primary) hypertension: Secondary | ICD-10-CM | POA: Diagnosis not present

## 2019-07-14 DIAGNOSIS — C342 Malignant neoplasm of middle lobe, bronchus or lung: Secondary | ICD-10-CM | POA: Diagnosis not present

## 2019-07-14 DIAGNOSIS — Z431 Encounter for attention to gastrostomy: Secondary | ICD-10-CM | POA: Diagnosis not present

## 2019-07-14 DIAGNOSIS — I4891 Unspecified atrial fibrillation: Secondary | ICD-10-CM | POA: Diagnosis not present

## 2019-07-14 DIAGNOSIS — E785 Hyperlipidemia, unspecified: Secondary | ICD-10-CM | POA: Diagnosis not present

## 2019-07-14 DIAGNOSIS — E43 Unspecified severe protein-calorie malnutrition: Secondary | ICD-10-CM | POA: Diagnosis not present

## 2019-07-14 DIAGNOSIS — L89221 Pressure ulcer of left hip, stage 1: Secondary | ICD-10-CM | POA: Diagnosis not present

## 2019-07-14 DIAGNOSIS — R69 Illness, unspecified: Secondary | ICD-10-CM | POA: Diagnosis not present

## 2019-07-14 NOTE — Telephone Encounter (Signed)
Requests orders for PT treatment: 2x week x 6 weeks, then 1 x week for 2 weeks. Okay per [facility]

## 2019-07-18 DIAGNOSIS — E43 Unspecified severe protein-calorie malnutrition: Secondary | ICD-10-CM | POA: Diagnosis not present

## 2019-07-18 DIAGNOSIS — E785 Hyperlipidemia, unspecified: Secondary | ICD-10-CM | POA: Diagnosis not present

## 2019-07-18 DIAGNOSIS — I10 Essential (primary) hypertension: Secondary | ICD-10-CM | POA: Diagnosis not present

## 2019-07-18 DIAGNOSIS — L89221 Pressure ulcer of left hip, stage 1: Secondary | ICD-10-CM | POA: Diagnosis not present

## 2019-07-18 DIAGNOSIS — I4891 Unspecified atrial fibrillation: Secondary | ICD-10-CM | POA: Diagnosis not present

## 2019-07-18 DIAGNOSIS — C342 Malignant neoplasm of middle lobe, bronchus or lung: Secondary | ICD-10-CM | POA: Diagnosis not present

## 2019-07-18 DIAGNOSIS — Z7901 Long term (current) use of anticoagulants: Secondary | ICD-10-CM | POA: Diagnosis not present

## 2019-07-18 DIAGNOSIS — Z431 Encounter for attention to gastrostomy: Secondary | ICD-10-CM | POA: Diagnosis not present

## 2019-07-18 DIAGNOSIS — R69 Illness, unspecified: Secondary | ICD-10-CM | POA: Diagnosis not present

## 2019-07-18 DIAGNOSIS — R590 Localized enlarged lymph nodes: Secondary | ICD-10-CM | POA: Diagnosis not present

## 2019-07-19 DIAGNOSIS — Z431 Encounter for attention to gastrostomy: Secondary | ICD-10-CM | POA: Diagnosis not present

## 2019-07-19 DIAGNOSIS — L89221 Pressure ulcer of left hip, stage 1: Secondary | ICD-10-CM | POA: Diagnosis not present

## 2019-07-19 DIAGNOSIS — I4891 Unspecified atrial fibrillation: Secondary | ICD-10-CM | POA: Diagnosis not present

## 2019-07-19 DIAGNOSIS — E785 Hyperlipidemia, unspecified: Secondary | ICD-10-CM | POA: Diagnosis not present

## 2019-07-19 DIAGNOSIS — Z7901 Long term (current) use of anticoagulants: Secondary | ICD-10-CM | POA: Diagnosis not present

## 2019-07-19 DIAGNOSIS — R590 Localized enlarged lymph nodes: Secondary | ICD-10-CM | POA: Diagnosis not present

## 2019-07-19 DIAGNOSIS — C342 Malignant neoplasm of middle lobe, bronchus or lung: Secondary | ICD-10-CM | POA: Diagnosis not present

## 2019-07-19 DIAGNOSIS — E43 Unspecified severe protein-calorie malnutrition: Secondary | ICD-10-CM | POA: Diagnosis not present

## 2019-07-19 DIAGNOSIS — I10 Essential (primary) hypertension: Secondary | ICD-10-CM | POA: Diagnosis not present

## 2019-07-19 DIAGNOSIS — R69 Illness, unspecified: Secondary | ICD-10-CM | POA: Diagnosis not present

## 2019-07-22 DIAGNOSIS — Z7901 Long term (current) use of anticoagulants: Secondary | ICD-10-CM | POA: Diagnosis not present

## 2019-07-22 DIAGNOSIS — C342 Malignant neoplasm of middle lobe, bronchus or lung: Secondary | ICD-10-CM | POA: Diagnosis not present

## 2019-07-22 DIAGNOSIS — E43 Unspecified severe protein-calorie malnutrition: Secondary | ICD-10-CM | POA: Diagnosis not present

## 2019-07-22 DIAGNOSIS — Z431 Encounter for attention to gastrostomy: Secondary | ICD-10-CM | POA: Diagnosis not present

## 2019-07-22 DIAGNOSIS — L89221 Pressure ulcer of left hip, stage 1: Secondary | ICD-10-CM | POA: Diagnosis not present

## 2019-07-22 DIAGNOSIS — E785 Hyperlipidemia, unspecified: Secondary | ICD-10-CM | POA: Diagnosis not present

## 2019-07-22 DIAGNOSIS — I10 Essential (primary) hypertension: Secondary | ICD-10-CM | POA: Diagnosis not present

## 2019-07-22 DIAGNOSIS — I4891 Unspecified atrial fibrillation: Secondary | ICD-10-CM | POA: Diagnosis not present

## 2019-07-22 DIAGNOSIS — R69 Illness, unspecified: Secondary | ICD-10-CM | POA: Diagnosis not present

## 2019-07-22 DIAGNOSIS — R590 Localized enlarged lymph nodes: Secondary | ICD-10-CM | POA: Diagnosis not present

## 2019-07-26 DIAGNOSIS — Z431 Encounter for attention to gastrostomy: Secondary | ICD-10-CM | POA: Diagnosis not present

## 2019-07-26 DIAGNOSIS — C342 Malignant neoplasm of middle lobe, bronchus or lung: Secondary | ICD-10-CM | POA: Diagnosis not present

## 2019-07-26 DIAGNOSIS — L89221 Pressure ulcer of left hip, stage 1: Secondary | ICD-10-CM | POA: Diagnosis not present

## 2019-07-26 DIAGNOSIS — R590 Localized enlarged lymph nodes: Secondary | ICD-10-CM | POA: Diagnosis not present

## 2019-07-26 DIAGNOSIS — I4891 Unspecified atrial fibrillation: Secondary | ICD-10-CM | POA: Diagnosis not present

## 2019-07-26 DIAGNOSIS — R69 Illness, unspecified: Secondary | ICD-10-CM | POA: Diagnosis not present

## 2019-07-26 DIAGNOSIS — E785 Hyperlipidemia, unspecified: Secondary | ICD-10-CM | POA: Diagnosis not present

## 2019-07-26 DIAGNOSIS — E43 Unspecified severe protein-calorie malnutrition: Secondary | ICD-10-CM | POA: Diagnosis not present

## 2019-07-26 DIAGNOSIS — Z7901 Long term (current) use of anticoagulants: Secondary | ICD-10-CM | POA: Diagnosis not present

## 2019-07-26 DIAGNOSIS — I10 Essential (primary) hypertension: Secondary | ICD-10-CM | POA: Diagnosis not present

## 2019-07-27 DIAGNOSIS — R69 Illness, unspecified: Secondary | ICD-10-CM | POA: Diagnosis not present

## 2019-07-27 DIAGNOSIS — I10 Essential (primary) hypertension: Secondary | ICD-10-CM | POA: Diagnosis not present

## 2019-07-27 DIAGNOSIS — Z7901 Long term (current) use of anticoagulants: Secondary | ICD-10-CM | POA: Diagnosis not present

## 2019-07-27 DIAGNOSIS — Z431 Encounter for attention to gastrostomy: Secondary | ICD-10-CM | POA: Diagnosis not present

## 2019-07-27 DIAGNOSIS — E43 Unspecified severe protein-calorie malnutrition: Secondary | ICD-10-CM | POA: Diagnosis not present

## 2019-07-27 DIAGNOSIS — I4891 Unspecified atrial fibrillation: Secondary | ICD-10-CM | POA: Diagnosis not present

## 2019-07-27 DIAGNOSIS — R590 Localized enlarged lymph nodes: Secondary | ICD-10-CM | POA: Diagnosis not present

## 2019-07-27 DIAGNOSIS — E785 Hyperlipidemia, unspecified: Secondary | ICD-10-CM | POA: Diagnosis not present

## 2019-07-27 DIAGNOSIS — C342 Malignant neoplasm of middle lobe, bronchus or lung: Secondary | ICD-10-CM | POA: Diagnosis not present

## 2019-07-27 DIAGNOSIS — L89221 Pressure ulcer of left hip, stage 1: Secondary | ICD-10-CM | POA: Diagnosis not present

## 2019-07-29 DIAGNOSIS — I10 Essential (primary) hypertension: Secondary | ICD-10-CM | POA: Diagnosis not present

## 2019-07-29 DIAGNOSIS — R69 Illness, unspecified: Secondary | ICD-10-CM | POA: Diagnosis not present

## 2019-07-29 DIAGNOSIS — Z431 Encounter for attention to gastrostomy: Secondary | ICD-10-CM | POA: Diagnosis not present

## 2019-07-29 DIAGNOSIS — E43 Unspecified severe protein-calorie malnutrition: Secondary | ICD-10-CM | POA: Diagnosis not present

## 2019-07-29 DIAGNOSIS — I4891 Unspecified atrial fibrillation: Secondary | ICD-10-CM | POA: Diagnosis not present

## 2019-07-29 DIAGNOSIS — L89221 Pressure ulcer of left hip, stage 1: Secondary | ICD-10-CM | POA: Diagnosis not present

## 2019-07-29 DIAGNOSIS — Z7901 Long term (current) use of anticoagulants: Secondary | ICD-10-CM | POA: Diagnosis not present

## 2019-07-29 DIAGNOSIS — R590 Localized enlarged lymph nodes: Secondary | ICD-10-CM | POA: Diagnosis not present

## 2019-07-29 DIAGNOSIS — E785 Hyperlipidemia, unspecified: Secondary | ICD-10-CM | POA: Diagnosis not present

## 2019-07-29 DIAGNOSIS — C342 Malignant neoplasm of middle lobe, bronchus or lung: Secondary | ICD-10-CM | POA: Diagnosis not present

## 2019-08-05 DIAGNOSIS — Z7901 Long term (current) use of anticoagulants: Secondary | ICD-10-CM | POA: Diagnosis not present

## 2019-08-05 DIAGNOSIS — E43 Unspecified severe protein-calorie malnutrition: Secondary | ICD-10-CM | POA: Diagnosis not present

## 2019-08-05 DIAGNOSIS — L89221 Pressure ulcer of left hip, stage 1: Secondary | ICD-10-CM | POA: Diagnosis not present

## 2019-08-05 DIAGNOSIS — E785 Hyperlipidemia, unspecified: Secondary | ICD-10-CM | POA: Diagnosis not present

## 2019-08-05 DIAGNOSIS — R590 Localized enlarged lymph nodes: Secondary | ICD-10-CM | POA: Diagnosis not present

## 2019-08-05 DIAGNOSIS — Z431 Encounter for attention to gastrostomy: Secondary | ICD-10-CM | POA: Diagnosis not present

## 2019-08-05 DIAGNOSIS — I4891 Unspecified atrial fibrillation: Secondary | ICD-10-CM | POA: Diagnosis not present

## 2019-08-05 DIAGNOSIS — I10 Essential (primary) hypertension: Secondary | ICD-10-CM | POA: Diagnosis not present

## 2019-08-05 DIAGNOSIS — C342 Malignant neoplasm of middle lobe, bronchus or lung: Secondary | ICD-10-CM | POA: Diagnosis not present

## 2019-08-05 DIAGNOSIS — R69 Illness, unspecified: Secondary | ICD-10-CM | POA: Diagnosis not present

## 2019-08-06 DIAGNOSIS — Z7901 Long term (current) use of anticoagulants: Secondary | ICD-10-CM | POA: Diagnosis not present

## 2019-08-06 DIAGNOSIS — R590 Localized enlarged lymph nodes: Secondary | ICD-10-CM | POA: Diagnosis not present

## 2019-08-06 DIAGNOSIS — L89221 Pressure ulcer of left hip, stage 1: Secondary | ICD-10-CM | POA: Diagnosis not present

## 2019-08-06 DIAGNOSIS — Z431 Encounter for attention to gastrostomy: Secondary | ICD-10-CM | POA: Diagnosis not present

## 2019-08-06 DIAGNOSIS — E785 Hyperlipidemia, unspecified: Secondary | ICD-10-CM | POA: Diagnosis not present

## 2019-08-06 DIAGNOSIS — I4891 Unspecified atrial fibrillation: Secondary | ICD-10-CM | POA: Diagnosis not present

## 2019-08-06 DIAGNOSIS — C342 Malignant neoplasm of middle lobe, bronchus or lung: Secondary | ICD-10-CM | POA: Diagnosis not present

## 2019-08-06 DIAGNOSIS — E43 Unspecified severe protein-calorie malnutrition: Secondary | ICD-10-CM | POA: Diagnosis not present

## 2019-08-06 DIAGNOSIS — I10 Essential (primary) hypertension: Secondary | ICD-10-CM | POA: Diagnosis not present

## 2019-08-06 DIAGNOSIS — R69 Illness, unspecified: Secondary | ICD-10-CM | POA: Diagnosis not present

## 2019-08-10 ENCOUNTER — Other Ambulatory Visit: Payer: Self-pay | Admitting: Internal Medicine

## 2019-08-10 DIAGNOSIS — E785 Hyperlipidemia, unspecified: Secondary | ICD-10-CM | POA: Diagnosis not present

## 2019-08-10 DIAGNOSIS — I10 Essential (primary) hypertension: Secondary | ICD-10-CM | POA: Diagnosis not present

## 2019-08-10 DIAGNOSIS — Z431 Encounter for attention to gastrostomy: Secondary | ICD-10-CM | POA: Diagnosis not present

## 2019-08-10 DIAGNOSIS — I2699 Other pulmonary embolism without acute cor pulmonale: Secondary | ICD-10-CM

## 2019-08-10 DIAGNOSIS — R69 Illness, unspecified: Secondary | ICD-10-CM | POA: Diagnosis not present

## 2019-08-10 DIAGNOSIS — L89221 Pressure ulcer of left hip, stage 1: Secondary | ICD-10-CM | POA: Diagnosis not present

## 2019-08-10 DIAGNOSIS — R590 Localized enlarged lymph nodes: Secondary | ICD-10-CM | POA: Diagnosis not present

## 2019-08-10 DIAGNOSIS — E43 Unspecified severe protein-calorie malnutrition: Secondary | ICD-10-CM | POA: Diagnosis not present

## 2019-08-10 DIAGNOSIS — Z7901 Long term (current) use of anticoagulants: Secondary | ICD-10-CM | POA: Diagnosis not present

## 2019-08-10 DIAGNOSIS — I4891 Unspecified atrial fibrillation: Secondary | ICD-10-CM | POA: Diagnosis not present

## 2019-08-10 DIAGNOSIS — C342 Malignant neoplasm of middle lobe, bronchus or lung: Secondary | ICD-10-CM | POA: Diagnosis not present

## 2019-08-10 MED FILL — XARELTO 20 MG TABLET: 20 | 30 days supply | Qty: 30 | Fill #0

## 2019-08-11 DIAGNOSIS — Z431 Encounter for attention to gastrostomy: Secondary | ICD-10-CM | POA: Diagnosis not present

## 2019-08-11 DIAGNOSIS — Z7901 Long term (current) use of anticoagulants: Secondary | ICD-10-CM | POA: Diagnosis not present

## 2019-08-11 DIAGNOSIS — L89221 Pressure ulcer of left hip, stage 1: Secondary | ICD-10-CM | POA: Diagnosis not present

## 2019-08-11 DIAGNOSIS — C342 Malignant neoplasm of middle lobe, bronchus or lung: Secondary | ICD-10-CM | POA: Diagnosis not present

## 2019-08-11 DIAGNOSIS — I4891 Unspecified atrial fibrillation: Secondary | ICD-10-CM | POA: Diagnosis not present

## 2019-08-11 DIAGNOSIS — R590 Localized enlarged lymph nodes: Secondary | ICD-10-CM | POA: Diagnosis not present

## 2019-08-11 DIAGNOSIS — E785 Hyperlipidemia, unspecified: Secondary | ICD-10-CM | POA: Diagnosis not present

## 2019-08-11 DIAGNOSIS — E43 Unspecified severe protein-calorie malnutrition: Secondary | ICD-10-CM | POA: Diagnosis not present

## 2019-08-11 DIAGNOSIS — I10 Essential (primary) hypertension: Secondary | ICD-10-CM | POA: Diagnosis not present

## 2019-08-11 DIAGNOSIS — R69 Illness, unspecified: Secondary | ICD-10-CM | POA: Diagnosis not present

## 2019-08-17 DIAGNOSIS — I10 Essential (primary) hypertension: Secondary | ICD-10-CM | POA: Diagnosis not present

## 2019-08-17 DIAGNOSIS — L89221 Pressure ulcer of left hip, stage 1: Secondary | ICD-10-CM | POA: Diagnosis not present

## 2019-08-17 DIAGNOSIS — E43 Unspecified severe protein-calorie malnutrition: Secondary | ICD-10-CM | POA: Diagnosis not present

## 2019-08-17 DIAGNOSIS — Z7901 Long term (current) use of anticoagulants: Secondary | ICD-10-CM | POA: Diagnosis not present

## 2019-08-17 DIAGNOSIS — Z431 Encounter for attention to gastrostomy: Secondary | ICD-10-CM | POA: Diagnosis not present

## 2019-08-17 DIAGNOSIS — C342 Malignant neoplasm of middle lobe, bronchus or lung: Secondary | ICD-10-CM | POA: Diagnosis not present

## 2019-08-17 DIAGNOSIS — R590 Localized enlarged lymph nodes: Secondary | ICD-10-CM | POA: Diagnosis not present

## 2019-08-17 DIAGNOSIS — E785 Hyperlipidemia, unspecified: Secondary | ICD-10-CM | POA: Diagnosis not present

## 2019-08-17 DIAGNOSIS — I4891 Unspecified atrial fibrillation: Secondary | ICD-10-CM | POA: Diagnosis not present

## 2019-08-17 DIAGNOSIS — R69 Illness, unspecified: Secondary | ICD-10-CM | POA: Diagnosis not present

## 2019-08-19 ENCOUNTER — Telehealth (INDEPENDENT_AMBULATORY_CARE_PROVIDER_SITE_OTHER): Payer: Self-pay

## 2019-08-19 ENCOUNTER — Telehealth: Payer: Self-pay | Admitting: Medical Oncology

## 2019-08-19 ENCOUNTER — Encounter: Payer: Self-pay | Admitting: Medical Oncology

## 2019-08-19 NOTE — Telephone Encounter (Signed)
There is a note from oncology in patients chart. Daughter requested letter from them first. They denied and instructed her to contact PCP.

## 2019-08-19 NOTE — Telephone Encounter (Signed)
Asking for letter with cancer diagnosis .   She also asked for letter saying he has dementia. I told her to call PCP for that letter. She voiced understanding. Letter put up front at sign in.

## 2019-08-19 NOTE — Telephone Encounter (Signed)
Patient daughter called to request a letter stating he has dementia and cancer. Patient daughter states she needs the letter in order to apply for section 8.   Please advice patient daughter [phone]

## 2019-08-22 DIAGNOSIS — Z431 Encounter for attention to gastrostomy: Secondary | ICD-10-CM | POA: Diagnosis not present

## 2019-08-22 DIAGNOSIS — E785 Hyperlipidemia, unspecified: Secondary | ICD-10-CM | POA: Diagnosis not present

## 2019-08-22 DIAGNOSIS — L89221 Pressure ulcer of left hip, stage 1: Secondary | ICD-10-CM | POA: Diagnosis not present

## 2019-08-22 DIAGNOSIS — I10 Essential (primary) hypertension: Secondary | ICD-10-CM | POA: Diagnosis not present

## 2019-08-22 DIAGNOSIS — Z7901 Long term (current) use of anticoagulants: Secondary | ICD-10-CM | POA: Diagnosis not present

## 2019-08-22 DIAGNOSIS — E43 Unspecified severe protein-calorie malnutrition: Secondary | ICD-10-CM | POA: Diagnosis not present

## 2019-08-22 DIAGNOSIS — R69 Illness, unspecified: Secondary | ICD-10-CM | POA: Diagnosis not present

## 2019-08-22 DIAGNOSIS — R590 Localized enlarged lymph nodes: Secondary | ICD-10-CM | POA: Diagnosis not present

## 2019-08-22 DIAGNOSIS — C342 Malignant neoplasm of middle lobe, bronchus or lung: Secondary | ICD-10-CM | POA: Diagnosis not present

## 2019-08-22 DIAGNOSIS — I4891 Unspecified atrial fibrillation: Secondary | ICD-10-CM | POA: Diagnosis not present

## 2019-08-24 DIAGNOSIS — C342 Malignant neoplasm of middle lobe, bronchus or lung: Secondary | ICD-10-CM | POA: Diagnosis not present

## 2019-08-24 DIAGNOSIS — I4891 Unspecified atrial fibrillation: Secondary | ICD-10-CM | POA: Diagnosis not present

## 2019-08-24 DIAGNOSIS — R69 Illness, unspecified: Secondary | ICD-10-CM | POA: Diagnosis not present

## 2019-08-24 DIAGNOSIS — I10 Essential (primary) hypertension: Secondary | ICD-10-CM | POA: Diagnosis not present

## 2019-08-24 DIAGNOSIS — Z7901 Long term (current) use of anticoagulants: Secondary | ICD-10-CM | POA: Diagnosis not present

## 2019-08-24 DIAGNOSIS — E43 Unspecified severe protein-calorie malnutrition: Secondary | ICD-10-CM | POA: Diagnosis not present

## 2019-08-24 DIAGNOSIS — E785 Hyperlipidemia, unspecified: Secondary | ICD-10-CM | POA: Diagnosis not present

## 2019-08-24 DIAGNOSIS — R590 Localized enlarged lymph nodes: Secondary | ICD-10-CM | POA: Diagnosis not present

## 2019-08-24 DIAGNOSIS — L89221 Pressure ulcer of left hip, stage 1: Secondary | ICD-10-CM | POA: Diagnosis not present

## 2019-08-24 DIAGNOSIS — Z431 Encounter for attention to gastrostomy: Secondary | ICD-10-CM | POA: Diagnosis not present

## 2019-08-24 NOTE — Telephone Encounter (Signed)
Spoke with [name] daughter regarding paperwork she will drop off information on Monday

## 2019-08-31 ENCOUNTER — Other Ambulatory Visit: Payer: Self-pay

## 2019-08-31 ENCOUNTER — Encounter (HOSPITAL_COMMUNITY): Payer: Self-pay

## 2019-08-31 ENCOUNTER — Ambulatory Visit (HOSPITAL_COMMUNITY)
Admission: RE | Admit: 2019-08-31 | Discharge: 2019-08-31 | Disposition: A | Discharge status: Home / Self Care | Payer: Medicare HMO | Source: Ambulatory Visit | Attending: Physician Assistant | Admitting: Physician Assistant

## 2019-08-31 ENCOUNTER — Inpatient Hospital Stay: Payer: Medicare HMO | Attending: Internal Medicine

## 2019-08-31 ENCOUNTER — Encounter: Payer: Self-pay | Admitting: Nutrition

## 2019-08-31 DIAGNOSIS — Z923 Personal history of irradiation: Secondary | ICD-10-CM | POA: Diagnosis not present

## 2019-08-31 DIAGNOSIS — C3490 Malignant neoplasm of unspecified part of unspecified bronchus or lung: Secondary | ICD-10-CM | POA: Diagnosis not present

## 2019-08-31 DIAGNOSIS — Z9221 Personal history of antineoplastic chemotherapy: Secondary | ICD-10-CM | POA: Insufficient documentation

## 2019-08-31 DIAGNOSIS — Z86711 Personal history of pulmonary embolism: Secondary | ICD-10-CM | POA: Insufficient documentation

## 2019-08-31 DIAGNOSIS — C342 Malignant neoplasm of middle lobe, bronchus or lung: Secondary | ICD-10-CM | POA: Diagnosis present

## 2019-08-31 DIAGNOSIS — Z8701 Personal history of pneumonia (recurrent): Secondary | ICD-10-CM | POA: Diagnosis not present

## 2019-08-31 DIAGNOSIS — C3491 Malignant neoplasm of unspecified part of right bronchus or lung: Secondary | ICD-10-CM

## 2019-08-31 LAB — CMP (CANCER CENTER ONLY)
ALT: 11 U/L (ref 0–44)
AST: 20 U/L (ref 15–41)
Albumin: 3.6 g/dL (ref 3.5–5.0)
Alkaline Phosphatase: 99 U/L (ref 38–126)
Anion gap: 9 (ref 5–15)
BUN: 11 mg/dL (ref 8–23)
CO2: 25 mmol/L (ref 22–32)
Calcium: 10.8 mg/dL — ABNORMAL HIGH (ref 8.9–10.3)
Chloride: 109 mmol/L (ref 98–111)
Creatinine: 0.97 mg/dL (ref 0.61–1.24)
GFR, Est AFR Am: >60 mL/min (ref >60)
GFR, Estimated: >60 mL/min (ref >60)
Glucose, Bld: 106 mg/dL — ABNORMAL HIGH (ref 70–99)
Potassium: 4.9 mmol/L (ref 3.5–5.1)
Sodium: 143 mmol/L (ref 135–145)
Total Bilirubin: 0.2 mg/dL — ABNORMAL LOW (ref 0.3–1.2)
Total Protein: 7.8 g/dL (ref 6.5–8.1)

## 2019-08-31 LAB — CBC WITH DIFFERENTIAL (CANCER CENTER ONLY)
Abs Immature Granulocytes: 0.01 10*3/uL (ref 0.00–0.07)
Basophils Absolute: 0 10*3/uL (ref 0.0–0.1)
Basophils Relative: 1 %
Eosinophils Absolute: 0.1 10*3/uL (ref 0.0–0.5)
Eosinophils Relative: 1 %
HCT: 36.8 % — ABNORMAL LOW (ref 39.0–52.0)
Hemoglobin: 11.9 g/dL — ABNORMAL LOW (ref 13.0–17.0)
Immature Granulocytes: 0 %
Lymphocytes Relative: 42 %
Lymphs Abs: 2.5 10*3/uL (ref 0.7–4.0)
MCH: 22.7 pg — ABNORMAL LOW (ref 26.0–34.0)
MCHC: 32.3 g/dL (ref 30.0–36.0)
MCV: 70.2 fL — ABNORMAL LOW (ref 80.0–100.0)
Monocytes Absolute: 0.6 10*3/uL (ref 0.1–1.0)
Monocytes Relative: 11 %
Neutro Abs: 2.8 10*3/uL (ref 1.7–7.7)
Neutrophils Relative %: 45 %
Platelet Count: 420 10*3/uL — ABNORMAL HIGH (ref 150–400)
RBC: 5.24 MIL/uL (ref 4.22–5.81)
RDW: 16.7 % — ABNORMAL HIGH (ref 11.5–15.5)
WBC Count: 6 10*3/uL (ref 4.0–10.5)
nRBC: 0 % (ref 0.0–0.2)

## 2019-08-31 MED ORDER — SODIUM CHLORIDE (PF) 0.9 % IJ SOLN
INTRAMUSCULAR | Status: AC
  Filled 2019-08-31: qty 50

## 2019-08-31 MED ORDER — IOHEXOL 300 MG/ML  SOLN
75.0000 mL | Freq: Once | INTRAMUSCULAR | Status: AC | PRN
  Administered 2019-08-31: 75 mL via INTRAVENOUS

## 2019-08-31 NOTE — Progress Notes (Signed)
Provided one complimentary case of ensure enlive. 

## 2019-09-01 DIAGNOSIS — C342 Malignant neoplasm of middle lobe, bronchus or lung: Secondary | ICD-10-CM | POA: Diagnosis not present

## 2019-09-01 DIAGNOSIS — E785 Hyperlipidemia, unspecified: Secondary | ICD-10-CM | POA: Diagnosis not present

## 2019-09-01 DIAGNOSIS — I4891 Unspecified atrial fibrillation: Secondary | ICD-10-CM | POA: Diagnosis not present

## 2019-09-01 DIAGNOSIS — Z431 Encounter for attention to gastrostomy: Secondary | ICD-10-CM | POA: Diagnosis not present

## 2019-09-01 DIAGNOSIS — R590 Localized enlarged lymph nodes: Secondary | ICD-10-CM | POA: Diagnosis not present

## 2019-09-01 DIAGNOSIS — I10 Essential (primary) hypertension: Secondary | ICD-10-CM | POA: Diagnosis not present

## 2019-09-01 DIAGNOSIS — R69 Illness, unspecified: Secondary | ICD-10-CM | POA: Diagnosis not present

## 2019-09-01 DIAGNOSIS — L89221 Pressure ulcer of left hip, stage 1: Secondary | ICD-10-CM | POA: Diagnosis not present

## 2019-09-01 DIAGNOSIS — E43 Unspecified severe protein-calorie malnutrition: Secondary | ICD-10-CM | POA: Diagnosis not present

## 2019-09-01 DIAGNOSIS — Z7901 Long term (current) use of anticoagulants: Secondary | ICD-10-CM | POA: Diagnosis not present

## 2019-09-05 ENCOUNTER — Encounter: Payer: Self-pay | Admitting: Internal Medicine

## 2019-09-05 ENCOUNTER — Encounter: Payer: Self-pay | Admitting: *Deleted

## 2019-09-05 ENCOUNTER — Inpatient Hospital Stay (HOSPITAL_BASED_OUTPATIENT_CLINIC_OR_DEPARTMENT_OTHER): Payer: Medicare HMO | Admitting: Internal Medicine

## 2019-09-05 ENCOUNTER — Other Ambulatory Visit: Payer: Self-pay

## 2019-09-05 VITALS — BP 115/67 | HR 108 | Temp 98.3°F | Resp 20 | Ht 74.0 in | Wt 147.6 lb

## 2019-09-05 DIAGNOSIS — C3490 Malignant neoplasm of unspecified part of unspecified bronchus or lung: Secondary | ICD-10-CM | POA: Diagnosis not present

## 2019-09-05 DIAGNOSIS — Z86711 Personal history of pulmonary embolism: Secondary | ICD-10-CM | POA: Diagnosis not present

## 2019-09-05 DIAGNOSIS — Z923 Personal history of irradiation: Secondary | ICD-10-CM | POA: Diagnosis not present

## 2019-09-05 DIAGNOSIS — C342 Malignant neoplasm of middle lobe, bronchus or lung: Secondary | ICD-10-CM | POA: Diagnosis not present

## 2019-09-05 DIAGNOSIS — C3491 Malignant neoplasm of unspecified part of right bronchus or lung: Secondary | ICD-10-CM

## 2019-09-05 DIAGNOSIS — I10 Essential (primary) hypertension: Secondary | ICD-10-CM | POA: Diagnosis not present

## 2019-09-05 DIAGNOSIS — Z9221 Personal history of antineoplastic chemotherapy: Secondary | ICD-10-CM | POA: Diagnosis not present

## 2019-09-05 DIAGNOSIS — Z8701 Personal history of pneumonia (recurrent): Secondary | ICD-10-CM | POA: Diagnosis not present

## 2019-09-05 NOTE — Progress Notes (Signed)
[place] Telephone:([phone]) [phone]   Fax:([phone]) [phone]  OFFICE PROGRESS NOTE  [name], NP [id number] [id number]  DIAGNOSIS: Stage IIIA(T3, N2, M0) non-small cell lung cancer, squamous cell carcinoma presented with large right middle lobe lung mass with postobstructive pneumonia as well as right hilar and mediastinal lymphadenopathy diagnosed in July 2020. He also has a suspicious small right pleural effusion and small indeterminate metabolic focus in the liver.   PRIOR THERAPY: Concurrent chemoradiation with Carboplatin for an AUC of 2 and Paclitaxel 45 mg/m2. First dose expected on 01/24/2019.  Status post 9 cycles.  Last treatment was given on April 25, 2019 with partial response.  CURRENT THERAPY:  Observation.  INTERVAL HISTORY: Corbitt Siefring 79 y.o. male returns to the clinic today for follow-up visit.  The patient is feeling fine today with no concerning complaints.  He is now eating regularly and they are using the PEG tube only for water.  The patient denied having any chest pain but has shortness of breath with exertion with no cough or hemoptysis.  He denied having any fever or chills.  He has no nausea, vomiting, diarrhea or constipation.  He denied having any headache or visual changes.  He is currently on observation.  He had repeat CT scan of the chest performed recently and he is here for evaluation and discussion of his scan results.   MEDICAL HISTORY: Past Medical History:  Diagnosis Date  . Dementia ([place])   . High cholesterol   . Hypertension   . Lung mass 12/2018    ALLERGIES:  has No Known Allergies.  MEDICATIONS:  Current Outpatient Medications  Medication Sig Dispense Refill  . Amino Acids-Protein Hydrolys (FEEDING SUPPLEMENT, PRO-STAT SUGAR FREE 64,) LIQD Place 30 mLs into feeding tube daily. 887 mL 3  . ferrous sulfate 325 (65 FE) MG tablet Take 325 mg by mouth 2 (two) times daily with a meal.    .  Multiple Vitamin (MULTIVITAMIN) tablet Take 1 tablet by mouth daily.    . Nutritional Supplements (FEEDING SUPPLEMENT, OSMOLITE 1.5 CAL,) LIQD Place 237 mLs into feeding tube 4 (four) times daily.  0  . Omega-3 Fatty Acids (FISH OIL) 1200 MG CAPS Take 1,200 mg by mouth daily.    . Water For Irrigation, Sterile (FREE WATER) SOLN Place 100 mLs into feeding tube every 6 (six) hours.    Alveda Reasons 20 MG TABS tablet TAKE 1 TABLET BY MOUTH DAILY WITH SUPPER 30 tablet 0   No current facility-administered medications for this visit.    SURGICAL HISTORY:  Past Surgical History:  Procedure Laterality Date  . IR GASTROSTOMY TUBE MOD SED  05/25/2019  . JOINT REPLACEMENT    . VIDEO BRONCHOSCOPY WITH ENDOBRONCHIAL ULTRASOUND N/A 12/16/2018   Procedure: VIDEO BRONCHOSCOPY WITH ENDOBRONCHIAL ULTRASOUND AND FLUROSCOPY;  Surgeon: [name], MD;  Location: [place];  Service: Pulmonary;  Laterality: N/A;    REVIEW OF SYSTEMS:  A comprehensive review of systems was negative except for: Constitutional: positive for fatigue Respiratory: positive for dyspnea on exertion Musculoskeletal: positive for muscle weakness   PHYSICAL EXAMINATION: General appearance: alert, cooperative, fatigued and no distress Head: Normocephalic, without obvious abnormality, atraumatic Neck: no adenopathy, no JVD, supple, symmetrical, trachea midline and thyroid not enlarged, symmetric, no tenderness/mass/nodules Lymph nodes: Cervical, supraclavicular, and axillary nodes normal. Resp: clear to auscultation bilaterally Back: symmetric, no curvature. ROM normal. No CVA tenderness. Cardio: regular rate and rhythm, S1, S2 normal, no murmur,  click, rub or gallop GI: soft, non-tender; bowel sounds normal; no masses,  no organomegaly Extremities: extremities normal, atraumatic, no cyanosis or edema  ECOG PERFORMANCE STATUS: 1 - Symptomatic but completely ambulatory  Blood pressure 115/67, pulse (!) 108, temperature 98.3 F (36.8 C),  temperature source Oral, resp. rate 20, height 6\' 2"  (1.88 m), weight 147 lb 9.6 oz (67 kg), SpO2 92 %.  LABORATORY DATA: Lab Results  Component Value Date   WBC 6.0 08/31/2019   HGB 11.9 (L) 08/31/2019   HCT 36.8 (L) 08/31/2019   MCV 70.2 (L) 08/31/2019   PLT 420 (H) 08/31/2019      Chemistry      Component Value Date/Time   NA 143 08/31/2019 1114   NA 145 (H) 10/29/2016 1513   K 4.9 08/31/2019 1114   CL 109 08/31/2019 1114   CO2 25 08/31/2019 1114   BUN 11 08/31/2019 1114   BUN 12 10/29/2016 1513   CREATININE 0.97 08/31/2019 1114      Component Value Date/Time   CALCIUM 10.8 (H) 08/31/2019 1114   ALKPHOS 99 08/31/2019 1114   AST 20 08/31/2019 1114   ALT 11 08/31/2019 1114   BILITOT 0.2 (L) 08/31/2019 1114       RADIOGRAPHIC STUDIES: CT Chest W Contrast  Result Date: 08/31/2019 CLINICAL DATA:  Restaging lung cancer. EXAM: CT CHEST WITH CONTRAST TECHNIQUE: Multidetector CT imaging of the chest was performed during intravenous contrast administration. CONTRAST:  40mL OMNIPAQUE IOHEXOL 300 MG/ML  SOLN COMPARISON:  05/26/2019. FINDINGS: Cardiovascular: Atherosclerotic calcification of the aorta and coronary arteries. Heart size normal. No pericardial effusion. Mediastinum/Nodes: No pathologically enlarged mediastinal, left hilar or axillary lymph nodes. There is soft tissue thickening in the right hilum, similar to the prior exam. Esophagus is grossly unremarkable. Lungs/Pleura: Centrilobular emphysema. Decrease in size of an area of cavitation in the central right middle lobe (8/89). Measurement is difficult with persistent right middle lobe collapse. Increasing ground-glass in the inferior right upper lobe and right lower lobe which obscures a previously measured nodule in the inferior right upper lobe. A separate nodule in the posterior segment right upper lobe on the prior study is no longer visualized. New small right pleural effusion. 5 mm peripheral left lower lobe nodule  (8/112), stable. Mild chronic appearing volume loss in the posteromedial left lower lobe. No left pleural fluid. Airway is unremarkable. Upper Abdomen: Visualized portions of the liver, gallbladder, adrenal glands, kidneys, spleen, pancreas, stomach and bowel are grossly unremarkable. No upper abdominal adenopathy. Musculoskeletal: Degenerative changes in the spine. No worrisome lytic or sclerotic lesions. IMPRESSION: 1. Continued regression of a cavitary lesion in the perihilar right middle lobe. Persistent associated right middle lobe collapse. Expected changes of radiation therapy in the right upper and right lower lobes. Previously measured right upper lobe nodules have resolved or are obscured. 2. Small right pleural effusion, new. 3. 5 mm left lower lobe nodule, stable. 4.  Aortic atherosclerosis (ICD10-I70.0). 5.  Emphysema (ICD10-J43.9). Electronically Signed   By: Lorin Picket M.D.   On: 08/31/2019 14:12    ASSESSMENT AND PLAN: This is a very pleasant 79 years old African-American male with a stage IIIA non-small cell lung cancer, squamous cell carcinoma. The patient started a course of concurrent chemoradiation with weekly carboplatin for AUC of 2 and paclitaxel 45 mg/M2 status post 9 cycles but this treatment was interrupted secondary to recent hospitalization for pneumonia and pulmonary embolism. He has partial response to this treatment.  The patient is currently on  observation. He had repeat CT scan of the chest performed recently.  I personally and independently reviewed the scans and discussed the results with the patient and his daughter. His scan showed further improvement of his disease. I recommended for the patient to continue on observation with repeat CT scan of the chest in 3 months. For the nutrition, the patient is now able to eat and drink on his own.  We will request the PEG tube to be removed by interventional radiology. He was advised to call immediately if he has any  concerning symptoms in the interval. The patient voices understanding of current disease status and treatment options and is in agreement with the current care plan.  All questions were answered. The patient knows to call the clinic with any problems, questions or concerns. We can certainly see the patient much sooner if necessary.  Disclaimer: This note was dictated with voice recognition software. Similar sounding words can inadvertently be transcribed and may not be corrected upon review.

## 2019-09-05 NOTE — Progress Notes (Signed)
Oncology Nurse Navigator Documentation  Oncology Nurse Navigator Flowsheets 09/05/2019  Abnormal Finding Date -  Confirmed Diagnosis Date -  Diagnosis Status -  Planned Course of Treatment -  Phase of Treatment -  Chemo/Radiation Concurrent Actual Start Date: -  Chemo/Radiation Concurrent Expected End Date: -  Navigator Follow Up Date: -  Navigator Follow Up Reason: -  Navigation Complete Date: 09/05/2019  Post Navigation: Continue to Follow Patient? No  Reason Not Navigating Patient: No Treatment, Observation Only  Navigator Location [facility]  Referral Date to RadOnc/MedOnc -  Navigator Encounter Type Clinic/MDC/I spoke with Richard [name] and his daughter today.  He is doing well without complaints.  No barriers identified at this time.   Treatment Initiated Date -  Patient Visit Type MedOnc  Treatment Phase Follow-up  Barriers/Navigation Needs No Barriers At This Time  Education -  Interventions Psycho-Social Support  Acuity Level 1-No Barriers  Coordination of Care -  Education Method -  Time Spent with Patient 15

## 2019-09-06 ENCOUNTER — Telehealth: Payer: Self-pay | Admitting: Internal Medicine

## 2019-09-06 NOTE — Telephone Encounter (Signed)
Scheduled per los. Called and left msg. Mailed printout  °

## 2019-09-08 ENCOUNTER — Other Ambulatory Visit: Payer: Self-pay

## 2019-09-08 ENCOUNTER — Ambulatory Visit (HOSPITAL_COMMUNITY)
Admission: RE | Admit: 2019-09-08 | Discharge: 2019-09-08 | Disposition: A | Discharge status: Home / Self Care | Payer: Medicare HMO | Source: Ambulatory Visit | Attending: Internal Medicine | Admitting: Internal Medicine

## 2019-09-08 DIAGNOSIS — C3490 Malignant neoplasm of unspecified part of unspecified bronchus or lung: Secondary | ICD-10-CM

## 2019-09-08 DIAGNOSIS — Z431 Encounter for attention to gastrostomy: Secondary | ICD-10-CM | POA: Diagnosis not present

## 2019-09-08 DIAGNOSIS — E46 Unspecified protein-calorie malnutrition: Secondary | ICD-10-CM | POA: Diagnosis not present

## 2019-09-08 HISTORY — PX: IR GASTROSTOMY TUBE REMOVAL: IMG5492

## 2019-09-08 MED ORDER — LIDOCAINE VISCOUS HCL 2 % MT SOLN
OROMUCOSAL | Status: AC
  Filled 2019-09-08: qty 15

## 2019-09-08 NOTE — Discharge Instructions (Signed)
PEG Tube Removal, Care After This sheet gives you information about how to care for yourself after your procedure. Your health care provider may also give you more specific instructions. If you have problems or questions, contact your health care provider. What can I expect after the procedure? After the procedure, it is common to have:  Mild discomfort at the opening in your skin where the tube was removed (tube removal site).  A small amount of drainage from the opening. Follow these instructions at home: Care of the tube removal site  Follow instructions from your health care provider about how to take care of your tube removal site. Make sure you: ? Wash your hands with soap and water before you change your bandage (dressing). If soap and water are not available, use hand sanitizer. ? Change your dressing as told by your health care provider.  Check your tube removal site every day for signs of infection. Check for: ? Redness, swelling, or pain. ? Fluid or blood. ? Warmth. ? Pus or a bad smell.  Do not take baths, swim, or use a hot tub until your health care provider approves. Ask your health care provider if you may take showers. You may only be allowed to take sponge baths. Activity   Return to your normal activities as told by your health care provider. Ask your health care provider what activities are safe for you.  Do not lift anything that is heavier than 10 lb (4.5 kg), or the limit that you are told, until your health care provider says that it is safe. General instructions  Take over-the-counter and prescription medicines only as told by your health care provider.  Follow instructions from your health care provider about eating and drinking.  Keep all follow-up visits as told by your health care provider. This is important. Contact a health care provider if:  You have a fever.  You have pain in your abdomen.  You have redness, swelling, or pain around your tube  removal site.  You have fluid or blood coming from your tube removal site.  Your tube removal site feels warm to the touch.  You have pus or a bad smell coming from your tube removal site.  You have nausea or vomiting. Get help right away if:  You have persistent bleeding from your tube removal site.  You have severe pain in your abdomen.  You are not able to eat or drink anything by mouth. Summary  Follow instructions from your health care provider about how to take care of your tube removal site.  Do not take baths, swim, or use a hot tub until your health care provider approves. Ask your health care provider if you may take showers. You may only be allowed to take sponge baths.  Check your tube removal site every day for signs of infection.  Return to your normal activities as told by your health care provider. This information is not intended to replace advice given to you by your health care provider. Make sure you discuss any questions you have with your health care provider. Document Revised: 05/15/2017 Document Reviewed: 02/02/2017 Elsevier Patient Education  2020 [employer].

## 2019-09-08 NOTE — Procedures (Signed)
PROCEDURE SUMMARY:  Successful 20 Fr pull-through gastrostomy tube removal. No immediate complications.  EBL < 2 mL. Dressing (gauze and tape) applied to site. Patient tolerated well.  Please see imaging section of Epic for full dictation.   Claris Pong Letitia Sabala PA-C 09/08/2019 11:19 AM

## 2019-09-09 ENCOUNTER — Encounter (INDEPENDENT_AMBULATORY_CARE_PROVIDER_SITE_OTHER): Payer: Self-pay | Admitting: Primary Care

## 2019-09-09 ENCOUNTER — Ambulatory Visit (INDEPENDENT_AMBULATORY_CARE_PROVIDER_SITE_OTHER): Payer: Medicare HMO | Admitting: Primary Care

## 2019-09-09 VITALS — BP 81/47 | HR 51 | Temp 97.3°F | Ht 74.0 in | Wt 148.8 lb

## 2019-09-09 DIAGNOSIS — G309 Alzheimer's disease, unspecified: Secondary | ICD-10-CM

## 2019-09-09 DIAGNOSIS — Z029 Encounter for administrative examinations, unspecified: Secondary | ICD-10-CM | POA: Diagnosis not present

## 2019-09-09 DIAGNOSIS — F0280 Dementia in other diseases classified elsewhere without behavioral disturbance: Secondary | ICD-10-CM | POA: Diagnosis not present

## 2019-09-09 DIAGNOSIS — F320 Major depressive disorder, single episode, mild: Secondary | ICD-10-CM

## 2019-09-09 DIAGNOSIS — R69 Illness, unspecified: Secondary | ICD-10-CM | POA: Diagnosis not present

## 2019-09-12 ENCOUNTER — Other Ambulatory Visit: Payer: Self-pay | Admitting: Internal Medicine

## 2019-09-12 DIAGNOSIS — I2699 Other pulmonary embolism without acute cor pulmonale: Secondary | ICD-10-CM

## 2019-09-12 MED FILL — XARELTO 20 MG TABLET: 20 | 30 days supply | Qty: 30 | Fill #0

## 2019-09-13 NOTE — Progress Notes (Signed)
Established Patient Office Visit  Subjective:  Patient ID: Richard [name], male    DOB: 1941/01/14  Age: 79 y.o. MRN: [id number]  CC:  Chief Complaint  Patient presents with  . guardianship forms    HPI Richard [name] presents for with his daughter in a wheel chair. Patient has brought in a form to assist with in regards to his condition and ability to care for self. Provider portion completed the remainder she will take to court , to apply for guardianship.   Past Medical History:  Diagnosis Date  . Dementia ([place])   . High cholesterol   . Hypertension   . Lung mass 12/2018    Past Surgical History:  Procedure Laterality Date  . IR GASTROSTOMY TUBE MOD SED  05/25/2019  . IR GASTROSTOMY TUBE REMOVAL  09/08/2019  . JOINT REPLACEMENT    . VIDEO BRONCHOSCOPY WITH ENDOBRONCHIAL ULTRASOUND N/A 12/16/2018   Procedure: VIDEO BRONCHOSCOPY WITH ENDOBRONCHIAL ULTRASOUND AND FLUROSCOPY;  Surgeon: [name], MD;  Location: [facility];  Service: Pulmonary;  Laterality: N/A;    Family History  Problem Relation Age of Onset  . Stomach cancer Mother   . COPD Father   . Lung cancer Brother   . Lung cancer Brother     Social History   Socioeconomic History  . Marital status: Divorced    Spouse name: Not on file  . Number of children: Not on file  . Years of education: Not on file  . Highest education level: Not on file  Occupational History  . Not on file  Tobacco Use  . Smoking status: Former Smoker    Packs/day: 1.00    Years: 60.00    Pack years: 60.00    Types: Cigarettes  . Smokeless tobacco: Never Used  Substance and Sexual Activity  . Alcohol use: Yes    Comment: occasional  . Drug use: No  . Sexual activity: Not on file  Other Topics Concern  . Not on file  Social History Narrative  . Not on file   Social Determinants of Health   Financial Resource Strain:   . Difficulty of Paying Living Expenses:   Food Insecurity:   . Worried About Charity fundraiser in the  Last Year:   . Arboriculturist in the Last Year:   Transportation Needs:   . Film/video editor (Medical):   Marland Kitchen Lack of Transportation (Non-Medical):   Physical Activity:   . Days of Exercise per Week:   . Minutes of Exercise per Session:   Stress:   . Feeling of Stress :   Social Connections:   . Frequency of Communication with Friends and Family:   . Frequency of Social Gatherings with Friends and Family:   . Attends Religious Services:   . Active Member of Clubs or Organizations:   . Attends Archivist Meetings:   Marland Kitchen Marital Status:   Intimate Partner Violence:   . Fear of Current or Ex-Partner:   . Emotionally Abused:   Marland Kitchen Physically Abused:   . Sexually Abused:     Outpatient Medications Prior to Visit  Medication Sig Dispense Refill  . Amino Acids-Protein Hydrolys (FEEDING SUPPLEMENT, PRO-STAT SUGAR FREE 64,) LIQD Place 30 mLs into feeding tube daily. 887 mL 3  . ferrous sulfate 325 (65 FE) MG tablet Take 325 mg by mouth 2 (two) times daily with a meal.    . Multiple Vitamin (MULTIVITAMIN) tablet Take 1 tablet by mouth daily.    Marland Kitchen  Nutritional Supplements (FEEDING SUPPLEMENT, OSMOLITE 1.5 CAL,) LIQD Place 237 mLs into feeding tube 4 (four) times daily.  0  . Omega-3 Fatty Acids (FISH OIL) 1200 MG CAPS Take 1,200 mg by mouth daily.    . Water For Irrigation, Sterile (FREE WATER) SOLN Place 100 mLs into feeding tube every 6 (six) hours.    Alveda Reasons 20 MG TABS tablet TAKE 1 TABLET BY MOUTH DAILY WITH SUPPER 30 tablet 0   No facility-administered medications prior to visit.    No Known Allergies  ROS Review of Systems  Constitutional: Positive for activity change.  Genitourinary:       Incontinent of bowel and bladder at times  Musculoskeletal: Positive for gait problem.  Psychiatric/Behavioral: Positive for confusion.  All other systems reviewed and are negative.     Objective:    Physical Exam  Constitutional: He appears well-developed and  well-nourished.  Thin built  HENT:  Head: Normocephalic.  Cardiovascular: Normal rate and regular rhythm.  Pulmonary/Chest: Effort normal and breath sounds normal.  Abdominal: Bowel sounds are normal.  Musculoskeletal:     Cervical back: Normal range of motion.     Comments: Lower extremity weakness unable to walk without assistance   Psychiatric: His behavior is normal.    BP (!) 81/47 (BP Location: Left Arm, Patient Position: Sitting, Cuff Size: Small)   Pulse (!) 51   Temp (!) 97.3 F (36.3 C) (Temporal)   Ht 6\' 2"  (1.88 m)   Wt 148 lb 12.8 oz (67.5 kg)   SpO2 97%   BMI 19.10 kg/m  Wt Readings from Last 3 Encounters:  09/09/19 148 lb 12.8 oz (67.5 kg)  09/05/19 147 lb 9.6 oz (67 kg)  05/19/19 134 lb 4.2 oz (60.9 kg)     Health Maintenance Due  Topic Date Due  . PNA vac Low Risk Adult (1 of 2 - PCV13) Never done    There are no preventive care reminders to display for this patient.  Lab Results  Component Value Date   TSH 2.966 02/04/2019   Lab Results  Component Value Date   WBC 6.0 08/31/2019   HGB 11.9 (L) 08/31/2019   HCT 36.8 (L) 08/31/2019   MCV 70.2 (L) 08/31/2019   PLT 420 (H) 08/31/2019   Lab Results  Component Value Date   NA 143 08/31/2019   K 4.9 08/31/2019   CO2 25 08/31/2019   GLUCOSE 106 (H) 08/31/2019   BUN 11 08/31/2019   CREATININE 0.97 08/31/2019   BILITOT 0.2 (L) 08/31/2019   ALKPHOS 99 08/31/2019   AST 20 08/31/2019   ALT 11 08/31/2019   PROT 7.8 08/31/2019   ALBUMIN 3.6 08/31/2019   CALCIUM 10.8 (H) 08/31/2019   ANIONGAP 9 08/31/2019   Lab Results  Component Value Date   CHOL 252 (H) 10/29/2016   Lab Results  Component Value Date   HDL 62 10/29/2016   Lab Results  Component Value Date   LDLCALC 152 (H) 10/29/2016   Lab Results  Component Value Date   TRIG 188 (H) 10/29/2016   Lab Results  Component Value Date   CHOLHDL 4.1 10/29/2016   No results found for: HGBA1C    Assessment & Plan:  Richard [name] was seen  today for guardianship forms.  Diagnoses and all orders for this visit:  Alzheimer's dementia without behavioral disturbance, unspecified timing of dementia onset ([place]) Paper work completed at this visit to include notes and medication for guardian ship.  DEPRESSION  FOLLOWED by oncology  decline in health memory loss follow upwith CSW Depression screen [facility] 2/9 09/09/2019 07/07/2019 12/31/2018  Decreased Interest 0 0 3  Down, Depressed, Hopeless 0 0 3  PHQ - 2 Score 0 0 6  Altered sleeping - - 2  Tired, decreased energy - - 3  Change in appetite - - 3  Feeling bad or failure about yourself  - - 2  Trouble concentrating - - 3  Moving slowly or fidgety/restless - - 2  Suicidal thoughts - - 0  PHQ-9 Score - - 21  Difficult doing work/chores - - -   No orders of the defined types were placed in this encounter.   Follow-up: No follow-ups on file.    [name], NP

## 2019-09-14 ENCOUNTER — Telehealth: Payer: Self-pay | Admitting: Licensed Clinical Social Worker

## 2019-09-14 NOTE — Telephone Encounter (Signed)
Call placed to patient regarding IBH referral from PCP. The call was answered; however, call was disconnected. LCSW contacted [employer] Surveyor, quantity to attempt to contact pt and schedule appointment.

## 2019-09-20 ENCOUNTER — Other Ambulatory Visit: Payer: Self-pay

## 2019-09-20 ENCOUNTER — Ambulatory Visit (INDEPENDENT_AMBULATORY_CARE_PROVIDER_SITE_OTHER): Payer: Medicare HMO | Admitting: Licensed Clinical Social Worker

## 2019-09-20 DIAGNOSIS — F0280 Dementia in other diseases classified elsewhere without behavioral disturbance: Secondary | ICD-10-CM

## 2019-09-20 DIAGNOSIS — G309 Alzheimer's disease, unspecified: Secondary | ICD-10-CM

## 2019-09-20 NOTE — BH Specialist Note (Signed)
LCSW introduced self and explained role at [facility]. Pt's daughter, Norville Dani, was informed that PCP completed an IBH referral to assist with any behavioral health and/or resource needs.   Ms. Dottavio shared that she has applied for guardianship of father. She denies the need for any assistance at this time.   LCSW strongly encouraged her to contact PCP or LCSW should any questions or concerns arise. No additional concerns noted.

## 2019-09-24 ENCOUNTER — Inpatient Hospital Stay (HOSPITAL_COMMUNITY)
Admission: EM | Admit: 2019-09-24 | Discharge: 2019-09-27 | DRG: 811 | Disposition: A | Discharge status: Home Health | Payer: Medicare HMO | Attending: Internal Medicine | Admitting: Internal Medicine

## 2019-09-24 ENCOUNTER — Emergency Department (HOSPITAL_COMMUNITY): Payer: Medicare HMO

## 2019-09-24 DIAGNOSIS — R918 Other nonspecific abnormal finding of lung field: Secondary | ICD-10-CM | POA: Diagnosis not present

## 2019-09-24 DIAGNOSIS — Z7901 Long term (current) use of anticoagulants: Secondary | ICD-10-CM

## 2019-09-24 DIAGNOSIS — E860 Dehydration: Secondary | ICD-10-CM | POA: Diagnosis present

## 2019-09-24 DIAGNOSIS — R5381 Other malaise: Secondary | ICD-10-CM | POA: Diagnosis not present

## 2019-09-24 DIAGNOSIS — Z818 Family history of other mental and behavioral disorders: Secondary | ICD-10-CM

## 2019-09-24 DIAGNOSIS — E43 Unspecified severe protein-calorie malnutrition: Secondary | ICD-10-CM | POA: Diagnosis present

## 2019-09-24 DIAGNOSIS — Z86711 Personal history of pulmonary embolism: Secondary | ICD-10-CM

## 2019-09-24 DIAGNOSIS — D649 Anemia, unspecified: Secondary | ICD-10-CM | POA: Diagnosis present

## 2019-09-24 DIAGNOSIS — G309 Alzheimer's disease, unspecified: Secondary | ICD-10-CM | POA: Diagnosis present

## 2019-09-24 DIAGNOSIS — R69 Illness, unspecified: Secondary | ICD-10-CM | POA: Diagnosis not present

## 2019-09-24 DIAGNOSIS — R195 Other fecal abnormalities: Secondary | ICD-10-CM | POA: Diagnosis not present

## 2019-09-24 DIAGNOSIS — K5900 Constipation, unspecified: Secondary | ICD-10-CM

## 2019-09-24 DIAGNOSIS — C3490 Malignant neoplasm of unspecified part of unspecified bronchus or lung: Secondary | ICD-10-CM | POA: Diagnosis not present

## 2019-09-24 DIAGNOSIS — I480 Paroxysmal atrial fibrillation: Secondary | ICD-10-CM | POA: Diagnosis present

## 2019-09-24 DIAGNOSIS — Z79899 Other long term (current) drug therapy: Secondary | ICD-10-CM

## 2019-09-24 DIAGNOSIS — Z20822 Contact with and (suspected) exposure to covid-19: Secondary | ICD-10-CM | POA: Diagnosis present

## 2019-09-24 DIAGNOSIS — C3491 Malignant neoplasm of unspecified part of right bronchus or lung: Secondary | ICD-10-CM | POA: Diagnosis not present

## 2019-09-24 DIAGNOSIS — I959 Hypotension, unspecified: Secondary | ICD-10-CM | POA: Diagnosis not present

## 2019-09-24 DIAGNOSIS — D500 Iron deficiency anemia secondary to blood loss (chronic): Principal | ICD-10-CM | POA: Diagnosis present

## 2019-09-24 DIAGNOSIS — Z801 Family history of malignant neoplasm of trachea, bronchus and lung: Secondary | ICD-10-CM

## 2019-09-24 DIAGNOSIS — I10 Essential (primary) hypertension: Secondary | ICD-10-CM | POA: Diagnosis present

## 2019-09-24 DIAGNOSIS — Z681 Body mass index (BMI) 19 or less, adult: Secondary | ICD-10-CM

## 2019-09-24 DIAGNOSIS — E785 Hyperlipidemia, unspecified: Secondary | ICD-10-CM | POA: Diagnosis present

## 2019-09-24 DIAGNOSIS — E861 Hypovolemia: Secondary | ICD-10-CM | POA: Diagnosis not present

## 2019-09-24 DIAGNOSIS — F0280 Dementia in other diseases classified elsewhere without behavioral disturbance: Secondary | ICD-10-CM | POA: Diagnosis present

## 2019-09-24 DIAGNOSIS — R627 Adult failure to thrive: Secondary | ICD-10-CM | POA: Diagnosis present

## 2019-09-24 DIAGNOSIS — E7800 Pure hypercholesterolemia, unspecified: Secondary | ICD-10-CM | POA: Diagnosis present

## 2019-09-24 DIAGNOSIS — R531 Weakness: Secondary | ICD-10-CM | POA: Diagnosis not present

## 2019-09-24 LAB — COMPREHENSIVE METABOLIC PANEL
ALT: 12 U/L (ref 0–44)
AST: 18 U/L (ref 15–41)
Albumin: 3.4 g/dL — ABNORMAL LOW (ref 3.5–5.0)
Alkaline Phosphatase: 82 U/L (ref 38–126)
Anion gap: 6 (ref 5–15)
BUN: 15 mg/dL (ref 8–23)
CO2: 28 mmol/L (ref 22–32)
Calcium: 9.2 mg/dL (ref 8.9–10.3)
Chloride: 106 mmol/L (ref 98–111)
Creatinine, Ser: 0.74 mg/dL (ref 0.61–1.24)
GFR calc Af Amer: >60 mL/min (ref >60)
GFR calc non Af Amer: >60 mL/min (ref >60)
Glucose, Bld: 155 mg/dL — ABNORMAL HIGH (ref 70–99)
Potassium: 4.1 mmol/L (ref 3.5–5.1)
Sodium: 140 mmol/L (ref 135–145)
Total Bilirubin: 0.4 mg/dL (ref 0.3–1.2)
Total Protein: 7.1 g/dL (ref 6.5–8.1)

## 2019-09-24 LAB — CBC WITH DIFFERENTIAL/PLATELET
Abs Immature Granulocytes: 0.02 10*3/uL (ref 0.00–0.07)
Basophils Absolute: 0 10*3/uL (ref 0.0–0.1)
Basophils Relative: 0 %
Eosinophils Absolute: 0 10*3/uL (ref 0.0–0.5)
Eosinophils Relative: 0 %
HCT: 25.9 % — ABNORMAL LOW (ref 39.0–52.0)
Hemoglobin: 8 g/dL — ABNORMAL LOW (ref 13.0–17.0)
Immature Granulocytes: 0 %
Lymphocytes Relative: 18 %
Lymphs Abs: 1.2 10*3/uL (ref 0.7–4.0)
MCH: 21.5 pg — ABNORMAL LOW (ref 26.0–34.0)
MCHC: 30.9 g/dL (ref 30.0–36.0)
MCV: 69.6 fL — ABNORMAL LOW (ref 80.0–100.0)
Monocytes Absolute: 0.7 10*3/uL (ref 0.1–1.0)
Monocytes Relative: 10 %
Neutro Abs: 4.8 10*3/uL (ref 1.7–7.7)
Neutrophils Relative %: 72 %
Platelets: 402 10*3/uL — ABNORMAL HIGH (ref 150–400)
RBC: 3.72 MIL/uL — ABNORMAL LOW (ref 4.22–5.81)
RDW: 16.6 % — ABNORMAL HIGH (ref 11.5–15.5)
WBC: 6.7 10*3/uL (ref 4.0–10.5)
nRBC: 0.3 % — ABNORMAL HIGH (ref 0.0–0.2)

## 2019-09-24 LAB — URINALYSIS, ROUTINE W REFLEX MICROSCOPIC
Bilirubin Urine: NEGATIVE
Glucose, UA: NEGATIVE mg/dL
Hgb urine dipstick: NEGATIVE
Ketones, ur: NEGATIVE mg/dL
Nitrite: POSITIVE — AB
Protein, ur: NEGATIVE mg/dL
Specific Gravity, Urine: 1.013 (ref 1.005–1.030)
pH: 7 (ref 5.0–8.0)

## 2019-09-24 LAB — RESPIRATORY PANEL BY RT PCR (FLU A&B, COVID)
Influenza A by PCR: NEGATIVE
Influenza B by PCR: NEGATIVE
SARS Coronavirus 2 by RT PCR: NEGATIVE

## 2019-09-24 LAB — PREPARE RBC (CROSSMATCH)

## 2019-09-24 LAB — POC OCCULT BLOOD, ED: Fecal Occult Bld: POSITIVE — AB

## 2019-09-24 MED ORDER — SODIUM CHLORIDE 0.9 % IV SOLN
10.0000 mL/h | Freq: Once | INTRAVENOUS | Status: AC
  Administered 2019-09-24: 16:00:00 10 mL/h via INTRAVENOUS

## 2019-09-24 MED ORDER — PANTOPRAZOLE SODIUM 40 MG IV SOLR
40.0000 mg | Freq: Once | INTRAVENOUS | Status: AC
  Administered 2019-09-24: 40 mg via INTRAVENOUS
  Filled 2019-09-24: qty 40

## 2019-09-24 MED ORDER — ENSURE ENLIVE PO LIQD
237.0000 mL | Freq: Three times a day (TID) | ORAL | Status: DC
  Administered 2019-09-24 – 2019-09-27 (×9): 237 mL via ORAL

## 2019-09-24 MED ORDER — ACETAMINOPHEN 650 MG RE SUPP: 650.0000 mg | Freq: Four times a day (QID) | RECTAL | Status: DC | PRN

## 2019-09-24 MED ORDER — ONDANSETRON HCL 4 MG PO TABS: 4.0000 mg | ORAL_TABLET | Freq: Four times a day (QID) | ORAL | Status: DC | PRN

## 2019-09-24 MED ORDER — ONDANSETRON HCL 4 MG/2ML IJ SOLN: 4.0000 mg | Freq: Four times a day (QID) | INTRAMUSCULAR | Status: DC | PRN

## 2019-09-24 MED ORDER — SODIUM CHLORIDE 0.9 % IV BOLUS (SEPSIS)
1000.0000 mL | Freq: Once | INTRAVENOUS | Status: AC
  Administered 2019-09-24: 1000 mL via INTRAVENOUS

## 2019-09-24 MED ORDER — FERROUS SULFATE 325 (65 FE) MG PO TABS
325.0000 mg | ORAL_TABLET | Freq: Two times a day (BID) | ORAL | Status: DC
  Administered 2019-09-25 – 2019-09-27 (×5): 325 mg via ORAL
  Filled 2019-09-24 (×5): qty 1

## 2019-09-24 MED ORDER — ADULT MULTIVITAMIN W/MINERALS CH
1.0000 | ORAL_TABLET | Freq: Every day | ORAL | Status: DC
  Administered 2019-09-25 – 2019-09-27 (×3): 1 via ORAL
  Filled 2019-09-24 (×4): qty 1

## 2019-09-24 MED ORDER — ACETAMINOPHEN 325 MG PO TABS
650.0000 mg | ORAL_TABLET | Freq: Four times a day (QID) | ORAL | Status: DC | PRN
  Administered 2019-09-27: 03:00:00 650 mg via ORAL
  Filled 2019-09-24: qty 2

## 2019-09-24 NOTE — ED Notes (Signed)
Consent form for blood signed by patient's daughter with this RN and patient present.

## 2019-09-24 NOTE — H&P (Signed)
History and Physical    Richard [name] ZOX:[id number] DOB: 29-Dec-1940 DOA: 09/24/2019  PCP: Richard Perna, NP Patient coming from: Home  I have personally briefly reviewed patient's old medical records in [place]  Chief Complaint: Fatigue  HPI: Richard [name] is a 79 y.o. male with medical history significant of stage IIIa non-small cell lung cancer, hypertension, hyperlipidemia, dementia who presents with worsening fatigue  Patient is accompanied by daughter who provides most of the history as she reports her father has dementia.  She reports that she has noticed her father appears more lethargic and less active for 1 week now.  She initially attributes it to her feeding tube being removed stating that he is eating well but not drinking as much water and she thinks that may be playing a role.  She has not noticed any overt bleeding or black stools.  She actually has not had a stool in a week but reports he may have had a bowel movement in her absence when she goes to the store.  She reports that he has not had a recent colonoscopy.  She reports that he has not had any fevers or chills recently.  He has not had any nausea/vomiting, sob, cough.  Patient unable to give reliable history.  Denies pain though appeared to have some discomfort while examining PEG site.  Review of Systems: As per HPI otherwise 10 point review of systems negative.    Past Medical History:  Diagnosis Date  . Dementia ([place])   . High cholesterol   . Hypertension   . Lung mass 12/2018    Past Surgical History:  Procedure Laterality Date  . IR GASTROSTOMY TUBE MOD SED  05/25/2019  . IR GASTROSTOMY TUBE REMOVAL  09/08/2019  . JOINT REPLACEMENT    . VIDEO BRONCHOSCOPY WITH ENDOBRONCHIAL ULTRASOUND N/A 12/16/2018   Procedure: VIDEO BRONCHOSCOPY WITH ENDOBRONCHIAL ULTRASOUND AND FLUROSCOPY;  Surgeon: [name], MD;  Location: [facility];  Service: Pulmonary;  Laterality: N/A;     reports that he has quit  smoking. His smoking use included cigarettes. He has a 60.00 pack-year smoking history. He has never used smokeless tobacco. He reports current alcohol use. He reports that he does not use drugs.  No Known Allergies  Family History  Problem Relation Age of Onset  . Stomach cancer Mother   . COPD Father   . Lung cancer Brother   . Lung cancer Brother      Prior to Admission medications   Medication Sig Start Date End Date Taking? Authorizing Provider  ferrous sulfate 325 (65 FE) MG tablet Take 325 mg by mouth 2 (two) times daily with a meal.   Yes [provider]  Multiple Vitamin (MULTIVITAMIN) tablet Take 1 tablet by mouth daily.   Yes [provider]  XARELTO 20 MG TABS tablet TAKE 1 TABLET BY MOUTH DAILY WITH SUPPER Patient taking differently: Take 20 mg by mouth daily with supper.  09/12/19  Yes Curt Bears, MD  Amino Acids-Protein Hydrolys (FEEDING SUPPLEMENT, PRO-STAT SUGAR FREE 64,) LIQD Place 30 mLs into feeding tube daily. Patient not taking: Reported on 09/24/2019 06/14/19   Richard Perna, NP  Nutritional Supplements (FEEDING SUPPLEMENT, OSMOLITE 1.5 CAL,) LIQD Place 237 mLs into feeding tube 4 (four) times daily. Patient not taking: Reported on 09/24/2019 05/30/19   Alma Friendly, MD  Water For Irrigation, Sterile (FREE WATER) SOLN Place 100 mLs into feeding tube every 6 (six) hours. Patient not taking: Reported on 09/24/2019  05/30/19   Alma Friendly, MD    Physical Exam: Vitals:   09/24/19 1145 09/24/19 1541 09/24/19 1600 09/24/19 1818  BP: 107/69 118/62 120/62 (!) 109/57  Pulse: 94 85 83 80  Resp: 18 (!) 25 (!) 21 18  Temp: (!) 97.5 F (36.4 C)     TempSrc: Oral     SpO2: 97% 100% 95% 98%    Vitals:   09/24/19 1145 09/24/19 1541 09/24/19 1600 09/24/19 1818  BP: 107/69 118/62 120/62 (!) 109/57  Pulse: 94 85 83 80  Resp: 18 (!) 25 (!) 21 18  Temp: (!) 97.5 F (36.4 C)     TempSrc: Oral     SpO2: 97% 100% 95% 98%      Constitutional: NAD, calm, comfortable Eyes: PERRL, lids and conjunctivae normal ENMT: Mucous membranes are moist.  Neck: normal, supple, no masses, no thyromegaly Respiratory: clear to auscultation bilaterally, no wheezing, no crackles. Normal respiratory effort. No accessory muscle use.  Cardiovascular: Regular rate and rhythm, no murmurs / rubs / gallops. No extremity edema. 2+ pedal pulses. No carotid bruits.  Abdomen: no tenderness, no masses palpated. No hepatosplenomegaly. Bowel sounds positive. Prior PEG site healing well. Dressing c/d/i Musculoskeletal: no clubbing / cyanosis. No joint deformity upper and lower extremities. Good ROM, no contractures. Normal muscle tone.  Skin: no rashes, lesions, ulcers. No induration Neurologic: CN 2-12 grossly intact. Sensation and Strength grossly non-focal Psychiatric: Normal judgment and insight. Alert and oriented x 3. Normal mood.   Labs on Admission: I have personally reviewed following labs and imaging studies  CBC: Recent Labs  Lab 09/24/19 1310  WBC 6.7  NEUTROABS 4.8  HGB 8.0*  HCT 25.9*  MCV 69.6*  PLT 025*   Basic Metabolic Panel: Recent Labs  Lab 09/24/19 1310  NA 140  K 4.1  CL 106  CO2 28  GLUCOSE 155*  BUN 15  CREATININE 0.74  CALCIUM 9.2   GFR: CrCl cannot be calculated (Unknown ideal weight.). Liver Function Tests: Recent Labs  Lab 09/24/19 1310  AST 18  ALT 12  ALKPHOS 82  BILITOT 0.4  PROT 7.1  ALBUMIN 3.4*   No results for input(s): LIPASE, AMYLASE in the last 168 hours. No results for input(s): AMMONIA in the last 168 hours. Coagulation Profile: No results for input(s): INR, PROTIME in the last 168 hours. Cardiac Enzymes: No results for input(s): CKTOTAL, CKMB, CKMBINDEX, TROPONINI in the last 168 hours. BNP (last 3 results) No results for input(s): PROBNP in the last 8760 hours. HbA1C: No results for input(s): HGBA1C in the last 72 hours. CBG: No results for input(s): GLUCAP in  the last 168 hours. Lipid Profile: No results for input(s): CHOL, HDL, LDLCALC, TRIG, CHOLHDL, LDLDIRECT in the last 72 hours. Thyroid Function Tests: No results for input(s): TSH, T4TOTAL, FREET4, T3FREE, THYROIDAB in the last 72 hours. Anemia Panel: No results for input(s): VITAMINB12, FOLATE, FERRITIN, TIBC, IRON, RETICCTPCT in the last 72 hours. Urine analysis:    Component Value Date/Time   COLORURINE YELLOW 09/24/2019 1310   APPEARANCEUR CLOUDY (A) 09/24/2019 1310   LABSPEC 1.013 09/24/2019 1310   PHURINE 7.0 09/24/2019 1310   GLUCOSEU NEGATIVE 09/24/2019 1310   HGBUR NEGATIVE 09/24/2019 1310   BILIRUBINUR NEGATIVE 09/24/2019 1310   KETONESUR NEGATIVE 09/24/2019 1310   PROTEINUR NEGATIVE 09/24/2019 1310   NITRITE POSITIVE (A) 09/24/2019 1310   LEUKOCYTESUR SMALL (A) 09/24/2019 1310    Radiological Exams on Admission: DG Chest Port 1 View  Result Date: 09/24/2019  CLINICAL DATA:  History of lung cancer. Change in mental status. EXAM: PORTABLE CHEST 1 VIEW COMPARISON:  Chest x-ray May 23, 2019. CT scan August 31, 2019. FINDINGS: Opacity in the right base is mildly worsened compared to May 23, 2019. The heart, hila, and mediastinum are stable. No other pulmonary infiltrates. No pneumothorax. No other acute abnormalities. IMPRESSION: 1. Opacity in the right base is mildly more prominent compared to May 23, 2019. While some of these changes are consistent with the patient's known malignancy and treatment, a superimposed infiltrate such as pneumonia or aspiration is not excluded. Electronically Signed   By: Dorise Bullion III M.D   On: 09/24/2019 12:22    EKG: Independently reviewed.  Assessment/Plan Doniven Cordial is a 79 y.o. male with medical history significant of stage IIIa non-small cell lung cancer, hypertension, hyperlipidemia, dementia who presents with worsening fatigue and weakness, noted to have new drop in Hgb to 8.0 and presentation concerning for symptomatic  anemia.  # Symptomatic anemia - suspect subacute blood loss anemia, FOBT was obtained in ER and was positive.  D/w Dr. Therisa Doyne of GI, okay for patient to eat today, they will evaluate in AM - Held Xarelto - will transfuse 1 unit (ordered in ER) - continue iron supplementation  # Paroxysmal Atrial Fibrillation - Held Xarelto due to above, concern for bleed and new anemia - rate controlled, not on BB  # NSCLC Stage IIIa - patient follows with Dr. Julien Nordmann; notified of presentation by ER and had recommend admission for above - no acute issues  # Alzheimer's Dementia - delirium precautions  # Severe protein calorie malnutrition - now on PO intake - continue supplements  # Hx of PE - Xarelto presently held  DVT prophylaxis: SCDs Code Status: Full Family Communication: Daughter at bedside, anxious affect/ appears to have disconnect about broader picture of his care Consults called: GI, Dr. Therisa Doyne Admission status: obs   Truddie Hidden MD [facility] Pager [phone]  If 7PM-7AM, please contact night-coverage [email or web address] Password [facility]  09/24/2019, 7:19 PM

## 2019-09-24 NOTE — ED Provider Notes (Signed)
[facility] DEPT Provider Note   CSN: [id number] Arrival date & time: 09/24/19  1133     History Chief Complaint  Patient presents with  . Generalized Weakness    Richard [name] is a 79 y.o. male.  Patient presents with weakness.  Patient has a history of lung cancer and is treated by Dr. Julien Nordmann  The history is provided by the patient and medical records. No language interpreter was used.  Weakness Severity:  Mild Onset quality:  Sudden Timing:  Constant Progression:  Waxing and waning Chronicity:  New Context: not alcohol use   Relieved by:  Nothing Worsened by:  Nothing Ineffective treatments:  None tried Associated symptoms: no abdominal pain, no chest pain, no cough, no diarrhea, no frequency, no headaches and no seizures        Past Medical History:  Diagnosis Date  . Dementia ([place])   . High cholesterol   . Hypertension   . Lung mass 12/2018    Patient Active Problem List   Diagnosis Date Noted  . Paroxysmal atrial fibrillation ([place]) 07/06/2019  . Pressure injury of skin 05/21/2019  . Anorexia 05/20/2019  . HCAP (healthcare-associated pneumonia) 05/18/2019  . Protein-calorie malnutrition, severe ([place]) 05/18/2019  . Sepsis ([place]) 05/08/2019  . History of pulmonary embolism 03/30/2019  . Pure hypercholesterolemia 03/30/2019  . SOB (shortness of breath) 03/30/2019  . Atrial fibrillation with RVR ([place])   . Palliative care by specialist   . DNR (do not resuscitate) discussion   . Severe sepsis ([place]) 01/30/2019  . Acute pulmonary embolism ([place]) 01/30/2019  . Acute respiratory failure with hypoxia ([place]) 01/30/2019  . AKI (acute kidney injury) ([place]) 01/30/2019  . Poor appetite 01/05/2019  . Goals of care, counseling/discussion 12/28/2018  . Encounter for antineoplastic chemotherapy 12/28/2018  . 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 Dementia without behavioral disturbance ([place]) 12/16/2018  . Lung  mass 12/15/2018  . Pelvic mass in male 12/15/2018  . Postobstructive pneumonia 12/15/2018  . Hypertension     Past Surgical History:  Procedure Laterality Date  . IR GASTROSTOMY TUBE MOD SED  05/25/2019  . IR GASTROSTOMY TUBE REMOVAL  09/08/2019  . JOINT REPLACEMENT    . VIDEO BRONCHOSCOPY WITH ENDOBRONCHIAL ULTRASOUND N/A 12/16/2018   Procedure: VIDEO BRONCHOSCOPY WITH ENDOBRONCHIAL ULTRASOUND AND FLUROSCOPY;  Surgeon: [name], MD;  Location: [facility];  Service: Pulmonary;  Laterality: N/A;       Family History  Problem Relation Age of Onset  . Stomach cancer Mother   . COPD Father   . Lung cancer Brother   . Lung cancer Brother     Social History   Tobacco Use  . Smoking status: Former Smoker    Packs/day: 1.00    Years: 60.00    Pack years: 60.00    Types: Cigarettes  . Smokeless tobacco: Never Used  Substance Use Topics  . Alcohol use: Yes    Comment: occasional  . Drug use: No    Home Medications Prior to Admission medications   Medication Sig Start Date End Date Taking? Authorizing Provider  ferrous sulfate 325 (65 FE) MG tablet Take 325 mg by mouth 2 (two) times daily with a meal.   Yes [provider]  Multiple Vitamin (MULTIVITAMIN) tablet Take 1 tablet by mouth daily.   Yes [provider]  XARELTO 20 MG TABS tablet TAKE 1 TABLET BY MOUTH DAILY WITH SUPPER Patient taking differently: Take 20 mg by mouth daily  with supper.  09/12/19  Yes Curt Bears, MD  Amino Acids-Protein Hydrolys (FEEDING SUPPLEMENT, PRO-STAT SUGAR FREE 64,) LIQD Place 30 mLs into feeding tube daily. Patient not taking: Reported on 09/24/2019 06/14/19   [name], NP  Nutritional Supplements (FEEDING SUPPLEMENT, OSMOLITE 1.5 CAL,) LIQD Place 237 mLs into feeding tube 4 (four) times daily. Patient not taking: Reported on 09/24/2019 05/30/19   Alma Friendly, MD  Water For Irrigation, Sterile (FREE WATER) SOLN Place 100 mLs into feeding tube every 6  (six) hours. Patient not taking: Reported on 09/24/2019 05/30/19   Alma Friendly, MD    Allergies    Patient has no known allergies.  Review of Systems   Review of Systems  Constitutional: Negative for appetite change and fatigue.  HENT: Negative for congestion, ear discharge and sinus pressure.   Eyes: Negative for discharge.  Respiratory: Negative for cough.   Cardiovascular: Negative for chest pain.  Gastrointestinal: Negative for abdominal pain and diarrhea.  Genitourinary: Negative for frequency and hematuria.  Musculoskeletal: Negative for back pain.  Skin: Negative for rash.  Neurological: Positive for weakness. Negative for seizures and headaches.  Psychiatric/Behavioral: Negative for hallucinations.    Physical Exam Updated Vital Signs BP 107/69 (BP Location: Left Arm)   Pulse 94   Temp (!) 97.5 F (36.4 C) (Oral)   Resp 18   SpO2 97%   Physical Exam Vitals and nursing note reviewed.  Constitutional:      Appearance: He is well-developed.  HENT:     Head: Normocephalic.     Nose: Nose normal.  Eyes:     General: No scleral icterus.    Conjunctiva/sclera: Conjunctivae normal.  Neck:     Thyroid: No thyromegaly.  Cardiovascular:     Rate and Rhythm: Normal rate and regular rhythm.     Heart sounds: No murmur. No friction rub. No gallop.   Pulmonary:     Breath sounds: No stridor. No wheezing or rales.  Chest:     Chest wall: No tenderness.  Abdominal:     General: There is no distension.     Tenderness: There is no abdominal tenderness. There is no rebound.  Genitourinary:    Comments: Rectal exam heme positive brown stool Musculoskeletal:        General: Normal range of motion.     Cervical back: Neck supple.  Lymphadenopathy:     Cervical: No cervical adenopathy.  Skin:    Findings: No erythema or rash.  Neurological:     Mental Status: He is alert and oriented to person, place, and time.     Motor: No abnormal muscle tone.      Coordination: Coordination normal.  Psychiatric:        Behavior: Behavior normal.     ED Results / Procedures / Treatments   Labs (all labs ordered are listed, but only abnormal results are displayed) Labs Reviewed  CBC WITH DIFFERENTIAL/PLATELET - Abnormal; Notable for the following components:      Result Value   RBC 3.72 (*)    Hemoglobin 8.0 (*)    HCT 25.9 (*)    MCV 69.6 (*)    MCH 21.5 (*)    RDW 16.6 (*)    Platelets 402 (*)    nRBC 0.3 (*)    All other components within normal limits  COMPREHENSIVE METABOLIC PANEL - Abnormal; Notable for the following components:   Glucose, Bld 155 (*)    Albumin 3.4 (*)  All other components within normal limits  RESPIRATORY PANEL BY RT PCR (FLU A&B, COVID)  URINALYSIS, ROUTINE W REFLEX MICROSCOPIC  OCCULT BLOOD X 1 CARD TO LAB, STOOL  PREPARE RBC (CROSSMATCH)    EKG None  Radiology DG Chest Port 1 View  Result Date: 09/24/2019 CLINICAL DATA:  History of lung cancer. Change in mental status. EXAM: PORTABLE CHEST 1 VIEW COMPARISON:  Chest x-ray May 23, 2019. CT scan August 31, 2019. FINDINGS: Opacity in the right base is mildly worsened compared to May 23, 2019. The heart, hila, and mediastinum are stable. No other pulmonary infiltrates. No pneumothorax. No other acute abnormalities. IMPRESSION: 1. Opacity in the right base is mildly more prominent compared to May 23, 2019. While some of these changes are consistent with the patient's known malignancy and treatment, a superimposed infiltrate such as pneumonia or aspiration is not excluded. Electronically Signed   By: Dorise Bullion III M.D   On: 09/24/2019 12:22    Procedures Procedures (including critical care time)  Medications Ordered in ED Medications  pantoprazole (PROTONIX) injection 40 mg (has no administration in time range)  0.9 %  sodium chloride infusion (has no administration in time range)  sodium chloride 0.9 % bolus 1,000 mL (1,000 mLs Intravenous  New Bag/Given 09/24/19 1344)    ED Course  I have reviewed the triage vital signs and the nursing notes.  Pertinent labs & imaging results that were available during my care of the patient were reviewed by me and considered in my medical decision making (see chart for details). CRITICAL CARE Performed by: Milton Ferguson Total critical care time:40 minutes Critical care time was exclusive of separately billable procedures and treating other patients. Critical care was necessary to treat or prevent imminent or life-threatening deterioration. Critical care was time spent personally by me on the following activities: development of treatment plan with patient and/or surrogate as well as nursing, discussions with consultants, evaluation of patient's response to treatment, examination of patient, obtaining history from patient or surrogate, ordering and performing treatments and interventions, ordering and review of laboratory studies, ordering and review of radiographic studies, pulse oximetry and re-evaluation of patient's condition.    MDM Rules/Calculators/A&P                      Patient with anemia and heme positive stools.  I spoke with oncology and we will admit the patient and give him 1 unit of packed red blood cells and evaluate him for his GI bleed Final Clinical Impression(s) / ED Diagnoses Final diagnoses:  None    This patient presents to the ED for concern of weakness, this involves an extensive number of treatment options, and is a complaint that carries with it a high risk of complications and morbidity.  The differential diagnosis includes dehydration anemia sepsis   Lab Tests:   I Ordered, reviewed, and interpreted labs, which included CBC and chemistries.  CBC shows patient anemic had a hemoglobin of 8  Medicines ordered:   I ordered medication IV fluids and blood.  For dehydration and anemia  Imaging Studies ordered:   I ordered imaging studies which included  chest x-ray and  I independently visualized and interpreted imaging which showed lung mass  Additional history obtained:   Additional history obtained from records  Previous records obtained and reviewed  Consultations Obtained:   I consulted oncology and discussed lab and imaging findings.   Oncology stated if the patient is having GI bleed which  is what the heme positive stool showed that he should be admitted to medicine and worked up for the anemia  Reevaluation:  After the interventions stated above, I reevaluated the patient and found patient improved with fluids.  Critical Interventions:  .   Rx / DC Orders ED Discharge Orders    None       Milton Ferguson, MD 09/24/19 1531

## 2019-09-24 NOTE — ED Triage Notes (Addendum)
Pt BIB EMS from home. Pt has hx of lung cancer, feeding tube removed 3 days ago. Daughter reports cleaning the site, and pt began drooling. Daughter denies LOC for patient, but was not responding normally for her. EMS reports pt is alert and oriented, but pt states "does not feel good for a week". Pt does not have specific complaints, c/o generalized weakness.   BP 104/64 107 HR 97.5 temp 22 resp CBG 136 98% RA

## 2019-09-25 ENCOUNTER — Observation Stay (HOSPITAL_COMMUNITY): Payer: Medicare HMO

## 2019-09-25 DIAGNOSIS — E43 Unspecified severe protein-calorie malnutrition: Secondary | ICD-10-CM | POA: Diagnosis not present

## 2019-09-25 DIAGNOSIS — I959 Hypotension, unspecified: Secondary | ICD-10-CM | POA: Diagnosis not present

## 2019-09-25 DIAGNOSIS — Z7901 Long term (current) use of anticoagulants: Secondary | ICD-10-CM | POA: Diagnosis not present

## 2019-09-25 DIAGNOSIS — Z86711 Personal history of pulmonary embolism: Secondary | ICD-10-CM | POA: Diagnosis not present

## 2019-09-25 DIAGNOSIS — Z801 Family history of malignant neoplasm of trachea, bronchus and lung: Secondary | ICD-10-CM | POA: Diagnosis not present

## 2019-09-25 DIAGNOSIS — E860 Dehydration: Secondary | ICD-10-CM | POA: Diagnosis not present

## 2019-09-25 DIAGNOSIS — R531 Weakness: Secondary | ICD-10-CM | POA: Diagnosis not present

## 2019-09-25 DIAGNOSIS — C3491 Malignant neoplasm of unspecified part of right bronchus or lung: Secondary | ICD-10-CM | POA: Diagnosis not present

## 2019-09-25 DIAGNOSIS — K5900 Constipation, unspecified: Secondary | ICD-10-CM | POA: Diagnosis not present

## 2019-09-25 DIAGNOSIS — D649 Anemia, unspecified: Secondary | ICD-10-CM | POA: Diagnosis present

## 2019-09-25 DIAGNOSIS — E7800 Pure hypercholesterolemia, unspecified: Secondary | ICD-10-CM | POA: Diagnosis not present

## 2019-09-25 DIAGNOSIS — Z79899 Other long term (current) drug therapy: Secondary | ICD-10-CM | POA: Diagnosis not present

## 2019-09-25 DIAGNOSIS — Z20822 Contact with and (suspected) exposure to covid-19: Secondary | ICD-10-CM | POA: Diagnosis not present

## 2019-09-25 DIAGNOSIS — F0280 Dementia in other diseases classified elsewhere without behavioral disturbance: Secondary | ICD-10-CM | POA: Diagnosis present

## 2019-09-25 DIAGNOSIS — R195 Other fecal abnormalities: Secondary | ICD-10-CM

## 2019-09-25 DIAGNOSIS — Z681 Body mass index (BMI) 19 or less, adult: Secondary | ICD-10-CM | POA: Diagnosis not present

## 2019-09-25 DIAGNOSIS — D500 Iron deficiency anemia secondary to blood loss (chronic): Secondary | ICD-10-CM | POA: Diagnosis not present

## 2019-09-25 DIAGNOSIS — Z818 Family history of other mental and behavioral disorders: Secondary | ICD-10-CM | POA: Diagnosis not present

## 2019-09-25 DIAGNOSIS — I480 Paroxysmal atrial fibrillation: Secondary | ICD-10-CM | POA: Diagnosis present

## 2019-09-25 DIAGNOSIS — E861 Hypovolemia: Secondary | ICD-10-CM | POA: Diagnosis not present

## 2019-09-25 DIAGNOSIS — C3490 Malignant neoplasm of unspecified part of unspecified bronchus or lung: Secondary | ICD-10-CM | POA: Diagnosis not present

## 2019-09-25 DIAGNOSIS — G309 Alzheimer's disease, unspecified: Secondary | ICD-10-CM | POA: Diagnosis not present

## 2019-09-25 DIAGNOSIS — R627 Adult failure to thrive: Secondary | ICD-10-CM | POA: Diagnosis present

## 2019-09-25 DIAGNOSIS — I10 Essential (primary) hypertension: Secondary | ICD-10-CM | POA: Diagnosis not present

## 2019-09-25 DIAGNOSIS — E785 Hyperlipidemia, unspecified: Secondary | ICD-10-CM | POA: Diagnosis not present

## 2019-09-25 LAB — CBC
HCT: 27.2 % — ABNORMAL LOW (ref 39.0–52.0)
Hemoglobin: 8.8 g/dL — ABNORMAL LOW (ref 13.0–17.0)
MCH: 22.7 pg — ABNORMAL LOW (ref 26.0–34.0)
MCHC: 32.4 g/dL (ref 30.0–36.0)
MCV: 70.3 fL — ABNORMAL LOW (ref 80.0–100.0)
Platelets: 331 10*3/uL (ref 150–400)
RBC: 3.87 MIL/uL — ABNORMAL LOW (ref 4.22–5.81)
RDW: 17.4 % — ABNORMAL HIGH (ref 11.5–15.5)
WBC: 5.5 10*3/uL (ref 4.0–10.5)
nRBC: 0.4 % — ABNORMAL HIGH (ref 0.0–0.2)

## 2019-09-25 LAB — BASIC METABOLIC PANEL
Anion gap: 5 (ref 5–15)
BUN: 13 mg/dL (ref 8–23)
CO2: 25 mmol/L (ref 22–32)
Calcium: 9 mg/dL (ref 8.9–10.3)
Chloride: 109 mmol/L (ref 98–111)
Creatinine, Ser: 0.61 mg/dL (ref 0.61–1.24)
GFR calc Af Amer: >60 mL/min (ref >60)
GFR calc non Af Amer: >60 mL/min (ref >60)
Glucose, Bld: 83 mg/dL (ref 70–99)
Potassium: 3.9 mmol/L (ref 3.5–5.1)
Sodium: 139 mmol/L (ref 135–145)

## 2019-09-25 LAB — RETICULOCYTES
Immature Retic Fract: 19 % — ABNORMAL HIGH (ref 2.3–15.9)
RBC.: 4.03 MIL/uL — ABNORMAL LOW (ref 4.22–5.81)
Retic Count, Absolute: 15.4 10*3/uL — ABNORMAL LOW (ref 19.0–186.0)
Retic Ct Pct: 1.9 % (ref 0.4–3.1)

## 2019-09-25 LAB — IRON AND TIBC
Iron: 172 ug/dL (ref 45–182)
Saturation Ratios: 36 % (ref 17.9–39.5)
TIBC: 473 ug/dL — ABNORMAL HIGH (ref 250–450)
UIBC: 301 ug/dL

## 2019-09-25 LAB — FOLATE: Folate: 37.5 ng/mL (ref >5.9)

## 2019-09-25 LAB — GLUCOSE, CAPILLARY: Glucose-Capillary: 90 mg/dL (ref 70–99)

## 2019-09-25 LAB — VITAMIN B12: Vitamin B-12: 483 pg/mL (ref 180–914)

## 2019-09-25 LAB — FERRITIN: Ferritin: 13 ng/mL — ABNORMAL LOW (ref 24–336)

## 2019-09-25 MED ORDER — POLYETHYLENE GLYCOL 3350 17 G PO PACK
17.0000 g | PACK | Freq: Once | ORAL | Status: AC
  Administered 2019-09-25: 17 g via ORAL
  Filled 2019-09-25: qty 1

## 2019-09-25 MED ORDER — PANTOPRAZOLE SODIUM 40 MG PO TBEC
40.0000 mg | DELAYED_RELEASE_TABLET | Freq: Every day | ORAL | Status: DC
  Administered 2019-09-25 – 2019-09-27 (×3): 40 mg via ORAL
  Filled 2019-09-25 (×3): qty 1

## 2019-09-25 MED ORDER — DOCUSATE SODIUM 100 MG PO CAPS
100.0000 mg | ORAL_CAPSULE | Freq: Two times a day (BID) | ORAL | Status: DC
  Administered 2019-09-25 – 2019-09-26 (×3): 100 mg via ORAL
  Filled 2019-09-25 (×4): qty 1

## 2019-09-25 MED ORDER — RIVAROXABAN 20 MG PO TABS
20.0000 mg | ORAL_TABLET | Freq: Every day | ORAL | Status: DC
  Administered 2019-09-25 – 2019-09-26 (×2): 20 mg via ORAL
  Filled 2019-09-25 (×2): qty 1

## 2019-09-25 NOTE — Evaluation (Signed)
Occupational Therapy Evaluation Patient Details Name: Richard [name] MRN: [id number] DOB: 04-Sep-1940 Today's Date: 09/25/2019    History of Present Illness Richard [name] is a 79 y.o. male with medical history significant of stage IIIa non-small cell lung cancer, hypertension, hyperlipidemia, dementia who presents with worsening fatigue and weakness, noted to have new drop in Hgb to 8.0 and presentation concerning for symptomatic anemia.   Clinical Impression   Pt admitted with the above diagnoses and presents with below problem list. Pt will benefit from continued acute OT to address the below listed deficits and maximize independence with basic ADLs prior to d/c to venue below. PTA pt reports he needed no physical A with basic ADLs, pt is not a reliable historian and no family present to confirm however per chart review it sounds like he was at a least supervision level for OOB ADLs. Pt currently min guard to min A with LB ADLs and functional mobility. Pt fatigued after walking bathroom distance, needed to have recliner brought to him to sit. Resting comfortable in recliner at end of session with chair alarm on.      Follow Up Recommendations  Home health OT;Supervision/Assistance - 24 hour    Equipment Recommendations  3 in 1 bedside commode    Recommendations for Other Services       Precautions / Restrictions Precautions Precautions: Fall Restrictions Weight Bearing Restrictions: No      Mobility Bed Mobility Overal bed mobility: Modified Independent;Needs Assistance Bed Mobility: Supine to Sit     Supine to sit: Supervision     General bed mobility comments: S for safety  Transfers Overall transfer level: Needs assistance   Transfers: Sit to/from Stand;Stand Pivot Transfers Sit to Stand: Min guard Stand pivot transfers: Min assist       General transfer comment: Pt stood with 1 shoe on and 1 shoe off to perform SPT and required increased A for safety    Balance  Overall balance assessment: Needs assistance Sitting-balance support: No upper extremity supported;Feet supported Sitting balance-Leahy Scale: Fair Sitting balance - Comments: able to sit EOB to put shoes on. impulsive.      Standing balance-Leahy Scale: Poor                             ADL either performed or assessed with clinical judgement   ADL Overall ADL's : Needs assistance/impaired Eating/Feeding: Set up;Sitting   Grooming: Set up;Sitting;Wash/dry face   Upper Body Bathing: Set up;Sitting   Lower Body Bathing: Min guard;Sit to/from stand;Minimal assistance   Upper Body Dressing : Set up;Sitting   Lower Body Dressing: Min guard;Minimal assistance;Sit to/from stand   Toilet Transfer: Minimal assistance;Ambulation;RW;Comfort height toilet;Grab bars   Toileting- Clothing Manipulation and Hygiene: Min guard;Minimal assistance;Sit to/from stand   Tub/ Shower Transfer: Minimal assistance;Ambulation;Rolling walker   Functional mobility during ADLs: Minimal assistance;Rolling walker General ADL Comments: Pt completed grooming task, sat EOB briefly then pivoted to recliner to finish putting shoes on. Pt then completed household distance functional mobility. Intermittent min A during OOB tasks due to balance and decreased safety awareness.     Vision         Perception     Praxis      Pertinent Vitals/Pain Pain Assessment: No/denies pain     Hand Dominance     Extremity/Trunk Assessment Upper Extremity Assessment Upper Extremity Assessment: Generalized weakness   Lower Extremity Assessment Lower Extremity Assessment: Defer to PT evaluation  Communication Communication Communication: No difficulties   Cognition Arousal/Alertness: Awake/alert Behavior During Therapy: WFL for tasks assessed/performed;Impulsive Overall Cognitive Status: History of cognitive impairments - at baseline                                 General  Comments: Pt fluctuated between slightly agitated to cooperative, but mostly pleasant, making jokes and reluctantly agreeable to work with therapy.   General Comments       Exercises     Shoulder Instructions      Home Living Family/patient expects to be discharged to:: Private residence Living Arrangements: Children Available Help at Discharge: Family Type of Home: House Home Access: Stairs to enter [employer] of Steps: 2-3   Home Layout: Two level;One level Alternate Level Stairs-Number of Steps: flight   Bathroom Shower/Tub: Occupational psychologist: Standard     Home Equipment: Environmental consultant - 2 wheels   Additional Comments: Information gained from previous admission.      Prior Functioning/Environment Level of Independence: Needs assistance  Gait / Transfers Assistance Needed: Daughter assists him as needed. ADL's / Homemaking Assistance Needed: no family present to provide PLOF data            OT Problem List: Impaired balance (sitting and/or standing);Decreased activity tolerance;Decreased knowledge of use of DME or AE;Decreased knowledge of precautions;Decreased cognition      OT Treatment/Interventions: Self-care/ADL training;Therapeutic exercise;Energy conservation;DME and/or AE instruction;Therapeutic activities;Cognitive remediation/compensation;Patient/family education;Balance training    OT Goals(Current goals can be found in the care plan section) Acute Rehab OT Goals Patient Stated Goal: Go home today OT Goal Formulation: With patient Time For Goal Achievement: 10/09/19 Potential to Achieve Goals: Good ADL Goals Pt Will Perform Grooming: with modified independence;standing Pt Will Perform Upper Body Dressing: with set-up;sitting Pt Will Perform Lower Body Dressing: sit to/from stand;with supervision Pt Will Transfer to Toilet: ambulating;with supervision Pt Will Perform Toileting - Clothing Manipulation and hygiene: sit to/from  stand;with supervision Pt Will Perform Tub/Shower Transfer: with supervision;ambulating;rolling walker  OT Frequency: Min 2X/week   Barriers to D/C:            Co-evaluation PT/OT/SLP Co-Evaluation/Treatment: Yes Reason for Co-Treatment: Necessary to address cognition/behavior during functional activity;For patient/therapist safety PT goals addressed during session: Mobility/safety with mobility;Balance;Proper use of DME OT goals addressed during session: ADL's and self-care      AM-PAC OT "6 Clicks" Daily Activity     Outcome Measure Help from another person eating meals?: None Help from another person taking care of personal grooming?: A Little Help from another person toileting, which includes using toliet, bedpan, or urinal?: A Little Help from another person bathing (including washing, rinsing, drying)?: A Little Help from another person to put on and taking off regular upper body clothing?: None Help from another person to put on and taking off regular lower body clothing?: A Little 6 Click Score: 20   End of Session Equipment Utilized During Treatment: Rolling walker Nurse Communication: Mobility status;Precautions  Activity Tolerance: Patient limited by fatigue;Patient tolerated treatment well Patient left: in chair;with call bell/phone within reach;with chair alarm set  OT Visit Diagnosis: Unsteadiness on feet (R26.81);Muscle weakness (generalized) (M62.81)                Time: 1610-9604 OT Time Calculation (min): 17 min Charges:  OT General Charges $OT Visit: 1 Visit OT Evaluation $OT Eval Low Complexity: 1 Low  Tyrone Schimke, OT  Acute Rehabilitation Services Pager: [phone] Office: [phone]   Hortencia Pilar 09/25/2019, 1:13 PM

## 2019-09-25 NOTE — Consult Note (Addendum)
[facility] Consult  Referring Provider: Dr.Chandra([facility]) Primary Care Physician:  [name], NP Primary Gastroenterologist: Althia Forts  Reason for Consultation: FOBT positive stool, anemia  HPI: Richard [name] is a 79 y.o. male with history of stage III non-small cell lung cancer, on Xarelto, on chemoradiation, had a feeding tube placement performed for malnutrition which was removed on 09/08/2019 was brought to the ED by his daughter due to lethargy.  History is obtained from her daughter over the phone as patient has dementia. She is unsure if patient has had any black stools or blood in his stool. She thinks he has not had a bowel movement in several days since the feeding tube was removed. No prior endoscopy or colonoscopy. However, patient has been complaining of abdominal pain, she believes is related to constipation and irregular bowel movements.    Past Medical History:  Diagnosis Date  . Dementia ([place])   . High cholesterol   . Hypertension   . Lung mass 12/2018    Past Surgical History:  Procedure Laterality Date  . IR GASTROSTOMY TUBE MOD SED  05/25/2019  . IR GASTROSTOMY TUBE REMOVAL  09/08/2019  . JOINT REPLACEMENT    . VIDEO BRONCHOSCOPY WITH ENDOBRONCHIAL ULTRASOUND N/A 12/16/2018   Procedure: VIDEO BRONCHOSCOPY WITH ENDOBRONCHIAL ULTRASOUND AND FLUROSCOPY;  Surgeon: [name], MD;  Location: [place];  Service: Pulmonary;  Laterality: N/A;    Prior to Admission medications   Medication Sig Start Date End Date Taking? Authorizing Provider  ferrous sulfate 325 (65 FE) MG tablet Take 325 mg by mouth 2 (two) times daily with a meal.   Yes [provider]  Multiple Vitamin (MULTIVITAMIN) tablet Take 1 tablet by mouth daily.   Yes [provider]  XARELTO 20 MG TABS tablet TAKE 1 TABLET BY MOUTH DAILY WITH SUPPER Patient taking differently: Take 20 mg by mouth daily with supper.  09/12/19  Yes Curt Bears, MD   Amino Acids-Protein Hydrolys (FEEDING SUPPLEMENT, PRO-STAT SUGAR FREE 64,) LIQD Place 30 mLs into feeding tube daily. Patient not taking: Reported on 09/24/2019 06/14/19   [name], NP  Nutritional Supplements (FEEDING SUPPLEMENT, OSMOLITE 1.5 CAL,) LIQD Place 237 mLs into feeding tube 4 (four) times daily. Patient not taking: Reported on 09/24/2019 05/30/19   Alma Friendly, MD  Water For Irrigation, Sterile (FREE WATER) SOLN Place 100 mLs into feeding tube every 6 (six) hours. Patient not taking: Reported on 09/24/2019 05/30/19   Alma Friendly, MD    Current Facility-Administered Medications  Medication Dose Route Frequency Provider Last Rate Last Admin  . acetaminophen (TYLENOL) tablet 650 mg  650 mg Oral Q6H PRN Truddie Hidden, MD       Or  . acetaminophen (TYLENOL) suppository 650 mg  650 mg Rectal Q6H PRN Truddie Hidden, MD      . feeding supplement (ENSURE ENLIVE) (ENSURE ENLIVE) liquid 237 mL  237 mL Oral TID BM Truddie Hidden, MD   237 mL at 09/25/19 0828  . ferrous sulfate tablet 325 mg  325 mg Oral BID WC Truddie Hidden, MD   325 mg at 09/25/19 9021  . multivitamin with minerals tablet 1 tablet  1 tablet Oral Daily Truddie Hidden, MD   1 tablet at 09/25/19 [phone]  . ondansetron (ZOFRAN) tablet 4 mg  4 mg Oral Q6H PRN Truddie Hidden, MD       Or  . ondansetron [facility]) injection 4 mg  4 mg Intravenous Q6H PRN Truddie Hidden, MD  Allergies as of 09/24/2019  . (No Known Allergies)    Family History  Problem Relation Age of Onset  . Stomach cancer Mother   . COPD Father   . Lung cancer Brother   . Lung cancer Brother     Social History   Socioeconomic History  . Marital status: Divorced    Spouse name: Not on file  . Number of children: Not on file  . Years of education: Not on file  . Highest education level: Not on file  Occupational History  . Not on file  Tobacco Use  . Smoking status: Former Smoker    Packs/day: 1.00     Years: 60.00    Pack years: 60.00    Types: Cigarettes  . Smokeless tobacco: Never Used  Substance and Sexual Activity  . Alcohol use: Yes    Comment: occasional  . Drug use: No  . Sexual activity: Not on file  Other Topics Concern  . Not on file  Social History Narrative  . Not on file   Social Determinants of Health   Financial Resource Strain:   . Difficulty of Paying Living Expenses:   Food Insecurity:   . Worried About Charity fundraiser in the Last Year:   . Arboriculturist in the Last Year:   Transportation Needs:   . Film/video editor (Medical):   Marland Kitchen Lack of Transportation (Non-Medical):   Physical Activity:   . Days of Exercise per Week:   . Minutes of Exercise per Session:   Stress:   . Feeling of Stress :   Social Connections:   . Frequency of Communication with Friends and Family:   . Frequency of Social Gatherings with Friends and Family:   . Attends Religious Services:   . Active Member of Clubs or Organizations:   . Attends Archivist Meetings:   Marland Kitchen Marital Status:   Intimate Partner Violence:   . Fear of Current or Ex-Partner:   . Emotionally Abused:   Marland Kitchen Physically Abused:   . Sexually Abused:     Review of Systems:  Unable to obtain because of patient's dementia Daughter is concerned about urinary infection and pain over site of feeding tube removal   Physical Exam: Vital signs in last 24 hours: Temp:  [97.5 F (36.4 C)-98.4 F (36.9 C)] 98.4 F (36.9 C) (04/11 0604) Pulse Rate:  [80-94] 86 (04/11 0604) Resp:  [18-25] 19 (04/11 0604) BP: (107-123)/(57-74) 108/62 (04/11 0604) SpO2:  [95 %-100 %] 98 % (04/11 0604) Last BM Date: 09/23/19  General:   Alert,  Well-developed, well-nourished, pleasant with dementia Head:  Normocephalic and atraumatic. Eyes:  Sclera clear, no icterus.   Mild pallor Ears:  Normal auditory acuity. Nose:  No deformity, discharge,  or lesions. Mouth:  No deformity or lesions.  Oropharynx pink &  moist. Neck:  Supple; no masses or thyromegaly. Lungs:  Clear throughout to auscultation.   No wheezes, crackles, or rhonchi. No acute distress. Heart:  Regular rate and rhythm; no murmurs, clicks, rubs,  or gallops. Extremities:  Without clubbing or edema. Neurologic:  Alert and  oriented x4;  grossly normal neurologically. Skin:  Intact without significant lesions or rashes. Psych:  Alert and cooperative. Normal mood and affect. Abdomen:  Soft, dressing present at previous feeding tube site with mild tenderness, no masses, hepatosplenomegaly or hernias noted. Normal bowel sounds, without guarding, and without rebound.         Lab Results: Recent Labs  09/24/19 1310 09/25/19 0621  WBC 6.7 5.5  HGB 8.0* 8.8*  HCT 25.9* 27.2*  PLT 402* 331   BMET Recent Labs    09/24/19 1310 09/25/19 0621  NA 140 139  K 4.1 3.9  CL 106 109  CO2 28 25  GLUCOSE 155* 83  BUN 15 13  CREATININE 0.74 0.61  CALCIUM 9.2 9.0   LFT Recent Labs    09/24/19 1310  PROT 7.1  ALBUMIN 3.4*  AST 18  ALT 12  ALKPHOS 82  BILITOT 0.4   PT/INR No results for input(s): LABPROT, INR in the last 72 hours.  Studies/Results: DG Chest Port 1 View  Result Date: 09/24/2019 CLINICAL DATA:  History of lung cancer. Change in mental status. EXAM: PORTABLE CHEST 1 VIEW COMPARISON:  Chest x-ray May 23, 2019. CT scan August 31, 2019. FINDINGS: Opacity in the right base is mildly worsened compared to May 23, 2019. The heart, hila, and mediastinum are stable. No other pulmonary infiltrates. No pneumothorax. No other acute abnormalities. IMPRESSION: 1. Opacity in the right base is mildly more prominent compared to May 23, 2019. While some of these changes are consistent with the patient's known malignancy and treatment, a superimposed infiltrate such as pneumonia or aspiration is not excluded. Electronically Signed   By: Dorise Bullion III M.D   On: 09/24/2019 12:22    Impression: Anemia, FOBT  positive stool, no obvious melena or hematochezia reported History of constipation  History of lung cancer Was on Xarelto  Plan: I had a detailed talk with the patient's daughter over the phone. Patient has never had an endoscopy or colonoscopy in the past, and will not be a candidate for colonoscopy as he will likely not be able to drink/finish the colonic prep.  Also with his age and comorbidy-lung cancer, he will not be a good candidate for anesthesia.  Recommend conservative management, PPI daily, monitor H&H and transfuse if needed. Will get an abdominal x-ray as patient's daughter concerned about constipation Okay to resume Xarelto   LOS: 0 days   Ronnette Juniper, MD  09/25/2019, 11:14 AM

## 2019-09-25 NOTE — Evaluation (Signed)
Physical Therapy Evaluation Patient Details Name: Richard [name] MRN: [id number] DOB: 03-25-1941 Today's Date: 09/25/2019   History of Present Illness  Richard [name] is a 79 y.o. male with medical history significant of stage IIIa non-small cell lung cancer, hypertension, hyperlipidemia, dementia who presents with worsening fatigue and weakness, noted to have new drop in Hgb to 8.0 and presentation concerning for symptomatic anemia.    Clinical Impression  Pt admitted with above diagnosis.  Pt currently with functional limitations due to the deficits listed below (see PT Problem List). Pt will benefit from skilled PT to increase their independence and safety with mobility to allow discharge to the venue listed below.  Pt with decreased safety awareness.  Pt and daughter known to PT from previous admission and pt's daughter helps pt as needed.  Pt was agreeable to PT today, but has hx of refusals.  Recommend HHPT for home safety assessment if pt/family are agreeable to it.     Follow Up Recommendations Home health PT(if pt/family agreeable)    Equipment Recommendations  None recommended by PT    Recommendations for Other Services       Precautions / Restrictions Precautions Precautions: Fall Restrictions Weight Bearing Restrictions: No      Mobility  Bed Mobility Overal bed mobility: Modified Independent;Needs Assistance Bed Mobility: Supine to Sit     Supine to sit: Supervision     General bed mobility comments: S for safety  Transfers Overall transfer level: Needs assistance   Transfers: Sit to/from Stand;Stand Pivot Transfers Sit to Stand: Min guard Stand pivot transfers: Min assist       General transfer comment: Pt stood with 1 shoe on and 1 shoe off to perform SPT and required increased A for safety  Ambulation/Gait Ambulation/Gait assistance: Min guard;Min assist Gait Distance (Feet): 30 Feet Assistive device: Rolling walker (2 wheeled) Gait Pattern/deviations:  Decreased step length - right;Decreased step length - left;Trunk flexed     General Gait Details: Amb with RW and min/guard to MIN A for safety especially with turns due to decreased safety.  Stairs            Wheelchair Mobility    Modified Rankin (Stroke Patients Only)       Balance Overall balance assessment: Needs assistance           Standing balance-Leahy Scale: Poor                               Pertinent Vitals/Pain      Home Living Family/patient expects to be discharged to:: Private residence Living Arrangements: Children Available Help at Discharge: Family Type of Home: House Home Access: Stairs to enter   [employer] of Steps: 2-3 Home Layout: Two level;One level Home Equipment: Walker - 2 wheels Additional Comments: Information gained from previous admission.    Prior Function Level of Independence: Needs assistance   Gait / Transfers Assistance Needed: Daughter assists him as needed.           Hand Dominance        Extremity/Trunk Assessment   Upper Extremity Assessment Upper Extremity Assessment: Defer to OT evaluation    Lower Extremity Assessment Lower Extremity Assessment: Generalized weakness       Communication      Cognition Arousal/Alertness: Awake/alert   Overall Cognitive Status: History of cognitive impairments - at baseline  General Comments: Pt fluctuated between slightly agitated to cooperative, but mostly pleasant, making jokes and reluctantly agreeable to work with therapy.      General Comments      Exercises     Assessment/Plan    PT Assessment Patient needs continued PT services  PT Problem List Decreased strength;Decreased activity tolerance;Decreased balance;Decreased mobility;Decreased safety awareness       PT Treatment Interventions DME instruction;Gait training;Stair training;Functional mobility training;Therapeutic  activities;Therapeutic exercise;Balance training    PT Goals (Current goals can be found in the Care Plan section)  Acute Rehab PT Goals Patient Stated Goal: Go home today PT Goal Formulation: With patient Time For Goal Achievement: 10/09/19 Potential to Achieve Goals: Good    Frequency Min 3X/week   Barriers to discharge        Co-evaluation PT/OT/SLP Co-Evaluation/Treatment: Yes Reason for Co-Treatment: For patient/therapist safety;Necessary to address cognition/behavior during functional activity PT goals addressed during session: Mobility/safety with mobility;Balance;Proper use of DME         AM-PAC PT "6 Clicks" Mobility  Outcome Measure Help needed turning from your back to your side while in a flat bed without using bedrails?: None Help needed moving from lying on your back to sitting on the side of a flat bed without using bedrails?: None Help needed moving to and from a bed to a chair (including a wheelchair)?: A Little Help needed standing up from a chair using your arms (e.g., wheelchair or bedside chair)?: A Little Help needed to walk in hospital room?: A Little Help needed climbing 3-5 steps with a railing? : A Little 6 Click Score: 20    End of Session   Activity Tolerance: Patient tolerated treatment well Patient left: in chair;with call bell/phone within reach;with chair alarm set;with nursing/sitter in room Nurse Communication: Mobility status PT Visit Diagnosis: Unsteadiness on feet (R26.81);Muscle weakness (generalized) (M62.81)    Time: 6754-4920 PT Time Calculation (min) (ACUTE ONLY): 17 min   Charges:   PT Evaluation $PT Eval Low Complexity: 1 Low          Richard [name], [place] Pager [phone] 09/25/2019   Richard [name] 09/25/2019, 11:00 AM

## 2019-09-25 NOTE — Progress Notes (Signed)
[facility]                                                                              Patient Demographics  Richard [name], is a 79 y.o. male, DOB - 02/09/1941, PZW:[id number]  Admit date - 09/24/2019   Admitting Physician Truddie Hidden, MD  Outpatient Primary MD for the patient is [name], NP  Outpatient specialists:   LOS - 0  days   Medical records reviewed and are as summarized below:    Chief Complaint  Patient presents with   Generalized Weakness       Brief summary   Patient is a 79 year old male with history of stage IIIa non-small cell lung CA, hypertension, hyperlipidemia, dementia presented with worsening fatigue.  Patient was noticed to be more lethargic, less active for a week PTA.  Per daughter, initially attributed to the feeding tube being removed, and was not drinking as much water.  Patient was noted to be more fatigued had not had a BM in several days. Hemoglobin was noted to be 8.0, was 11.9 on 08/31/2019  patient was admitted for possible GI bleed and symptomatic anemia  Assessment & Plan    Symptomatic anemia -Possible subacute blood loss anemia, FOBT positive -GI consulted, seen by Dr. Therisa Doyne, recommended conservative management, PPI daily, monitor H&H and transfuse if needed -Obtain anemia panel -No plans for endoscopy, okay to resume Xarelto, follow H&H -Transfuse 1 unit packed RBC in ED, hemoglobin 8.8  Constipation -Abdominal x-ray negative for any obstruction -Placed on Colace and MiraLAX  Paroxysmal atrial fibrillation Rate controlled, not on beta-blocker Seen by gastroenterology, okay to resume Xarelto  History of NSCLC stage IIIa Follows Dr. Julien Nordmann, no acute issues  Alzheimer's dementia Currently pleasant and cooperative, no acute issues, delirium precautions  History of PE Resume Xarelto  Severe protein calorie malnutrition Nutrition consult Estimated body mass index is 19.1 kg/m as calculated  from the following:   Height as of 09/09/19: 6\' 2"  (1.88 m).   Weight as of 09/09/19: 67.5 kg.  Code Status: Full code DVT Prophylaxis: Resume Xarelto Family Communication: Discussed all imaging results, lab results, explained to the patient's daughter on the phone  Disposition Plan: Patient from home, anticipate discharge home in am if no acute issues overnight, H&H remained stable, resuming Xarelto  Time Spent in minutes  1mins   Procedures:  None   Consultants:   GI   Antimicrobials:   Anti-infectives (From admission, onward)   None          Medications  Scheduled Meds:  feeding supplement (ENSURE ENLIVE)  237 mL Oral TID BM   ferrous sulfate  325 mg Oral BID WC   multivitamin with minerals  1 tablet Oral Daily   pantoprazole  40 mg Oral Daily   Continuous Infusions: PRN Meds:.acetaminophen **OR** acetaminophen, ondansetron **OR** ondansetron (ZOFRAN) IV      Subjective:   Cyler Bouwens was seen and examined today.  *Pleasant and cooperative, denies any hematemesis or any active bleeding.  No dizziness. Patient denies dizziness, chest pain, shortness of breath, N/V/D/C, new weakness, numbess, tingling. No acute events overnight.  Objective:   Vitals:   09/24/19 2043 09/24/19 2117 09/25/19 0000 09/25/19 0604  BP: 123/74 118/66 119/66 108/62  Pulse: 90 86 91 86  Resp: 20 20 20 19   Temp: 97.7 F (36.5 C) 97.8 F (36.6 C) 97.8 F (36.6 C) 98.4 F (36.9 C)  TempSrc: Oral Oral Oral Oral  SpO2: 100% 99% 98% 98%    Intake/Output Summary (Last 24 hours) at 09/25/2019 1218 Last data filed at 09/25/2019 1016 Gross per 24 hour  Intake 1167 ml  Output 400 ml  Net 767 ml     Wt Readings from Last 3 Encounters:  09/09/19 67.5 kg  09/05/19 67 kg  05/19/19 60.9 kg     Exam  General: Alert and oriented to self, dementia  Cardiovascular: S1 S2 auscultated, no murmurs, RRR  Respiratory: Clear to auscultation bilaterally, no wheezing, rales or  rhonchi  Gastrointestinal: Soft, mild TTP at the previous feeding tube site, nondistended, NBS  Ext: no pedal edema bilaterally  Neuro: Moving all 4 extremities  Musculoskeletal: No digital cyanosis, clubbing  Skin: No rashes  Psych: Dementia   Data Reviewed:  I have personally reviewed following labs and imaging studies  Micro Results Recent Results (from the past 240 hour(s))  Respiratory Panel by RT PCR (Flu A&B, Covid) - Nasopharyngeal Swab     Status: None   Collection Time: 09/24/19  1:10 PM   Specimen: Nasopharyngeal Swab  Result Value Ref Range Status   SARS Coronavirus 2 by RT PCR NEGATIVE NEGATIVE Final    Comment: (NOTE) SARS-CoV-2 target nucleic acids are NOT DETECTED. The SARS-CoV-2 RNA is generally detectable in upper respiratoy specimens during the acute phase of infection. The lowest concentration of SARS-CoV-2 viral copies this assay can detect is 131 copies/mL. A negative result does not preclude SARS-Cov-2 infection and should not be used as the sole basis for treatment or other patient management decisions. A negative result may occur with  improper specimen collection/handling, submission of specimen other than nasopharyngeal swab, presence of viral mutation(s) within the areas targeted by this assay, and inadequate number of viral copies (<131 copies/mL). A negative result must be combined with clinical observations, patient history, and epidemiological information. The expected result is Negative. Fact Sheet for Patients:  PinkCheek.be Fact Sheet for Healthcare Providers:  GravelBags.it This test is not yet ap proved or cleared by the [place] FDA and  has been authorized for detection and/or diagnosis of SARS-CoV-2 by FDA under an Emergency Use Authorization (EUA). This EUA will remain  in effect (meaning this test can be used) for the duration of the COVID-19 declaration under Section  564(b)(1) of the Act, 21 U.S.C. section 360bbb-3(b)(1), unless the authorization is terminated or revoked sooner.    Influenza A by PCR NEGATIVE NEGATIVE Final   Influenza B by PCR NEGATIVE NEGATIVE Final    Comment: (NOTE) The Xpert Xpress SARS-CoV-2/FLU/RSV assay is intended as an aid in  the diagnosis of influenza from Nasopharyngeal swab specimens and  should not be used as a sole basis for treatment. Nasal washings and  aspirates are unacceptable for Xpert Xpress SARS-CoV-2/FLU/RSV  testing. Fact Sheet for Patients: PinkCheek.be Fact Sheet for Healthcare Providers: GravelBags.it This test is not yet approved or cleared by the [place] FDA and  has been authorized for detection and/or diagnosis of SARS-CoV-2 by  FDA under an Emergency Use Authorization (EUA). This EUA will remain  in effect (meaning this test can be used) for the duration of the  Covid-19 declaration  under Section 564(b)(1) of the Act, 21  U.S.C. section 360bbb-3(b)(1), unless the authorization is  terminated or revoked. Performed at [facility], [place] [address] [place], [place] [address]     Radiology Reports CT Chest W Contrast  Result Date: 08/31/2019 CLINICAL DATA:  Restaging lung cancer. EXAM: CT CHEST WITH CONTRAST TECHNIQUE: Multidetector CT imaging of the chest was performed during intravenous contrast administration. CONTRAST:  73mL OMNIPAQUE IOHEXOL 300 MG/ML  SOLN COMPARISON:  05/26/2019. FINDINGS: Cardiovascular: Atherosclerotic calcification of the aorta and coronary arteries. Heart size normal. No pericardial effusion. Mediastinum/Nodes: No pathologically enlarged mediastinal, left hilar or axillary lymph nodes. There is soft tissue thickening in the right hilum, similar to the prior exam. Esophagus is grossly unremarkable. Lungs/Pleura: Centrilobular emphysema. Decrease in size of an area of cavitation in the central  right middle lobe (8/89). Measurement is difficult with persistent right middle lobe collapse. Increasing ground-glass in the inferior right upper lobe and right lower lobe which obscures a previously measured nodule in the inferior right upper lobe. A separate nodule in the posterior segment right upper lobe on the prior study is no longer visualized. New small right pleural effusion. 5 mm peripheral left lower lobe nodule (8/112), stable. Mild chronic appearing volume loss in the posteromedial left lower lobe. No left pleural fluid. Airway is unremarkable. Upper Abdomen: Visualized portions of the liver, gallbladder, adrenal glands, kidneys, spleen, pancreas, stomach and bowel are grossly unremarkable. No upper abdominal adenopathy. Musculoskeletal: Degenerative changes in the spine. No worrisome lytic or sclerotic lesions. IMPRESSION: 1. Continued regression of a cavitary lesion in the perihilar right middle lobe. Persistent associated right middle lobe collapse. Expected changes of radiation therapy in the right upper and right lower lobes. Previously measured right upper lobe nodules have resolved or are obscured. 2. Small right pleural effusion, new. 3. 5 mm left lower lobe nodule, stable. 4.  Aortic atherosclerosis (ICD10-I70.0). 5.  Emphysema (ICD10-J43.9). Electronically Signed   By: Lorin Picket M.D.   On: 08/31/2019 14:12   DG Chest Port 1 View  Result Date: 09/24/2019 CLINICAL DATA:  History of lung cancer. Change in mental status. EXAM: PORTABLE CHEST 1 VIEW COMPARISON:  Chest x-ray May 23, 2019. CT scan August 31, 2019. FINDINGS: Opacity in the right base is mildly worsened compared to May 23, 2019. The heart, hila, and mediastinum are stable. No other pulmonary infiltrates. No pneumothorax. No other acute abnormalities. IMPRESSION: 1. Opacity in the right base is mildly more prominent compared to May 23, 2019. While some of these changes are consistent with the patient's known  malignancy and treatment, a superimposed infiltrate such as pneumonia or aspiration is not excluded. Electronically Signed   By: Dorise Bullion III M.D   On: 09/24/2019 12:22   IR GASTROSTOMY TUBE REMOVAL  Result Date: 09/08/2019 INDICATION: Patient with history of malnutrition s/p percutaneous pull-through 20 French gastrostomy tube placement in IR 05/25/2019 by Dr. Anselm Pancoast. Patient now tolerating p.o. intake and gaining weight. Request is made for gastrostomy tube removal. EXAM: IR GASTROSTOMY TUBE REMOVAL MEDICATIONS: 15 cc viscous lidocaine ANESTHESIA/SEDATION: None CONTRAST:  None FLUOROSCOPY TIME:  None COMPLICATIONS: None immediate. PROCEDURE: Informed written consent was obtained from the patient after a thorough discussion of the procedural risks, benefits and alternatives. All questions were addressed. Maximal Sterile Barrier Technique was utilized including mask, sterile gloves, hand hygiene and skin antiseptic. A timeout was performed prior to the initiation of the procedure. 15 cc viscous lidocaine was placed down gastrostomy tube track 5-10  minutes prior to removal. Using traction, gastrostomy tube was removed intact. Dressing (gauze and tape) was placed over site. IMPRESSION: Successful removal of percutaneous pull-through 20 French gastrostomy tube. Read by: Earley Abide, PA-C Electronically Signed   By: Sandi Mariscal M.D.   On: 09/08/2019 11:24    Lab Data:  CBC: Recent Labs  Lab 09/24/19 1310 09/25/19 0621  WBC 6.7 5.5  NEUTROABS 4.8  --   HGB 8.0* 8.8*  HCT 25.9* 27.2*  MCV 69.6* 70.3*  PLT 402* 982   Basic Metabolic Panel: Recent Labs  Lab 09/24/19 1310 09/25/19 0621  NA 140 139  K 4.1 3.9  CL 106 109  CO2 28 25  GLUCOSE 155* 83  BUN 15 13  CREATININE 0.74 0.61  CALCIUM 9.2 9.0   GFR: CrCl cannot be calculated (Unknown ideal weight.). Liver Function Tests: Recent Labs  Lab 09/24/19 1310  AST 18  ALT 12  ALKPHOS 82  BILITOT 0.4  PROT 7.1  ALBUMIN 3.4*    No results for input(s): LIPASE, AMYLASE in the last 168 hours. No results for input(s): AMMONIA in the last 168 hours. Coagulation Profile: No results for input(s): INR, PROTIME in the last 168 hours. Cardiac Enzymes: No results for input(s): CKTOTAL, CKMB, CKMBINDEX, TROPONINI in the last 168 hours. BNP (last 3 results) No results for input(s): PROBNP in the last 8760 hours. HbA1C: No results for input(s): HGBA1C in the last 72 hours. CBG: Recent Labs  Lab 09/25/19 0714  GLUCAP 90   Lipid Profile: No results for input(s): CHOL, HDL, LDLCALC, TRIG, CHOLHDL, LDLDIRECT in the last 72 hours. Thyroid Function Tests: No results for input(s): TSH, T4TOTAL, FREET4, T3FREE, THYROIDAB in the last 72 hours. Anemia Panel: No results for input(s): VITAMINB12, FOLATE, FERRITIN, TIBC, IRON, RETICCTPCT in the last 72 hours. Urine analysis:    Component Value Date/Time   COLORURINE YELLOW 09/24/2019 1310   APPEARANCEUR CLOUDY (A) 09/24/2019 1310   LABSPEC 1.013 09/24/2019 1310   PHURINE 7.0 09/24/2019 1310   GLUCOSEU NEGATIVE 09/24/2019 1310   HGBUR NEGATIVE 09/24/2019 1310   BILIRUBINUR NEGATIVE 09/24/2019 1310   KETONESUR NEGATIVE 09/24/2019 1310   PROTEINUR NEGATIVE 09/24/2019 1310   NITRITE POSITIVE (A) 09/24/2019 1310   LEUKOCYTESUR SMALL (A) 09/24/2019 1310     Bentlie Withem M.D. [facility] 09/25/2019, 12:18 PM   Call night coverage person covering after 7pm

## 2019-09-26 ENCOUNTER — Encounter (HOSPITAL_COMMUNITY): Payer: Self-pay | Admitting: Internal Medicine

## 2019-09-26 LAB — BASIC METABOLIC PANEL
Anion gap: 12 (ref 5–15)
BUN: 13 mg/dL (ref 8–23)
CO2: 24 mmol/L (ref 22–32)
Calcium: 9.3 mg/dL (ref 8.9–10.3)
Chloride: 103 mmol/L (ref 98–111)
Creatinine, Ser: 0.65 mg/dL (ref 0.61–1.24)
GFR calc Af Amer: >60 mL/min (ref >60)
GFR calc non Af Amer: >60 mL/min (ref >60)
Glucose, Bld: 95 mg/dL (ref 70–99)
Potassium: 4.2 mmol/L (ref 3.5–5.1)
Sodium: 139 mmol/L (ref 135–145)

## 2019-09-26 LAB — CBC
HCT: 28.6 % — ABNORMAL LOW (ref 39.0–52.0)
Hemoglobin: 9.1 g/dL — ABNORMAL LOW (ref 13.0–17.0)
MCH: 22 pg — ABNORMAL LOW (ref 26.0–34.0)
MCHC: 31.8 g/dL (ref 30.0–36.0)
MCV: 69.1 fL — ABNORMAL LOW (ref 80.0–100.0)
Platelets: 345 10*3/uL (ref 150–400)
RBC: 4.14 MIL/uL — ABNORMAL LOW (ref 4.22–5.81)
RDW: 17.8 % — ABNORMAL HIGH (ref 11.5–15.5)
WBC: 5.9 10*3/uL (ref 4.0–10.5)
nRBC: 0.5 % — ABNORMAL HIGH (ref 0.0–0.2)

## 2019-09-26 LAB — GLUCOSE, CAPILLARY: Glucose-Capillary: 86 mg/dL (ref 70–99)

## 2019-09-26 MED ORDER — SODIUM CHLORIDE 0.9 % IV BOLUS
500.0000 mL | Freq: Once | INTRAVENOUS | Status: AC
  Administered 2019-09-26: 500 mL via INTRAVENOUS

## 2019-09-26 MED ORDER — SODIUM CHLORIDE 0.9 % IV SOLN: INTRAVENOUS | Status: DC

## 2019-09-26 MED ORDER — POLYETHYLENE GLYCOL 3350 17 G PO PACK
17.0000 g | PACK | Freq: Two times a day (BID) | ORAL | Status: DC
  Administered 2019-09-26 – 2019-09-27 (×3): 17 g via ORAL
  Filled 2019-09-26 (×3): qty 1

## 2019-09-26 MED ORDER — BISACODYL 10 MG RE SUPP
10.0000 mg | Freq: Once | RECTAL | Status: AC
  Administered 2019-09-26: 10 mg via RECTAL
  Filled 2019-09-26: qty 1

## 2019-09-26 MED ORDER — SENNOSIDES-DOCUSATE SODIUM 8.6-50 MG PO TABS
1.0000 | ORAL_TABLET | Freq: Two times a day (BID) | ORAL | Status: DC
  Administered 2019-09-26 – 2019-09-27 (×3): 1 via ORAL
  Filled 2019-09-26 (×3): qty 1

## 2019-09-26 NOTE — Progress Notes (Addendum)
[facility] Progress Note  Richard [name] 79 y.o. 1940/07/05 Patient with history of stage III non-small cell lung cancer on chemoradiation and Xarelto presenting with anemia.  CC: anemia, heme + stool  Subjective: Patient is pleasantly confused.  Unable to obtain subjective information due to dementia.  Per RN, patient has not had any bowel movements today or yesterday.  No signs of GI bleeding.  Review of Systems:  Unable to obtain because of patient's dementia.  Objective: Vital signs: Vitals:   09/26/19 0733 09/26/19 0925  BP: (!) 90/50 115/65  Pulse:  89  Resp:  20  Temp:  98 F (36.7 C)  SpO2:  96%    Physical Exam  Pulmonary/Chest: Effort normal. No respiratory distress.  Abdominal: Soft. Bowel sounds are normal. He exhibits no distension and no mass. There is abdominal tenderness (mild, diffuse tenderness present, worse around site of prior feeding tube). There is no rebound and no guarding.  Musculoskeletal:        General: No deformity or edema.   Lab Results: Recent Labs    09/25/19 0621 09/26/19 0539  NA 139 139  K 3.9 4.2  CL 109 103  CO2 25 24  GLUCOSE 83 95  BUN 13 13  CREATININE 0.61 0.65  CALCIUM 9.0 9.3   Recent Labs    09/24/19 1310  AST 18  ALT 12  ALKPHOS 82  BILITOT 0.4  PROT 7.1  ALBUMIN 3.4*   Recent Labs    09/24/19 1310 09/24/19 1310 09/25/19 0621 09/26/19 0539  WBC 6.7   < > 5.5 5.9  NEUTROABS 4.8  --   --   --   HGB 8.0*   < > 8.8* 9.1*  HCT 25.9*   < > 27.2* 28.6*  MCV 69.6*   < > 70.3* 69.1*  PLT 402*   < > 331 345   < > = values in this interval not displayed.   Assessment: -Anemia, FOBT positive stool: Hemoglobin stable at 9.1 s/p transfusion of 1u on 4/10. -Constipation- abdominal xray on 4/11 showed stool and gas scattered throughout the colon. No signs of obstruction. Post G-tube removal -Dementia -Lung cancer -Xarelto use  Plan: Due to patient's comorbidities, he is not a good candidate for  anesthesia or colonoscopy (will not be able to finish prep).    Patient does not have any frank GI bleeding.  Patient has not had any bowel movements since admission so it is unlikely he has an active GI bleed.  Continue conservative therapy with daily PPI.  Continue to monitor H&H and transfuse as needed.  [facility] will sign off.  Please contact us if we can be of any further assistance during this hospital stay.  Salley Slaughter 09/26/2019, 12:02 PM  Questions please call [phone]

## 2019-09-26 NOTE — Progress Notes (Signed)
PROGRESS NOTE    Richard [name]  DTO:[id number] DOB: 1941-06-07 DOA: 09/24/2019 PCP: [name], NP   Brief Narrative: Patient is a 79 year old male with history of stage IIIa non-small cell lung CA, hypertension, hyperlipidemia, dementia presented with worsening fatigue.  Patient was noticed to be more lethargic, less active for a week PTA.  Per daughter, initially attributed to the feeding tube being removed, and was not drinking as much water.  Patient was noted to be more fatigued had not had a BM in several days. Hemoglobin was noted to be 8.0, was 11.9 on 08/31/2019  patient was admitted for possible GI bleed and symptomatic anemia  Assessment & Plan:   Active Problems:   Symptomatic anemia   Anemia  1-Symptomatic anemia Possible subacute blood loss anemia FOBT positive Tolerated by GI no evidence of acute GI bleed.  The Patient is a good candidate for colonoscopy Plan to monitor on Xarelto Received 1 unit of packed red blood cell this admission. Hb at 9 today. Start ferrous sulfate at discharge.  2-Hypotension: Related to hypovolemia: Received IV bolus blood pressure improved.  Continue with maintenance fluid  Constipation: KUB negative for obstruction Scheduled MiraLAX and Colace as needed suppository  3-paroxysmal A. Fib:  Okay to resume Xarelto per GI.  Monitor 4-History of non-small cell lung cancer stage IIIa: Follow with Dr. Earlie Server 5-Alzheimers dementia: No acute issues, delirium precaution 6-history of PE on Xarelto 7-severe protein caloric malnutrition: Started on Ensure 8-failure to thrive: Might be related to chronic illness, and constipation.     Pressure Injury 05/19/19 Hip Left;Lower Stage II -  Partial thickness loss of dermis presenting as a shallow open ulcer with a red, pink wound bed without slough. Stage II present with scab over it (Active)  05/19/19 0348  Location: Hip  Location Orientation: Left;Lower  Staging: Stage II -  Partial  thickness loss of dermis presenting as a shallow open ulcer with a red, pink wound bed without slough.  Wound Description (Comments): Stage II present with scab over it  Present on Admission: Yes     Pressure Injury 05/19/19 Hip Right;Left Stage I -  Intact skin with non-blanchable redness of a localized area usually over a bony prominence. (Active)  05/19/19 0355  Location: Hip  Location Orientation: Right;Left  Staging: Stage I -  Intact skin with non-blanchable redness of a localized area usually over a bony prominence.  Wound Description (Comments):   Present on Admission:      Nutrition Problem: Increased nutrient needs Etiology: acute illness, chronic illness, cancer and cancer related treatments    Signs/Symptoms: estimated needs    Interventions: Ensure Enlive (each supplement provides 350kcal and 20 grams of protein)  Estimated body mass index is 20.21 kg/m as calculated from the following:   Height as of 09/09/19: 6\' 2"  (1.88 m).   Weight as of this encounter: 71.4 kg.   DVT prophylaxis: SCD, Xarelto Code Status: Full code Family Communication: Disposition Plan:  Patient is from: Home Anticipated d/c date: home in 24 hours of BP improved Barriers to d/c or necessity for inpatient status: patient hypotensive this am BP 80. Received IV fluids.    Consultants:   GI  Procedures:   none  Antimicrobials:    Subjective: Had BM this am, small, per nurse no evidence of bleeding.  pleasently confuse  Objective: Vitals:   09/26/19 0733 09/26/19 0925 09/26/19 1324 09/26/19 1447  BP: (!) 90/50 115/65 99/70   Pulse:  89 92  Resp:  20 15   Temp:  98 F (36.7 C) 98.2 F (36.8 C)   TempSrc:  Oral Oral   SpO2:  96% 98%   Weight:    71.4 kg    Intake/Output Summary (Last 24 hours) at 09/26/2019 1719 Last data filed at 09/26/2019 1713 Gross per 24 hour  Intake 1020 ml  Output 400 ml  Net 620 ml   Filed Weights   09/26/19 1447  Weight: 71.4 kg     Examination:  General exam: Appears calm and comfortable  Respiratory system: Clear to auscultation. Respiratory effort normal. Cardiovascular system: S1 & S2 heard, RRR. No JVD, murmurs, rubs, gallops or clicks. No pedal edema. Gastrointestinal system: Abdomen is nondistended, soft and nontender. No organomegaly or masses felt. Normal bowel sounds heard. Central nervous system: Alert  Extremities: Symmetric 5 x 5 power. Skin: No rashes, lesions or ulcers   Data Reviewed: I have personally reviewed following labs and imaging studies  CBC: Recent Labs  Lab 09/24/19 1310 09/25/19 0621 09/26/19 0539  WBC 6.7 5.5 5.9  NEUTROABS 4.8  --   --   HGB 8.0* 8.8* 9.1*  HCT 25.9* 27.2* 28.6*  MCV 69.6* 70.3* 69.1*  PLT 402* 331 096   Basic Metabolic Panel: Recent Labs  Lab 09/24/19 1310 09/25/19 0621 09/26/19 0539  NA 140 139 139  K 4.1 3.9 4.2  CL 106 109 103  CO2 28 25 24   GLUCOSE 155* 83 95  BUN 15 13 13   CREATININE 0.74 0.61 0.65  CALCIUM 9.2 9.0 9.3   GFR: Estimated Creatinine Clearance: 78.1 mL/min (by C-G formula based on SCr of 0.65 mg/dL). Liver Function Tests: Recent Labs  Lab 09/24/19 1310  AST 18  ALT 12  ALKPHOS 82  BILITOT 0.4  PROT 7.1  ALBUMIN 3.4*   No results for input(s): LIPASE, AMYLASE in the last 168 hours. No results for input(s): AMMONIA in the last 168 hours. Coagulation Profile: No results for input(s): INR, PROTIME in the last 168 hours. Cardiac Enzymes: No results for input(s): CKTOTAL, CKMB, CKMBINDEX, TROPONINI in the last 168 hours. BNP (last 3 results) No results for input(s): PROBNP in the last 8760 hours. HbA1C: No results for input(s): HGBA1C in the last 72 hours. CBG: Recent Labs  Lab 09/25/19 0714 09/26/19 0733  GLUCAP 90 86   Lipid Profile: No results for input(s): CHOL, HDL, LDLCALC, TRIG, CHOLHDL, LDLDIRECT in the last 72 hours. Thyroid Function Tests: No results for input(s): TSH, T4TOTAL, FREET4, T3FREE,  THYROIDAB in the last 72 hours. Anemia Panel: Recent Labs    09/25/19 1358  VITAMINB12 483  FOLATE 37.5  FERRITIN 13*  TIBC 473*  IRON 172  RETICCTPCT 1.9   Sepsis Labs: No results for input(s): PROCALCITON, LATICACIDVEN in the last 168 hours.  Recent Results (from the past 240 hour(s))  Respiratory Panel by RT PCR (Flu A&B, Covid) - Nasopharyngeal Swab     Status: None   Collection Time: 09/24/19  1:10 PM   Specimen: Nasopharyngeal Swab  Result Value Ref Range Status   SARS Coronavirus 2 by RT PCR NEGATIVE NEGATIVE Final    Comment: (NOTE) SARS-CoV-2 target nucleic acids are NOT DETECTED. The SARS-CoV-2 RNA is generally detectable in upper respiratoy specimens during the acute phase of infection. The lowest concentration of SARS-CoV-2 viral copies this assay can detect is 131 copies/mL. A negative result does not preclude SARS-Cov-2 infection and should not be used as the sole basis for treatment or other patient management  decisions. A negative result may occur with  improper specimen collection/handling, submission of specimen other than nasopharyngeal swab, presence of viral mutation(s) within the areas targeted by this assay, and inadequate number of viral copies (<131 copies/mL). A negative result must be combined with clinical observations, patient history, and epidemiological information. The expected result is Negative. Fact Sheet for Patients:  PinkCheek.be Fact Sheet for Healthcare Providers:  GravelBags.it This test is not yet ap proved or cleared by the [place] FDA and  has been authorized for detection and/or diagnosis of SARS-CoV-2 by FDA under an Emergency Use Authorization (EUA). This EUA will remain  in effect (meaning this test can be used) for the duration of the COVID-19 declaration under Section 564(b)(1) of the Act, 21 U.S.C. section 360bbb-3(b)(1), unless the authorization is terminated  or revoked sooner.    Influenza A by PCR NEGATIVE NEGATIVE Final   Influenza B by PCR NEGATIVE NEGATIVE Final    Comment: (NOTE) The Xpert Xpress SARS-CoV-2/FLU/RSV assay is intended as an aid in  the diagnosis of influenza from Nasopharyngeal swab specimens and  should not be used as a sole basis for treatment. Nasal washings and  aspirates are unacceptable for Xpert Xpress SARS-CoV-2/FLU/RSV  testing. Fact Sheet for Patients: PinkCheek.be Fact Sheet for Healthcare Providers: GravelBags.it This test is not yet approved or cleared by the [place] FDA and  has been authorized for detection and/or diagnosis of SARS-CoV-2 by  FDA under an Emergency Use Authorization (EUA). This EUA will remain  in effect (meaning this test can be used) for the duration of the  Covid-19 declaration under Section 564(b)(1) of the Act, 21  U.S.C. section 360bbb-3(b)(1), unless the authorization is  terminated or revoked. Performed at [facility], [place] [address], [place], [place] [address]          Radiology Studies: DG Abd 1 View  Result Date: 09/25/2019 CLINICAL DATA:  Constipation. EXAM: ABDOMEN - 1 VIEW COMPARISON:  CT chest 05/26/2019 FINDINGS: Stool and gas scattered throughout the colon. Colon is nondistended. Scattered loops of gas-filled small bowel. Metallic density over the left upper quadrant likely reflects fastener from previous G-tube. Signs of previous vascular stenting of iliac vessels. No acute bone process. Post left hip arthroplasty. Spinal degenerative changes. IMPRESSION: Stool and gas scattered throughout the colon. No signs of obstruction. Post G-tube removal Electronically Signed   By: Zetta Bills M.D.   On: 09/25/2019 12:24        Scheduled Meds: . feeding supplement (ENSURE ENLIVE)  237 mL Oral TID BM  . ferrous sulfate  325 mg Oral BID WC  . multivitamin with minerals  1 tablet Oral  Daily  . pantoprazole  40 mg Oral Daily  . polyethylene glycol  17 g Oral BID  . rivaroxaban  20 mg Oral Q supper  . senna-docusate  1 tablet Oral BID   Continuous Infusions: . sodium chloride 100 mL/hr at 09/26/19 1449     LOS: 1 day    Time spent: 35 minutes    Judea Riches A Sharika Mosquera, MD [facility]   If 7PM-7AM, please contact night-coverage [email or web address]  09/26/2019, 5:19 PM

## 2019-09-26 NOTE — Progress Notes (Signed)
AMION page sent to Dr. Tyrell Antonio about patient low BP.   VS this morning around 0730 T 98.6 BP 89/50 HR 90 RR 20 O2 100% RA  Rechecked BP manually and it is 90/50.   Patient is asymptomatic and is lying in the bed.

## 2019-09-26 NOTE — Progress Notes (Signed)
Initial Nutrition Assessment  DOCUMENTATION CODES:   Not applicable  INTERVENTION:  - continue Ensure Enlive TID, each supplement provides 350 kcal and 20 grams of protein. - will order Magic Cup with lunch meals, each supplement provides 290 kcal and 9 grams of protein. - continue to encourage PO intakes.  - will complete NFPE at follow-up. - weigh patient today.    NUTRITION DIAGNOSIS:   Increased nutrient needs related to acute illness, chronic illness, cancer and cancer related treatments as evidenced by estimated needs.  GOAL:   Patient will meet greater than or equal to 90% of their needs  MONITOR:   PO intake, Supplement acceptance, Labs, Weight trends  REASON FOR ASSESSMENT:   Diagnosis  ASSESSMENT:   79 year old male with history of stage IIIa NSCLC, HTN, hyperlipidemia, and dementia. He presented to the ED due to worsening fatigue and decreased activity x1 week. Per daughter, initially attributed to the feeding tube being removed, and was not drinking as much water. He had not had a BM in several days PTA. He was admitted for possible GIB and symptomatic anemia.  Patient noted to be a/o to self only. He was sleeping soundly at the time of visit with no family/visitors present. Did not wake patient up or attempt NFPE at this time.   Flow sheet documentation indicates he consumed 100% of breakfast (765 kcal, 28 grams protein). Ensure Enlive ordered TID on 4/10 and patient has accepted all 5 bottles offered.   Able to talk with RN and Tech who report patient is able to self feed and that he ate well for lunch--1/2 ham and cheese sandwich and a bag of chips.   Patient is known to this RD from previous hospitalizations. He had a PEG placed on 05/25/19 and it was removed on 09/08/19. Patient is known to [facility] RD as well.   Per chart review, weight on 3/26 was 148 lb (the most recent weight recorded), weight on 05/19/19 was 134 lb, and weight on 05/08/19 was 140 lb.    Per notes: - possible subacute blood loss anemia--positive FOBT - GI following and recommends conservative management - constipation without obstruction--bowel regimen ordered  - hx of Alzheimer's dementia - severe protein calorie malnutrition - discharge home as soon as today (4/12)   Labs reviewed. Medications reviewed; 10 mg rectal dulcolax x1 dose 4/12, 325 mg ferrous sulfate BID, 40 mg oral protonix/day, 1 packet miralax BID, 1 tablet senokot BID. IVF; NS @ 100 ml/hr.     NUTRITION - FOCUSED PHYSICAL EXAM:  unable to complete at this time.   Diet Order:   Diet Order            Diet regular Room service appropriate? Yes; Fluid consistency: Thin  Diet effective now              EDUCATION NEEDS:   No education needs have been identified at this time  Skin:  Skin Assessment: Reviewed RN Assessment  Last BM:  PTA/unknown  Height:   Ht Readings from Last 1 Encounters:  09/09/19 6\' 2"  (1.88 m)    Weight:   Wt Readings from Last 1 Encounters:  09/09/19 67.5 kg    Ideal Body Weight:  86.4 kg  BMI:  There is no height or weight on file to calculate BMI.  Estimated Nutritional Needs:   Kcal:  2025-2295 kcal  Protein:  105-120 grams  Fluid:  >/= 2.3 L/day     Jarome Matin, MS, RD, LDN, CNSC Inpatient  Clinical Dietitian RD pager # available in [place]  After hours/weekend pager # available in [facility]

## 2019-09-26 NOTE — TOC Initial Note (Signed)
Transition of Care [facility]) - Initial/Assessment Note    Patient Details  Name: Richard [name] MRN: [id number] Date of Birth: 1941-03-16  Transition of [facility]) CM/SW Contact:    Trish Mage, LCSW Phone Number: 09/26/2019, 10:27 AM  Clinical Narrative:   Patient seen in follow up to PT/OT recommendation for [facility] services.  Spoke to daughter, who confirms patient stays with her, that he has DME of [employer] [facility], though he rarely uses them, and that she is open to referral for services.  Was recently working with [facility] prior to feeding tube being removed.  Called Santiago Glad with [facility] to make referral.  She is forwarding on to [place] and they will get back with me. TOC will continue to follow during the course of hospitalization.                 Expected Discharge Plan: [place] Barriers to Discharge: No Barriers Identified   Patient Goals and CMS Choice Patient states their goals for this hospitalization and ongoing recovery are:: Go home   Choice offered to / list presented to : [facility] POA / Guardian  Expected Discharge Plan and Services Expected Discharge Plan: [place]   Discharge Planning Services: CM Consult Post Acute Care Choice: [place] arrangements for the past 2 months: Single Family Home                                      Prior Living Arrangements/Services Living arrangements for the past 2 months: Single Family Home Lives with:: Adult Children Patient language and need for interpreter reviewed:: Yes Do you feel safe going back to the place where you live?: Yes      Need for Family Participation in Patient Care: Yes (Comment) Care giver support system in place?: Yes (comment) Current home services: DME Criminal Activity/Legal Involvement Pertinent to Current Situation/Hospitalization: No - Comment as needed  Activities of Daily Living      Permission Sought/Granted Permission sought to share information  with : Family Supports Permission granted to share information with : Yes, Verbal Permission Granted  Share Information with NAME: Arda Keadle     Permission granted to share info w Relationship: daughter  Permission granted to share info w Contact Information: [phone]  Emotional Assessment       Orientation: : Oriented to Self Alcohol / Substance Use: Not Applicable Psych Involvement: No (comment)  Admission diagnosis:  Heme positive stool [R19.5] Generalized weakness [R53.1] Symptomatic anemia [D64.9] Malignant neoplasm of right lung, unspecified part of lung ([place]) [C34.91] Anemia [D64.9] Patient Active Problem List   Diagnosis Date Noted  . Anemia 09/25/2019  . Symptomatic anemia 09/24/2019  . Paroxysmal atrial fibrillation ([place]) 07/06/2019  . Pressure injury of skin 05/21/2019  . Anorexia 05/20/2019  . HCAP (healthcare-associated pneumonia) 05/18/2019  . Protein-calorie malnutrition, severe ([place]) 05/18/2019  . Sepsis ([place]) 05/08/2019  . History of pulmonary embolism 03/30/2019  . Pure hypercholesterolemia 03/30/2019  . SOB (shortness of breath) 03/30/2019  . Atrial fibrillation with RVR ([place])   . Palliative care by specialist   . DNR (do not resuscitate) discussion   . Severe sepsis ([place]) 01/30/2019  . Acute pulmonary embolism ([place]) 01/30/2019  . Acute respiratory failure with hypoxia ([place]) 01/30/2019  . AKI (acute kidney injury) ([place]) 01/30/2019  . Poor appetite 01/05/2019  . Goals of care, counseling/discussion 12/28/2018  .  Encounter for antineoplastic chemotherapy 12/28/2018  . 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 Dementia without behavioral disturbance ([place]) 12/16/2018  . Lung mass 12/15/2018  . Pelvic mass in male 12/15/2018  . Postobstructive pneumonia 12/15/2018  . Hypertension    PCP:  [name], NP Pharmacy:   [place], [place] - [address] [place] [address] Phone: [phone] Fax: [phone]  [place]), [place] - [address] [place] [address] Phone: [phone] Fax: [place], [place] [address] Phone: [phone] Fax: [phone]     Social Determinants of Health ([place]) Interventions    Readmission Risk Interventions Readmission Risk Prevention Plan 05/30/2019  Transportation Screening Complete  Medication Review ([place]) Complete  PCP or Specialist appointment within 3-5 days of discharge Complete  HRI or [place] Complete  SW Recovery Care/Counseling Consult Complete  [place] Not Applicable  Some recent data might be hidden

## 2019-09-27 LAB — CBC
HCT: 26.5 % — ABNORMAL LOW (ref 39.0–52.0)
Hemoglobin: 8.4 g/dL — ABNORMAL LOW (ref 13.0–17.0)
MCH: 22.3 pg — ABNORMAL LOW (ref 26.0–34.0)
MCHC: 31.7 g/dL (ref 30.0–36.0)
MCV: 70.5 fL — ABNORMAL LOW (ref 80.0–100.0)
Platelets: 305 10*3/uL (ref 150–400)
RBC: 3.76 MIL/uL — ABNORMAL LOW (ref 4.22–5.81)
RDW: 18.6 % — ABNORMAL HIGH (ref 11.5–15.5)
WBC: 6.8 10*3/uL (ref 4.0–10.5)
nRBC: 0 % (ref 0.0–0.2)

## 2019-09-27 LAB — BASIC METABOLIC PANEL
Anion gap: 8 (ref 5–15)
BUN: 16 mg/dL (ref 8–23)
CO2: 24 mmol/L (ref 22–32)
Calcium: 9.2 mg/dL (ref 8.9–10.3)
Chloride: 108 mmol/L (ref 98–111)
Creatinine, Ser: 0.7 mg/dL (ref 0.61–1.24)
GFR calc Af Amer: >60 mL/min (ref >60)
GFR calc non Af Amer: >60 mL/min (ref >60)
Glucose, Bld: 91 mg/dL (ref 70–99)
Potassium: 4.1 mmol/L (ref 3.5–5.1)
Sodium: 140 mmol/L (ref 135–145)

## 2019-09-27 LAB — GLUCOSE, CAPILLARY: Glucose-Capillary: 94 mg/dL (ref 70–99)

## 2019-09-27 MED ORDER — POLYETHYLENE GLYCOL 3350 17 G PO PACK: 17.0000 g | PACK | Freq: Two times a day (BID) | ORAL | 0 refills | Status: DC

## 2019-09-27 MED ORDER — FERROUS SULFATE 325 (65 FE) MG PO TABS: 325.0000 mg | ORAL_TABLET | Freq: Every day | ORAL | 3 refills | Status: DC

## 2019-09-27 MED ORDER — PANTOPRAZOLE SODIUM 40 MG PO TBEC: 40.0000 mg | DELAYED_RELEASE_TABLET | Freq: Every day | ORAL | 0 refills | Status: DC

## 2019-09-27 MED ORDER — SENNOSIDES-DOCUSATE SODIUM 8.6-50 MG PO TABS: 1.0000 | ORAL_TABLET | Freq: Two times a day (BID) | ORAL | 0 refills | Status: DC

## 2019-09-27 MED FILL — PANTOPRAZOLE SOD DR 40 MG T: 40 | 30 days supply | Qty: 30 | Fill #0

## 2019-09-27 MED FILL — FERROUS SULFATE 325 MG TAB: 325 (65 FE) | 30 days supply | Qty: 30 | Fill #0

## 2019-09-27 MED FILL — POLYETHYLENE GLYCOL 3350 PO: 17 | 7 days supply | Qty: 238 | Fill #0

## 2019-09-27 MED FILL — STOOL SOFTENER-LAXATIVE TAB: 50-8.6 | 15 days supply | Qty: 30 | Fill #0

## 2019-09-27 NOTE — Discharge Summary (Signed)
Physician Discharge Summary  Richard [name] HYI:[id number] DOB: 03/15/1941 DOA: 09/24/2019  PCP: [name], NP  Admit date: 09/24/2019 Discharge date: 09/27/2019  Admitted From: Home  Disposition:  Home   Recommendations for Outpatient Follow-up:  1. Follow up with PCP in 1-2 weeks 2. Please obtain BMP/CBC in one week 3. Further evaluation of anemia.    Discharge Condition: Stable.  CODE STATUS: Full code Diet recommendation: Heart Healthy   Brief/Interim Summary: Patient is a 79 year old male with history of stage IIIa non-small cell lung CA, hypertension, hyperlipidemia, dementia presented with worsening fatigue. Patient was noticed to be more lethargic, less active for a week PTA. Per daughter, initially attributed to the feeding tube being removed, and was not drinking as much water. Patient was noted to be more fatiguedhad not had a BM in several days. Hemoglobin was noted to be 8.0, was 11.9 on 08/31/2019  patient was admitted for possible GI bleed and symptomatic anemia.  1-Symptomatic anemia Possible subacute blood loss anemia FOBT positive Tolerated by GI no evidence of acute GI bleed.  The Patient is a good candidate for colonoscopy Plan to monitor on Xarelto Received 1 unit of packed red blood cell this admission. Hb stable 8.0--8.8--9.1--8.4 Started  ferrous sulfate at discharge. Needs close follow up out patient.   2-Hypotension: Related to hypovolemia: Received IV bolus blood pressure improved.  Continue with maintenance fluid Resolved.   Constipation: KUB negative for obstruction Scheduled MiraLAX and Colace as needed suppository Has had 2 BM during this hospitalization.  Discharge on laxative.   3-paroxysmal A. Fib:  Okay to resume Xarelto per GI.  Monitor 4-History of non-small cell lung cancer stage IIIa: Follow with Dr. 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 Per nurse staff, patient has been eating more.   Discharge Diagnoses:  Active Problems:   Symptomatic anemia   Anemia    Discharge Instructions  Discharge Instructions    Diet - low sodium heart healthy   Complete by: As directed    Increase activity slowly   Complete by: As directed      Allergies as of 09/27/2019   No Known Allergies     Medication List    STOP taking these medications   feeding supplement (OSMOLITE 1.5 CAL) Liqd   feeding supplement (PRO-STAT SUGAR FREE 64) Liqd   free water Soln     TAKE these medications   ferrous sulfate 325 (65 FE) MG tablet Take 1 tablet (325 mg total) by mouth daily with breakfast. What changed: when to take this   multivitamin tablet Take 1 tablet by mouth daily.   pantoprazole 40 MG tablet Commonly known as: PROTONIX Take 1 tablet (40 mg total) by mouth daily. Start taking on: September 28, 2019   polyethylene glycol 17 g packet Commonly known as: MIRALAX / GLYCOLAX Take 17 g by mouth 2 (two) times daily.   senna-docusate 8.6-50 MG tablet Commonly known as: Senokot-S Take 1 tablet by mouth 2 (two) times daily.   Xarelto 20 MG Tabs tablet Generic drug: rivaroxaban TAKE 1 TABLET BY MOUTH DAILY WITH SUPPER What changed: See the new instructions.      Follow-up Information    [name], NP Follow up in 1 week(s).   Specialty: Internal Medicine Contact information: [place] [place] [address] [phone]  Jerline Pain, MD .   Specialty: Cardiology Contact information: [phone] [address] [phone]          No Known Allergies  Consultations: GI, [facility].   Procedures/Studies: DG Abd 1 View  Result Date: 09/25/2019 CLINICAL DATA:  Constipation. EXAM: ABDOMEN - 1 VIEW COMPARISON:  CT chest 05/26/2019 FINDINGS: Stool and gas  scattered throughout the colon. Colon is nondistended. Scattered loops of gas-filled small bowel. Metallic density over the left upper quadrant likely reflects fastener from previous G-tube. Signs of previous vascular stenting of iliac vessels. No acute bone process. Post left hip arthroplasty. Spinal degenerative changes. IMPRESSION: Stool and gas scattered throughout the colon. No signs of obstruction. Post G-tube removal Electronically Signed   By: Zetta Bills M.D.   On: 09/25/2019 12:24   CT Chest W Contrast  Result Date: 08/31/2019 CLINICAL DATA:  Restaging lung cancer. EXAM: CT CHEST WITH CONTRAST TECHNIQUE: Multidetector CT imaging of the chest was performed during intravenous contrast administration. CONTRAST:  43mL OMNIPAQUE IOHEXOL 300 MG/ML  SOLN COMPARISON:  05/26/2019. FINDINGS: Cardiovascular: Atherosclerotic calcification of the aorta and coronary arteries. Heart size normal. No pericardial effusion. Mediastinum/Nodes: No pathologically enlarged mediastinal, left hilar or axillary lymph nodes. There is soft tissue thickening in the right hilum, similar to the prior exam. Esophagus is grossly unremarkable. Lungs/Pleura: Centrilobular emphysema. Decrease in size of an area of cavitation in the central right middle lobe (8/89). Measurement is difficult with persistent right middle lobe collapse. Increasing ground-glass in the inferior right upper lobe and right lower lobe which obscures a previously measured nodule in the inferior right upper lobe. A separate nodule in the posterior segment right upper lobe on the prior study is no longer visualized. New small right pleural effusion. 5 mm peripheral left lower lobe nodule (8/112), stable. Mild chronic appearing volume loss in the posteromedial left lower lobe. No left pleural fluid. Airway is unremarkable. Upper Abdomen: Visualized portions of the liver, gallbladder, adrenal glands, kidneys, spleen, pancreas, stomach and bowel are grossly  unremarkable. No upper abdominal adenopathy. Musculoskeletal: Degenerative changes in the spine. No worrisome lytic or sclerotic lesions. IMPRESSION: 1. Continued regression of a cavitary lesion in the perihilar right middle lobe. Persistent associated right middle lobe collapse. Expected changes of radiation therapy in the right upper and right lower lobes. Previously measured right upper lobe nodules have resolved or are obscured. 2. Small right pleural effusion, new. 3. 5 mm left lower lobe nodule, stable. 4.  Aortic atherosclerosis (ICD10-I70.0). 5.  Emphysema (ICD10-J43.9). Electronically Signed   By: Lorin Picket M.D.   On: 08/31/2019 14:12   DG Chest Port 1 View  Result Date: 09/24/2019 CLINICAL DATA:  History of lung cancer. Change in mental status. EXAM: PORTABLE CHEST 1 VIEW COMPARISON:  Chest x-ray May 23, 2019. CT scan August 31, 2019. FINDINGS: Opacity in the right base is mildly worsened compared to May 23, 2019. The heart, hila, and mediastinum are stable. No other pulmonary infiltrates. No pneumothorax. No other acute abnormalities. IMPRESSION: 1. Opacity in the right base is mildly more prominent compared to May 23, 2019. While some of these changes are consistent with the patient's known malignancy and treatment, a superimposed infiltrate such as pneumonia or aspiration is not excluded. Electronically Signed   By: Dorise Bullion III M.D   On: 09/24/2019 12:22   IR GASTROSTOMY TUBE REMOVAL  Result Date: 09/08/2019 INDICATION: Patient with history of malnutrition s/p percutaneous pull-through 20 French gastrostomy tube placement  in IR 05/25/2019 by Dr. Anselm Pancoast. Patient now tolerating p.o. intake and gaining weight. Request is made for gastrostomy tube removal. EXAM: IR GASTROSTOMY TUBE REMOVAL MEDICATIONS: 15 cc viscous lidocaine ANESTHESIA/SEDATION: None CONTRAST:  None FLUOROSCOPY TIME:  None COMPLICATIONS: None immediate. PROCEDURE: Informed written consent was obtained from  the patient after a thorough discussion of the procedural risks, benefits and alternatives. All questions were addressed. Maximal Sterile Barrier Technique was utilized including mask, sterile gloves, hand hygiene and skin antiseptic. A timeout was performed prior to the initiation of the procedure. 15 cc viscous lidocaine was placed down gastrostomy tube track 5-10 minutes prior to removal. Using traction, gastrostomy tube was removed intact. Dressing (gauze and tape) was placed over site. IMPRESSION: Successful removal of percutaneous pull-through 20 French gastrostomy tube. Read by: Earley Abide, PA-C Electronically Signed   By: Sandi Mariscal M.D.   On: 09/08/2019 11:24      Subjective: Had BM, eating more. Feels well. No evidence of GI bleed.   Discharge Exam: Vitals:   09/26/19 1929 09/27/19 0352  BP: 119/62 107/62  Pulse: 96 99  Resp: 16 18  Temp: 98.5 F (36.9 C) 98.3 F (36.8 C)  SpO2: 99% 97%     General: Pt is alert, awake, not in acute distress Cardiovascular: RRR, S1/S2 +, no rubs, no gallops Respiratory: CTA bilaterally, no wheezing, no rhonchi Abdominal: Soft, NT, ND, bowel sounds + Extremities: no edema, no cyanosis    The results of significant diagnostics from this hospitalization (including imaging, microbiology, ancillary and laboratory) are listed below for reference.     Microbiology: Recent Results (from the past 240 hour(s))  Respiratory Panel by RT PCR (Flu A&B, Covid) - Nasopharyngeal Swab     Status: None   Collection Time: 09/24/19  1:10 PM   Specimen: Nasopharyngeal Swab  Result Value Ref Range Status   SARS Coronavirus 2 by RT PCR NEGATIVE NEGATIVE Final    Comment: (NOTE) SARS-CoV-2 target nucleic acids are NOT DETECTED. The SARS-CoV-2 RNA is generally detectable in upper respiratoy specimens during the acute phase of infection. The lowest concentration of SARS-CoV-2 viral copies this assay can detect is 131 copies/mL. A negative result does  not preclude SARS-Cov-2 infection and should not be used as the sole basis for treatment or other patient management decisions. A negative result may occur with  improper specimen collection/handling, submission of specimen other than nasopharyngeal swab, presence of viral mutation(s) within the areas targeted by this assay, and inadequate number of viral copies (<131 copies/mL). A negative result must be combined with clinical observations, patient history, and epidemiological information. The expected result is Negative. Fact Sheet for Patients:  PinkCheek.be Fact Sheet for Healthcare Providers:  GravelBags.it This test is not yet ap proved or cleared by the [place] FDA and  has been authorized for detection and/or diagnosis of SARS-CoV-2 by FDA under an Emergency Use Authorization (EUA). This EUA will remain  in effect (meaning this test can be used) for the duration of the COVID-19 declaration under Section 564(b)(1) of the Act, 21 U.S.C. section 360bbb-3(b)(1), unless the authorization is terminated or revoked sooner.    Influenza A by PCR NEGATIVE NEGATIVE Final   Influenza B by PCR NEGATIVE NEGATIVE Final    Comment: (NOTE) The Xpert Xpress SARS-CoV-2/FLU/RSV assay is intended as an aid in  the diagnosis of influenza from Nasopharyngeal swab specimens and  should not be used as a sole basis for treatment. Nasal washings and  aspirates are unacceptable for Xpert  Xpress SARS-CoV-2/FLU/RSV  testing. Fact Sheet for Patients: PinkCheek.be Fact Sheet for Healthcare Providers: GravelBags.it This test is not yet approved or cleared by the [place] FDA and  has been authorized for detection and/or diagnosis of SARS-CoV-2 by  FDA under an Emergency Use Authorization (EUA). This EUA will remain  in effect (meaning this test can be used) for the duration of the   Covid-19 declaration under Section 564(b)(1) of the Act, 21  U.S.C. section 360bbb-3(b)(1), unless the authorization is  terminated or revoked. Performed at [facility], [place] [address], [place], [place] [address]      Labs: BNP (last 3 results) No results for input(s): BNP in the last 8760 hours. Basic Metabolic Panel: Recent Labs  Lab 09/24/19 1310 09/25/19 0621 09/26/19 0539 09/27/19 0608  NA 140 139 139 140  K 4.1 3.9 4.2 4.1  CL 106 109 103 108  CO2 28 25 24 24   GLUCOSE 155* 83 95 91  BUN 15 13 13 16   CREATININE 0.74 0.61 0.65 0.70  CALCIUM 9.2 9.0 9.3 9.2   Liver Function Tests: Recent Labs  Lab 09/24/19 1310  AST 18  ALT 12  ALKPHOS 82  BILITOT 0.4  PROT 7.1  ALBUMIN 3.4*   No results for input(s): LIPASE, AMYLASE in the last 168 hours. No results for input(s): AMMONIA in the last 168 hours. CBC: Recent Labs  Lab 09/24/19 1310 09/25/19 0621 09/26/19 0539 09/27/19 0608  WBC 6.7 5.5 5.9 6.8  NEUTROABS 4.8  --   --   --   HGB 8.0* 8.8* 9.1* 8.4*  HCT 25.9* 27.2* 28.6* 26.5*  MCV 69.6* 70.3* 69.1* 70.5*  PLT 402* 331 345 305   Cardiac Enzymes: No results for input(s): CKTOTAL, CKMB, CKMBINDEX, TROPONINI in the last 168 hours. BNP: Invalid input(s): POCBNP CBG: Recent Labs  Lab 09/25/19 0714 09/26/19 0733 09/27/19 0738  GLUCAP 90 86 94   D-Dimer No results for input(s): DDIMER in the last 72 hours. Hgb A1c No results for input(s): HGBA1C in the last 72 hours. Lipid Profile No results for input(s): CHOL, HDL, LDLCALC, TRIG, CHOLHDL, LDLDIRECT in the last 72 hours. Thyroid function studies No results for input(s): TSH, T4TOTAL, T3FREE, THYROIDAB in the last 72 hours.  Invalid input(s): FREET3 Anemia work up Recent Labs    09/25/19 1358  VITAMINB12 483  FOLATE 37.5  FERRITIN 13*  TIBC 473*  IRON 172  RETICCTPCT 1.9   Urinalysis    Component Value Date/Time   COLORURINE YELLOW 09/24/2019 1310   APPEARANCEUR  CLOUDY (A) 09/24/2019 1310   LABSPEC 1.013 09/24/2019 1310   PHURINE 7.0 09/24/2019 1310   GLUCOSEU NEGATIVE 09/24/2019 1310   HGBUR NEGATIVE 09/24/2019 1310   BILIRUBINUR NEGATIVE 09/24/2019 1310   KETONESUR NEGATIVE 09/24/2019 1310   PROTEINUR NEGATIVE 09/24/2019 1310   NITRITE POSITIVE (A) 09/24/2019 1310   LEUKOCYTESUR SMALL (A) 09/24/2019 1310   Sepsis Labs Invalid input(s): PROCALCITONIN,  WBC,  LACTICIDVEN Microbiology Recent Results (from the past 240 hour(s))  Respiratory Panel by RT PCR (Flu A&B, Covid) - Nasopharyngeal Swab     Status: None   Collection Time: 09/24/19  1:10 PM   Specimen: Nasopharyngeal Swab  Result Value Ref Range Status   SARS Coronavirus 2 by RT PCR NEGATIVE NEGATIVE Final    Comment: (NOTE) SARS-CoV-2 target nucleic acids are NOT DETECTED. The SARS-CoV-2 RNA is generally detectable in upper respiratoy specimens during the acute phase of infection. The lowest concentration of SARS-CoV-2 viral copies  this assay can detect is 131 copies/mL. A negative result does not preclude SARS-Cov-2 infection and should not be used as the sole basis for treatment or other patient management decisions. A negative result may occur with  improper specimen collection/handling, submission of specimen other than nasopharyngeal swab, presence of viral mutation(s) within the areas targeted by this assay, and inadequate number of viral copies (<131 copies/mL). A negative result must be combined with clinical observations, patient history, and epidemiological information. The expected result is Negative. Fact Sheet for Patients:  PinkCheek.be Fact Sheet for Healthcare Providers:  GravelBags.it This test is not yet ap proved or cleared by the [place] FDA and  has been authorized for detection and/or diagnosis of SARS-CoV-2 by FDA under an Emergency Use Authorization (EUA). This EUA will remain  in effect  (meaning this test can be used) for the duration of the COVID-19 declaration under Section 564(b)(1) of the Act, 21 U.S.C. section 360bbb-3(b)(1), unless the authorization is terminated or revoked sooner.    Influenza A by PCR NEGATIVE NEGATIVE Final   Influenza B by PCR NEGATIVE NEGATIVE Final    Comment: (NOTE) The Xpert Xpress SARS-CoV-2/FLU/RSV assay is intended as an aid in  the diagnosis of influenza from Nasopharyngeal swab specimens and  should not be used as a sole basis for treatment. Nasal washings and  aspirates are unacceptable for Xpert Xpress SARS-CoV-2/FLU/RSV  testing. Fact Sheet for Patients: PinkCheek.be Fact Sheet for Healthcare Providers: GravelBags.it This test is not yet approved or cleared by the [place] FDA and  has been authorized for detection and/or diagnosis of SARS-CoV-2 by  FDA under an Emergency Use Authorization (EUA). This EUA will remain  in effect (meaning this test can be used) for the duration of the  Covid-19 declaration under Section 564(b)(1) of the Act, 21  U.S.C. section 360bbb-3(b)(1), unless the authorization is  terminated or revoked. Performed at [facility], [place] [address] [place], [place] [address]      Time coordinating discharge: 40 minutes  SIGNED:   Elmarie Shiley, MD  [facility]

## 2019-09-27 NOTE — Progress Notes (Signed)
  Pt is being discharged home today. Discharge instructions including medications and follow up appointments given. Pt had no further questions at this time. 

## 2019-09-27 NOTE — Progress Notes (Signed)
Went over discharge instructions w/ pt daughter. She is aware of new medications and where to pick medications up from.

## 2019-09-28 ENCOUNTER — Telehealth: Payer: Self-pay

## 2019-09-28 LAB — TYPE AND SCREEN
ABO/RH(D): AB POS
Antibody Screen: POSITIVE
Unit division: 0
Unit division: 0
Unit division: 0
Unit division: 0

## 2019-09-28 LAB — BPAM RBC
Blood Product Expiration Date: 202104252359
Blood Product Expiration Date: 202104252359
Blood Product Expiration Date: 202104252359
Blood Product Expiration Date: 202104262359
ISSUE DATE / TIME: 202104102032
ISSUE DATE / TIME: 202104121237
ISSUE DATE / TIME: 202104131004
Unit Type and Rh: 6200
Unit Type and Rh: 6200
Unit Type and Rh: 6200
Unit Type and Rh: 6200

## 2019-09-28 NOTE — Telephone Encounter (Signed)
Transition Care Management Follow-up Telephone Call  Date of discharge and from where: [facility] 09/27/2019  How have you been since you were released from the hospital? Spoke with the daughter lennie vasco /pt near / daughter stated he is feeling "ok little disoriented"   Daughter stated she resume Ensure / well tolerated  Any questions or concerns? Daughter asked about xarelto / as per ER MD DR Tyrell Antonio B notes is "Okay to resume Xarelto per GI". Message given to the daughter   Items Reviewed:  Did the pt receive and understand the discharge instructions provided? Yes   Medications obtained and verified? daughter stated she picked them up/ reviewed with daughter   Any new allergies since your discharge? None verbalized by daughter   Dietary orders reviewed? Made daughter aware low sodium diet   Do you have support at home? Yes father lives with daughter Kendell Sagraves  Functional Questionnaire: (I = Independent and D = Dependent) ADLs: needs assistance / daughter assist when needed    Follow up appointments reviewed:   PCP Hospital f/u appt confirmed? Scheduled to see Juluis Mire on 10/05/2019 @ 10:50am  Specialist Hospital f/u appt confirmed? Scheduled to see DR Julien Nordmann /oncology on 12/05/2019 @ 2:pm  Are transportation arrangements needed? No daughter will take him when needed  If their condition worsens, is the pt aware to call PCP or go to the Emergency Dept.? YES MADE DAUGHTER AWARE /  Was the patient provided with contact information for the PCP's office or ED? Yes daughter stated has all necessary informations and that she has been taking care of his father for a while/ Was to pt encouraged to call back with questions or concerns? YES /Instructed to call the office    Other DME  Pt has has DME of [facility] and [facility]. Daughter has been contacted by [facility] to resume care . Has contact info and phone nr.

## 2019-10-05 ENCOUNTER — Other Ambulatory Visit: Payer: Self-pay

## 2019-10-05 ENCOUNTER — Telehealth (INDEPENDENT_AMBULATORY_CARE_PROVIDER_SITE_OTHER): Payer: Medicare HMO | Admitting: Primary Care

## 2019-10-05 DIAGNOSIS — D500 Iron deficiency anemia secondary to blood loss (chronic): Secondary | ICD-10-CM | POA: Diagnosis not present

## 2019-10-05 DIAGNOSIS — Z09 Encounter for follow-up examination after completed treatment for conditions other than malignant neoplasm: Secondary | ICD-10-CM | POA: Diagnosis not present

## 2019-10-05 NOTE — Progress Notes (Signed)
Virtual Visit via Telephone Note  I connected with Richard [name] on 10/05/19 at 10:50 AM EDT by telephone and verified that I am speaking with the correct person using two identifiers.   I discussed the limitations, risks, security and privacy concerns of performing an evaluation and management service by telephone and the availability of in person appointments. I also discussed with the patient that there may be a patient responsible charge related to this service. The patient expressed understanding and agreed to proceed.   History of Present Illness: [name] is having a tele for hospital follow up. Recently hospitalized for symptomatic anemia from a GI bleed. He remains weak and tired daughter is concern about his blood work. Schedule a lab visit for this Friday.    Observations/Objective: Review of Systems  Gastrointestinal: Positive for diarrhea.       Gas  Psychiatric/Behavioral: Positive for memory loss.  All other systems reviewed and are negative.  Assessment and Plan: Richard [name] was seen today for hospitalization follow-up.  Diagnoses and all orders for this visit:  Anemia due to chronic blood loss -     CBC with Differential/Platelet; Future  Hospital discharge follow-up Hospital recommendation for discharge/ follow up Follow up with PCP in 1-2 weeks Please obtain BMP/CBC in one week   Follow Up Instructions:    I discussed the assessment and treatment plan with the patient. The patient was provided an opportunity to ask questions and all were answered. The patient agreed with the plan and demonstrated an understanding of the instructions.   The patient was advised to call back or seek an in-person evaluation if the symptoms worsen or if the condition fails to improve as anticipated.  I provided 12  minutes of non-face-to-face time during this encounter.  Reviewed hospital encounter, labs, and imaging   [name], NP

## 2019-10-07 ENCOUNTER — Other Ambulatory Visit: Payer: Self-pay

## 2019-10-07 ENCOUNTER — Other Ambulatory Visit (INDEPENDENT_AMBULATORY_CARE_PROVIDER_SITE_OTHER): Payer: Medicare HMO

## 2019-10-07 DIAGNOSIS — D500 Iron deficiency anemia secondary to blood loss (chronic): Secondary | ICD-10-CM | POA: Diagnosis not present

## 2019-10-08 LAB — CMP14+EGFR
ALT: 10 IU/L (ref 0–44)
AST: 20 IU/L (ref 0–40)
Albumin/Globulin Ratio: 1.3 (ref 1.2–2.2)
Albumin: 4.3 g/dL (ref 3.7–4.7)
Alkaline Phosphatase: 122 IU/L — ABNORMAL HIGH (ref 39–117)
BUN/Creatinine Ratio: 12 (ref 10–24)
BUN: 11 mg/dL (ref 8–27)
Bilirubin Total: <0.2 mg/dL (ref 0.0–1.2)
CO2: 24 mmol/L (ref 20–29)
Calcium: 10.3 mg/dL — ABNORMAL HIGH (ref 8.6–10.2)
Chloride: 103 mmol/L (ref 96–106)
Creatinine, Ser: 0.91 mg/dL (ref 0.76–1.27)
GFR calc Af Amer: 94 mL/min/{1.73_m2} (ref >59)
GFR calc non Af Amer: 81 mL/min/{1.73_m2} (ref >59)
Globulin, Total: 3.2 g/dL (ref 1.5–4.5)
Glucose: 96 mg/dL (ref 65–99)
Potassium: 4.4 mmol/L (ref 3.5–5.2)
Sodium: 142 mmol/L (ref 134–144)
Total Protein: 7.5 g/dL (ref 6.0–8.5)

## 2019-10-08 LAB — CBC WITH DIFFERENTIAL/PLATELET
Basophils Absolute: 0 10*3/uL (ref 0.0–0.2)
Basos: 0 %
EOS (ABSOLUTE): 0.1 10*3/uL (ref 0.0–0.4)
Eos: 1 %
Hematocrit: 39.7 % (ref 37.5–51.0)
Hemoglobin: 11.9 g/dL — ABNORMAL LOW (ref 13.0–17.7)
Immature Grans (Abs): 0 10*3/uL (ref 0.0–0.1)
Immature Granulocytes: 0 %
Lymphocytes Absolute: 2.2 10*3/uL (ref 0.7–3.1)
Lymphs: 34 %
MCH: 22.2 pg — ABNORMAL LOW (ref 26.6–33.0)
MCHC: 30 g/dL — ABNORMAL LOW (ref 31.5–35.7)
MCV: 74 fL — ABNORMAL LOW (ref 79–97)
Monocytes Absolute: 0.6 10*3/uL (ref 0.1–0.9)
Monocytes: 9 %
Neutrophils Absolute: 3.6 10*3/uL (ref 1.4–7.0)
Neutrophils: 56 %
Platelets: 438 10*3/uL (ref 150–450)
RBC: 5.35 x10E6/uL (ref 4.14–5.80)
RDW: 17.8 % — ABNORMAL HIGH (ref 11.6–15.4)
WBC: 6.5 10*3/uL (ref 3.4–10.8)

## 2019-10-12 ENCOUNTER — Telehealth (INDEPENDENT_AMBULATORY_CARE_PROVIDER_SITE_OTHER): Payer: Self-pay

## 2019-10-12 NOTE — Telephone Encounter (Signed)
Patients daughter aware that hemoglobin and hematocrit are improving. Nat Christen, CMA

## 2019-10-12 NOTE — Telephone Encounter (Signed)
-----   Message from [name], NP sent at 10/08/2019 11:47 PM EDT ----- Blood work hemoglobin and hematocrit are low but improving. Takes 120 to make RBC 11.9 hospital 8.4 hemoglobin significant improvement

## 2019-10-19 ENCOUNTER — Other Ambulatory Visit: Payer: Self-pay | Admitting: Internal Medicine

## 2019-10-19 DIAGNOSIS — I2699 Other pulmonary embolism without acute cor pulmonale: Secondary | ICD-10-CM

## 2019-10-19 MED FILL — POLYETHYLENE GLYCOL 3350 PO: 17 | 238 days supply | Qty: 238 | Fill #0

## 2019-10-19 MED FILL — STOOL SOFTENER-LAXATIVE TAB: 50-8.6 | 15 days supply | Qty: 30 | Fill #0

## 2019-10-19 MED FILL — XARELTO 20 MG TABLET: 20 | 30 days supply | Qty: 30 | Fill #0

## 2019-11-04 ENCOUNTER — Observation Stay (HOSPITAL_COMMUNITY)
Admission: EM | Admit: 2019-11-04 | Discharge: 2019-11-05 | Disposition: A | Discharge status: Home / Self Care | Payer: Medicare HMO | Attending: Internal Medicine | Admitting: Internal Medicine

## 2019-11-04 ENCOUNTER — Encounter (HOSPITAL_COMMUNITY): Payer: Self-pay | Admitting: Family Medicine

## 2019-11-04 ENCOUNTER — Other Ambulatory Visit: Payer: Self-pay

## 2019-11-04 ENCOUNTER — Emergency Department (HOSPITAL_COMMUNITY): Payer: Medicare HMO

## 2019-11-04 DIAGNOSIS — Z79899 Other long term (current) drug therapy: Secondary | ICD-10-CM | POA: Diagnosis not present

## 2019-11-04 DIAGNOSIS — Z9221 Personal history of antineoplastic chemotherapy: Secondary | ICD-10-CM | POA: Diagnosis not present

## 2019-11-04 DIAGNOSIS — Z86711 Personal history of pulmonary embolism: Secondary | ICD-10-CM | POA: Diagnosis not present

## 2019-11-04 DIAGNOSIS — Z20822 Contact with and (suspected) exposure to covid-19: Secondary | ICD-10-CM | POA: Diagnosis not present

## 2019-11-04 DIAGNOSIS — I10 Essential (primary) hypertension: Secondary | ICD-10-CM | POA: Diagnosis not present

## 2019-11-04 DIAGNOSIS — F0280 Dementia in other diseases classified elsewhere without behavioral disturbance: Secondary | ICD-10-CM | POA: Diagnosis not present

## 2019-11-04 DIAGNOSIS — R55 Syncope and collapse: Principal | ICD-10-CM | POA: Diagnosis present

## 2019-11-04 DIAGNOSIS — M25572 Pain in left ankle and joints of left foot: Secondary | ICD-10-CM | POA: Diagnosis not present

## 2019-11-04 DIAGNOSIS — F0390 Unspecified dementia without behavioral disturbance: Secondary | ICD-10-CM | POA: Diagnosis present

## 2019-11-04 DIAGNOSIS — K5900 Constipation, unspecified: Secondary | ICD-10-CM | POA: Diagnosis not present

## 2019-11-04 DIAGNOSIS — Z87891 Personal history of nicotine dependence: Secondary | ICD-10-CM | POA: Insufficient documentation

## 2019-11-04 DIAGNOSIS — Z85118 Personal history of other malignant neoplasm of bronchus and lung: Secondary | ICD-10-CM | POA: Insufficient documentation

## 2019-11-04 DIAGNOSIS — R52 Pain, unspecified: Secondary | ICD-10-CM | POA: Diagnosis not present

## 2019-11-04 DIAGNOSIS — E43 Unspecified severe protein-calorie malnutrition: Secondary | ICD-10-CM

## 2019-11-04 DIAGNOSIS — G309 Alzheimer's disease, unspecified: Secondary | ICD-10-CM | POA: Diagnosis not present

## 2019-11-04 DIAGNOSIS — I480 Paroxysmal atrial fibrillation: Secondary | ICD-10-CM | POA: Diagnosis not present

## 2019-11-04 DIAGNOSIS — E7800 Pure hypercholesterolemia, unspecified: Secondary | ICD-10-CM | POA: Insufficient documentation

## 2019-11-04 DIAGNOSIS — Z7901 Long term (current) use of anticoagulants: Secondary | ICD-10-CM | POA: Diagnosis not present

## 2019-11-04 DIAGNOSIS — D638 Anemia in other chronic diseases classified elsewhere: Secondary | ICD-10-CM | POA: Insufficient documentation

## 2019-11-04 DIAGNOSIS — D649 Anemia, unspecified: Secondary | ICD-10-CM | POA: Diagnosis present

## 2019-11-04 DIAGNOSIS — E860 Dehydration: Secondary | ICD-10-CM | POA: Diagnosis not present

## 2019-11-04 DIAGNOSIS — S199XXA Unspecified injury of neck, initial encounter: Secondary | ICD-10-CM | POA: Diagnosis not present

## 2019-11-04 DIAGNOSIS — W19XXXA Unspecified fall, initial encounter: Secondary | ICD-10-CM

## 2019-11-04 DIAGNOSIS — Z03818 Encounter for observation for suspected exposure to other biological agents ruled out: Secondary | ICD-10-CM | POA: Diagnosis not present

## 2019-11-04 DIAGNOSIS — S0990XA Unspecified injury of head, initial encounter: Secondary | ICD-10-CM | POA: Diagnosis not present

## 2019-11-04 DIAGNOSIS — R69 Illness, unspecified: Secondary | ICD-10-CM | POA: Diagnosis not present

## 2019-11-04 DIAGNOSIS — Z9181 History of falling: Secondary | ICD-10-CM | POA: Diagnosis not present

## 2019-11-04 DIAGNOSIS — C3490 Malignant neoplasm of unspecified part of unspecified bronchus or lung: Secondary | ICD-10-CM | POA: Diagnosis not present

## 2019-11-04 DIAGNOSIS — C3491 Malignant neoplasm of unspecified part of right bronchus or lung: Secondary | ICD-10-CM | POA: Diagnosis present

## 2019-11-04 DIAGNOSIS — R000 Tachycardia, unspecified: Secondary | ICD-10-CM | POA: Diagnosis not present

## 2019-11-04 DIAGNOSIS — S299XXA Unspecified injury of thorax, initial encounter: Secondary | ICD-10-CM | POA: Diagnosis not present

## 2019-11-04 DIAGNOSIS — R918 Other nonspecific abnormal finding of lung field: Secondary | ICD-10-CM | POA: Diagnosis present

## 2019-11-04 DIAGNOSIS — M25551 Pain in right hip: Secondary | ICD-10-CM | POA: Diagnosis not present

## 2019-11-04 DIAGNOSIS — Y92009 Unspecified place in unspecified non-institutional (private) residence as the place of occurrence of the external cause: Secondary | ICD-10-CM

## 2019-11-04 HISTORY — DX: Unspecified atrial fibrillation: I48.91

## 2019-11-04 LAB — CBC WITH DIFFERENTIAL/PLATELET
Abs Immature Granulocytes: 0.03 10*3/uL (ref 0.00–0.07)
Basophils Absolute: 0 10*3/uL (ref 0.0–0.1)
Basophils Relative: 0 %
Eosinophils Absolute: 0 10*3/uL (ref 0.0–0.5)
Eosinophils Relative: 0 %
HCT: 30.3 % — ABNORMAL LOW (ref 39.0–52.0)
Hemoglobin: 9.5 g/dL — ABNORMAL LOW (ref 13.0–17.0)
Immature Granulocytes: 0 %
Lymphocytes Relative: 11 %
Lymphs Abs: 1.1 10*3/uL (ref 0.7–4.0)
MCH: 22.1 pg — ABNORMAL LOW (ref 26.0–34.0)
MCHC: 31.4 g/dL (ref 30.0–36.0)
MCV: 70.5 fL — ABNORMAL LOW (ref 80.0–100.0)
Monocytes Absolute: 0.9 10*3/uL (ref 0.1–1.0)
Monocytes Relative: 9 %
Neutro Abs: 8.1 10*3/uL — ABNORMAL HIGH (ref 1.7–7.7)
Neutrophils Relative %: 80 %
Platelets: 273 10*3/uL (ref 150–400)
RBC: 4.3 MIL/uL (ref 4.22–5.81)
RDW: 19.6 % — ABNORMAL HIGH (ref 11.5–15.5)
WBC: 10.2 10*3/uL (ref 4.0–10.5)
nRBC: 0.2 % (ref 0.0–0.2)

## 2019-11-04 LAB — COMPREHENSIVE METABOLIC PANEL
ALT: 15 U/L (ref 0–44)
AST: 20 U/L (ref 15–41)
Albumin: 3.5 g/dL (ref 3.5–5.0)
Alkaline Phosphatase: 66 U/L (ref 38–126)
Anion gap: 10 (ref 5–15)
BUN: 26 mg/dL — ABNORMAL HIGH (ref 8–23)
CO2: 25 mmol/L (ref 22–32)
Calcium: 9.4 mg/dL (ref 8.9–10.3)
Chloride: 110 mmol/L (ref 98–111)
Creatinine, Ser: 0.81 mg/dL (ref 0.61–1.24)
GFR calc Af Amer: >60 mL/min (ref >60)
GFR calc non Af Amer: >60 mL/min (ref >60)
Glucose, Bld: 117 mg/dL — ABNORMAL HIGH (ref 70–99)
Potassium: 4.2 mmol/L (ref 3.5–5.1)
Sodium: 145 mmol/L (ref 135–145)
Total Bilirubin: 0.3 mg/dL (ref 0.3–1.2)
Total Protein: 6.5 g/dL (ref 6.5–8.1)

## 2019-11-04 LAB — PROTIME-INR
INR: 1.3 — ABNORMAL HIGH (ref 0.8–1.2)
Prothrombin Time: 15.3 seconds — ABNORMAL HIGH (ref 11.4–15.2)

## 2019-11-04 LAB — APTT: aPTT: 35 seconds (ref 24–36)

## 2019-11-04 MED ORDER — SODIUM CHLORIDE 0.9 % IV SOLN: INTRAVENOUS | Status: AC

## 2019-11-04 NOTE — H&P (Signed)
[facility] History and Physical  Kolbi Altadonna XBM:[id number] DOB: 28-Dec-1940 DOA: 11/04/2019  Referring EDP: Irene Pap PCP: [name], NP   Chief Complaint: Syncope and Fall   HPI: Richard [name] is a 79 y.o. male with PMH of stage IIIa non-small cell lung CA, hypertension, hyperlipidemia, dementia, paroxysmal Afib on Xarelto who presented to ED after syncope/fall and admitted for observation.  History limited due to patient's dementia. Most of hx per chart and EDP. Patient presented via EMS from home due to syncope and subsequent fall. Per EDP, daughter reported that patient was sitting in a chair and fell over and was initially unresponsive. Patient is now at baseline and denies complaints other than back pain. Denies headache, dizziness, fever, chills, cough, SOB, chest pain, abdominal pain, nausea, vomiting, diarrhea, constipation, difficulty moving arms/legs, speech difficulty, trouble eating or any other complaints.  Addendum: Daughter called back. Reports that patient lives with her. He was outside on the porch sitting on the steps and his eyes got big and he appeared to pass out and fell over. Reports that for the last few days he has been more confused. Unaware of dark stools. Reports possible constipation.   In the ED: Mild tachycardia and tachypnea otherwise vitals stable on room air. Labs remarkable for Hgb 9.5, WBC 10.2 and CMP WNL. UA pending collection. CT Head and C-spine without acute findings. ED requested admission as they did not feel comfortable with the patient going home.   Review of Systems:  All other systems negative unless noted above in HPI. Patient denies complaints other than back pain during full ROS although may be unreliable.   Past Medical History:  Diagnosis Date  . Acute pulmonary embolism ([place]) 01/30/2019  . Acute respiratory failure with hypoxia ([place]) 01/30/2019  . AKI (acute kidney injury) ([place]) 01/30/2019  . Atrial fibrillation with RVR ([place])     . Dementia ([place])   . High cholesterol   . Hypertension   . Lung mass 12/2018  . Severe sepsis ([place]) 01/30/2019   Past Surgical History:  Procedure Laterality Date  . IR GASTROSTOMY TUBE MOD SED  05/25/2019  . IR GASTROSTOMY TUBE REMOVAL  09/08/2019  . JOINT REPLACEMENT    . VIDEO BRONCHOSCOPY WITH ENDOBRONCHIAL ULTRASOUND N/A 12/16/2018   Procedure: VIDEO BRONCHOSCOPY WITH ENDOBRONCHIAL ULTRASOUND AND FLUROSCOPY;  Surgeon: [name], MD;  Location: [facility];  Service: Pulmonary;  Laterality: N/A;   Social History:  reports that he has quit smoking. His smoking use included cigarettes. He has a 60.00 pack-year smoking history. He has never used smokeless tobacco. He reports current alcohol use. He reports that he does not use drugs.  No Known Allergies  Family History  Problem Relation Age of Onset  . Stomach cancer Mother   . COPD Father   . Lung cancer Brother   . Lung cancer Brother     Prior to Admission medications   Medication Sig Start Date End Date Taking? Authorizing Provider  ferrous sulfate 325 (65 FE) MG tablet Take 1 tablet (325 mg total) by mouth daily with breakfast. 09/27/19  Yes Regalado, Belkys A, MD  Multiple Vitamin (MULTIVITAMIN) tablet Take 1 tablet by mouth daily.   Yes [provider]  pantoprazole (PROTONIX) 40 MG tablet Take 1 tablet (40 mg total) by mouth daily. 09/28/19  Yes Regalado, Belkys A, MD  polyethylene glycol (MIRALAX / GLYCOLAX) 17 g packet Take 17 g by mouth 2 (two) times daily. 09/27/19  Yes Regalado, Cassie Freer, MD  senna-docusate (  SENOKOT-S) 8.6-50 MG tablet Take 1 tablet by mouth 2 (two) times daily. 09/27/19  Yes Regalado, Belkys A, MD  XARELTO 20 MG TABS tablet TAKE 1 TABLET BY MOUTH DAILY WITH SUPPER Patient taking differently: Take 20 mg by mouth every evening.  10/19/19  Yes Curt Bears, MD   Physical Exam: Vitals:   11/04/19 1945 11/04/19 2030 11/04/19 2115 11/04/19 2119  BP: 116/85 108/68 117/70 117/70  Pulse:    98   Resp: 18 (!) 35 (!) 34 (!) 33  Temp:      TempSrc:      SpO2:    99%  Weight:      Height:        Wt Readings from Last 3 Encounters:  11/04/19 74.8 kg  09/27/19 69.4 kg  09/09/19 67.5 kg    . General:  Appears calm and comfortable. Alert. Oriented to person only; at baseline.  . Eyes: EOMI, normal lids, irises & conjunctiva . ENT: grossly normal hearing, lips & tongue . Neck: normal ROM . Cardiovascular: Tachycardic with regular rhythm, no m/r/g. No LE edema. Marland Kitchen Respiratory: CTA bilaterally, no w/r/r. Normal respiratory effort. . Abdomen: soft, ntnd . Skin: no rash or induration seen on limited exam . Musculoskeletal: grossly normal tone BUE/BLE . Psychiatric: grossly normal mood and affect, speech difficult to understand  . Neurologic: grossly non-focal.          Labs on Admission:  Basic Metabolic Panel: Recent Labs  Lab 11/04/19 1843  NA 145  K 4.2  CL 110  CO2 25  GLUCOSE 117*  BUN 26*  CREATININE 0.81  CALCIUM 9.4   Liver Function Tests: Recent Labs  Lab 11/04/19 1843  AST 20  ALT 15  ALKPHOS 66  BILITOT 0.3  PROT 6.5  ALBUMIN 3.5   No results for input(s): LIPASE, AMYLASE in the last 168 hours. No results for input(s): AMMONIA in the last 168 hours. CBC: Recent Labs  Lab 11/04/19 1843  WBC 10.2  NEUTROABS 8.1*  HGB 9.5*  HCT 30.3*  MCV 70.5*  PLT 273   Cardiac Enzymes: No results for input(s): CKTOTAL, CKMB, CKMBINDEX, TROPONINI in the last 168 hours.  BNP (last 3 results) No results for input(s): BNP in the last 8760 hours.  ProBNP (last 3 results) No results for input(s): PROBNP in the last 8760 hours.  CBG: No results for input(s): GLUCAP in the last 168 hours.  Radiological Exams on Admission: CT Head Wo Contrast  Result Date: 11/04/2019 CLINICAL DATA:  Syncope, fell in a bush, loss of consciousness, unknown if before after the incident. On Xarelto. EXAM: CT HEAD WITHOUT CONTRAST CT CERVICAL SPINE WITHOUT CONTRAST TECHNIQUE:  Multidetector CT imaging of the head and cervical spine was performed following the standard protocol without intravenous contrast. Multiplanar CT image reconstructions of the cervical spine were also generated. 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 Redemonstration of the advanced parenchymal volume loss and atrophy with marked ventriculomegaly which is unchanged from prior exam. No evidence of acute infarction, hemorrhage, hydrocephalus, extra-axial collection or mass lesion/mass effect. Patchy areas of white matter hypoattenuation are most compatible with chronic microvascular angiopathy. Vascular: Atherosclerotic calcification of the carotid siphons. No hyperdense vessel. Skull: Mild frontal scalp thickening, no subjacent calvarial fracture or acute osseous injury. Chronic deformity of the nasal bones. Sinuses/Orbits: Paranasal sinuses and mastoid air cells are predominantly clear. Included orbital structures are unremarkable.  Other: None CT CERVICAL SPINE FINDINGS Alignment: Stabilization collar in place at the time of exam. Slight straightening of the normal cervical lordosis. Mild retrolisthesis of C5 on C6 is likely on a spondylitic basis. No evidence of traumatic listhesis. No abnormally widened, perched or jumped facets. Normal alignment of the craniocervical and atlantoaxial articulations. Skull base and vertebrae: No acute fracture. No primary bone lesion or focal pathologic process. Soft tissues and spinal canal: No pre or paravertebral fluid or swelling. No visible canal hematoma. Disc levels: Multilevel intervertebral disc height loss with spondylitic endplate changes. Advanced uncinate spurring and hypertrophic facet degenerative changes are present as well. Posterior disc osteophyte complexes result in multilevel moderate canal stenosis C3-C6 and mild canal stenosis C6-7.  Additional multilevel mild-to-moderate neural foraminal narrowing is seen bilaterally. Upper chest: Apical emphysematous changes. No acute abnormality in the upper chest or imaged lung apices. Other: Normal thyroid. IMPRESSION: 1. No acute intracranial abnormality. 2. 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 3. Stable advanced parenchymal volume loss and atrophy with marked ventriculomegaly. 4. Mild right frontal scalp thickening, no subjacent calvarial fracture or acute osseous injury. 5. No evidence of acute fracture or traumatic listhesis of the cervical spine. 6. Multilevel degenerative disc disease and facet degenerative changes of the cervical spine as described. Electronically Signed   By: Lovena Le M.D.   On: 11/04/2019 19:23   CT Cervical Spine Wo Contrast  Result Date: 11/04/2019 CLINICAL DATA:  Syncope, fell in a bush, loss of consciousness, unknown if before after the incident. On Xarelto. EXAM: CT HEAD WITHOUT CONTRAST CT CERVICAL SPINE WITHOUT CONTRAST TECHNIQUE: Multidetector CT imaging of the head and cervical spine was performed following the standard protocol without intravenous contrast. Multiplanar CT image reconstructions of the cervical spine were also generated. 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 Redemonstration of the advanced parenchymal volume loss and atrophy with marked ventriculomegaly which is unchanged from prior exam. No evidence of acute infarction, hemorrhage, hydrocephalus, extra-axial collection or mass lesion/mass effect. Patchy areas of white matter hypoattenuation are most compatible with chronic microvascular angiopathy. Vascular: Atherosclerotic calcification of the carotid siphons. No hyperdense vessel. Skull: Mild frontal scalp thickening, no subjacent calvarial fracture or acute osseous injury.  Chronic deformity of the nasal bones. Sinuses/Orbits: Paranasal sinuses and mastoid air cells are predominantly clear. Included orbital structures are unremarkable. Other: None CT CERVICAL SPINE FINDINGS Alignment: Stabilization collar in place at the time of exam. Slight straightening of the normal cervical lordosis. Mild retrolisthesis of C5 on C6 is likely on a spondylitic basis. No evidence of traumatic listhesis. No abnormally widened, perched or jumped facets. Normal alignment of the craniocervical and atlantoaxial articulations. Skull base and vertebrae: No acute fracture. No primary bone lesion or focal pathologic process. Soft tissues and spinal canal: No pre or paravertebral fluid or swelling. No visible canal hematoma. Disc levels: Multilevel intervertebral disc height loss with spondylitic endplate changes. Advanced uncinate spurring and hypertrophic facet degenerative changes are present as well. Posterior disc osteophyte complexes result in multilevel moderate canal stenosis C3-C6 and mild canal stenosis C6-7. Additional multilevel mild-to-moderate neural foraminal narrowing is seen bilaterally. Upper chest: Apical emphysematous changes. No acute abnormality in the upper chest or imaged lung apices. Other: Normal thyroid. IMPRESSION: 1. No acute intracranial abnormality. 2. 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 3. Stable advanced  parenchymal volume loss and atrophy with marked ventriculomegaly. 4. Mild right frontal scalp thickening, no subjacent calvarial fracture or acute osseous injury. 5. No evidence of acute fracture or traumatic listhesis of the cervical spine. 6. Multilevel degenerative disc disease and facet degenerative changes of the cervical spine as described. Electronically Signed   By: Lovena Le M.D.   On: 11/04/2019 19:23   DG Chest Portable 1 View  Result Date: 11/04/2019 CLINICAL DATA:  Syncope, fell, known lung cancer EXAM:  PORTABLE CHEST 1 VIEW COMPARISON:  08/31/2019, 09/24/2019 FINDINGS: Single frontal view of the chest demonstrates a stable cardiac silhouette. There is persistent right basilar volume loss and consolidation. Trace right pleural effusion. No pneumothorax. Left chest is clear. IMPRESSION: 1. Stable volume loss right hemithorax, with right basilar consolidation and small effusion. These findings could be related to patient's known malignancy and prior treatment, though superimposed infection cannot be excluded. 2. No evidence of acute trauma. Electronically Signed   By: Randa Ngo M.D.   On: 11/04/2019 23:10    EKG: Independently reviewed. HR 100. Sinus tachycardia. QTc 426. No STEMI.  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 male with PMH of stage IIIa non-small cell lung CA, hypertension, hyperlipidemia, dementia, paroxysmal Afib on Xarelto who presented to ED after syncope/fall and admitted for observation.  Syncope/Fall -unclear etiology due to poor history -Tele -will obtain Echo, last Echo in Aug 2020 largely WNL -obtain Hemoccult  -PT/OT -could be worsening of chronic condition due to weakness or anemia; consider Palliative consult; briefly discussed this with daughter and agreeable to talk with them -Orthostatic Vitals signs -Gentle IVF for next 8 hours  Symptomatic anemia -Hgb 9.5 on admission; previously 11.9 -GI consulted during last admission for GIB and patient not a good candidate for colonoscopy; conservative management  -Hold Xarelto for now  -Obtain Hemoccult -Repeat CBC in AM  Constipation -Cont bowel regimen; possibly no BM for a few days per daughter  Paroxysmal atrial fibrillation -Rate controlled, not on beta-blocker -okay to resume  Xarelto per GI on last admission; consider restarting if AM CBC stable   History of NSCLC stage IIIa -Follows with Dr. Julien Nordmann, no acute issues  Alzheimer's dementia -Seems to be at baseline, no acute issues, delirium precautions  History of PE Paroxysmal A fib  -Hold Xarelto for now; consider restarting in AM if CBC stable   Severe protein calorie malnutrition -Nutrition consulted during last admission -Weight up-trending  -Regular diet Wt Readings from Last 3 Encounters:  11/04/19 74.8 kg  09/27/19 69.4 kg  09/09/19 67.5 kg   Code Status: Full (presumed as patient unable to answer and daughter did not answer call - patient was a Full Code during last admission in April 2021) DVT Prophylaxis: SCDs Family Communication: Spoke with daughter [name] by phone.  Disposition Plan: OBS. Patient is at high risk for further decompensation due to age and co-morbidities. Will obtain PT/OT and monitor labs/clinical status. Possible discharge home tomorrow.    Time spent: 70 minutes  Chauncey Mann, MD [facility] Pager [phone]

## 2019-11-04 NOTE — ED Notes (Signed)
In and out cath unsuccessful. Provider made aware

## 2019-11-04 NOTE — ED Triage Notes (Signed)
Pt arrived via [facility] from home CC possible syncope episode that led to a fall into a bush. Pt family reports LOC but unclear if that was before or after the fall. Pt is on blood thinner Xerolto.  Pt denies head neck or back pain. Pt arrived with abrasions on right for arm.   Per EMS ambulatory with assistance and A/OX1 baseline.  20G left for arm 284ml NS   Hx dementia, afib, lung cancer

## 2019-11-04 NOTE — ED Provider Notes (Signed)
[facility] DEPT Provider Note   CSN: [id number] Arrival date & time: 11/04/19  1817     History No chief complaint on file.   Richard [name] is a 79 y.o. male.  79 y.o male with a PMH of HTN, Dementia, Lung mass presents to the ED via EMS status post syncopal episode x prior to arrival. Level 5 caveat due to dementia.   The history is provided by the patient.       Past Medical History:  Diagnosis Date   Dementia ([place])    High cholesterol    Hypertension    Lung mass 12/2018    Patient Active Problem List   Diagnosis Date Noted   Anemia 09/25/2019   Symptomatic anemia 09/24/2019   Paroxysmal atrial fibrillation ([place]) 07/06/2019   Pressure injury of skin 05/21/2019   Anorexia 05/20/2019   HCAP (healthcare-associated pneumonia) 05/18/2019   Protein-calorie malnutrition, severe ([place]) 05/18/2019   Sepsis ([place]) 05/08/2019   History of pulmonary embolism 03/30/2019   Pure hypercholesterolemia 03/30/2019   SOB (shortness of breath) 03/30/2019   Atrial fibrillation with RVR ([place])    Palliative care by specialist    DNR (do not resuscitate) discussion    Severe sepsis ([place]) 01/30/2019   Acute pulmonary embolism ([place]) 01/30/2019   Acute respiratory failure with hypoxia ([place]) 01/30/2019   AKI (acute kidney injury) ([place]) 01/30/2019   Poor appetite 01/05/2019   Goals of care, counseling/discussion 12/28/2018   Encounter for antineoplastic chemotherapy 12/28/2018   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   Dementia without behavioral disturbance ([place]) 12/16/2018   Lung mass 12/15/2018   Pelvic mass in male 12/15/2018   Postobstructive pneumonia 12/15/2018   Hypertension     Past Surgical History:  Procedure Laterality Date   IR GASTROSTOMY TUBE MOD SED  05/25/2019   IR GASTROSTOMY TUBE REMOVAL  09/08/2019   JOINT REPLACEMENT     VIDEO BRONCHOSCOPY WITH ENDOBRONCHIAL ULTRASOUND N/A  12/16/2018   Procedure: VIDEO BRONCHOSCOPY WITH ENDOBRONCHIAL ULTRASOUND AND FLUROSCOPY;  Surgeon: [name], MD;  Location: [facility] OR;  Service: Pulmonary;  Laterality: N/A;       Family History  Problem Relation Age of Onset   Stomach cancer Mother    COPD Father    Lung cancer Brother    Lung cancer Brother     Social History   Tobacco Use   Smoking status: Former Smoker    Packs/day: 1.00    Years: 60.00    Pack years: 60.00    Types: Cigarettes   Smokeless tobacco: Never Used  Substance Use Topics   Alcohol use: Yes    Comment: occasional   Drug use: No    Home Medications Prior to Admission medications   Medication Sig Start Date End Date Taking? Authorizing Provider  ferrous sulfate 325 (65 FE) MG tablet Take 1 tablet (325 mg total) by mouth daily with breakfast. 09/27/19  Yes Regalado, Belkys A, MD  Multiple Vitamin (MULTIVITAMIN) tablet Take 1 tablet by mouth daily.   Yes [provider]  pantoprazole (PROTONIX) 40 MG tablet Take 1 tablet (40 mg total) by mouth daily. 09/28/19  Yes Regalado, Belkys A, MD  polyethylene glycol (MIRALAX / GLYCOLAX) 17 g packet Take 17 g by mouth 2 (two) times daily. 09/27/19  Yes Regalado, Belkys A, MD  senna-docusate (SENOKOT-S) 8.6-50 MG tablet Take 1 tablet by mouth 2 (two) times daily. 09/27/19  Yes Regalado, Cassie Freer, MD  XARELTO 20 MG  TABS tablet TAKE 1 TABLET BY MOUTH DAILY WITH SUPPER Patient taking differently: Take 20 mg by mouth every evening.  10/19/19  Yes Curt Bears, MD    Allergies    Patient has no known allergies.  Review of Systems   Review of Systems  Unable to perform ROS: Dementia    Physical Exam Updated Vital Signs BP 117/70    Pulse 98    Temp 97.8 F (36.6 C)    Resp (!) 33    Ht 6' 3.5" (1.918 m)    Wt 74.8 kg    SpO2 99%    BMI 20.35 kg/m   Physical Exam Vitals and nursing note reviewed.  Constitutional:      Appearance: Normal appearance.  HENT:     Head: Normocephalic.       Comments: Superficial abrasion to the right temporal region with hematoma present.     Nose: Nose normal.     Mouth/Throat:     Mouth: Mucous membranes are moist.  Eyes:     Pupils: Pupils are equal, round, and reactive to light.  Neck:     Comments: Aspen C collar in place.  Cardiovascular:     Rate and Rhythm: Tachycardia present.  Pulmonary:     Effort: Pulmonary effort is normal.     Breath sounds: No wheezing or rales.  Abdominal:     General: Abdomen is flat.     Tenderness: There is no abdominal tenderness. There is no right CVA tenderness or left CVA tenderness.     Comments: No visible bruising or hematoma.   Skin:    General: Skin is warm and dry.  Neurological:     Mental Status: He is alert. Mental status is at baseline.     Comments: Confirmed mental status with family.      ED Results / Procedures / Treatments   Labs (all labs ordered are listed, but only abnormal results are displayed) Labs Reviewed  CBC WITH DIFFERENTIAL/PLATELET - Abnormal; Notable for the following components:      Result Value   Hemoglobin 9.5 (*)    HCT 30.3 (*)    MCV 70.5 (*)    MCH 22.1 (*)    RDW 19.6 (*)    Neutro Abs 8.1 (*)    All other components within normal limits  COMPREHENSIVE METABOLIC PANEL - Abnormal; Notable for the following components:   Glucose, Bld 117 (*)    BUN 26 (*)    All other components within normal limits  SARS CORONAVIRUS 2 BY RT PCR (HOSPITAL ORDER, [facility])  URINALYSIS, ROUTINE W REFLEX MICROSCOPIC    EKG EKG Interpretation  Date/Time:  Friday Nov 04 2019 18:58:00 EDT Ventricular Rate:  101 PR Interval:    QRS Duration: 79 QT Interval:  328 QTC Calculation: 426 R Axis:   78 Text Interpretation: Sinus tachycardia Probable left atrial enlargement Confirmed by Lacretia Leigh ([id number]) on 11/04/2019 10:06:27 PM   Radiology CT Head Wo Contrast  Result Date: 11/04/2019 CLINICAL DATA:  Syncope, fell in a bush,  loss of consciousness, unknown if before after the incident. On Xarelto. EXAM: CT HEAD WITHOUT CONTRAST CT CERVICAL SPINE WITHOUT CONTRAST TECHNIQUE: Multidetector CT imaging of the head and cervical spine was performed following the standard protocol without intravenous contrast. Multiplanar CT image reconstructions of the cervical spine were also generated. 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 Redemonstration of the advanced parenchymal volume loss and atrophy with marked ventriculomegaly which is unchanged from prior exam. No evidence of acute infarction, hemorrhage, hydrocephalus, extra-axial collection or mass lesion/mass effect. Patchy areas of white matter hypoattenuation are most compatible with chronic microvascular angiopathy. Vascular: Atherosclerotic calcification of the carotid siphons. No hyperdense vessel. Skull: Mild frontal scalp thickening, no subjacent calvarial fracture or acute osseous injury. Chronic deformity of the nasal bones. Sinuses/Orbits: Paranasal sinuses and mastoid air cells are predominantly clear. Included orbital structures are unremarkable. Other: None CT CERVICAL SPINE FINDINGS Alignment: Stabilization collar in place at the time of exam. Slight straightening of the normal cervical lordosis. Mild retrolisthesis of C5 on C6 is likely on a spondylitic basis. No evidence of traumatic listhesis. No abnormally widened, perched or jumped facets. Normal alignment of the craniocervical and atlantoaxial articulations. Skull base and vertebrae: No acute fracture. No primary bone lesion or focal pathologic process. Soft tissues and spinal canal: No pre or paravertebral fluid or swelling. No visible canal hematoma. Disc levels: Multilevel intervertebral disc height loss with spondylitic endplate changes. Advanced uncinate spurring and hypertrophic facet degenerative  changes are present as well. Posterior disc osteophyte complexes result in multilevel moderate canal stenosis C3-C6 and mild canal stenosis C6-7. Additional multilevel mild-to-moderate neural foraminal narrowing is seen bilaterally. Upper chest: Apical emphysematous changes. No acute abnormality in the upper chest or imaged lung apices. Other: Normal thyroid. IMPRESSION: 1. No acute intracranial abnormality. 2. 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 3. Stable advanced parenchymal volume loss and atrophy with marked ventriculomegaly. 4. Mild right frontal scalp thickening, no subjacent calvarial fracture or acute osseous injury. 5. No evidence of acute fracture or traumatic listhesis of the cervical spine. 6. Multilevel degenerative disc disease and facet degenerative changes of the cervical spine as described. Electronically Signed   By: Lovena Le M.D.   On: 11/04/2019 19:23   CT Cervical Spine Wo Contrast  Result Date: 11/04/2019 CLINICAL DATA:  Syncope, fell in a bush, loss of consciousness, unknown if before after the incident. On Xarelto. EXAM: CT HEAD WITHOUT CONTRAST CT CERVICAL SPINE WITHOUT CONTRAST TECHNIQUE: Multidetector CT imaging of the head and cervical spine was performed following the standard protocol without intravenous contrast. Multiplanar CT image reconstructions of the cervical spine were also generated. 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 Redemonstration of the advanced parenchymal volume loss and atrophy with marked ventriculomegaly which is unchanged from prior exam. No evidence of acute infarction, hemorrhage, hydrocephalus, extra-axial collection or mass lesion/mass effect. Patchy areas of white matter hypoattenuation are most compatible with chronic microvascular angiopathy. Vascular: Atherosclerotic  calcification of the carotid siphons. No hyperdense vessel. Skull: Mild frontal scalp thickening, no subjacent calvarial fracture or acute osseous injury. Chronic deformity of the nasal bones. Sinuses/Orbits: Paranasal sinuses and mastoid air cells are predominantly clear. Included orbital structures are unremarkable. Other: None CT CERVICAL SPINE FINDINGS Alignment: Stabilization collar in place at the time of exam. Slight straightening of the normal cervical lordosis. Mild retrolisthesis of C5 on C6 is likely on a spondylitic basis. No evidence of traumatic listhesis. No abnormally widened, perched or jumped facets. Normal alignment of the craniocervical and atlantoaxial articulations. Skull base and vertebrae: No acute fracture. No primary bone lesion or focal pathologic process. Soft tissues and spinal canal: No pre or paravertebral fluid or swelling. No visible  canal hematoma. Disc levels: Multilevel intervertebral disc height loss with spondylitic endplate changes. Advanced uncinate spurring and hypertrophic facet degenerative changes are present as well. Posterior disc osteophyte complexes result in multilevel moderate canal stenosis C3-C6 and mild canal stenosis C6-7. Additional multilevel mild-to-moderate neural foraminal narrowing is seen bilaterally. Upper chest: Apical emphysematous changes. No acute abnormality in the upper chest or imaged lung apices. Other: Normal thyroid. IMPRESSION: 1. No acute intracranial abnormality. 2. 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 3. Stable advanced parenchymal volume loss and atrophy with marked ventriculomegaly. 4. Mild right frontal scalp thickening, no subjacent calvarial fracture or acute osseous injury. 5. No evidence of acute fracture or traumatic listhesis of the cervical spine. 6. Multilevel degenerative disc disease and facet degenerative changes of the cervical spine as described. Electronically Signed   By:  Lovena Le M.D.   On: 11/04/2019 19:23    Procedures Procedures (including critical care time)  Medications Ordered in ED Medications - No data to display  ED Course  I have reviewed the triage vital signs and the nursing notes.  Pertinent labs & imaging results that were available during my care of the patient were reviewed by me and considered in my medical decision making (see chart for details).    MDM Rules/Calculators/A&P  Patient with a past medical history of dementia, A. fib presents to the ED status post syncope and fall.  Patient does report no pain at this time states "I am not providing any history ", he does have a history of dementia at baseline, will call family members in order to obtain collateral information.  During primary assessment patient is placed on [place] c-collar by EMS, is able to answer questions, is alert and oriented x2, does not have any chest pain, shortness of breath that he reports or headache.However, history is limited as he does have a history of dementia.  6:45 PM Spoke to patient's daughter [name], he was in his apt and he is wobbly and fell over on the side in the bushes, his eyes were big, wasn't responding. He fell over to the side, unsure how this occurred. No recent falls aside from today. He did complaint of right arm pain. She thinks he is "dehydrated", has not been doing good since he came home from hospital. "Last couple of days he has been looking kind of down", appears weaker than at his baseline.  Will obtain screening labs, EKG in order to further evaluate patient's syncopal episode.  CT Head/Cervical spine showed:  1. No acute intracranial abnormality.  2. Stable regions of remote infarct in the right occipital and  posterior parietal lobe.  3. Stable advanced parenchymal volume loss and atrophy with marked  ventriculomegaly.  4. Mild right frontal scalp thickening, no subjacent calvarial  fracture or acute osseous injury.  5. No  evidence of acute fracture or traumatic listhesis of the  cervical spine.  6. Multilevel degenerative disc disease and facet degenerative  changes of the cervical spine as described.     Interpretation of his labs by me, reveal a CBC of 9.5, decreased from his previous labs 4 weeks ago however within his baseline.  CMP without any electrolyte abnormality, creatinine level is within normal limits.  LFTs are unremarkable.  We attempted UA but there was unable to obtain urine and blood was present.  A bladder scan was done which is currently pending.   10:47 PM spoke to Dr. Marice Potter  who will  admit patient for further evaluation of syncopal episode.  We discussed that patient had a recent admission for similar presentation, will also update family daughter [name]. Call placed to daughter evelyn without successful communication.   Portions of this note were generated with Lobbyist. Dictation errors may occur despite best attempts at proofreading.  Final Clinical Impression(s) / ED Diagnoses Final diagnoses:  Syncope and collapse    Rx / DC Orders ED Discharge Orders    None       Janeece Fitting, Hershal Coria 11/04/19 2250    Lacretia Leigh, MD 11/07/19 1104

## 2019-11-04 NOTE — ED Notes (Signed)
Pt was not able to complete orthostatic VS. While Standing up, Pt started shaking and HR went up to the 140's

## 2019-11-04 NOTE — ED Notes (Signed)
Unable to bladder scan pt due to scanner malfunction. Secretary and provider made aware

## 2019-11-04 NOTE — ED Provider Notes (Signed)
Medical screening examination/treatment/procedure(s) were conducted as a shared visit with non-physician practitioner(s) and myself.  I personally evaluated the patient during the encounter.  EKG Interpretation  Date/Time:  Friday Nov 04 2019 18:58:00 EDT Ventricular Rate:  101 PR Interval:    QRS Duration: 79 QT Interval:  328 QTC Calculation: 426 R Axis:   78 Text Interpretation: Sinus tachycardia Probable left atrial enlargement Confirmed by Lacretia Leigh ([id number]) on 11/04/2019 10:93:56 PM 79 year old male here after syncopal event that was witnessed by relative.  Work-up here is suffered noncontributory.  Will admit for observation   Lacretia Leigh, MD 11/04/19 2223

## 2019-11-05 ENCOUNTER — Observation Stay (HOSPITAL_BASED_OUTPATIENT_CLINIC_OR_DEPARTMENT_OTHER): Payer: Medicare HMO

## 2019-11-05 ENCOUNTER — Encounter (HOSPITAL_COMMUNITY): Payer: Self-pay | Admitting: Family Medicine

## 2019-11-05 DIAGNOSIS — I340 Nonrheumatic mitral (valve) insufficiency: Secondary | ICD-10-CM

## 2019-11-05 DIAGNOSIS — R55 Syncope and collapse: Secondary | ICD-10-CM

## 2019-11-05 LAB — ECHOCARDIOGRAM LIMITED
Height: 72 in
Weight: 2440.93 oz

## 2019-11-05 LAB — MAGNESIUM: Magnesium: 1.9 mg/dL (ref 1.7–2.4)

## 2019-11-05 LAB — BASIC METABOLIC PANEL
Anion gap: 4 — ABNORMAL LOW (ref 5–15)
BUN: 21 mg/dL (ref 8–23)
CO2: 25 mmol/L (ref 22–32)
Calcium: 8.9 mg/dL (ref 8.9–10.3)
Chloride: 110 mmol/L (ref 98–111)
Creatinine, Ser: 0.61 mg/dL (ref 0.61–1.24)
GFR calc Af Amer: >60 mL/min (ref >60)
GFR calc non Af Amer: >60 mL/min (ref >60)
Glucose, Bld: 97 mg/dL (ref 70–99)
Potassium: 3.8 mmol/L (ref 3.5–5.1)
Sodium: 139 mmol/L (ref 135–145)

## 2019-11-05 LAB — CBC
HCT: 27.4 % — ABNORMAL LOW (ref 39.0–52.0)
Hemoglobin: 8.9 g/dL — ABNORMAL LOW (ref 13.0–17.0)
MCH: 22.6 pg — ABNORMAL LOW (ref 26.0–34.0)
MCHC: 32.5 g/dL (ref 30.0–36.0)
MCV: 69.7 fL — ABNORMAL LOW (ref 80.0–100.0)
Platelets: 240 10*3/uL (ref 150–400)
RBC: 3.93 MIL/uL — ABNORMAL LOW (ref 4.22–5.81)
RDW: 19.1 % — ABNORMAL HIGH (ref 11.5–15.5)
WBC: 9 10*3/uL (ref 4.0–10.5)
nRBC: 0 % (ref 0.0–0.2)

## 2019-11-05 LAB — HEMOGLOBIN AND HEMATOCRIT, BLOOD
HCT: 27.9 % — ABNORMAL LOW (ref 39.0–52.0)
Hemoglobin: 8.9 g/dL — ABNORMAL LOW (ref 13.0–17.0)

## 2019-11-05 LAB — SARS CORONAVIRUS 2 BY RT PCR (HOSPITAL ORDER, PERFORMED IN ~~LOC~~ HOSPITAL LAB): SARS Coronavirus 2: NEGATIVE

## 2019-11-05 MED ORDER — DULCOLAX 5 MG PO TBEC
5.0000 mg | DELAYED_RELEASE_TABLET | Freq: Every day | ORAL | 0 refills | Status: DC | PRN
Start: 2019-11-05 — End: 2020-02-21

## 2019-11-05 MED ORDER — ENSURE ENLIVE PO LIQD: 237.0000 mL | Freq: Two times a day (BID) | ORAL | 0 refills | Status: AC

## 2019-11-05 MED ORDER — BISACODYL 10 MG RE SUPP
10.0000 mg | Freq: Once | RECTAL | Status: DC
  Filled 2019-11-05: qty 1

## 2019-11-05 MED ORDER — POLYETHYLENE GLYCOL 3350 17 G PO PACK
17.0000 g | PACK | Freq: Two times a day (BID) | ORAL | Status: DC
  Filled 2019-11-05: qty 1

## 2019-11-05 MED ORDER — ADULT MULTIVITAMIN W/MINERALS CH
1.0000 | ORAL_TABLET | Freq: Every day | ORAL | Status: DC
  Administered 2019-11-05: 1 via ORAL
  Filled 2019-11-05: qty 1

## 2019-11-05 MED ORDER — ENSURE ENLIVE PO LIQD
237.0000 mL | Freq: Two times a day (BID) | ORAL | Status: DC
  Administered 2019-11-05: 237 mL via ORAL

## 2019-11-05 MED ORDER — SENNOSIDES-DOCUSATE SODIUM 8.6-50 MG PO TABS
1.0000 | ORAL_TABLET | Freq: Two times a day (BID) | ORAL | Status: DC
  Administered 2019-11-05: 1 via ORAL
  Filled 2019-11-05: qty 1

## 2019-11-05 MED ORDER — FERROUS SULFATE 325 (65 FE) MG PO TABS
325.0000 mg | ORAL_TABLET | Freq: Every day | ORAL | Status: DC
  Administered 2019-11-05: 325 mg via ORAL
  Filled 2019-11-05: qty 1

## 2019-11-05 MED ORDER — PANTOPRAZOLE SODIUM 40 MG PO TBEC
40.0000 mg | DELAYED_RELEASE_TABLET | Freq: Every day | ORAL | Status: DC
  Administered 2019-11-05: 40 mg via ORAL
  Filled 2019-11-05: qty 1

## 2019-11-05 NOTE — Progress Notes (Signed)
  Echocardiogram 2D Echocardiogram has been performed.  Richard [name] 11/05/2019, 9:09 AM

## 2019-11-05 NOTE — Plan of Care (Signed)
  Problem: Education: Goal: Knowledge of condition and prescribed therapy will improve Outcome: Adequate for Discharge   Problem: Cardiac: Goal: Will achieve and/or maintain adequate cardiac output Outcome: Adequate for Discharge   Problem: Physical Regulation: Goal: Complications related to the disease process, condition or treatment will be avoided or minimized Outcome: Adequate for Discharge

## 2019-11-05 NOTE — Evaluation (Signed)
Physical Therapy Evaluation Patient Details Name: Richard [name] MRN: [id number] DOB: [birth year] Today's Date: 11/05/2019   History of Present Illness  Richard [name] is a 79 y.o. male with PMH of stage IIIa non-small cell lung CA, hypertension, hyperlipidemia, dementia, paroxysmal Afib on Xarelto who presented to ED after syncope/fall and admitted for observation.  Clinical Impression  Pt admitted with above diagnosis.  Pt currently with functional limitations due to the deficits listed below (see PT Problem List). Pt will benefit from skilled PT to increase their independence and safety with mobility to allow discharge to the venue listed below.  Pt likely near baseline level. Pt did complain of some L LE pain near end of gait of 175'.  Unsure if pt would normally walk this far. Will follow acutely, but feel he won't need any PT after d/c. He will need 24/7 S for safety.      Follow Up Recommendations No PT follow up;Supervision/Assistance - 24 hour    Equipment Recommendations  None recommended by PT    Recommendations for Other Services       Precautions / Restrictions Precautions Precautions: Fall Restrictions Weight Bearing Restrictions: No      Mobility  Bed Mobility               General bed mobility comments: sitting EOB upon arrival with nursing who were taking his orthostatics  Transfers Overall transfer level: Needs assistance Equipment used: Rolling walker (2 wheeled) Transfers: Sit to/from Stand Sit to Stand: Min guard         General transfer comment: min/guard for safety  Ambulation/Gait Ambulation/Gait assistance: Min guard;Min assist Gait Distance (Feet): 175 Feet Assistive device: Rolling walker (2 wheeled) Gait Pattern/deviations: Decreased stance time - left;Decreased weight shift to left;Antalgic     General Gait Details: Pt ambulated with RW with min/guard with MIN A for turns and tight spaces. Towards end of gait, pt began c/o L LE pain with  gait with grimacing and decreased WB. HR elevated into 130s by end of gait.  Stairs            Wheelchair Mobility    Modified Rankin (Stroke Patients Only)       Balance                                             Pertinent Vitals/Pain Pain Assessment: Faces Faces Pain Scale: Hurts even more Pain Location: L LE at end of gait Pain Descriptors / Indicators: Grimacing;Guarding Pain Intervention(s): Monitored during session;Limited activity within patient's tolerance;Repositioned    Home Living Family/patient expects to be discharged to:: Private residence Living Arrangements: Children Available Help at Discharge: Family Type of Home: House Home Access: Stairs to enter   Technical brewer of Steps: 2-3   Home Equipment: Environmental consultant - 2 wheels Additional Comments: Information gained from previous admission.    Prior Function Level of Independence: Needs assistance   Gait / Transfers Assistance Needed: Daughter assists him as needed.           Hand Dominance        Extremity/Trunk Assessment   Upper Extremity Assessment Upper Extremity Assessment: Defer to OT evaluation    Lower Extremity Assessment Lower Extremity Assessment: Overall WFL for tasks assessed(Pt c/o L LE pain at end of gait)       Communication   Communication: No difficulties  Cognition Arousal/Alertness:  Awake/alert Behavior During Therapy: WFL for tasks assessed/performed Overall Cognitive Status: History of cognitive impairments - at baseline                                        General Comments      Exercises     Assessment/Plan    PT Assessment Patient needs continued PT services  PT Problem List Decreased activity tolerance;Decreased balance;Decreased mobility;Decreased safety awareness       PT Treatment Interventions DME instruction;Gait training;Stair training;Functional mobility training;Balance training;Therapeutic  exercise;Therapeutic activities;Patient/family education    PT Goals (Current goals can be found in the Care Plan section)  Acute Rehab PT Goals Patient Stated Goal: get out of hospital and go home PT Goal Formulation: Patient unable to participate in goal setting Time For Goal Achievement: 11/18/19 Potential to Achieve Goals: Good    Frequency Min 3X/week   Barriers to discharge        Co-evaluation PT/OT/SLP Co-Evaluation/Treatment: Yes Reason for Co-Treatment: Necessary to address cognition/behavior during functional activity PT goals addressed during session: Mobility/safety with mobility;Proper use of DME;Balance         AM-PAC PT "6 Clicks" Mobility  Outcome Measure Help needed turning from your back to your side while in a flat bed without using bedrails?: None Help needed moving from lying on your back to sitting on the side of a flat bed without using bedrails?: None Help needed moving to and from a bed to a chair (including a wheelchair)?: A Little Help needed standing up from a chair using your arms (e.g., wheelchair or bedside chair)?: A Little Help needed to walk in hospital room?: A Little Help needed climbing 3-5 steps with a railing? : A Little 6 Click Score: 20    End of Session Equipment Utilized During Treatment: Gait belt Activity Tolerance: Patient tolerated treatment well;Patient limited by pain(at end of gait) Patient left: in chair;with call bell/phone within reach;with chair alarm set;with nursing/sitter in room Nurse Communication: Mobility status PT Visit Diagnosis: Unsteadiness on feet (R26.81);History of falling (Z91.81);Difficulty in walking, not elsewhere classified (R26.2);Pain Pain - Right/Left: Left Pain - part of body: Leg    Time: 6387-5643 PT Time Calculation (min) (ACUTE ONLY): 13 min   Charges:   PT Evaluation $PT Eval Low Complexity: 1 Low          Malyah Ohlrich L. Tamala Julian, [place] Pager [phone] 11/05/2019   [name] 11/05/2019,  11:06 AM

## 2019-11-05 NOTE — Progress Notes (Signed)
Discharge instructions reviewed with daughter over the phone. IV removed and pt dressed. Daughter was informed of d/c and will have ride to help discharge pt. PT currently awaiting ride.

## 2019-11-05 NOTE — Evaluation (Signed)
Occupational Therapy Evaluation Patient Details Name: Richard [name] MRN: [id number] DOB: 1940-11-03 Today's Date: 11/05/2019    History of Present Illness Richard [name] is a 79 y.o. male with PMH of stage IIIa non-small cell lung CA, hypertension, hyperlipidemia, dementia, paroxysmal Afib on Xarelto who presented to ED after syncope/fall and admitted for observation.   Clinical Impression   Mr. Richard [name] is a 79 year old admitted to hospital after syncopal event with a history of dementia. On evaluation patient demonstrates ability to perform functional mobility with min assist from standing and min guard for ambulation in hall. Patient demonstrates the the physical abilities to perform self care tasks with set up and verbal cues to perform and sequence tasks. Patient appears to be near his baseline. Recommend 24/7 assistance at home.     Follow Up Recommendations  No OT follow up    Equipment Recommendations  None recommended by OT    Recommendations for Other Services       Precautions / Restrictions Precautions Precautions: Fall Restrictions Weight Bearing Restrictions: No      Mobility Bed Mobility               General bed mobility comments: sitting EOB upon arrival with nursing who were taking his orthostatics  Transfers Overall transfer level: Needs assistance Equipment used: Rolling walker (2 wheeled) Transfers: Sit to/from Stand Sit to Stand: Min guard         General transfer comment: min/guard for safety to ambulate in hall with RW.    Balance Overall balance assessment: No apparent balance deficits (not formally assessed)                                         ADL either performed or assessed with clinical judgement   ADL Overall ADL's : At baseline                                       General ADL Comments: Appears to be at his baseline in regards to self care tasks. Able to perform if willing Patient  demonstrates ability to pull up socks, feed himself. Patient incontinent.     Vision         Perception     Praxis      Pertinent Vitals/Pain Pain Assessment: Faces Faces Pain Scale: Hurts a little bit Pain Location: L LE at end of gait Pain Descriptors / Indicators: Grimacing;Guarding Pain Intervention(s): Monitored during session;Limited activity within patient's tolerance;Repositioned     Hand Dominance     Extremity/Trunk Assessment Upper Extremity Assessment Upper Extremity Assessment: Overall WFL for tasks assessed   Lower Extremity Assessment Lower Extremity Assessment: Overall WFL for tasks assessed(Pt c/o L LE pain at end of gait)       Communication Communication Communication: No difficulties   Cognition Arousal/Alertness: Awake/alert Behavior During Therapy: WFL for tasks assessed/performed Overall Cognitive Status: History of cognitive impairments - at baseline                                     General Comments       Exercises     Shoulder Instructions      Home Living Family/patient expects to be discharged to:: Private  residence Living Arrangements: Children Available Help at Discharge: Family Type of Home: House Home Access: Stairs to enter Technical brewer of Steps: 2-3         Bathroom Shower/Tub: Occupational psychologist: Standard     Home Equipment: Environmental consultant - 2 wheels   Additional Comments: Information gained from previous admission.      Prior Functioning/Environment Level of Independence: Needs assistance  Gait / Transfers Assistance Needed: Daughter assists him as needed.              OT Problem List: Decreased cognition      OT Treatment/Interventions:      OT Goals(Current goals can be found in the care plan section) Acute Rehab OT Goals Patient Stated Goal: get out of hospital and go home OT Goal Formulation: All assessment and education complete, DC therapy  OT Frequency:      Barriers to D/C:            Co-evaluation PT/OT/SLP Co-Evaluation/Treatment: Yes Reason for Co-Treatment: Necessary to address cognition/behavior during functional activity PT goals addressed during session: Mobility/safety with mobility OT goals addressed during session: ADL's and self-care      AM-PAC OT "6 Clicks" Daily Activity     Outcome Measure Help from another person eating meals?: A Little Help from another person taking care of personal grooming?: A Little Help from another person toileting, which includes using toliet, bedpan, or urinal?: A Little Help from another person bathing (including washing, rinsing, drying)?: A Little Help from another person to put on and taking off regular upper body clothing?: A Little Help from another person to put on and taking off regular lower body clothing?: A Little 6 Click Score: 18   End of Session Equipment Utilized During Treatment: Gait belt Nurse Communication: Mobility status  Activity Tolerance: Patient tolerated treatment well Patient left: in chair;with nursing/sitter in room  OT Visit Diagnosis: Other symptoms and signs involving cognitive function;Muscle weakness (generalized) (M62.81)                Time: 7116-5790 OT Time Calculation (min): 13 min Charges:  OT General Charges $OT Visit: 1 Visit OT Evaluation $OT Eval Low Complexity: 1 Low  Tennie Grussing, OTR/L Acute Care Rehab Services  Office [phone]   Lenward Chancellor 11/05/2019, 11:54 AM

## 2019-11-05 NOTE — Discharge Summary (Signed)
Physician Discharge Summary  Richard [name] TOI:[id number] DOB: [birth year] DOA: 11/04/2019  PCP: [name], NP  Admit date: 11/04/2019 Discharge date: 11/05/2019  Admitted From: home3home Disposition: Home   Recommendations for Outpatient Follow-up:  1. Follow up with PCP in 1-2 weeks 2. Please obtain BMP/CBC in one week 3. Please follow up on the following pending results:  Home Health:none  Equipment/Devices: none  Discharge Condition: Stable Code Status: FULL Diet recommendation:  Diet Order            Diet regular Room service appropriate? Yes; Fluid consistency: Thin  Diet effective now        Diet - low sodium heart healthy              Brief/Interim Summary: 79 year old male with history of stage IIIa and an NSCCL CA, HTN, HLD, dementia, PAF on Xarelto presented with?  Syncope/fall.  Patient is a poor historian limited history.   On  admission admitting doctor did talk with the daughter who reports that patient lives with her. He was outside on the porch sitting on the steps and his eyes got big and he appeared to pass out and fell over. Reports that for the last few days he has been more confused. Unaware of dark stools. Reports possible constipation.   In the ED: Mild tachycardia and tachypnea otherwise vitals stable on room air. Labs remarkable for Hgb 9.5, WBC 10.2 and CMP WNL. UA pending collection. CT Head and C-spine without acute findings. ED requested admission as they did not feel comfortable with the patient going home  Patient admitted monitor overnight, orthostatic vitals obtained and negative.  He was able to ambulate in the hallway with physical therapy and advised supervision at home no equipment needed.  Discharge Diagnoses:   Syncope/versus fall: No injury, not orthostatic, no acute finding, question vasovagal episode.  Echo unremarkable telemetry stable.  I was able to talk to his daughter and per her patient was given beer yesterday I suspect it  may be related to intoxication/alcohol use and have instructed not to let him have any alcohol.  Is unremarkable.  At this time no further plan and patient is being discharged home with instruction to follow-up with PCP next week.  He did well with PT and no further need and daughter is providing 24/7 care.  Other issues Dementia without behavioral disturbance (pleasantly confused.  Continue supportive measures Stage III squamous cell carcinoma of right lung - History of pulmonary embolism Anemia of chronic disease hemoglobin baseline 8 to 11 g was 8.4 on 4/13 and on admission 9.5 this morning 8.9-more remains a stable without any drop further.  Patient refused rectal exam/fecal occult blood. GI was consulted on last admission for GI bleed and patient felt to be not a good candidate for colonoscopy, consult bradycardia management.  If he has issue with bleeding or further downtrending hemoglobin may benefit with holding Xarelto- he is advised to follow-up with his primary care doctor.Marland Kitchen PAF rate controlled if hemoglobin remains stable resume Xarelto.  Follow-up with PCP to discuss further risk benefits. History of PE again will resume Xarelto as above. Protein calorie moderation severe continue to augment diet. Constipation continue bowel regimen.  Add bisacodyl as needed.  Patient refuses rectal suppository  Echo reprots . Limited study, only subcostal views available, however there appears  to be normal biventricular systolic function, normal bi-atrial size and  only mild mitral regurgitation.  2. Left ventricular ejection fraction, by estimation, is 55 to 60%.  The  left ventricle has normal function. Left ventricular diastolic parameters  are consistent with Grade I diastolic dysfunction (impaired relaxation).  3. Right ventricular systolic function is normal.  4. Mild mitral valve regurgitation.  5. The inferior vena cava is normal in size with greater than 50%  respiratory variability,  suggesting right atrial pressure of 3 mmHg.   Consults:  PT  Subjective: As well denies any nausea vomiting fever chills.  Pleasantly confused. Follows command able to move all his extremities.  Discharge Exam: Vitals:   11/05/19 1141 11/05/19 1409  BP: 111/60 113/62  Pulse: (!) 105 93  Resp: 18 20  Temp: 97.6 F (36.4 C) 97.7 F (36.5 C)  SpO2: 100% 99%   General: Pt is alert, awake, not in acute distress Cardiovascular: RRR, S1/S2 +, no rubs, no gallops Respiratory: CTA bilaterally, no wheezing, no rhonchi Abdominal: Soft, NT, ND, bowel sounds + Extremities: no edema, no cyanosis  Discharge Instructions  Discharge Instructions    Diet - low sodium heart healthy   Complete by: As directed    Discharge instructions   Complete by: As directed    .  Check cbc from pcp In 3-5 days and discuss about your blood thinner.  Please call call MD or return to ER for similar or worsening recurring problem that brought you to hospital or if any fever,nausea/vomiting,abdominal pain, uncontrolled pain, chest pain,  shortness of breath or any other alarming symptoms.  Please follow-up your doctor as instructed in a week time and call the office for appointment.  Please avoid alcohol, smoking, or any other illicit substance and maintain healthy habits including taking your regular medications as prescribed.  You were cared for by a hospitalist during your hospital stay. If you have any questions about your discharge medications or the care you received while you were in the hospital after you are discharged, you can call the unit and ask to speak with the hospitalist on call if the hospitalist that took care of you is not available.  Once you are discharged, your primary care physician will handle any further medical issues. Please note that NO REFILLS for any discharge medications will be authorized once you are discharged, as it is imperative that you return to your primary care  physician (or establish a relationship with a primary care physician if you do not have one) for your aftercare needs so that they can reassess your need for medications and monitor your lab values   Increase activity slowly   Complete by: As directed      Allergies as of 11/05/2019   No Known Allergies     Medication List    TAKE these medications   Dulcolax 5 MG EC tablet Generic drug: bisacodyl Take 1 tablet (5 mg total) by mouth daily as needed for up to 30 doses for moderate constipation.   feeding supplement (ENSURE ENLIVE) Liqd Take 237 mLs by mouth 2 (two) times daily between meals.   ferrous sulfate 325 (65 FE) MG tablet Take 1 tablet (325 mg total) by mouth daily with breakfast.   multivitamin tablet Take 1 tablet by mouth daily.   pantoprazole 40 MG tablet Commonly known as: PROTONIX Take 1 tablet (40 mg total) by mouth daily.   polyethylene glycol 17 g packet Commonly known as: MIRALAX / GLYCOLAX Take 17 g by mouth 2 (two) times daily.   senna-docusate 8.6-50 MG tablet Commonly known as: Senokot-S Take 1 tablet by mouth 2 (two) times daily.  Xarelto 20 MG Tabs tablet Generic drug: rivaroxaban TAKE 1 TABLET BY MOUTH DAILY WITH SUPPER What changed: See the new instructions.       No Known Allergies  The results of significant diagnostics from this hospitalization (including imaging, microbiology, ancillary and laboratory) are listed below for reference.    Microbiology: Recent Results (from the past 240 hour(s))  SARS Coronavirus 2 by RT PCR (hospital order, performed in [facility] hospital lab) Nasopharyngeal Nasopharyngeal Swab     Status: None   Collection Time: 11/04/19 10:32 PM   Specimen: Nasopharyngeal Swab  Result Value Ref Range Status   SARS Coronavirus 2 NEGATIVE NEGATIVE Final    Comment: Performed at [facility], [place] [address], [place], [place] [address]    Procedures/Studies: CT Head Wo Contrast  Result Date:  11/04/2019 CLINICAL DATA:  Syncope, fell in a bush, loss of consciousness, unknown if before after the incident. On Xarelto. EXAM: CT HEAD WITHOUT CONTRAST CT CERVICAL SPINE WITHOUT CONTRAST TECHNIQUE: Multidetector CT imaging of the head and cervical spine was performed following the standard protocol without intravenous contrast. Multiplanar CT image reconstructions of the cervical spine were also generated. 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 Redemonstration of the advanced parenchymal volume loss and atrophy with marked ventriculomegaly which is unchanged from prior exam. No evidence of acute infarction, hemorrhage, hydrocephalus, extra-axial collection or mass lesion/mass effect. Patchy areas of white matter hypoattenuation are most compatible with chronic microvascular angiopathy. Vascular: Atherosclerotic calcification of the carotid siphons. No hyperdense vessel. Skull: Mild frontal scalp thickening, no subjacent calvarial fracture or acute osseous injury. Chronic deformity of the nasal bones. Sinuses/Orbits: Paranasal sinuses and mastoid air cells are predominantly clear. Included orbital structures are unremarkable. Other: None CT CERVICAL SPINE FINDINGS Alignment: Stabilization collar in place at the time of exam. Slight straightening of the normal cervical lordosis. Mild retrolisthesis of C5 on C6 is likely on a spondylitic basis. No evidence of traumatic listhesis. No abnormally widened, perched or jumped facets. Normal alignment of the craniocervical and atlantoaxial articulations. Skull base and vertebrae: No acute fracture. No primary bone lesion or focal pathologic process. Soft tissues and spinal canal: No pre or paravertebral fluid or swelling. No visible canal hematoma. Disc levels: Multilevel intervertebral disc height loss with spondylitic endplate changes. Advanced  uncinate spurring and hypertrophic facet degenerative changes are present as well. Posterior disc osteophyte complexes result in multilevel moderate canal stenosis C3-C6 and mild canal stenosis C6-7. Additional multilevel mild-to-moderate neural foraminal narrowing is seen bilaterally. Upper chest: Apical emphysematous changes. No acute abnormality in the upper chest or imaged lung apices. Other: Normal thyroid. IMPRESSION: 1. No acute intracranial abnormality. 2. 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 3. Stable advanced parenchymal volume loss and atrophy with marked ventriculomegaly. 4. Mild right frontal scalp thickening, no subjacent calvarial fracture or acute osseous injury. 5. No evidence of acute fracture or traumatic listhesis of the cervical spine. 6. Multilevel degenerative disc disease and facet degenerative changes of the cervical spine as described. Electronically Signed   By: Lovena Le M.D.   On: 11/04/2019 19:23   CT Cervical Spine Wo Contrast  Result Date: 11/04/2019 CLINICAL DATA:  Syncope, fell in a bush, loss of consciousness, unknown if before after the incident. On Xarelto. EXAM: CT HEAD WITHOUT CONTRAST CT CERVICAL SPINE WITHOUT CONTRAST TECHNIQUE: Multidetector CT imaging of the  head and cervical spine was performed following the standard protocol without intravenous contrast. Multiplanar CT image reconstructions of the cervical spine were also generated. 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 Redemonstration of the advanced parenchymal volume loss and atrophy with marked ventriculomegaly which is unchanged from prior exam. No evidence of acute infarction, hemorrhage, hydrocephalus, extra-axial collection or mass lesion/mass effect. Patchy areas of white matter hypoattenuation are most compatible with chronic  microvascular angiopathy. Vascular: Atherosclerotic calcification of the carotid siphons. No hyperdense vessel. Skull: Mild frontal scalp thickening, no subjacent calvarial fracture or acute osseous injury. Chronic deformity of the nasal bones. Sinuses/Orbits: Paranasal sinuses and mastoid air cells are predominantly clear. Included orbital structures are unremarkable. Other: None CT CERVICAL SPINE FINDINGS Alignment: Stabilization collar in place at the time of exam. Slight straightening of the normal cervical lordosis. Mild retrolisthesis of C5 on C6 is likely on a spondylitic basis. No evidence of traumatic listhesis. No abnormally widened, perched or jumped facets. Normal alignment of the craniocervical and atlantoaxial articulations. Skull base and vertebrae: No acute fracture. No primary bone lesion or focal pathologic process. Soft tissues and spinal canal: No pre or paravertebral fluid or swelling. No visible canal hematoma. Disc levels: Multilevel intervertebral disc height loss with spondylitic endplate changes. Advanced uncinate spurring and hypertrophic facet degenerative changes are present as well. Posterior disc osteophyte complexes result in multilevel moderate canal stenosis C3-C6 and mild canal stenosis C6-7. Additional multilevel mild-to-moderate neural foraminal narrowing is seen bilaterally. Upper chest: Apical emphysematous changes. No acute abnormality in the upper chest or imaged lung apices. Other: Normal thyroid. IMPRESSION: 1. No acute intracranial abnormality. 2. 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 3. Stable advanced parenchymal volume loss and atrophy with marked ventriculomegaly. 4. Mild right frontal scalp thickening, no subjacent calvarial fracture or acute osseous injury. 5. No evidence of acute fracture or traumatic listhesis of the cervical spine. 6. Multilevel degenerative disc disease and facet degenerative changes of the cervical  spine as described. Electronically Signed   By: Lovena Le M.D.   On: 11/04/2019 19:23   DG Chest Portable 1 View  Result Date: 11/04/2019 CLINICAL DATA:  Syncope, fell, known lung cancer EXAM: PORTABLE CHEST 1 VIEW COMPARISON:  08/31/2019, 09/24/2019 FINDINGS: Single frontal view of the chest demonstrates a stable cardiac silhouette. There is persistent right basilar volume loss and consolidation. Trace right pleural effusion. No pneumothorax. Left chest is clear. IMPRESSION: 1. Stable volume loss right hemithorax, with right basilar consolidation and small effusion. These findings could be related to patient's known malignancy and prior treatment, though superimposed infection cannot be excluded. 2. No evidence of acute trauma. Electronically Signed   By: Randa Ngo M.D.   On: 11/04/2019 23:10   ECHOCARDIOGRAM LIMITED  Result Date: 11/05/2019    ECHOCARDIOGRAM LIMITED REPORT   Patient Name:   [facility] Date of Exam: 11/05/2019 Medical Rec #:  [id number]    Height:       72.0 in Accession #:    [id number]   Weight:       152.6 lb Date of Birth:  1941/06/04    BSA:          1.899 m Patient Age:    34 years     BP:           116/59 mmHg Patient Gender: M  HR:           91 bpm. Exam Location:  Inpatient Procedure: 2D Echo Indications:    780.2 syncope  History:        Patient has prior history of Echocardiogram examinations, most                 recent 01/31/2019. Arrythmias:Atrial Fibrillation; Risk                 Factors:Hypertension, Dyslipidemia and Former Smoker. Acute PE.                 lung mass.  Sonographer:    Jannett Celestine RDCS (AE) Referring Phys: [id number] CHELSEA N FAIR  Sonographer Comments: Technically difficult study due to poor echo windows, no parasternal window and no apical window. limited mobility. limited capacity to control breathing IMPRESSIONS  1. Limited study, only subcostal views available, however there appears to be normal biventricular systolic function, normal  bi-atrial size and only mild mitral regurgitation.  2. Left ventricular ejection fraction, by estimation, is 55 to 60%. The left ventricle has normal function. Left ventricular diastolic parameters are consistent with Grade I diastolic dysfunction (impaired relaxation).  3. Right ventricular systolic function is normal.  4. Mild mitral valve regurgitation.  5. The inferior vena cava is normal in size with greater than 50% respiratory variability, suggesting right atrial pressure of 3 mmHg. FINDINGS  Left Ventricle: Left ventricular ejection fraction, by estimation, is 55 to 60%. The left ventricle has normal function. Right Ventricle: No increase in right ventricular wall thickness. Right ventricular systolic function is normal. Left Atrium: Left atrial size was normal in size. Mitral Valve: Mild mitral valve regurgitation. Tricuspid Valve: The tricuspid valve is not well visualized. Venous: The inferior vena cava is normal in size with greater than 50% respiratory variability, suggesting right atrial pressure of 3 mmHg.  LEFT ATRIUM             Index LA Vol (A2C):   26.1 ml 13.74 ml/m LA Vol (A4C):   26.4 ml 13.90 ml/m LA Biplane Vol: 27.4 ml 14.43 ml/m  AORTIC VALVE LVOT Vmax:   61.40 cm/s LVOT Vmean:  42.400 cm/s LVOT VTI:    0.113 m MITRAL VALVE MV Area (PHT): 4.06 cm    SHUNTS MV Decel Time: 187 msec    Systemic VTI: 0.11 m MV E velocity: 68.10 cm/s MV A velocity: 87.40 cm/s MV E/A ratio:  0.78 Ena Dawley MD Electronically signed by Ena Dawley MD Signature Date/Time: 11/05/2019/12:23:48 PM    Final     Labs: BNP (last 3 results) No results for input(s): BNP in the last 8760 hours. Basic Metabolic Panel: Recent Labs  Lab 11/04/19 1843 11/05/19 0510  NA 145 139  K 4.2 3.8  CL 110 110  CO2 25 25  GLUCOSE 117* 97  BUN 26* 21  CREATININE 0.81 0.61  CALCIUM 9.4 8.9  MG  --  1.9   Liver Function Tests: Recent Labs  Lab 11/04/19 1843  AST 20  ALT 15  ALKPHOS 66  BILITOT 0.3   PROT 6.5  ALBUMIN 3.5   No results for input(s): LIPASE, AMYLASE in the last 168 hours. No results for input(s): AMMONIA in the last 168 hours. CBC: Recent Labs  Lab 11/04/19 1843 11/05/19 0510 11/05/19 1312  WBC 10.2 9.0  --   NEUTROABS 8.1*  --   --   HGB 9.5* 8.9* 8.9*  HCT 30.3* 27.4* 27.9*  MCV 70.5* 69.7*  --  PLT 273 240  --    Cardiac Enzymes: No results for input(s): CKTOTAL, CKMB, CKMBINDEX, TROPONINI in the last 168 hours. BNP: Invalid input(s): POCBNP CBG: No results for input(s): GLUCAP in the last 168 hours. D-Dimer No results for input(s): DDIMER in the last 72 hours. Hgb A1c No results for input(s): HGBA1C in the last 72 hours. Lipid Profile No results for input(s): CHOL, HDL, LDLCALC, TRIG, CHOLHDL, LDLDIRECT in the last 72 hours. Thyroid function studies No results for input(s): TSH, T4TOTAL, T3FREE, THYROIDAB in the last 72 hours.  Invalid input(s): FREET3 Anemia work up No results for input(s): VITAMINB12, FOLATE, FERRITIN, TIBC, IRON, RETICCTPCT in the last 72 hours. Urinalysis    Component Value Date/Time   COLORURINE YELLOW 09/24/2019 1310   APPEARANCEUR CLOUDY (A) 09/24/2019 1310   LABSPEC 1.013 09/24/2019 1310   PHURINE 7.0 09/24/2019 1310   GLUCOSEU NEGATIVE 09/24/2019 1310   HGBUR NEGATIVE 09/24/2019 1310   BILIRUBINUR NEGATIVE 09/24/2019 1310   KETONESUR NEGATIVE 09/24/2019 1310   PROTEINUR NEGATIVE 09/24/2019 1310   NITRITE POSITIVE (A) 09/24/2019 1310   LEUKOCYTESUR SMALL (A) 09/24/2019 1310   Sepsis Labs Invalid input(s): PROCALCITONIN,  WBC,  LACTICIDVEN Microbiology Recent Results (from the past 240 hour(s))  SARS Coronavirus 2 by RT PCR (hospital order, performed in [place] hospital lab) Nasopharyngeal Nasopharyngeal Swab     Status: None   Collection Time: 11/04/19 10:32 PM   Specimen: Nasopharyngeal Swab  Result Value Ref Range Status   SARS Coronavirus 2 NEGATIVE NEGATIVE Final    Comment: Performed at [facility], [place] [address] [place], [place] [address]     Time coordinating discharge: 25  minutes  SIGNED: Antonieta Pert, MD  [facility] 11/05/2019, 2:20 PM  If 7PM-7AM, please contact night-coverage [email or web address]

## 2019-11-07 ENCOUNTER — Telehealth: Payer: Self-pay

## 2019-11-07 NOTE — Telephone Encounter (Signed)
Transition Care Management Follow-up Telephone Call  Call completed with patient's daughter, Richard [name]  Date of discharge and from where: 11/05/2019, [facility]   How have you been since you were released from the hospital? Richard [name] stated that he is " doing better."   Any questions or concerns? Richard [name] said that she gave him Aleve for pain. She wants to clarify what to give him.  Explained to her that he is on xarelto a blood thinner.  Will confirm with PCP if he should be taking tylenol instead.     Items Reviewed:  Did the pt receive and understand the discharge instructions provided? Yes, Richard [name] understands them.   Medications obtained and verified? Richard [name] stated that he has all of his medications.  She did not have any questions about the meds at this time,   Any new allergies since your discharge?  none reported   Do you have support at home? he lives with daughter,[name], and her child.   Other (ie: DME, Home Health, etc) no home health ordered,   He as a RW that Richard [name] says he uses when ho goes outside.  Functional Questionnaire: (I = Independent and D = Dependent) ADL's:daughter assists as needed   Follow up appointments reviewed:    PCP Hospital f/u appt confirmed?Ms Oletta Lamas, NP - 11/18/2019 @ 1050.  She wants her father to have an in person appt does not want a televisit.   [facility] f/u appt confirmed? oncology - 12/05/2019  Are transportation arrangements needed?  he uses [employer] and Richard [name] accompanies him everywhere  If their condition worsens, is the pt aware to call  their PCP or go to the ED?yes, Richard [name] is aware  Was the patient provided with contact information for the PCP's office or ED?  yes  Was the pt encouraged to call back with questions or concerns?  yes, Richard [name] was instructed to call with questions.

## 2019-11-18 ENCOUNTER — Ambulatory Visit (INDEPENDENT_AMBULATORY_CARE_PROVIDER_SITE_OTHER): Payer: Medicare HMO | Admitting: Primary Care

## 2019-11-21 ENCOUNTER — Other Ambulatory Visit: Payer: Self-pay | Admitting: Internal Medicine

## 2019-11-21 DIAGNOSIS — I2699 Other pulmonary embolism without acute cor pulmonale: Secondary | ICD-10-CM

## 2019-12-02 ENCOUNTER — Other Ambulatory Visit: Payer: Self-pay

## 2019-12-02 ENCOUNTER — Ambulatory Visit (HOSPITAL_COMMUNITY)
Admission: RE | Admit: 2019-12-02 | Discharge: 2019-12-02 | Disposition: A | Discharge status: Home / Self Care | Payer: Medicare HMO | Source: Ambulatory Visit | Attending: Internal Medicine | Admitting: Internal Medicine

## 2019-12-02 ENCOUNTER — Inpatient Hospital Stay: Payer: Medicare HMO | Attending: Internal Medicine

## 2019-12-02 DIAGNOSIS — Z9221 Personal history of antineoplastic chemotherapy: Secondary | ICD-10-CM | POA: Diagnosis not present

## 2019-12-02 DIAGNOSIS — Z8701 Personal history of pneumonia (recurrent): Secondary | ICD-10-CM | POA: Diagnosis not present

## 2019-12-02 DIAGNOSIS — C3490 Malignant neoplasm of unspecified part of unspecified bronchus or lung: Secondary | ICD-10-CM

## 2019-12-02 DIAGNOSIS — Z86711 Personal history of pulmonary embolism: Secondary | ICD-10-CM | POA: Diagnosis not present

## 2019-12-02 DIAGNOSIS — Z923 Personal history of irradiation: Secondary | ICD-10-CM | POA: Diagnosis not present

## 2019-12-02 DIAGNOSIS — C342 Malignant neoplasm of middle lobe, bronchus or lung: Secondary | ICD-10-CM | POA: Diagnosis not present

## 2019-12-02 LAB — CBC WITH DIFFERENTIAL (CANCER CENTER ONLY)
Abs Immature Granulocytes: 0.01 10*3/uL (ref 0.00–0.07)
Basophils Absolute: 0 10*3/uL (ref 0.0–0.1)
Basophils Relative: 0 %
Eosinophils Absolute: 0 10*3/uL (ref 0.0–0.5)
Eosinophils Relative: 1 %
HCT: 39.4 % (ref 39.0–52.0)
Hemoglobin: 12.8 g/dL — ABNORMAL LOW (ref 13.0–17.0)
Immature Granulocytes: 0 %
Lymphocytes Relative: 53 %
Lymphs Abs: 2.5 10*3/uL (ref 0.7–4.0)
MCH: 22 pg — ABNORMAL LOW (ref 26.0–34.0)
MCHC: 32.5 g/dL (ref 30.0–36.0)
MCV: 67.8 fL — ABNORMAL LOW (ref 80.0–100.0)
Monocytes Absolute: 0.5 10*3/uL (ref 0.1–1.0)
Monocytes Relative: 11 %
Neutro Abs: 1.6 10*3/uL — ABNORMAL LOW (ref 1.7–7.7)
Neutrophils Relative %: 35 %
Platelet Count: 329 10*3/uL (ref 150–400)
RBC: 5.81 MIL/uL (ref 4.22–5.81)
RDW: 18.1 % — ABNORMAL HIGH (ref 11.5–15.5)
WBC Count: 4.7 10*3/uL (ref 4.0–10.5)
nRBC: 0 % (ref 0.0–0.2)

## 2019-12-02 LAB — CMP (CANCER CENTER ONLY)
ALT: 12 U/L (ref 0–44)
AST: 18 U/L (ref 15–41)
Albumin: 3.7 g/dL (ref 3.5–5.0)
Alkaline Phosphatase: 95 U/L (ref 38–126)
Anion gap: 8 (ref 5–15)
BUN: 11 mg/dL (ref 8–23)
CO2: 27 mmol/L (ref 22–32)
Calcium: 10.1 mg/dL (ref 8.9–10.3)
Chloride: 108 mmol/L (ref 98–111)
Creatinine: 0.84 mg/dL (ref 0.61–1.24)
GFR, Est AFR Am: >60 mL/min (ref >60)
GFR, Estimated: >60 mL/min (ref >60)
Glucose, Bld: 79 mg/dL (ref 70–99)
Potassium: 3.9 mmol/L (ref 3.5–5.1)
Sodium: 143 mmol/L (ref 135–145)
Total Bilirubin: 0.3 mg/dL (ref 0.3–1.2)
Total Protein: 7.5 g/dL (ref 6.5–8.1)

## 2019-12-02 MED ORDER — SODIUM CHLORIDE (PF) 0.9 % IJ SOLN
INTRAMUSCULAR | Status: AC
  Filled 2019-12-02: qty 50

## 2019-12-02 MED ORDER — IOHEXOL 300 MG/ML  SOLN
75.0000 mL | Freq: Once | INTRAMUSCULAR | Status: AC | PRN
  Administered 2019-12-02: 75 mL via INTRAVENOUS

## 2019-12-05 ENCOUNTER — Inpatient Hospital Stay (HOSPITAL_BASED_OUTPATIENT_CLINIC_OR_DEPARTMENT_OTHER): Payer: Medicare HMO | Admitting: Internal Medicine

## 2019-12-05 ENCOUNTER — Other Ambulatory Visit: Payer: Self-pay

## 2019-12-05 ENCOUNTER — Encounter: Payer: Self-pay | Admitting: Internal Medicine

## 2019-12-05 VITALS — BP 124/76 | HR 113 | Temp 98.1°F | Resp 18 | Ht 72.0 in | Wt 151.3 lb

## 2019-12-05 DIAGNOSIS — C3491 Malignant neoplasm of unspecified part of right bronchus or lung: Secondary | ICD-10-CM

## 2019-12-05 DIAGNOSIS — Z9221 Personal history of antineoplastic chemotherapy: Secondary | ICD-10-CM | POA: Diagnosis not present

## 2019-12-05 DIAGNOSIS — Z8701 Personal history of pneumonia (recurrent): Secondary | ICD-10-CM | POA: Diagnosis not present

## 2019-12-05 DIAGNOSIS — C342 Malignant neoplasm of middle lobe, bronchus or lung: Secondary | ICD-10-CM | POA: Diagnosis not present

## 2019-12-05 DIAGNOSIS — Z86711 Personal history of pulmonary embolism: Secondary | ICD-10-CM | POA: Diagnosis not present

## 2019-12-05 DIAGNOSIS — Z923 Personal history of irradiation: Secondary | ICD-10-CM | POA: Diagnosis not present

## 2019-12-05 DIAGNOSIS — C3490 Malignant neoplasm of unspecified part of unspecified bronchus or lung: Secondary | ICD-10-CM

## 2019-12-05 NOTE — Progress Notes (Signed)
[name] [phone]   [phone]  OFFICE PROGRESS NOTE  [name], NP [address] [place] [address]  DIAGNOSIS: Stage IIIA(T3, N2, M0) non-small cell lung cancer, squamous cell carcinoma presented with large right middle lobe lung mass with postobstructive pneumonia as well as right hilar and mediastinal lymphadenopathy diagnosed in July 2020. He also has a suspicious small right pleural effusion and small indeterminate metabolic focus in the liver.   PRIOR THERAPY: Concurrent chemoradiation with Carboplatin for an AUC of 2 and Paclitaxel 45 mg/m2. First dose expected on 01/24/2019.  Status post 9 cycles.  Last treatment was given on April 25, 2019 with partial response.  CURRENT THERAPY:  Observation.  INTERVAL HISTORY: Richard [name] 79 y.o. male returns to the clinic today for follow-up visit accompanied by his daughter.  The patient is feeling fine today with no concerning complaints.  He denied having any chest pain, shortness of breath, cough or hemoptysis.  He denied having any nausea or vomiting.  He has no diarrhea but intermittent constipation.  The patient denied having any recent weight loss or night sweats.  He has no headache or visual changes.  He is here today for evaluation with repeat CT scan of the chest for restaging of his disease.   MEDICAL HISTORY: Past Medical History:  Diagnosis Date  . Acute pulmonary embolism ([place]) 01/30/2019  . Acute respiratory failure with hypoxia ([place]) 01/30/2019  . AKI (acute kidney injury) ([facility]) 01/30/2019  . Atrial fibrillation with RVR ([place])   . Dementia ([place])   . High cholesterol   . Hypertension   . Lung mass 12/2018  . Severe sepsis ([place]) 01/30/2019    ALLERGIES:  has No Known Allergies.  MEDICATIONS:  Current Outpatient Medications  Medication Sig Dispense Refill  . bisacodyl (DULCOLAX) 5 MG EC tablet Take 1 tablet (5 mg total) by mouth daily as needed for up to  30 doses for moderate constipation. 30 tablet 0  . feeding supplement, ENSURE ENLIVE, (ENSURE ENLIVE) LIQD Take 237 mLs by mouth 2 (two) times daily between meals. 14220 mL 0  . ferrous sulfate 325 (65 FE) MG tablet Take 1 tablet (325 mg total) by mouth daily with breakfast. 30 tablet 3  . Multiple Vitamin (MULTIVITAMIN) tablet Take 1 tablet by mouth daily.    . pantoprazole (PROTONIX) 40 MG tablet Take 1 tablet (40 mg total) by mouth daily. 30 tablet 0  . polyethylene glycol (MIRALAX / GLYCOLAX) 17 g packet Take 17 g by mouth 2 (two) times daily. 14 each 0  . polyethylene glycol powder (GLYCOLAX/MIRALAX) 17 GM/SCOOP powder TAKE 17 GRAMS MIXED IN WATER OR OTHER BEVERAGE BY MOUTH 2 TIMES DAILY 238 g 0  . STOOL SOFTENER/LAXATIVE 50-8.6 MG tablet TAKE 1 TABLET BY MOUTH TWICE DAILY 30 tablet 0  . XARELTO 20 MG TABS tablet TAKE 1 TABLET BY MOUTH DAILY WITH SUPPER 30 tablet 0   No current facility-administered medications for this visit.    SURGICAL HISTORY:  Past Surgical History:  Procedure Laterality Date  . IR GASTROSTOMY TUBE MOD SED  05/25/2019  . IR GASTROSTOMY TUBE REMOVAL  09/08/2019  . JOINT REPLACEMENT    . VIDEO BRONCHOSCOPY WITH ENDOBRONCHIAL ULTRASOUND N/A 12/16/2018   Procedure: VIDEO BRONCHOSCOPY WITH ENDOBRONCHIAL ULTRASOUND AND FLUROSCOPY;  Surgeon: [name], MD;  Location: [facility];  Service: Pulmonary;  Laterality: N/A;    REVIEW OF SYSTEMS:  A comprehensive review of systems was negative except for:  Constitutional: positive for fatigue Respiratory: positive for dyspnea on exertion Musculoskeletal: positive for arthralgias   PHYSICAL EXAMINATION: General appearance: alert, cooperative, fatigued and no distress Head: Normocephalic, without obvious abnormality, atraumatic Neck: no adenopathy, no JVD, supple, symmetrical, trachea midline and thyroid not enlarged, symmetric, no tenderness/mass/nodules Lymph nodes: Cervical, supraclavicular, and axillary nodes normal. Resp:  clear to auscultation bilaterally Back: symmetric, no curvature. ROM normal. No CVA tenderness. Cardio: regular rate and rhythm, S1, S2 normal, no murmur, click, rub or gallop GI: soft, non-tender; bowel sounds normal; no masses,  no organomegaly Extremities: extremities normal, atraumatic, no cyanosis or edema  ECOG PERFORMANCE STATUS: 1 - Symptomatic but completely ambulatory  Blood pressure 124/76, pulse (!) 113, temperature 98.1 F (36.7 C), temperature source Temporal, resp. rate 18, height 6' (1.829 m), weight 151 lb 4.8 oz (68.6 kg), SpO2 98 %.  LABORATORY DATA: Lab Results  Component Value Date   WBC 4.7 12/02/2019   HGB 12.8 (L) 12/02/2019   HCT 39.4 12/02/2019   MCV 67.8 (L) 12/02/2019   PLT 329 12/02/2019      Chemistry      Component Value Date/Time   NA 143 12/02/2019 1352   NA 142 10/07/2019 0829   K 3.9 12/02/2019 1352   CL 108 12/02/2019 1352   CO2 27 12/02/2019 1352   BUN 11 12/02/2019 1352   BUN 11 10/07/2019 0829   CREATININE 0.84 12/02/2019 1352      Component Value Date/Time   CALCIUM 10.1 12/02/2019 1352   ALKPHOS 95 12/02/2019 1352   AST 18 12/02/2019 1352   ALT 12 12/02/2019 1352   BILITOT 0.3 12/02/2019 1352       RADIOGRAPHIC STUDIES: CT Chest W Contrast  Result Date: 12/05/2019 CLINICAL DATA:  Non-small-cell lung cancer.  Restaging. EXAM: CT CHEST WITH CONTRAST TECHNIQUE: Multidetector CT imaging of the chest was performed during intravenous contrast administration. CONTRAST:  74mL OMNIPAQUE IOHEXOL 300 MG/ML  SOLN COMPARISON:  08/31/2019 FINDINGS: Cardiovascular: The heart size is normal. No substantial pericardial effusion. Coronary artery calcification is evident. Atherosclerotic calcification is noted in the wall of the thoracic aorta. Mediastinum/Nodes: No mediastinal lymphadenopathy. No left hilar lymphadenopathy. Post treatment changes noted right hilum. The esophagus has normal imaging features. There is no axillary lymphadenopathy.  Lungs/Pleura: Centrilobular and paraseptal emphysema evident. Right parahilar scarring with right middle lobe volume loss with consolidative opacity associated bronchiectasis is compatible with prior radiation therapy. Interstitial prominence in the inferior right upper lobe and right lower lobe is stable, presumably treatment related although there is more prominent patchy airspace disease at the right base today (image 119/5). 5 mm left lower lobe nodule on 112/5 is stable. Small right pleural effusion is mildly progressive in the interval. Upper Abdomen: Unremarkable. Musculoskeletal: No worrisome lytic or sclerotic osseous abnormality. IMPRESSION: 1. Interval development of patchy airspace disease at the right base, likely infectious/inflammatory. Attention on follow-up recommended. 2. Stable appearance of post treatment changes in the right lung including right middle lobe collapse/bronchiectasis. 3. Slight increase in small right pleural effusion. 4. Stable 5 mm left lower lobe pulmonary nodule. 5. Aortic Atherosclerosis (ICD10-I70.0) and Emphysema (ICD10-J43.9). Electronically Signed   By: Misty Stanley M.D.   On: 12/05/2019 10:45    ASSESSMENT AND PLAN: This is a very pleasant 79 years old African-American male with a stage IIIA non-small cell lung cancer, squamous cell carcinoma. The patient started a course of concurrent chemoradiation with weekly carboplatin for AUC of 2 and paclitaxel 45 mg/M2 status post 9 cycles  but this treatment was interrupted secondary to recent hospitalization for pneumonia and pulmonary embolism. He has partial response to this treatment.   The patient is currently on observation and he is feeling fine today with no concerning complaints. He had repeat CT scan of the chest performed recently.  I personally and independently reviewed the scans and discussed the results with the patient today. His scan showed no concerning findings for disease progression. I recommended  for the patient to continue on observation with repeat CT scan of the chest in 6 months. He was advised to call immediately if he has any concerning symptoms in the interval. The patient voices understanding of current disease status and treatment options and is in agreement with the current care plan.  All questions were answered. The patient knows to call the clinic with any problems, questions or concerns. We can certainly see the patient much sooner if necessary.  Disclaimer: This note was dictated with voice recognition software. Similar sounding words can inadvertently be transcribed and may not be corrected upon review.

## 2019-12-09 ENCOUNTER — Other Ambulatory Visit: Payer: Self-pay | Admitting: Internal Medicine

## 2019-12-09 DIAGNOSIS — I2699 Other pulmonary embolism without acute cor pulmonale: Secondary | ICD-10-CM

## 2019-12-12 ENCOUNTER — Telehealth: Payer: Self-pay | Admitting: Internal Medicine

## 2019-12-12 MED FILL — STOOL SOFTENER-LAXATIVE TAB: 50-8.6 | 15 days supply | Qty: 30 | Fill #0

## 2019-12-12 NOTE — Telephone Encounter (Signed)
Scheduled per los. Called and left msg. Mailed printout  °

## 2019-12-19 MED FILL — XARELTO 20 MG TABLET: 20 | 30 days supply | Qty: 30 | Fill #0

## 2020-01-02 ENCOUNTER — Other Ambulatory Visit: Payer: Self-pay | Admitting: Internal Medicine

## 2020-01-02 MED FILL — FERROUS SULFATE 325 MG TAB: 325 (65 FE) | 30 days supply | Qty: 30 | Fill #3

## 2020-01-24 ENCOUNTER — Other Ambulatory Visit: Payer: Self-pay | Admitting: Internal Medicine

## 2020-01-24 ENCOUNTER — Telehealth: Payer: Self-pay | Admitting: Medical Oncology

## 2020-01-24 DIAGNOSIS — I2699 Other pulmonary embolism without acute cor pulmonale: Secondary | ICD-10-CM

## 2020-01-24 MED FILL — XARELTO 20 MG TABLET: 20 | 30 days supply | Qty: 30 | Fill #0

## 2020-01-24 NOTE — Telephone Encounter (Signed)
xarelto pt assistance- She is asking if he can get pt assistance for his prescription.

## 2020-01-25 ENCOUNTER — Encounter: Payer: Self-pay | Admitting: Internal Medicine

## 2020-01-25 NOTE — Progress Notes (Signed)
Received message from RN regarding assistance with Xarelto.  Printed [employer] application and left physician form on RN desk to be completed and returned.  Once received, I will contact patient/family to mail or have them pick up and complete their portion by appointment.

## 2020-01-26 ENCOUNTER — Encounter: Payer: Self-pay | Admitting: Internal Medicine

## 2020-01-26 NOTE — Progress Notes (Signed)
Received signed physician form for [employer] for assistance with Xarelto.  Called daughter and left message to return my call to discuss arranging them to get their portion of application to complete.  Left my contact name and number.

## 2020-01-31 ENCOUNTER — Encounter (HOSPITAL_COMMUNITY): Payer: Self-pay | Admitting: Emergency Medicine

## 2020-01-31 ENCOUNTER — Emergency Department (HOSPITAL_COMMUNITY)
Admission: EM | Admit: 2020-01-31 | Discharge: 2020-02-01 | Disposition: A | Discharge status: Home / Self Care | Payer: Medicare HMO | Attending: Emergency Medicine | Admitting: Emergency Medicine

## 2020-01-31 ENCOUNTER — Emergency Department (HOSPITAL_COMMUNITY): Payer: Medicare HMO

## 2020-01-31 ENCOUNTER — Other Ambulatory Visit: Payer: Self-pay

## 2020-01-31 DIAGNOSIS — A419 Sepsis, unspecified organism: Secondary | ICD-10-CM | POA: Diagnosis not present

## 2020-01-31 DIAGNOSIS — R1084 Generalized abdominal pain: Secondary | ICD-10-CM | POA: Diagnosis not present

## 2020-01-31 DIAGNOSIS — Z20822 Contact with and (suspected) exposure to covid-19: Secondary | ICD-10-CM | POA: Diagnosis not present

## 2020-01-31 DIAGNOSIS — Z79899 Other long term (current) drug therapy: Secondary | ICD-10-CM | POA: Insufficient documentation

## 2020-01-31 DIAGNOSIS — R0602 Shortness of breath: Secondary | ICD-10-CM | POA: Diagnosis not present

## 2020-01-31 DIAGNOSIS — R000 Tachycardia, unspecified: Secondary | ICD-10-CM | POA: Insufficient documentation

## 2020-01-31 DIAGNOSIS — R0689 Other abnormalities of breathing: Secondary | ICD-10-CM | POA: Diagnosis not present

## 2020-01-31 DIAGNOSIS — Z87891 Personal history of nicotine dependence: Secondary | ICD-10-CM | POA: Insufficient documentation

## 2020-01-31 DIAGNOSIS — N3001 Acute cystitis with hematuria: Secondary | ICD-10-CM

## 2020-01-31 DIAGNOSIS — J90 Pleural effusion, not elsewhere classified: Secondary | ICD-10-CM | POA: Diagnosis not present

## 2020-01-31 DIAGNOSIS — R0902 Hypoxemia: Secondary | ICD-10-CM | POA: Diagnosis not present

## 2020-01-31 DIAGNOSIS — N400 Enlarged prostate without lower urinary tract symptoms: Secondary | ICD-10-CM | POA: Diagnosis not present

## 2020-01-31 DIAGNOSIS — R0682 Tachypnea, not elsewhere classified: Secondary | ICD-10-CM | POA: Diagnosis not present

## 2020-01-31 DIAGNOSIS — I10 Essential (primary) hypertension: Secondary | ICD-10-CM | POA: Diagnosis not present

## 2020-01-31 DIAGNOSIS — F0390 Unspecified dementia without behavioral disturbance: Secondary | ICD-10-CM | POA: Diagnosis not present

## 2020-01-31 DIAGNOSIS — R69 Illness, unspecified: Secondary | ICD-10-CM | POA: Diagnosis not present

## 2020-01-31 DIAGNOSIS — R52 Pain, unspecified: Secondary | ICD-10-CM | POA: Diagnosis not present

## 2020-01-31 DIAGNOSIS — N3289 Other specified disorders of bladder: Secondary | ICD-10-CM | POA: Diagnosis not present

## 2020-01-31 LAB — COMPREHENSIVE METABOLIC PANEL
ALT: 16 U/L (ref 0–44)
AST: 21 U/L (ref 15–41)
Albumin: 4 g/dL (ref 3.5–5.0)
Alkaline Phosphatase: 81 U/L (ref 38–126)
Anion gap: 11 (ref 5–15)
BUN: 14 mg/dL (ref 8–23)
CO2: 27 mmol/L (ref 22–32)
Calcium: 9.9 mg/dL (ref 8.9–10.3)
Chloride: 106 mmol/L (ref 98–111)
Creatinine, Ser: 0.93 mg/dL (ref 0.61–1.24)
GFR calc Af Amer: >60 mL/min (ref >60)
GFR calc non Af Amer: >60 mL/min (ref >60)
Glucose, Bld: 117 mg/dL — ABNORMAL HIGH (ref 70–99)
Potassium: 4 mmol/L (ref 3.5–5.1)
Sodium: 144 mmol/L (ref 135–145)
Total Bilirubin: 0.4 mg/dL (ref 0.3–1.2)
Total Protein: 7.4 g/dL (ref 6.5–8.1)

## 2020-01-31 LAB — CBC WITH DIFFERENTIAL/PLATELET
Abs Immature Granulocytes: 0.02 10*3/uL (ref 0.00–0.07)
Basophils Absolute: 0 10*3/uL (ref 0.0–0.1)
Basophils Relative: 0 %
Eosinophils Absolute: 0.1 10*3/uL (ref 0.0–0.5)
Eosinophils Relative: 1 %
HCT: 40.7 % (ref 39.0–52.0)
Hemoglobin: 13.5 g/dL (ref 13.0–17.0)
Immature Granulocytes: 0 %
Lymphocytes Relative: 21 %
Lymphs Abs: 1.5 10*3/uL (ref 0.7–4.0)
MCH: 22.5 pg — ABNORMAL LOW (ref 26.0–34.0)
MCHC: 33.2 g/dL (ref 30.0–36.0)
MCV: 67.9 fL — ABNORMAL LOW (ref 80.0–100.0)
Monocytes Absolute: 0.8 10*3/uL (ref 0.1–1.0)
Monocytes Relative: 11 %
Neutro Abs: 4.7 10*3/uL (ref 1.7–7.7)
Neutrophils Relative %: 67 %
Platelets: 276 10*3/uL (ref 150–400)
RBC: 5.99 MIL/uL — ABNORMAL HIGH (ref 4.22–5.81)
RDW: 18.1 % — ABNORMAL HIGH (ref 11.5–15.5)
WBC: 7.2 10*3/uL (ref 4.0–10.5)
nRBC: 0 % (ref 0.0–0.2)

## 2020-01-31 LAB — APTT: aPTT: 44 seconds — ABNORMAL HIGH (ref 24–36)

## 2020-01-31 LAB — PROTIME-INR
INR: 1.7 — ABNORMAL HIGH (ref 0.8–1.2)
Prothrombin Time: 19.7 seconds — ABNORMAL HIGH (ref 11.4–15.2)

## 2020-01-31 LAB — BLOOD GAS, VENOUS
Acid-Base Excess: 2.6 mmol/L — ABNORMAL HIGH (ref 0.0–2.0)
Bicarbonate: 28.4 mmol/L — ABNORMAL HIGH (ref 20.0–28.0)
O2 Saturation: 38.2 %
Patient temperature: 98.6
pCO2, Ven: 51.4 mmHg (ref 44.0–60.0)
pH, Ven: 7.362 (ref 7.250–7.430)
pO2, Ven: <31 mmHg — CL (ref 32.0–45.0)

## 2020-01-31 LAB — BRAIN NATRIURETIC PEPTIDE: B Natriuretic Peptide: 43.7 pg/mL (ref 0.0–100.0)

## 2020-01-31 LAB — LACTIC ACID, PLASMA: Lactic Acid, Venous: 1.7 mmol/L (ref 0.5–1.9)

## 2020-01-31 LAB — TROPONIN I (HIGH SENSITIVITY)
Troponin I (High Sensitivity): 5 ng/L (ref <18)
Troponin I (High Sensitivity): 5 ng/L (ref <18)

## 2020-01-31 MED ORDER — IOHEXOL 350 MG/ML SOLN
100.0000 mL | Freq: Once | INTRAVENOUS | Status: AC | PRN
  Administered 2020-01-31: 100 mL via INTRAVENOUS

## 2020-01-31 NOTE — ED Provider Notes (Signed)
[facility] DEPT Provider Note   CSN: [id number] Arrival date & time: 01/31/20  2009     History No chief complaint on file.   Richard [name] is a 79 y.o. 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  I personally obtained history from EMS who reports that he was tachypneic and tachycardic in route to the hospital and his chief complaint was abdominal pain.  They had spoken with the daughter who report that had been several days since onset of symptoms.  On my examination, patient is mildly confused, but in good spirits and answering most questions appropriately.  He is complaining predominantly of abdominal pain, but states that his shortness of breath is close to baseline.  Level 5 caveat due to dementia.  I called his daughter, Richard [name], who reports that he has been taking his medications, as directed.  She also states that he has been eating and drinking regularly and having bowel movements each day.  She states that the past few bowel movements have been particularly dark, but she tells me that he takes iron supplements.  She is concerned about possible bleed.  She states that he has dementia at baseline, but has been more confused recently.    HPI     Past Medical History:  Diagnosis Date  . Acute pulmonary embolism ([place]) 01/30/2019  . Acute respiratory failure with hypoxia ([place]) 01/30/2019  . AKI (acute kidney injury) ([place]) 01/30/2019  . Atrial fibrillation with RVR ([place])   . Dementia ([place])   . High cholesterol   . Hypertension   . Lung mass 12/2018  . Severe sepsis ([place]) 01/30/2019    Patient Active Problem List   Diagnosis Date Noted  . Syncope 11/04/2019  . Fall at home, initial encounter 11/04/2019  . Anemia 09/25/2019  . Symptomatic anemia  09/24/2019  . Paroxysmal atrial fibrillation ([place]) 07/06/2019  . Pressure injury of skin 05/21/2019  . Anorexia 05/20/2019  . HCAP (healthcare-associated pneumonia) 05/18/2019  . Protein-calorie malnutrition, severe ([place]) 05/18/2019  . Sepsis ([place]) 05/08/2019  . History of pulmonary embolism 03/30/2019  . Pure hypercholesterolemia 03/30/2019  . SOB (shortness of breath) 03/30/2019  . Palliative care by specialist   . DNR (do not resuscitate) discussion   . Poor appetite 01/05/2019  . Goals of care, counseling/discussion 12/28/2018  . Encounter for antineoplastic chemotherapy 12/28/2018  . Stage III squamous cell carcinoma of right lung ([place]) 12/28/2018  . Dementia without behavioral disturbance ([place]) 12/16/2018  . Lung mass 12/15/2018  . Pelvic mass in male 12/15/2018  . Postobstructive pneumonia 12/15/2018  . Hypertension     Past Surgical History:  Procedure Laterality Date  . IR GASTROSTOMY TUBE MOD SED  05/25/2019  . IR GASTROSTOMY TUBE REMOVAL  09/08/2019  . JOINT REPLACEMENT    . VIDEO BRONCHOSCOPY WITH ENDOBRONCHIAL ULTRASOUND N/A 12/16/2018   Procedure: VIDEO BRONCHOSCOPY WITH ENDOBRONCHIAL ULTRASOUND AND FLUROSCOPY;  Surgeon: [name], MD;  Location: [facility];  Service: Pulmonary;  Laterality: N/A;       Family History  Problem Relation Age of Onset  . Stomach cancer Mother   . COPD Father   . Lung cancer Brother   . Lung cancer Brother     Social History   Tobacco Use  . Smoking status: Former Smoker  Packs/day: 1.00    Years: 60.00    Pack years: 60.00    Types: Cigarettes  . Smokeless tobacco: Never Used  Vaping Use  . Vaping Use: Never used  Substance Use Topics  . Alcohol use: Yes    Comment: occasional  . Drug use: No    Home Medications Prior to Admission medications   Medication Sig Start Date End Date Taking? Authorizing Provider  bisacodyl (DULCOLAX) 5 MG EC tablet Take 1 tablet (5 mg total) by mouth daily as needed for up to 30 doses  for moderate constipation. 11/05/19   Antonieta Pert, MD  ferrous sulfate 325 (65 FE) MG tablet Take 1 tablet (325 mg total) by mouth daily with breakfast. 09/27/19   Regalado, Belkys A, MD  Multiple Vitamin (MULTIVITAMIN) tablet Take 1 tablet by mouth daily.    [provider]  pantoprazole (PROTONIX) 40 MG tablet Take 1 tablet (40 mg total) by mouth daily. 09/28/19   Regalado, Belkys A, MD  polyethylene glycol (MIRALAX / GLYCOLAX) 17 g packet Take 17 g by mouth 2 (two) times daily. 09/27/19   Regalado, Belkys A, MD  polyethylene glycol powder (GLYCOLAX/MIRALAX) 17 GM/SCOOP powder DISSOLVE 17 GRAMS IN WATER OR OTHER BEVERAGE AND TAKE BY MOUTH TWICE DAILY 01/02/20   Curt Bears, MD  STOOL SOFTENER/LAXATIVE 50-8.6 MG tablet TAKE 1 TABLET BY MOUTH TWICE DAILY 01/02/20   Curt Bears, MD  XARELTO 20 MG TABS tablet TAKE 1 TABLET BY MOUTH DAILY WITH SUPPER 01/24/20   Curt Bears, MD    Allergies    Patient has no known allergies.  Review of Systems   Review of Systems  Unable to perform ROS: Dementia    Physical Exam Updated Vital Signs BP 116/77   Pulse (!) 103   Temp 98.9 F (37.2 C) (Oral)   Resp 16   SpO2 99%   Physical Exam Vitals and nursing note reviewed. Exam conducted with a chaperone present.  Constitutional:      Appearance: Normal appearance.  HENT:     Head: Normocephalic and atraumatic.  Eyes:     General: No scleral icterus.    Conjunctiva/sclera: Conjunctivae normal.  Cardiovascular:     Rate and Rhythm: Regular rhythm. Tachycardia present.     Pulses: Normal pulses.     Heart sounds: Normal heart sounds.  Pulmonary:     Comments: Mild increased work of breathing.  No distress.  Chest rises symmetric.  Breath sounds intact bilaterally.  No wheezing, rales, or stridor appreciated.  No accessory muscle use.  Intermittent tachypnea. Skin:    General: Skin is dry.  Neurological:     General: No focal deficit present.     Mental Status: He is alert and  oriented to person, place, and time.     GCS: GCS eye subscore is 4. GCS verbal subscore is 5. GCS motor subscore is 6.     Cranial Nerves: No cranial nerve deficit.     Sensory: No sensory deficit.  Psychiatric:        Mood and Affect: Mood normal.        Behavior: Behavior normal.        Thought Content: Thought content normal.     ED Results / Procedures / Treatments   Labs (all labs ordered are listed, but only abnormal results are displayed) Labs Reviewed  COMPREHENSIVE METABOLIC PANEL - Abnormal; Notable for the following components:      Result Value   Glucose, Bld 117 (*)  All other components within normal limits  CBC WITH DIFFERENTIAL/PLATELET - Abnormal; Notable for the following components:   RBC 5.99 (*)    MCV 67.9 (*)    MCH 22.5 (*)    RDW 18.1 (*)    All other components within normal limits  PROTIME-INR - Abnormal; Notable for the following components:   Prothrombin Time 19.7 (*)    INR 1.7 (*)    All other components within normal limits  APTT - Abnormal; Notable for the following components:   aPTT 44 (*)    All other components within normal limits  BLOOD GAS, VENOUS - Abnormal; Notable for the following components:   pO2, Ven <31.0 (*)    Bicarbonate 28.4 (*)    Acid-Base Excess 2.6 (*)    All other components within normal limits  CULTURE, BLOOD (SINGLE)  URINE CULTURE  SARS CORONAVIRUS 2 BY RT PCR (HOSPITAL ORDER, [facility])  LACTIC ACID, PLASMA  BRAIN NATRIURETIC PEPTIDE  URINALYSIS, ROUTINE W REFLEX MICROSCOPIC  TROPONIN I (HIGH SENSITIVITY)  TROPONIN I (HIGH SENSITIVITY)    EKG EKG Interpretation  Date/Time:  Tuesday January 31 2020 20:46:42 EDT Ventricular Rate:  108 PR Interval:    QRS Duration: 86 QT Interval:  325 QTC Calculation: 436 R Axis:   62 Text Interpretation: Sinus tachycardia Baseline wander in lead(s) V1 No STEMI Confirmed by Octaviano Glow [phone]) on 01/31/2020 8:49:32  PM   Radiology CT Angio Chest PE W/Cm &/Or Wo Cm  Result Date: 01/31/2020 CLINICAL DATA:  79 year old male with abdominal pain as well as tachycardia and tachypnea. Concern for pulmonary embolism. History of non-small cell lung cancer EXAM: CT ANGIOGRAPHY CHEST CT ABDOMEN AND PELVIS WITH CONTRAST TECHNIQUE: Multidetector CT imaging of the chest was performed using the standard protocol during bolus administration of intravenous contrast. Multiplanar CT image reconstructions and MIPs were obtained to evaluate the vascular anatomy. Multidetector CT imaging of the abdomen and pelvis was performed using the standard protocol during bolus administration of intravenous contrast. CONTRAST:  154mL OMNIPAQUE IOHEXOL 350 MG/ML SOLN COMPARISON:  CT chest dated 12/02/2019 and abdomen pelvis dated 01/30/2019. FINDINGS: Evaluation of this exam is limited due to respiratory motion artifact. CTA CHEST FINDINGS Cardiovascular: There is no cardiomegaly or pericardial effusion. There is coronary vascular calcification of the RCA. There is mild atherosclerotic calcification of the aortic arch. No aneurysm or dissection. Evaluation of the pulmonary arteries is limited due to respiratory motion artifact. No central pulmonary artery embolus identified. Mediastinum/Nodes: No hilar or mediastinal adenopathy. The esophagus and the thyroid gland are grossly unremarkable. No mediastinal fluid collection. Lungs/Pleura: Background of moderate to severe emphysema. Similar appearance of right perihilar scarring and right middle lobe consolidation and volume loss with air bronchograms and mild bronchiectasis, likely post radiation changes. Diffuse interstitial thickening involving the right lung base and inferior aspect of the right upper lobe again noted, likely chronic and related to post radiation changes. There is also areas of nodularity in this region which is similar to prior CT. A small right pleural effusion is again noted. No new  consolidative changes. No pneumothorax. The central airways remain patent. A 9 mm nodular density in the upper trachea (59/3) is similar to prior CT. Although this may represent an adherent mucus debris, an endotracheal lesion is not entirely excluded. The central airways are patent. Musculoskeletal: There is osteopenia with degenerative changes of the spine. No acute osseous pathology. Review of the MIP images confirms the above findings. CT ABDOMEN  and PELVIS FINDINGS No intra-abdominal free air or free fluid. Hepatobiliary: The liver is unremarkable. No intrahepatic biliary ductal dilatation. The gallbladder is unremarkable. Pancreas: Unremarkable. No pancreatic ductal dilatation or surrounding inflammatory changes. Spleen: Normal in size without focal abnormality. Adrenals/Urinary Tract: The adrenal glands are unremarkable. There is no hydronephrosis on either side. There is symmetric enhancement and excretion of contrast by both kidneys. The visualized ureters appear unremarkable. There is diffuse thickened and trabeculated appearance of the bladder wall, likely related to chronic bladder outlet obstruction. Correlation with urinalysis recommended to exclude acute cystitis. Stomach/Bowel: There is no bowel obstruction or active inflammation. The appendix is normal. Vascular/Lymphatic: Advanced aortoiliac atherosclerotic disease. The IVC is grossly unremarkable. No portal venous gas. There is no adenopathy. Reproductive: Enlarged prostate gland with median lobe hypertrophy indenting the base of the bladder. Other: Streak artifact associated with left hip arthroplasty limits evaluation. Musculoskeletal: Osteopenia with degenerative changes of the spine. Left hip arthroplasty. No acute osseous pathology. Review of the MIP images confirms the above findings. IMPRESSION: 1. No acute intrathoracic, abdominal, or pelvic pathology. No CT evidence of central pulmonary artery embolus. 2. Stable post radiation changes of  the right lung and stable small right pleural effusion. 3. A 9 mm nodular density in the upper trachea is similar to prior CT. Although this may represent an adherent mucus debris, an endotracheal lesion is not entirely excluded. 4. Enlarged prostate gland with median lobe hypertrophy indenting the base of the bladder. 5. Thickened and trabeculated appearance of the bladder wall likely related to chronic bladder outlet obstruction. Correlation with urinalysis recommended to exclude acute cystitis. 6. Aortic Atherosclerosis (ICD10-I70.0) and Emphysema (ICD10-J43.9). Electronically Signed   By: Anner Crete M.D.   On: 01/31/2020 22:47   CT Abdomen Pelvis W Contrast  Result Date: 01/31/2020 CLINICAL DATA:  79 year old male with abdominal pain as well as tachycardia and tachypnea. Concern for pulmonary embolism. History of non-small cell lung cancer EXAM: CT ANGIOGRAPHY CHEST CT ABDOMEN AND PELVIS WITH CONTRAST TECHNIQUE: Multidetector CT imaging of the chest was performed using the standard protocol during bolus administration of intravenous contrast. Multiplanar CT image reconstructions and MIPs were obtained to evaluate the vascular anatomy. Multidetector CT imaging of the abdomen and pelvis was performed using the standard protocol during bolus administration of intravenous contrast. CONTRAST:  129mL OMNIPAQUE IOHEXOL 350 MG/ML SOLN COMPARISON:  CT chest dated 12/02/2019 and abdomen pelvis dated 01/30/2019. FINDINGS: Evaluation of this exam is limited due to respiratory motion artifact. CTA CHEST FINDINGS Cardiovascular: There is no cardiomegaly or pericardial effusion. There is coronary vascular calcification of the RCA. There is mild atherosclerotic calcification of the aortic arch. No aneurysm or dissection. Evaluation of the pulmonary arteries is limited due to respiratory motion artifact. No central pulmonary artery embolus identified. Mediastinum/Nodes: No hilar or mediastinal adenopathy. The esophagus  and the thyroid gland are grossly unremarkable. No mediastinal fluid collection. Lungs/Pleura: Background of moderate to severe emphysema. Similar appearance of right perihilar scarring and right middle lobe consolidation and volume loss with air bronchograms and mild bronchiectasis, likely post radiation changes. Diffuse interstitial thickening involving the right lung base and inferior aspect of the right upper lobe again noted, likely chronic and related to post radiation changes. There is also areas of nodularity in this region which is similar to prior CT. A small right pleural effusion is again noted. No new consolidative changes. No pneumothorax. The central airways remain patent. A 9 mm nodular density in the upper trachea (59/3) is similar to  prior CT. Although this may represent an adherent mucus debris, an endotracheal lesion is not entirely excluded. The central airways are patent. Musculoskeletal: There is osteopenia with degenerative changes of the spine. No acute osseous pathology. Review of the MIP images confirms the above findings. CT ABDOMEN and PELVIS FINDINGS No intra-abdominal free air or free fluid. Hepatobiliary: The liver is unremarkable. No intrahepatic biliary ductal dilatation. The gallbladder is unremarkable. Pancreas: Unremarkable. No pancreatic ductal dilatation or surrounding inflammatory changes. Spleen: Normal in size without focal abnormality. Adrenals/Urinary Tract: The adrenal glands are unremarkable. There is no hydronephrosis on either side. There is symmetric enhancement and excretion of contrast by both kidneys. The visualized ureters appear unremarkable. There is diffuse thickened and trabeculated appearance of the bladder wall, likely related to chronic bladder outlet obstruction. Correlation with urinalysis recommended to exclude acute cystitis. Stomach/Bowel: There is no bowel obstruction or active inflammation. The appendix is normal. Vascular/Lymphatic: Advanced  aortoiliac atherosclerotic disease. The IVC is grossly unremarkable. No portal venous gas. There is no adenopathy. Reproductive: Enlarged prostate gland with median lobe hypertrophy indenting the base of the bladder. Other: Streak artifact associated with left hip arthroplasty limits evaluation. Musculoskeletal: Osteopenia with degenerative changes of the spine. Left hip arthroplasty. No acute osseous pathology. Review of the MIP images confirms the above findings. IMPRESSION: 1. No acute intrathoracic, abdominal, or pelvic pathology. No CT evidence of central pulmonary artery embolus. 2. Stable post radiation changes of the right lung and stable small right pleural effusion. 3. A 9 mm nodular density in the upper trachea is similar to prior CT. Although this may represent an adherent mucus debris, an endotracheal lesion is not entirely excluded. 4. Enlarged prostate gland with median lobe hypertrophy indenting the base of the bladder. 5. Thickened and trabeculated appearance of the bladder wall likely related to chronic bladder outlet obstruction. Correlation with urinalysis recommended to exclude acute cystitis. 6. Aortic Atherosclerosis (ICD10-I70.0) and Emphysema (ICD10-J43.9). Electronically Signed   By: Anner Crete M.D.   On: 01/31/2020 22:47   DG Chest Port 1 View  Result Date: 01/31/2020 CLINICAL DATA:  Question sepsis. EXAM: PORTABLE CHEST 1 VIEW COMPARISON:  Chest CT 12/02/2019.  Radiograph 11/04/2019 FINDINGS: Chronic volume loss in the right hemithorax with rightward mediastinal shift. Right pleural effusion is similar to prior exam, not well assessed on this AP. Patchy opacity in the right lower lung zone combination of airspace disease and chronic right middle lobe bronchiectasis/volume loss on CT. The left lung is clear. There is no pneumothorax. No acute osseous abnormalities are seen. IMPRESSION: Chronic volume loss in the right hemithorax with rightward mediastinal shift. Right pleural  effusion is similar to prior exam. Patchy opacity in the right mid and lower lung zone corresponds to airspace disease and chronic right middle lobe opacity on CT 2 months ago. Electronically Signed   By: Keith Rake M.D.   On: 01/31/2020 21:15    Procedures Procedures (including critical care time)  Medications Ordered in ED Medications  iohexol (OMNIPAQUE) 350 MG/ML injection 100 mL (100 mLs Intravenous Contrast Given 01/31/20 2211)    ED Course  I have reviewed the triage vital signs and the nursing notes.  Pertinent labs & imaging results that were available during my care of the patient were reviewed by me and considered in my medical decision making (see chart for details).    MDM Rules/Calculators/A&P  Patient presented to the ED primarily for abdominal pain symptoms.  He was found to be in tachypneic, but that has seemingly improved and he is in no distress.  He is oxygenating well on room air.  He is tachycardic here in the ED, but that is consistent with his prior exams.  While patient's primary complaint is abdominal discomfort, I obtained history from daughter who reports that he has been eating and drinking well and having regular bowel movements (albeit dark presumably due to iron supplements).  Comprehensive laboratory work-up and imaging was obtained and all reassuring.  No leukocytosis concerning for systemic illness.  Lactic acid WNL and does not need to be trended.  Initial troponin of 5, also does not need to be trended.  CMP without derangement.  CT imaging of abdomen pelvis with contrast as well as a PE study was obtained and negative for any acute or emergent pathology.  On subsequent evaluation, patient continues to be mildly confused, likely consistent with baseline dementia.  However, given that daughter feels as though he is more confused than normal, we will still wait for UA.    If UA does not show large infection, patient will be  reasonable for discharge with outpatient follow-up.  Patient was personally evaluated by Dr. Langston Masker who agrees with assessment and plan.  At shift change care was transferred to Quincy Carnes, PA-C who will follow pending studies, re-evaluate, and determine disposition.     Final Clinical Impression(s) / ED Diagnoses Final diagnoses:  Generalized abdominal pain    Rx / DC Orders ED Discharge Orders    None       Corena Herter, PA-C 02/01/20 0019    Wyvonnia Dusky, MD 02/01/20 [id number]    Wyvonnia Dusky, MD 02/01/20 0045

## 2020-01-31 NOTE — ED Triage Notes (Signed)
Pt BIB EMS from home c/o abdominal pain/tenderness in bilateral lower quadrants x 3 days. Family reports increased agitation, work of breathing, and dark stools. Pt on blood thinners. Ems reports pt is tachycardic and tachypneic. Hx of dementia, PE, DVT, and lung cancer.

## 2020-02-01 LAB — URINALYSIS, ROUTINE W REFLEX MICROSCOPIC
Bilirubin Urine: NEGATIVE
Glucose, UA: NEGATIVE mg/dL
Ketones, ur: NEGATIVE mg/dL
Nitrite: POSITIVE — AB
Protein, ur: 100 mg/dL — AB
RBC / HPF: >50 RBC/hpf — ABNORMAL HIGH (ref 0–5)
Specific Gravity, Urine: 1.044 — ABNORMAL HIGH (ref 1.005–1.030)
pH: 9 — ABNORMAL HIGH (ref 5.0–8.0)

## 2020-02-01 LAB — SARS CORONAVIRUS 2 BY RT PCR (HOSPITAL ORDER, PERFORMED IN ~~LOC~~ HOSPITAL LAB): SARS Coronavirus 2: NEGATIVE

## 2020-02-01 MED ORDER — CEPHALEXIN 500 MG PO CAPS
500.0000 mg | ORAL_CAPSULE | Freq: Two times a day (BID) | ORAL | 0 refills | Status: DC
Start: 2020-02-01 — End: 2020-02-17

## 2020-02-01 NOTE — Discharge Instructions (Addendum)
Your comprehensive work-up today was entirely reassuring.  I spoke with your daughter who reports that you have been eating and drinking regularly and having normal bowel movements, albeit darker (likely due to iron supplement).  There is nothing that we have found here today to suggest obstruction or infection.  While you had elevated heart rate and respiratory rate here in the ED, your EKG, cardiac work-up, and CT of the chest evaluating for pulmonary embolism was negative.  Please follow-up with your primary care provider as well as with your oncologist regarding today's encounter.  Return to the ED or seek immediate medical attention should you develop any new or worsening symptoms.

## 2020-02-01 NOTE — ED Provider Notes (Signed)
Assumed care from PA Green at change of shift.  See prior notes for care up to this point.  Plan: ready for discharge aside from UA and covid test.  Daughter has already been called and will come pick patient up.  12:49 AM UA with positive nitrites, rare bacteria, 6-10 WBC, moderate leuks.  Given findings of questionable cystitis on CT, will place on course of Keflex pending urine culture.  He has a normal lactate, normal white count, no fever does not appear to have clinical signs of sepsis at this time.  VSS.  Covid test is still in process, however daughter is already at the bedside and is ready for patient to be discharged since she had to arrange for a ride from friend.  They will be notified by phone Covid test is positive.   Larene Pickett, PA-C 02/01/20 0129    Ripley Fraise, MD 02/01/20 0600

## 2020-02-03 LAB — URINE CULTURE: Culture: >=100000 — AB

## 2020-02-04 ENCOUNTER — Telehealth (HOSPITAL_BASED_OUTPATIENT_CLINIC_OR_DEPARTMENT_OTHER): Payer: Self-pay | Admitting: Emergency Medicine

## 2020-02-04 NOTE — Telephone Encounter (Signed)
Post ED Visit - Positive Culture Follow-up  Culture report reviewed by antimicrobial stewardship pharmacist: [facility] Team []  Elenor Quinones, Pharm.D. []  Heide Guile, Pharm.D., BCPS AQ-ID []  Parks Neptune, Pharm.D., BCPS []  Alycia Rossetti, Pharm.D., BCPS []  Arvin, Pharm.D., BCPS, AAHIVP []  Legrand Como, Pharm.D., BCPS, AAHIVP []  Salome Arnt, PharmD, BCPS []  Johnnette Gourd, PharmD, BCPS []  Hughes Better, PharmD, BCPS []  Leeroy Cha, PharmD []  Laqueta Linden, PharmD, BCPS []  Albertina Parr, PharmD  [facility] Team []  Leodis Sias, PharmD []  Lindell Spar, PharmD []  Royetta Asal, PharmD []  Graylin Shiver, Rph []  Rema Fendt) Glennon Mac, PharmD []  Arlyn Dunning, PharmD []  Netta Cedars, PharmD []  Dia Sitter, PharmD []  Leone Haven, PharmD []  Gretta Arab, PharmD [x]  Theodis Shove, PharmD []  Peggyann Juba, PharmD []  Reuel Boom, PharmD   Positive urine culture Treated with Cephalexin, organism sensitive to the same and no further patient follow-up is required at this time.  Sandi Raveling Dauntae Derusha 02/04/2020, 12:56 PM

## 2020-02-05 LAB — CULTURE, BLOOD (SINGLE): Culture: NO GROWTH

## 2020-02-16 ENCOUNTER — Inpatient Hospital Stay (HOSPITAL_COMMUNITY)
Admission: EM | Admit: 2020-02-16 | Discharge: 2020-02-21 | DRG: 177 | Disposition: A | Discharge status: Home / Self Care | Payer: Medicare HMO | Attending: Student | Admitting: Student

## 2020-02-16 ENCOUNTER — Emergency Department (HOSPITAL_COMMUNITY): Payer: Medicare HMO

## 2020-02-16 DIAGNOSIS — R8271 Bacteriuria: Secondary | ICD-10-CM | POA: Diagnosis present

## 2020-02-16 DIAGNOSIS — R0902 Hypoxemia: Secondary | ICD-10-CM

## 2020-02-16 DIAGNOSIS — J9601 Acute respiratory failure with hypoxia: Secondary | ICD-10-CM | POA: Diagnosis present

## 2020-02-16 DIAGNOSIS — C3491 Malignant neoplasm of unspecified part of right bronchus or lung: Secondary | ICD-10-CM | POA: Diagnosis present

## 2020-02-16 DIAGNOSIS — Z79899 Other long term (current) drug therapy: Secondary | ICD-10-CM

## 2020-02-16 DIAGNOSIS — F0391 Unspecified dementia with behavioral disturbance: Secondary | ICD-10-CM | POA: Diagnosis present

## 2020-02-16 DIAGNOSIS — J069 Acute upper respiratory infection, unspecified: Secondary | ICD-10-CM | POA: Diagnosis present

## 2020-02-16 DIAGNOSIS — Z801 Family history of malignant neoplasm of trachea, bronchus and lung: Secondary | ICD-10-CM

## 2020-02-16 DIAGNOSIS — R52 Pain, unspecified: Secondary | ICD-10-CM | POA: Diagnosis not present

## 2020-02-16 DIAGNOSIS — R32 Unspecified urinary incontinence: Secondary | ICD-10-CM | POA: Diagnosis present

## 2020-02-16 DIAGNOSIS — R8281 Pyuria: Secondary | ICD-10-CM | POA: Diagnosis present

## 2020-02-16 DIAGNOSIS — Z87440 Personal history of urinary (tract) infections: Secondary | ICD-10-CM

## 2020-02-16 DIAGNOSIS — Z86711 Personal history of pulmonary embolism: Secondary | ICD-10-CM

## 2020-02-16 DIAGNOSIS — J80 Acute respiratory distress syndrome: Secondary | ICD-10-CM | POA: Diagnosis not present

## 2020-02-16 DIAGNOSIS — Z96642 Presence of left artificial hip joint: Secondary | ICD-10-CM | POA: Diagnosis present

## 2020-02-16 DIAGNOSIS — E7800 Pure hypercholesterolemia, unspecified: Secondary | ICD-10-CM | POA: Diagnosis present

## 2020-02-16 DIAGNOSIS — R404 Transient alteration of awareness: Secondary | ICD-10-CM | POA: Diagnosis not present

## 2020-02-16 DIAGNOSIS — Z7901 Long term (current) use of anticoagulants: Secondary | ICD-10-CM

## 2020-02-16 DIAGNOSIS — I10 Essential (primary) hypertension: Secondary | ICD-10-CM | POA: Diagnosis present

## 2020-02-16 DIAGNOSIS — R000 Tachycardia, unspecified: Secondary | ICD-10-CM | POA: Diagnosis not present

## 2020-02-16 DIAGNOSIS — J9600 Acute respiratory failure, unspecified whether with hypoxia or hypercapnia: Secondary | ICD-10-CM | POA: Diagnosis present

## 2020-02-16 DIAGNOSIS — U071 COVID-19: Principal | ICD-10-CM | POA: Diagnosis present

## 2020-02-16 DIAGNOSIS — Z87891 Personal history of nicotine dependence: Secondary | ICD-10-CM

## 2020-02-16 DIAGNOSIS — R410 Disorientation, unspecified: Secondary | ICD-10-CM | POA: Diagnosis not present

## 2020-02-16 DIAGNOSIS — R0602 Shortness of breath: Secondary | ICD-10-CM | POA: Diagnosis not present

## 2020-02-16 DIAGNOSIS — F0390 Unspecified dementia without behavioral disturbance: Secondary | ICD-10-CM | POA: Diagnosis present

## 2020-02-16 DIAGNOSIS — I480 Paroxysmal atrial fibrillation: Secondary | ICD-10-CM | POA: Diagnosis present

## 2020-02-16 DIAGNOSIS — Z85118 Personal history of other malignant neoplasm of bronchus and lung: Secondary | ICD-10-CM

## 2020-02-16 MED ORDER — SODIUM CHLORIDE 0.9 % IV BOLUS
1000.0000 mL | Freq: Once | INTRAVENOUS | Status: AC
  Administered 2020-02-17: 1000 mL via INTRAVENOUS

## 2020-02-16 NOTE — ED Triage Notes (Signed)
Pt BIB EMS from home c/o respiratory distress and right leg pain. Reports pt has mild shob at baseline. Worsening SHOB began this AM. Poor oral intake x 1 week. Pt's 02sat low 70s on RA when EMS arrived. Pt now 96% on 10L NRB. Diminished lung sound on left. Denies fever. Reports family has been sick and has pending COVID tests. Hx of CA in right lung, bilateral PE's, and right leg DVT, and dementia. Confused at baseline. Able to answer some questions.

## 2020-02-16 NOTE — ED Provider Notes (Signed)
[facility] DEPT Provider Note: Georgena Spurling, MD, FACEP  CSN: [id number] MRN: [id number] ARRIVAL: 02/16/20 at 2109 ROOM: [place] [facility]  Level 5 Caveat: dementia HISTORY OF PRESENT ILLNESS  02/16/20 10:38 PM Richard [name] is a 79 y.o. male with history of lung cancer and dementia, on Xarelto for thromboembolic disease.  He has baseline shortness of breath but this worsened this morning.  His oxygen saturation was initially in the low 70s on room air when EMS arrived.  He was placed on 10 L by nonrebreather and this improved to 96%.  EMS noted diminished lung sounds on the left. He has not had a known fever.  Although EMS reports the patient was having pain in his right leg the patient denies it now and denies any other pain.  He was seen in the ED on 01/31/2020 and was started on Keflex for urinary tract infection.  Urine culture grew out Proteus mirabilis pansensitive except for intermediate to imipenem and resistant to nitrofurantoin.   Past Medical History:  Diagnosis Date  . Acute pulmonary embolism ([place]) 01/30/2019  . Acute respiratory failure with hypoxia ([place]) 01/30/2019  . AKI (acute kidney injury) ([place]) 01/30/2019  . Atrial fibrillation with RVR ([place])   . Dementia ([place])   . High cholesterol   . Hypertension   . Lung mass 12/2018  . Severe sepsis ([place]) 01/30/2019    Past Surgical History:  Procedure Laterality Date  . IR GASTROSTOMY TUBE MOD SED  05/25/2019  . IR GASTROSTOMY TUBE REMOVAL  09/08/2019  . JOINT REPLACEMENT    . VIDEO BRONCHOSCOPY WITH ENDOBRONCHIAL ULTRASOUND N/A 12/16/2018   Procedure: VIDEO BRONCHOSCOPY WITH ENDOBRONCHIAL ULTRASOUND AND FLUROSCOPY;  Surgeon: [name], MD;  Location: [facility];  Service: Pulmonary;  Laterality: N/A;    Family History  Problem Relation Age of Onset  . Stomach cancer Mother   . COPD Father   . Lung cancer Brother   . Lung cancer Brother     Social History   Tobacco Use  . Smoking  status: Former Smoker    Packs/day: 1.00    Years: 60.00    Pack years: 60.00    Types: Cigarettes  . Smokeless tobacco: Never Used  Vaping Use  . Vaping Use: Never used  Substance Use Topics  . Alcohol use: Yes    Comment: occasional  . Drug use: No    Prior to Admission medications   Medication Sig Start Date End Date Taking? Authorizing Provider  bisacodyl (DULCOLAX) 5 MG EC tablet Take 1 tablet (5 mg total) by mouth daily as needed for up to 30 doses for moderate constipation. 11/05/19   Antonieta Pert, MD  cephALEXin (KEFLEX) 500 MG capsule Take 1 capsule (500 mg total) by mouth 2 (two) times daily. 02/01/20   Larene Pickett, PA-C  ferrous sulfate 325 (65 FE) MG tablet Take 1 tablet (325 mg total) by mouth daily with breakfast. 09/27/19   Regalado, Belkys A, MD  Multiple Vitamin (MULTIVITAMIN) tablet Take 1 tablet by mouth daily.    [provider]  pantoprazole (PROTONIX) 40 MG tablet Take 1 tablet (40 mg total) by mouth daily. 09/28/19   Regalado, Belkys A, MD  polyethylene glycol (MIRALAX / GLYCOLAX) 17 g packet Take 17 g by mouth 2 (two) times daily. 09/27/19   Regalado, Belkys A, MD  polyethylene glycol powder (GLYCOLAX/MIRALAX) 17 GM/SCOOP powder DISSOLVE 17 GRAMS IN WATER OR OTHER BEVERAGE AND TAKE BY MOUTH TWICE  DAILY 01/02/20   Curt Bears, MD  STOOL SOFTENER/LAXATIVE 50-8.6 MG tablet TAKE 1 TABLET BY MOUTH TWICE DAILY 01/02/20   Curt Bears, MD  XARELTO 20 MG TABS tablet TAKE 1 TABLET BY MOUTH DAILY WITH SUPPER 01/24/20   Curt Bears, MD    Allergies Patient has no known allergies.   REVIEW OF SYSTEMS     PHYSICAL EXAMINATION  Initial Vital Signs Blood pressure 128/70, pulse (!) 129, temperature 98.3 F (36.8 C), temperature source Oral, resp. rate (!) 25, height 6' (1.829 m), weight 72.6 kg, SpO2 98 %.  Examination General: Well-developed, cachectic male in no acute distress; appearance consistent with age of record HENT: normocephalic;  atraumatic Eyes: pupils equal, round and reactive to light; extraocular muscles grossly intact; arcus senilis bilaterally Neck: supple Heart: regular rate and rhythm; tachycardia Lungs: Tachypnea; shallow breaths Abdomen: soft; nondistended; nontender;  bowel sounds present Extremities: No acute deformity; full range of motion; pulses normal Neurologic: Awake, alert and oriented x 2; motor function intact in all extremities and symmetric; no facial droop Skin: Warm and dry Psychiatric: Normal mood and affect   RESULTS  Summary of this visit's results, reviewed and interpreted by myself:   EKG Interpretation  Date/Time:  Thursday February 16 2020 23:04:32 EDT Ventricular Rate:  110 PR Interval:    QRS Duration: 77 QT Interval:  313 QTC Calculation: 424 R Axis:   80 Text Interpretation: Sinus tachycardia Probable left atrial enlargement Confirmed by Pattricia Richard [phone]) on 02/16/2020 11:55:10 PM      Laboratory Studies: Results for orders placed or performed during the hospital encounter of 02/16/20 (from the past 24 hour(s))  SARS Coronavirus 2 by RT PCR (hospital order, performed in [facility] hospital lab) Nasopharyngeal Nasopharyngeal Swab     Status: Abnormal   Collection Time: 02/16/20 10:41 PM   Specimen: Nasopharyngeal Swab  Result Value Ref Range   SARS Coronavirus 2 POSITIVE (A) NEGATIVE  CBC with Differential/Platelet     Status: Abnormal   Collection Time: 02/16/20 11:45 PM  Result Value Ref Range   WBC 6.8 4.0 - 10.5 K/uL   RBC 6.20 (H) 4.22 - 5.81 MIL/uL   Hemoglobin 13.9 13.0 - 17.0 g/dL   HCT 42.0 39 - 52 %   MCV 67.7 (L) 80.0 - 100.0 fL   MCH 22.4 (L) 26.0 - 34.0 pg   MCHC 33.1 30.0 - 36.0 g/dL   RDW 18.2 (H) 11.5 - 15.5 %   Platelets 267 150 - 400 K/uL   nRBC 0 0.0 - 0.2 %   Neutrophils Relative % 43 %   Neutro Abs 2.9 1.7 - 7.7 K/uL   Lymphocytes Relative 39 %   Lymphs Abs 2.7 0.7 - 4.0 K/uL   Monocytes Relative 16 %   Monocytes Absolute 1.1 (H)  0 - 1 K/uL   Eosinophils Relative 0 %   Eosinophils Absolute 0.0 0 - 0 K/uL   Basophils Relative 0 %   Basophils Absolute 0.0 0 - 0 K/uL   RBC Morphology TARGET CELLS    nRBC 0 0 /100 WBC  Basic metabolic panel     Status: Abnormal   Collection Time: 02/16/20 11:45 PM  Result Value Ref Range   Sodium 143 135 - 145 mmol/L   Potassium 4.4 3.5 - 5.1 mmol/L   Chloride 106 98 - 111 mmol/L   CO2 27 22 - 32 mmol/L   Glucose, Bld 116 (H) 70 - 99 mg/dL   BUN 21 8 - 23  mg/dL   Creatinine, Ser 1.02 0.61 - 1.24 mg/dL   Calcium 9.5 8.9 - 10.3 mg/dL   GFR calc non Af Amer >60 >60 mL/min   GFR calc Af Amer >60 >60 mL/min   Anion gap 10 5 - 15  Lactic acid, plasma     Status: None   Collection Time: 02/16/20 11:45 PM  Result Value Ref Range   Lactic Acid, Venous 1.8 0.5 - 1.9 mmol/L   Imaging Studies: DG Chest Port 1 View  Result Date: 02/16/2020 CLINICAL DATA:  Shortness of breath EXAM: PORTABLE CHEST 1 VIEW COMPARISON:  01/31/2020 FINDINGS: Right-greater-than-left bibasilar opacities, unchanged. Rightward mediastinal shift is also unchanged. No pleural effusion or pneumothorax. IMPRESSION: Unchanged right-greater-than-left bibasilar opacities. Electronically Signed   By: Ulyses Jarred M.D.   On: 02/16/2020 23:10    ED COURSE and MDM  Nursing notes, initial and subsequent vitals signs, including pulse oximetry, reviewed and interpreted by myself.  Vitals:   02/17/20 0000 02/17/20 0015 02/17/20 0100 02/17/20 0245  BP: 115/73 110/72 (!) 109/52 (!) 105/46  Pulse: (!) 108 (!) 108 (!) 110 (!) 101  Resp: (!) 28 (!) 24 (!) 25 19  Temp:      TempSrc:      SpO2: 95% 94% 92% 94%  Weight:      Height:       Medications  dexamethasone (DECADRON) injection 6 mg (has no administration in time range)  sodium chloride 0.9 % bolus 1,000 mL (0 mLs Intravenous Stopped 02/17/20 0220)    12:18 AM Patient removed from nonrebreather.  Oxygen saturation is 95% on room air.  Patient in no distress.  3:21  AM Patient saturation 90% on room air while asleep, improved to 92% with stimulation.  Patient is positive for Covid.  Due to his comorbidities and declining oxygen saturation we will start remdesivir and steroids and have him admitted.  4:02 AM Dr. Hal Hope to admit.  PROCEDURES  Procedures   ED DIAGNOSES     ICD-10-CM   1. COVID-19 virus infection  U07.1   2. Hypoxia  R09.02        Shanon Rosser, MD 02/17/20 0403

## 2020-02-16 NOTE — ED Notes (Signed)
Pt refused blood drawn

## 2020-02-17 ENCOUNTER — Encounter (HOSPITAL_COMMUNITY): Payer: Self-pay | Admitting: Internal Medicine

## 2020-02-17 DIAGNOSIS — E7800 Pure hypercholesterolemia, unspecified: Secondary | ICD-10-CM | POA: Diagnosis present

## 2020-02-17 DIAGNOSIS — J9600 Acute respiratory failure, unspecified whether with hypoxia or hypercapnia: Secondary | ICD-10-CM | POA: Diagnosis present

## 2020-02-17 DIAGNOSIS — F0280 Dementia in other diseases classified elsewhere without behavioral disturbance: Secondary | ICD-10-CM | POA: Diagnosis not present

## 2020-02-17 DIAGNOSIS — Z87891 Personal history of nicotine dependence: Secondary | ICD-10-CM | POA: Diagnosis not present

## 2020-02-17 DIAGNOSIS — Z801 Family history of malignant neoplasm of trachea, bronchus and lung: Secondary | ICD-10-CM | POA: Diagnosis not present

## 2020-02-17 DIAGNOSIS — G309 Alzheimer's disease, unspecified: Secondary | ICD-10-CM | POA: Diagnosis not present

## 2020-02-17 DIAGNOSIS — F0390 Unspecified dementia without behavioral disturbance: Secondary | ICD-10-CM | POA: Diagnosis present

## 2020-02-17 DIAGNOSIS — R0602 Shortness of breath: Secondary | ICD-10-CM | POA: Diagnosis present

## 2020-02-17 DIAGNOSIS — J9601 Acute respiratory failure with hypoxia: Secondary | ICD-10-CM | POA: Diagnosis not present

## 2020-02-17 DIAGNOSIS — Z85118 Personal history of other malignant neoplasm of bronchus and lung: Secondary | ICD-10-CM | POA: Diagnosis not present

## 2020-02-17 DIAGNOSIS — I480 Paroxysmal atrial fibrillation: Secondary | ICD-10-CM

## 2020-02-17 DIAGNOSIS — Z7901 Long term (current) use of anticoagulants: Secondary | ICD-10-CM | POA: Diagnosis not present

## 2020-02-17 DIAGNOSIS — C3491 Malignant neoplasm of unspecified part of right bronchus or lung: Secondary | ICD-10-CM

## 2020-02-17 DIAGNOSIS — Z86711 Personal history of pulmonary embolism: Secondary | ICD-10-CM | POA: Diagnosis not present

## 2020-02-17 DIAGNOSIS — R69 Illness, unspecified: Secondary | ICD-10-CM | POA: Diagnosis not present

## 2020-02-17 DIAGNOSIS — Z79899 Other long term (current) drug therapy: Secondary | ICD-10-CM | POA: Diagnosis not present

## 2020-02-17 DIAGNOSIS — F0391 Unspecified dementia with behavioral disturbance: Secondary | ICD-10-CM

## 2020-02-17 DIAGNOSIS — U071 COVID-19: Secondary | ICD-10-CM | POA: Diagnosis not present

## 2020-02-17 DIAGNOSIS — R8271 Bacteriuria: Secondary | ICD-10-CM | POA: Diagnosis not present

## 2020-02-17 DIAGNOSIS — I10 Essential (primary) hypertension: Secondary | ICD-10-CM | POA: Diagnosis present

## 2020-02-17 DIAGNOSIS — R8281 Pyuria: Secondary | ICD-10-CM | POA: Diagnosis not present

## 2020-02-17 DIAGNOSIS — Z87440 Personal history of urinary (tract) infections: Secondary | ICD-10-CM | POA: Diagnosis not present

## 2020-02-17 DIAGNOSIS — J069 Acute upper respiratory infection, unspecified: Secondary | ICD-10-CM | POA: Diagnosis not present

## 2020-02-17 DIAGNOSIS — R5381 Other malaise: Secondary | ICD-10-CM | POA: Diagnosis not present

## 2020-02-17 DIAGNOSIS — R32 Unspecified urinary incontinence: Secondary | ICD-10-CM | POA: Diagnosis not present

## 2020-02-17 DIAGNOSIS — Z96642 Presence of left artificial hip joint: Secondary | ICD-10-CM | POA: Diagnosis present

## 2020-02-17 LAB — URINALYSIS, ROUTINE W REFLEX MICROSCOPIC
Bilirubin Urine: NEGATIVE
Glucose, UA: NEGATIVE mg/dL
Hgb urine dipstick: NEGATIVE
Ketones, ur: NEGATIVE mg/dL
Nitrite: POSITIVE — AB
Protein, ur: 100 mg/dL — AB
Specific Gravity, Urine: 1.023 (ref 1.005–1.030)
WBC, UA: >50 WBC/hpf — ABNORMAL HIGH (ref 0–5)
pH: 8 (ref 5.0–8.0)

## 2020-02-17 LAB — CBC WITH DIFFERENTIAL/PLATELET
Abs Immature Granulocytes: 0 10*3/uL (ref 0.00–0.07)
Basophils Absolute: 0 10*3/uL (ref 0.0–0.1)
Basophils Absolute: 0 10*3/uL (ref 0.0–0.1)
Basophils Relative: 0 %
Basophils Relative: 1 %
Eosinophils Absolute: 0 10*3/uL (ref 0.0–0.5)
Eosinophils Absolute: 0 10*3/uL (ref 0.0–0.5)
Eosinophils Relative: 0 %
Eosinophils Relative: 1 %
HCT: 42 % (ref 39.0–52.0)
HCT: 38.3 % — ABNORMAL LOW (ref 39.0–52.0)
Hemoglobin: 13.9 g/dL (ref 13.0–17.0)
Hemoglobin: 12.7 g/dL — ABNORMAL LOW (ref 13.0–17.0)
Immature Granulocytes: 0 %
Lymphocytes Relative: 39 %
Lymphocytes Relative: 39 %
Lymphs Abs: 2.7 10*3/uL (ref 0.7–4.0)
Lymphs Abs: 2 10*3/uL (ref 0.7–4.0)
MCH: 22.4 pg — ABNORMAL LOW (ref 26.0–34.0)
MCH: 22.4 pg — ABNORMAL LOW (ref 26.0–34.0)
MCHC: 33.1 g/dL (ref 30.0–36.0)
MCHC: 33.2 g/dL (ref 30.0–36.0)
MCV: 67.7 fL — ABNORMAL LOW (ref 80.0–100.0)
MCV: 67.7 fL — ABNORMAL LOW (ref 80.0–100.0)
Monocytes Absolute: 1.1 10*3/uL — ABNORMAL HIGH (ref 0.1–1.0)
Monocytes Absolute: 0.8 10*3/uL (ref 0.1–1.0)
Monocytes Relative: 16 %
Monocytes Relative: 16 %
Neutro Abs: 2.9 10*3/uL (ref 1.7–7.7)
Neutro Abs: 2.2 10*3/uL (ref 1.7–7.7)
Neutrophils Relative %: 43 %
Neutrophils Relative %: 43 %
Platelets: 267 10*3/uL (ref 150–400)
Platelets: 237 10*3/uL (ref 150–400)
RBC: 6.2 MIL/uL — ABNORMAL HIGH (ref 4.22–5.81)
RBC: 5.66 MIL/uL (ref 4.22–5.81)
RDW: 18.2 % — ABNORMAL HIGH (ref 11.5–15.5)
RDW: 17 % — ABNORMAL HIGH (ref 11.5–15.5)
WBC: 6.8 10*3/uL (ref 4.0–10.5)
WBC: 5.1 10*3/uL (ref 4.0–10.5)
nRBC: 0 % (ref 0.0–0.2)
nRBC: 0 /100 WBC
nRBC: 0 % (ref 0.0–0.2)

## 2020-02-17 LAB — COMPREHENSIVE METABOLIC PANEL
ALT: 24 U/L (ref 0–44)
AST: 33 U/L (ref 15–41)
Albumin: 3.2 g/dL — ABNORMAL LOW (ref 3.5–5.0)
Alkaline Phosphatase: 81 U/L (ref 38–126)
Anion gap: 9 (ref 5–15)
BUN: 18 mg/dL (ref 8–23)
CO2: 22 mmol/L (ref 22–32)
Calcium: 8.7 mg/dL — ABNORMAL LOW (ref 8.9–10.3)
Chloride: 109 mmol/L (ref 98–111)
Creatinine, Ser: 0.77 mg/dL (ref 0.61–1.24)
GFR calc Af Amer: >60 mL/min (ref >60)
GFR calc non Af Amer: >60 mL/min (ref >60)
Glucose, Bld: 110 mg/dL — ABNORMAL HIGH (ref 70–99)
Potassium: 4 mmol/L (ref 3.5–5.1)
Sodium: 140 mmol/L (ref 135–145)
Total Bilirubin: 0.3 mg/dL (ref 0.3–1.2)
Total Protein: 6 g/dL — ABNORMAL LOW (ref 6.5–8.1)

## 2020-02-17 LAB — BASIC METABOLIC PANEL
Anion gap: 10 (ref 5–15)
BUN: 21 mg/dL (ref 8–23)
CO2: 27 mmol/L (ref 22–32)
Calcium: 9.5 mg/dL (ref 8.9–10.3)
Chloride: 106 mmol/L (ref 98–111)
Creatinine, Ser: 1.02 mg/dL (ref 0.61–1.24)
GFR calc Af Amer: >60 mL/min (ref >60)
GFR calc non Af Amer: >60 mL/min (ref >60)
Glucose, Bld: 116 mg/dL — ABNORMAL HIGH (ref 70–99)
Potassium: 4.4 mmol/L (ref 3.5–5.1)
Sodium: 143 mmol/L (ref 135–145)

## 2020-02-17 LAB — CBG MONITORING, ED: Glucose-Capillary: 167 mg/dL — ABNORMAL HIGH (ref 70–99)

## 2020-02-17 LAB — TROPONIN I (HIGH SENSITIVITY)
Troponin I (High Sensitivity): 8 ng/L (ref <18)
Troponin I (High Sensitivity): 5 ng/L (ref <18)

## 2020-02-17 LAB — D-DIMER, QUANTITATIVE: D-Dimer, Quant: 0.97 ug/mL-FEU — ABNORMAL HIGH (ref 0.00–0.50)

## 2020-02-17 LAB — LACTIC ACID, PLASMA: Lactic Acid, Venous: 1.8 mmol/L (ref 0.5–1.9)

## 2020-02-17 LAB — ABO/RH: ABO/RH(D): AB POS

## 2020-02-17 LAB — C-REACTIVE PROTEIN: CRP: 2.5 mg/dL — ABNORMAL HIGH (ref <1.0)

## 2020-02-17 LAB — SARS CORONAVIRUS 2 BY RT PCR (HOSPITAL ORDER, PERFORMED IN ~~LOC~~ HOSPITAL LAB): SARS Coronavirus 2: POSITIVE — AB

## 2020-02-17 MED ORDER — HYDROCOD POLST-CPM POLST ER 10-8 MG/5ML PO SUER
5.0000 mL | Freq: Two times a day (BID) | ORAL | Status: DC | PRN
  Administered 2020-02-18: 5 mL via ORAL
  Filled 2020-02-17: qty 5

## 2020-02-17 MED ORDER — POLYETHYLENE GLYCOL 3350 17 G PO PACK: 17.0000 g | PACK | Freq: Two times a day (BID) | ORAL | Status: DC | PRN

## 2020-02-17 MED ORDER — DOCUSATE SODIUM 100 MG PO CAPS
100.0000 mg | ORAL_CAPSULE | Freq: Every day | ORAL | Status: DC
  Administered 2020-02-18 – 2020-02-21 (×4): 100 mg via ORAL
  Filled 2020-02-17 (×4): qty 1

## 2020-02-17 MED ORDER — ZINC SULFATE 220 (50 ZN) MG PO CAPS
220.0000 mg | ORAL_CAPSULE | Freq: Every day | ORAL | Status: DC
  Administered 2020-02-17 – 2020-02-21 (×5): 220 mg via ORAL
  Filled 2020-02-17 (×5): qty 1

## 2020-02-17 MED ORDER — DEXAMETHASONE SODIUM PHOSPHATE 10 MG/ML IJ SOLN
6.0000 mg | Freq: Once | INTRAMUSCULAR | Status: AC
  Administered 2020-02-17: 6 mg via INTRAVENOUS
  Filled 2020-02-17: qty 1

## 2020-02-17 MED ORDER — BISACODYL 5 MG PO TBEC: 5.0000 mg | DELAYED_RELEASE_TABLET | Freq: Every day | ORAL | Status: DC | PRN

## 2020-02-17 MED ORDER — PANTOPRAZOLE SODIUM 40 MG PO TBEC
40.0000 mg | DELAYED_RELEASE_TABLET | Freq: Every day | ORAL | Status: DC
  Administered 2020-02-17 – 2020-02-21 (×5): 40 mg via ORAL
  Filled 2020-02-17 (×5): qty 1

## 2020-02-17 MED ORDER — ASCORBIC ACID 500 MG PO TABS
500.0000 mg | ORAL_TABLET | Freq: Every day | ORAL | Status: DC
  Administered 2020-02-17 – 2020-02-21 (×5): 500 mg via ORAL
  Filled 2020-02-17 (×5): qty 1

## 2020-02-17 MED ORDER — POLYETHYLENE GLYCOL 3350 17 G PO PACK
17.0000 g | PACK | Freq: Two times a day (BID) | ORAL | Status: DC
  Administered 2020-02-17: 17 g via ORAL

## 2020-02-17 MED ORDER — SODIUM CHLORIDE 0.9 % IV SOLN
100.0000 mg | Freq: Every day | INTRAVENOUS | Status: DC
  Administered 2020-02-18 – 2020-02-20 (×3): 100 mg via INTRAVENOUS
  Filled 2020-02-17 (×3): qty 20

## 2020-02-17 MED ORDER — SENNOSIDES-DOCUSATE SODIUM 8.6-50 MG PO TABS: 1.0000 | ORAL_TABLET | Freq: Two times a day (BID) | ORAL | Status: DC | PRN

## 2020-02-17 MED ORDER — SODIUM CHLORIDE 0.9 % IV SOLN
200.0000 mg | Freq: Once | INTRAVENOUS | Status: AC
  Administered 2020-02-17: 200 mg via INTRAVENOUS
  Filled 2020-02-17: qty 200

## 2020-02-17 MED ORDER — RIVAROXABAN 20 MG PO TABS
20.0000 mg | ORAL_TABLET | Freq: Every day | ORAL | Status: DC
  Administered 2020-02-17 – 2020-02-20 (×4): 20 mg via ORAL
  Filled 2020-02-17 (×5): qty 1

## 2020-02-17 MED ORDER — METHYLPREDNISOLONE SODIUM SUCC 40 MG IJ SOLR
0.5000 mg/kg | Freq: Two times a day (BID) | INTRAMUSCULAR | Status: DC
  Administered 2020-02-17 – 2020-02-20 (×6): 36.4 mg via INTRAVENOUS
  Filled 2020-02-17 (×7): qty 1

## 2020-02-17 MED ORDER — ONDANSETRON HCL 4 MG PO TABS
4.0000 mg | ORAL_TABLET | Freq: Four times a day (QID) | ORAL | Status: DC | PRN
  Administered 2020-02-17: 4 mg via ORAL
  Filled 2020-02-17: qty 1

## 2020-02-17 MED ORDER — FERROUS SULFATE 325 (65 FE) MG PO TABS
325.0000 mg | ORAL_TABLET | Freq: Every day | ORAL | Status: DC
  Administered 2020-02-17 – 2020-02-21 (×5): 325 mg via ORAL
  Filled 2020-02-17 (×5): qty 1

## 2020-02-17 MED ORDER — ONDANSETRON HCL 4 MG/2ML IJ SOLN
4.0000 mg | Freq: Four times a day (QID) | INTRAMUSCULAR | Status: DC | PRN
  Filled 2020-02-17: qty 2

## 2020-02-17 MED ORDER — GUAIFENESIN-DM 100-10 MG/5ML PO SYRP
10.0000 mL | ORAL_SOLUTION | ORAL | Status: DC | PRN
  Administered 2020-02-17: 10 mL via ORAL
  Filled 2020-02-17: qty 10

## 2020-02-17 NOTE — H&P (Signed)
History and Physical    Richard [name] VEH:[id number] DOB: May 18, 1941 DOA: 02/16/2020  PCP: [name], NP  Patient coming from: Home.  History obtained from patient's daughter.  Patient has dementia.  Chief Complaint: Shortness of breath.  HPI: Richard [name] is a 79 y.o. male with history of lung cancer being followed by Dr. Julien Nordmann, paroxysmal atrial fibrillation with history of PE on Xarelto, dementia was found to be increasingly short of breath of the last 24 hours.  EMS was called and patient was brought to the ER.  As per the patient's daughter patient did not have any nausea vomiting or diarrhea but did complain of some chest tightness.  Per patient's family patient's daughter and granddaughter was sick at home.  But they tested negative for Covid.   ED Course: In the ER initially patient was requiring nonrebreather was slowly weaned off to 2 L.  Chest x-ray was unremarkable EKG shows sinus tachycardia and basic labs are largely unremarkable and inflammatory markers are pending.  Patient was started on remdesivir and Decadron.  Review of Systems: As per HPI, rest all negative.   Past Medical History:  Diagnosis Date  . Acute pulmonary embolism ([place]) 01/30/2019  . Acute respiratory failure with hypoxia ([place]) 01/30/2019  . AKI (acute kidney injury) ([place]) 01/30/2019  . Atrial fibrillation with RVR ([place])   . Dementia ([employer])   . High cholesterol   . Hypertension   . Lung mass 12/2018  . Severe sepsis ([place]) 01/30/2019    Past Surgical History:  Procedure Laterality Date  . IR GASTROSTOMY TUBE MOD SED  05/25/2019  . IR GASTROSTOMY TUBE REMOVAL  09/08/2019  . JOINT REPLACEMENT    . VIDEO BRONCHOSCOPY WITH ENDOBRONCHIAL ULTRASOUND N/A 12/16/2018   Procedure: VIDEO BRONCHOSCOPY WITH ENDOBRONCHIAL ULTRASOUND AND FLUROSCOPY;  Surgeon: [name], MD;  Location: [facility];  Service: Pulmonary;  Laterality: N/A;     reports that he has quit smoking. His smoking use included  cigarettes. He has a 60.00 pack-year smoking history. He has never used smokeless tobacco. He reports current alcohol use. He reports that he does not use drugs.  No Known Allergies  Family History  Problem Relation Age of Onset  . Stomach cancer Mother   . COPD Father   . Lung cancer Brother   . Lung cancer Brother     Prior to Admission medications   Medication Sig Start Date End Date Taking? Authorizing Provider  bisacodyl (DULCOLAX) 5 MG EC tablet Take 1 tablet (5 mg total) by mouth daily as needed for up to 30 doses for moderate constipation. 11/05/19   Antonieta Pert, MD  cephALEXin (KEFLEX) 500 MG capsule Take 1 capsule (500 mg total) by mouth 2 (two) times daily. 02/01/20   Larene Pickett, PA-C  ferrous sulfate 325 (65 FE) MG tablet Take 1 tablet (325 mg total) by mouth daily with breakfast. 09/27/19   Regalado, Belkys A, MD  Multiple Vitamin (MULTIVITAMIN) tablet Take 1 tablet by mouth daily.    [provider]  pantoprazole (PROTONIX) 40 MG tablet Take 1 tablet (40 mg total) by mouth daily. 09/28/19   Regalado, Belkys A, MD  polyethylene glycol (MIRALAX / GLYCOLAX) 17 g packet Take 17 g by mouth 2 (two) times daily. 09/27/19   Regalado, Belkys A, MD  polyethylene glycol powder (GLYCOLAX/MIRALAX) 17 GM/SCOOP powder DISSOLVE 17 GRAMS IN WATER OR OTHER BEVERAGE AND TAKE BY MOUTH TWICE DAILY 01/02/20   Curt Bears, MD  STOOL SOFTENER/LAXATIVE 50-8.6 MG  tablet TAKE 1 TABLET BY MOUTH TWICE DAILY 01/02/20   Curt Bears, MD  XARELTO 20 MG TABS tablet TAKE 1 TABLET BY MOUTH DAILY WITH SUPPER 01/24/20   Curt Bears, MD    Physical Exam: Constitutional: Moderately built and nourished. Vitals:   02/17/20 0100 02/17/20 0245 02/17/20 0415 02/17/20 0445  BP: (!) 109/52 (!) 105/46 105/64 102/60  Pulse: (!) 110 (!) 101 94   Resp: (!) 25 19 18 18   Temp:      TempSrc:      SpO2: 92% 94% 92%   Weight:      Height:       Eyes: Anicteric no pallor. ENMT: No discharge from the  ears eyes nose or mouth. Neck: No mass felt.  No neck rigidity. Respiratory: No rhonchi or crepitations. Cardiovascular: S1-S2 heard. Abdomen: Soft nontender bowel sounds present. Musculoskeletal: No edema. Skin: No rash. Neurologic: Alert awake oriented to his name.  Moves all extremities. Psychiatric: Has dementia.   Labs on Admission: I have personally reviewed following labs and imaging studies  CBC: Recent Labs  Lab 02/16/20 2345  WBC 6.8  NEUTROABS 2.9  HGB 13.9  HCT 42.0  MCV 67.7*  PLT 161   Basic Metabolic Panel: Recent Labs  Lab 02/16/20 2345  NA 143  K 4.4  CL 106  CO2 27  GLUCOSE 116*  BUN 21  CREATININE 1.02  CALCIUM 9.5   GFR: Estimated Creatinine Clearance: 62.3 mL/min (by C-G formula based on SCr of 1.02 mg/dL). Liver Function Tests: No results for input(s): AST, ALT, ALKPHOS, BILITOT, PROT, ALBUMIN in the last 168 hours. No results for input(s): LIPASE, AMYLASE in the last 168 hours. No results for input(s): AMMONIA in the last 168 hours. Coagulation Profile: No results for input(s): INR, PROTIME in the last 168 hours. Cardiac Enzymes: No results for input(s): CKTOTAL, CKMB, CKMBINDEX, TROPONINI in the last 168 hours. BNP (last 3 results) No results for input(s): PROBNP in the last 8760 hours. HbA1C: No results for input(s): HGBA1C in the last 72 hours. CBG: No results for input(s): GLUCAP in the last 168 hours. Lipid Profile: No results for input(s): CHOL, HDL, LDLCALC, TRIG, CHOLHDL, LDLDIRECT in the last 72 hours. Thyroid Function Tests: No results for input(s): TSH, T4TOTAL, FREET4, T3FREE, THYROIDAB in the last 72 hours. Anemia Panel: No results for input(s): VITAMINB12, FOLATE, FERRITIN, TIBC, IRON, RETICCTPCT in the last 72 hours. Urine analysis:    Component Value Date/Time   COLORURINE YELLOW 02/01/2020 0000   APPEARANCEUR CLOUDY (A) 02/01/2020 0000   LABSPEC 1.044 (H) 02/01/2020 0000   PHURINE 9.0 (H) 02/01/2020 0000    GLUCOSEU NEGATIVE 02/01/2020 0000   HGBUR LARGE (A) 02/01/2020 0000   BILIRUBINUR NEGATIVE 02/01/2020 0000   KETONESUR NEGATIVE 02/01/2020 0000   PROTEINUR 100 (A) 02/01/2020 0000   NITRITE POSITIVE (A) 02/01/2020 0000   LEUKOCYTESUR MODERATE (A) 02/01/2020 0000   Sepsis Labs: @LABRCNTIP (procalcitonin:4,lacticidven:4) ) Recent Results (from the past 240 hour(s))  SARS Coronavirus 2 by RT PCR (hospital order, performed in [facility] hospital lab) Nasopharyngeal Nasopharyngeal Swab     Status: Abnormal   Collection Time: 02/16/20 10:41 PM   Specimen: Nasopharyngeal Swab  Result Value Ref Range Status   SARS Coronavirus 2 POSITIVE (A) NEGATIVE Final    Comment: RESULT CALLED TO, READ BACK BY AND VERIFIED WITH: RN I HODGES AT 0225 02/17/20 [place]  (NOTE) SARS-CoV-2 target nucleic acids are DETECTED  SARS-CoV-2 RNA is generally detectable in upper respiratory specimens  during the  acute phase of infection.  Positive results are indicative  of the presence of the identified virus, but do not rule out bacterial infection or co-infection with other pathogens not detected by the test.  Clinical correlation with patient history and  other diagnostic information is necessary to determine patient infection status.  The expected result is negative.  Fact Sheet for Patients:   StrictlyIdeas.no   Fact Sheet for Healthcare Providers:   BankingDealers.co.za    This test is not yet approved or cleared by the [place] FDA and  has been authorized for detection and/or diagnosis of SARS-CoV-2 by FDA under an Emergency Use Authorization (EUA).  This EUA will remain in effect (meaning th is test can be used) for the duration of  the COVID-19 declaration under Section 564(b)(1) of the Act, 21 U.S.C. section 360-bbb-3(b)(1), unless the authorization is terminated or revoked sooner.  Performed at [facility], [place] [address] [place], [place] [address]      Radiological Exams on Admission: DG Chest Port 1 View  Result Date: 02/16/2020 CLINICAL DATA:  Shortness of breath EXAM: PORTABLE CHEST 1 VIEW COMPARISON:  01/31/2020 FINDINGS: Right-greater-than-left bibasilar opacities, unchanged. Rightward mediastinal shift is also unchanged. No pleural effusion or pneumothorax. IMPRESSION: Unchanged right-greater-than-left bibasilar opacities. Electronically Signed   By: Ulyses Jarred M.D.   On: 02/16/2020 23:10    EKG: Independently reviewed.  Sinus tachycardia.  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 Acute respiratory failure with hypoxia secondary to COVID-19 infection for which patient has been started on remdesivir I have started patient on Solu-Medrol.  Inflammatory marker is pending closely monitor respiratory status. 2. History of proximal atrial fibrillation and pulmonary embolism on Xarelto. 3. History of lung cancer being followed by Dr. Julien Nordmann oncologist. 4. History of dementia no acute issues at this time.  Since patient has respiratory failure with Covid infection will need close monitoring for any further worsening in inpatient status.   DVT prophylaxis: Xarelto. Code Status: Full code. Family Communication: Patient's daughter. Disposition Plan: Home. Consults called: None. Admission status: Inpatient.   Rise Patience MD [facility] Pager [phone].  If 7PM-7AM, please contact night-coverage [email or web address] Password [email or web address]  02/17/2020, 5:40 AM

## 2020-02-17 NOTE — ED Notes (Signed)
Date and time results received: 02/17/20 2:25 AM  (use smartphrase ".now" to insert current time)  Test: SARS COVID Critical Value: Positive  Name of Provider Notified:Dr.Molpus  Orders Received? Or Actions Taken?:

## 2020-02-17 NOTE — Progress Notes (Signed)
PROGRESS NOTE  Richard [name] ZOX:[id number] DOB: 08/18/1940   PCP: [name], NP  Patient is from: Home.  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 Julien Nordmann, paroxysmal A. fib and PE on Xarelto, dementia and HTN presenting with acute shortness of breath for 1 day and admitted for COVID-19 infection. Not vaccinated.  Daughter and grand-daughter tested positive as well  In ED, COVID-19 PCR positive.  He had a negative test on 8/18.  HR 129.  RR 25.  Blood pressure was normal.  98% on RA but dropped to 92%.  He was started on 2 L by nasal cannula recovered to upper 90s to 100%.  CXR without acute finding.  CMP and CBC without significant finding.  Troponin negative.  Lactic acid 1.8.  Patient was started on Solu-Medrol progressively and admitted for COVID-19 infection.  Subjective: Seen and examined earlier this morning.  No major events overnight of this morning.  Patient is poor historian.  He repeatedly uses an f**k word to respond to questions but no combative or aggressive.  He responds no to pain, shortness of breath or GI symptoms but only oriented to self. He knows he is in the hospital but doesn't know the name and why.   Objective: Vitals:   02/17/20 0600 02/17/20 0630 02/17/20 0906 02/17/20 1300  BP: 113/68 101/65 109/73 119/65  Pulse: 99 90 (!) 105 85  Resp: 19 (!) 24 13 20   Temp: 98.6 F (37 C)     TempSrc: Oral     SpO2: 99% 97% 99% 100%  Weight:      Height:       No intake or output data in the 24 hours ending 02/17/20 1331 Filed Weights   02/16/20 2137  Weight: 72.6 kg    Examination:  GENERAL: Frail looking elderly male.  Nontoxic. HEENT: MMM.  Vision and hearing grossly intact.  NECK: Supple.  No apparent JVD.  RESP: On 2 L.  No IWOB.  Fair aeration bilaterally. CVS:  RRR. Heart sounds normal.  ABD/GI/GU: BS+. Abd soft, NTND.  MSK/EXT:   Moves extremities. No apparent deformity. No edema.  SKIN: no apparent skin lesion or wound NEURO: Awake, alert and oriented self and partial place.  No apparent focal neuro deficit. PSYCH: Calm.  Poor insight.  Procedures:  None  Microbiology summarized: COVID-19 PCR positive.  Assessment & Plan: Acute respiratory failure with hypoxia due to COVID-19 infection-symptomatic for 1 day.  Desaturated to 92% on RA and started on 2 L.  CXR without acute finding.  Mild elevated inflammatory markers but not sure if these are reliable given history of cancer and PE. Recent Labs    02/17/20 0605  DDIMER 0.97*  CRP 2.5*  -Continue remdesivir and Solu-Medrol-9/3>> -Supportive care with incentive spirometry, inhalers, mucolytic's, antitussive -Continue PPI while on steroids -PT/OT eval  Stage III squamous cell carcinoma of the lung followed by Dr. Julien Nordmann -Outpatient follow-up  Paroxysmal A. fib: Rate controlled without medication. -Continue home Xarelto  History of PE -Continue home Xarelto.  Dementia with behavioral disturbance: Oriented to self and partial place only. Likely baseline per daughter.  Repeatedly says f**k to respond to every question but not combative or aggressive. -Reorientation and delirium precautions.  Debility/physical deconditioning -PT/OT eval.  Body mass index is 21.7 kg/m.       DVT prophylaxis:   rivaroxaban (XARELTO) tablet 20 mg  Code Status: Full code-confirmed with daughter Family Communication: Updated patient's daughter over the phone. Status is: Inpatient  Remains inpatient appropriate because:Unsafe d/c plan, IV treatments appropriate due to intensity of illness or inability to take PO and Inpatient level of care appropriate due to severity of illness   Dispo: The patient is from: Home              Anticipated d/c is to: To be determined              Anticipated d/c date is: 3 days              Patient currently is not medically stable  to d/c.       Consultants:  None   Sch Meds:  Scheduled Meds: . vitamin C  500 mg Oral Daily  . ferrous sulfate  325 mg Oral Q breakfast  . methylPREDNISolone (SOLU-MEDROL) injection  0.5 mg/kg Intravenous Q12H  . pantoprazole  40 mg Oral Daily  . polyethylene glycol  17 g Oral BID  . rivaroxaban  20 mg Oral Q supper  . zinc sulfate  220 mg Oral Daily   Continuous Infusions: . [START ON 02/18/2020] remdesivir 100 mg in NS 100 mL     PRN Meds:.bisacodyl, chlorpheniramine-HYDROcodone, guaiFENesin-dextromethorphan, ondansetron **OR** ondansetron (ZOFRAN) IV  Antimicrobials: Anti-infectives (From admission, onward)   Start     Dose/Rate Route Frequency Ordered Stop   02/18/20 1000  remdesivir 100 mg in sodium chloride 0.9 % 100 mL IVPB       "Followed by" Linked Group Details   100 mg 200 mL/hr over 30 Minutes Intravenous Daily 02/17/20 0328 02/22/20 0959   02/17/20 0400  remdesivir 200 mg in sodium chloride 0.9% 250 mL IVPB       "Followed by" Linked Group Details   200 mg 580 mL/hr over 30 Minutes Intravenous Once 02/17/20 0328 02/17/20 0645       I have personally reviewed the following labs and images: CBC: Recent Labs  Lab 02/16/20 2345 02/17/20 0605  WBC 6.8 5.1  NEUTROABS 2.9 2.2  HGB 13.9 12.7*  HCT 42.0 38.3*  MCV 67.7* 67.7*  PLT 267 237   BMP &GFR Recent Labs  Lab 02/16/20 2345 02/17/20 0605  NA 143 140  K 4.4 4.0  CL 106 109  CO2 27 22  GLUCOSE 116* 110*  BUN 21 18  CREATININE 1.02 0.77  CALCIUM 9.5 8.7*   Estimated Creatinine Clearance: 79.4 mL/min (by C-G formula based on SCr of 0.77 mg/dL). Liver & Pancreas: Recent Labs  Lab 02/17/20 0605  AST 33  ALT 24  ALKPHOS 81  BILITOT 0.3  PROT 6.0*  ALBUMIN 3.2*   No results for input(s): LIPASE, AMYLASE in the last 168 hours. No results for input(s): AMMONIA in the last 168 hours. Diabetic: No results for input(s): HGBA1C in the last 72 hours. Recent Labs  Lab 02/17/20 1222   GLUCAP 167*   Cardiac Enzymes: No results for input(s): CKTOTAL, CKMB, CKMBINDEX, TROPONINI in the last 168 hours. No results for input(s): PROBNP in the last 8760 hours. Coagulation Profile: No results for input(s): INR, PROTIME in the last 168 hours. Thyroid Function Tests: No results for input(s): TSH, T4TOTAL, FREET4, T3FREE, THYROIDAB in the last 72 hours. Lipid Profile: No results for input(s): CHOL, HDL, LDLCALC, TRIG, CHOLHDL, LDLDIRECT in the last 72 hours. Anemia Panel: No results for input(s): VITAMINB12, FOLATE, FERRITIN, TIBC, IRON, RETICCTPCT in the last 72 hours. Urine analysis:  Component Value Date/Time   COLORURINE YELLOW 02/01/2020 0000   APPEARANCEUR CLOUDY (A) 02/01/2020 0000   LABSPEC 1.044 (H) 02/01/2020 0000   PHURINE 9.0 (H) 02/01/2020 0000   GLUCOSEU NEGATIVE 02/01/2020 0000   HGBUR LARGE (A) 02/01/2020 0000   BILIRUBINUR NEGATIVE 02/01/2020 0000   KETONESUR NEGATIVE 02/01/2020 0000   PROTEINUR 100 (A) 02/01/2020 0000   NITRITE POSITIVE (A) 02/01/2020 0000   LEUKOCYTESUR MODERATE (A) 02/01/2020 0000   Sepsis Labs: Invalid input(s): PROCALCITONIN, [place]  Microbiology: Recent Results (from the past 240 hour(s))  SARS Coronavirus 2 by RT PCR (hospital order, performed in [facility] hospital lab) Nasopharyngeal Nasopharyngeal Swab     Status: Abnormal   Collection Time: 02/16/20 10:41 PM   Specimen: Nasopharyngeal Swab  Result Value Ref Range Status   SARS Coronavirus 2 POSITIVE (A) NEGATIVE Final    Comment: RESULT CALLED TO, READ BACK BY AND VERIFIED WITH: RN I HODGES AT 0225 02/17/20 CRUICKSHANK  (NOTE) SARS-CoV-2 target nucleic acids are DETECTED  SARS-CoV-2 RNA is generally detectable in upper respiratory specimens  during the acute phase of infection.  Positive results are indicative  of the presence of the identified virus, but do not rule out bacterial infection or co-infection with other pathogens not detected by the test.   Clinical correlation with patient history and  other diagnostic information is necessary to determine patient infection status.  The expected result is negative.  Fact Sheet for Patients:   StrictlyIdeas.no   Fact Sheet for Healthcare Providers:   BankingDealers.co.za    This test is not yet approved or cleared by the [place] FDA and  has been authorized for detection and/or diagnosis of SARS-CoV-2 by FDA under an Emergency Use Authorization (EUA).  This EUA will remain in effect (meaning th is test can be used) for the duration of  the COVID-19 declaration under Section 564(b)(1) of the Act, 21 U.S.C. section 360-bbb-3(b)(1), unless the authorization is terminated or revoked sooner.  Performed at [facility], [place] [address] [place], [place] [address]     Radiology Studies: [facility] Chest Port 1 View  Result Date: 02/16/2020 CLINICAL DATA:  Shortness of breath EXAM: PORTABLE CHEST 1 VIEW COMPARISON:  01/31/2020 FINDINGS: Right-greater-than-left bibasilar opacities, unchanged. Rightward mediastinal shift is also unchanged. No pleural effusion or pneumothorax. IMPRESSION: Unchanged right-greater-than-left bibasilar opacities. Electronically Signed   By: Ulyses Jarred M.D.   On: 02/16/2020 23:10   30 minutes with more than 50% spent in reviewing records, getting collateral information from family, counseling patient/family and coordinating care.   Madisin Hasan T. [place]  If 7PM-7AM, please contact night-coverage [email or web address] 02/17/2020, 1:31 PM

## 2020-02-17 NOTE — ED Notes (Signed)
Assumed care of patient at this time, nad noted, sr up x2, bed locked and low, call bell w/I reach.  Will continue to monitor.  Condom cath in place and draining yellow urine.

## 2020-02-17 NOTE — ED Notes (Signed)
Patient stating "I want to get the f*ck out of here. I'm tired of being here." Patient utilizing many curse words when speaking to RN but not aggressive or combative. Patient took his medication without incident. Patient assisted into comfortable position and was finishing his breakfast. RN assisted patient to turn the TV to cartoons. Patient denies other needs at this time.

## 2020-02-18 DIAGNOSIS — R8271 Bacteriuria: Secondary | ICD-10-CM

## 2020-02-18 DIAGNOSIS — R8281 Pyuria: Secondary | ICD-10-CM

## 2020-02-18 LAB — CBC WITH DIFFERENTIAL/PLATELET
Abs Immature Granulocytes: 0.01 10*3/uL (ref 0.00–0.07)
Basophils Absolute: 0 10*3/uL (ref 0.0–0.1)
Basophils Relative: 0 %
Eosinophils Absolute: 0 10*3/uL (ref 0.0–0.5)
Eosinophils Relative: 0 %
HCT: 36.5 % — ABNORMAL LOW (ref 39.0–52.0)
Hemoglobin: 12 g/dL — ABNORMAL LOW (ref 13.0–17.0)
Immature Granulocytes: 0 %
Lymphocytes Relative: 28 %
Lymphs Abs: 1.4 10*3/uL (ref 0.7–4.0)
MCH: 22.2 pg — ABNORMAL LOW (ref 26.0–34.0)
MCHC: 32.9 g/dL (ref 30.0–36.0)
MCV: 67.6 fL — ABNORMAL LOW (ref 80.0–100.0)
Monocytes Absolute: 0.3 10*3/uL (ref 0.1–1.0)
Monocytes Relative: 5 %
Neutro Abs: 3.4 10*3/uL (ref 1.7–7.7)
Neutrophils Relative %: 67 %
Platelets: 228 10*3/uL (ref 150–400)
RBC: 5.4 MIL/uL (ref 4.22–5.81)
RDW: 16.3 % — ABNORMAL HIGH (ref 11.5–15.5)
WBC: 5.1 10*3/uL (ref 4.0–10.5)
nRBC: 0 % (ref 0.0–0.2)

## 2020-02-18 LAB — COMPREHENSIVE METABOLIC PANEL
ALT: 21 U/L (ref 0–44)
AST: 33 U/L (ref 15–41)
Albumin: 3.3 g/dL — ABNORMAL LOW (ref 3.5–5.0)
Alkaline Phosphatase: 77 U/L (ref 38–126)
Anion gap: 11 (ref 5–15)
BUN: 22 mg/dL (ref 8–23)
CO2: 21 mmol/L — ABNORMAL LOW (ref 22–32)
Calcium: 8.6 mg/dL — ABNORMAL LOW (ref 8.9–10.3)
Chloride: 105 mmol/L (ref 98–111)
Creatinine, Ser: 0.8 mg/dL (ref 0.61–1.24)
GFR calc Af Amer: >60 mL/min (ref >60)
GFR calc non Af Amer: >60 mL/min (ref >60)
Glucose, Bld: 110 mg/dL — ABNORMAL HIGH (ref 70–99)
Potassium: 4.6 mmol/L (ref 3.5–5.1)
Sodium: 137 mmol/L (ref 135–145)
Total Bilirubin: 0.4 mg/dL (ref 0.3–1.2)
Total Protein: 6.4 g/dL — ABNORMAL LOW (ref 6.5–8.1)

## 2020-02-18 LAB — D-DIMER, QUANTITATIVE: D-Dimer, Quant: 1.76 ug/mL-FEU — ABNORMAL HIGH (ref 0.00–0.50)

## 2020-02-18 LAB — C-REACTIVE PROTEIN: CRP: 1.6 mg/dL — ABNORMAL HIGH (ref <1.0)

## 2020-02-18 MED ORDER — ADULT MULTIVITAMIN W/MINERALS CH
1.0000 | ORAL_TABLET | Freq: Every day | ORAL | Status: DC
  Administered 2020-02-19 – 2020-02-21 (×3): 1 via ORAL
  Filled 2020-02-18 (×3): qty 1

## 2020-02-18 MED ORDER — ACETAMINOPHEN 325 MG PO TABS
650.0000 mg | ORAL_TABLET | Freq: Four times a day (QID) | ORAL | Status: DC | PRN
  Administered 2020-02-18: 650 mg via ORAL
  Filled 2020-02-18: qty 2

## 2020-02-18 MED ORDER — ENSURE ENLIVE PO LIQD
237.0000 mL | Freq: Three times a day (TID) | ORAL | Status: DC
  Administered 2020-02-18 – 2020-02-21 (×5): 237 mL via ORAL
  Filled 2020-02-18 (×7): qty 237

## 2020-02-18 NOTE — Progress Notes (Signed)
Initial Nutrition Assessment  DOCUMENTATION CODES:   Not applicable  INTERVENTION:   Ensure Enlive po TID, each supplement provides 350 kcal and 20 grams of protein  MVI daily  Liberalize diet   Pt at high refeed risk; recommend monitor K, Mg and P labs daily until stable  NUTRITION DIAGNOSIS:   Increased nutrient needs related to catabolic illness (COVID 19, lung cancer) as evidenced by increased estimated needs.  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 Julien Nordmann, PEG tube from 05/25/19-09/08/19, paroxysmal A. fib and PE on Xarelto, dementia and HTN who is admitted for COVID-19 infection.   Unable to speak with patient as pt with dementia and remains in the ED. Pt is well known to nutrition department from multiple previous admits. Suspect pt with poor appetite and oral intake pta r/t COVID 19. RD will add supplements and MVI to help pt meet his estimated needs. RD will also liberalize pt's diet as a heart healthy diet is restrictive of protein. Pt is likely at refeed risk. Per chart, pt with weight gain pta. RD will obtain nutrition related exam and history at follow-up. Pt is a high risk for malnutrition.   Medications reviewed and include: vitamin C, colace, solu-medrol, protonix, zinc  Labs reviewed:   NUTRITION - FOCUSED PHYSICAL EXAM: Unable to perform at this time   Diet Order:   Diet Order            Diet Heart Room service appropriate? Yes; Fluid consistency: Thin  Diet effective now                EDUCATION NEEDS:   Not appropriate for education at this time  Skin:  Skin Assessment: Reviewed RN Assessment  Last BM:  9/3- type 2  Height:   Ht Readings  from Last 1 Encounters:  02/16/20 6' (1.829 m)    Weight:   Wt Readings from Last 1 Encounters:  02/16/20 72.6 kg    Ideal Body Weight:  80.9 kg  BMI:  Body mass index is 21.7 kg/m.  Estimated Nutritional Needs:   Kcal:  2100-2400kcal/day  Protein:  110-125g/day  Fluid:  >1.8L/day  Koleen Distance MS, RD, LDN Please refer to [facility] for RD and/or RD on-call/weekend/after hours pager

## 2020-02-18 NOTE — Progress Notes (Signed)
PROGRESS NOTE  Richard [name] MAU:[id number] DOB: 1940-08-23   PCP: [name], NP  Patient is from: Home.  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 Julien Nordmann, paroxysmal A. fib and PE on Xarelto, dementia and HTN presenting with acute shortness of breath for 1 day and admitted for COVID-19 infection. Not vaccinated.  Daughter and grand-daughter tested positive as well  In ED, COVID-19 PCR positive.  He had a negative test on 8/18.  HR 129.  RR 25.  Blood pressure was normal.  98% on RA but dropped to 92%.  He was started on 2 L by nasal cannula recovered to upper 90s to 100%.  CXR without acute finding.  CMP and CBC without significant finding.  Troponin negative.  Lactic acid 1.8.  Patient was started on Solu-Medrol progressively and admitted for COVID-19 infection.  Subjective: Seen and examined earlier this morning.  No major events overnight of this morning.  No complaints but not a reliable historian.  He says "I sleep in on Sundays".  Responds no to pain, shortness of breath, nausea, vomiting, abdominal pain, dysuria or other UTI symptoms.  Objective: Vitals:   02/18/20 0600 02/18/20 0615 02/18/20 0813 02/18/20 0952  BP: (!) 112/96  (!) 97/56 (!) 101/56  Pulse: 86 79 84 75  Resp: 18 (!) 27 14 20   Temp:      TempSrc:      SpO2: 97% 94% 97% 94%  Weight:      Height:       No intake or output data in the 24 hours ending 02/18/20 1132 Filed Weights   02/16/20 2137  Weight: 72.6 kg    Examination:  GENERAL: Frail looking elderly male.  Nontoxic. HEENT: MMM.  Vision and hearing grossly intact.  NECK: Supple.  No apparent JVD.  RESP: On room air.  No IWOB.  Fair aeration bilaterally. CVS:  RRR. Heart sounds normal.  ABD/GI/GU: BS+. Abd soft, NTND.  MSK/EXT:  Moves extremities.  Some degree of muscle mass and subcu fat loss. SKIN: no apparent skin lesion or  wound NEURO: Awake and alert.  Only oriented to self.  No apparent focal neuro deficit. PSYCH: Calm. Normal affect.   Procedures:  None  Microbiology summarized: COVID-19 PCR positive.  Assessment & Plan: Acute respiratory failure with hypoxia due to COVID-19 infection-symptomatic for 1 day.  Desaturated to 92% on RA and started on 2 L.  CXR without acute finding.  Mild elevated inflammatory markers but not sure if these are reliable given history of cancer and PE.  Inflammatory markers are downtrending. Recent Labs    02/17/20 0605 02/18/20 0454 02/18/20 0458  DDIMER 0.97* 1.76*  --   CRP 2.5*  --  1.6*  -Continue remdesivir and Solu-Medrol-9/3>> -Supportive care with incentive spirometry, inhalers, mucolytic's, antitussive -Unreliable support system and transportation for outpatient remdesivir infusion. -Continue PPI while on steroids -PT/OT eval pending.  Stage III squamous cell carcinoma of the lung followed by Dr. Julien Nordmann -Outpatient follow-up  Paroxysmal A. fib: Rate controlled without medication. -Continue home Xarelto  History of PE -Continue home Xarelto.  Dementia with behavioral disturbance: Oriented to self and partial place only. Likely baseline per daughter.  Repeatedly says f**k to respond to every question but not combative or aggressive. -Reorientation and delirium precautions.  Pyuria/bacteriuria-recently treated for UTI.  UA with pyuria and bacteriuria.  Patient denies UTI symptoms but not a  reliable historian. -Check urine culture  Debility/physical deconditioning -PT/OT eval-pending.  Body mass index is 21.7 kg/m.       DVT prophylaxis:   rivaroxaban (XARELTO) tablet 20 mg    Code Status: Full code-confirmed with daughter Family Communication: Updated patient's daughter over the phone on 8/3. Status is: Inpatient  Remains inpatient appropriate because:Unsafe d/c plan, IV treatments appropriate due to intensity of illness or inability to  take PO and Inpatient level of care appropriate due to severity of illness   Dispo: The patient is from: Home              Anticipated d/c is to: To be determined              Anticipated d/c date is: 3 days              Patient currently is not medically stable to d/c.       Consultants:  None   Sch Meds:  Scheduled Meds: . vitamin C  500 mg Oral Daily  . docusate sodium  100 mg Oral Daily  . ferrous sulfate  325 mg Oral Q breakfast  . methylPREDNISolone (SOLU-MEDROL) injection  0.5 mg/kg Intravenous Q12H  . pantoprazole  40 mg Oral Daily  . rivaroxaban  20 mg Oral Q supper  . zinc sulfate  220 mg Oral Daily   Continuous Infusions: . remdesivir 100 mg in NS 100 mL     PRN Meds:.chlorpheniramine-HYDROcodone, guaiFENesin-dextromethorphan, ondansetron **OR** ondansetron (ZOFRAN) IV, polyethylene glycol, senna-docusate  Antimicrobials: Anti-infectives (From admission, onward)   Start     Dose/Rate Route Frequency Ordered Stop   02/18/20 1000  remdesivir 100 mg in sodium chloride 0.9 % 100 mL IVPB       "Followed by" Linked Group Details   100 mg 200 mL/hr over 30 Minutes Intravenous Daily 02/17/20 0328 02/22/20 0959   02/17/20 0400  remdesivir 200 mg in sodium chloride 0.9% 250 mL IVPB       "Followed by" Linked Group Details   200 mg 580 mL/hr over 30 Minutes Intravenous Once 02/17/20 0328 02/17/20 0645       I have personally reviewed the following labs and images: CBC: Recent Labs  Lab 02/16/20 2345 02/17/20 0605 02/18/20 0454  WBC 6.8 5.1 5.1  NEUTROABS 2.9 2.2 3.4  HGB 13.9 12.7* 12.0*  HCT 42.0 38.3* 36.5*  MCV 67.7* 67.7* 67.6*  PLT 267 237 228   BMP &GFR Recent Labs  Lab 02/16/20 2345 02/17/20 0605 02/18/20 0454  NA 143 140 137  K 4.4 4.0 4.6  CL 106 109 105  CO2 27 22 21*  GLUCOSE 116* 110* 110*  BUN 21 18 22   CREATININE 1.02 0.77 0.80  CALCIUM 9.5 8.7* 8.6*   Estimated Creatinine Clearance: 79.4 mL/min (by C-G formula based on SCr of  0.8 mg/dL). Liver & Pancreas: Recent Labs  Lab 02/17/20 0605 02/18/20 0454  AST 33 33  ALT 24 21  ALKPHOS 81 77  BILITOT 0.3 0.4  PROT 6.0* 6.4*  ALBUMIN 3.2* 3.3*   No results for input(s): LIPASE, AMYLASE in the last 168 hours. No results for input(s): AMMONIA in the last 168 hours. Diabetic: No results for input(s): HGBA1C in the last 72 hours. Recent Labs  Lab 02/17/20 1222  GLUCAP 167*   Cardiac Enzymes: No results for input(s): CKTOTAL, CKMB, CKMBINDEX, TROPONINI in the last 168 hours. No results for input(s): PROBNP in the last 8760 hours. Coagulation Profile: No results for input(s): INR,  PROTIME in the last 168 hours. Thyroid Function Tests: No results for input(s): TSH, T4TOTAL, FREET4, T3FREE, THYROIDAB in the last 72 hours. Lipid Profile: No results for input(s): CHOL, HDL, LDLCALC, TRIG, CHOLHDL, LDLDIRECT in the last 72 hours. Anemia Panel: No results for input(s): VITAMINB12, FOLATE, FERRITIN, TIBC, IRON, RETICCTPCT in the last 72 hours. Urine analysis:    Component Value Date/Time   COLORURINE YELLOW 02/17/2020 1640   APPEARANCEUR CLOUDY (A) 02/17/2020 1640   LABSPEC 1.023 02/17/2020 1640   PHURINE 8.0 02/17/2020 1640   GLUCOSEU NEGATIVE 02/17/2020 1640   HGBUR NEGATIVE 02/17/2020 1640   BILIRUBINUR NEGATIVE 02/17/2020 1640   KETONESUR NEGATIVE 02/17/2020 1640   PROTEINUR 100 (A) 02/17/2020 1640   NITRITE POSITIVE (A) 02/17/2020 1640   LEUKOCYTESUR LARGE (A) 02/17/2020 1640   Sepsis Labs: Invalid input(s): PROCALCITONIN, [place]  Microbiology: Recent Results (from the past 240 hour(s))  SARS Coronavirus 2 by RT PCR (hospital order, performed in [facility] hospital lab) Nasopharyngeal Nasopharyngeal Swab     Status: Abnormal   Collection Time: 02/16/20 10:41 PM   Specimen: Nasopharyngeal Swab  Result Value Ref Range Status   SARS Coronavirus 2 POSITIVE (A) NEGATIVE Final    Comment: RESULT CALLED TO, READ BACK BY AND VERIFIED WITH: RN I  HODGES AT 0225 02/17/20 CRUICKSHANK  (NOTE) SARS-CoV-2 target nucleic acids are DETECTED  SARS-CoV-2 RNA is generally detectable in upper respiratory specimens  during the acute phase of infection.  Positive results are indicative  of the presence of the identified virus, but do not rule out bacterial infection or co-infection with other pathogens not detected by the test.  Clinical correlation with patient history and  other diagnostic information is necessary to determine patient infection status.  The expected result is negative.  Fact Sheet for Patients:   StrictlyIdeas.no   Fact Sheet for Healthcare Providers:   BankingDealers.co.za    This test is not yet approved or cleared by the [place] FDA and  has been authorized for detection and/or diagnosis of SARS-CoV-2 by FDA under an Emergency Use Authorization (EUA).  This EUA will remain in effect (meaning th is test can be used) for the duration of  the COVID-19 declaration under Section 564(b)(1) of the Act, 21 U.S.C. section 360-bbb-3(b)(1), unless the authorization is terminated or revoked sooner.  Performed at [facility], [place] [address], [place], [place] [address]     Radiology Studies: No results found.    Sherri Levenhagen T. [place]  If 7PM-7AM, please contact night-coverage [email or web address] 02/18/2020, 11:31 AM

## 2020-02-19 LAB — CBC WITH DIFFERENTIAL/PLATELET
Abs Immature Granulocytes: 0.02 10*3/uL (ref 0.00–0.07)
Basophils Absolute: 0 10*3/uL (ref 0.0–0.1)
Basophils Relative: 0 %
Eosinophils Absolute: 0 10*3/uL (ref 0.0–0.5)
Eosinophils Relative: 0 %
HCT: 36.5 % — ABNORMAL LOW (ref 39.0–52.0)
Hemoglobin: 12.3 g/dL — ABNORMAL LOW (ref 13.0–17.0)
Immature Granulocytes: 0 %
Lymphocytes Relative: 14 %
Lymphs Abs: 1 10*3/uL (ref 0.7–4.0)
MCH: 22.6 pg — ABNORMAL LOW (ref 26.0–34.0)
MCHC: 33.7 g/dL (ref 30.0–36.0)
MCV: 67 fL — ABNORMAL LOW (ref 80.0–100.0)
Monocytes Absolute: 0.2 10*3/uL (ref 0.1–1.0)
Monocytes Relative: 3 %
Neutro Abs: 6.1 10*3/uL (ref 1.7–7.7)
Neutrophils Relative %: 83 %
Platelets: 252 10*3/uL (ref 150–400)
RBC: 5.45 MIL/uL (ref 4.22–5.81)
RDW: 16 % — ABNORMAL HIGH (ref 11.5–15.5)
WBC: 7.3 10*3/uL (ref 4.0–10.5)
nRBC: 0 % (ref 0.0–0.2)

## 2020-02-19 LAB — COMPREHENSIVE METABOLIC PANEL
ALT: 29 U/L (ref 0–44)
AST: 57 U/L — ABNORMAL HIGH (ref 15–41)
Albumin: 3.5 g/dL (ref 3.5–5.0)
Alkaline Phosphatase: 73 U/L (ref 38–126)
Anion gap: 8 (ref 5–15)
BUN: 31 mg/dL — ABNORMAL HIGH (ref 8–23)
CO2: 24 mmol/L (ref 22–32)
Calcium: 8.8 mg/dL — ABNORMAL LOW (ref 8.9–10.3)
Chloride: 105 mmol/L (ref 98–111)
Creatinine, Ser: 0.88 mg/dL (ref 0.61–1.24)
GFR calc Af Amer: >60 mL/min (ref >60)
GFR calc non Af Amer: >60 mL/min (ref >60)
Glucose, Bld: 147 mg/dL — ABNORMAL HIGH (ref 70–99)
Potassium: 4.6 mmol/L (ref 3.5–5.1)
Sodium: 137 mmol/L (ref 135–145)
Total Bilirubin: 0.4 mg/dL (ref 0.3–1.2)
Total Protein: 6.6 g/dL (ref 6.5–8.1)

## 2020-02-19 LAB — PHOSPHORUS: Phosphorus: 2.4 mg/dL — ABNORMAL LOW (ref 2.5–4.6)

## 2020-02-19 LAB — MAGNESIUM: Magnesium: 2.1 mg/dL (ref 1.7–2.4)

## 2020-02-19 LAB — C-REACTIVE PROTEIN: CRP: 0.9 mg/dL (ref <1.0)

## 2020-02-19 LAB — D-DIMER, QUANTITATIVE: D-Dimer, Quant: 1.08 ug/mL-FEU — ABNORMAL HIGH (ref 0.00–0.50)

## 2020-02-19 NOTE — Evaluation (Signed)
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 fib and PE, dementia and HTN presenting with acute shortness of breath for 1 day and admitted for COVID-19 infection    PT Comments    Pt admitted with above diagnosis.  Pt agreeable with coaxing and encouragement. Able to amb with min assist, fatigued although VS stable. Will continue to follow in acute setting. No follow up recommended at this time as pt is typically not very agreeable to PT.     Pt currently with functional limitations due to the deficits listed below (see PT Problem List). Pt will benefit from skilled PT to increase their independence and safety with mobility to allow discharge to the venue listed below.     Follow Up Recommendations  No PT follow up     Equipment Recommendations  None recommended by PT    Recommendations for Other Services       Precautions / Restrictions Precautions Precautions: Fall Restrictions Weight Bearing Restrictions: No    Mobility  Bed Mobility Overal bed mobility: Needs Assistance Bed Mobility: Supine to Sit;Sit to Supine     Supine to sit: Supervision;Min guard;HOB elevated Sit to supine: Supervision   General bed mobility comments: for safety an hand over hand to coax to EOB  Transfers Overall transfer level: Needs assistance Equipment used: None Transfers: Sit to/from Stand Sit to Stand: Min assist         General transfer comment: pt declined using RW at this time  Ambulation/Gait Ambulation/Gait assistance: Min assist Gait Distance (Feet): 20 Feet Assistive device: Rolling walker (2 wheeled);1 person hand held assist;IV Pole Gait Pattern/deviations: Step-through pattern;Decreased stride length;Drifts right/left     General Gait Details: min assist for  balance and safety, IV pole and intermittent HHA, unsteady however no overt LOB, fatigued after 20'. SpO2=90s on RA, most recent BP reading   91/51, 174/77 when checked prior to OOB by PT   Stairs             Wheelchair Mobility    Modified Rankin (Stroke Patients Only)       Balance Overall balance assessment: Needs assistance Sitting-balance support: Feet supported;No upper extremity supported Sitting balance-Leahy Scale: Fair     Standing balance support: Single extremity supported;Bilateral upper extremity supported;During functional activity Standing balance-Leahy Scale: Poor Standing balance comment: reliant on B UE                            Cognition Arousal/Alertness: Awake/alert Behavior During Therapy: WFL for tasks assessed/performed Overall Cognitive Status: History of cognitive impairments - at baseline                                 General Comments: patient perseverating on going home, that he has a twin Denyse Amass that is also here, and that his baby (DTR) is going to come get him and his Mom is going to cook for him at home      Exercises      General Comments General comments (skin integrity, edema, etc.): patient reports mild shortness of breath when getting back into bed, patient on room air with saturations in mid 90s      Pertinent Vitals/Pain  Pain Assessment: Faces Faces Pain Scale: Hurts little more Pain Location: back and LEs Pain Descriptors / Indicators: Grimacing Pain Intervention(s): Monitored during session    Home Living Family/patient expects to be discharged to:: Private residence Living Arrangements: Children (dtr and grand-dtr) Available Help at Discharge: Family Type of Home: House Home Access: Stairs to enter   Home Layout: One level Home Equipment: Environmental consultant - 2 wheels Additional Comments: Information gained from previous admission.    Prior Function Level of Independence: Needs assistance  Gait /  Transfers Assistance Needed: Daughter assists him as needed. ADL's / Homemaking Assistance Needed: no family present to provide PLOF data     PT Goals (current goals can now be found in the care plan section) Acute Rehab PT Goals Patient Stated Goal: "I want to go home" PT Goal Formulation: With patient Time For Goal Achievement: 03/04/20 Potential to Achieve Goals: Good    Frequency           PT Plan      Co-evaluation              AM-PAC PT "6 Clicks" Mobility   Outcome Measure  Help needed turning from your back to your side while in a flat bed without using bedrails?: A Little Help needed moving from lying on your back to sitting on the side of a flat bed without using bedrails?: A Little Help needed moving to and from a bed to a chair (including a wheelchair)?: A Little Help needed standing up from a chair using your arms (e.g., wheelchair or bedside chair)?: A Little Help needed to walk in hospital room?: A Little Help needed climbing 3-5 steps with a railing? : A Little 6 Click Score: 18    End of Session   Activity Tolerance: Patient tolerated treatment well Patient left: in bed;with call bell/phone within reach Nurse Communication: Mobility status PT Visit Diagnosis: Difficulty in walking, not elsewhere classified (R26.2)     Time: 1572-6203 PT Time Calculation (min) (ACUTE ONLY): 23 min  Charges:  $Gait Training: 8-22 mins                     Baxter Flattery, PT  Acute Rehab Dept ([place]) [phone] Pager [phone]  02/19/2020    [facility] 02/19/2020, 12:44 PM

## 2020-02-19 NOTE — Progress Notes (Addendum)
TOC CM ordered 3n1 Bedside Commode for delivery to pt's room. Contacted [employer] rep, Jodell Cipro. Spoke to dtr and pt has 3n1 bedside commode at home.    Why, [place] ED TOC CM [phone]

## 2020-02-19 NOTE — Progress Notes (Signed)
Occupational Therapy Evaluation  Patient with functional deficits listed below impacting safety and independence with self care. Patient supervision for bed mobility, min A with rolling walker for sit to stand with initial posterior lean against bed with min A to correct. Patient able to maintain static standing for approx 3 mins while bed linens changed, total A to doff brief and perform peri care. Patient able to side step to [facility] with min A and walker, reports some shortness of breath once seated with O2 reading mid 90s on room air. Anticipate no further OT needs at discharge as long as patient's family able to provide 24/7 supervision and assist as needed. Will continue to follow acutely to maximize patient safety, balance, activity tolerance in order to reduce caregiver burden.    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 fib and PE, dementia and HTN presenting with acute shortness of breath for 1 day and admitted for COVID-19 infection  Precautions  Precautions Fall  Restrictions  Weight Bearing Restrictions No  Home Living  Family/patient expects to be discharged to: Private residence  Living Arrangements Children (DTR and grandDTR)  Available Help at Discharge Family  Type of [place] to enter  Entrance Stairs-Number of Steps 2-3  [place] One level  Bathroom Shower/Tub Walk-in Patent examiner - 2 wheels  Additional Comments Information gained from previous admission.  Prior Function  Level of Independence Needs assistance  Gait / Transfers Assistance Needed Daughter assists him as needed.  ADL's / Homemaking Assistance Needed no family present to provide PLOF data  Communication  Communication No difficulties  Pain Assessment  Pain  Assessment Faces  Faces Pain Scale 4  Pain Location back   Pain Descriptors / Indicators Grimacing  Pain Intervention(s) Monitored during session  Cognition  Arousal/Alertness Awake/alert  Behavior During Therapy WFL for tasks assessed/performed  Overall Cognitive Status History of cognitive impairments - at baseline  General Comments patient perseverating on going home, that he has a twin Denyse Amass that is also here, and that his baby (DTR) is going to come get him   Upper Extremity Assessment  Upper Extremity Assessment Generalized weakness  Lower Extremity Assessment  Lower Extremity Assessment Defer to PT evaluation  ADL  Overall ADL's  Needs assistance/impaired  Eating/Feeding Set up;Bed level  Grooming Set up;Sitting  Upper Body Bathing Supervision/ safety;Sitting  Lower Body Bathing Moderate assistance;Sitting/lateral leans;Sit to/from stand  Upper Body Dressing  Set up;Sitting  Lower Body Dressing Moderate assistance;Sitting/lateral leans;Sit to/from Teacher, English as a foreign language Details (indicate cue type and reason) patient side step to [facility], min A for unsteadiness  Toileting- Clothing Manipulation and Hygiene Total assistance  Toileting - Clothing Manipulation Details (indicate cue type and reason) patient soiled upon arrival, total A in standing to doff soiled brief and perform peri care  Functional mobility during ADLs Minimal assistance;Rolling walker;Cueing for safety  General ADL Comments unsure of assistance patient requires at baseline however anticipate patient is close to his baseline, patient reports spending a lot of time in bed at home  Bed Mobility  Overal bed mobility Needs Assistance  Bed Mobility Supine to Sit;Sit to Supine  Supine to sit Supervision;HOB elevated  Sit to supine Supervision  General bed mobility  comments for safety  Transfers  Overall transfer level Needs assistance  Equipment used Rolling walker  (2 wheeled)  Transfers Sit to/from Stand  Sit to Stand Min assist  General transfer comment initial posterior loss of balance against bed side, min A to steady and cue for side stepping to head of bed   Balance  Overall balance assessment Needs assistance  Sitting-balance support Feet supported  Sitting balance-Leahy Scale Fair  Standing balance support Bilateral upper extremity supported  Standing balance-Leahy Scale Poor  Standing balance comment reliant on B UE  General Comments  General comments (skin integrity, edema, etc.) patient reports mild shortness of breath when getting back into bed, patient on room air with saturations in mid 90s  OT - End of Session  Equipment Utilized During Treatment Rolling walker  Activity Tolerance Patient tolerated treatment well  Patient left in bed;with call bell/phone within reach;with bed alarm set  Nurse Communication Mobility status;Other (comment) (condom cath)  OT Assessment  OT Recommendation/Assessment Patient needs continued OT Services  OT Visit Diagnosis Unsteadiness on feet (R26.81);Other abnormalities of gait and mobility (R26.89);Muscle weakness (generalized) (M62.81)  OT Problem List Decreased strength;Decreased activity tolerance;Impaired balance (sitting and/or standing);Decreased safety awareness  OT Plan  OT Frequency (ACUTE ONLY) Min 2X/week  OT Treatment/Interventions (ACUTE ONLY) Self-care/ADL training;Therapeutic exercise;Therapeutic activities;Patient/family education;Balance training  AM-PAC OT "6 Clicks" Daily Activity Outcome Measure (Version 2)  Help from another person eating meals? 3  Help from another person taking care of personal grooming? 3  Help from another person toileting, which includes using toliet, bedpan, or urinal? 2  Help from another person bathing (including washing, rinsing, drying)? 2  Help from another person to put on and taking off regular upper body clothing? 3  Help from another person to put  on and taking off regular lower body clothing? 2  6 Click Score 15  OT Recommendation  Follow Up Recommendations Supervision/Assistance - 24 hour  OT Equipment 3 in 1 bedside commode;Other (comment) (if family does not have already)  Individuals Consulted  Consulted and Agree with Results and Recommendations Patient  Acute Rehab OT Goals  Patient Stated Goal "I want to go home"  OT Goal Formulation With patient  Time For Goal Achievement 03/04/20  Potential to Achieve Goals Good  OT Time Calculation  OT Start Time (ACUTE ONLY) 0840  OT Stop Time (ACUTE ONLY) 0905  OT Time Calculation (min) 25 min  OT General Charges  $OT Visit 1 Visit  OT Evaluation  $OT Eval Low Complexity 1 Low  OT Treatments  $Self Care/Home Management  8-22 mins  Written Expression  Dominant Hand  (did not specify)   Delbert Phenix OT OT pager: [phone]

## 2020-02-19 NOTE — Progress Notes (Signed)
PROGRESS NOTE  Richard [name] HYQ:[id number] DOB: 1940-07-07   PCP: [name], NP  Patient is from: Home.  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 Julien Nordmann, paroxysmal A. fib and PE on Xarelto, dementia and HTN presenting with acute shortness of breath for 1 day and admitted for COVID-19 infection. Not vaccinated.  Daughter and grand-daughter tested positive as well  In ED, COVID-19 PCR positive.  He had a negative test on 8/18.  HR 129.  RR 25.  Blood pressure was normal.  98% on RA but dropped to 92%.  He was started on 2 L by nasal cannula recovered to upper 90s to 100%.  CXR without acute finding.  CMP and CBC without significant finding.  Troponin negative.  Lactic acid 1.8.  Patient was started on Solu-Medrol progressively and admitted for COVID-19 infection.  Subjective: Seen and examined earlier this morning.  No major events overnight of this morning.  No complaints but not a reliable historian.  He responds "shit no" to pain, shortness of breath, nausea, vomiting or abdominal pain.  Sitting in bed eating breakfast.  Was saturating at 100% on room air.  Desaturated to 87% when he got up for change but recovered to 98% sitting.   Object seen and examinedive: Vitals:   02/19/20 0943 02/19/20 1229 02/19/20 1245 02/19/20 1300  BP: 115/66 118/70 (!) 107/57 (!) 108/56  Pulse: 84 90 95   Resp:  16    Temp:  (!) 97.2 F (36.2 C)    TempSrc:  Oral    SpO2: 98% (!) 87% (!) 89%   Weight:      Height:        Intake/Output Summary (Last 24 hours) at 02/19/2020 1602 Last data filed at 02/18/2020 2150 Gross per 24 hour  Intake 103.7 ml  Output --  Net 103.7 ml   Filed Weights   02/16/20 2137  Weight: 72.6 kg    Examination:  GENERAL: Frail looking elderly male..  Nontoxic. HEENT: MMM.  Vision and hearing grossly intact.  NECK: Supple.  No apparent JVD.  RESP:  98% on RA.  No IWOB.  Fair aeration bilaterally. CVS:  RRR. Heart sounds normal.  ABD/GI/GU: BS+. Abd soft, NTND.  MSK/EXT:  Moves extremities. No apparent deformity. No edema.  SKIN: no apparent skin lesion or wound NEURO:  Awake and alert. Oriented to self. No apparent focal neuro deficit. PSYCH: Calm. Normal affect.  Procedures:  None  Microbiology summarized: COVID-19 PCR positive.  Assessment & Plan: Acute respiratory failure with hypoxia due to COVID-19 infection-symptomatic for 1 day.  Desaturated to 87% when he stood up for gown change.  CXR without acute finding.  Mild elevated inflammatory markers but not sure if these are reliable given history of cancer and PE.  Inflammatory markers are downtrending. Recent Labs    02/17/20 0605 02/18/20 0454 02/18/20 0458 02/19/20 0419  DDIMER 0.97* 1.76*  --  1.08*  CRP 2.5*  --  1.6* 0.9  -Continue remdesivir and Solu-Medrol-9/3>> -Supportive care with incentive spirometry, inhalers, mucolytic's, antitussive -Unreliable support system and transportation for outpatient remdesivir infusion. -Continue PPI while on steroids -PT/OT eval  Stage III squamous cell carcinoma of the lung followed by Dr. Julien Nordmann -Outpatient follow-up  Paroxysmal A. fib: Rate controlled without medication. -Continue home Xarelto  History of PE -Continue home Xarelto.  Dementia with behavioral disturbance: Oriented to self and partial  place only. Likely baseline per daughter.  -Reorientation and delirium precautions.  Pyuria/bacteriuria-recently treated for UTI.  UA with pyuria and bacteriuria.  Patient denies UTI symptoms but not a reliable historian. -Follow urine culture  Debility/physical deconditioning -PT/OT recommended bedside commode.  Body mass index is 21.7 kg/m. Nutrition Problem: Increased nutrient needs Etiology: catabolic illness (COVID 19, lung cancer) Signs/Symptoms: estimated needs   DVT prophylaxis:   rivaroxaban (XARELTO)  tablet 20 mg    Code Status: Full code-confirmed with daughter Family Communication: Updated patient's daughter over the phone. She reports difficulty reaching him or his RN over the phone.  Status is: Inpatient  Remains inpatient appropriate because:Unsafe d/c plan, IV treatments appropriate due to intensity of illness or inability to take PO and Inpatient level of care appropriate due to severity of illness.  Remains inpatient to complete remdesivir infusion due to lack of reliable transportation.    Dispo: The patient is from: Home              Anticipated d/c is to: Home              Anticipated d/c date is: 2 days              Patient currently is not medically stable to d/c.       Consultants:  None   Sch Meds:  Scheduled Meds: . vitamin C  500 mg Oral Daily  . docusate sodium  100 mg Oral Daily  . feeding supplement (ENSURE ENLIVE)  237 mL Oral TID BM  . ferrous sulfate  325 mg Oral Q breakfast  . methylPREDNISolone (SOLU-MEDROL) injection  0.5 mg/kg Intravenous Q12H  . multivitamin with minerals  1 tablet Oral Daily  . pantoprazole  40 mg Oral Daily  . rivaroxaban  20 mg Oral Q supper  . zinc sulfate  220 mg Oral Daily   Continuous Infusions: . remdesivir 100 mg in NS 100 mL 100 mg (02/19/20 0939)   PRN Meds:.acetaminophen, chlorpheniramine-HYDROcodone, guaiFENesin-dextromethorphan, ondansetron **OR** ondansetron (ZOFRAN) IV, polyethylene glycol, senna-docusate  Antimicrobials: Anti-infectives (From admission, onward)   Start     Dose/Rate Route Frequency Ordered Stop   02/18/20 1000  remdesivir 100 mg in sodium chloride 0.9 % 100 mL IVPB       "Followed by" Linked Group Details   100 mg 200 mL/hr over 30 Minutes Intravenous Daily 02/17/20 0328 02/22/20 0959   02/17/20 0400  remdesivir 200 mg in sodium chloride 0.9% 250 mL IVPB       "Followed by" Linked Group Details   200 mg 580 mL/hr over 30 Minutes Intravenous Once 02/17/20 0328 02/17/20 0645       I  have personally reviewed the following labs and images: CBC: Recent Labs  Lab 02/16/20 2345 02/17/20 0605 02/18/20 0454 02/19/20 0419  WBC 6.8 5.1 5.1 7.3  NEUTROABS 2.9 2.2 3.4 6.1  HGB 13.9 12.7* 12.0* 12.3*  HCT 42.0 38.3* 36.5* 36.5*  MCV 67.7* 67.7* 67.6* 67.0*  PLT 267 237 228 252   BMP &GFR Recent Labs  Lab 02/16/20 2345 02/17/20 0605 02/18/20 0454 02/19/20 0419  NA 143 140 137 137  K 4.4 4.0 4.6 4.6  CL 106 109 105 105  CO2 27 22 21* 24  GLUCOSE 116* 110* 110* 147*  BUN 21 18 22  31*  CREATININE 1.02 0.77 0.80 0.88  CALCIUM 9.5 8.7* 8.6* 8.8*  MG  --   --   --  2.1  PHOS  --   --   --  2.4*   Estimated Creatinine Clearance: 72.2 mL/min (by C-G formula based on SCr of 0.88 mg/dL). Liver & Pancreas: Recent Labs  Lab 02/17/20 0605 02/18/20 0454 02/19/20 0419  AST 33 33 57*  ALT 24 21 29   ALKPHOS 81 77 73  BILITOT 0.3 0.4 0.4  PROT 6.0* 6.4* 6.6  ALBUMIN 3.2* 3.3* 3.5   No results for input(s): LIPASE, AMYLASE in the last 168 hours. No results for input(s): AMMONIA in the last 168 hours. Diabetic: No results for input(s): HGBA1C in the last 72 hours. Recent Labs  Lab 02/17/20 1222  GLUCAP 167*   Cardiac Enzymes: No results for input(s): CKTOTAL, CKMB, CKMBINDEX, TROPONINI in the last 168 hours. No results for input(s): PROBNP in the last 8760 hours. Coagulation Profile: No results for input(s): INR, PROTIME in the last 168 hours. Thyroid Function Tests: No results for input(s): TSH, T4TOTAL, FREET4, T3FREE, THYROIDAB in the last 72 hours. Lipid Profile: No results for input(s): CHOL, HDL, LDLCALC, TRIG, CHOLHDL, LDLDIRECT in the last 72 hours. Anemia Panel: No results for input(s): VITAMINB12, FOLATE, FERRITIN, TIBC, IRON, RETICCTPCT in the last 72 hours. Urine analysis:    Component Value Date/Time   COLORURINE YELLOW 02/17/2020 1640   APPEARANCEUR CLOUDY (A) 02/17/2020 1640   LABSPEC 1.023 02/17/2020 1640   PHURINE 8.0 02/17/2020 1640    GLUCOSEU NEGATIVE 02/17/2020 1640   HGBUR NEGATIVE 02/17/2020 1640   BILIRUBINUR NEGATIVE 02/17/2020 1640   KETONESUR NEGATIVE 02/17/2020 1640   PROTEINUR 100 (A) 02/17/2020 1640   NITRITE POSITIVE (A) 02/17/2020 1640   LEUKOCYTESUR LARGE (A) 02/17/2020 1640   Sepsis Labs: Invalid input(s): PROCALCITONIN, [place]  Microbiology: Recent Results (from the past 240 hour(s))  SARS Coronavirus 2 by RT PCR (hospital order, performed in [facility] hospital lab) Nasopharyngeal Nasopharyngeal Swab     Status: Abnormal   Collection Time: 02/16/20 10:41 PM   Specimen: Nasopharyngeal Swab  Result Value Ref Range Status   SARS Coronavirus 2 POSITIVE (A) NEGATIVE Final    Comment: RESULT CALLED TO, READ BACK BY AND VERIFIED WITH: RN I HODGES AT 0225 02/17/20 [place]  (NOTE) SARS-CoV-2 target nucleic acids are DETECTED  SARS-CoV-2 RNA is generally detectable in upper respiratory specimens  during the acute phase of infection.  Positive results are indicative  of the presence of the identified virus, but do not rule out bacterial infection or co-infection with other pathogens not detected by the test.  Clinical correlation with patient history and  other diagnostic information is necessary to determine patient infection status.  The expected result is negative.  Fact Sheet for Patients:   StrictlyIdeas.no   Fact Sheet for Healthcare Providers:   BankingDealers.co.za    This test is not yet approved or cleared by the [place] FDA and  has been authorized for detection and/or diagnosis of SARS-CoV-2 by FDA under an Emergency Use Authorization (EUA).  This EUA will remain in effect (meaning th is test can be used) for the duration of  the COVID-19 declaration under Section 564(b)(1) of the Act, 21 U.S.C. section 360-bbb-3(b)(1), unless the authorization is terminated or revoked sooner.  Performed at [facility],  [place] [address], [place], [place] [address]     Radiology Studies: No results found.    [place] Chatmon T. Winfield  If 7PM-7AM, please contact night-coverage [email or web address] 02/19/2020, 4:02 PM

## 2020-02-19 NOTE — TOC Initial Note (Signed)
Transition of Care [facility]) - Initial/Assessment Note    Patient Details  Name: Richard [name] MRN: [id number] Date of Birth: 09/12/1940  Transition [facility]) CM/SW Contact:    Erenest Rasher, RN Phone Number: [phone] 02/19/2020, 3:46 PM  Clinical Narrative:                 TOC CM spoke to pt's dtr. Richard Bamberg. States she is pt's caregiver. He has RW and bedside commode. Pt may need oxygen for home. Dtr is requesting a call from attending. Updated attending.   Expected Discharge Plan: Home/Self Care     Patient Goals and CMS Choice Patient states their goals for this hospitalization and ongoing recovery are:: prefers patient comes home CMS Medicare.gov Compare Post Acute Care list provided to:: Patient Represenative (must comment) Richard [name]-daughter) Choice offered to / list presented to : Adult Children  Expected Discharge Plan and Services Expected Discharge Plan: Home/Self Care In-house Referral: Clinical Social Work Discharge Planning Services: CM Consult Post Acute Care Choice: [place] arrangements for the past 2 months: [place]                                      Prior Living Arrangements/Services Living arrangements for the past 2 months: Single Family Home Lives with:: Adult Children Patient language and need for interpreter reviewed:: Yes        Need for Family Participation in Patient Care: Yes (Comment) Care giver support system in place?: Yes (comment) Current home services: DME (rolling walker, bedside commode) Criminal Activity/Legal Involvement Pertinent to Current Situation/Hospitalization: No - Comment as needed  Activities of Daily Living Home Assistive Devices/Equipment: Walker (specify type), Other (Comment), Cane (specify quad or straight) (front wheeled walker, walk-in shower, single point cane) ADL Screening (condition at time of admission) Patient's cognitive ability adequate to safely complete daily  activities?: No Is the patient deaf or have difficulty hearing?: Yes Does the patient have difficulty seeing, even when wearing glasses/contacts?: No Does the patient have difficulty concentrating, remembering, or making decisions?: Yes Patient able to express need for assistance with ADLs?: Yes Does the patient have difficulty dressing or bathing?: Yes Independently performs ADLs?: No Communication: Independent Dressing (OT): Needs assistance Is this a change from baseline?: Change from baseline, expected to last >3 days Grooming: Needs assistance Is this a change from baseline?: Change from baseline, expected to last >3 days Feeding: Needs assistance Is this a change from baseline?: Change from baseline, expected to last >3 days Bathing: Needs assistance Is this a change from baseline?: Change from baseline, expected to last >3 days Toileting: Needs assistance Is this a change from baseline?: Change from baseline, expected to last >3days In/Out Bed: Needs assistance Is this a change from baseline?: Change from baseline, expected to last >3 days Walks in Home: Needs assistance Is this a change from baseline?: Change from baseline, expected to last >3 days Does the patient have difficulty walking or climbing stairs?: Yes (secondary to weakness) Weakness of Legs: Both Weakness of Arms/Hands: Both  Permission Sought/Granted Permission sought to share information with : Case Manager, PCP, Family Supports Permission granted to share information with : Yes, Verbal Permission Granted  Share Information with NAME: Richard [name]  Permission granted to share info w AGENCY: DME  Permission granted to share info w Relationship: daughter  Permission granted to share info w Contact Information: [phone]  Emotional Assessment           Psych Involvement: No (comment)  Admission diagnosis:  Acute respiratory disease due to COVID-19 virus [U07.1, J06.9] Acute respiratory failure due  to COVID-19 ([place]) [U07.1, J96.00] Patient Active Problem List   Diagnosis Date Noted  . Acute respiratory disease due to COVID-19 virus 02/17/2020  . Acute respiratory failure due to COVID-19 ([place]) 02/17/2020  . Syncope 11/04/2019  . Fall at home, initial encounter 11/04/2019  . Anemia 09/25/2019  . Symptomatic anemia 09/24/2019  . Paroxysmal atrial fibrillation ([place]) 07/06/2019  . Pressure injury of skin 05/21/2019  . Anorexia 05/20/2019  . HCAP (healthcare-associated pneumonia) 05/18/2019  . Protein-calorie malnutrition, severe ([place]) 05/18/2019  . Sepsis ([place]) 05/08/2019  . History of pulmonary embolism 03/30/2019  . Pure hypercholesterolemia 03/30/2019  . SOB (shortness of breath) 03/30/2019  . Palliative care by specialist   . DNR (do not resuscitate) discussion   . Poor appetite 01/05/2019  . Goals of care, counseling/discussion 12/28/2018  . Encounter for antineoplastic chemotherapy 12/28/2018  . Stage III squamous cell carcinoma of right lung ([place]) 12/28/2018  . Dementia without behavioral disturbance ([place]) 12/16/2018  . Lung mass 12/15/2018  . Pelvic mass in male 12/15/2018  . Postobstructive pneumonia 12/15/2018  . Hypertension    PCP:  [name], NP Pharmacy:   [place], [place] - [address] [place] [address] Phone: [phone] Fax: [phone]  [place]), [place] - [address] [place] [address] Phone: [phone] Fax: [place], [place] [address] Phone: [phone] Fax: [phone]     Social Determinants of Health ([place]) Interventions    Readmission Risk Interventions Readmission Risk Prevention Plan 05/30/2019  Transportation Screening Complete  Medication Review ([place]) Complete  PCP or  Specialist appointment within 3-5 days of discharge Complete  HRI or [place] Complete  SW Recovery Care/Counseling Consult Complete  [place] Not Applicable  Some recent data might be hidden

## 2020-02-20 ENCOUNTER — Other Ambulatory Visit: Payer: Self-pay

## 2020-02-20 LAB — C-REACTIVE PROTEIN: CRP: 0.8 mg/dL (ref <1.0)

## 2020-02-20 LAB — D-DIMER, QUANTITATIVE: D-Dimer, Quant: 0.75 ug/mL-FEU — ABNORMAL HIGH (ref 0.00–0.50)

## 2020-02-20 NOTE — ED Notes (Signed)
[place] sent to lab

## 2020-02-20 NOTE — Progress Notes (Signed)
PROGRESS NOTE  Richard [name] DUK:[id number] DOB: [birth year]   PCP: [name], NP  Patient is from: Home.  DOA: 02/16/2020 LOS: 3  Brief Narrative / Interim history: 79 year old male with history of stage III squamous cell carcinoma of the lung followed by Dr. Julien Nordmann, paroxysmal A. fib and PE on Xarelto, dementia and HTN presenting with acute shortness of breath for 1 day and admitted for COVID-19 infection. Not vaccinated.  Daughter and grand-daughter tested positive as well  In ED, COVID-19 PCR positive.  He had a negative test on 8/18.  HR 129.  RR 25.  Blood pressure was normal.  98% on RA but dropped to 92%.  He was started on 2 L by nasal cannula recovered to upper 90s to 100%.  CXR without acute finding.  CMP and CBC without significant finding.  Troponin negative.  Lactic acid 1.8.  Patient was started on Solu-Medrol progressively and admitted for COVID-19 infection.  Remains on room air.  Remains inpatient until 02/21/2020 to complete remdesivir infusion.  No reliable transport for outpatient infusion.   Subjective: Seen and examined earlier this morning.  No major events overnight or this morning.  No complaints but not a reliable historian.  He is awake and alert but only oriented to self.  Responds "shit no" to pain or trouble breathing.   Objective: Vitals:   02/20/20 0930 02/20/20 0945 02/20/20 1000 02/20/20 1038  BP: 115/79 126/79 128/68 127/65  Pulse: 75 74 82 81  Resp: (!) 26 (!) 21 (!) 27 (!) 22  Temp:   97.8 F (36.6 C) 97.8 F (36.6 C)  TempSrc:    Oral  SpO2: 97% 94% 97% 100%  Weight:      Height:        Intake/Output Summary (Last 24 hours) at 02/20/2020 1238 Last data filed at 02/20/2020 1118 Gross per 24 hour  Intake 355 ml  Output 150 ml  Net 205 ml   Filed Weights   02/16/20 2137  Weight: 72.6 kg    Examination:  GENERAL: No apparent distress.  Nontoxic. HEENT: MMM.  Vision and hearing grossly intact.  NECK: Supple.  No apparent JVD.  RESP:  95% on RA.  No IWOB.  Fair aeration bilaterally. CVS:  RRR. Heart sounds normal.  ABD/GI/GU: BS+. Abd soft, NTND.  MSK/EXT:  Moves extremities. No apparent deformity. No edema.  SKIN: no apparent skin lesion or wound NEURO: Awake and alert.  Oriented to self.  No apparent focal neuro deficit. PSYCH: Calm.  No agitation or distress.  Procedures:  None  Microbiology summarized: COVID-19 PCR positive.  Assessment & Plan: Acute respiratory failure with hypoxia due to COVID-19 infection-symptomatic for 1 day.  Desaturated to 87% when he stood up for gown change.  CXR without acute finding.  Mild elevated inflammatory markers but not sure if these are reliable given history of cancer and PE.  Inflammatory markers are downtrending. Recent Labs    02/18/20 0454 02/18/20 0458 02/19/20 0419 02/20/20 0556  DDIMER 1.76*  --  1.08* 0.75*  CRP  --  1.6* 0.9 0.8  -Continue remdesivir and Solu-Medrol-9/3>> -Supportive care with incentive spirometry, inhalers, mucolytic's, antitussive -Unreliable support system and transportation for outpatient remdesivir infusion. -Continue PPI while on steroids -PT/OT eval-no need identified.  Stage III squamous cell carcinoma of the lung followed by Dr. Julien Nordmann -Outpatient follow-up  Paroxysmal A. fib: Rate controlled without medication. -Continue home Xarelto  History of PE -Continue home Xarelto.  Dementia with behavioral disturbance: Oriented to  self and partial place only. Likely baseline per daughter.  -Reorientation and delirium precautions.  Pyuria/bacteriuria-recently treated for UTI.  UA with pyuria and bacteriuria.  Patient denies UTI symptoms but not a reliable historian.  Has history of incontinence.  Debility/physical deconditioning -PT/OT recommended bedside commode.  Body mass index is 21.7 kg/m. Nutrition Problem: Increased nutrient needs Etiology: catabolic illness (COVID 19, lung cancer) Signs/Symptoms: estimated needs   DVT  prophylaxis:   rivaroxaban (XARELTO) tablet 20 mg    Code Status: Full code-confirmed with daughter Family Communication: Updated patient's daughter over the phone.   Status is: Inpatient  Remains inpatient appropriate because:Unsafe d/c plan, IV treatments appropriate due to intensity of illness or inability to take PO and Inpatient level of care appropriate due to severity of illness.  Remains inpatient to complete remdesivir infusion due to lack of reliable transportation.    Dispo: The patient is from: Home              Anticipated d/c is to: Home              Anticipated d/c date is: 1 day              Patient currently is not medically stable to d/c.       Consultants:  None   Sch Meds:  Scheduled Meds: . vitamin C  500 mg Oral Daily  . docusate sodium  100 mg Oral Daily  . feeding supplement (ENSURE ENLIVE)  237 mL Oral TID BM  . ferrous sulfate  325 mg Oral Q breakfast  . methylPREDNISolone (SOLU-MEDROL) injection  0.5 mg/kg Intravenous Q12H  . multivitamin with minerals  1 tablet Oral Daily  . pantoprazole  40 mg Oral Daily  . rivaroxaban  20 mg Oral Q supper  . zinc sulfate  220 mg Oral Daily   Continuous Infusions: . remdesivir 100 mg in NS 100 mL 100 mg (02/20/20 1114)   PRN Meds:.acetaminophen, chlorpheniramine-HYDROcodone, guaiFENesin-dextromethorphan, ondansetron **OR** ondansetron (ZOFRAN) IV, polyethylene glycol, senna-docusate  Antimicrobials: Anti-infectives (From admission, onward)   Start     Dose/Rate Route Frequency Ordered Stop   02/18/20 1000  remdesivir 100 mg in sodium chloride 0.9 % 100 mL IVPB       "Followed by" Linked Group Details   100 mg 200 mL/hr over 30 Minutes Intravenous Daily 02/17/20 0328 02/22/20 0959   02/17/20 0400  remdesivir 200 mg in sodium chloride 0.9% 250 mL IVPB       "Followed by" Linked Group Details   200 mg 580 mL/hr over 30 Minutes Intravenous Once 02/17/20 0328 02/17/20 0645       I have personally  reviewed the following labs and images: CBC: Recent Labs  Lab 02/16/20 2345 02/17/20 0605 02/18/20 0454 02/19/20 0419  WBC 6.8 5.1 5.1 7.3  NEUTROABS 2.9 2.2 3.4 6.1  HGB 13.9 12.7* 12.0* 12.3*  HCT 42.0 38.3* 36.5* 36.5*  MCV 67.7* 67.7* 67.6* 67.0*  PLT 267 237 228 252   BMP &GFR Recent Labs  Lab 02/16/20 2345 02/17/20 0605 02/18/20 0454 02/19/20 0419  NA 143 140 137 137  K 4.4 4.0 4.6 4.6  CL 106 109 105 105  CO2 27 22 21* 24  GLUCOSE 116* 110* 110* 147*  BUN 21 18 22  31*  CREATININE 1.02 0.77 0.80 0.88  CALCIUM 9.5 8.7* 8.6* 8.8*  MG  --   --   --  2.1  PHOS  --   --   --  2.4*  Estimated Creatinine Clearance: 72.2 mL/min (by C-G formula based on SCr of 0.88 mg/dL). Liver & Pancreas: Recent Labs  Lab 02/17/20 0605 02/18/20 0454 02/19/20 0419  AST 33 33 57*  ALT 24 21 29   ALKPHOS 81 77 73  BILITOT 0.3 0.4 0.4  PROT 6.0* 6.4* 6.6  ALBUMIN 3.2* 3.3* 3.5   No results for input(s): LIPASE, AMYLASE in the last 168 hours. No results for input(s): AMMONIA in the last 168 hours. Diabetic: No results for input(s): HGBA1C in the last 72 hours. Recent Labs  Lab 02/17/20 1222  GLUCAP 167*   Cardiac Enzymes: No results for input(s): CKTOTAL, CKMB, CKMBINDEX, TROPONINI in the last 168 hours. No results for input(s): PROBNP in the last 8760 hours. Coagulation Profile: No results for input(s): INR, PROTIME in the last 168 hours. Thyroid Function Tests: No results for input(s): TSH, T4TOTAL, FREET4, T3FREE, THYROIDAB in the last 72 hours. Lipid Profile: No results for input(s): CHOL, HDL, LDLCALC, TRIG, CHOLHDL, LDLDIRECT in the last 72 hours. Anemia Panel: No results for input(s): VITAMINB12, FOLATE, FERRITIN, TIBC, IRON, RETICCTPCT in the last 72 hours. Urine analysis:    Component Value Date/Time   COLORURINE YELLOW 02/17/2020 1640   APPEARANCEUR CLOUDY (A) 02/17/2020 1640   LABSPEC 1.023 02/17/2020 1640   PHURINE 8.0 02/17/2020 1640   GLUCOSEU NEGATIVE  02/17/2020 1640   HGBUR NEGATIVE 02/17/2020 1640   BILIRUBINUR NEGATIVE 02/17/2020 1640   KETONESUR NEGATIVE 02/17/2020 1640   PROTEINUR 100 (A) 02/17/2020 1640   NITRITE POSITIVE (A) 02/17/2020 1640   LEUKOCYTESUR LARGE (A) 02/17/2020 1640   Sepsis Labs: Invalid input(s): PROCALCITONIN, [place]  Microbiology: Recent Results (from the past 240 hour(s))  SARS Coronavirus 2 by RT PCR (hospital order, performed in [facility] hospital lab) Nasopharyngeal Nasopharyngeal Swab     Status: Abnormal   Collection Time: 02/16/20 10:41 PM   Specimen: Nasopharyngeal Swab  Result Value Ref Range Status   SARS Coronavirus 2 POSITIVE (A) NEGATIVE Final    Comment: RESULT CALLED TO, READ BACK BY AND VERIFIED WITH: RN I HODGES AT 0225 02/17/20 [place]  (NOTE) SARS-CoV-2 target nucleic acids are DETECTED  SARS-CoV-2 RNA is generally detectable in upper respiratory specimens  during the acute phase of infection.  Positive results are indicative  of the presence of the identified virus, but do not rule out bacterial infection or co-infection with other pathogens not detected by the test.  Clinical correlation with patient history and  other diagnostic information is necessary to determine patient infection status.  The expected result is negative.  Fact Sheet for Patients:   StrictlyIdeas.no   Fact Sheet for Healthcare Providers:   BankingDealers.co.za    This test is not yet approved or cleared by the [place] FDA and  has been authorized for detection and/or diagnosis of SARS-CoV-2 by FDA under an Emergency Use Authorization (EUA).  This EUA will remain in effect (meaning th is test can be used) for the duration of  the COVID-19 declaration under Section 564(b)(1) of the Act, 21 U.S.C. section 360-bbb-3(b)(1), unless the authorization is terminated or revoked sooner.  Performed at [facility], [place] [address], [place], [place] [address]     Radiology Studies: No results found.    Berenise Hunton T. [place]  If 7PM-7AM, please contact night-coverage [email or web address] 02/20/2020, 12:38 PM

## 2020-02-20 NOTE — Progress Notes (Signed)
Attempt to place PIV with ultrasound guidance  Able to access vein but patient began yelling to stop procedure and pulling away arm.  Explained need for PIV for current medications.  Patient refusing anymore attempts at PIV.

## 2020-02-21 LAB — COMPREHENSIVE METABOLIC PANEL
ALT: 27 U/L (ref 0–44)
AST: 37 U/L (ref 15–41)
Albumin: 3.3 g/dL — ABNORMAL LOW (ref 3.5–5.0)
Alkaline Phosphatase: 73 U/L (ref 38–126)
Anion gap: 10 (ref 5–15)
BUN: 32 mg/dL — ABNORMAL HIGH (ref 8–23)
CO2: 26 mmol/L (ref 22–32)
Calcium: 9.2 mg/dL (ref 8.9–10.3)
Chloride: 105 mmol/L (ref 98–111)
Creatinine, Ser: 0.86 mg/dL (ref 0.61–1.24)
GFR calc Af Amer: >60 mL/min (ref >60)
GFR calc non Af Amer: >60 mL/min (ref >60)
Glucose, Bld: 153 mg/dL — ABNORMAL HIGH (ref 70–99)
Potassium: 4.4 mmol/L (ref 3.5–5.1)
Sodium: 141 mmol/L (ref 135–145)
Total Bilirubin: 0.5 mg/dL (ref 0.3–1.2)
Total Protein: 6.2 g/dL — ABNORMAL LOW (ref 6.5–8.1)

## 2020-02-21 LAB — CBC WITH DIFFERENTIAL/PLATELET
Abs Immature Granulocytes: 0.06 10*3/uL (ref 0.00–0.07)
Basophils Absolute: 0 10*3/uL (ref 0.0–0.1)
Basophils Relative: 0 %
Eosinophils Absolute: 0 10*3/uL (ref 0.0–0.5)
Eosinophils Relative: 0 %
HCT: 38 % — ABNORMAL LOW (ref 39.0–52.0)
Hemoglobin: 12.8 g/dL — ABNORMAL LOW (ref 13.0–17.0)
Immature Granulocytes: 0 %
Lymphocytes Relative: 12 %
Lymphs Abs: 1.7 10*3/uL (ref 0.7–4.0)
MCH: 22.7 pg — ABNORMAL LOW (ref 26.0–34.0)
MCHC: 33.7 g/dL (ref 30.0–36.0)
MCV: 67.3 fL — ABNORMAL LOW (ref 80.0–100.0)
Monocytes Absolute: 1.3 10*3/uL — ABNORMAL HIGH (ref 0.1–1.0)
Monocytes Relative: 9 %
Neutro Abs: 11.5 10*3/uL — ABNORMAL HIGH (ref 1.7–7.7)
Neutrophils Relative %: 79 %
Platelets: 280 10*3/uL (ref 150–400)
RBC: 5.65 MIL/uL (ref 4.22–5.81)
RDW: 16.2 % — ABNORMAL HIGH (ref 11.5–15.5)
WBC: 14.7 10*3/uL — ABNORMAL HIGH (ref 4.0–10.5)
nRBC: 0 % (ref 0.0–0.2)

## 2020-02-21 LAB — C-REACTIVE PROTEIN: CRP: 1.5 mg/dL — ABNORMAL HIGH (ref <1.0)

## 2020-02-21 LAB — URINE CULTURE: Culture: >=100000 — AB

## 2020-02-21 LAB — D-DIMER, QUANTITATIVE: D-Dimer, Quant: 0.72 ug/mL-FEU — ABNORMAL HIGH (ref 0.00–0.50)

## 2020-02-21 LAB — PHOSPHORUS: Phosphorus: 1.7 mg/dL — ABNORMAL LOW (ref 2.5–4.6)

## 2020-02-21 LAB — MAGNESIUM: Magnesium: 2.2 mg/dL (ref 1.7–2.4)

## 2020-02-21 MED ORDER — ADULT MULTIVITAMIN W/MINERALS CH: 1.0000 | ORAL_TABLET | Freq: Every day | ORAL | Status: AC

## 2020-02-21 NOTE — TOC Transition Note (Signed)
Transition of Care [facility]) - CM/SW Discharge Note   Patient Details  Name: Richard [name] MRN: [id number] Date of Birth: April 01, 1941  [facility] CM/SW Contact:  Trish Mage, LCSW Phone Number: 02/21/2020, 10:52 AM   Clinical Narrative:  Patient is scheduled for d/c today, has ride home.  SAT note seen and appreciated. [name] is not in need of home O2 according to SAT note.  Order for 3 in 1 seen and appreciated.  According to  Ms Marcheta Grammes note of 2 days ago, daughter states they already have one at home.  No further needs identified.  TOC sign off.    Final next level of care: Home/Self Care Barriers to Discharge: No Barriers Identified   Patient Goals and CMS Choice Patient states their goals for this hospitalization and ongoing recovery are:: prefers patient comes home CMS Medicare.gov Compare Post Acute Care list provided to:: Patient Represenative (must comment) [name]) Choice offered to / list presented to : Adult Children  Discharge Placement                       Discharge Plan and Services In-house Referral: Clinical Social Work Discharge Planning Services: CM Consult Post Acute Care Choice: Home Health                               Social Determinants of Health ([place]) Interventions     Readmission Risk Interventions Readmission Risk Prevention Plan 05/30/2019  Transportation Screening Complete  Medication Review (RN Care Manager) Complete  PCP or Specialist appointment within 3-5 days of discharge Complete  HRI or [place] Complete  SW Recovery Care/Counseling Consult Complete  [place] Not Applicable  Some recent data might be hidden

## 2020-02-21 NOTE — Discharge Summary (Signed)
Physician Discharge Summary  Richard [name] SWN:[id number] DOB: 1940-10-26 DOA: 02/16/2020  PCP: [name], NP  Admit date: 02/16/2020 Discharge date: 02/21/2020  Admitted From: Home Disposition: Home  Recommendations for Outpatient Follow-up:  1. Follow ups as below. 2. Please obtain CBC/BMP/Mag at follow up 3. Please follow up on the following pending results: None  Home Health: None required Equipment/Devices: 3 in 1 commode  Discharge Condition: Stable CODE STATUS: Full code   Follow-up Information    [name], NP. Schedule an appointment as soon as possible for a visit in 2 week(s).   Specialty: Internal Medicine Contact information: [place] [address] [phone]        Jerline Pain, MD .   Specialty: Cardiology Contact information: 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 Julien Nordmann, paroxysmal A. fib and PE on Xarelto, dementia and HTN presenting with acute shortness of breath for 1 day and admitted for COVID-19 infection. Not vaccinated.  Daughter and grand-daughter tested positive as well  In ED, COVID-19 PCR positive.  He had a negative test on 8/18.  HR 129.  RR 25.  Blood pressure was normal.  98% on RA but dropped to 92%.  He was started on 2 L by nasal cannula recovered to upper 90s to 100%.  CXR without acute finding.  CMP and CBC without significant finding.  Troponin negative.  Lactic acid 1.8.    Patient was started on Solu-Medrol progressively and admitted for COVID-19 infection. He received remdesivir for 4 days.  On the last day, he lost IV line and refused another IV line placement.  He did not require supplemental oxygen throughout his hospitalization except on the first day.  On the day of discharge, he was ambulated on room air and maintained  saturation above 98% without distress.  He was also evaluated by therapy and no need was identified except for 3 in 1 commode.  Patient's daughter has been updated regularly during his stay.  See individual problem list below for more hospital course.  Discharge Diagnoses:  Acute respiratory failure with hypoxia due to COVID-19 infection-symptomatic for 1 day. CXR without acute finding.  Mild elevated inflammatory markers but not sure if these are reliable given history of cancer and PE.  Inflammatory markers are downtrended.  Saturation 98% with ambulation on room air. Recent Labs (last 2 labs)         Recent Labs    02/18/20 0454 02/18/20 0458 02/19/20 0419 02/20/20 0556  DDIMER 1.76*  --  1.08* 0.75*  CRP  --  1.6* 0.9 0.8    -Continue remdesivir and Solu-Medrol-9/3>> 9/7.  Refused last dose of remdesivir after he lost IV line.  Stage III squamous cell carcinoma of the lung followed by Dr. Julien Nordmann -Outpatient follow-up  Paroxysmal A. fib: Rate controlled without medication. -Continue home Xarelto  History of PE -Continue home Xarelto.  Dementia with behavioral disturbance: Oriented to self and partial place only. Likely baseline per daughter.  -Reorientation and delirium precautions.  Pyuria/bacteriuria-recently treated for UTI.  UA with pyuria and bacteriuria.  Patient denies UTI symptoms but not a reliable historian.  Has history of chronic incontinence.  Debility/physical deconditioning -3 in 1 bedside commode as recommended by therapy.    Body mass index is 21.7 kg/m.  Nutrition Problem: Increased nutrient needs Etiology: catabolic illness (COVID 19, lung cancer) Signs/Symptoms: estimated needs     Discharge Exam: Vitals:   02/20/20 2205 02/21/20 0511  BP: 127/64 103/75  Pulse: 74 80  Resp: 20 18  Temp: 98.4 F (36.9 C) 97.9 F (36.6 C)  SpO2: 98% 99%    GENERAL: No apparent distress.  Nontoxic. HEENT: MMM.  Vision and hearing grossly intact.   NECK: Supple.  No apparent JVD.  RESP: On room air.  No IWOB.  Fair aeration bilaterally. CVS:  RRR. Heart sounds normal.  ABD/GI/GU: Bowel sounds present. Soft. Non tender.  MSK/EXT:  Moves extremities. No apparent deformity. No edema.  SKIN: no apparent skin lesion or wound NEURO: Awake, alert and oriented to self.  No apparent focal neuro deficit. PSYCH: Calm.  No distress or agitation.  Discharge Instructions  Discharge Instructions    Call MD for:  difficulty breathing, headache or visual disturbances   Complete by: As directed    Call MD for:  extreme fatigue   Complete by: As directed    Call MD for:  persistant dizziness or light-headedness   Complete by: As directed    Call MD for:  persistant nausea and vomiting   Complete by: As directed    Call MD for:  temperature >100.4   Complete by: As directed    Diet general   Complete by: As directed    Discharge instructions   Complete by: As directed    It has been a pleasure taking care of you!  You were hospitalized and treated for COVID-19 infection.  You were treated with steroids and remdesivir infusion.  You are still potentially infectious for the next 15 days. We recommend you isolate yourself and take the necessary precautions to prevent the virus from spreading.  Some of the steps to prevent the virus from spreading to others: Stay away from other and members of your household at least for 15 days. Let healthy household members care for children and pets, if possible. If you have to care for children or pets, wash your hands often and wear a mask. If possible, stay in your own room, separate from others. Use a different bathroom.Make sure that all people in your household wash their hands well and often. Leave your house only to seek medical care. Do not use public transport. Do not travel while you are sick. Wash your hands often with soap and water for 20 seconds. If soap and water are not available, use  alcohol-based hand sanitizer. Cough or sneeze into a tissue or your sleeve or elbow. Do not cough or sneeze into your hand or into the air. Wear a cloth face covering or face mask.  Return precautions: Get help or return to the hospital right away if: You have trouble breathing. You have pain or pressure in your chest. You have confusion. You have bluish lips and fingernails. You have difficulty waking from sleep. You have symptoms that get worse. These symptoms may represent a serious problem that is an emergency. Do not wait to see if the symptoms will go away. Get medical help right away. Call your local emergency services (911 in the [place] Do not drive yourself to the hospital. Let the emergency medical personnel know if you think you have COVID-19.  To protect yourself in the future:  Do not travel to areas where COVID-19 is a risk. The areas where COVID-19 is reported change often. To identify high-risk areas and travel  restrictions, check the CDC travel website: FatFares.com.br If you live in, or must travel to, an area where COVID-19 is a risk, take precautions to avoid infection. Stay away from people who are sick. Wash your hands often with soap and water for 20 seconds. If soap and water are not available, use an alcohol-based hand sanitizer. Avoid touching your mouth, face, eyes, or nose. Avoid going out in public, follow guidance from your state and local health authorities. If you must go out in public, wear a cloth face covering or face mask. Disinfect objects and surfaces that are frequently touched every day. This may include: Counters and tables. Doorknobs and light switches. Sinks and faucets. Electronics, such as phones, remote controls, keyboards, computers, and tablets.    Where to find more information Centers for Disease Control and Prevention: PurpleGadgets.be World Health Organization:  https://www.castaneda.info/   Increase activity slowly   Complete by: As directed      Allergies as of 02/21/2020   No Known Allergies     Medication List    STOP taking these medications   Dulcolax 5 MG EC tablet Generic drug: bisacodyl     TAKE these medications   ferrous sulfate 325 (65 FE) MG tablet Take 1 tablet (325 mg total) by mouth daily with breakfast.   multivitamin with minerals Tabs tablet Take 1 tablet by mouth daily.   pantoprazole 40 MG tablet Commonly known as: PROTONIX Take 1 tablet (40 mg total) by mouth daily.   polyethylene glycol 17 g packet Commonly known as: MIRALAX / GLYCOLAX Take 17 g by mouth 2 (two) times daily.   Stool Softener/Laxative 8.6-50 MG tablet Generic drug: senna-docusate TAKE 1 TABLET BY MOUTH TWICE DAILY   Xarelto 20 MG Tabs tablet Generic drug: rivaroxaban TAKE 1 TABLET BY MOUTH DAILY WITH SUPPER What changed: See the new instructions.            Durable Medical Equipment  (From admission, onward)         Start     Ordered   02/19/20 1402  For home use only DME 3 n 1  Once        02/19/20 1402   02/19/20 1023  For home use only DME Bedside commode  Once       Comments: 3 in 1 commode.  Question:  Patient needs a bedside commode to treat with the following condition  Answer:  Generalized weakness   02/19/20 1022          Consultations:  None  Procedures/Studies:   CT Angio Chest PE W/Cm &/Or Wo Cm  Result Date: 01/31/2020 CLINICAL DATA:  79 year old male with abdominal pain as well as tachycardia and tachypnea. Concern for pulmonary embolism. History of non-small cell lung cancer EXAM: CT ANGIOGRAPHY CHEST CT ABDOMEN AND PELVIS WITH CONTRAST TECHNIQUE: Multidetector CT imaging of the chest was performed using the standard protocol during bolus administration of intravenous contrast. Multiplanar CT image reconstructions and MIPs were obtained to evaluate the vascular anatomy. Multidetector CT  imaging of the abdomen and pelvis was performed using the standard protocol during bolus administration of intravenous contrast. CONTRAST:  155mL OMNIPAQUE IOHEXOL 350 MG/ML SOLN COMPARISON:  CT chest dated 12/02/2019 and abdomen pelvis dated 01/30/2019. FINDINGS: Evaluation of this exam is limited due to respiratory motion artifact. CTA CHEST FINDINGS Cardiovascular: There is no cardiomegaly or pericardial effusion. There is coronary vascular calcification of the RCA. There is mild atherosclerotic calcification of the aortic arch. No aneurysm or dissection. Evaluation  of the pulmonary arteries is limited due to respiratory motion artifact. No central pulmonary artery embolus identified. Mediastinum/Nodes: No hilar or mediastinal adenopathy. The esophagus and the thyroid gland are grossly unremarkable. No mediastinal fluid collection. Lungs/Pleura: Background of moderate to severe emphysema. Similar appearance of right perihilar scarring and right middle lobe consolidation and volume loss with air bronchograms and mild bronchiectasis, likely post radiation changes. Diffuse interstitial thickening involving the right lung base and inferior aspect of the right upper lobe again noted, likely chronic and related to post radiation changes. There is also areas of nodularity in this region which is similar to prior CT. A small right pleural effusion is again noted. No new consolidative changes. No pneumothorax. The central airways remain patent. A 9 mm nodular density in the upper trachea (59/3) is similar to prior CT. Although this may represent an adherent mucus debris, an endotracheal lesion is not entirely excluded. The central airways are patent. Musculoskeletal: There is osteopenia with degenerative changes of the spine. No acute osseous pathology. Review of the MIP images confirms the above findings. CT ABDOMEN and PELVIS FINDINGS No intra-abdominal free air or free fluid. Hepatobiliary: The liver is unremarkable.  No intrahepatic biliary ductal dilatation. The gallbladder is unremarkable. Pancreas: Unremarkable. No pancreatic ductal dilatation or surrounding inflammatory changes. Spleen: Normal in size without focal abnormality. Adrenals/Urinary Tract: The adrenal glands are unremarkable. There is no hydronephrosis on either side. There is symmetric enhancement and excretion of contrast by both kidneys. The visualized ureters appear unremarkable. There is diffuse thickened and trabeculated appearance of the bladder wall, likely related to chronic bladder outlet obstruction. Correlation with urinalysis recommended to exclude acute cystitis. Stomach/Bowel: There is no bowel obstruction or active inflammation. The appendix is normal. Vascular/Lymphatic: Advanced aortoiliac atherosclerotic disease. The IVC is grossly unremarkable. No portal venous gas. There is no adenopathy. Reproductive: Enlarged prostate gland with median lobe hypertrophy indenting the base of the bladder. Other: Streak artifact associated with left hip arthroplasty limits evaluation. Musculoskeletal: Osteopenia with degenerative changes of the spine. Left hip arthroplasty. No acute osseous pathology. Review of the MIP images confirms the above findings. IMPRESSION: 1. No acute intrathoracic, abdominal, or pelvic pathology. No CT evidence of central pulmonary artery embolus. 2. Stable post radiation changes of the right lung and stable small right pleural effusion. 3. A 9 mm nodular density in the upper trachea is similar to prior CT. Although this may represent an adherent mucus debris, an endotracheal lesion is not entirely excluded. 4. Enlarged prostate gland with median lobe hypertrophy indenting the base of the bladder. 5. Thickened and trabeculated appearance of the bladder wall likely related to chronic bladder outlet obstruction. Correlation with urinalysis recommended to exclude acute cystitis. 6. Aortic Atherosclerosis (ICD10-I70.0) and Emphysema  (ICD10-J43.9). Electronically Signed   By: Anner Crete M.D.   On: 01/31/2020 22:47   CT Abdomen Pelvis W Contrast  Result Date: 01/31/2020 CLINICAL DATA:  79 year old male with abdominal pain as well as tachycardia and tachypnea. Concern for pulmonary embolism. History of non-small cell lung cancer EXAM: CT ANGIOGRAPHY CHEST CT ABDOMEN AND PELVIS WITH CONTRAST TECHNIQUE: Multidetector CT imaging of the chest was performed using the standard protocol during bolus administration of intravenous contrast. Multiplanar CT image reconstructions and MIPs were obtained to evaluate the vascular anatomy. Multidetector CT imaging of the abdomen and pelvis was performed using the standard protocol during bolus administration of intravenous contrast. CONTRAST:  142mL OMNIPAQUE IOHEXOL 350 MG/ML SOLN COMPARISON:  CT chest dated 12/02/2019 and abdomen pelvis  dated 01/30/2019. FINDINGS: Evaluation of this exam is limited due to respiratory motion artifact. CTA CHEST FINDINGS Cardiovascular: There is no cardiomegaly or pericardial effusion. There is coronary vascular calcification of the RCA. There is mild atherosclerotic calcification of the aortic arch. No aneurysm or dissection. Evaluation of the pulmonary arteries is limited due to respiratory motion artifact. No central pulmonary artery embolus identified. Mediastinum/Nodes: No hilar or mediastinal adenopathy. The esophagus and the thyroid gland are grossly unremarkable. No mediastinal fluid collection. Lungs/Pleura: Background of moderate to severe emphysema. Similar appearance of right perihilar scarring and right middle lobe consolidation and volume loss with air bronchograms and mild bronchiectasis, likely post radiation changes. Diffuse interstitial thickening involving the right lung base and inferior aspect of the right upper lobe again noted, likely chronic and related to post radiation changes. There is also areas of nodularity in this region which is similar  to prior CT. A small right pleural effusion is again noted. No new consolidative changes. No pneumothorax. The central airways remain patent. A 9 mm nodular density in the upper trachea (59/3) is similar to prior CT. Although this may represent an adherent mucus debris, an endotracheal lesion is not entirely excluded. The central airways are patent. Musculoskeletal: There is osteopenia with degenerative changes of the spine. No acute osseous pathology. Review of the MIP images confirms the above findings. CT ABDOMEN and PELVIS FINDINGS No intra-abdominal free air or free fluid. Hepatobiliary: The liver is unremarkable. No intrahepatic biliary ductal dilatation. The gallbladder is unremarkable. Pancreas: Unremarkable. No pancreatic ductal dilatation or surrounding inflammatory changes. Spleen: Normal in size without focal abnormality. Adrenals/Urinary Tract: The adrenal glands are unremarkable. There is no hydronephrosis on either side. There is symmetric enhancement and excretion of contrast by both kidneys. The visualized ureters appear unremarkable. There is diffuse thickened and trabeculated appearance of the bladder wall, likely related to chronic bladder outlet obstruction. Correlation with urinalysis recommended to exclude acute cystitis. Stomach/Bowel: There is no bowel obstruction or active inflammation. The appendix is normal. Vascular/Lymphatic: Advanced aortoiliac atherosclerotic disease. The IVC is grossly unremarkable. No portal venous gas. There is no adenopathy. Reproductive: Enlarged prostate gland with median lobe hypertrophy indenting the base of the bladder. Other: Streak artifact associated with left hip arthroplasty limits evaluation. Musculoskeletal: Osteopenia with degenerative changes of the spine. Left hip arthroplasty. No acute osseous pathology. Review of the MIP images confirms the above findings. IMPRESSION: 1. No acute intrathoracic, abdominal, or pelvic pathology. No CT evidence of  central pulmonary artery embolus. 2. Stable post radiation changes of the right lung and stable small right pleural effusion. 3. A 9 mm nodular density in the upper trachea is similar to prior CT. Although this may represent an adherent mucus debris, an endotracheal lesion is not entirely excluded. 4. Enlarged prostate gland with median lobe hypertrophy indenting the base of the bladder. 5. Thickened and trabeculated appearance of the bladder wall likely related to chronic bladder outlet obstruction. Correlation with urinalysis recommended to exclude acute cystitis. 6. Aortic Atherosclerosis (ICD10-I70.0) and Emphysema (ICD10-J43.9). Electronically Signed   By: Anner Crete M.D.   On: 01/31/2020 22:47   DG Chest Port 1 View  Result Date: 02/16/2020 CLINICAL DATA:  Shortness of breath EXAM: PORTABLE CHEST 1 VIEW COMPARISON:  01/31/2020 FINDINGS: Right-greater-than-left bibasilar opacities, unchanged. Rightward mediastinal shift is also unchanged. No pleural effusion or pneumothorax. IMPRESSION: Unchanged right-greater-than-left bibasilar opacities. Electronically Signed   By: Ulyses Jarred M.D.   On: 02/16/2020 23:10   DG Chest [facility]  Result Date: 01/31/2020 CLINICAL DATA:  Question sepsis. EXAM: PORTABLE CHEST 1 VIEW COMPARISON:  Chest CT 12/02/2019.  Radiograph 11/04/2019 FINDINGS: Chronic volume loss in the right hemithorax with rightward mediastinal shift. Right pleural effusion is similar to prior exam, not well assessed on this AP. Patchy opacity in the right lower lung zone combination of airspace disease and chronic right middle lobe bronchiectasis/volume loss on CT. The left lung is clear. There is no pneumothorax. No acute osseous abnormalities are seen. IMPRESSION: Chronic volume loss in the right hemithorax with rightward mediastinal shift. Right pleural effusion is similar to prior exam. Patchy opacity in the right mid and lower lung zone corresponds to airspace disease and chronic right  middle lobe opacity on CT 2 months ago. Electronically Signed   By: Keith Rake M.D.   On: 01/31/2020 21:15        The results of significant diagnostics from this hospitalization (including imaging, microbiology, ancillary and laboratory) are listed below for reference.     Microbiology: Recent Results (from the past 240 hour(s))  SARS Coronavirus 2 by RT PCR (hospital order, performed in [facility] hospital lab) Nasopharyngeal Nasopharyngeal Swab     Status: Abnormal   Collection Time: 02/16/20 10:41 PM   Specimen: Nasopharyngeal Swab  Result Value Ref Range Status   SARS Coronavirus 2 POSITIVE (A) NEGATIVE Final    Comment: RESULT CALLED TO, READ BACK BY AND VERIFIED WITH: RN I HODGES AT 0225 02/17/20 [place]  (NOTE) SARS-CoV-2 target nucleic acids are DETECTED  SARS-CoV-2 RNA is generally detectable in upper respiratory specimens  during the acute phase of infection.  Positive results are indicative  of the presence of the identified virus, but do not rule out bacterial infection or co-infection with other pathogens not detected by the test.  Clinical correlation with patient history and  other diagnostic information is necessary to determine patient infection status.  The expected result is negative.  Fact Sheet for Patients:   StrictlyIdeas.no   Fact Sheet for Healthcare Providers:   BankingDealers.co.za    This test is not yet approved or cleared by the [place] FDA and  has been authorized for detection and/or diagnosis of SARS-CoV-2 by FDA under an Emergency Use Authorization (EUA).  This EUA will remain in effect (meaning th is test can be used) for the duration of  the COVID-19 declaration under Section 564(b)(1) of the Act, 21 U.S.C. section 360-bbb-3(b)(1), unless the authorization is terminated or revoked sooner.  Performed at [facility], [place] [address], [place], [place]  [address]   Culture, Urine     Status: Abnormal   Collection Time: 02/18/20  8:49 AM   Specimen: Urine, Random  Result Value Ref Range Status   Specimen Description   Final    URINE, RANDOM Performed at [facility] [address] [place], [place] [address]    Special Requests   Final    NONE Performed at [facility], [place] [address] [place], [place] [address]    Culture >=100,000 COLONIES/mL PROTEUS MIRABILIS (A)  Final   Report Status 02/21/2020 FINAL  Final   Organism ID, Bacteria PROTEUS MIRABILIS (A)  Final      Susceptibility   Proteus mirabilis - MIC*    AMPICILLIN <=2 SENSITIVE Sensitive     CEFAZOLIN <=4 SENSITIVE Sensitive     CEFTRIAXONE <=0.25 SENSITIVE Sensitive     CIPROFLOXACIN <=0.25 SENSITIVE Sensitive     GENTAMICIN <=1 SENSITIVE Sensitive  IMIPENEM 4 SENSITIVE Sensitive     NITROFURANTOIN 128 RESISTANT Resistant     TRIMETH/SULFA <=20 SENSITIVE Sensitive     AMPICILLIN/SULBACTAM <=2 SENSITIVE Sensitive     PIP/TAZO <=4 SENSITIVE Sensitive     * >=100,000 COLONIES/mL PROTEUS MIRABILIS     Labs: BNP (last 3 results) Recent Labs    01/31/20 2100  BNP 51.7   Basic Metabolic Panel: Recent Labs  Lab 02/16/20 2345 02/17/20 0605 02/18/20 0454 02/19/20 0419 02/21/20 0022  NA 143 140 137 137 141  K 4.4 4.0 4.6 4.6 4.4  CL 106 109 105 105 105  CO2 27 22 21* 24 26  GLUCOSE 116* 110* 110* 147* 153*  BUN 21 18 22  31* 32*  CREATININE 1.02 0.77 0.80 0.88 0.86  CALCIUM 9.5 8.7* 8.6* 8.8* 9.2  MG  --   --   --  2.1 2.2  PHOS  --   --   --  2.4* 1.7*   Liver Function Tests: Recent Labs  Lab 02/17/20 0605 02/18/20 0454 02/19/20 0419 02/21/20 0022  AST 33 33 57* 37  ALT 24 21 29 27   ALKPHOS 81 77 73 73  BILITOT 0.3 0.4 0.4 0.5  PROT 6.0* 6.4* 6.6 6.2*  ALBUMIN 3.2* 3.3* 3.5 3.3*   No results for input(s): LIPASE, AMYLASE in the last 168 hours. No results for input(s): AMMONIA in the last 168  hours. CBC: Recent Labs  Lab 02/16/20 2345 02/17/20 0605 02/18/20 0454 02/19/20 0419 02/21/20 0022  WBC 6.8 5.1 5.1 7.3 14.7*  NEUTROABS 2.9 2.2 3.4 6.1 11.5*  HGB 13.9 12.7* 12.0* 12.3* 12.8*  HCT 42.0 38.3* 36.5* 36.5* 38.0*  MCV 67.7* 67.7* 67.6* 67.0* 67.3*  PLT 267 237 228 252 280   Cardiac Enzymes: No results for input(s): CKTOTAL, CKMB, CKMBINDEX, TROPONINI in the last 168 hours. BNP: Invalid input(s): POCBNP CBG: Recent Labs  Lab 02/17/20 1222  GLUCAP 167*   D-Dimer Recent Labs    02/20/20 0556 02/21/20 0022  DDIMER 0.75* 0.72*   Hgb A1c No results for input(s): HGBA1C in the last 72 hours. Lipid Profile No results for input(s): CHOL, HDL, LDLCALC, TRIG, CHOLHDL, LDLDIRECT in the last 72 hours. Thyroid function studies No results for input(s): TSH, T4TOTAL, T3FREE, THYROIDAB in the last 72 hours.  Invalid input(s): FREET3 Anemia work up No results for input(s): VITAMINB12, FOLATE, FERRITIN, TIBC, IRON, RETICCTPCT in the last 72 hours. Urinalysis    Component Value Date/Time   COLORURINE YELLOW 02/17/2020 1640   APPEARANCEUR CLOUDY (A) 02/17/2020 1640   LABSPEC 1.023 02/17/2020 1640   PHURINE 8.0 02/17/2020 1640   GLUCOSEU NEGATIVE 02/17/2020 1640   HGBUR NEGATIVE 02/17/2020 1640   BILIRUBINUR NEGATIVE 02/17/2020 1640   KETONESUR NEGATIVE 02/17/2020 1640   PROTEINUR 100 (A) 02/17/2020 1640   NITRITE POSITIVE (A) 02/17/2020 1640   LEUKOCYTESUR LARGE (A) 02/17/2020 1640   Sepsis Labs Invalid input(s): PROCALCITONIN,  WBC,  LACTICIDVEN   Time coordinating discharge: 35 minutes  SIGNED:  Mercy Riding, MD  [facility] 02/21/2020, 2:41 PM  If 7PM-7AM, please contact night-coverage [email or web address]

## 2020-02-21 NOTE — Progress Notes (Signed)
SATURATION QUALIFICATIONS: (This note is used to comply with regulatory documentation for home oxygen)  Patient Saturations on Room Air at Rest = 100%  Patient Saturations on Room Air while Ambulating = 98%

## 2020-02-21 NOTE — Discharge Instructions (Signed)
10 Things You Can Do to Manage Your COVID-19 Symptoms at Home If you have possible or confirmed COVID-19: 1. Stay home from work and school. And stay away from other public places. If you must go out, avoid using any kind of public transportation, ridesharing, or taxis. 2. Monitor your symptoms carefully. If your symptoms get worse, call your healthcare provider immediately. 3. Get rest and stay hydrated. 4. If you have a medical appointment, call the healthcare provider ahead of time and tell them that you have or may have COVID-19. 5. For medical emergencies, call 911 and notify the dispatch personnel that you have or may have COVID-19. 6. Cover your cough and sneezes with a tissue or use the inside of your elbow. 7. Wash your hands often with soap and water for at least 20 seconds or clean your hands with an alcohol-based hand sanitizer that contains at least 60% alcohol. 8. As much as possible, stay in a specific room and away from other people in your home. Also, you should use a separate bathroom, if available. If you need to be around other people in or outside of the home, wear a mask. 9. Avoid sharing personal items with other people in your household, like dishes, towels, and bedding. 10. Clean all surfaces that are touched often, like counters, tabletops, and doorknobs. Use household cleaning sprays or wipes according to the label instructions. [email or web address] 12/15/2018 This information is not intended to replace advice given to you by your health care provider. Make sure you discuss any questions you have with your health care provider. Document Revised: 05/19/2019 Document Reviewed: 05/19/2019 Elsevier Patient Education  2020 [place].   COVID-19 COVID-19 is a respiratory infection that is caused by a virus called severe acute respiratory syndrome coronavirus 2 (SARS-CoV-2). The disease is also known as coronavirus disease or novel coronavirus. In some people, the virus may  not cause any symptoms. In others, it may cause a serious infection. The infection can get worse quickly and can lead to complications, such as:  Pneumonia, or infection of the lungs.  Acute respiratory distress syndrome or ARDS. This is a condition in which fluid build-up in the lungs prevents the lungs from filling with air and passing oxygen into the blood.  Acute respiratory failure. This is a condition in which there is not enough oxygen passing from the lungs to the body or when carbon dioxide is not passing from the lungs out of the body.  Sepsis or septic shock. This is a serious bodily reaction to an infection.  Blood clotting problems.  Secondary infections due to bacteria or fungus.  Organ failure. This is when your body's organs stop working. The virus that causes COVID-19 is contagious. This means that it can spread from person to person through droplets from coughs and sneezes (respiratory secretions). What are the causes? This illness is caused by a virus. You may catch the virus by:  Breathing in droplets from an infected person. Droplets can be spread by a person breathing, speaking, singing, coughing, or sneezing.  Touching something, like a table or a doorknob, that was exposed to the virus (contaminated) and then touching your mouth, nose, or eyes. What increases the risk? Risk for infection You are more likely to be infected with this virus if you:  Are within 6 feet (2 meters) of a person with COVID-19.  Provide care for or live with a person who is infected with COVID-19.  Spend time in crowded indoor spaces or  live in shared housing. Risk for serious illness You are more likely to become seriously ill from the virus if you:  Are 77 years of age or older. The higher your age, the more you are at risk for serious illness.  Live in a nursing home or long-term care facility.  Have cancer.  Have a long-term (chronic) disease such as: ? Chronic lung disease,  including chronic obstructive pulmonary disease or asthma. ? A long-term disease that lowers your body's ability to fight infection (immunocompromised). ? Heart disease, including heart failure, a condition in which the arteries that lead to the heart become narrow or blocked (coronary artery disease), a disease which makes the heart muscle thick, weak, or stiff (cardiomyopathy). ? Diabetes. ? Chronic kidney disease. ? Sickle cell disease, a condition in which red blood cells have an abnormal "sickle" shape. ? Liver disease.  Are obese. What are the signs or symptoms? Symptoms of this condition can range from mild to severe. Symptoms may appear any time from 2 to 14 days after being exposed to the virus. They include:  A fever or chills.  A cough.  Difficulty breathing.  Headaches, body aches, or muscle aches.  Runny or stuffy (congested) nose.  A sore throat.  New loss of taste or smell. Some people may also have stomach problems, such as nausea, vomiting, or diarrhea. Other people may not have any symptoms of COVID-19. How is this diagnosed? This condition may be diagnosed based on:  Your signs and symptoms, especially if: ? You live in an area with a COVID-19 outbreak. ? You recently traveled to or from an area where the virus is common. ? You provide care for or live with a person who was diagnosed with COVID-19. ? You were exposed to a person who was diagnosed with COVID-19.  A physical exam.  Lab tests, which may include: ? Taking a sample of fluid from the back of your nose and throat (nasopharyngeal fluid), your nose, or your throat using a swab. ? A sample of mucus from your lungs (sputum). ? Blood tests.  Imaging tests, which may include, X-rays, CT scan, or ultrasound. How is this treated? At present, there is no medicine to treat COVID-19. Medicines that treat other diseases are being used on a trial basis to see if they are effective against COVID-19. Your  health care provider will talk with you about ways to treat your symptoms. For most people, the infection is mild and can be managed at home with rest, fluids, and over-the-counter medicines. Treatment for a serious infection usually takes places in a hospital intensive care unit (ICU). It may include one or more of the following treatments. These treatments are given until your symptoms improve.  Receiving fluids and medicines through an IV.  Supplemental oxygen. Extra oxygen is given through a tube in the nose, a face mask, or a hood.  Positioning you to lie on your stomach (prone position). This makes it easier for oxygen to get into the lungs.  Continuous positive airway pressure (CPAP) or bi-level positive airway pressure (BPAP) machine. This treatment uses mild air pressure to keep the airways open. A tube that is connected to a motor delivers oxygen to the body.  Ventilator. This treatment moves air into and out of the lungs by using a tube that is placed in your windpipe.  Tracheostomy. This is a procedure to create a hole in the neck so that a breathing tube can be inserted.  Extracorporeal membrane  oxygenation (ECMO). This procedure gives the lungs a chance to recover by taking over the functions of the heart and lungs. It supplies oxygen to the body and removes carbon dioxide. Follow these instructions at home: Lifestyle  If you are sick, stay home except to get medical care. Your health care provider will tell you how long to stay home. Call your health care provider before you go for medical care.  Rest at home as told by your health care provider.  Do not use any products that contain nicotine or tobacco, such as cigarettes, e-cigarettes, and chewing tobacco. If you need help quitting, ask your health care provider.  Return to your normal activities as told by your health care provider. Ask your health care provider what activities are safe for you. General  instructions  Take over-the-counter and prescription medicines only as told by your health care provider.  Drink enough fluid to keep your urine pale yellow.  Keep all follow-up visits as told by your health care provider. This is important. How is this prevented?  There is no vaccine to help prevent COVID-19 infection. However, there are steps you can take to protect yourself and others from this virus. To protect yourself:   Do not travel to areas where COVID-19 is a risk. The areas where COVID-19 is reported change often. To identify high-risk areas and travel restrictions, check the CDC travel website: FatFares.com.br  If you live in, or must travel to, an area where COVID-19 is a risk, take precautions to avoid infection. ? Stay away from people who are sick. ? Wash your hands often with soap and water for 20 seconds. If soap and water are not available, use an alcohol-based hand sanitizer. ? Avoid touching your mouth, face, eyes, or nose. ? Avoid going out in public, follow guidance from your state and local health authorities. ? If you must go out in public, wear a cloth face covering or face mask. Make sure your mask covers your nose and mouth. ? Avoid crowded indoor spaces. Stay at least 6 feet (2 meters) away from others. ? Disinfect objects and surfaces that are frequently touched every day. This may include:  Counters and tables.  Doorknobs and light switches.  Sinks and faucets.  Electronics, such as phones, remote controls, keyboards, computers, and tablets. To protect others: If you have symptoms of COVID-19, take steps to prevent the virus from spreading to others.  If you think you have a COVID-19 infection, contact your health care provider right away. Tell your health care team that you think you may have a COVID-19 infection.  Stay home. Leave your house only to seek medical care. Do not use public transport.  Do not travel while you are  sick.  Wash your hands often with soap and water for 20 seconds. If soap and water are not available, use alcohol-based hand sanitizer.  Stay away from other members of your household. Let healthy household members care for children and pets, if possible. If you have to care for children or pets, wash your hands often and wear a mask. If possible, stay in your own room, separate from others. Use a different bathroom.  Make sure that all people in your household wash their hands well and often.  Cough or sneeze into a tissue or your sleeve or elbow. Do not cough or sneeze into your hand or into the air.  Wear a cloth face covering or face mask. Make sure your mask covers your nose  and mouth. Where to find more information  Centers for Disease Control and Prevention: PurpleGadgets.be  World Health Organization: https://www.castaneda.info/ Contact a health care provider if:  You live in or have traveled to an area where COVID-19 is a risk and you have symptoms of the infection.  You have had contact with someone who has COVID-19 and you have symptoms of the infection. Get help right away if:  You have trouble breathing.  You have pain or pressure in your chest.  You have confusion.  You have bluish lips and fingernails.  You have difficulty waking from sleep.  You have symptoms that get worse. These symptoms may represent a serious problem that is an emergency. Do not wait to see if the symptoms will go away. Get medical help right away. Call your local emergency services (911 in the [place] Do not drive yourself to the hospital. Let the emergency medical personnel know if you think you have COVID-19. Summary  COVID-19 is a respiratory infection that is caused by a virus. It is also known as coronavirus disease or novel coronavirus. It can cause serious infections, such as pneumonia, acute respiratory distress syndrome, acute respiratory failure,  or sepsis.  The virus that causes COVID-19 is contagious. This means that it can spread from person to person through droplets from breathing, speaking, singing, coughing, or sneezing.  You are more likely to develop a serious illness if you are 61 years of age or older, have a weak immune system, live in a nursing home, or have chronic disease.  There is no medicine to treat COVID-19. Your health care provider will talk with you about ways to treat your symptoms.  Take steps to protect yourself and others from infection. Wash your hands often and disinfect objects and surfaces that are frequently touched every day. Stay away from people who are sick and wear a mask if you are sick. This information is not intended to replace advice given to you by your health care provider. Make sure you discuss any questions you have with your health care provider. Document Revised: 04/01/2019 Document Reviewed: 07/08/2018 Elsevier Patient Education  2020 [place].  COVID-19: How to Protect Yourself and Others Know how it spreads  There is currently no vaccine to prevent coronavirus disease 2019 (COVID-19).  The best way to prevent illness is to avoid being exposed to this virus.  The virus is thought to spread mainly from person-to-person. ? Between people who are in close contact with one another (within about 6 feet). ? Through respiratory droplets produced when an infected person coughs, sneezes or talks. ? These droplets can land in the mouths or noses of people who are nearby or possibly be inhaled into the lungs. ? COVID-19 may be spread by people who are not showing symptoms. Everyone should Clean your hands often  Wash your hands often with soap and water for at least 20 seconds especially after you have been in a public place, or after blowing your nose, coughing, or sneezing.  If soap and water are not readily available, use a hand sanitizer that contains at least 60% alcohol. Cover  all surfaces of your hands and rub them together until they feel dry.  Avoid touching your eyes, nose, and mouth with unwashed hands. Avoid close contact  Limit contact with others as much as possible.  Avoid close contact with people who are sick.  Put distance between yourself and other people. ? Remember that some people without symptoms may be  able to spread virus. ? This is especially important for people who are at higher risk of getting very GainPain.com.cy Cover your mouth and nose with a mask when around others  You could spread COVID-19 to others even if you do not feel sick.  Everyone should wear a mask in public settings and when around people not living in their household, especially when social distancing is difficult to maintain. ? Masks should not be placed on young children under age 51, anyone who has trouble breathing, or is unconscious, incapacitated or otherwise unable to remove the mask without assistance.  The mask is meant to protect other people in case you are infected.  Do NOT use a facemask meant for a Dietitian.  Continue to keep about 6 feet between yourself and others. The mask is not a substitute for social distancing. Cover coughs and sneezes  Always cover your mouth and nose with a tissue when you cough or sneeze or use the inside of your elbow.  Throw used tissues in the trash.  Immediately wash your hands with soap and water for at least 20 seconds. If soap and water are not readily available, clean your hands with a hand sanitizer that contains at least 60% alcohol. Clean and disinfect  Clean AND disinfect frequently touched surfaces daily. This includes tables, doorknobs, light switches, countertops, handles, desks, phones, keyboards, toilets, faucets, and sinks. RackRewards.fr  If surfaces are  dirty, clean them: Use detergent or soap and water prior to disinfection.  Then, use a household disinfectant. You can see a list of EPA-registered household disinfectants here. [email or web address] 02/16/2019 This information is not intended to replace advice given to you by your health care provider. Make sure you discuss any questions you have with your health care provider. Document Revised: 02/24/2019 Document Reviewed: 12/23/2018 Elsevier Patient Education  [place].   COVID-19 Frequently Asked Questions COVID-19 (coronavirus disease) is an infection that is caused by a large family of viruses. Some viruses cause illness in people and others cause illness in animals like camels, cats, and bats. In some cases, the viruses that cause illness in animals can spread to humans. Where did the coronavirus come from? In December 2019, [place] told the [facility]) of several cases of lung disease (human respiratory illness). These cases were linked to an open seafood and livestock market in the city of [place]. The link to the seafood and livestock market suggests that the virus may have spread from animals to humans. However, since that first outbreak in December, the virus has also been shown to spread from person to person. What is the name of the disease and the virus? Disease name Early on, this disease was called novel coronavirus. This is because scientists determined that the disease was caused by a new (novel) respiratory virus. The World Health Organization [facility]) has now named the disease COVID-19, or coronavirus disease. Virus name The virus that causes the disease is called severe acute respiratory syndrome coronavirus 2 (SARS-CoV-2). More information on disease and virus naming World Health Organization [facility]): www.who.int/emergencies/diseases/novel-coronavirus-2019/technical-guidance/naming-the-coronavirus-disease-(covid-2019)-and-the-virus-that-causes-it Who is  at risk for complications from coronavirus disease? Some people may be at higher risk for complications from coronavirus disease. This includes older adults and people who have chronic diseases, such as heart disease, diabetes, and lung disease. If you are at higher risk for complications, take these extra precautions:  Stay home as much as possible.  Avoid social gatherings and travel.  Avoid close contact with others. Stay  at least 6 ft (2 m) away from others, if possible.  Wash your hands often with soap and water for at least 20 seconds.  Avoid touching your face, mouth, nose, or eyes.  Keep supplies on hand at home, such as food, medicine, and cleaning supplies.  If you must go out in public, wear a cloth face covering or face mask. Make sure your mask covers your nose and mouth. How does coronavirus disease spread? The virus that causes coronavirus disease spreads easily from person to person (is contagious). You may catch the virus by:  Breathing in droplets from an infected person. Droplets can be spread by a person breathing, speaking, singing, coughing, or sneezing.  Touching something, like a table or a doorknob, that was exposed to the virus (contaminated) and then touching your mouth, nose, or eyes. Can I get the virus from touching surfaces or objects? There is still a lot that we do not know about the virus that causes coronavirus disease. Scientists are basing a lot of information on what they know about similar viruses, such as:  Viruses cannot generally survive on surfaces for long. They need a human body (host) to survive.  It is more likely that the virus is spread by close contact with people who are sick (direct contact), such as through: ? Shaking hands or hugging. ? Breathing in respiratory droplets that travel through the air. Droplets can be spread by a person breathing, speaking, singing, coughing, or sneezing.  It is less likely that the virus is spread  when a person touches a surface or object that has the virus on it (indirect contact). The virus may be able to enter the body if the person touches a surface or object and then touches his or her face, eyes, nose, or mouth. Can a person spread the virus without having symptoms of the disease? It may be possible for the virus to spread before a person has symptoms of the disease, but this is most likely not the main way the virus is spreading. It is more likely for the virus to spread by being in close contact with people who are sick and breathing in the respiratory droplets spread by a person breathing, speaking, singing, coughing, or sneezing. What are the symptoms of coronavirus disease? Symptoms vary from person to person and can range from mild to severe. Symptoms may include:  Fever or chills.  Cough.  Difficulty breathing or feeling short of breath.  Headaches, body aches, or muscle aches.  Runny or stuffy (congested) nose.  Sore throat.  New loss of taste or smell.  Nausea, vomiting, or diarrhea. These symptoms can appear anywhere from 2 to 14 days after you have been exposed to the virus. Some people may not have any symptoms. If you develop symptoms, call your health care provider. People with severe symptoms may need hospital care. Should I be tested for this virus? Your health care provider will decide whether to test you based on your symptoms, history of exposure, and your risk factors. How does a health care provider test for this virus? Health care providers will collect samples to send for testing. Samples may include:  Taking a swab of fluid from the back of your nose and throat, your nose, or your throat.  Taking fluid from the lungs by having you cough up mucus (sputum) into a sterile cup.  Taking a blood sample. Is there a treatment or vaccine for this virus? Currently, there is no vaccine to  prevent coronavirus disease. Also, there are no medicines like  antibiotics or antivirals to treat the virus. A person who becomes sick is given supportive care, which means rest and fluids. A person may also relieve his or her symptoms by using over-the-counter medicines that treat sneezing, coughing, and runny nose. These are the same medicines that a person takes for the common cold. If you develop symptoms, call your health care provider. People with severe symptoms may need hospital care. What can I do to protect myself and my family from this virus?     You can protect yourself and your family by taking the same actions that you would take to prevent the spread of other viruses. Take the following actions:  Wash your hands often with soap and water for at least 20 seconds. If soap and water are not available, use alcohol-based hand sanitizer.  Avoid touching your face, mouth, nose, or eyes.  Cough or sneeze into a tissue, sleeve, or elbow. Do not cough or sneeze into your hand or the air. ? If you cough or sneeze into a tissue, throw it away immediately and wash your hands.  Disinfect objects and surfaces that you frequently touch every day.  Stay away from people who are sick.  Avoid going out in public, follow guidance from your state and local health authorities.  Avoid crowded indoor spaces. Stay at least 6 ft (2 m) away from others.  If you must go out in public, wear a cloth face covering or face mask. Make sure your mask covers your nose and mouth.  Stay home if you are sick, except to get medical care. Call your health care provider before you get medical care. Your health care provider will tell you how long to stay home.  Make sure your vaccines are up to date. Ask your health care provider what vaccines you need. What should I do if I need to travel? Follow travel recommendations from your local health authority, the CDC, and WHO. Travel information and advice  Centers for Disease Control and Prevention (CDC):  BodyEditor.hu  World Health Organization [facility]): ThirdIncome.ca Know the risks and take action to protect your health  You are at higher risk of getting coronavirus disease if you are traveling to areas with an outbreak or if you are exposed to travelers from areas with an outbreak.  Wash your hands often and practice good hygiene to lower the risk of catching or spreading the virus. What should I do if I am sick? General instructions to stop the spread of infection  Wash your hands often with soap and water for at least 20 seconds. If soap and water are not available, use alcohol-based hand sanitizer.  Cough or sneeze into a tissue, sleeve, or elbow. Do not cough or sneeze into your hand or the air.  If you cough or sneeze into a tissue, throw it away immediately and wash your hands.  Stay home unless you must get medical care. Call your health care provider or local health authority before you get medical care.  Avoid public areas. Do not take public transportation, if possible.  If you can, wear a mask if you must go out of the house or if you are in close contact with someone who is not sick. Make sure your mask covers your nose and mouth. Keep your home clean  Disinfect objects and surfaces that are frequently touched every day. This may include: ? Counters and tables. ? Doorknobs and light  switches. ? Sinks and faucets. ? Electronics such as phones, remote controls, keyboards, computers, and tablets.  Wash dishes in hot, soapy water or use a dishwasher. Air-dry your dishes.  Wash laundry in hot water. Prevent infecting other household members  Let healthy household members care for children and pets, if possible. If you have to care for children or pets, wash your hands often and wear a mask.  Sleep in a different bedroom or bed, if possible.  Do not share personal items, such  as razors, toothbrushes, deodorant, combs, brushes, towels, and washcloths. Where to find more information Centers for Disease Control and Prevention (CDC)  Information and news updates: https://www.butler-gonzalez.com/ World Health Organization [facility])  Information and news updates: MissExecutive.com.ee  Coronavirus health topic: https://www.castaneda.info/  Questions and answers on COVID-19: OpportunityDebt.at  Global tracker: who.sprinklr.com American Academy of Pediatrics (AAP)  Information for families: www.healthychildren.org/English/health-issues/conditions/chest-lungs/Pages/2019-Novel-Coronavirus.aspx The coronavirus situation is changing rapidly. Check your local health authority website or the CDC and [facility] websites for updates and news. When should I contact a health care provider?  Contact your health care provider if you have symptoms of an infection, such as fever or cough, and you: ? Have been near anyone who is known to have coronavirus disease. ? Have come into contact with a person who is suspected to have coronavirus disease. ? Have traveled to an area where there is an outbreak of COVID-19. When should I get emergency medical care?  Get help right away by calling your local emergency services (911 in the [place] if you have: ? Trouble breathing. ? Pain or pressure in your chest. ? Confusion. ? Blue-tinged lips and fingernails. ? Difficulty waking from sleep. ? Symptoms that get worse. Let the emergency medical personnel know if you think you have coronavirus disease. Summary  A new respiratory virus is spreading from person to person and causing COVID-19 (coronavirus disease).  The virus that causes COVID-19 appears to spread easily. It spreads from one person to another through droplets from breathing, speaking, singing, coughing, or sneezing.  Older adults and those with chronic  diseases are at higher risk of disease. If you are at higher risk for complications, take extra precautions.  There is currently no vaccine to prevent coronavirus disease. There are no medicines, such as antibiotics or antivirals, to treat the virus.  You can protect yourself and your family by washing your hands often, avoiding touching your face, and covering your coughs and sneezes. This information is not intended to replace advice given to you by your health care provider. Make sure you discuss any questions you have with your health care provider. Document Revised: 04/01/2019 Document Reviewed: 09/28/2018 Elsevier Patient Education  [place]: Quarantine vs. Isolation QUARANTINE keeps someone who was in close contact with someone who has COVID-19 away from others. If you had close contact with a person who has COVID-19  Stay home until 14 days after your last contact.  Check your temperature twice a day and watch for symptoms of COVID-19.  If possible, stay away from people who are at higher-risk for getting very sick from COVID-19. ISOLATION keeps someone who is sick or tested positive for COVID-19 without symptoms away from others, even in their own home. If you are sick and think or know you have COVID-19  Stay home until after ? At least 10 days since symptoms first appeared and ? At least 24 hours with no fever without fever-reducing medication and ? Symptoms have improved If you  tested positive for COVID-19 but do not have symptoms  Stay home until after ? 10 days have passed since your positive test If you live with others, stay in a specific "sick room" or area and away from other people or animals, including pets. Use a separate bathroom, if available. michellinders.com 01/03/2019 This information is not intended to replace advice given to you by your health care provider. Make sure you discuss any questions you have with your health care  provider. Document Revised: 05/19/2019 Document Reviewed: 05/19/2019 Elsevier Patient Education  [place].

## 2020-02-21 NOTE — Progress Notes (Signed)
Patient refusing peripheral iv line. Instructed to patient importance of access

## 2020-02-22 ENCOUNTER — Telehealth: Payer: Self-pay

## 2020-02-22 NOTE — Telephone Encounter (Signed)
Transition Care Management Follow-up Telephone Call Call completed with patient's daughter, [name]  Date of discharge and from where: 02/21/2020, [facility]   How have you been since you were released from the hospital? [name] said he is doing well, still has a cough but has been eating.   Any questions or concerns?  she would like him to receive the COVID vaccine at home.   This CM left a message for the [facility] homebound vaccine program # [phone] to inquire about the current procedure for a patient to receive the vaccine at home. He was diagnosed with COVID 02/16/2020. Call back requested to this CM.  Items Reviewed:  Did the pt receive and understand the discharge instructions provided?  Yes  Medications obtained and verified?  [name] said that he has all medications, nothing new ordered except vitamins and she said she has those.  She did not have any questions about his med regime   Any new allergies since your discharge? none reported   Do you have support at home?  he lives with his daughter, [name].   No home health or DME ordered.   Functional Questionnaire: (I = Independent and D = Dependent) ADLs: daughter provides needed assistance. He has a walker that he will use when he wants to use it. He also has a bedside commode,   Follow up appointments reviewed:   PCP Hospital f/u appt confirmed?Juluis Mire, NP televisit - 03/01/2020  Specialist Hospital f/u appt confirmed?  Next appointment scheduled is with oncology-06/04/2020  Are transportation arrangements needed?  he has used [facility] in the past and his daughter accompanies him   If their condition worsens, is the pt aware to call PCP or go to the Emergency Dept.?  yes  Was the patient provided with contact information for the PCP's office or ED?  she has the clinic phone number  Was to pt encouraged to call back with questions or concerns?   yes

## 2020-02-23 NOTE — Telephone Encounter (Signed)
Message received from [place], [place] with Homebound vaccine program requesting patient's name/MRN/DOB/phone number be left on the homebound vaccine program voicemail and they will follow up with the patient.  She noted that is recommended to wait 45 days after a COVID infection prior to receiving the vaccine.  Call placed to patient's daughter, [name] and informed her that she would be contacted about her father receiving the vaccine.  She was appreciative of the information.   Call placed to [facility] vaccine program # [phone], message left with patient information as requested by Ms [place], [place]

## 2020-02-24 ENCOUNTER — Telehealth: Payer: Self-pay | Admitting: Physician Assistant

## 2020-02-24 NOTE — Telephone Encounter (Signed)
I connected by phone with Richard [name] and/or patient's caregiver on 02/24/2020 at 4:26 PM to discuss the potential vaccination through our Homebound vaccination initiative.   Prevaccination Checklist for COVID-19 Vaccines  1.  Are you feeling sick today? no  2.  Have you ever received a dose of a COVID-19 vaccine?  no      If yes, which one? None   3.  Have you ever had an allergic reaction: (This would include a severe reaction [ e.g., anaphylaxis] that required treatment with epinephrine or EpiPen or that caused you to go to the hospital.  It would also include an allergic reaction that occurred within 4 hours that caused hives, swelling, or respiratory distress, including wheezing.) A.  A previous dose of COVID-19 vaccine. no  B.  A vaccine or injectable therapy that contains multiple components, one of which is a COVID-19 vaccine component, but it is not known which component elicited the immediate reaction. no  C.  Are you allergic to polyethylene glycol? no  D. Are you allergic to Polysorbate, which is found in some vaccines, film coated tablets and intravenous steroids?  no   4.  Have you ever had an allergic reaction to another vaccine (other than COVID-19 vaccine) or an injectable medication? (This would include a severe reaction [ e.g., anaphylaxis] that required treatment with epinephrine or EpiPen or that caused you to go to the hospital.  It would also include an allergic reaction that occurred within 4 hours that caused hives, swelling, or respiratory distress, including wheezing.)  no   5.  Have you ever had a severe allergic reaction (e.g., anaphylaxis) to something other than a component of the COVID-19 vaccine, or any vaccine or injectable medication?  This would include food, pet, venom, environmental, or oral medication allergies.  no   6.  Have you received any vaccine in the last 14 days? no   7.  Have you ever had a positive test for COVID-19 or has a doctor ever told you  that you had COVID-19?  yes, recently admitted.   8.  Have you received passive antibody therapy (monoclonal antibodies or convalescent serum) as a treatment for COVID-19? no   9.  Do you have a weakened immune system caused by something such as HIV infection or cancer or do you take immunosuppressive drugs or therapies?  no   10.  Do you have a bleeding disorder or are you taking a blood thinner? yes, Xarelto  11.  Are you pregnant or breast-feeding? no   12.  Do you have dermal fillers? no   __________________   This patient is a 79 y.o. male that meets the FDA criteria to receive homebound vaccination. Patient or parent/caregiver understands they have the option to accept or refuse homebound vaccination.  Patient passed the pre-screening checklist and would like to proceed with homebound vaccination.  Based on questionnaire above, I recommend the patient be observed for 30 minutes.  There are no other household members/caregivers who are also interested in receiving the vaccine.    Patient was recently admitted with covid and on quarantine.  Patient will be eligible for homebound vaccination on the following date: __10/18___.    Angelena Form 02/24/2020 4:26 PM

## 2020-02-29 ENCOUNTER — Other Ambulatory Visit: Payer: Self-pay | Admitting: Internal Medicine

## 2020-02-29 DIAGNOSIS — I2699 Other pulmonary embolism without acute cor pulmonale: Secondary | ICD-10-CM

## 2020-03-01 ENCOUNTER — Encounter (INDEPENDENT_AMBULATORY_CARE_PROVIDER_SITE_OTHER): Payer: Self-pay | Admitting: Primary Care

## 2020-03-01 ENCOUNTER — Other Ambulatory Visit: Payer: Self-pay

## 2020-03-01 ENCOUNTER — Ambulatory Visit (INDEPENDENT_AMBULATORY_CARE_PROVIDER_SITE_OTHER): Payer: Medicare HMO | Admitting: Primary Care

## 2020-03-01 ENCOUNTER — Emergency Department (HOSPITAL_COMMUNITY)
Admission: EM | Admit: 2020-03-01 | Discharge: 2020-03-02 | Disposition: A | Discharge status: Home / Self Care | Payer: Medicare HMO | Attending: Emergency Medicine | Admitting: Emergency Medicine

## 2020-03-01 DIAGNOSIS — R69 Illness, unspecified: Secondary | ICD-10-CM | POA: Diagnosis not present

## 2020-03-01 DIAGNOSIS — Z87891 Personal history of nicotine dependence: Secondary | ICD-10-CM | POA: Insufficient documentation

## 2020-03-01 DIAGNOSIS — J069 Acute upper respiratory infection, unspecified: Secondary | ICD-10-CM

## 2020-03-01 DIAGNOSIS — Z09 Encounter for follow-up examination after completed treatment for conditions other than malignant neoplasm: Secondary | ICD-10-CM

## 2020-03-01 DIAGNOSIS — G309 Alzheimer's disease, unspecified: Secondary | ICD-10-CM | POA: Diagnosis not present

## 2020-03-01 DIAGNOSIS — N3001 Acute cystitis with hematuria: Secondary | ICD-10-CM | POA: Diagnosis not present

## 2020-03-01 DIAGNOSIS — I10 Essential (primary) hypertension: Secondary | ICD-10-CM | POA: Diagnosis not present

## 2020-03-01 DIAGNOSIS — F0280 Dementia in other diseases classified elsewhere without behavioral disturbance: Secondary | ICD-10-CM | POA: Diagnosis not present

## 2020-03-01 DIAGNOSIS — R319 Hematuria, unspecified: Secondary | ICD-10-CM | POA: Diagnosis not present

## 2020-03-01 DIAGNOSIS — Z79899 Other long term (current) drug therapy: Secondary | ICD-10-CM | POA: Insufficient documentation

## 2020-03-01 DIAGNOSIS — U071 COVID-19: Secondary | ICD-10-CM

## 2020-03-01 DIAGNOSIS — R5381 Other malaise: Secondary | ICD-10-CM | POA: Diagnosis not present

## 2020-03-01 LAB — COMPREHENSIVE METABOLIC PANEL
ALT: 22 U/L (ref 0–44)
AST: 25 U/L (ref 15–41)
Albumin: 3.2 g/dL — ABNORMAL LOW (ref 3.5–5.0)
Alkaline Phosphatase: 82 U/L (ref 38–126)
Anion gap: 10 (ref 5–15)
BUN: 18 mg/dL (ref 8–23)
CO2: 27 mmol/L (ref 22–32)
Calcium: 9.5 mg/dL (ref 8.9–10.3)
Chloride: 108 mmol/L (ref 98–111)
Creatinine, Ser: 0.95 mg/dL (ref 0.61–1.24)
GFR calc Af Amer: >60 mL/min (ref >60)
GFR calc non Af Amer: >60 mL/min (ref >60)
Glucose, Bld: 98 mg/dL (ref 70–99)
Potassium: 4.8 mmol/L (ref 3.5–5.1)
Sodium: 145 mmol/L (ref 135–145)
Total Bilirubin: 0.4 mg/dL (ref 0.3–1.2)
Total Protein: 6.9 g/dL (ref 6.5–8.1)

## 2020-03-01 LAB — CBC
HCT: 40 % (ref 39.0–52.0)
Hemoglobin: 13.3 g/dL (ref 13.0–17.0)
MCH: 22.1 pg — ABNORMAL LOW (ref 26.0–34.0)
MCHC: 33.3 g/dL (ref 30.0–36.0)
MCV: 66.6 fL — ABNORMAL LOW (ref 80.0–100.0)
Platelets: 299 10*3/uL (ref 150–400)
RBC: 6.01 MIL/uL — ABNORMAL HIGH (ref 4.22–5.81)
RDW: 17.1 % — ABNORMAL HIGH (ref 11.5–15.5)
WBC: 4.9 10*3/uL (ref 4.0–10.5)
nRBC: 0 % (ref 0.0–0.2)

## 2020-03-01 MED ORDER — SODIUM CHLORIDE 0.9 % IV BOLUS
500.0000 mL | Freq: Once | INTRAVENOUS | Status: AC
  Administered 2020-03-01: 500 mL via INTRAVENOUS

## 2020-03-01 NOTE — Progress Notes (Signed)
Telephone Note  I connected with Richard [name] via his daughter Richard [name] on 03/01/20 at  9:30 AM EDT by telephone and verified that I am speaking with the correct person using two identifiers.  Patient is at home. I discussed the limitations, risks, security and privacy concerns of performing an evaluation and management service by telephone and the availability of in person appointments. I also discussed with the patient that there may be a patient responsible charge related to this service. The patient expressed understanding and agreed to proceed. [name] Np office at [facility]  History of Present Illness: Richard [name] is a 79 year old male who has dementia. Daughter concerns with  Admit date to the hospital was 02/16/20, patient was discharged from the hospital on 02/21/20, patient was admitted for: Acute respiratory failure with hypoxia secondary to COVID-19 infection.  Past Medical History:  Diagnosis Date  . Acute pulmonary embolism ([place]) 01/30/2019  . Acute respiratory failure with hypoxia ([place]) 01/30/2019  . AKI (acute kidney injury) ([place]) 01/30/2019  . Atrial fibrillation with RVR ([place])   . Dementia ([place])   . High cholesterol   . Hypertension   . Lung mass 12/2018  . Severe sepsis ([place]) 01/30/2019    No Known Allergies   Current Outpatient Medications on File Prior to Visit  Medication Sig Dispense Refill  . ferrous sulfate 325 (65 FE) MG tablet Take 1 tablet (325 mg total) by mouth daily with breakfast. 30 tablet 3  . Multiple Vitamin (MULTIVITAMIN WITH MINERALS) TABS tablet Take 1 tablet by mouth daily.    . STOOL SOFTENER/LAXATIVE 50-8.6 MG tablet TAKE 1 TABLET BY MOUTH TWICE DAILY (Patient taking differently: Take 1 tablet by mouth 2 (two) times daily. ) 30 tablet 0  . XARELTO 20 MG TABS tablet TAKE 1 TABLET BY MOUTH DAILY WITH SUPPER 30 tablet 0  . pantoprazole (PROTONIX) 40 MG tablet Take 1 tablet (40 mg total) by mouth daily. (Patient not taking: Reported on  02/17/2020) 30 tablet 0  . polyethylene glycol (MIRALAX / GLYCOLAX) 17 g packet Take 17 g by mouth 2 (two) times daily. (Patient not taking: Reported on 02/17/2020) 14 each 0   No current facility-administered medications on file prior to visit.    ROS: all negative except above.  Review of Systems  HENT: Positive for congestion.   Respiratory: Positive for cough and sputum production.   Psychiatric/Behavioral: Positive for memory loss.  All other systems reviewed and are negative.   Assessment and Plan: Richard [name] was seen today for hospitalization follow-up.  Diagnoses and all orders for this visit:  Hospital discharge follow-up Acute respiratory failure with hypoxia secondary to COVID-19 infection Recommendations for Outpatient Follow-up:  1. Please obtain CBC/BMP/Mag at follow up 2. Order placed for the future  Alzheimer's dementia without behavioral disturbance, unspecified timing of dementia onset ([place]). Chronic very supporttive family.  Acute respiratory disease due to COVID-19 virus patient was requiring nonrebreather was slowly weaned off to 2 L.  Chest x-ray was unremarkable EKG shows sinus tachycardia . Test results COVID +.  COVID-19 Vaccine .       Follow Up Instructions:    I discussed the assessment and treatment plan with the patient. The patient was provided an opportunity to ask questions and all were answered. The patient agreed with the plan and demonstrated an understanding of the instructions.   The patient was advised to call back or seek an in-person evaluation if the symptoms worsen or if the condition  fails to improve as anticipated. I provided 35 minutes of non-face-to-face time during this encounter. Time includes reviewing encounter , labs and imaging   [name], NP

## 2020-03-01 NOTE — ED Provider Notes (Signed)
[facility] DEPT Provider Note   CSN: [id number] Arrival date & time: 03/01/20  2128     History Chief Complaint  Patient presents with  . Hematuria    Richard [name] is a 79 y.o. male.  Patient with recent admission for covid, presents with foul smelling blood urine earlier today. Symptoms acute onset, moderate, episodic. Pt currently denies any pain. No abd pain or flank pain. Denies fever or chills. Denies chest pain or sob. No cough or uri symptoms. Pt is on anticoag therapy, no other abnormal bruising or bleeding. Denies scrotal or testicular pain or swelling. Normal appetite. No nvd.   The history is provided by the patient.  Hematuria Pertinent negatives include no chest pain, no abdominal pain, no headaches and no shortness of breath.       Past Medical History:  Diagnosis Date  . Acute pulmonary embolism ([place]) 01/30/2019  . Acute respiratory failure with hypoxia ([place]) 01/30/2019  . AKI (acute kidney injury) ([place]) 01/30/2019  . Atrial fibrillation with RVR ([place])   . Dementia ([place])   . High cholesterol   . Hypertension   . Lung mass 12/2018  . Severe sepsis ([place]) 01/30/2019    Patient Active Problem List   Diagnosis Date Noted  . Acute respiratory disease due to COVID-19 virus 02/17/2020  . Acute respiratory failure due to COVID-19 ([place]) 02/17/2020  . Syncope 11/04/2019  . Fall at home, initial encounter 11/04/2019  . Anemia 09/25/2019  . Symptomatic anemia 09/24/2019  . Paroxysmal atrial fibrillation ([place]) 07/06/2019  . Pressure injury of skin 05/21/2019  . Anorexia 05/20/2019  . HCAP (healthcare-associated pneumonia) 05/18/2019  . Protein-calorie malnutrition, severe ([place]) 05/18/2019  . Sepsis ([place]) 05/08/2019  . History of pulmonary embolism 03/30/2019  . Pure hypercholesterolemia 03/30/2019  . SOB (shortness of breath) 03/30/2019  . Palliative care by specialist   . DNR (do not resuscitate) discussion   . Poor appetite  01/05/2019  . Goals of care, counseling/discussion 12/28/2018  . Encounter for antineoplastic chemotherapy 12/28/2018  . 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 Dementia without behavioral disturbance ([place]) 12/16/2018  . Lung mass 12/15/2018  . Pelvic mass in male 12/15/2018  . Postobstructive pneumonia 12/15/2018  . Hypertension     Past Surgical History:  Procedure Laterality Date  . IR GASTROSTOMY TUBE MOD SED  05/25/2019  . IR GASTROSTOMY TUBE REMOVAL  09/08/2019  . JOINT REPLACEMENT    . VIDEO BRONCHOSCOPY WITH ENDOBRONCHIAL ULTRASOUND N/A 12/16/2018   Procedure: VIDEO BRONCHOSCOPY WITH ENDOBRONCHIAL ULTRASOUND AND FLUROSCOPY;  Surgeon: [name], MD;  Location: [facility];  Service: Pulmonary;  Laterality: N/A;       Family History  Problem Relation Age of Onset  . Stomach cancer Mother   . COPD Father   . Lung cancer Brother   . Lung cancer Brother     Social History   Tobacco Use  . Smoking status: Former Smoker    Packs/day: 1.00    Years: 60.00    Pack years: 60.00    Types: Cigarettes  . Smokeless tobacco: Never Used  Vaping Use  . Vaping Use: Never used  Substance Use Topics  . Alcohol use: Yes    Comment: occasional  . Drug use: No    Home Medications Prior to Admission medications   Medication Sig Start Date End Date Taking? Authorizing Provider  ferrous sulfate 325 (65 FE) MG tablet Take 1 tablet (325 mg total) by mouth daily  with breakfast. 09/27/19   Regalado, Jerald Kief A, MD  Multiple Vitamin (MULTIVITAMIN WITH MINERALS) TABS tablet Take 1 tablet by mouth daily. 02/21/20   Mercy Riding, MD  pantoprazole (PROTONIX) 40 MG tablet Take 1 tablet (40 mg total) by mouth daily. Patient not taking: Reported on 02/17/2020 09/28/19   Regalado, Jerald Kief A, MD  polyethylene glycol (MIRALAX / GLYCOLAX) 17 g packet Take 17 g by mouth 2 (two) times daily. Patient not taking: Reported on 02/17/2020 09/27/19   Regalado, Jerald Kief A, MD  STOOL  SOFTENER/LAXATIVE 50-8.6 MG tablet TAKE 1 TABLET BY MOUTH TWICE DAILY Patient taking differently: Take 1 tablet by mouth 2 (two) times daily.  01/02/20   Curt Bears, MD  XARELTO 20 MG TABS tablet TAKE 1 TABLET BY MOUTH DAILY WITH SUPPER 02/29/20   Curt Bears, MD    Allergies    Patient has no known allergies.  Review of Systems   Review of Systems  Constitutional: Negative for fever.  HENT: Negative for sore throat.   Eyes: Negative for redness.  Respiratory: Negative for cough and shortness of breath.   Cardiovascular: Negative for chest pain.  Gastrointestinal: Negative for abdominal pain, diarrhea and vomiting.  Genitourinary: Positive for hematuria. Negative for flank pain.  Musculoskeletal: Negative for back pain and neck pain.  Skin: Negative for rash.  Neurological: Negative for headaches.  Hematological: Does not bruise/bleed easily.  Psychiatric/Behavioral: Negative for confusion.    Physical Exam Updated Vital Signs BP (!) 99/53 (BP Location: Right Arm)   Pulse 95   Temp 98.2 F (36.8 C) (Oral)   Resp 18   Ht 1.854 m (6\' 1" )   Wt 72.6 kg   SpO2 99%   BMI 21.11 kg/m   Physical Exam Vitals and nursing note reviewed.  Constitutional:      Appearance: Normal appearance. He is well-developed.  HENT:     Head: Atraumatic.     Nose: Nose normal.     Mouth/Throat:     Mouth: Mucous membranes are moist.     Pharynx: Oropharynx is clear.  Eyes:     General: No scleral icterus.    Conjunctiva/sclera: Conjunctivae normal.  Neck:     Trachea: No tracheal deviation.  Cardiovascular:     Rate and Rhythm: Normal rate and regular rhythm.     Pulses: Normal pulses.     Heart sounds: Normal heart sounds. No murmur heard.  No friction rub. No gallop.   Pulmonary:     Effort: Pulmonary effort is normal. No accessory muscle usage or respiratory distress.     Breath sounds: Normal breath sounds.  Abdominal:     General: Bowel sounds are normal. There is no  distension.     Palpations: Abdomen is soft. There is no mass.     Tenderness: There is no abdominal tenderness. There is no guarding or rebound.     Hernia: No hernia is present.  Genitourinary:    Comments: No cva tenderness. Normal external gu exam. No penile discharge. Musculoskeletal:        General: No swelling.     Cervical back: Normal range of motion and neck supple. No rigidity.  Skin:    General: Skin is warm and dry.     Findings: No rash.  Neurological:     Mental Status: He is alert.     Comments: Alert, speech clear.   Psychiatric:        Mood and Affect: Mood normal.     ED  Results / Procedures / Treatments   Labs (all labs ordered are listed, but only abnormal results are displayed)  Labs pending   EKG None  Radiology No results found.  Procedures Procedures (including critical care time)  Medications Ordered in ED Medications  sodium chloride 0.9 % bolus 500 mL (has no administration in time range)    ED Course  I have reviewed the triage vital signs and the nursing notes.  Pertinent labs & imaging results that were available during my care of the patient were reviewed by me and considered in my medical decision making (see chart for details).    MDM Rules/Calculators/A&P                         Labs sent.  Reviewed nursing notes and prior charts for additional history. Recent admission for covid reviewed.   2315, labs pending. Recheck pt, denies pain or other specific c/o currently.    2320 Signed out to Dr Dayna Barker to check UA, and blood work when resulted, recheck pt, and disposition appropriately.     Final Clinical Impression(s) / ED Diagnoses Final diagnoses:  None    Rx / DC Orders ED Discharge Orders    None       Lajean Saver, MD 03/01/20 2321

## 2020-03-01 NOTE — Progress Notes (Signed)
Pt only has a cough daughter is giving him tylenol

## 2020-03-01 NOTE — ED Provider Notes (Signed)
10:58 PM Assumed care from Dr. Ashok Cordia, please see their note for full history, physical and decision making until this point. In brief this is a 79 y.o. year old male who presented to the ED tonight with Hematuria     79 yo M here recently with COVID. Now with malodorous, purulent appearing urine and hematuria. Soft pressure. Fluids ordered. Labs ordered. Patient appears well and likely can be discharged if everything appropriate and appears well on recheck.   Pressures stable. Infection on urine likely, culture sent, started rocephin. Discussed with daughter, dc for same.   Discharge instructions, including strict return precautions for new or worsening symptoms, given. Patient and/or family verbalized understanding and agreement with the plan as described.   Labs, studies and imaging reviewed by myself and considered in medical decision making if ordered. Imaging interpreted by radiology.  Labs Reviewed  CBC  COMPREHENSIVE METABOLIC PANEL  URINALYSIS, ROUTINE W REFLEX MICROSCOPIC    No orders to display    No follow-ups on file.    Roosvelt Churchwell, Corene Cornea, MD 03/02/20 [phone]

## 2020-03-01 NOTE — ED Triage Notes (Addendum)
Pt daughter reports, foul smelling bloody penile discharge. Pt tested (+) for covid on 02/16/2020

## 2020-03-02 DIAGNOSIS — M2550 Pain in unspecified joint: Secondary | ICD-10-CM | POA: Diagnosis not present

## 2020-03-02 DIAGNOSIS — R4182 Altered mental status, unspecified: Secondary | ICD-10-CM | POA: Diagnosis not present

## 2020-03-02 DIAGNOSIS — Z7401 Bed confinement status: Secondary | ICD-10-CM | POA: Diagnosis not present

## 2020-03-02 DIAGNOSIS — R0902 Hypoxemia: Secondary | ICD-10-CM | POA: Diagnosis not present

## 2020-03-02 DIAGNOSIS — I959 Hypotension, unspecified: Secondary | ICD-10-CM | POA: Diagnosis not present

## 2020-03-02 LAB — URINALYSIS, ROUTINE W REFLEX MICROSCOPIC

## 2020-03-02 LAB — URINALYSIS, MICROSCOPIC (REFLEX)
RBC / HPF: >50 RBC/hpf (ref 0–5)
Squamous Epithelial / HPF: NONE SEEN (ref 0–5)

## 2020-03-02 LAB — LACTIC ACID, PLASMA: Lactic Acid, Venous: 1.1 mmol/L (ref 0.5–1.9)

## 2020-03-02 MED ORDER — LACTATED RINGERS IV BOLUS
1000.0000 mL | Freq: Once | INTRAVENOUS | Status: AC
  Administered 2020-03-02: 1000 mL via INTRAVENOUS

## 2020-03-02 MED ORDER — CEPHALEXIN 500 MG PO CAPS: 500.0000 mg | ORAL_CAPSULE | Freq: Four times a day (QID) | ORAL | 0 refills | Status: DC

## 2020-03-02 MED ORDER — SODIUM CHLORIDE 0.9 % IV SOLN
1.0000 g | Freq: Once | INTRAVENOUS | Status: AC
  Administered 2020-03-02: 1 g via INTRAVENOUS
  Filled 2020-03-02: qty 10

## 2020-03-02 NOTE — ED Notes (Signed)
No urine sample provided at this time. Will continue to monitor.

## 2020-03-05 LAB — URINE CULTURE: Culture: >=100000 — AB

## 2020-03-06 ENCOUNTER — Telehealth: Payer: Self-pay | Admitting: Emergency Medicine

## 2020-03-06 NOTE — Telephone Encounter (Signed)
Post ED Visit - Positive Culture Follow-up  Culture report reviewed by antimicrobial stewardship pharmacist: [place] Team []  Elenor Quinones, Pharm.D. []  Heide Guile, Pharm.D., BCPS AQ-ID []  Parks Neptune, Pharm.D., BCPS []  Alycia Rossetti, Pharm.D., BCPS []  [place], Pharm.D., BCPS, AAHIVP []  Legrand Como, Pharm.D., BCPS, AAHIVP []  Salome Arnt, PharmD, BCPS []  Johnnette Gourd, PharmD, BCPS []  Hughes Better, PharmD, BCPS []  Leeroy Cha, PharmD []  Laqueta Linden, PharmD, BCPS []  Albertina Parr, PharmD  [place] Team [x]  Leodis Sias, PharmD []  Lindell Spar, PharmD []  Royetta Asal, PharmD []  Graylin Shiver, Rph []  Rema Fendt) Glennon Mac, PharmD []  Arlyn Dunning, PharmD []  Netta Cedars, PharmD []  Dia Sitter, PharmD []  Leone Haven, PharmD []  Gretta Arab, PharmD []  Theodis Shove, PharmD []  Peggyann Juba, PharmD []  Reuel Boom, PharmD   Positive urine culture Treated with cephalexin, organism sensitive to the same and no further patient follow-up is required at this time.  Hazle Nordmann 03/06/2020, 10:53 AM

## 2020-03-28 ENCOUNTER — Encounter: Payer: Self-pay | Admitting: Internal Medicine

## 2020-03-28 ENCOUNTER — Telehealth: Payer: Self-pay | Admitting: Medical Oncology

## 2020-03-28 NOTE — Telephone Encounter (Signed)
Xarelto refill requested but pt cannot afford it.  Pt assistance application returned to [employer].

## 2020-03-28 NOTE — Progress Notes (Signed)
Received forwarded call from patient's daughter regarding assistance for Xarelto.  Advised a message was left to return my call back in August to have completed. Advised I can mail her the portion she needs to complete and return to be submitted to [employer] for processing. Left new physician form with Diane RN for provider to sign off on and return to me pending return documents from patient/daughter. She had a question whether or not she could purchase a half fill at pharmacy until resolved. Advised her to contact pharmacy. She verbalized understanding.  Placed patient portion of application in outgoing mail along with my card.

## 2020-03-28 NOTE — Progress Notes (Signed)
Received signed physician form for Xarelto from [place].  Holding for return of patient form for all to be faxed to [employer] for patient assistance.

## 2020-04-03 ENCOUNTER — Ambulatory Visit: Payer: Medicare HMO

## 2020-04-04 ENCOUNTER — Other Ambulatory Visit: Payer: Self-pay

## 2020-04-04 ENCOUNTER — Ambulatory Visit: Payer: Medicare HMO | Attending: Internal Medicine

## 2020-04-04 DIAGNOSIS — Z23 Encounter for immunization: Secondary | ICD-10-CM

## 2020-04-04 NOTE — Progress Notes (Signed)
   Covid-19 Vaccination Clinic  Name:  Richard [name]    MRN: [id number] DOB: June 15, 1941  04/04/2020  [name] was observed post Covid-19 immunization for 15 minutes without incident. He was provided with Vaccine Information Sheet and instruction to access the V-Safe system.   [name] was instructed to call 911 with any severe reactions post vaccine: Marland Kitchen Difficulty breathing  . Swelling of face and throat  . A fast heartbeat  . A bad rash all over body  . Dizziness and weakness   Immunizations Administered    Name Date Dose VIS Date Route   Pfizer COVID-19 Vaccine 04/04/2020 12:30 PM 0.3 mL 08/10/2018 Intramuscular   Manufacturer: [place]   Lot: [id number]   [place]: [id number]

## 2020-04-06 ENCOUNTER — Other Ambulatory Visit: Payer: Self-pay

## 2020-04-06 ENCOUNTER — Emergency Department (HOSPITAL_COMMUNITY)
Admission: EM | Admit: 2020-04-06 | Discharge: 2020-04-07 | Disposition: A | Discharge status: Home / Self Care | Payer: Medicare HMO | Attending: Emergency Medicine | Admitting: Emergency Medicine

## 2020-04-06 DIAGNOSIS — I10 Essential (primary) hypertension: Secondary | ICD-10-CM | POA: Diagnosis not present

## 2020-04-06 DIAGNOSIS — N3001 Acute cystitis with hematuria: Secondary | ICD-10-CM | POA: Insufficient documentation

## 2020-04-06 DIAGNOSIS — Z86711 Personal history of pulmonary embolism: Secondary | ICD-10-CM | POA: Insufficient documentation

## 2020-04-06 DIAGNOSIS — F0390 Unspecified dementia without behavioral disturbance: Secondary | ICD-10-CM | POA: Insufficient documentation

## 2020-04-06 DIAGNOSIS — Z7901 Long term (current) use of anticoagulants: Secondary | ICD-10-CM | POA: Diagnosis not present

## 2020-04-06 DIAGNOSIS — Z87891 Personal history of nicotine dependence: Secondary | ICD-10-CM | POA: Diagnosis not present

## 2020-04-06 DIAGNOSIS — R410 Disorientation, unspecified: Secondary | ICD-10-CM | POA: Diagnosis not present

## 2020-04-06 DIAGNOSIS — R319 Hematuria, unspecified: Secondary | ICD-10-CM | POA: Diagnosis not present

## 2020-04-06 DIAGNOSIS — R6510 Systemic inflammatory response syndrome (SIRS) of non-infectious origin without acute organ dysfunction: Secondary | ICD-10-CM | POA: Diagnosis not present

## 2020-04-06 DIAGNOSIS — Z8616 Personal history of COVID-19: Secondary | ICD-10-CM | POA: Insufficient documentation

## 2020-04-06 DIAGNOSIS — R404 Transient alteration of awareness: Secondary | ICD-10-CM | POA: Diagnosis not present

## 2020-04-06 DIAGNOSIS — I480 Paroxysmal atrial fibrillation: Secondary | ICD-10-CM | POA: Diagnosis not present

## 2020-04-06 DIAGNOSIS — R000 Tachycardia, unspecified: Secondary | ICD-10-CM | POA: Diagnosis not present

## 2020-04-06 DIAGNOSIS — Z85118 Personal history of other malignant neoplasm of bronchus and lung: Secondary | ICD-10-CM | POA: Diagnosis not present

## 2020-04-06 DIAGNOSIS — R69 Illness, unspecified: Secondary | ICD-10-CM | POA: Diagnosis not present

## 2020-04-06 LAB — CBC WITH DIFFERENTIAL/PLATELET
Abs Immature Granulocytes: 0 10*3/uL (ref 0.00–0.07)
Band Neutrophils: 0 %
Basophils Absolute: 0.1 10*3/uL (ref 0.0–0.1)
Basophils Relative: 1 %
Blasts: 0 %
Eosinophils Absolute: 0.1 10*3/uL (ref 0.0–0.5)
Eosinophils Relative: 1 %
HCT: 37.9 % — ABNORMAL LOW (ref 39.0–52.0)
Hemoglobin: 12.3 g/dL — ABNORMAL LOW (ref 13.0–17.0)
Lymphocytes Relative: 30 %
Lymphs Abs: 1.7 10*3/uL (ref 0.7–4.0)
MCH: 21.9 pg — ABNORMAL LOW (ref 26.0–34.0)
MCHC: 32.5 g/dL (ref 30.0–36.0)
MCV: 67.4 fL — ABNORMAL LOW (ref 80.0–100.0)
Metamyelocytes Relative: 0 %
Monocytes Absolute: 0.8 10*3/uL (ref 0.1–1.0)
Monocytes Relative: 13 %
Myelocytes: 0 %
Neutro Abs: 3.1 10*3/uL (ref 1.7–7.7)
Neutrophils Relative %: 55 %
Other: 0 %
Platelets: 350 10*3/uL (ref 150–400)
Promyelocytes Relative: 0 %
RBC: 5.62 MIL/uL (ref 4.22–5.81)
RDW: 17.9 % — ABNORMAL HIGH (ref 11.5–15.5)
WBC: 5.8 10*3/uL (ref 4.0–10.5)
nRBC: 0 % (ref 0.0–0.2)
nRBC: 0 /100 WBC

## 2020-04-06 LAB — URINALYSIS, ROUTINE W REFLEX MICROSCOPIC: RBC / HPF: >50 RBC/hpf — ABNORMAL HIGH (ref 0–5)

## 2020-04-06 LAB — BASIC METABOLIC PANEL
Anion gap: 13 (ref 5–15)
BUN: 21 mg/dL (ref 8–23)
CO2: 22 mmol/L (ref 22–32)
Calcium: 9.8 mg/dL (ref 8.9–10.3)
Chloride: 113 mmol/L — ABNORMAL HIGH (ref 98–111)
Creatinine, Ser: 0.93 mg/dL (ref 0.61–1.24)
GFR, Estimated: >60 mL/min (ref >60)
Glucose, Bld: 138 mg/dL — ABNORMAL HIGH (ref 70–99)
Potassium: 4 mmol/L (ref 3.5–5.1)
Sodium: 148 mmol/L — ABNORMAL HIGH (ref 135–145)

## 2020-04-06 LAB — LACTIC ACID, PLASMA: Lactic Acid, Venous: 2.5 mmol/L (ref 0.5–1.9)

## 2020-04-06 MED ORDER — LACTATED RINGERS IV BOLUS
500.0000 mL | Freq: Once | INTRAVENOUS | Status: AC
  Administered 2020-04-06: 500 mL via INTRAVENOUS

## 2020-04-06 NOTE — ED Provider Notes (Signed)
[facility] DEPT Provider Note   CSN: [id number] Arrival date & time: 04/06/20  2110     History Chief Complaint  Patient presents with  . Hematuria    LEVEL 5 CAVEAT 2/2 DEMENTIA  Richard [name] is a 79 y.o. male.  79 year old male with a history of PE and A. fib with RVR (on chronic Xarelto), hypertension, dyslipidemia, dementia presents to the emergency department for hematuria.  Presenting from home where he is cared for by his daughter.  She states that he is resistant to urinating in a urinal and, instead, chooses to void on his bed on chucks.  She began noticing blood in his urine today which has become more profound.  Patient is confused at baseline and daughter denies any worsening of his dementia.  She has not noted any fevers, vomiting at home.  He did complete a course of antibiotics 1 month ago for hematuria secondary to UTI.  The history is provided by the patient. No language interpreter was used.  Hematuria       Past Medical History:  Diagnosis Date  . Acute pulmonary embolism ([place]) 01/30/2019  . Acute respiratory failure with hypoxia ([place]) 01/30/2019  . AKI (acute kidney injury) ([place]) 01/30/2019  . Atrial fibrillation with RVR ([place])   . Dementia ([place])   . High cholesterol   . Hypertension   . Lung mass 12/2018  . Severe sepsis ([place]) 01/30/2019    Patient Active Problem List   Diagnosis Date Noted  . Acute respiratory disease due to COVID-19 virus 02/17/2020  . Acute respiratory failure due to COVID-19 ([place]) 02/17/2020  . Syncope 11/04/2019  . Fall at home, initial encounter 11/04/2019  . Anemia 09/25/2019  . Symptomatic anemia 09/24/2019  . Paroxysmal atrial fibrillation ([place]) 07/06/2019  . Pressure injury of skin 05/21/2019  . Anorexia 05/20/2019  . HCAP (healthcare-associated pneumonia) 05/18/2019  . Protein-calorie malnutrition, severe ([facility]) 05/18/2019  . Sepsis ([place]) 05/08/2019  . History of pulmonary embolism  03/30/2019  . Pure hypercholesterolemia 03/30/2019  . SOB (shortness of breath) 03/30/2019  . Palliative care by specialist   . DNR (do not resuscitate) discussion   . Poor appetite 01/05/2019  . Goals of care, counseling/discussion 12/28/2018  . Encounter for antineoplastic chemotherapy 12/28/2018  . 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 Dementia without behavioral disturbance ([place]) 12/16/2018  . Lung mass 12/15/2018  . Pelvic mass in male 12/15/2018  . Postobstructive pneumonia 12/15/2018  . Hypertension     Past Surgical History:  Procedure Laterality Date  . IR GASTROSTOMY TUBE MOD SED  05/25/2019  . IR GASTROSTOMY TUBE REMOVAL  09/08/2019  . JOINT REPLACEMENT    . VIDEO BRONCHOSCOPY WITH ENDOBRONCHIAL ULTRASOUND N/A 12/16/2018   Procedure: VIDEO BRONCHOSCOPY WITH ENDOBRONCHIAL ULTRASOUND AND FLUROSCOPY;  Surgeon: [name], MD;  Location: [facility];  Service: Pulmonary;  Laterality: N/A;       Family History  Problem Relation Age of Onset  . Stomach cancer Mother   . COPD Father   . Lung cancer Brother   . Lung cancer Brother     Social History   Tobacco Use  . Smoking status: Former Smoker    Packs/day: 1.00    Years: 60.00    Pack years: 60.00    Types: Cigarettes  . Smokeless tobacco: Never Used  Vaping Use  . Vaping Use: Never used  Substance Use Topics  . Alcohol use: Yes    Comment: occasional  .  Drug use: No    Home Medications Prior to Admission medications   Medication Sig Start Date End Date Taking? Authorizing Provider  cephALEXin (KEFLEX) 500 MG capsule Take 1 capsule (500 mg total) by mouth 4 (four) times daily. 04/07/20   Antonietta Breach, PA-C  ferrous sulfate 325 (65 FE) MG tablet Take 1 tablet (325 mg total) by mouth daily with breakfast. Patient not taking: Reported on 03/01/2020 09/27/19   Regalado, Jerald Kief A, MD  Multiple Vitamin (MULTIVITAMIN WITH MINERALS) TABS tablet Take 1 tablet by mouth daily. Patient not  taking: Reported on 03/01/2020 02/21/20   Mercy Riding, MD  pantoprazole (PROTONIX) 40 MG tablet Take 1 tablet (40 mg total) by mouth daily. Patient not taking: Reported on 02/17/2020 09/28/19   Regalado, Jerald Kief A, MD  polyethylene glycol (MIRALAX / GLYCOLAX) 17 g packet Take 17 g by mouth 2 (two) times daily. Patient not taking: Reported on 02/17/2020 09/27/19   Regalado, Jerald Kief A, MD  STOOL SOFTENER/LAXATIVE 50-8.6 MG tablet TAKE 1 TABLET BY MOUTH TWICE DAILY Patient not taking: Reported on 03/01/2020 01/02/20   Curt Bears, MD  XARELTO 20 MG TABS tablet TAKE 1 TABLET BY MOUTH DAILY WITH SUPPER Patient not taking: Reported on 03/01/2020 02/29/20   Curt Bears, MD    Allergies    Patient has no known allergies.  Review of Systems   Review of Systems  Unable to perform ROS: Dementia  Genitourinary: Positive for hematuria.    Physical Exam Updated Vital Signs BP 120/66 (BP Location: Right Arm)   Pulse 82   Temp 97.8 F (36.6 C) (Rectal)   Resp 18   SpO2 97%   Physical Exam Vitals and nursing note reviewed.  Constitutional:      General: He is not in acute distress.    Appearance: He is well-developed. He is not diaphoretic.     Comments: Nontoxic appearing and in NAD  HENT:     Head: Normocephalic and atraumatic.  Eyes:     General: No scleral icterus.    Conjunctiva/sclera: Conjunctivae normal.  Cardiovascular:     Rate and Rhythm: Regular rhythm. Tachycardia present.     Pulses: Normal pulses.  Pulmonary:     Effort: Pulmonary effort is normal. No respiratory distress.     Comments: Respirations even and unlabored Abdominal:     General: There is no distension.     Palpations: Abdomen is soft.     Comments: Soft, nontender abdomen.  Genitourinary:    Comments: Uncircumcised male with evidence of hematuria in condom catheter. No evidence of trauma. Exam chaperoned by Caryl Asp, NT. Musculoskeletal:        General: Normal range of motion.     Cervical back: Normal range  of motion.  Skin:    General: Skin is warm and dry.     Coloration: Skin is not pale.     Findings: No erythema or rash.  Neurological:     Mental Status: He is alert.  Psychiatric:     Comments: Mildly agitated      ED Results / Procedures / Treatments   Labs (all labs ordered are listed, but only abnormal results are displayed) Labs Reviewed  URINALYSIS, ROUTINE W REFLEX MICROSCOPIC - Abnormal; Notable for the following components:      Result Value   Color, Urine AMBER (*)    APPearance TURBID (*)    Glucose, UA   (*)    Value: TEST NOT REPORTED DUE TO COLOR INTERFERENCE OF URINE PIGMENT  Hgb urine dipstick   (*)    Value: TEST NOT REPORTED DUE TO COLOR INTERFERENCE OF URINE PIGMENT   Bilirubin Urine   (*)    Value: TEST NOT REPORTED DUE TO COLOR INTERFERENCE OF URINE PIGMENT   Ketones, ur   (*)    Value: TEST NOT REPORTED DUE TO COLOR INTERFERENCE OF URINE PIGMENT   Protein, ur   (*)    Value: TEST NOT REPORTED DUE TO COLOR INTERFERENCE OF URINE PIGMENT   Nitrite   (*)    Value: TEST NOT REPORTED DUE TO COLOR INTERFERENCE OF URINE PIGMENT   Leukocytes,Ua   (*)    Value: TEST NOT REPORTED DUE TO COLOR INTERFERENCE OF URINE PIGMENT   RBC / HPF >50 (*)    Bacteria, UA MANY (*)    All other components within normal limits  LACTIC ACID, PLASMA - Abnormal; Notable for the following components:   Lactic Acid, Venous 2.5 (*)    All other components within normal limits  CBC WITH DIFFERENTIAL/PLATELET - Abnormal; Notable for the following components:   Hemoglobin 12.3 (*)    HCT 37.9 (*)    MCV 67.4 (*)    MCH 21.9 (*)    RDW 17.9 (*)    All other components within normal limits  BASIC METABOLIC PANEL - Abnormal; Notable for the following components:   Sodium 148 (*)    Chloride 113 (*)    Glucose, Bld 138 (*)    All other components within normal limits  LACTIC ACID, PLASMA - Abnormal; Notable for the following components:   Lactic Acid, Venous 2.1 (*)    All other  components within normal limits  URINE CULTURE    EKG None  Radiology No results found.  Procedures Procedures (including critical care time)  Medications Ordered in ED Medications  lactated ringers bolus 500 mL (0 mLs Intravenous Stopped 04/07/20 0101)  cefTRIAXone (ROCEPHIN) 1 g in sodium chloride 0.9 % 100 mL IVPB (0 g Intravenous Stopped 04/07/20 0130)  lactated ringers bolus 500 mL (0 mLs Intravenous Stopped 04/07/20 0130)    ED Course  I have reviewed the triage vital signs and the nursing notes.  Pertinent labs & imaging results that were available during my care of the patient were reviewed by me and considered in my medical decision making (see chart for details).  Clinical Course as of Apr 08 411  Fri Apr 06, 2020  2303 Spoke with daughter regarding symptoms. Reports onset today. Hx of UTIs and hematuria, last 1 month ago. Completed course of abx prescribed. No known fevers.   [KH]    Clinical Course User Index [KH] Beverely Pace   MDM Rules/Calculators/A&P                          79 year old male presenting from home for evaluation of hematuria.  Patient with findings consistent with hematuria secondary to acute cystitis.  Mildly hypothermic on arrival and tachycardic.  Tachycardia has improved with IV fluids.  Repeat temperature improved without intervention.  He has no leukocytosis or significant electrolyte derangements.  Kidney function preserved.  Trending lactate 2.1.  Feel the patient is stable for discharge and primary care follow-up for repeat evaluation.  Given IV Rocephin prior to discharge.  Prescription sent to pharmacy for Keflex.  Daughter aware and agreeable to plan.  Discharged from the department via [facility] in stable condition.  Vitals:   04/06/20 2345 04/07/20 0019 04/07/20 0115  04/07/20 0230  BP: 123/69  132/71 120/66  Pulse: (!) 103  98 82  Resp: 18  18 18   Temp:  97.8 F (36.6 C)    TempSrc:  Rectal    SpO2: 95%  99% 97%    Final  Clinical Impression(s) / ED Diagnoses Final diagnoses:  Acute cystitis with hematuria  SIRS (systemic inflammatory response syndrome) (HCC)    Rx / DC Orders ED Discharge Orders         Ordered    cephALEXin (KEFLEX) 500 MG capsule  4 times daily        04/07/20 0120           Antonietta Breach, PA-C 04/07/20 6962    Milton Ferguson, MD 04/17/20 1537

## 2020-04-06 NOTE — ED Triage Notes (Signed)
Pt came in from home with c/o hematuria. He came in via EMS. Pt has blood present around his penis. Pt is confused at baseline. Pt HR is currently 125-135. Urine is extremely malodorous

## 2020-04-07 DIAGNOSIS — R531 Weakness: Secondary | ICD-10-CM | POA: Diagnosis not present

## 2020-04-07 DIAGNOSIS — M2550 Pain in unspecified joint: Secondary | ICD-10-CM | POA: Diagnosis not present

## 2020-04-07 DIAGNOSIS — Z7401 Bed confinement status: Secondary | ICD-10-CM | POA: Diagnosis not present

## 2020-04-07 LAB — LACTIC ACID, PLASMA: Lactic Acid, Venous: 2.1 mmol/L (ref 0.5–1.9)

## 2020-04-07 MED ORDER — SODIUM CHLORIDE 0.9 % IV SOLN
1.0000 g | Freq: Once | INTRAVENOUS | Status: AC
  Administered 2020-04-07: 1 g via INTRAVENOUS
  Filled 2020-04-07: qty 10

## 2020-04-07 MED ORDER — CEPHALEXIN 500 MG PO CAPS: 500.0000 mg | ORAL_CAPSULE | Freq: Four times a day (QID) | ORAL | 0 refills | Status: DC

## 2020-04-07 MED ORDER — LACTATED RINGERS IV BOLUS
500.0000 mL | Freq: Once | INTRAVENOUS | Status: AC
  Administered 2020-04-07: 500 mL via INTRAVENOUS

## 2020-04-07 NOTE — ED Notes (Signed)
[facility] called for pt transport back home

## 2020-04-07 NOTE — Discharge Instructions (Signed)
You have been prescribed an antibiotic for urinary tract infection.  Take this as prescribed until finished.  We recommend follow-up with your primary care doctor on Monday or Tuesday to ensure that your infection is clearing.  Return to the emergency department for new or concerning symptoms.

## 2020-04-07 NOTE — ED Notes (Signed)
[facility] present for pt transport

## 2020-04-08 LAB — URINE CULTURE

## 2020-04-12 ENCOUNTER — Telehealth (INDEPENDENT_AMBULATORY_CARE_PROVIDER_SITE_OTHER): Payer: Self-pay | Admitting: Primary Care

## 2020-04-12 ENCOUNTER — Ambulatory Visit (INDEPENDENT_AMBULATORY_CARE_PROVIDER_SITE_OTHER): Payer: Self-pay

## 2020-04-12 NOTE — Telephone Encounter (Signed)
Open in error See triage note today

## 2020-04-12 NOTE — Telephone Encounter (Signed)
  Called patients daughter for her request for medication refills and she was concerned about her father.  She states that he went to ER last Sunday 10/24.  She states that they dx him with a UTI and sent him home with Cephalexin 500mg  4 x day.  She states that she was going to call the office because her is bleeding a lot from his penis.  She states that it turns the commode burgundy red . Per daughter he takes blood thinner.  He has C/O burning.  She states that he gets weak and she need to get him a wheelchair. Per protocol patient will go to ER for evaluation. Care advice given.  She verbalized understanding. She will call back for follow up visit with office after ER visit. Reason for Disposition . Patient sounds very sick or weak to the triager  Answer Assessment - Initial Assessment Questions 1. ANTIBIOTIC: "What antibiotic are you taking?" "How many times per day?"     Cephalexin 500 4xdaily 2. DURATION: "When was the antibiotic started?"     Sunday 24th 3. MAIN SYMPTOM: "What is the main symptom you are concerned about?"     bleeding 4. FEVER: "Do you have a fever?" If Yes, ask: "What is it, how was it measured, and when did it start?"     no 5. OTHER SYMPTOMS: "Do you have any other symptoms?" (e.g., flank pain, penile discharge, scrotal pain, blood in urine)     Penile bleeding, confusion, burning  Protocols used: URINARY TRACT INFECTION ON ANTIBIOTIC FOLLOW-UP CALL - MALE-A-AH

## 2020-04-12 NOTE — Telephone Encounter (Signed)
Sent to PCP ?

## 2020-04-18 ENCOUNTER — Other Ambulatory Visit: Payer: Self-pay | Admitting: Internal Medicine

## 2020-04-18 DIAGNOSIS — I2699 Other pulmonary embolism without acute cor pulmonale: Secondary | ICD-10-CM

## 2020-04-19 ENCOUNTER — Other Ambulatory Visit (INDEPENDENT_AMBULATORY_CARE_PROVIDER_SITE_OTHER): Payer: Self-pay | Admitting: Primary Care

## 2020-04-19 ENCOUNTER — Telehealth: Payer: Self-pay | Admitting: Primary Care

## 2020-04-19 DIAGNOSIS — F0280 Dementia in other diseases classified elsewhere without behavioral disturbance: Secondary | ICD-10-CM

## 2020-04-19 DIAGNOSIS — W19XXXA Unspecified fall, initial encounter: Secondary | ICD-10-CM

## 2020-04-19 DIAGNOSIS — Y92009 Unspecified place in unspecified non-institutional (private) residence as the place of occurrence of the external cause: Secondary | ICD-10-CM

## 2020-04-19 NOTE — Telephone Encounter (Signed)
Copied from [place] [id number]. Topic: General - Other >> Apr 19, 2020 10:14 AM Celene Kras wrote: Reason for CRM: [name], pts daughter, called and is requesting to have a prescription for a wheelchair for pt. She states that the one that was written before has been lost. Please advise.

## 2020-04-19 NOTE — Telephone Encounter (Signed)
Please place order for patient wheelchair.

## 2020-04-24 ENCOUNTER — Ambulatory Visit: Payer: Medicare HMO | Attending: Primary Care

## 2020-04-24 DIAGNOSIS — Z23 Encounter for immunization: Secondary | ICD-10-CM

## 2020-04-24 NOTE — Progress Notes (Signed)
   Covid-19 Vaccination Clinic  Name:  Obert Espindola    MRN: [id number] DOB: August 14, 1940  04/24/2020  Mr. Jeanbaptiste was observed post Covid-19 immunization for 15 minutes without incident. He was provided with Vaccine Information Sheet and instruction to access the V-Safe system.   Mr. Poer was instructed to call 911 with any severe reactions post vaccine: Marland Kitchen Difficulty breathing  . Swelling of face and throat  . A fast heartbeat  . A bad rash all over body  . Dizziness and weakness   Immunizations Administered    Name Date Dose VIS Date Route   Pfizer COVID-19 Vaccine 04/24/2020 10:45 AM 0.3 mL 04/04/2020 Intramuscular   Manufacturer: [employer]   Lot: [id number]   [place]: [id number]

## 2020-04-25 ENCOUNTER — Ambulatory Visit: Payer: Medicare HMO

## 2020-04-27 ENCOUNTER — Ambulatory Visit: Payer: Medicare HMO

## 2020-05-08 ENCOUNTER — Emergency Department (HOSPITAL_COMMUNITY): Payer: Medicare HMO

## 2020-05-08 ENCOUNTER — Inpatient Hospital Stay (HOSPITAL_COMMUNITY)
Admission: EM | Admit: 2020-05-08 | Discharge: 2020-05-19 | DRG: 871 | Disposition: A | Discharge status: Home Health | Payer: Medicare HMO | Attending: Internal Medicine | Admitting: Internal Medicine

## 2020-05-08 ENCOUNTER — Other Ambulatory Visit: Payer: Self-pay | Admitting: Internal Medicine

## 2020-05-08 DIAGNOSIS — R55 Syncope and collapse: Secondary | ICD-10-CM | POA: Diagnosis not present

## 2020-05-08 DIAGNOSIS — I2699 Other pulmonary embolism without acute cor pulmonale: Secondary | ICD-10-CM

## 2020-05-08 DIAGNOSIS — Z681 Body mass index (BMI) 19 or less, adult: Secondary | ICD-10-CM

## 2020-05-08 DIAGNOSIS — R29810 Facial weakness: Secondary | ICD-10-CM | POA: Diagnosis present

## 2020-05-08 DIAGNOSIS — I10 Essential (primary) hypertension: Secondary | ICD-10-CM | POA: Diagnosis not present

## 2020-05-08 DIAGNOSIS — G9389 Other specified disorders of brain: Secondary | ICD-10-CM | POA: Diagnosis present

## 2020-05-08 DIAGNOSIS — I959 Hypotension, unspecified: Secondary | ICD-10-CM

## 2020-05-08 DIAGNOSIS — E43 Unspecified severe protein-calorie malnutrition: Secondary | ICD-10-CM | POA: Diagnosis present

## 2020-05-08 DIAGNOSIS — N390 Urinary tract infection, site not specified: Secondary | ICD-10-CM | POA: Diagnosis present

## 2020-05-08 DIAGNOSIS — M25551 Pain in right hip: Secondary | ICD-10-CM | POA: Diagnosis not present

## 2020-05-08 DIAGNOSIS — B964 Proteus (mirabilis) (morganii) as the cause of diseases classified elsewhere: Secondary | ICD-10-CM | POA: Diagnosis present

## 2020-05-08 DIAGNOSIS — A4159 Other Gram-negative sepsis: Principal | ICD-10-CM | POA: Diagnosis present

## 2020-05-08 DIAGNOSIS — F0390 Unspecified dementia without behavioral disturbance: Secondary | ICD-10-CM | POA: Diagnosis present

## 2020-05-08 DIAGNOSIS — Z87891 Personal history of nicotine dependence: Secondary | ICD-10-CM

## 2020-05-08 DIAGNOSIS — I480 Paroxysmal atrial fibrillation: Secondary | ICD-10-CM | POA: Diagnosis present

## 2020-05-08 DIAGNOSIS — H518 Other specified disorders of binocular movement: Secondary | ICD-10-CM | POA: Diagnosis present

## 2020-05-08 DIAGNOSIS — J90 Pleural effusion, not elsewhere classified: Secondary | ICD-10-CM | POA: Diagnosis not present

## 2020-05-08 DIAGNOSIS — E785 Hyperlipidemia, unspecified: Secondary | ICD-10-CM | POA: Diagnosis present

## 2020-05-08 DIAGNOSIS — R42 Dizziness and giddiness: Secondary | ICD-10-CM | POA: Diagnosis present

## 2020-05-08 DIAGNOSIS — R680 Hypothermia, not associated with low environmental temperature: Secondary | ICD-10-CM | POA: Diagnosis present

## 2020-05-08 DIAGNOSIS — G9340 Encephalopathy, unspecified: Secondary | ICD-10-CM | POA: Diagnosis present

## 2020-05-08 DIAGNOSIS — Z8673 Personal history of transient ischemic attack (TIA), and cerebral infarction without residual deficits: Secondary | ICD-10-CM

## 2020-05-08 DIAGNOSIS — Z20822 Contact with and (suspected) exposure to covid-19: Secondary | ICD-10-CM | POA: Diagnosis present

## 2020-05-08 DIAGNOSIS — A419 Sepsis, unspecified organism: Secondary | ICD-10-CM | POA: Diagnosis not present

## 2020-05-08 DIAGNOSIS — Z8616 Personal history of COVID-19: Secondary | ICD-10-CM

## 2020-05-08 DIAGNOSIS — Z801 Family history of malignant neoplasm of trachea, bronchus and lung: Secondary | ICD-10-CM

## 2020-05-08 DIAGNOSIS — R109 Unspecified abdominal pain: Secondary | ICD-10-CM | POA: Diagnosis not present

## 2020-05-08 DIAGNOSIS — T68XXXA Hypothermia, initial encounter: Secondary | ICD-10-CM

## 2020-05-08 DIAGNOSIS — Z800 Family history of malignant neoplasm of digestive organs: Secondary | ICD-10-CM

## 2020-05-08 DIAGNOSIS — Z79899 Other long term (current) drug therapy: Secondary | ICD-10-CM

## 2020-05-08 DIAGNOSIS — J439 Emphysema, unspecified: Secondary | ICD-10-CM | POA: Diagnosis not present

## 2020-05-08 DIAGNOSIS — R404 Transient alteration of awareness: Secondary | ICD-10-CM

## 2020-05-08 DIAGNOSIS — E7800 Pure hypercholesterolemia, unspecified: Secondary | ICD-10-CM | POA: Diagnosis present

## 2020-05-08 DIAGNOSIS — R6520 Severe sepsis without septic shock: Secondary | ICD-10-CM | POA: Diagnosis present

## 2020-05-08 DIAGNOSIS — C3490 Malignant neoplasm of unspecified part of unspecified bronchus or lung: Secondary | ICD-10-CM | POA: Diagnosis not present

## 2020-05-08 DIAGNOSIS — R471 Dysarthria and anarthria: Secondary | ICD-10-CM | POA: Diagnosis present

## 2020-05-08 DIAGNOSIS — Z825 Family history of asthma and other chronic lower respiratory diseases: Secondary | ICD-10-CM

## 2020-05-08 DIAGNOSIS — Z7901 Long term (current) use of anticoagulants: Secondary | ICD-10-CM

## 2020-05-08 DIAGNOSIS — Z86711 Personal history of pulmonary embolism: Secondary | ICD-10-CM

## 2020-05-08 DIAGNOSIS — R531 Weakness: Secondary | ICD-10-CM

## 2020-05-08 DIAGNOSIS — C3491 Malignant neoplasm of unspecified part of right bronchus or lung: Secondary | ICD-10-CM | POA: Diagnosis present

## 2020-05-08 DIAGNOSIS — J9601 Acute respiratory failure with hypoxia: Secondary | ICD-10-CM | POA: Diagnosis present

## 2020-05-08 LAB — CBC
HCT: 43.9 % (ref 39.0–52.0)
Hemoglobin: 13.7 g/dL (ref 13.0–17.0)
MCH: 20.7 pg — ABNORMAL LOW (ref 26.0–34.0)
MCHC: 31.2 g/dL (ref 30.0–36.0)
MCV: 66.2 fL — ABNORMAL LOW (ref 80.0–100.0)
Platelets: 409 10*3/uL — ABNORMAL HIGH (ref 150–400)
RBC: 6.63 MIL/uL — ABNORMAL HIGH (ref 4.22–5.81)
RDW: 18.2 % — ABNORMAL HIGH (ref 11.5–15.5)
WBC: 8.7 10*3/uL (ref 4.0–10.5)
nRBC: 0.2 % (ref 0.0–0.2)

## 2020-05-08 LAB — COMPREHENSIVE METABOLIC PANEL
ALT: 30 U/L (ref 0–44)
AST: 37 U/L (ref 15–41)
Albumin: 4 g/dL (ref 3.5–5.0)
Alkaline Phosphatase: 103 U/L (ref 38–126)
Anion gap: 14 (ref 5–15)
BUN: 18 mg/dL (ref 8–23)
CO2: 22 mmol/L (ref 22–32)
Calcium: 10.5 mg/dL — ABNORMAL HIGH (ref 8.9–10.3)
Chloride: 106 mmol/L (ref 98–111)
Creatinine, Ser: 1.19 mg/dL (ref 0.61–1.24)
GFR, Estimated: >60 mL/min (ref >60)
Glucose, Bld: 159 mg/dL — ABNORMAL HIGH (ref 70–99)
Potassium: 3.9 mmol/L (ref 3.5–5.1)
Sodium: 142 mmol/L (ref 135–145)
Total Bilirubin: 0.6 mg/dL (ref 0.3–1.2)
Total Protein: 7.8 g/dL (ref 6.5–8.1)

## 2020-05-08 LAB — I-STAT CHEM 8, ED
BUN: 27 mg/dL — ABNORMAL HIGH (ref 8–23)
Calcium, Ion: 1.14 mmol/L — ABNORMAL LOW (ref 1.15–1.40)
Chloride: 111 mmol/L (ref 98–111)
Creatinine, Ser: 0.9 mg/dL (ref 0.61–1.24)
Glucose, Bld: 150 mg/dL — ABNORMAL HIGH (ref 70–99)
HCT: 43 % (ref 39.0–52.0)
Hemoglobin: 14.6 g/dL (ref 13.0–17.0)
Potassium: 4.3 mmol/L (ref 3.5–5.1)
Sodium: 143 mmol/L (ref 135–145)
TCO2: 21 mmol/L — ABNORMAL LOW (ref 22–32)

## 2020-05-08 LAB — CBG MONITORING, ED: Glucose-Capillary: 95 mg/dL (ref 70–99)

## 2020-05-08 LAB — PROTIME-INR
INR: 1.2 (ref 0.8–1.2)
Prothrombin Time: 14.9 seconds (ref 11.4–15.2)

## 2020-05-08 LAB — APTT: aPTT: 31 seconds (ref 24–36)

## 2020-05-08 MED ORDER — IOHEXOL 300 MG/ML  SOLN
100.0000 mL | Freq: Once | INTRAMUSCULAR | Status: AC | PRN
  Administered 2020-05-08: 100 mL via INTRAVENOUS

## 2020-05-08 MED ORDER — PIPERACILLIN-TAZOBACTAM 3.375 G IVPB
3.3750 g | Freq: Two times a day (BID) | INTRAVENOUS | Status: DC
  Administered 2020-05-09: 3.375 g via INTRAVENOUS
  Filled 2020-05-08: qty 50

## 2020-05-08 MED ORDER — VANCOMYCIN HCL IN DEXTROSE 1-5 GM/200ML-% IV SOLN: 1000.0000 mg | Freq: Once | INTRAVENOUS | Status: DC

## 2020-05-08 MED ORDER — SODIUM CHLORIDE 0.9% FLUSH
3.0000 mL | Freq: Once | INTRAVENOUS | Status: AC
  Administered 2020-05-15: 3 mL via INTRAVENOUS

## 2020-05-08 MED ORDER — LACTATED RINGERS IV BOLUS (SEPSIS)
1500.0000 mL | Freq: Once | INTRAVENOUS | Status: AC
  Administered 2020-05-09: 1500 mL via INTRAVENOUS

## 2020-05-08 MED ORDER — LACTATED RINGERS IV SOLN: INTRAVENOUS | Status: DC

## 2020-05-08 MED ORDER — VANCOMYCIN HCL 1500 MG/300ML IV SOLN
1500.0000 mg | Freq: Once | INTRAVENOUS | Status: AC
  Administered 2020-05-09: 1500 mg via INTRAVENOUS
  Filled 2020-05-08: qty 300

## 2020-05-08 NOTE — ED Triage Notes (Signed)
Pt arrives to ED BIB [facility] as a Code Stroke. Per EMS pt LKW was at 1700 while having dinner with family when pt unresponsive. Per EMS UPON their arrival pt had Rt sided gaze, Slurred Speech and Hypotensive. Hx of blood clots and cancer. Pt is on xarelto. Pt a/o to self. [facility]'s [facility].   BP 80/40 R 18 O2 98% RA

## 2020-05-08 NOTE — Consult Note (Signed)
Neurology Consultation Reason for Consult: Syncope Referring Physician: Billy Fischer, E  CC: Altered consciousness  History is obtained from: EMS  HPI: Richard [name] is a 79 y.o. male who was in his normal state of health earlier when he was seen to become suddenly unresponsive.  Patient states that he felt severely lightheaded prior to passing out.  He states then "I just went out." I was unable to get a hold of the caregiver that was with him, as his daughter was admitted to the hospital at the current time.    LKW: unclear tpa given?: no, on xarelto.   ROS: A 14 point ROS was performed and is negative except as noted in the HPI.   Past Medical History:  Diagnosis Date  . Acute pulmonary embolism ([place]) 01/30/2019  . Acute respiratory failure with hypoxia ([place]) 01/30/2019  . AKI (acute kidney injury) ([place]) 01/30/2019  . Atrial fibrillation with RVR ([place])   . Dementia ([place])   . High cholesterol   . Hypertension   . Lung mass 12/2018  . Severe sepsis ([place]) 01/30/2019     Family History  Problem Relation Age of Onset  . Stomach cancer Mother   . COPD Father   . Lung cancer Brother   . Lung cancer Brother      Social History:  reports that he has quit smoking. His smoking use included cigarettes. He has a 60.00 pack-year smoking history. He has never used smokeless tobacco. He reports current alcohol use. He reports that he does not use drugs.   Exam: Current vital signs: BP 100/78 (BP Location: Left Arm)   Pulse 100   Resp 19  Vital signs in last 24 hours: Pulse Rate:  [100] 100 (11/23 2150) Resp:  [19] 19 (11/23 2150) BP: (100)/(78) 100/78 (11/23 2150)   Physical Exam  Constitutional: Appears well-developed and well-nourished.  Psych: Affect appropriate to situation Eyes: No scleral injection HENT: No OP obstrucion MSK: no joint deformities.  Cardiovascular: Normal rate and regular rhythm.  Respiratory: Effort normal, non-labored breathing GI: Soft.  No  distension. There is no tenderness.  Skin: WDI  Neuro: Mental Status: Patient is awake, alert, gives month as March, age as 79 Patient is able to give a clear and coherent history. No signs of aphasia or neglect Cranial Nerves: II: L hemianopia Pupils are equal, round, and reactive to light.   III,IV, VI: EOMI without ptosis or diploplia.  V: Facial sensation is symmetric to temperature VII: Facial movement is symmetric.  VIII: hearing is intact to voice X: Uvula elevates symmetrically XI: Shoulder shrug is symmetric. XII: tongue is midline without atrophy or fasciculations.  Motor: Tone is normal. Bulk is normal. 5/5 strength was present in all four extremities.  Sensory: He endorses symmetric sensation, but does extinguish to DSS in the arm Cerebellar: FNF intact bilaterally    I have reviewed labs in epic and the results pertinent to this consultation are: Cr 0.9  I have reviewed the images obtained:  CT head- severe atrophy with ventriculomegaly, previous R occipital stroke.   Impression: 79 yo with syncope vs seizure. His decreased responsiveness was coupled with low BP, and his report of lightheadedness could support syncope as well. With patients with dementia, spell duration is not always as reliable. Certainly, with his previous stroke, this could explain focal deficits in the setting of hypoperfusion, but could also support a todd's phenomenon. Given the global nature, I think that stroke/tia much less likely.   Recommendations: 1) MR  brain/MRA head and neck 2) EEG 3) could continue xarelto  4) Neurology to follow.    Roland Rack, MD [facility] [phone]  If 7pm- 7am, please page neurology on call as listed in [facility].

## 2020-05-08 NOTE — Progress Notes (Signed)
Due to patient status, waiting to do MRI.

## 2020-05-08 NOTE — Code Documentation (Signed)
Responded to Code Stroke called at 2128 for R facial droop and R gaze, [id number]. Pt arrived at 2138, NIH-6, CT head negative for acute changes. TPA not given-pt on xarelto and outside window. Plan for MRI.

## 2020-05-08 NOTE — ED Notes (Signed)
Patient transported to CT 

## 2020-05-08 NOTE — ED Provider Notes (Signed)
[facility] EMERGENCY DEPARTMENT Provider Note   CSN: [id number] Arrival date & time: 05/08/20  2138  An emergency department physician performed an initial assessment on this suspected stroke patient at 2139.  History Chief Complaint  Patient presents with  . Code Stroke    Richard [name] is a 79 y.o. male.  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 Earlie [name], paroxysmal atrial fibrillation and pulmonary embolus on Xarelto, dementia, hypertension, admission for COVID-19 September presents as a code stroke with concern for episode of with right gaze deviation found to have hypotension and hypothermia.  Unclear by history last known well.  Patient reports that he does remember passing out earlier today and feeling lightheaded prior to this happening.  His daughter was currently admitted to the hospital however checked out Richard [name] and is at patient's bedside at this time.  He reportedly had blood pressures in the 80s with EMS and after receiving some fluids his blood pressures improved.  They had noted some dysarthria and right gaze deviation.   He reports some abdominal pain on my exam. Denies chest pain or dyspnea. Reports he has pain in his right leg.  Denies cough, congestion. History limited by dementia.  He is continuing to talk about his "twin" who is in the room with [place] right now.    Past Medical History:  Diagnosis Date  . Acute pulmonary embolism ([place]) 01/30/2019  . Acute respiratory failure with hypoxia ([place]) 01/30/2019  . AKI (acute kidney injury) ([place]) 01/30/2019  . Atrial fibrillation with RVR ([place])   . Dementia ([place])   . High cholesterol   . Hypertension   . Lung mass 12/2018  . Severe sepsis ([place]) 01/30/2019    Patient Active Problem List   Diagnosis Date Noted  . Acute respiratory disease due to COVID-19 virus 02/17/2020  . Acute respiratory failure due to  COVID-19 ([place]) 02/17/2020  . Syncope 11/04/2019  . Fall at home, initial encounter 11/04/2019  . Anemia 09/25/2019  . Symptomatic anemia 09/24/2019  . Paroxysmal atrial fibrillation ([place]) 07/06/2019  . Pressure injury of skin 05/21/2019  . Anorexia 05/20/2019  . HCAP (healthcare-associated pneumonia) 05/18/2019  . Protein-calorie malnutrition, severe ([place]) 05/18/2019  . Sepsis ([place]) 05/08/2019  . History of pulmonary embolism 03/30/2019  . Pure hypercholesterolemia 03/30/2019  . SOB (shortness of breath) 03/30/2019  . Palliative care by specialist   . DNR (do not resuscitate) discussion   . Poor appetite 01/05/2019  . Goals of care, counseling/discussion 12/28/2018  . Encounter for antineoplastic chemotherapy 12/28/2018  . Stage III squamous cell carcinoma of right lung ([place]) 12/28/2018  . Dementia without behavioral disturbance ([place]) 12/16/2018  . Lung mass 12/15/2018  . Pelvic mass in male 12/15/2018  . Postobstructive pneumonia 12/15/2018  . Hypertension     Past Surgical History:  Procedure Laterality Date  . IR GASTROSTOMY TUBE MOD SED  05/25/2019  . IR GASTROSTOMY TUBE REMOVAL  09/08/2019  . JOINT REPLACEMENT    . VIDEO BRONCHOSCOPY WITH ENDOBRONCHIAL ULTRASOUND N/A 12/16/2018   Procedure: VIDEO BRONCHOSCOPY WITH ENDOBRONCHIAL ULTRASOUND AND FLUROSCOPY;  Surgeon: Richard Garfinkel, MD;  Location: [facility];  Service: Pulmonary;  Laterality: N/A;       Family History  Problem Relation Age of Onset  . Stomach cancer Mother   . COPD Father   . Lung cancer Brother   . Lung cancer Brother     Social  History   Tobacco Use  . Smoking status: Former Smoker    Packs/day: 1.00    Years: 60.00    Pack years: 60.00    Types: Cigarettes  . Smokeless tobacco: Never Used  Vaping Use  . Vaping Use: Never used  Substance Use Topics  . Alcohol use: Yes    Comment: occasional  . Drug use: No    Home Medications Prior to Admission medications   Medication Sig Start Date End  Date Taking? Authorizing Provider  cephALEXin (KEFLEX) 500 MG capsule Take 1 capsule (500 mg total) by mouth 4 (four) times daily. 04/07/20   Antonietta Breach, PA-C  ferrous sulfate 325 (65 FE) MG tablet Take 1 tablet (325 mg total) by mouth daily with breakfast. Patient not taking: Reported on 03/01/2020 09/27/19   Regalado, Jerald Kief A, MD  Multiple Vitamin (MULTIVITAMIN WITH MINERALS) TABS tablet Take 1 tablet by mouth daily. Patient not taking: Reported on 03/01/2020 02/21/20   Mercy Riding, MD  pantoprazole (PROTONIX) 40 MG tablet Take 1 tablet (40 mg total) by mouth daily. Patient not taking: Reported on 02/17/2020 09/28/19   Regalado, Jerald Kief A, MD  polyethylene glycol (MIRALAX / GLYCOLAX) 17 g packet Take 17 g by mouth 2 (two) times daily. Patient not taking: Reported on 02/17/2020 09/27/19   Regalado, Jerald Kief A, MD  STOOL SOFTENER/LAXATIVE 50-8.6 MG tablet TAKE 1 TABLET BY MOUTH TWICE DAILY Patient not taking: Reported on 03/01/2020 01/02/20   Curt Bears, MD  XARELTO 20 MG TABS tablet TAKE 1 TABLET BY MOUTH ONCE DAILY WITH  SUPPER. 04/18/20   Curt Bears, MD    Allergies    Patient has no known allergies.  Review of Systems   Review of Systems  Constitutional: Negative for fever.  Respiratory: Negative for cough and shortness of breath.   Cardiovascular: Negative for chest pain.  Gastrointestinal: Positive for abdominal pain.  Musculoskeletal: Positive for arthralgias.  Neurological: Positive for syncope and light-headedness.    Physical Exam Updated Vital Signs BP 111/61   Pulse (!) 111   Temp (!) 94.3 F (34.6 C) (Rectal)   Resp 13   Ht 6\' 3"  (1.905 m)   Wt 72.6 kg   SpO2 95%   BMI 20.01 kg/m   Physical Exam Vitals and nursing note reviewed.  Constitutional:      General: He is not in acute distress.    Appearance: He is well-developed. He is not diaphoretic.  HENT:     Head: Normocephalic and atraumatic.  Eyes:     Conjunctiva/sclera: Conjunctivae normal.   Cardiovascular:     Rate and Rhythm: Normal rate and regular rhythm.     Heart sounds: Normal heart sounds. No murmur heard.  No friction rub. No gallop.      Comments: Difficult to palpate distal DP/PT pulses bilaterally, strong PT doppler signals present Pulmonary:     Effort: Pulmonary effort is normal. No respiratory distress.     Breath sounds: Normal breath sounds. No wheezing or rales.  Abdominal:     General: There is no distension.     Palpations: Abdomen is soft.     Tenderness: There is abdominal tenderness (suprapubic, RLQ). There is no guarding.  Musculoskeletal:     Cervical back: Normal range of motion.  Skin:    General: Skin is warm and dry.  Neurological:     Mental Status: He is alert.     Comments: States it is 1960s,not aware of date Does report in hospital, name  ED Results / Procedures / Treatments   Labs (all labs ordered are listed, but only abnormal results are displayed) Labs Reviewed  CBC - Abnormal; Notable for the following components:      Result Value   RBC 6.63 (*)    MCV 66.2 (*)    MCH 20.7 (*)    RDW 18.2 (*)    Platelets 409 (*)    All other components within normal limits  COMPREHENSIVE METABOLIC PANEL - Abnormal; Notable for the following components:   Glucose, Bld 159 (*)    Calcium 10.5 (*)    All other components within normal limits  LACTIC ACID, PLASMA - Abnormal; Notable for the following components:   Lactic Acid, Venous 4.0 (*)    All other components within normal limits  I-STAT CHEM 8, ED - Abnormal; Notable for the following components:   BUN 27 (*)    Glucose, Bld 150 (*)    Calcium, Ion 1.14 (*)    TCO2 21 (*)    All other components within normal limits  CULTURE, BLOOD (ROUTINE X 2)  CULTURE, BLOOD (ROUTINE X 2)  URINE CULTURE  RESPIRATORY PANEL BY RT PCR (FLU A&B, COVID)  PROTIME-INR  APTT  DIFFERENTIAL  LACTIC ACID, PLASMA  URINALYSIS, ROUTINE W REFLEX MICROSCOPIC  TSH  T4, FREE  CBG MONITORING, ED     EKG EKG Interpretation  Date/Time:  Tuesday May 08 2020 22:47:25 EST Ventricular Rate:  110 PR Interval:    QRS Duration: 82 QT Interval:  302 QTC Calculation: 409 R Axis:   64 Text Interpretation: Sinus tachycardia Nonspecific T abnormalities, inferior leads Confirmed by Thayer Jew [phone]) on 05/09/2020 12:46:59 AM   Radiology CT ABDOMEN PELVIS W CONTRAST  Result Date: 05/09/2020 CLINICAL DATA:  Acute abdominal pain. Patient was unresponsive at dinner but now all ir to self. History of blood clots and cancer. EXAM: CT ABDOMEN AND PELVIS WITH CONTRAST TECHNIQUE: Multidetector CT imaging of the abdomen and pelvis was performed using the standard protocol following bolus administration of intravenous contrast. CONTRAST:  13mL OMNIPAQUE IOHEXOL 300 MG/ML  SOLN COMPARISON:  01/31/2020 FINDINGS: Lower chest: Small right pleural effusion with patchy infiltration or scarring in the right lung base, unchanged since prior study. 6 mm nodule in the left lung base is unchanged since prior study. Volume loss in the right lung. Hepatobiliary: No focal liver abnormality is seen. No gallstones, gallbladder wall thickening, or biliary dilatation. Pancreas: Unremarkable. No pancreatic ductal dilatation or surrounding inflammatory changes. Spleen: Normal in size without focal abnormality. Adrenals/Urinary Tract: Adrenal glands are unremarkable. Kidneys are normal, without renal calculi, focal lesion, or hydronephrosis. Bladder wall is diffusely thickened, possibly due to chronic outlet obstruction or cystitis. Layering debris in the dependent bladder may represent evidence of hemorrhage or infection. Stomach/Bowel: Stomach is fluid-filled without abnormal distention or wall thickening. Small bowel are decompressed. Stool fills the colon with particular prominence of rectal stool. No wall thickening or inflammatory changes are demonstrated. Appendix is normal. Vascular/Lymphatic: Aortic  atherosclerosis. No enlarged abdominal or pelvic lymph nodes. Reproductive: Lobular enlargement of the prostate gland, impressing on the bladder base. Other: No free air or free fluid in the abdomen. Abdominal wall musculature appears intact. Musculoskeletal: Postoperative left hip arthroplasty. Degenerative changes in the spine. No destructive bone lesions. IMPRESSION: 1. No acute process demonstrated in the abdomen or pelvis. 2. Small right pleural effusion with patchy infiltration or scarring in the right lung base, unchanged since prior study. 6 mm nodule in the left  lung base is unchanged since prior study. 3. Lobular enlargement of the prostate gland, impressing on the bladder base. 4. Bladder wall is diffusely thickened, possibly due to chronic outlet obstruction or cystitis. Layering debris in the dependent bladder may represent evidence of hemorrhage or infection. Correlation with urinalysis recommended. 5. Stool fills the colon with particular prominence of rectal stool. No evidence of bowel obstruction. 6. Aortic atherosclerosis. Aortic Atherosclerosis (ICD10-I70.0). Electronically Signed   By: Lucienne Capers M.D.   On: 05/09/2020 00:07   DG Chest Port 1 View  Result Date: 05/09/2020 CLINICAL DATA:  Sepsis. EXAM: PORTABLE CHEST 1 VIEW COMPARISON:  02/16/2020 FINDINGS: There are airspace opacities in the right lower lung zone and right mid lung zone. No pneumothorax. There is elevation of the right hemidiaphragm. Emphysematous changes are noted. There is right-sided volume loss. There is no acute displaced fracture. There is a small right-sided pleural effusion. The heart size appears unremarkable. IMPRESSION: 1. Small right-sided pleural effusion. 2. Again noted are post treatment changes in the right lung zone likely related to the patient's prior history of lung cancer. Electronically Signed   By: Constance Holster M.D.   On: 05/09/2020 01:07   DG Hip Unilat W or Wo Pelvis 2-3 Views  Right  Result Date: 05/09/2020 CLINICAL DATA:  Pain EXAM: DG HIP (WITH OR WITHOUT PELVIS) 2-3V RIGHT COMPARISON:  CT from same day FINDINGS: There are advanced degenerative changes of the right hip without evidence for an acute displaced fracture or dislocation. The patient is status post total hip arthroplasty on the left. IMPRESSION: Advanced degenerative changes of the right hip without evidence for an acute displaced fracture or dislocation. Electronically Signed   By: Constance Holster M.D.   On: 05/09/2020 01:10   CT HEAD CODE STROKE WO CONTRAST  Result Date: 05/08/2020 CLINICAL DATA:  Code stroke.  Right facial droop EXAM: CT HEAD WITHOUT CONTRAST TECHNIQUE: Contiguous axial images were obtained from the base of the skull through the vertex without intravenous contrast. COMPARISON:  11/04/2019 FINDINGS: Brain: There is no mass, hemorrhage or extra-axial collection. Severe ventriculomegaly, unchanged. There is hypoattenuation of the periventricular white matter, most commonly indicating chronic ischemic microangiopathy. Old right occipital infarct. Vascular: No abnormal hyperdensity of the major intracranial arteries or dural venous sinuses. No intracranial atherosclerosis. Skull: The visualized skull base, calvarium and extracranial soft tissues are normal. Sinuses/Orbits: No fluid levels or advanced mucosal thickening of the visualized paranasal sinuses. No mastoid or middle ear effusion. The orbits are normal. ASPECTS [facility] Stroke Program Early CT Score) - Ganglionic level infarction (caudate, lentiform nuclei, internal capsule, insula, M1-M3 cortex): 7 - Supraganglionic infarction (M4-M6 cortex): 3 Total score (0-10 with 10 being normal): 10 IMPRESSION: 1. No acute intracranial abnormality. 2. ASPECTS is 10. 3. 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 These results were communicated to Dr. Roland Rack at 9:57 pm on 05/08/2020 by text page via the [facility]  messaging system. Electronically Signed   By: Ulyses Jarred M.D.   On: 05/08/2020 21:57    Procedures .Critical Care Performed by: Gareth Morgan, MD Authorized by: Gareth Morgan, MD   Critical care provider statement:    Critical care time (minutes):  30   Critical care was time spent personally by me on the following activities:  Discussions with consultants, evaluation of patient's response to treatment, examination of patient, ordering and performing treatments and interventions, ordering and review of laboratory studies, ordering and review of radiographic studies, pulse oximetry, re-evaluation of patient's condition, obtaining  history from patient or surrogate and review of old charts   (including critical care time)  Medications Ordered in ED Medications  sodium chloride flush (NS) 0.9 % injection 3 mL (has no administration in time range)  lactated ringers infusion (has no administration in time range)  piperacillin-tazobactam (ZOSYN) IVPB 3.375 g (3.375 g Intravenous New Bag/Given 05/09/20 0033)  vancomycin (VANCOREADY) IVPB 1500 mg/300 mL (has no administration in time range)  lactated ringers bolus 1,000 mL (has no administration in time range)  lactated ringers bolus 1,500 mL (1,500 mLs Intravenous New Bag/Given 05/09/20 0037)  iohexol (OMNIPAQUE) 300 MG/ML solution 100 mL (100 mLs Intravenous Contrast Given 05/08/20 2349)    ED Course  I have reviewed the triage vital signs and the nursing notes.  Pertinent labs & imaging results that were available during my care of the patient were reviewed by me and considered in my medical decision making (see chart for details).    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 Earlie [name], paroxysmal atrial fibrillation and pulmonary embolus on Xarelto, dementia, hypertension, admission for COVID-19 September presents as  a code stroke with concern for episode of with right gaze deviation found to have hypotension and hypothermia.  Had blood pressure 80s with gaze deviation and dysarthria that improved with fluids on arrival to the ED, taken to CT as CODE STROKE and Dr. Leonel Ramsay was at bedside to evaluate. Presentation more consistent with syncope versus stroke. When patient returned to the room, BP 94W systolic and hypothermic to 94.  Given hyopthermia, tachycardia and hypotension, suspect infection/sepsis as etiology of hypotension and syncope.  Given tachycardia, doubt myxedema coma. No sign of acute GI bleed/PE/AAA. Did have abdominal tenderness and CT done to evaluate shows bladder thickened.    Given 30cc/kg fluids, empiric abx, cultures ordered and pending. UA pending.  Anticipate admission for continued syncope/vs seizure/MRA per neurology recommendations and treatmetn for suspected sepsis with source currently unknown and urine pending.    Final Clinical Impression(s) / ED Diagnoses Final diagnoses:  Syncope, unspecified syncope type  Generalized weakness  Hypothermia, initial encounter  Sepsis, due to unspecified organism, unspecified whether acute organ dysfunction present ([place])  Hypotension, unspecified hypotension type    Rx / DC Orders ED Discharge Orders    None       Gareth Morgan, MD 05/09/20 [phone]

## 2020-05-09 ENCOUNTER — Inpatient Hospital Stay (HOSPITAL_COMMUNITY): Payer: Medicare HMO

## 2020-05-09 ENCOUNTER — Emergency Department (HOSPITAL_COMMUNITY): Payer: Medicare HMO

## 2020-05-09 DIAGNOSIS — R29818 Other symptoms and signs involving the nervous system: Secondary | ICD-10-CM | POA: Diagnosis not present

## 2020-05-09 DIAGNOSIS — R42 Dizziness and giddiness: Secondary | ICD-10-CM | POA: Diagnosis present

## 2020-05-09 DIAGNOSIS — R5381 Other malaise: Secondary | ICD-10-CM | POA: Diagnosis not present

## 2020-05-09 DIAGNOSIS — E43 Unspecified severe protein-calorie malnutrition: Secondary | ICD-10-CM | POA: Diagnosis not present

## 2020-05-09 DIAGNOSIS — H518 Other specified disorders of binocular movement: Secondary | ICD-10-CM | POA: Diagnosis present

## 2020-05-09 DIAGNOSIS — M2550 Pain in unspecified joint: Secondary | ICD-10-CM | POA: Diagnosis not present

## 2020-05-09 DIAGNOSIS — Z86711 Personal history of pulmonary embolism: Secondary | ICD-10-CM | POA: Diagnosis not present

## 2020-05-09 DIAGNOSIS — I10 Essential (primary) hypertension: Secondary | ICD-10-CM | POA: Diagnosis present

## 2020-05-09 DIAGNOSIS — A419 Sepsis, unspecified organism: Secondary | ICD-10-CM | POA: Diagnosis not present

## 2020-05-09 DIAGNOSIS — E785 Hyperlipidemia, unspecified: Secondary | ICD-10-CM | POA: Diagnosis present

## 2020-05-09 DIAGNOSIS — T68XXXA Hypothermia, initial encounter: Secondary | ICD-10-CM

## 2020-05-09 DIAGNOSIS — J90 Pleural effusion, not elsewhere classified: Secondary | ICD-10-CM | POA: Diagnosis not present

## 2020-05-09 DIAGNOSIS — R6520 Severe sepsis without septic shock: Secondary | ICD-10-CM

## 2020-05-09 DIAGNOSIS — A4159 Other Gram-negative sepsis: Secondary | ICD-10-CM | POA: Diagnosis not present

## 2020-05-09 DIAGNOSIS — M25551 Pain in right hip: Secondary | ICD-10-CM | POA: Diagnosis not present

## 2020-05-09 DIAGNOSIS — N390 Urinary tract infection, site not specified: Secondary | ICD-10-CM | POA: Diagnosis present

## 2020-05-09 DIAGNOSIS — R29810 Facial weakness: Secondary | ICD-10-CM | POA: Diagnosis not present

## 2020-05-09 DIAGNOSIS — Z20822 Contact with and (suspected) exposure to covid-19: Secondary | ICD-10-CM | POA: Diagnosis not present

## 2020-05-09 DIAGNOSIS — F0390 Unspecified dementia without behavioral disturbance: Secondary | ICD-10-CM | POA: Diagnosis present

## 2020-05-09 DIAGNOSIS — G9389 Other specified disorders of brain: Secondary | ICD-10-CM | POA: Diagnosis not present

## 2020-05-09 DIAGNOSIS — R404 Transient alteration of awareness: Secondary | ICD-10-CM

## 2020-05-09 DIAGNOSIS — G319 Degenerative disease of nervous system, unspecified: Secondary | ICD-10-CM | POA: Diagnosis not present

## 2020-05-09 DIAGNOSIS — J439 Emphysema, unspecified: Secondary | ICD-10-CM | POA: Diagnosis not present

## 2020-05-09 DIAGNOSIS — C3490 Malignant neoplasm of unspecified part of unspecified bronchus or lung: Secondary | ICD-10-CM | POA: Diagnosis not present

## 2020-05-09 DIAGNOSIS — Z681 Body mass index (BMI) 19 or less, adult: Secondary | ICD-10-CM | POA: Diagnosis not present

## 2020-05-09 DIAGNOSIS — R531 Weakness: Secondary | ICD-10-CM | POA: Diagnosis not present

## 2020-05-09 DIAGNOSIS — J9601 Acute respiratory failure with hypoxia: Secondary | ICD-10-CM | POA: Diagnosis not present

## 2020-05-09 DIAGNOSIS — R55 Syncope and collapse: Secondary | ICD-10-CM | POA: Diagnosis not present

## 2020-05-09 DIAGNOSIS — I480 Paroxysmal atrial fibrillation: Secondary | ICD-10-CM | POA: Diagnosis present

## 2020-05-09 DIAGNOSIS — R680 Hypothermia, not associated with low environmental temperature: Secondary | ICD-10-CM | POA: Diagnosis present

## 2020-05-09 DIAGNOSIS — E7800 Pure hypercholesterolemia, unspecified: Secondary | ICD-10-CM | POA: Diagnosis present

## 2020-05-09 DIAGNOSIS — I959 Hypotension, unspecified: Secondary | ICD-10-CM | POA: Diagnosis present

## 2020-05-09 DIAGNOSIS — R471 Dysarthria and anarthria: Secondary | ICD-10-CM | POA: Diagnosis present

## 2020-05-09 DIAGNOSIS — C3491 Malignant neoplasm of unspecified part of right bronchus or lung: Secondary | ICD-10-CM | POA: Diagnosis not present

## 2020-05-09 DIAGNOSIS — G9340 Encephalopathy, unspecified: Secondary | ICD-10-CM | POA: Diagnosis not present

## 2020-05-09 DIAGNOSIS — Z8616 Personal history of COVID-19: Secondary | ICD-10-CM | POA: Diagnosis not present

## 2020-05-09 DIAGNOSIS — Z7401 Bed confinement status: Secondary | ICD-10-CM | POA: Diagnosis not present

## 2020-05-09 LAB — ECHOCARDIOGRAM COMPLETE
Area-P 1/2: 7.99 cm2
Calc EF: 52.1 %
Height: 75 in
S' Lateral: 2.7 cm
Single Plane A2C EF: 65.3 %
Single Plane A4C EF: 34.9 %
Weight: 2560.86 oz

## 2020-05-09 LAB — URINALYSIS, ROUTINE W REFLEX MICROSCOPIC
Bilirubin Urine: NEGATIVE
Glucose, UA: NEGATIVE mg/dL
Ketones, ur: NEGATIVE mg/dL
Nitrite: POSITIVE — AB
Protein, ur: 100 mg/dL — AB
RBC / HPF: >50 RBC/hpf — ABNORMAL HIGH (ref 0–5)
Specific Gravity, Urine: 1.041 — ABNORMAL HIGH (ref 1.005–1.030)
WBC, UA: >50 WBC/hpf — ABNORMAL HIGH (ref 0–5)
pH: 9 — ABNORMAL HIGH (ref 5.0–8.0)

## 2020-05-09 LAB — BASIC METABOLIC PANEL
Anion gap: 9 (ref 5–15)
BUN: 14 mg/dL (ref 8–23)
CO2: 25 mmol/L (ref 22–32)
Calcium: 9.2 mg/dL (ref 8.9–10.3)
Chloride: 109 mmol/L (ref 98–111)
Creatinine, Ser: 0.98 mg/dL (ref 0.61–1.24)
GFR, Estimated: >60 mL/min (ref >60)
Glucose, Bld: 95 mg/dL (ref 70–99)
Potassium: 3.9 mmol/L (ref 3.5–5.1)
Sodium: 143 mmol/L (ref 135–145)

## 2020-05-09 LAB — TROPONIN I (HIGH SENSITIVITY): Troponin I (High Sensitivity): 10 ng/L (ref <18)

## 2020-05-09 LAB — LACTIC ACID, PLASMA
Lactic Acid, Venous: 4 mmol/L (ref 0.5–1.9)
Lactic Acid, Venous: 2 mmol/L (ref 0.5–1.9)

## 2020-05-09 LAB — DIFFERENTIAL
Abs Immature Granulocytes: 0.03 10*3/uL (ref 0.00–0.07)
Basophils Absolute: 0 10*3/uL (ref 0.0–0.1)
Basophils Relative: 0 %
Eosinophils Absolute: 0 10*3/uL (ref 0.0–0.5)
Eosinophils Relative: 0 %
Immature Granulocytes: 0 %
Lymphocytes Relative: 25 %
Lymphs Abs: 2.2 10*3/uL (ref 0.7–4.0)
Monocytes Absolute: 0.7 10*3/uL (ref 0.1–1.0)
Monocytes Relative: 8 %
Neutro Abs: 5.8 10*3/uL (ref 1.7–7.7)
Neutrophils Relative %: 67 %

## 2020-05-09 LAB — CBC
HCT: 29.7 % — ABNORMAL LOW (ref 39.0–52.0)
Hemoglobin: 9.6 g/dL — ABNORMAL LOW (ref 13.0–17.0)
MCH: 21.2 pg — ABNORMAL LOW (ref 26.0–34.0)
MCHC: 32.3 g/dL (ref 30.0–36.0)
MCV: 65.6 fL — ABNORMAL LOW (ref 80.0–100.0)
Platelets: 298 10*3/uL (ref 150–400)
RBC: 4.53 MIL/uL (ref 4.22–5.81)
RDW: 16.4 % — ABNORMAL HIGH (ref 11.5–15.5)
WBC: 6.3 10*3/uL (ref 4.0–10.5)
nRBC: 0 % (ref 0.0–0.2)

## 2020-05-09 LAB — RESPIRATORY PANEL BY RT PCR (FLU A&B, COVID)
Influenza A by PCR: NEGATIVE
Influenza B by PCR: NEGATIVE
SARS Coronavirus 2 by RT PCR: NEGATIVE

## 2020-05-09 LAB — TSH: TSH: 1.103 u[IU]/mL (ref 0.350–4.500)

## 2020-05-09 LAB — CORTISOL-AM, BLOOD: Cortisol - AM: 12.2 ug/dL (ref 6.7–22.6)

## 2020-05-09 LAB — D-DIMER, QUANTITATIVE: D-Dimer, Quant: 1.17 ug/mL-FEU — ABNORMAL HIGH (ref 0.00–0.50)

## 2020-05-09 LAB — T4, FREE: Free T4: 1.13 ng/dL — ABNORMAL HIGH (ref 0.61–1.12)

## 2020-05-09 MED ORDER — LACTATED RINGERS IV SOLN: INTRAVENOUS | Status: AC

## 2020-05-09 MED ORDER — ACETAMINOPHEN 325 MG PO TABS: 650.0000 mg | ORAL_TABLET | Freq: Four times a day (QID) | ORAL | Status: DC | PRN

## 2020-05-09 MED ORDER — LACTATED RINGERS IV BOLUS
1000.0000 mL | Freq: Once | INTRAVENOUS | Status: AC
  Administered 2020-05-09: 1000 mL via INTRAVENOUS

## 2020-05-09 MED ORDER — BISACODYL 10 MG RE SUPP: 10.0000 mg | Freq: Every day | RECTAL | Status: DC | PRN

## 2020-05-09 MED ORDER — VANCOMYCIN HCL 750 MG/150ML IV SOLN
750.0000 mg | Freq: Two times a day (BID) | INTRAVENOUS | Status: DC
  Administered 2020-05-09 – 2020-05-10 (×2): 750 mg via INTRAVENOUS
  Filled 2020-05-09 (×3): qty 150

## 2020-05-09 MED ORDER — ACETAMINOPHEN 650 MG RE SUPP: 650.0000 mg | Freq: Four times a day (QID) | RECTAL | Status: DC | PRN

## 2020-05-09 MED ORDER — POLYETHYLENE GLYCOL 3350 17 G PO PACK: 17.0000 g | PACK | Freq: Every day | ORAL | Status: DC | PRN

## 2020-05-09 MED ORDER — PIPERACILLIN-TAZOBACTAM 3.375 G IVPB
3.3750 g | Freq: Three times a day (TID) | INTRAVENOUS | Status: DC
  Administered 2020-05-09 – 2020-05-10 (×4): 3.375 g via INTRAVENOUS
  Filled 2020-05-09 (×4): qty 50

## 2020-05-09 NOTE — ED Notes (Signed)
Off floor to MRI

## 2020-05-09 NOTE — Progress Notes (Signed)
Attempted MRI @ 600. First minute of scan was successful but then patient was in too much pain to continue. His catheter came out and he wet himself. I sent him back to ED to calm down, get cleaned up and changed and get some meds on board. RN to call when pt is ready.

## 2020-05-09 NOTE — ED Notes (Signed)
Ordered breakfast 

## 2020-05-09 NOTE — Evaluation (Signed)
Physical Therapy Evaluation Patient Details Name: Richard [name] MRN: [id number] DOB: [birth year] Today's Date: 05/09/2020   History of Present Illness  79 yo male wtih onset of unresponsiveness at home with family was brought to ED for tx.  Note his UTI with encephalopathy, R pleural effusion and dementia.  PMHx:  ventriculomegaly, stage 3 lung CA, respiratory failure, ischemic white matter changes on MRI, a-fib, PE, L THA with pain  Clinical Impression  Pt was seen for initial mobility and asked to go to [facility].  He is unable to fully stand due to flexion contractures of knees but can work to stretch them during therapy.  Longer range goals to walk but for now will need to work on transfers, possibly with sliding board, and to stand as tolerated.  Pt is without family to elaborate on their wishes but PT recommending SNF due to the significant needs this patient has.    Follow Up Recommendations SNF    Equipment Recommendations  None recommended by PT    Recommendations for Other Services       Precautions / Restrictions Precautions Precautions: Fall Precaution Comments: B knee flexion contractures,  Restrictions Weight Bearing Restrictions: No Other Position/Activity Restrictions: contractures of knees      Mobility  Bed Mobility Overal bed mobility: Needs Assistance Bed Mobility: Supine to Sit;Sit to Supine     Supine to sit: Mod assist Sit to supine: Mod assist   General bed mobility comments: mod due to L hip stiffness and contractures of knees    Transfers Overall transfer level: Needs assistance Equipment used: Rolling walker (2 wheeled);1 person hand held assist;2 person hand held assist Transfers: Sit to/from [facility] Sit to Stand: Max assist Stand pivot transfers: Max assist       General transfer comment: pt is a lift with stedy for nursing  Ambulation/Gait Ambulation/Gait assistance: Total assist           General Gait Details:  unable to walk  Stairs            Wheelchair Mobility    Modified Rankin (Stroke Patients Only)       Balance Overall balance assessment: Needs assistance Sitting-balance support: Feet supported;Bilateral upper extremity supported Sitting balance-Leahy Scale: Fair     Standing balance support: Bilateral upper extremity supported;During functional activity Standing balance-Leahy Scale: Zero                               Pertinent Vitals/Pain Pain Assessment: Faces Faces Pain Scale: Hurts little more Pain Location: L hip in bed with flexed limbs Pain Descriptors / Indicators: Guarding Pain Intervention(s): Limited activity within patient's tolerance;Monitored during session;Repositioned    Home Living Family/patient expects to be discharged to:: Private residence Living Arrangements: Children Available Help at Discharge: Family Type of Home: House Home Access: Stairs to enter   Technical brewer of Steps: 2-3 Home Layout: One level Home Equipment: Environmental consultant - 2 wheels Additional Comments: Information gained from previous admission.    Prior Function Level of Independence: Needs assistance   Gait / Transfers Assistance Needed: has lived with daughter but no info on her assistance   ADL's / Homemaking Assistance Needed: no family present to provide PLOF data        Hand Dominance   Dominant Hand: Right    Extremity/Trunk Assessment   Upper Extremity Assessment Upper Extremity Assessment: Generalized weakness    Lower Extremity Assessment Lower Extremity Assessment: Generalized  weakness    Cervical / Trunk Assessment Cervical / Trunk Assessment: Kyphotic  Communication   Communication: No difficulties  Cognition Arousal/Alertness: Awake/alert Behavior During Therapy: Flat affect Overall Cognitive Status: History of cognitive impairments - at baseline                                 General Comments: pt can give some  history      General Comments General comments (skin integrity, edema, etc.): pt is up to side of bed and BSC per his request but is quite dependent to do this    Exercises     Assessment/Plan    PT Assessment Patient needs continued PT services  PT Problem List Decreased strength;Decreased range of motion;Decreased activity tolerance;Decreased balance;Decreased mobility;Decreased coordination;Decreased cognition;Decreased knowledge of use of DME;Decreased safety awareness;Pain       PT Treatment Interventions DME instruction;Gait training;Stair training;Functional mobility training;Therapeutic activities;Therapeutic exercise;Balance training;Neuromuscular re-education;Patient/family education    PT Goals (Current goals can be found in the Care Plan section)  Acute Rehab PT Goals Patient Stated Goal: to go home today PT Goal Formulation: Patient unable to participate in goal setting Time For Goal Achievement: 05/23/20 Potential to Achieve Goals: Good    Frequency Min 2X/week   Barriers to discharge Inaccessible home environment;Decreased caregiver support home with daughter and chart reports stairs    Co-evaluation               AM-PAC PT "6 Clicks" Mobility  Outcome Measure Help needed turning from your back to your side while in a flat bed without using bedrails?: A Lot Help needed moving from lying on your back to sitting on the side of a flat bed without using bedrails?: A Lot Help needed moving to and from a bed to a chair (including a wheelchair)?: A Lot Help needed standing up from a chair using your arms (e.g., wheelchair or bedside chair)?: Total Help needed to walk in hospital room?: Total Help needed climbing 3-5 steps with a railing? : Total 6 Click Score: 9    End of Session Equipment Utilized During Treatment: Gait belt Activity Tolerance: Patient limited by fatigue;Treatment limited secondary to medical complications (Comment) Patient left: in  bed;with call bell/phone within reach;with bed alarm set;with nursing/sitter in room Nurse Communication: Mobility status PT Visit Diagnosis: Unsteadiness on feet (R26.81);Muscle weakness (generalized) (M62.81);Difficulty in walking, not elsewhere classified (R26.2);Pain Pain - Right/Left: Left Pain - part of body: Hip    Time: 1410-1440 PT Time Calculation (min) (ACUTE ONLY): 30 min   Charges:   PT Evaluation $PT Eval Moderate Complexity: 1 Mod PT Treatments $Therapeutic Activity: 8-22 mins       [name] 05/09/2020, 2:58 PM  Mee Hives, PT MS Acute Rehab Dept. Number: [place] and [place]

## 2020-05-09 NOTE — ED Provider Notes (Signed)
Patient signed out pending x-ray imaging.  Will require admission.  In brief presented as a possible code stroke.  Upon presentation it was more clear that he was likely septic.  Lactate 4.  Blood pressure initially low but responded well to 30 cc/kg of fluid.  He received broad-spectrum antibiotics.  He has a small pleural effusion but no respiratory complaints.  He was hypothermic.  Abdominal CT scan with some bladder findings recommend correlate with urinalysis.  I have requested twice for patient to be in and out cath.  Urinalysis still pending.  Clinically stable at this time.  Bear hugger in place.  We will plan for admission.   Physical Exam  BP 111/61 (BP Location: Right Arm)   Pulse (!) 103   Temp (!) 94.3 F (34.6 C) (Rectal)   Resp 13   Ht 1.905 m (6\' 3" )   Wt 72.6 kg   SpO2 96%   BMI 20.01 kg/m      Merryl Hacker, MD 05/09/20 [id number]

## 2020-05-09 NOTE — ED Notes (Signed)
Patient unable to stand for orthostatic VS

## 2020-05-09 NOTE — Progress Notes (Signed)
Pharmacy Antibiotic Note  Richard [name] is a 79 y.o. male admitted on 05/08/2020 with sepsis.  Pharmacy has been consulted for Vancomycin dosing. WBC WNL. Renal function ok.   Plan: Vancomycin 1500 mg IV x 1, then 750 mg IV q12h Zosyn per MD Trend WBC, temp, renal function  F/U infectious work-up Drug levels as indicated   Height: 6\' 3"  (190.5 cm) Weight: 72.6 kg (160 lb 0.9 oz) IBW/kg (Calculated) : 84.5  Temp (24hrs), Avg:94.3 F (34.6 C), Min:94.3 F (34.6 C), Max:94.3 F (34.6 C)  Recent Labs  Lab 05/08/20 2151 05/08/20 2230  WBC  --  8.7  CREATININE 0.90 1.19  LATICACIDVEN  --  4.0*    Estimated Creatinine Clearance: 53.4 mL/min (by C-G formula based on SCr of 1.19 mg/dL).    No Known Allergies  Narda Bonds, PharmD, BCPS Clinical Pharmacist Phone: [phone]

## 2020-05-09 NOTE — Progress Notes (Signed)
  Echocardiogram 2D Echocardiogram has been performed.  Darlina Sicilian M 05/09/2020, 11:11 AM

## 2020-05-09 NOTE — Progress Notes (Signed)
Pharmacy Antibiotic Note  Richard [name] is a 79 y.o. male admitted on 05/08/2020 with sepsis.  Pharmacy has been consulted for Vancomycin dosing. WBC WNL. Renal function ok.   Plan: Vancomycin 1500 mg IV x 1, then 750 mg IV q12h Zosyn x 1 in the ED, f/u additional gram negative coverage Trend WBC, temp, renal function  F/U infectious work-up Drug levels as indicated   Height: 6\' 3"  (190.5 cm) Weight: 72.6 kg (160 lb 0.9 oz) IBW/kg (Calculated) : 84.5  Temp (24hrs), Avg:94.3 F (34.6 C), Min:94.3 F (34.6 C), Max:94.3 F (34.6 C)  Recent Labs  Lab 05/08/20 2151 05/08/20 2230  WBC  --  8.7  CREATININE 0.90 1.19  LATICACIDVEN  --  4.0*    Estimated Creatinine Clearance: 53.4 mL/min (by C-G formula based on SCr of 1.19 mg/dL).    No Known Allergies  Narda Bonds, PharmD, BCPS Clinical Pharmacist Phone: [phone]

## 2020-05-09 NOTE — ED Notes (Signed)
Patient transported to X-ray 

## 2020-05-09 NOTE — Progress Notes (Addendum)
Patient was admitted after midnight.  Please see H&P.  Most likely source of sepsis is UTI-- once cultures back will narrow abx (most likely gram negative bacteria).  EEG with generalized slowing, MRI negative for CVA.   [name] DO  Addendum: patient has not been taking xarelto: ? Cost vs hematuria? -will need to determine-- appears to have been on due to h/o PE (2020) and a fib. -may need another palliative care consult- last seen 12/20

## 2020-05-09 NOTE — Procedures (Signed)
Patient Name: Richard [name]  MRN: [id number]  Epilepsy Attending: Lora Havens  Referring Physician/Provider: Dr. Shela Leff Date: 05/09/2020 Duration: 23.44 mins  Patient history: 79 year old male with brief period of sudden unresponsiveness. EEG to evaluate for seizures.  Level of alertness: Awake  AEDs during EEG study: None  Technical aspects: This EEG study was done with scalp electrodes positioned according to the 10-20 International system of electrode placement. Electrical activity was acquired at a sampling rate of 500Hz  and reviewed with a high frequency filter of 70Hz  and a low frequency filter of 1Hz . EEG data were recorded continuously and digitally stored.   Description: The posterior dominant rhythm consists of 8 Hz activity of moderate voltage (25-35 uV) seen predominantly in posterior head regions, symmetric and reactive to eye opening and eye closing. EEG showed continuous generalized 5 to 6 Hz theta as well as intermittent generalized 2-3Hz  delta slowing. Hyperventilation and photic stimulation were not performed.     ABNORMALITY -Continuous slow, generalized  IMPRESSION: This study is suggestive of mild diffuse encephalopathy, nonspecific etiology. No seizures or epileptiform discharges were seen throughout the recording.  Nyisha Clippard Barbra Sarks

## 2020-05-09 NOTE — Progress Notes (Signed)
Following for Code Sepsis  

## 2020-05-09 NOTE — Progress Notes (Signed)
Neurology Progress Note  S: Neuro f/up. Richard [name] is sitting up in the bed today eating his breakfast. He is a little stubborn and refuses to answer my questions. He does appear to have some underlying cognitive dysfunction but is awake, alert, smiling and follows basic commands.  O: Current vital signs: BP 136/68   Pulse (!) 110   Temp (!) 97.5 F (36.4 C)   Resp 19   Ht 6\' 3"  (1.905 m)   Wt 72.6 kg   SpO2 100%   BMI 20.01 kg/m  Vital signs in last 24 hours: Temp:  [94.3 F (34.6 C)-97.5 F (36.4 C)] 97.5 F (36.4 C) (11/24 1412) Pulse Rate:  [76-113] 110 (11/24 1428) Resp:  [13-41] 19 (11/24 1428) BP: (73-138)/(48-116) 136/68 (11/24 1412) SpO2:  [94 %-100 %] 100 % (11/24 1411) Weight:  [72.6 kg] 72.6 kg (11/23 2306) GENERAL: Awake, alert in NAD HEENT: Normocephalic and atraumatic, dry mm, no LN++ LUNGS: normal respiratory effort. Unlabored.  CV: RRR ABDOMEN: Soft Ext: warm NEURO:  Mental Status: alert, knows his name, day, month, or year. States he is in the hospital but not which one. States he is 79 yrs old, it is 28, and the month is March.  Language: speech is intact.  Naming, repetition, fluency intact. Comprehension to some commands/tasks is slowed or absent.  Cranial Nerves: PERRL. EOMI, visual fields difficult to test because he does not comprehend not moving his head. No facial asymmetry, facial sensation intact, hearing intact, tongue/uvula/soft palate midline, normal  sternocleidomastoid and trapezius muscle strength. No evidence of tongue atrophy or fasciculations. Motor: 5/5 throughout Tone: is normal and bulk is normal Sensation- Intact to light touch bilaterally Coordination: Does not understand FNF instructions.  Gait- deferred  Medications  Current Facility-Administered Medications:  .  acetaminophen (TYLENOL) tablet 650 mg, 650 mg, Oral, Q6H PRN **OR** acetaminophen (TYLENOL) suppository 650 mg, 650 mg, Rectal, Q6H PRN, Shela Leff, MD .   bisacodyl (DULCOLAX) suppository 10 mg, 10 mg, Rectal, Daily PRN, Eliseo Squires, Jessica U, DO .  lactated ringers infusion, , Intravenous, Continuous, Shela Leff, MD, Last Rate: 125 mL/hr at 05/09/20 1238, New Bag at 05/09/20 1238 .  piperacillin-tazobactam (ZOSYN) IVPB 3.375 g, 3.375 g, Intravenous, Q8H, Shela Leff, MD, Last Rate: 12.5 mL/hr at 05/09/20 1406, 3.375 g at 05/09/20 1406 .  polyethylene glycol (MIRALAX / GLYCOLAX) packet 17 g, 17 g, Oral, Daily PRN, Eliseo Squires, Jessica U, DO .  sodium chloride flush (NS) 0.9 % injection 3 mL, 3 mL, Intravenous, Once, Schlossman, Erin, MD .  vancomycin (VANCOREADY) IVPB 750 mg/150 mL, 750 mg, Intravenous, Q12H, Shela Leff, MD Labs CBC    Component Value Date/Time   WBC 6.3 05/09/2020 0958   RBC 4.53 05/09/2020 0958   HGB 9.6 (L) 05/09/2020 0958   HGB 12.8 (L) 12/02/2019 1352   HGB 11.9 (L) 10/07/2019 0829   HCT 29.7 (L) 05/09/2020 0958   HCT 39.7 10/07/2019 0829   PLT 298 05/09/2020 0958   PLT 329 12/02/2019 1352   PLT 438 10/07/2019 0829   MCV 65.6 (L) 05/09/2020 0958   MCV 74 (L) 10/07/2019 0829   MCH 21.2 (L) 05/09/2020 0958   MCHC 32.3 05/09/2020 0958   RDW 16.4 (H) 05/09/2020 0958   RDW 17.8 (H) 10/07/2019 0829   LYMPHSABS 2.2 05/08/2020 2230   LYMPHSABS 2.2 10/07/2019 0829   MONOABS 0.7 05/08/2020 2230   EOSABS 0.0 05/08/2020 2230   EOSABS 0.1 10/07/2019 0829   BASOSABS 0.0 05/08/2020 2230   BASOSABS  0.0 10/07/2019 0829    CMP     Component Value Date/Time   NA 143 05/09/2020 0958   NA 142 10/07/2019 0829   K 3.9 05/09/2020 0958   CL 109 05/09/2020 0958   CO2 25 05/09/2020 0958   GLUCOSE 95 05/09/2020 0958   BUN 14 05/09/2020 0958   BUN 11 10/07/2019 0829   CREATININE 0.98 05/09/2020 0958   CREATININE 0.84 12/02/2019 1352   CALCIUM 9.2 05/09/2020 0958   PROT 7.8 05/08/2020 2230   PROT 7.5 10/07/2019 0829   ALBUMIN 4.0 05/08/2020 2230   ALBUMIN 4.3 10/07/2019 0829   AST 37 05/08/2020 2230   AST 18  12/02/2019 1352   ALT 30 05/08/2020 2230   ALT 12 12/02/2019 1352   ALKPHOS 103 05/08/2020 2230   BILITOT 0.6 05/08/2020 2230   BILITOT 0.3 12/02/2019 1352   GFRNONAA >60 05/09/2020 0958   GFRNONAA >60 12/02/2019 1352   GFRAA >60 03/01/2020 2320   GFRAA >60 12/02/2019 1352    glycosylated hemoglobin  Lipid Panel     Component Value Date/Time   CHOL 252 (H) 10/29/2016 1513   TRIG 188 (H) 10/29/2016 1513   HDL 62 10/29/2016 1513   CHOLHDL 4.1 10/29/2016 1513   LDLCALC 152 (H) 10/29/2016 1513     Imaging I have reviewed images in epic and the results pertinent to this consultation are: MRI-no acute intracranial abnormality or significant interval change since last MRI in 2020-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 2021-Moderate generalized atrophy. Chronic microvascular ischemia.   Impression:  79 yo with syncope vs seizure. His decreased responsiveness was coupled with low BP, and his report of lightheadedness would be most consistent with syncope.  With patients with dementia, spell duration is not always as reliable. Certainly, with his previous stroke, this could explain focal deficits in the setting of hypoperfusion, but could also support a todd's phenomenon. Given the global nature, I think that stroke/tia much less likely. Workup so far with MRI brain is neg for acute stroke and no obvious abnormality that could be a seizure focus. EEG neg for seizure activity. + for mild diffuse encephalopathy.   Recommendations:  1. Restart Xarelto if possible-see TRH note.  2. No further inpatient neurological workup 3. Follow up with outpatient Neurologist 4. Sepsis-tx'd by [facility].   Neurology inpatient team will signoff. Please feel free to contact us with any questions or concerns.  [place] Pager Number [phone]

## 2020-05-09 NOTE — Progress Notes (Signed)
PT Cancellation Note  Patient Details Name: Richard [name] MRN: [id number] DOB: Nov 01, 1940   Cancelled Treatment:    Reason Eval/Treat Not Completed: Other (comment).  Pt was in ED, now in room and could not see per nsg where he is being seen and received.  Try again as time and pt allow.   [name] 05/09/2020, 1:57 PM   Mee Hives, PT MS Acute Rehab Dept. Number: [place] and [place]

## 2020-05-09 NOTE — ED Notes (Signed)
EEG at bedside. Pt voiced agreement and is cooperative at this time.

## 2020-05-09 NOTE — H&P (Signed)
History and Physical    Richard [name] HKV:[id number] DOB: 1940-09-29 DOA: 05/08/2020  PCP: [name], NP Patient coming from: Home  Chief Complaint: Code stroke  HPI: Richard [name] is a 79 y.o. 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 Julien Nordmann, paroxysmal A. fib and PE on Xarelto, hypertension, hyperlipidemia, COVID-19 infection in September this year presented to the ED via EMS as code stroke for evaluation of unresponsiveness. Reportedly patient was in his normal state of health and became suddenly unresponsive at 1700 while having dinner with family. When EMS arrived they noticed that he had right gaze deviation, slurred speech, and was hypotensive with blood pressure in the 80s. Patient was given IV fluids and blood pressure improved.  History provided by patient very limited given his dementia.  He is not sure why he is here.  His only complaint is feeling dizzy yesterday.  Denies cough, shortness of breath, chest pain, nausea, vomiting, abdominal pain, or dysuria.  ED Course: Hypothermic with temperature 94.3 F, bear hugger placed in the ED. Tachycardic and tachypneic. Hypotensive with systolic as low as 56E. WBC 8.7, hemoglobin 13.7, hematocrit 43.9, platelet 409K. Sodium 142, potassium 3.9, chloride 106, bicarb 22, BUN 18, creatinine 1.1, glucose 159, LFTs normal, INR 1.2. Lactic acid elevated at 4.0. Blood culture x2 pending. TSH and free T4 pending. UA and urine culture pending. SARS-CoV-2 PCR test and influenza panel negative. Chest x-ray showing small right sided pleural effusion and posttreatment changes in the right lung zone likely related to patient's prior history of lung cancer. CT abdomen pelvis showing no acute infectious process except diffusely thickened gallbladder wall felt to be due to chronic outlet obstruction or cystitis. X-ray of right hip/pelvis showing advanced degenerative changes without  evidence of an acute displaced fracture or dislocation.  Head CT showing unchanged severe ventriculomegaly and chronic ischemic microangiopathic; no acute intracranial abnormality. Patient was seen by neurology and the episode of unresponsiveness was felt to be either syncope or seizure. Stroke/TIA felt to be less likely. Neurology recommended obtaining brain MRI, MRA head and neck, and EEG.  Patient was given 30 cc/kg fluid boluses per sepsis protocol. He was given vancomycin and Zosyn.  Review of Systems:  All systems reviewed and apart from history of presenting illness, are negative.  Past Medical History:  Diagnosis Date  . Acute pulmonary embolism ([place]) 01/30/2019  . Acute respiratory failure with hypoxia ([place]) 01/30/2019  . AKI (acute kidney injury) ([place]) 01/30/2019  . Atrial fibrillation with RVR ([place])   . Dementia ([place])   . High cholesterol   . Hypertension   . Lung mass 12/2018  . Severe sepsis ([place]) 01/30/2019    Past Surgical History:  Procedure Laterality Date  . IR GASTROSTOMY TUBE MOD SED  05/25/2019  . IR GASTROSTOMY TUBE REMOVAL  09/08/2019  . JOINT REPLACEMENT    . VIDEO BRONCHOSCOPY WITH ENDOBRONCHIAL ULTRASOUND N/A 12/16/2018   Procedure: VIDEO BRONCHOSCOPY WITH ENDOBRONCHIAL ULTRASOUND AND FLUROSCOPY;  Surgeon: [name], MD;  Location: [place];  Service: Pulmonary;  Laterality: N/A;     reports that he has quit smoking. His smoking use included cigarettes. He has a 60.00 pack-year smoking history. He has never used smokeless tobacco. He reports current alcohol use. He reports that he does not use drugs.  No Known Allergies  Family History  Problem Relation Age of Onset  . Stomach cancer Mother   . COPD Father   . Lung  cancer Brother   . Lung cancer Brother     Prior to Admission medications   Medication Sig Start Date End Date Taking? Authorizing Provider  cephALEXin (KEFLEX) 500 MG capsule Take 1 capsule (500 mg total) by mouth 4 (four) times daily.  04/07/20   Antonietta Breach, PA-C  ferrous sulfate 325 (65 FE) MG tablet Take 1 tablet (325 mg total) by mouth daily with breakfast. Patient not taking: Reported on 03/01/2020 09/27/19   Regalado, Jerald Kief A, MD  Multiple Vitamin (MULTIVITAMIN WITH MINERALS) TABS tablet Take 1 tablet by mouth daily. Patient not taking: Reported on 03/01/2020 02/21/20   Mercy Riding, MD  pantoprazole (PROTONIX) 40 MG tablet Take 1 tablet (40 mg total) by mouth daily. Patient not taking: Reported on 02/17/2020 09/28/19   Regalado, Jerald Kief A, MD  polyethylene glycol (MIRALAX / GLYCOLAX) 17 g packet Take 17 g by mouth 2 (two) times daily. Patient not taking: Reported on 02/17/2020 09/27/19   Regalado, Jerald Kief A, MD  STOOL SOFTENER/LAXATIVE 50-8.6 MG tablet TAKE 1 TABLET BY MOUTH TWICE DAILY Patient not taking: Reported on 03/01/2020 01/02/20   Curt Bears, MD  XARELTO 20 MG TABS tablet TAKE 1 TABLET BY MOUTH ONCE DAILY WITH  SUPPER. 04/18/20   Curt Bears, MD    Physical Exam: Vitals:   05/08/20 2306 05/09/20 0130 05/09/20 0237 05/09/20 0303  BP:  111/61 104/61 97/62  Pulse:  (!) 103 (!) 105 (!) 105  Resp:  13 (!) 30 (!) 27  Temp:    (!) 97 F (36.1 C)  TempSrc:    Rectal  SpO2:  96% 97% 98%  Weight: 72.6 kg     Height: 6\' 3"  (1.905 m)       Physical Exam Constitutional:      General: He is not in acute distress. HENT:     Head: Normocephalic and atraumatic.  Eyes:     Extraocular Movements: Extraocular movements intact.     Conjunctiva/sclera: Conjunctivae normal.  Cardiovascular:     Rate and Rhythm: Normal rate and regular rhythm.     Pulses: Normal pulses.  Pulmonary:     Effort: Pulmonary effort is normal. No respiratory distress.     Breath sounds: No wheezing.  Abdominal:     General: Bowel sounds are normal. There is no distension.     Palpations: Abdomen is soft.     Tenderness: There is no abdominal tenderness.  Musculoskeletal:        General: No swelling or tenderness.     Cervical back:  Normal range of motion and neck supple.  Skin:    General: Skin is warm and dry.  Neurological:     Mental Status: He is alert.     Comments: Oriented to person and place only Moving all extremities spontaneously     Labs on Admission: I have personally reviewed following labs and imaging studies  CBC: Recent Labs  Lab 05/08/20 2151 05/08/20 2230  WBC  --  8.7  NEUTROABS  --  5.8  HGB 14.6 13.7  HCT 43.0 43.9  MCV  --  66.2*  PLT  --  701*   Basic Metabolic Panel: Recent Labs  Lab 05/08/20 2151 05/08/20 2230  NA 143 142  K 4.3 3.9  CL 111 106  CO2  --  22  GLUCOSE 150* 159*  BUN 27* 18  CREATININE 0.90 1.19  CALCIUM  --  10.5*   GFR: Estimated Creatinine Clearance: 53.4 mL/min (by C-G formula based on  SCr of 1.19 mg/dL). Liver Function Tests: Recent Labs  Lab 05/08/20 2230  AST 37  ALT 30  ALKPHOS 103  BILITOT 0.6  PROT 7.8  ALBUMIN 4.0   No results for input(s): LIPASE, AMYLASE in the last 168 hours. No results for input(s): AMMONIA in the last 168 hours. Coagulation Profile: Recent Labs  Lab 05/08/20 2230  INR 1.2   Cardiac Enzymes: No results for input(s): CKTOTAL, CKMB, CKMBINDEX, TROPONINI in the last 168 hours. BNP (last 3 results) No results for input(s): PROBNP in the last 8760 hours. HbA1C: No results for input(s): HGBA1C in the last 72 hours. CBG: Recent Labs  Lab 05/08/20 2140  GLUCAP 95   Lipid Profile: No results for input(s): CHOL, HDL, LDLCALC, TRIG, CHOLHDL, LDLDIRECT in the last 72 hours. Thyroid Function Tests: No results for input(s): TSH, T4TOTAL, FREET4, T3FREE, THYROIDAB in the last 72 hours. Anemia Panel: No results for input(s): VITAMINB12, FOLATE, FERRITIN, TIBC, IRON, RETICCTPCT in the last 72 hours. Urine analysis:    Component Value Date/Time   COLORURINE AMBER (A) 04/06/2020 2139   APPEARANCEUR TURBID (A) 04/06/2020 2139   LABSPEC  04/06/2020 2139    TEST NOT REPORTED DUE TO COLOR INTERFERENCE OF URINE  PIGMENT   PHURINE  04/06/2020 2139    TEST NOT REPORTED DUE TO COLOR INTERFERENCE OF URINE PIGMENT   GLUCOSEU (A) 04/06/2020 2139    TEST NOT REPORTED DUE TO COLOR INTERFERENCE OF URINE PIGMENT   HGBUR (A) 04/06/2020 2139    TEST NOT REPORTED DUE TO COLOR INTERFERENCE OF URINE PIGMENT   BILIRUBINUR (A) 04/06/2020 2139    TEST NOT REPORTED DUE TO COLOR INTERFERENCE OF URINE PIGMENT   KETONESUR (A) 04/06/2020 2139    TEST NOT REPORTED DUE TO COLOR INTERFERENCE OF URINE PIGMENT   PROTEINUR (A) 04/06/2020 2139    TEST NOT REPORTED DUE TO COLOR INTERFERENCE OF URINE PIGMENT   NITRITE (A) 04/06/2020 2139    TEST NOT REPORTED DUE TO COLOR INTERFERENCE OF URINE PIGMENT   LEUKOCYTESUR (A) 04/06/2020 2139    TEST NOT REPORTED DUE TO COLOR INTERFERENCE OF URINE PIGMENT    Radiological Exams on Admission: CT ABDOMEN PELVIS W CONTRAST  Result Date: 05/09/2020 CLINICAL DATA:  Acute abdominal pain. Patient was unresponsive at dinner but now all ir to self. History of blood clots and cancer. EXAM: CT ABDOMEN AND PELVIS WITH CONTRAST TECHNIQUE: Multidetector CT imaging of the abdomen and pelvis was performed using the standard protocol following bolus administration of intravenous contrast. CONTRAST:  154mL OMNIPAQUE IOHEXOL 300 MG/ML  SOLN COMPARISON:  01/31/2020 FINDINGS: Lower chest: Small right pleural effusion with patchy infiltration or scarring in the right lung base, unchanged since prior study. 6 mm nodule in the left lung base is unchanged since prior study. Volume loss in the right lung. Hepatobiliary: No focal liver abnormality is seen. No gallstones, gallbladder wall thickening, or biliary dilatation. Pancreas: Unremarkable. No pancreatic ductal dilatation or surrounding inflammatory changes. Spleen: Normal in size without focal abnormality. Adrenals/Urinary Tract: Adrenal glands are unremarkable. Kidneys are normal, without renal calculi, focal lesion, or hydronephrosis. Bladder wall is diffusely  thickened, possibly due to chronic outlet obstruction or cystitis. Layering debris in the dependent bladder may represent evidence of hemorrhage or infection. Stomach/Bowel: Stomach is fluid-filled without abnormal distention or wall thickening. Small bowel are decompressed. Stool fills the colon with particular prominence of rectal stool. No wall thickening or inflammatory changes are demonstrated. Appendix is normal. Vascular/Lymphatic: Aortic atherosclerosis. No enlarged abdominal or  pelvic lymph nodes. Reproductive: Lobular enlargement of the prostate gland, impressing on the bladder base. Other: No free air or free fluid in the abdomen. Abdominal wall musculature appears intact. Musculoskeletal: Postoperative left hip arthroplasty. Degenerative changes in the spine. No destructive bone lesions. IMPRESSION: 1. No acute process demonstrated in the abdomen or pelvis. 2. Small right pleural effusion with patchy infiltration or scarring in the right lung base, unchanged since prior study. 6 mm nodule in the left lung base is unchanged since prior study. 3. Lobular enlargement of the prostate gland, impressing on the bladder base. 4. Bladder wall is diffusely thickened, possibly due to chronic outlet obstruction or cystitis. Layering debris in the dependent bladder may represent evidence of hemorrhage or infection. Correlation with urinalysis recommended. 5. Stool fills the colon with particular prominence of rectal stool. No evidence of bowel obstruction. 6. Aortic atherosclerosis. Aortic Atherosclerosis (ICD10-I70.0). Electronically Signed   By: Lucienne Capers M.D.   On: 05/09/2020 00:07   DG Chest Port 1 View  Result Date: 05/09/2020 CLINICAL DATA:  Sepsis. EXAM: PORTABLE CHEST 1 VIEW COMPARISON:  02/16/2020 FINDINGS: There are airspace opacities in the right lower lung zone and right mid lung zone. No pneumothorax. There is elevation of the right hemidiaphragm. Emphysematous changes are noted. There is  right-sided volume loss. There is no acute displaced fracture. There is a small right-sided pleural effusion. The heart size appears unremarkable. IMPRESSION: 1. Small right-sided pleural effusion. 2. Again noted are post treatment changes in the right lung zone likely related to the patient's prior history of lung cancer. Electronically Signed   By: Constance Holster M.D.   On: 05/09/2020 01:07   DG Hip Unilat W or Wo Pelvis 2-3 Views Right  Result Date: 05/09/2020 CLINICAL DATA:  Pain EXAM: DG HIP (WITH OR WITHOUT PELVIS) 2-3V RIGHT COMPARISON:  CT from same day FINDINGS: There are advanced degenerative changes of the right hip without evidence for an acute displaced fracture or dislocation. The patient is status post total hip arthroplasty on the left. IMPRESSION: Advanced degenerative changes of the right hip without evidence for an acute displaced fracture or dislocation. Electronically Signed   By: Constance Holster M.D.   On: 05/09/2020 01:10   CT HEAD CODE STROKE WO CONTRAST  Result Date: 05/08/2020 CLINICAL DATA:  Code stroke.  Right facial droop EXAM: CT HEAD WITHOUT CONTRAST TECHNIQUE: Contiguous axial images were obtained from the base of the skull through the vertex without intravenous contrast. COMPARISON:  11/04/2019 FINDINGS: Brain: There is no mass, hemorrhage or extra-axial collection. Severe ventriculomegaly, unchanged. There is hypoattenuation of the periventricular white matter, most commonly indicating chronic ischemic microangiopathy. Old right occipital infarct. Vascular: No abnormal hyperdensity of the major intracranial arteries or dural venous sinuses. No intracranial atherosclerosis. Skull: The visualized skull base, calvarium and extracranial soft tissues are normal. Sinuses/Orbits: No fluid levels or advanced mucosal thickening of the visualized paranasal sinuses. No mastoid or middle ear effusion. The orbits are normal. ASPECTS [facility] Stroke Program Early CT Score) -  Ganglionic level infarction (caudate, lentiform nuclei, internal capsule, insula, M1-M3 cortex): 7 - Supraganglionic infarction (M4-M6 cortex): 3 Total score (0-10 with 10 being normal): 10 IMPRESSION: 1. No acute intracranial abnormality. 2. ASPECTS is 10. 3. 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 These results were communicated to Dr. Roland Rack at 9:57 pm on 05/08/2020 by text page via the [facility] messaging system. Electronically Signed   By: Ulyses Jarred M.D.   On: 05/08/2020 21:57  EKG: Independently reviewed. Sinus tachycardia.  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 Tachycardic and tachypneic. Hypotensive with systolic in the low 78M. Lactic acid elevated at 4.0. SARS-CoV-2 PCR test and influenza panel negative. Chest x-ray not suggestive of pneumonia. CT abdomen pelvis without obvious acute infectious process except diffusely thickened gallbladder wall which could be due to chronic outlet obstruction or cystitis. -Continue broad-spectrum antibiotics including vancomycin and Zosyn. Patient received 30 cc/kg fluid boluses per sepsis protocol and blood pressure has now improved. He was placed on Bair hugger in the ED for hypothermia and temperature has now improved. UA and urine culture pending. Blood culture x2 pending. Trend lactate.  Transient altered level of consciousness: Differential includes syncope versus seizure. Patient was hypotensive with EMS. Head CT negative for acute intracranial abnormality. Neurology feels that stroke/TIA is less likely but recommending further work-up. -Brain MRI, MRA head and neck, EEG ordered per neurology recommendations. Continue cardiac monitoring, order echocardiogram.  Check orthostatics. Does have a history of prior PE and is on chronic anticoagulation. Not hypoxic at present. Check D-dimer level. EKG without acute ischemic changes, check high-sensitivity troponin level.  Hypothermia: Suspect due to severe sepsis. Placed on Bair hugger in the ED and temperature now improved. -TSH and free T4 levels pending. Check cortisol level.  Stage III squamous cell carcinoma of the lung: Followed by Dr. Julien Nordmann from oncology. -Ensure oncology follow-up  Paroxysmal A. Fib: Currently in sinus rhythm. -Resume Xarelto after pharmacy med rec is done  History of PE -Resume Xarelto after pharmacy med rec is done  Hypertension -Hold antihypertensives given severe sepsis/hypotension  DVT prophylaxis: Resume Xarelto after pharmacy med rec is done. Code Status: Full code Family Communication: No family available at this time. Disposition Plan: Status is: Inpatient  Remains inpatient appropriate because:Altered mental status, IV treatments appropriate due to intensity of illness or inability to take PO and Inpatient level of care appropriate due to severity of illness   Dispo: The patient is from: Home              Anticipated d/c is to: Home              Anticipated d/c date is: 3 days              Patient currently is not medically stable to d/c.  The medical decision making on this patient was of high complexity and the patient is at high risk for clinical deterioration, therefore this is a level 3 visit.  Shela Leff MD [facility]  If 7PM-7AM, please contact night-coverage [email or web address]  05/09/2020, 4:01 AM

## 2020-05-09 NOTE — Progress Notes (Signed)
With patient's approval, provided update to [name], daughter about patient's current status and plan of care.

## 2020-05-09 NOTE — Progress Notes (Signed)
Portable EEG completed, results pending. 

## 2020-05-10 LAB — BASIC METABOLIC PANEL
Anion gap: 11 (ref 5–15)
BUN: 12 mg/dL (ref 8–23)
CO2: 22 mmol/L (ref 22–32)
Calcium: 9.2 mg/dL (ref 8.9–10.3)
Chloride: 109 mmol/L (ref 98–111)
Creatinine, Ser: 0.95 mg/dL (ref 0.61–1.24)
GFR, Estimated: >60 mL/min (ref >60)
Glucose, Bld: 80 mg/dL (ref 70–99)
Potassium: 3.7 mmol/L (ref 3.5–5.1)
Sodium: 142 mmol/L (ref 135–145)

## 2020-05-10 LAB — CBC
HCT: 29.7 % — ABNORMAL LOW (ref 39.0–52.0)
Hemoglobin: 9.7 g/dL — ABNORMAL LOW (ref 13.0–17.0)
MCH: 20.9 pg — ABNORMAL LOW (ref 26.0–34.0)
MCHC: 32.7 g/dL (ref 30.0–36.0)
MCV: 63.9 fL — ABNORMAL LOW (ref 80.0–100.0)
Platelets: 295 10*3/uL (ref 150–400)
RBC: 4.65 MIL/uL (ref 4.22–5.81)
RDW: 15.9 % — ABNORMAL HIGH (ref 11.5–15.5)
WBC: 5.1 10*3/uL (ref 4.0–10.5)
nRBC: 0 % (ref 0.0–0.2)

## 2020-05-10 LAB — IRON AND TIBC
Iron: 51 ug/dL (ref 45–182)
Saturation Ratios: 11 % — ABNORMAL LOW (ref 17.9–39.5)
TIBC: 468 ug/dL — ABNORMAL HIGH (ref 250–450)
UIBC: 417 ug/dL

## 2020-05-10 LAB — FERRITIN: Ferritin: 17 ng/mL — ABNORMAL LOW (ref 24–336)

## 2020-05-10 MED ORDER — SODIUM CHLORIDE 0.9 % IV SOLN
1.0000 g | INTRAVENOUS | Status: DC
  Administered 2020-05-10 – 2020-05-12 (×3): 1 g via INTRAVENOUS
  Filled 2020-05-10 (×3): qty 10

## 2020-05-10 MED ORDER — RIVAROXABAN 20 MG PO TABS
20.0000 mg | ORAL_TABLET | Freq: Every day | ORAL | Status: DC
  Administered 2020-05-10 – 2020-05-18 (×9): 20 mg via ORAL
  Filled 2020-05-10 (×9): qty 1

## 2020-05-10 NOTE — Progress Notes (Signed)
Progress Note    Richard [name]  ZOX:[id number] DOB: 05-22-1941  DOA: 05/08/2020 PCP: [name], NP    Brief Narrative:     Medical records reviewed and are as summarized below:  Richard [name] is an 79 y.o. 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 Julien Nordmann, paroxysmal A. fib and PE on Xarelto, hypertension, hyperlipidemia, COVID-19 infection in September this year presented to the ED via EMS as code stroke for evaluation of unresponsiveness. Reportedly patient was in his normal state of health and became suddenly unresponsive at 1700 while having dinner with family. When EMS arrived they noticed that he had right gaze deviation, slurred speech, and was hypotensive with blood pressure in the 80s. Patient was given IV fluids and blood pressure improved.    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 Tachycardic and tachypneic. Hypotensive with systolic in the low 81X. Lactic acid elevated at 4.0. SARS-CoV-2 PCR test and influenza panel negative. Chest x-ray not suggestive of pneumonia.  -u/a suggestive of UTI -culture with proteus -narrow abx to rocephin  Transient altered level of consciousness:  -per neurology: With patients with dementia, spell duration is not always as reliable. Certainly, with his previous stroke, this could explain focal deficits in the setting of hypoperfusion, but could also support a todd's phenomenon. Given the global nature, I think that stroke/tia much less likely.Workup so far with MRI brain is neg for acute stroke and no obvious abnormality that could be a seizure focus. EEG neg for  seizure activity. + for mild diffuse encephalopathy. -PT eval  Hypothermia: Suspect due to severe sepsis. Placed on Bair hugger in the ED and temperature now improved.  Stage III squamous cell carcinoma of the lung: Followed by Dr. Julien Nordmann from oncology.  Paroxysmal A. Fib:  -Resume Xarelto   History of PE -Resume Xarelto- will need to ensure not a fall risk and he can afford  Hypertension -Hold antihypertensives given severe sepsis/hypotension   Family Communication/Anticipated D/C date and plan/Code Status   DVT prophylaxis: xarelto Code Status: Full Code.  Disposition Plan: Status is: Inpatient  Remains inpatient appropriate because:Inpatient level of care appropriate due to severity of illness   Dispo: The patient is from: Home              Anticipated d/c is to: tbd              Anticipated d/c date is: 2 days              Patient currently is not medically stable to d/c. needs PT eval         Medical Consultants:    neurology  Subjective:   IV leaking this AM, said his breakfast came early  Objective:    Vitals:   05/09/20 2352 05/10/20 0502 05/10/20 0810 05/10/20 1150  BP: 113/60 119/67 (!) 143/79 100/73  Pulse: (!) 103 (!) 107 (!) 102 (!) 102  Resp: 18 18 20 18   Temp: 97.7 F (36.5 C) 98.2 F (36.8 C)  98.6 F (37 C)  TempSrc: Oral Oral Oral Oral  SpO2: 100% 95% 98% 99%  Weight:  Height:        Intake/Output Summary (Last 24 hours) at 05/10/2020 1431 Last data filed at 05/10/2020 0935 Gross per 24 hour  Intake 2538.78 ml  Output 500 ml  Net 2038.78 ml   Filed Weights   05/08/20 2306  Weight: 72.6 kg    Exam:  General: Appearance:    Chronically ill appearing male in no acute distress     Lungs:    respirations unlabored  Heart:    Tachycardic.  MS:   All extremities are intact.   Neurologic:   Awake, alert, pleasant and cooperative    Data Reviewed:   I have personally reviewed following labs and imaging  studies:  Labs: Labs show the following:   Basic Metabolic Panel: Recent Labs  Lab 05/08/20 2151 05/08/20 2151 05/08/20 2230 05/08/20 2230 05/09/20 0958 05/10/20 0242  NA 143  --  142  --  143 142  K 4.3   < > 3.9   < > 3.9 3.7  CL 111  --  106  --  109 109  CO2  --   --  22  --  25 22  GLUCOSE 150*  --  159*  --  95 80  BUN 27*  --  18  --  14 12  CREATININE 0.90  --  1.19  --  0.98 0.95  CALCIUM  --   --  10.5*  --  9.2 9.2   < > = values in this interval not displayed.   GFR Estimated Creatinine Clearance: 66.9 mL/min (by C-G formula based on SCr of 0.95 mg/dL). Liver Function Tests: Recent Labs  Lab 05/08/20 2230  AST 37  ALT 30  ALKPHOS 103  BILITOT 0.6  PROT 7.8  ALBUMIN 4.0   No results for input(s): LIPASE, AMYLASE in the last 168 hours. No results for input(s): AMMONIA in the last 168 hours. Coagulation profile Recent Labs  Lab 05/08/20 2230  INR 1.2    CBC: Recent Labs  Lab 05/08/20 2151 05/08/20 2230 05/09/20 0958 05/10/20 0242  WBC  --  8.7 6.3 5.1  NEUTROABS  --  5.8  --   --   HGB 14.6 13.7 9.6* 9.7*  HCT 43.0 43.9 29.7* 29.7*  MCV  --  66.2* 65.6* 63.9*  PLT  --  409* 298 295   Cardiac Enzymes: No results for input(s): CKTOTAL, CKMB, CKMBINDEX, TROPONINI in the last 168 hours. BNP (last 3 results) No results for input(s): PROBNP in the last 8760 hours. CBG: Recent Labs  Lab 05/08/20 2140  GLUCAP 95   D-Dimer: Recent Labs    05/09/20 0647  DDIMER 1.17*   Hgb A1c: No results for input(s): HGBA1C in the last 72 hours. Lipid Profile: No results for input(s): CHOL, HDL, LDLCALC, TRIG, CHOLHDL, LDLDIRECT in the last 72 hours. Thyroid function studies: Recent Labs    05/09/20 0519  TSH 1.103   Anemia work up: Recent Labs    05/10/20 0808  FERRITIN 17*  TIBC 468*  IRON 51   Sepsis Labs: Recent Labs  Lab 05/08/20 2230 05/09/20 0110 05/09/20 0958 05/10/20 0242  WBC 8.7  --  6.3 5.1  LATICACIDVEN 4.0* 2.0*  --    --     Microbiology Recent Results (from the past 240 hour(s))  Blood Culture (routine x 2)     Status: None (Preliminary result)   Collection Time: 05/08/20 10:30 PM   Specimen: BLOOD  Result Value Ref Range Status   Specimen  Description BLOOD RIGHT ANTECUBITAL  Final   Special Requests   Final    BOTTLES DRAWN AEROBIC AND ANAEROBIC Blood Culture adequate volume   Culture   Final    NO GROWTH 1 DAY Performed at [facility], [address] [place], [place] [address]    Report Status PENDING  Incomplete  Blood Culture (routine x 2)     Status: None (Preliminary result)   Collection Time: 05/08/20 10:34 PM   Specimen: BLOOD LEFT FOREARM  Result Value Ref Range Status   Specimen Description BLOOD LEFT FOREARM  Final   Special Requests   Final    BOTTLES DRAWN AEROBIC AND ANAEROBIC Blood Culture adequate volume   Culture   Final    NO GROWTH 1 DAY Performed at [facility], [place] [address], [place], [place] [address]    Report Status PENDING  Incomplete  Respiratory Panel by RT PCR (Flu A&B, Covid) - Nasopharyngeal Swab     Status: None   Collection Time: 05/09/20  1:38 AM   Specimen: Nasopharyngeal Swab; Nasopharyngeal(NP) swabs in vial transport medium  Result Value Ref Range Status   SARS Coronavirus 2 by RT PCR NEGATIVE NEGATIVE Final    Comment: (NOTE) SARS-CoV-2 target nucleic acids are NOT DETECTED.  The SARS-CoV-2 RNA is generally detectable in upper respiratoy specimens during the acute phase of infection. The lowest concentration of SARS-CoV-2 viral copies this assay can detect is 131 copies/mL. A negative result does not preclude SARS-Cov-2 infection and should not be used as the sole basis for treatment or other patient management decisions. A negative result may occur with  improper specimen collection/handling, submission of specimen other than nasopharyngeal swab, presence of viral mutation(s) within the areas targeted by this assay, and inadequate  number of viral copies (<131 copies/mL). A negative result must be combined with clinical observations, patient history, and epidemiological information. The expected result is Negative.  Fact Sheet for Patients:  PinkCheek.be  Fact Sheet for Healthcare Providers:  GravelBags.it  This test is no t yet approved or cleared by the [place] FDA and  has been authorized for detection and/or diagnosis of SARS-CoV-2 by FDA under an Emergency Use Authorization (EUA). This EUA will remain  in effect (meaning this test can be used) for the duration of the COVID-19 declaration under Section 564(b)(1) of the Act, 21 U.S.C. section 360bbb-3(b)(1), unless the authorization is terminated or revoked sooner.     Influenza A by PCR NEGATIVE NEGATIVE Final   Influenza B by PCR NEGATIVE NEGATIVE Final    Comment: (NOTE) The Xpert Xpress SARS-CoV-2/FLU/RSV assay is intended as an aid in  the diagnosis of influenza from Nasopharyngeal swab specimens and  should not be used as a sole basis for treatment. Nasal washings and  aspirates are unacceptable for Xpert Xpress SARS-CoV-2/FLU/RSV  testing.  Fact Sheet for Patients: PinkCheek.be  Fact Sheet for Healthcare Providers: GravelBags.it  This test is not yet approved or cleared by the [place] FDA and  has been authorized for detection and/or diagnosis of SARS-CoV-2 by  FDA under an Emergency Use Authorization (EUA). This EUA will remain  in effect (meaning this test can be used) for the duration of the  Covid-19 declaration under Section 564(b)(1) of the Act, 21  U.S.C. section 360bbb-3(b)(1), unless the authorization is  terminated or revoked. Performed at [facility], [place] [address], [place], [place] [address]   Urine culture     Status: Abnormal (Preliminary result)  Collection Time: 05/09/20  3:40 AM    Specimen: In/Out Cath Urine  Result Value Ref Range Status   Specimen Description IN/OUT CATH URINE  Final   Special Requests NONE  Final   Culture (A)  Final    >=100,000 COLONIES/mL PROTEUS MIRABILIS SUSCEPTIBILITIES TO FOLLOW Performed at [facility], [place] [address], [place], [place] [address]    Report Status PENDING  Incomplete    Procedures and diagnostic studies:  MR BRAIN WO CONTRAST  Result Date: 05/09/2020 CLINICAL DATA:  Syncope, simple, abnormal neuro exam. Code stroke with right-sided gaze. The examination had to be discontinued prior to completion due to pain. Axial coronal diffusion weighted images were performed. Sagittal T1 weighted images were performed. EXAM: MRI HEAD WITHOUT CONTRAST TECHNIQUE: Multiplanar, multiecho pulse sequences of the brain and surrounding structures were obtained without intravenous contrast. COMPARISON:  CT head without contrast 05/08/2020. MR head without contrast 01/04/2019 FINDINGS: Brain: The diffusion-weighted images demonstrate no acute or subacute infarction. Moderate generalized atrophy is present. Lateral ventricles and third ventricle are dilated. Fourth ventricle is of normal size. There is thinning of the corpus callosum, stable. No focal abnormality is present. The brainstem and cerebellum are within normal limits. Vascular: Not assessed Skull and upper cervical spine: Distorted by motion Sinuses/Orbits: Sinuses are grossly clear. Orbits are not well assessed by the limited study. IMPRESSION: 1. No acute intracranial abnormality or significant interval change. 2. Moderate generalized atrophy. This likely reflects the sequela of chronic microvascular ischemia. Electronically Signed   By: San Morelle M.D.   On: 05/09/2020 07:00   CT ABDOMEN PELVIS W CONTRAST  Result Date: 05/09/2020 CLINICAL DATA:  Acute abdominal pain. Patient was unresponsive at dinner but now all ir to self. History of blood clots and cancer. EXAM: CT  ABDOMEN AND PELVIS WITH CONTRAST TECHNIQUE: Multidetector CT imaging of the abdomen and pelvis was performed using the standard protocol following bolus administration of intravenous contrast. CONTRAST:  145mL OMNIPAQUE IOHEXOL 300 MG/ML  SOLN COMPARISON:  01/31/2020 FINDINGS: Lower chest: Small right pleural effusion with patchy infiltration or scarring in the right lung base, unchanged since prior study. 6 mm nodule in the left lung base is unchanged since prior study. Volume loss in the right lung. Hepatobiliary: No focal liver abnormality is seen. No gallstones, gallbladder wall thickening, or biliary dilatation. Pancreas: Unremarkable. No pancreatic ductal dilatation or surrounding inflammatory changes. Spleen: Normal in size without focal abnormality. Adrenals/Urinary Tract: Adrenal glands are unremarkable. Kidneys are normal, without renal calculi, focal lesion, or hydronephrosis. Bladder wall is diffusely thickened, possibly due to chronic outlet obstruction or cystitis. Layering debris in the dependent bladder may represent evidence of hemorrhage or infection. Stomach/Bowel: Stomach is fluid-filled without abnormal distention or wall thickening. Small bowel are decompressed. Stool fills the colon with particular prominence of rectal stool. No wall thickening or inflammatory changes are demonstrated. Appendix is normal. Vascular/Lymphatic: Aortic atherosclerosis. No enlarged abdominal or pelvic lymph nodes. Reproductive: Lobular enlargement of the prostate gland, impressing on the bladder base. Other: No free air or free fluid in the abdomen. Abdominal wall musculature appears intact. Musculoskeletal: Postoperative left hip arthroplasty. Degenerative changes in the spine. No destructive bone lesions. IMPRESSION: 1. No acute process demonstrated in the abdomen or pelvis. 2. Small right pleural effusion with patchy infiltration or scarring in the right lung base, unchanged since prior study. 6 mm nodule in the  left lung base is unchanged since prior study. 3. Lobular enlargement of the prostate gland, impressing on the  bladder base. 4. Bladder wall is diffusely thickened, possibly due to chronic outlet obstruction or cystitis. Layering debris in the dependent bladder may represent evidence of hemorrhage or infection. Correlation with urinalysis recommended. 5. Stool fills the colon with particular prominence of rectal stool. No evidence of bowel obstruction. 6. Aortic atherosclerosis. Aortic Atherosclerosis (ICD10-I70.0). Electronically Signed   By: Lucienne Capers M.D.   On: 05/09/2020 00:07   DG Chest Port 1 View  Result Date: 05/09/2020 CLINICAL DATA:  Sepsis. EXAM: PORTABLE CHEST 1 VIEW COMPARISON:  02/16/2020 FINDINGS: There are airspace opacities in the right lower lung zone and right mid lung zone. No pneumothorax. There is elevation of the right hemidiaphragm. Emphysematous changes are noted. There is right-sided volume loss. There is no acute displaced fracture. There is a small right-sided pleural effusion. The heart size appears unremarkable. IMPRESSION: 1. Small right-sided pleural effusion. 2. Again noted are post treatment changes in the right lung zone likely related to the patient's prior history of lung cancer. Electronically Signed   By: Constance Holster M.D.   On: 05/09/2020 01:07   EEG adult  Result Date: 05/09/2020 Lora Havens, MD     05/09/2020  9:56 AM Patient Name: Arville Postlewaite MRN: [id number] Epilepsy Attending: Lora Havens Referring Physician/Provider: Dr. Shela Leff Date: 05/09/2020 Duration: 23.44 mins Patient history: 79 year old male with brief period of sudden unresponsiveness. EEG to evaluate for seizures. Level of alertness: Awake AEDs during EEG study: None Technical aspects: This EEG study was done with scalp electrodes positioned according to the 10-20 International system of electrode placement. Electrical activity was acquired at a sampling rate of 500Hz   and reviewed with a high frequency filter of 70Hz  and a low frequency filter of 1Hz . EEG data were recorded continuously and digitally stored. Description: The posterior dominant rhythm consists of 8 Hz activity of moderate voltage (25-35 uV) seen predominantly in posterior head regions, symmetric and reactive to eye opening and eye closing. EEG showed continuous generalized 5 to 6 Hz theta as well as intermittent generalized 2-3Hz  delta slowing. Hyperventilation and photic stimulation were not performed.   ABNORMALITY -Continuous slow, generalized IMPRESSION: This study is suggestive of mild diffuse encephalopathy, nonspecific etiology. No seizures or epileptiform discharges were seen throughout the recording. Lora Havens   ECHOCARDIOGRAM COMPLETE  Result Date: 05/09/2020    ECHOCARDIOGRAM REPORT   Patient Name:   [facility] Date of Exam: 05/09/2020 Medical Rec #:  [id number]    Height:       75.0 in Accession #:    [id number]   Weight:       160.1 lb Date of Birth:  1940-11-26    BSA:          1.996 m Patient Age:    60 years     BP:           123/74 mmHg Patient Gender: M            HR:           105 bpm. Exam Location:  Inpatient Procedure: 2D Echo Indications:    Syncope 780.2 / R55  History:        Patient has prior history of Echocardiogram examinations, most                 recent 11/05/2019. Risk Factors:Hypertension and Dyslipidemia.                 Squamous cell carcinoma of the lung. History of paroxysmal A  Fib. Acute respiratory failure with hypoxia. Severe sepsis                 secondary to UTI.  Sonographer:    Darlina Sicilian RDCS Referring Phys: [id number] [place]  1. Left ventricular ejection fraction, by estimation, is 55 to 60%. The left ventricle has normal function. The left ventricle has no regional wall motion abnormalities. Left ventricular diastolic parameters are indeterminate.  2. Right ventricular systolic function is normal. The right  ventricular size is normal. There is normal pulmonary artery systolic pressure.  3. The mitral valve is normal in structure. Trivial mitral valve regurgitation. No evidence of mitral stenosis.  4. The aortic valve is grossly normal. Aortic valve regurgitation is not visualized.  5. Pulmonic valve regurgitation not assessed.  6. The inferior vena cava is normal in size with greater than 50% respiratory variability, suggesting right atrial pressure of 3 mmHg. Comparison(s): No significant change from prior study. Conclusion(s)/Recommendation(s): Limited windows--no parasternal or apical views, study done subcostal. FINDINGS  Left Ventricle: Left ventricular ejection fraction, by estimation, is 55 to 60%. The left ventricle has normal function. The left ventricle has no regional wall motion abnormalities. The left ventricular internal cavity size was normal in size. There is  no left ventricular hypertrophy. Left ventricular diastolic parameters are indeterminate. Right Ventricle: The right ventricular size is normal. No increase in right ventricular wall thickness. Right ventricular systolic function is normal. There is normal pulmonary artery systolic pressure. The tricuspid regurgitant velocity is 1.83 m/s, and  with an assumed right atrial pressure of 3 mmHg, the estimated right ventricular systolic pressure is 63.7 mmHg. Left Atrium: Left atrial size was normal in size. Right Atrium: Right atrial size was normal in size. Pericardium: There is no evidence of pericardial effusion. Mitral Valve: The mitral valve is normal in structure. Trivial mitral valve regurgitation. No evidence of mitral valve stenosis. Tricuspid Valve: The tricuspid valve is normal in structure. Tricuspid valve regurgitation is trivial. No evidence of tricuspid stenosis. Aortic Valve: The aortic valve is grossly normal. Aortic valve regurgitation is not visualized. Pulmonic Valve: The pulmonic valve was not well visualized. Pulmonic valve  regurgitation not assessed. Aorta: The aortic root was not well visualized and the ascending aorta was not well visualized. Venous: The inferior vena cava is normal in size with greater than 50% respiratory variability, suggesting right atrial pressure of 3 mmHg. IAS/Shunts: The atrial septum is grossly normal.  LEFT VENTRICLE PLAX 2D LVIDd:         3.45 cm     Diastology LVIDs:         2.70 cm     LV e' medial:    6.20 cm/s LV PW:         0.90 cm     LV E/e' medial:  9.0 LV IVS:        1.10 cm     LV e' lateral:   5.33 cm/s LVOT diam:     2.20 cm     LV E/e' lateral: 10.5 LV SV:         45 LV SV Index:   23 LVOT Area:     3.80 cm  LV Volumes (MOD) LV vol d, MOD A2C: 77.6 ml LV vol d, MOD A4C: 85.1 ml LV vol s, MOD A2C: 26.9 ml LV vol s, MOD A4C: 55.4 ml LV SV MOD A2C:     50.7 ml LV SV MOD A4C:     85.1 ml  LV SV MOD BP:      43.7 ml RIGHT VENTRICLE RV S prime:     7.62 cm/s LEFT ATRIUM             Index       RIGHT ATRIUM           Index LA diam:        2.10 cm 1.05 cm/m  RA Area:     10.20 cm LA Vol (A2C):   21.1 ml 10.57 ml/m RA Volume:   21.90 ml  10.97 ml/m LA Vol (A4C):   30.6 ml 15.33 ml/m LA Biplane Vol: 26.1 ml 13.08 ml/m  AORTIC VALVE LVOT Vmax:   64.60 cm/s LVOT Vmean:  49.900 cm/s LVOT VTI:    0.119 m  AORTA Ao Root diam: 3.20 cm MITRAL VALVE               TRICUSPID VALVE MV Area (PHT): 7.99 cm    TR Peak grad:   13.4 mmHg MV Decel Time: 95 msec     TR Vmax:        183.00 cm/s MV E velocity: 55.70 cm/s MV A velocity: 77.60 cm/s  SHUNTS MV E/A ratio:  0.72        Systemic VTI:  0.12 m                            Systemic Diam: 2.20 cm Buford Dresser MD Electronically signed by Buford Dresser MD Signature Date/Time: 05/09/2020/5:27:30 PM    Final    DG Hip Unilat W or Wo Pelvis 2-3 Views Right  Result Date: 05/09/2020 CLINICAL DATA:  Pain EXAM: DG HIP (WITH OR WITHOUT PELVIS) 2-3V RIGHT COMPARISON:  CT from same day FINDINGS: There are advanced degenerative changes of the right  hip without evidence for an acute displaced fracture or dislocation. The patient is status post total hip arthroplasty on the left. IMPRESSION: Advanced degenerative changes of the right hip without evidence for an acute displaced fracture or dislocation. Electronically Signed   By: Constance Holster M.D.   On: 05/09/2020 01:10   CT HEAD CODE STROKE WO CONTRAST  Result Date: 05/08/2020 CLINICAL DATA:  Code stroke.  Right facial droop EXAM: CT HEAD WITHOUT CONTRAST TECHNIQUE: Contiguous axial images were obtained from the base of the skull through the vertex without intravenous contrast. COMPARISON:  11/04/2019 FINDINGS: Brain: There is no mass, hemorrhage or extra-axial collection. Severe ventriculomegaly, unchanged. There is hypoattenuation of the periventricular white matter, most commonly indicating chronic ischemic microangiopathy. Old right occipital infarct. Vascular: No abnormal hyperdensity of the major intracranial arteries or dural venous sinuses. No intracranial atherosclerosis. Skull: The visualized skull base, calvarium and extracranial soft tissues are normal. Sinuses/Orbits: No fluid levels or advanced mucosal thickening of the visualized paranasal sinuses. No mastoid or middle ear effusion. The orbits are normal. ASPECTS [facility] Stroke Program Early CT Score) - Ganglionic level infarction (caudate, lentiform nuclei, internal capsule, insula, M1-M3 cortex): 7 - Supraganglionic infarction (M4-M6 cortex): 3 Total score (0-10 with 10 being normal): 10 IMPRESSION: 1. No acute intracranial abnormality. 2. ASPECTS is 10. 3. 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 These results were communicated to Dr. Roland Rack at 9:57 pm on 05/08/2020 by text page via the [facility] messaging system. Electronically Signed   By: Ulyses Jarred M.D.   On: 05/08/2020 21:57    Medications:   . rivaroxaban  20 mg Oral Q supper  .  sodium chloride flush  3 mL Intravenous Once    Continuous Infusions: . cefTRIAXone (ROCEPHIN)  IV       LOS: 1 day   Geradine Girt  [facility]   How to contact the [facility] Attending or Consulting provider [place] or covering provider during after hours [place], for this patient?  1. Check the care team in [facility] and look for a) attending/consulting [facility] provider listed and b) the [facility] team listed 2. Log into [email or web address] and use [place]'s universal password to access. If you do not have the password, please contact the hospital operator. 3. Locate the [facility] provider you are looking for under [facility] and page to a number that you can be directly reached. 4. If you still have difficulty reaching the provider, please page the [facility] (Director on Call) for the Hospitalists listed on amion for assistance.  05/10/2020, 2:31 PM

## 2020-05-11 LAB — URINE CULTURE: Culture: >=100000 — AB

## 2020-05-11 MED ORDER — ADULT MULTIVITAMIN W/MINERALS CH
1.0000 | ORAL_TABLET | Freq: Every day | ORAL | Status: DC
  Administered 2020-05-11 – 2020-05-18 (×8): 1 via ORAL
  Filled 2020-05-11 (×8): qty 1

## 2020-05-11 MED ORDER — TAMSULOSIN HCL 0.4 MG PO CAPS
0.4000 mg | ORAL_CAPSULE | Freq: Every day | ORAL | Status: DC
  Administered 2020-05-11 – 2020-05-18 (×8): 0.4 mg via ORAL
  Filled 2020-05-11 (×8): qty 1

## 2020-05-11 MED ORDER — ENSURE ENLIVE PO LIQD
237.0000 mL | Freq: Three times a day (TID) | ORAL | Status: DC
  Administered 2020-05-11 – 2020-05-18 (×21): 237 mL via ORAL

## 2020-05-11 NOTE — [place] FL2 (Signed)
[place] MEDICAID FL2 LEVEL OF CARE SCREENING TOOL     IDENTIFICATION  Patient Name: Richard [name] Birthdate: [birth year] Sex: male Admission Date (Current Location): 05/08/2020  [facility] and [place] Number:  [name] and Address:  [facility], [place] [address], [place], [place] [address]      Provider Number: [id number]  Attending Physician Name and Address:  Geradine Girt, DO  Relative Name and Phone Number:  Cashton Hosley [phone]    Current Level of Care: Hospital Recommended Level of Care: [place] Prior Approval Number:    Date Approved/Denied:   [name]: [id number]  Discharge Plan: SNF    Current Diagnoses: Patient Active Problem List   Diagnosis Date Noted  . Severe sepsis ([place]) 05/09/2020  . Altered level of consciousness 05/09/2020  . Hypothermia 05/09/2020  . UTI (urinary tract infection) 05/09/2020  . Acute respiratory disease due to COVID-19 virus 02/17/2020  . Acute respiratory failure due to COVID-19 ([place]) 02/17/2020  . Syncope 11/04/2019  . Fall at home, initial encounter 11/04/2019  . Anemia 09/25/2019  . Symptomatic anemia 09/24/2019  . Paroxysmal atrial fibrillation ([place]) 07/06/2019  . Pressure injury of skin 05/21/2019  . Anorexia 05/20/2019  . HCAP (healthcare-associated pneumonia) 05/18/2019  . Protein-calorie malnutrition, severe ([place]) 05/18/2019  . Sepsis ([place]) 05/08/2019  . History of pulmonary embolism 03/30/2019  . Pure hypercholesterolemia 03/30/2019  . SOB (shortness of breath) 03/30/2019  . Palliative care by specialist   . DNR (do not resuscitate) discussion   . Poor appetite 01/05/2019  . Goals of care, counseling/discussion 12/28/2018  . Encounter for antineoplastic chemotherapy 12/28/2018  . Stage III squamous cell carcinoma of right lung ([place]) 12/28/2018  . Dementia without behavioral disturbance ([place]) 12/16/2018  . Lung mass 12/15/2018  . Pelvic mass in male  12/15/2018  . Postobstructive pneumonia 12/15/2018  . Hypertension     Orientation RESPIRATION BLADDER Height & Weight     Self, Time  Normal Incontinent, External catheter Weight: 153 lb 14.4 oz (69.8 kg) Height:  6\' 3"  (190.5 cm)  BEHAVIORAL SYMPTOMS/MOOD NEUROLOGICAL BOWEL NUTRITION STATUS      Incontinent Diet (See dc summary)  AMBULATORY STATUS COMMUNICATION OF NEEDS Skin   Extensive Assist Verbally Normal                       Personal Care Assistance Level of Assistance  Bathing, Feeding, Dressing Bathing Assistance: Limited assistance Feeding assistance: Independent Dressing Assistance: Limited assistance     Functional Limitations Info  Sight, Hearing, Speech Sight Info: Adequate Hearing Info: Adequate Speech Info: Adequate    SPECIAL CARE FACTORS FREQUENCY  PT (By licensed PT), OT (By licensed OT)     PT Frequency: 5x week OT Frequency: 5x week            Contractures Contractures Info: Not present    Additional Factors Info  Code Status, Allergies Code Status Info: Full Allergies Info: NKA           Current Medications (05/11/2020):  This is the current hospital active medication list Current Facility-Administered Medications  Medication Dose Route Frequency Provider Last Rate Last Admin  . acetaminophen (TYLENOL) tablet 650 mg  650 mg Oral Q6H PRN Shela Leff, MD       Or  . acetaminophen (TYLENOL) suppository 650 mg  650 mg Rectal Q6H PRN Shela Leff, MD      . bisacodyl (DULCOLAX) suppository 10 mg  10 mg  Rectal Daily PRN Geradine Girt, DO      . cefTRIAXone (ROCEPHIN) 1 g in sodium chloride 0.9 % 100 mL IVPB  1 g Intravenous Q24H Vann, Jessica U, DO 200 mL/hr at 05/11/20 1439 1 g at 05/11/20 1439  . feeding supplement (ENSURE ENLIVE / ENSURE PLUS) liquid 237 mL  237 mL Oral TID BM Vann, Jessica U, DO      . multivitamin with minerals tablet 1 tablet  1 tablet Oral Daily Vann, Jessica U, DO      . polyethylene glycol  (MIRALAX / GLYCOLAX) packet 17 g  17 g Oral Daily PRN Vann, Jessica U, DO      . rivaroxaban (XARELTO) tablet 20 mg  20 mg Oral Q supper Vann, Jessica U, DO   20 mg at 05/10/20 1721  . sodium chloride flush (NS) 0.9 % injection 3 mL  3 mL Intravenous Once Gareth Morgan, MD      . tamsulosin (FLOMAX) capsule 0.4 mg  0.4 mg Oral QPC supper Geradine Girt, DO         Discharge Medications: Please see discharge summary for a list of discharge medications.  Relevant Imaging Results:  Relevant Lab Results:   Additional Information SSN [id number]  Loreta Ave, LCSWA

## 2020-05-11 NOTE — TOC Initial Note (Signed)
Transition of Care [facility]) - Initial/Assessment Note    Patient Details  Name: Richard [name] MRN: [id number] Date of Birth: [birth year]  Transition of [facility]) CM/SW Contact:    Loreta Ave, [place] Phone Number: 05/11/2020, 3:51 PM  Clinical Narrative:                 CSW spoke with pt's daughter [name] in ref to [employer] plans for pt. [name] stated pt needs to go to SNF to regain strength. [name] stated pt was previously at [facility] and does not want him to go back due to poor treatment. [name] states she does not have transportation so she would prefer SNF to be near her side of town. [name] reports preference for [facility]. Pt has received covid vaccines. CSW discussed insurance auth process and medicare.gov ratings list. CSW will continue to follow.         Patient Goals and CMS Choice        Expected Discharge Plan and Services                                                Prior Living Arrangements/Services                       Activities of Daily Living      Permission Sought/Granted                  Emotional Assessment              Admission diagnosis:  UTI (urinary tract infection) [N39.0] Generalized weakness [R53.1] Hypothermia, initial encounter [T68.XXXA] Severe sepsis ([place]) [A41.9, R65.20] Hypotension, unspecified hypotension type [I95.9] Syncope, unspecified syncope type [R55] Sepsis, due to unspecified organism, unspecified whether acute organ dysfunction present [facility]) [A41.9] Patient Active Problem List   Diagnosis Date Noted  . Severe sepsis ([place]) 05/09/2020  . Altered level of consciousness 05/09/2020  . Hypothermia 05/09/2020  . UTI (urinary tract infection) 05/09/2020  . Acute respiratory disease due to COVID-19 virus 02/17/2020  . Acute respiratory failure due to COVID-19 ([place]) 02/17/2020  . Syncope 11/04/2019  . Fall at home, initial encounter 11/04/2019  . Anemia 09/25/2019  . Symptomatic  anemia 09/24/2019  . Paroxysmal atrial fibrillation ([place]) 07/06/2019  . Pressure injury of skin 05/21/2019  . Anorexia 05/20/2019  . HCAP (healthcare-associated pneumonia) 05/18/2019  . Protein-calorie malnutrition, severe ([place]) 05/18/2019  . Sepsis ([place]) 05/08/2019  . History of pulmonary embolism 03/30/2019  . Pure hypercholesterolemia 03/30/2019  . SOB (shortness of breath) 03/30/2019  . Palliative care by specialist   . DNR (do not resuscitate) discussion   . Poor appetite 01/05/2019  . Goals of care, counseling/discussion 12/28/2018  . Encounter for antineoplastic chemotherapy 12/28/2018  . Stage III squamous cell carcinoma of right lung ([place]) 12/28/2018  . Dementia without behavioral disturbance ([place]) 12/16/2018  . Lung mass 12/15/2018  . Pelvic mass in male 12/15/2018  . Postobstructive pneumonia 12/15/2018  . Hypertension    PCP:  [name], NP Pharmacy:   [place], [place] - [address] [place] [address] Phone: [phone] Fax: [phone]  [place]), [place] - [address] [place] [address] Phone: [phone] Fax: [facility]  [place], [place] [address] Phone: [phone] Fax: [phone]     Social Determinants of Health (SDOH) Interventions    Readmission Risk Interventions Readmission Risk Prevention Plan 05/30/2019  Transportation Screening Complete  Medication Review ([place]) Complete  PCP or Specialist appointment within 3-5 days of discharge Complete  HRI or [place] Complete  SW Recovery Care/Counseling Consult Complete  [place] Not Applicable  Some recent data might be hidden

## 2020-05-11 NOTE — Progress Notes (Signed)
Progress Note    Alee Katen  BBC:[id number] DOB: 04/30/1941  DOA: 05/08/2020 PCP: [name], NP    Brief Narrative:     Medical records reviewed and are as summarized below:  Boysie Mccamy is an 79 y.o. 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 Julien Nordmann, paroxysmal A. fib and PE on Xarelto, hypertension, hyperlipidemia, COVID-19 infection in September this year presented to the ED via EMS as code stroke for evaluation of unresponsiveness. Reportedly patient was in his normal state of health and became suddenly unresponsive at 1700 while having dinner with family. When EMS arrived they noticed that he had right gaze deviation, slurred speech, and was hypotensive with blood pressure in the 80s. Patient was given IV fluids and blood pressure improved.    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 Tachycardic and tachypneic. Hypotensive with systolic in the low 50T. Lactic acid elevated at 4.0. SARS-CoV-2 PCR test and influenza panel negative. Chest x-ray not suggestive of pneumonia.  -u/a suggestive of UTI -culture with proteus -narrow abx to rocephin  Transient altered level of consciousness:  -per neurology: With patients with dementia, spell duration is not always as reliable. Certainly, with his previous stroke, this could explain focal deficits in the setting of hypoperfusion, but could also support a todd's phenomenon. Given the global nature, I think that stroke/tia much less likely.Workup so far with MRI brain is neg for acute stroke and no obvious abnormality that could be a seizure focus. EEG neg for  seizure activity. + for mild diffuse encephalopathy. -PT eval  Hypothermia: Suspect due to severe sepsis. Placed on Bair hugger in the ED and temperature now improved.  Stage III squamous cell carcinoma of the lung: Followed by Dr. Julien Nordmann from oncology.  Paroxysmal A. Fib:  -Resume Xarelto   History of PE -Resume Xarelto- will need to ensure not a fall risk and he can afford  Hypertension -Hold antihypertensives given severe sepsis/hypotension   Family Communication/Anticipated D/C date and plan/Code Status   DVT prophylaxis: xarelto Code Status: Full Code.  Disposition Plan: Status is: Inpatient  Remains inpatient appropriate because:Inpatient level of care appropriate due to severity of illness   Dispo: The patient is from: Home              Anticipated d/c is to: tbd              Anticipated d/c date is: 2 days              Patient currently is not medically stable to d/c. needs PT eval         Medical Consultants:    neurology  Subjective:   No current complaints  Objective:    Vitals:   05/10/20 2157 05/11/20 0102 05/11/20 0712 05/11/20 0753  BP: (!) 109/52  119/69 124/75  Pulse: (!) 102  95 (!) 101  Resp: 20  18 19   Temp: 98.7 F (37.1 C)  98.6 F (37 C) 97.7 F (36.5 C)  TempSrc: Oral  Oral Oral  SpO2: 98%  97% 96%  Weight:  69.8 kg    Height:  Intake/Output Summary (Last 24 hours) at 05/11/2020 1123 Last data filed at 05/11/2020 0700 Gross per 24 hour  Intake 580 ml  Output 400 ml  Net 180 ml   Filed Weights   05/08/20 2306 05/11/20 0102  Weight: 72.6 kg 69.8 kg    Exam:  General: Appearance:    Thin male in no acute distress     Lungs:     respirations unlabored  Heart:    Tachycardic.No murmurs, rubs, or gallops.   MS:   All extremities are intact.   Neurologic:   Awake, alert, pleasant and cooperative   Data Reviewed:   I have personally reviewed following labs and imaging studies:  Labs: Labs show the  following:   Basic Metabolic Panel: Recent Labs  Lab 05/08/20 2151 05/08/20 2151 05/08/20 2230 05/08/20 2230 05/09/20 0958 05/10/20 0242  NA 143  --  142  --  143 142  K 4.3   < > 3.9   < > 3.9 3.7  CL 111  --  106  --  109 109  CO2  --   --  22  --  25 22  GLUCOSE 150*  --  159*  --  95 80  BUN 27*  --  18  --  14 12  CREATININE 0.90  --  1.19  --  0.98 0.95  CALCIUM  --   --  10.5*  --  9.2 9.2   < > = values in this interval not displayed.   GFR Estimated Creatinine Clearance: 64.3 mL/min (by C-G formula based on SCr of 0.95 mg/dL). Liver Function Tests: Recent Labs  Lab 05/08/20 2230  AST 37  ALT 30  ALKPHOS 103  BILITOT 0.6  PROT 7.8  ALBUMIN 4.0   No results for input(s): LIPASE, AMYLASE in the last 168 hours. No results for input(s): AMMONIA in the last 168 hours. Coagulation profile Recent Labs  Lab 05/08/20 2230  INR 1.2    CBC: Recent Labs  Lab 05/08/20 2151 05/08/20 2230 05/09/20 0958 05/10/20 0242  WBC  --  8.7 6.3 5.1  NEUTROABS  --  5.8  --   --   HGB 14.6 13.7 9.6* 9.7*  HCT 43.0 43.9 29.7* 29.7*  MCV  --  66.2* 65.6* 63.9*  PLT  --  409* 298 295   Cardiac Enzymes: No results for input(s): CKTOTAL, CKMB, CKMBINDEX, TROPONINI in the last 168 hours. BNP (last 3 results) No results for input(s): PROBNP in the last 8760 hours. CBG: Recent Labs  Lab 05/08/20 2140  GLUCAP 95   D-Dimer: Recent Labs    05/09/20 0647  DDIMER 1.17*   Hgb A1c: No results for input(s): HGBA1C in the last 72 hours. Lipid Profile: No results for input(s): CHOL, HDL, LDLCALC, TRIG, CHOLHDL, LDLDIRECT in the last 72 hours. Thyroid function studies: Recent Labs    05/09/20 0519  TSH 1.103   Anemia work up: Recent Labs    05/10/20 0808  FERRITIN 17*  TIBC 468*  IRON 51   Sepsis Labs: Recent Labs  Lab 05/08/20 2230 05/09/20 0110 05/09/20 0958 05/10/20 0242  WBC 8.7  --  6.3 5.1  LATICACIDVEN 4.0* 2.0*  --   --     Microbiology Recent  Results (from the past 240 hour(s))  Blood Culture (routine x 2)     Status: None (Preliminary result)   Collection Time: 05/08/20 10:30 PM   Specimen: BLOOD  Result Value Ref Range Status   Specimen Description  BLOOD RIGHT ANTECUBITAL  Final   Special Requests   Final    BOTTLES DRAWN AEROBIC AND ANAEROBIC Blood Culture adequate volume   Culture   Final    NO GROWTH 1 DAY Performed at [facility], [address] [place], [place] [address]    Report Status PENDING  Incomplete  Blood Culture (routine x 2)     Status: None (Preliminary result)   Collection Time: 05/08/20 10:34 PM   Specimen: BLOOD LEFT FOREARM  Result Value Ref Range Status   Specimen Description BLOOD LEFT FOREARM  Final   Special Requests   Final    BOTTLES DRAWN AEROBIC AND ANAEROBIC Blood Culture adequate volume   Culture   Final    NO GROWTH 1 DAY Performed at [facility], [place] [address], [place], [place] [address]    Report Status PENDING  Incomplete  Respiratory Panel by RT PCR (Flu A&B, Covid) - Nasopharyngeal Swab     Status: None   Collection Time: 05/09/20  1:38 AM   Specimen: Nasopharyngeal Swab; Nasopharyngeal(NP) swabs in vial transport medium  Result Value Ref Range Status   SARS Coronavirus 2 by RT PCR NEGATIVE NEGATIVE Final    Comment: (NOTE) SARS-CoV-2 target nucleic acids are NOT DETECTED.  The SARS-CoV-2 RNA is generally detectable in upper respiratoy specimens during the acute phase of infection. The lowest concentration of SARS-CoV-2 viral copies this assay can detect is 131 copies/mL. A negative result does not preclude SARS-Cov-2 infection and should not be used as the sole basis for treatment or other patient management decisions. A negative result may occur with  improper specimen collection/handling, submission of specimen other than nasopharyngeal swab, presence of viral mutation(s) within the areas targeted by this assay, and inadequate number of viral copies (<131  copies/mL). A negative result must be combined with clinical observations, patient history, and epidemiological information. The expected result is Negative.  Fact Sheet for Patients:  PinkCheek.be  Fact Sheet for Healthcare Providers:  GravelBags.it  This test is no t yet approved or cleared by the [place] FDA and  has been authorized for detection and/or diagnosis of SARS-CoV-2 by FDA under an Emergency Use Authorization (EUA). This EUA will remain  in effect (meaning this test can be used) for the duration of the COVID-19 declaration under Section 564(b)(1) of the Act, 21 U.S.C. section 360bbb-3(b)(1), unless the authorization is terminated or revoked sooner.     Influenza A by PCR NEGATIVE NEGATIVE Final   Influenza B by PCR NEGATIVE NEGATIVE Final    Comment: (NOTE) The Xpert Xpress SARS-CoV-2/FLU/RSV assay is intended as an aid in  the diagnosis of influenza from Nasopharyngeal swab specimens and  should not be used as a sole basis for treatment. Nasal washings and  aspirates are unacceptable for Xpert Xpress SARS-CoV-2/FLU/RSV  testing.  Fact Sheet for Patients: PinkCheek.be  Fact Sheet for Healthcare Providers: GravelBags.it  This test is not yet approved or cleared by the [place] FDA and  has been authorized for detection and/or diagnosis of SARS-CoV-2 by  FDA under an Emergency Use Authorization (EUA). This EUA will remain  in effect (meaning this test can be used) for the duration of the  Covid-19 declaration under Section 564(b)(1) of the Act, 21  U.S.C. section 360bbb-3(b)(1), unless the authorization is  terminated or revoked. Performed at [facility], [place] [address], [place], [place] [address]   Urine culture     Status: Abnormal   Collection  Time: 05/09/20  3:40 AM   Specimen: In/Out Cath Urine  Result Value Ref Range  Status   Specimen Description IN/OUT CATH URINE  Final   Special Requests   Final    NONE Performed at [facility], [address], [place], [place] [address]    Culture >=100,000 COLONIES/mL PROTEUS MIRABILIS (A)  Final   Report Status 05/11/2020 FINAL  Final   Organism ID, Bacteria PROTEUS MIRABILIS (A)  Final      Susceptibility   Proteus mirabilis - MIC*    AMPICILLIN <=2 SENSITIVE Sensitive     CEFAZOLIN <=4 SENSITIVE Sensitive     CEFEPIME <=0.12 SENSITIVE Sensitive     CEFTRIAXONE <=0.25 SENSITIVE Sensitive     CIPROFLOXACIN <=0.25 SENSITIVE Sensitive     GENTAMICIN <=1 SENSITIVE Sensitive     IMIPENEM 0.5 SENSITIVE Sensitive     NITROFURANTOIN 128 RESISTANT Resistant     TRIMETH/SULFA <=20 SENSITIVE Sensitive     AMPICILLIN/SULBACTAM <=2 SENSITIVE Sensitive     PIP/TAZO <=4 SENSITIVE Sensitive     * >=100,000 COLONIES/mL PROTEUS MIRABILIS    Procedures and diagnostic studies:  No results found.  Medications:   . rivaroxaban  20 mg Oral Q supper  . sodium chloride flush  3 mL Intravenous Once   Continuous Infusions: . cefTRIAXone (ROCEPHIN)  IV 1 g (05/10/20 1443)     LOS: 2 days   Geradine Girt  [facility]   How to contact the [facility] Attending or Consulting provider [place] or covering provider during after hours [place], for this patient?  1. Check the care team in [facility] and look for a) attending/consulting TRH provider listed and b) the [facility] team listed 2. Log into [email or web address] and use [place]'s universal password to access. If you do not have the password, please contact the hospital operator. 3. Locate the [facility] provider you are looking for under [facility] and page to a number that you can be directly reached. 4. If you still have difficulty reaching the provider, please page the [facility] (Director on Call) for the Hospitalists listed on amion for assistance.  05/11/2020, 11:23 AM

## 2020-05-11 NOTE — Plan of Care (Signed)
  Problem: Elimination: Goal: Will not experience complications related to urinary retention Outcome: Completed/Met   Problem: Pain Managment: Goal: General experience of comfort will improve Outcome: Completed/Met

## 2020-05-11 NOTE — Progress Notes (Signed)
Initial Nutrition Assessment  DOCUMENTATION CODES:   Severe malnutrition in context of chronic illness  INTERVENTION:   -Ensure Enlive po TID, each supplement provides 350 kcal and 20 grams of protein -MVI with minerals daily  NUTRITION DIAGNOSIS:   Severe Malnutrition related to chronic illness (dementia) as evidenced by moderate muscle depletion, severe muscle depletion, moderate fat depletion, severe fat depletion.  GOAL:   Patient will meet greater than or equal to 90% of their needs  MONITOR:   PO intake, Supplement acceptance, Labs, Weight trends, Skin, I & O's  REASON FOR ASSESSMENT:   Low [place]    ASSESSMENT:   Richard [name] is a 79 y.o. 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 Julien Nordmann, paroxysmal A. fib and PE on Xarelto, hypertension, hyperlipidemia, COVID-19 infection in September this year presented to the ED via EMS as code stroke for evaluation of unresponsiveness.  Pt admitted with severe sepsis.   Reviewed I/O's: +280 ml x 24 hours and +2.2 L since admission  UOP: 900 ml x 24 hours  Spoke with pt at bedside, who was pleasant and in good spirits at time of visit. Pt replied "Call me Richard [name]!" when RD introduced self to pt. He reports he has a great appetite and has been consuming 100% of his meals. Per meal completion records, intake is variable; noted 15-100% (averaging around 50% of meals). Pt without teeth, however, denies any difficulty chewing or swallowing foods. Per his report, he consumed 2 meals per day PTA, which are prepared by family members.   Pt is unsure if he has lost weight. Reviewed wt hx; wt has been stable over the past year.   Discussed with pt importance of good meal and supplement intake to promote healing.   Labs reviewed.   NUTRITION - FOCUSED PHYSICAL EXAM:    Most Recent Value  Orbital Region Moderate depletion  Upper Arm Region Severe depletion  Thoracic  and Lumbar Region Moderate depletion  Buccal Region Severe depletion  Temple Region Severe depletion  Clavicle Bone Region Severe depletion  Clavicle and Acromion Bone Region Severe depletion  Scapular Bone Region Severe depletion  Dorsal Hand Severe depletion  Patellar Region Severe depletion  Anterior Thigh Region Severe depletion  Posterior Calf Region Severe depletion  Edema (RD Assessment) None  Hair Reviewed  Eyes Reviewed  Mouth Reviewed  Skin Reviewed  Nails Reviewed       Diet Order:   Diet Order            Diet Heart Room service appropriate? Yes; Fluid consistency: Thin  Diet effective now                 EDUCATION NEEDS:   Education needs have been addressed  Skin:  Skin Assessment: Reviewed RN Assessment  Last BM:  05/09/20  Height:   Ht Readings from Last 1 Encounters:  05/08/20 6\' 3"  (1.905 m)    Weight:   Wt Readings from Last 1 Encounters:  05/11/20 69.8 kg    Ideal Body Weight:  89.1 kg  BMI:  Body mass index is 19.24 kg/m.  Estimated Nutritional Needs:   Kcal:  2100-2300  Protein:  105-120 grams  Fluid:  > 2 L    Loistine Chance, RD, LDN, [place] Registered Dietitian II Certified Diabetes Care and Education Specialist Please refer to [facility] for RD and/or RD on-call/weekend/after hours pager

## 2020-05-12 LAB — CBC
HCT: 31.5 % — ABNORMAL LOW (ref 39.0–52.0)
Hemoglobin: 10.4 g/dL — ABNORMAL LOW (ref 13.0–17.0)
MCH: 20.8 pg — ABNORMAL LOW (ref 26.0–34.0)
MCHC: 33 g/dL (ref 30.0–36.0)
MCV: 62.9 fL — ABNORMAL LOW (ref 80.0–100.0)
Platelets: 309 10*3/uL (ref 150–400)
RBC: 5.01 MIL/uL (ref 4.22–5.81)
RDW: 16.2 % — ABNORMAL HIGH (ref 11.5–15.5)
WBC: 5.5 10*3/uL (ref 4.0–10.5)
nRBC: 0 % (ref 0.0–0.2)

## 2020-05-12 LAB — BASIC METABOLIC PANEL
Anion gap: 9 (ref 5–15)
BUN: 12 mg/dL (ref 8–23)
CO2: 22 mmol/L (ref 22–32)
Calcium: 9.4 mg/dL (ref 8.9–10.3)
Chloride: 106 mmol/L (ref 98–111)
Creatinine, Ser: 0.73 mg/dL (ref 0.61–1.24)
GFR, Estimated: >60 mL/min (ref >60)
Glucose, Bld: 93 mg/dL (ref 70–99)
Potassium: 4.5 mmol/L (ref 3.5–5.1)
Sodium: 137 mmol/L (ref 135–145)

## 2020-05-12 NOTE — TOC Progression Note (Signed)
Transition of Care [facility]) - Progression Note    Patient Details  Name: Richard [name] MRN: [id number] Date of Birth: Mar 07, 1941  Transition of Care [facility]) CM/SW [place], [place] Phone Number: [phone] 05/12/2020, 10:45 AM  Clinical Narrative:     CSW spoke with patient's daughter Richard [name] and provided her the one bed offer from [facility]. CSW explained that preference declined due to insurance being out of network. Richard [name] stated that she wanted to research facility and would call CSW back. CSW informed Richard [name] that fewer facilities accept [employer].  TOC team will continue to assist with discharge planning needs.   Expected Discharge Plan: [place] Barriers to Discharge: Continued Medical Work up, Ship broker  Expected Discharge Plan and Services Expected Discharge Plan: [place] In-house Referral: Clinical Social Work     Living arrangements for the past 2 months: Single Family Home                                       Social Determinants of Health (SDOH) Interventions    Readmission Risk Interventions Readmission Risk Prevention Plan 05/30/2019  Transportation Screening Complete  Medication Review Press photographer) Complete  PCP or Specialist appointment within 3-5 days of discharge Complete  HRI or [place] Complete  SW Recovery Care/Counseling Consult Complete  Uncertain Not Applicable  Some recent data might be hidden

## 2020-05-12 NOTE — Progress Notes (Signed)
Progress Note    Richard [name]  UXL:[id number] DOB: 1941/04/07  DOA: 05/08/2020 PCP: [name], NP    Brief Narrative:     Medical records reviewed and are as summarized below:  Richard [name] is an 79 y.o. 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 Julien Nordmann, paroxysmal A. fib and PE on Xarelto, hypertension, hyperlipidemia, COVID-19 infection in September this year presented to the ED via EMS as code stroke for evaluation of unresponsiveness. Reportedly patient was in his normal state of health and became suddenly unresponsive at 1700 while having dinner with family. When EMS arrived they noticed that he had right gaze deviation, slurred speech, and was hypotensive with blood pressure in the 80s. Patient was given IV fluids and blood pressure improved.    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 Tachycardic and tachypneic. Hypotensive with systolic in the low 53G. Lactic acid elevated at 4.0. SARS-CoV-2 PCR test and influenza panel negative. Chest x-ray not suggestive of pneumonia.  -u/a suggestive of UTI -culture with proteus -narrow abx to rocephin x 5 days  Transient altered level of consciousness:  -per neurology: With patients with dementia, spell duration is not always as reliable. Certainly, with his previous stroke, this could explain focal deficits in the setting of hypoperfusion, but could also support a todd's phenomenon. Given the global nature, I think that stroke/tia much less likely.Workup so far with MRI brain is neg for acute stroke and no obvious abnormality that could be a seizure focus. EEG  neg for seizure activity. + for mild diffuse encephalopathy. -PT eval  Hypothermia: Suspect due to severe sepsis. Placed on Bair hugger in the ED and temperature now improved.  Stage III squamous cell carcinoma of the lung: Followed by Dr. Julien Nordmann from oncology.  Paroxysmal A. Fib:  -Resume Xarelto   History of PE -Resume Xarelto- will need to ensure not a fall risk and he can afford  Hypertension -Hold antihypertensives given severe sepsis/hypotension   Family Communication/Anticipated D/C date and plan/Code Status   DVT prophylaxis: xarelto Code Status: Full Code.  Disposition Plan: Status is: Inpatient  Remains inpatient appropriate because:Inpatient level of care appropriate due to severity of illness   Dispo: The patient is from: Home              Anticipated d/c is to: tbd              Anticipated d/c date is: 2 days              Patient currently is not medically stable to d/c. 5 days of IV abx and then SNF placment         Medical Consultants:    neurology  Subjective:   No overnight events  Objective:    Vitals:   05/11/20 1207 05/11/20 1555 05/11/20 2020 05/12/20 0946  BP: 128/81 (!) 107/54 (!) 143/70 (!) 108/59  Pulse: (!) 103 93 85 100  Resp: 18 18 18 20   Temp: (!) 97.5 F (36.4 C) 97.9 F (36.6 C) 97.7 F (36.5 C) (!) 97 F (36.1 C)  TempSrc: Oral Oral Oral Oral  SpO2:  97% 99% 100% 99%  Weight:      Height:        Intake/Output Summary (Last 24 hours) at 05/12/2020 1209 Last data filed at 05/12/2020 1000 Gross per 24 hour  Intake 1517 ml  Output 900 ml  Net 617 ml   Filed Weights   05/08/20 2306 05/11/20 0102  Weight: 72.6 kg 69.8 kg    Exam:   General: Appearance:    Thin male in no acute distress     Lungs:     respirations unlabored  Heart:    Tachycardic.  MS:   All extremities are intact.   Neurologic:   Awake, alert, oriented x 3. No apparent focal neurological           defect.                         Data Reviewed:   I have personally reviewed following labs and imaging studies:  Labs: Labs show the following:   Basic Metabolic Panel: Recent Labs  Lab 05/08/20 2151 05/08/20 2151 05/08/20 2230 05/08/20 2230 05/09/20 0958 05/09/20 0958 05/10/20 0242 05/12/20 0506  NA 143  --  142  --  143  --  142 137  K 4.3   < > 3.9   < > 3.9   < > 3.7 4.5  CL 111  --  106  --  109  --  109 106  CO2  --   --  22  --  25  --  22 22  GLUCOSE 150*  --  159*  --  95  --  80 93  BUN 27*  --  18  --  14  --  12 12  CREATININE 0.90  --  1.19  --  0.98  --  0.95 0.73  CALCIUM  --   --  10.5*  --  9.2  --  9.2 9.4   < > = values in this interval not displayed.   GFR Estimated Creatinine Clearance: 76.3 mL/min (by C-G formula based on SCr of 0.73 mg/dL). Liver Function Tests: Recent Labs  Lab 05/08/20 2230  AST 37  ALT 30  ALKPHOS 103  BILITOT 0.6  PROT 7.8  ALBUMIN 4.0   No results for input(s): LIPASE, AMYLASE in the last 168 hours. No results for input(s): AMMONIA in the last 168 hours. Coagulation profile Recent Labs  Lab 05/08/20 2230  INR 1.2    CBC: Recent Labs  Lab 05/08/20 2151 05/08/20 2230 05/09/20 0958 05/10/20 0242 05/12/20 0506  WBC  --  8.7 6.3 5.1 5.5  NEUTROABS  --  5.8  --   --   --   HGB 14.6 13.7 9.6* 9.7* 10.4*  HCT 43.0 43.9 29.7* 29.7* 31.5*  MCV  --  66.2* 65.6* 63.9* 62.9*  PLT  --  409* 298 295 309   Cardiac Enzymes: No results for input(s): CKTOTAL, CKMB, CKMBINDEX, TROPONINI in the last 168 hours. BNP (last 3 results) No results for input(s): PROBNP in the last 8760 hours. CBG: Recent Labs  Lab 05/08/20 2140  GLUCAP 95   D-Dimer: No results for input(s): DDIMER in the last 72 hours. Hgb A1c: No results for input(s): HGBA1C in the last 72 hours. Lipid Profile: No results for input(s): CHOL, HDL, LDLCALC, TRIG, CHOLHDL, LDLDIRECT in the last 72 hours. Thyroid function studies: No results for input(s): TSH, T4TOTAL, T3FREE,  THYROIDAB in the last 72 hours.  Invalid  input(s): FREET3 Anemia work up: Recent Labs    05/10/20 0808  FERRITIN 17*  TIBC 468*  IRON 51   Sepsis Labs: Recent Labs  Lab 05/08/20 2230 05/09/20 0110 05/09/20 0958 05/10/20 0242 05/12/20 0506  WBC 8.7  --  6.3 5.1 5.5  LATICACIDVEN 4.0* 2.0*  --   --   --     Microbiology Recent Results (from the past 240 hour(s))  Blood Culture (routine x 2)     Status: None (Preliminary result)   Collection Time: 05/08/20 10:30 PM   Specimen: BLOOD  Result Value Ref Range Status   Specimen Description BLOOD RIGHT ANTECUBITAL  Final   Special Requests   Final    BOTTLES DRAWN AEROBIC AND ANAEROBIC Blood Culture adequate volume   Culture   Final    NO GROWTH 3 DAYS Performed at [facility], [address] [place], [place] [address]    Report Status PENDING  Incomplete  Blood Culture (routine x 2)     Status: None (Preliminary result)   Collection Time: 05/08/20 10:34 PM   Specimen: BLOOD LEFT FOREARM  Result Value Ref Range Status   Specimen Description BLOOD LEFT FOREARM  Final   Special Requests   Final    BOTTLES DRAWN AEROBIC AND ANAEROBIC Blood Culture adequate volume   Culture   Final    NO GROWTH 3 DAYS Performed at [facility], [place] [address], [place], [place] [address]    Report Status PENDING  Incomplete  Respiratory Panel by RT PCR (Flu A&B, Covid) - Nasopharyngeal Swab     Status: None   Collection Time: 05/09/20  1:38 AM   Specimen: Nasopharyngeal Swab; Nasopharyngeal(NP) swabs in vial transport medium  Result Value Ref Range Status   SARS Coronavirus 2 by RT PCR NEGATIVE NEGATIVE Final    Comment: (NOTE) SARS-CoV-2 target nucleic acids are NOT DETECTED.  The SARS-CoV-2 RNA is generally detectable in upper respiratoy specimens during the acute phase of infection. The lowest concentration of SARS-CoV-2 viral copies this assay can detect is 131 copies/mL. A negative result does not preclude  SARS-Cov-2 infection and should not be used as the sole basis for treatment or other patient management decisions. A negative result may occur with  improper specimen collection/handling, submission of specimen other than nasopharyngeal swab, presence of viral mutation(s) within the areas targeted by this assay, and inadequate number of viral copies (<131 copies/mL). A negative result must be combined with clinical observations, patient history, and epidemiological information. The expected result is Negative.  Fact Sheet for Patients:  PinkCheek.be  Fact Sheet for Healthcare Providers:  GravelBags.it  This test is no t yet approved or cleared by the [place] FDA and  has been authorized for detection and/or diagnosis of SARS-CoV-2 by FDA under an Emergency Use Authorization (EUA). This EUA will remain  in effect (meaning this test can be used) for the duration of the COVID-19 declaration under Section 564(b)(1) of the Act, 21 U.S.C. section 360bbb-3(b)(1), unless the authorization is terminated or revoked sooner.     Influenza A by PCR NEGATIVE NEGATIVE Final   Influenza B by PCR NEGATIVE NEGATIVE Final    Comment: (NOTE) The Xpert Xpress SARS-CoV-2/FLU/RSV assay is intended as an aid in  the diagnosis of influenza from Nasopharyngeal swab specimens and  should not be used as a sole basis for treatment. Nasal washings and  aspirates are unacceptable for Xpert Xpress SARS-CoV-2/FLU/RSV  testing.  Fact Sheet for Patients: PinkCheek.be  Fact Sheet for Healthcare Providers: GravelBags.it  This test is not yet approved or cleared by the [place] FDA and  has been authorized for detection and/or diagnosis of SARS-CoV-2 by  FDA under an Emergency Use Authorization (EUA). This EUA will remain  in effect (meaning this test can be used) for the duration of the   Covid-19 declaration under Section 564(b)(1) of the Act, 21  U.S.C. section 360bbb-3(b)(1), unless the authorization is  terminated or revoked. Performed at [facility], [place] [address] [place], [place] [address]   Urine culture     Status: Abnormal   Collection Time: 05/09/20  3:40 AM   Specimen: In/Out Cath Urine  Result Value Ref Range Status   Specimen Description IN/OUT CATH URINE  Final   Special Requests   Final    NONE Performed at [facility], [place] [address] [place], [place] [address]    Culture >=100,000 COLONIES/mL PROTEUS MIRABILIS (A)  Final   Report Status 05/11/2020 FINAL  Final   Organism ID, Bacteria PROTEUS MIRABILIS (A)  Final      Susceptibility   Proteus mirabilis - MIC*    AMPICILLIN <=2 SENSITIVE Sensitive     CEFAZOLIN <=4 SENSITIVE Sensitive     CEFEPIME <=0.12 SENSITIVE Sensitive     CEFTRIAXONE <=0.25 SENSITIVE Sensitive     CIPROFLOXACIN <=0.25 SENSITIVE Sensitive     GENTAMICIN <=1 SENSITIVE Sensitive     IMIPENEM 0.5 SENSITIVE Sensitive     NITROFURANTOIN 128 RESISTANT Resistant     TRIMETH/SULFA <=20 SENSITIVE Sensitive     AMPICILLIN/SULBACTAM <=2 SENSITIVE Sensitive     PIP/TAZO <=4 SENSITIVE Sensitive     * >=100,000 COLONIES/mL PROTEUS MIRABILIS    Procedures and diagnostic studies:  No results found.  Medications:   . feeding supplement  237 mL Oral TID BM  . multivitamin with minerals  1 tablet Oral Daily  . rivaroxaban  20 mg Oral Q supper  . sodium chloride flush  3 mL Intravenous Once  . tamsulosin  0.4 mg Oral QPC supper   Continuous Infusions: . cefTRIAXone (ROCEPHIN)  IV 1 g (05/11/20 1439)     LOS: 3 days   Geradine Girt  [facility]   How to contact the [facility] Attending or Consulting provider [place] or covering provider during after hours [place], for this patient?  1. Check the care team in [facility] and look for a) attending/consulting TRH provider listed and b) the [facility] team listed 2. Log into  [email or web address] and use [place]'s universal password to access. If you do not have the password, please contact the hospital operator. 3. Locate the [facility] provider you are looking for under [facility] and page to a number that you can be directly reached. 4. If you still have difficulty reaching the provider, please page the [facility] (Director on Call) for the Hospitalists listed on amion for assistance.  05/12/2020, 12:09 PM

## 2020-05-12 NOTE — Plan of Care (Signed)

## 2020-05-13 MED ORDER — CEPHALEXIN 250 MG PO CAPS
500.0000 mg | ORAL_CAPSULE | Freq: Two times a day (BID) | ORAL | Status: AC
  Administered 2020-05-13 – 2020-05-14 (×2): 500 mg via ORAL
  Filled 2020-05-13 (×2): qty 2

## 2020-05-13 MED ORDER — TAMSULOSIN HCL 0.4 MG PO CAPS
0.4000 mg | ORAL_CAPSULE | Freq: Every day | ORAL | Status: DC
Start: 2020-05-13 — End: 2020-05-18

## 2020-05-13 MED ORDER — ENSURE ENLIVE PO LIQD
237.0000 mL | Freq: Three times a day (TID) | ORAL | 12 refills | Status: DC
Start: 2020-05-13 — End: 2020-12-13

## 2020-05-13 NOTE — Progress Notes (Signed)
Pt IV access infiltrated , pt is refusing new IV  Antibiotic due 1500 IV  MD notified  Will continue to monitor

## 2020-05-13 NOTE — Progress Notes (Signed)
Progress Note    Richard [name]  DJM:[id number] DOB: August 17, 1940  DOA: 05/08/2020 PCP: [name], NP    Brief Narrative:     Medical records reviewed and are as summarized below:  Richard [name] is an 79 y.o. 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 Julien Nordmann, paroxysmal A. fib and PE on Xarelto, hypertension, hyperlipidemia, COVID-19 infection in September this year presented to the ED via EMS as code stroke for evaluation of unresponsiveness. Reportedly patient was in his normal state of health and became suddenly unresponsive at 1700 while having dinner with family. When EMS arrived they noticed that he had right gaze deviation, slurred speech, and was hypotensive with blood pressure in the 80s. Patient was given IV fluids and blood pressure improved.    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 Tachycardic and tachypneic. Hypotensive with systolic in the low 22W. Lactic acid elevated at 4.0. SARS-CoV-2 PCR test and influenza panel negative. Chest x-ray not suggestive of pneumonia.  -u/a suggestive of UTI -culture with proteus -narrow abx to rocephin x 5 days  Transient altered level of consciousness:  -per neurology: With patients with dementia, spell duration is not always as reliable. Certainly, with his previous stroke, this could explain focal deficits in the setting of hypoperfusion, but could also support a todd's phenomenon. Given the global nature, I think that stroke/tia much less likely.Workup so far with MRI brain is neg for acute stroke and no obvious abnormality that could be a seizure focus. EEG  neg for seizure activity. + for mild diffuse encephalopathy. -PT eval  Hypothermia: Suspect due to severe sepsis. 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 Julien Nordmann from oncology.  Paroxysmal A. Fib:  -Resume Xarelto   History of PE -Resume Xarelto- will need to ensure not a fall risk and he can afford  Hypertension -Hold antihypertensives given severe sepsis/hypotension   Family Communication/Anticipated D/C date and plan/Code Status   DVT prophylaxis: xarelto Code Status: Full Code.  Disposition Plan: Status is: Inpatient  Remains inpatient appropriate because:Inpatient level of care appropriate due to severity of illness   Dispo: The patient is from: Home              Anticipated d/c is to: tbd              Anticipated d/c date is: 2 days              Patient currently is awaiting SNF placement         Medical Consultants:    neurology  Subjective:   Says he lives across the roof near [place]  Objective:    Vitals:   05/12/20 0946 05/12/20 2035 05/13/20 0440 05/13/20 1146  BP: (!) 108/59 (!) 112/55 99/78 (!) 104/59  Pulse: 100 (!) 101 100 98  Resp: 20 18 18 18   Temp: (!) 97 F (36.1 C) 98.3 F (36.8 C) 98.2 F (36.8 C) (!) 97.4 F (36.3 C)  TempSrc: Oral Oral Oral Oral  SpO2: 99% 98% 98% 96%  Weight:   70.2 kg   Height:        Intake/Output Summary (Last 24 hours) at 05/13/2020 1208 Last data filed at 05/13/2020 1153 Gross per 24 hour  Intake 480 ml  Output 1400 ml  Net -920 ml   Filed Weights   05/08/20 2306 05/11/20 0102 05/13/20 0440  Weight: 72.6 kg 69.8 kg 70.2 kg    Exam:  General: Appearance:    Thin male in no acute distress     Lungs:      respirations unlabored  Heart:    Normal heart rate. .   MS:   All extremities are intact.   Neurologic:   Awake, alert, pleasantly confused about situation                       Data  Reviewed:   I have personally reviewed following labs and imaging studies:  Labs: Labs show the following:   Basic Metabolic Panel: Recent Labs  Lab 05/08/20 2151 05/08/20 2151 05/08/20 2230 05/08/20 2230 05/09/20 0958 05/09/20 0958 05/10/20 0242 05/12/20 0506  NA 143  --  142  --  143  --  142 137  K 4.3   < > 3.9   < > 3.9   < > 3.7 4.5  CL 111  --  106  --  109  --  109 106  CO2  --   --  22  --  25  --  22 22  GLUCOSE 150*  --  159*  --  95  --  80 93  BUN 27*  --  18  --  14  --  12 12  CREATININE 0.90  --  1.19  --  0.98  --  0.95 0.73  CALCIUM  --   --  10.5*  --  9.2  --  9.2 9.4   < > = values in this interval not displayed.   GFR Estimated Creatinine Clearance: 76.8 mL/min (by C-G formula based on SCr of 0.73 mg/dL). Liver Function Tests: Recent Labs  Lab 05/08/20 2230  AST 37  ALT 30  ALKPHOS 103  BILITOT 0.6  PROT 7.8  ALBUMIN 4.0   No results for input(s): LIPASE, AMYLASE in the last 168 hours. No results for input(s): AMMONIA in the last 168 hours. Coagulation profile Recent Labs  Lab 05/08/20 2230  INR 1.2    CBC: Recent Labs  Lab 05/08/20 2151 05/08/20 2230 05/09/20 0958 05/10/20 0242 05/12/20 0506  WBC  --  8.7 6.3 5.1 5.5  NEUTROABS  --  5.8  --   --   --   HGB 14.6 13.7 9.6* 9.7* 10.4*  HCT 43.0 43.9 29.7* 29.7* 31.5*  MCV  --  66.2* 65.6* 63.9* 62.9*  PLT  --  409* 298 295 309   Cardiac Enzymes: No results for input(s): CKTOTAL, CKMB, CKMBINDEX, TROPONINI in the last 168 hours. BNP (last 3 results) No results for input(s): PROBNP in the last 8760 hours. CBG: Recent Labs  Lab 05/08/20 2140  GLUCAP 95   D-Dimer: No results for input(s): DDIMER in the last 72 hours. Hgb A1c: No results for input(s): HGBA1C in the last 72 hours. Lipid Profile: No results for input(s): CHOL, HDL, LDLCALC, TRIG, CHOLHDL, LDLDIRECT in the last 72 hours. Thyroid function studies: No results for input(s): TSH, T4TOTAL, T3FREE, THYROIDAB in  the last 72 hours.  Invalid input(s): FREET3 Anemia work up: No results for input(s): VITAMINB12, FOLATE,  FERRITIN, TIBC, IRON, RETICCTPCT in the last 72 hours. Sepsis Labs: Recent Labs  Lab 05/08/20 2230 05/09/20 0110 05/09/20 0958 05/10/20 0242 05/12/20 0506  WBC 8.7  --  6.3 5.1 5.5  LATICACIDVEN 4.0* 2.0*  --   --   --     Microbiology Recent Results (from the past 240 hour(s))  Blood Culture (routine x 2)     Status: None (Preliminary result)   Collection Time: 05/08/20 10:30 PM   Specimen: BLOOD  Result Value Ref Range Status   Specimen Description BLOOD RIGHT ANTECUBITAL  Final   Special Requests   Final    BOTTLES DRAWN AEROBIC AND ANAEROBIC Blood Culture adequate volume   Culture   Final    NO GROWTH 4 DAYS Performed at [facility], [place] [address] [place], [place] [address]    Report Status PENDING  Incomplete  Blood Culture (routine x 2)     Status: None (Preliminary result)   Collection Time: 05/08/20 10:34 PM   Specimen: BLOOD LEFT FOREARM  Result Value Ref Range Status   Specimen Description BLOOD LEFT FOREARM  Final   Special Requests   Final    BOTTLES DRAWN AEROBIC AND ANAEROBIC Blood Culture adequate volume   Culture   Final    NO GROWTH 4 DAYS Performed at [facility], [place] [address], [place], [place] [address]    Report Status PENDING  Incomplete  Respiratory Panel by RT PCR (Flu A&B, Covid) - Nasopharyngeal Swab     Status: None   Collection Time: 05/09/20  1:38 AM   Specimen: Nasopharyngeal Swab; Nasopharyngeal(NP) swabs in vial transport medium  Result Value Ref Range Status   SARS Coronavirus 2 by RT PCR NEGATIVE NEGATIVE Final    Comment: (NOTE) SARS-CoV-2 target nucleic acids are NOT DETECTED.  The SARS-CoV-2 RNA is generally detectable in upper respiratoy specimens during the acute phase of infection. The lowest concentration of SARS-CoV-2 viral copies this assay can detect is 131 copies/mL. A negative result does not  preclude SARS-Cov-2 infection and should not be used as the sole basis for treatment or other patient management decisions. A negative result may occur with  improper specimen collection/handling, submission of specimen other than nasopharyngeal swab, presence of viral mutation(s) within the areas targeted by this assay, and inadequate number of viral copies (<131 copies/mL). A negative result must be combined with clinical observations, patient history, and epidemiological information. The expected result is Negative.  Fact Sheet for Patients:  PinkCheek.be  Fact Sheet for Healthcare Providers:  GravelBags.it  This test is no t yet approved or cleared by the [place] FDA and  has been authorized for detection and/or diagnosis of SARS-CoV-2 by FDA under an Emergency Use Authorization (EUA). This EUA will remain  in effect (meaning this test can be used) for the duration of the COVID-19 declaration under Section 564(b)(1) of the Act, 21 U.S.C. section 360bbb-3(b)(1), unless the authorization is terminated or revoked sooner.     Influenza A by PCR NEGATIVE NEGATIVE Final   Influenza B by PCR NEGATIVE NEGATIVE Final    Comment: (NOTE) The Xpert Xpress SARS-CoV-2/FLU/RSV assay is intended as an aid in  the diagnosis of influenza from Nasopharyngeal swab specimens and  should not be used as a sole basis for treatment. Nasal washings and  aspirates are unacceptable for Xpert Xpress SARS-CoV-2/FLU/RSV  testing.  Fact Sheet for Patients: PinkCheek.be  Fact Sheet for Healthcare Providers: GravelBags.it  This test is not yet approved  or cleared by the [place] and  has been authorized for detection and/or diagnosis of SARS-CoV-2 by  FDA under an Emergency Use Authorization (EUA). This EUA will remain  in effect (meaning this test can be used) for the  duration of the  Covid-19 declaration under Section 564(b)(1) of the Act, 21  U.S.C. section 360bbb-3(b)(1), unless the authorization is  terminated or revoked. Performed at [facility], [place] [address], [place], [place] [address]   Urine culture     Status: Abnormal   Collection Time: 05/09/20  3:40 AM   Specimen: In/Out Cath Urine  Result Value Ref Range Status   Specimen Description IN/OUT CATH URINE  Final   Special Requests   Final    NONE Performed at [facility], [place] [address] [place], [place] [address]    Culture >=100,000 COLONIES/mL PROTEUS MIRABILIS (A)  Final   Report Status 05/11/2020 FINAL  Final   Organism ID, Bacteria PROTEUS MIRABILIS (A)  Final      Susceptibility   Proteus mirabilis - MIC*    AMPICILLIN <=2 SENSITIVE Sensitive     CEFAZOLIN <=4 SENSITIVE Sensitive     CEFEPIME <=0.12 SENSITIVE Sensitive     CEFTRIAXONE <=0.25 SENSITIVE Sensitive     CIPROFLOXACIN <=0.25 SENSITIVE Sensitive     GENTAMICIN <=1 SENSITIVE Sensitive     IMIPENEM 0.5 SENSITIVE Sensitive     NITROFURANTOIN 128 RESISTANT Resistant     TRIMETH/SULFA <=20 SENSITIVE Sensitive     AMPICILLIN/SULBACTAM <=2 SENSITIVE Sensitive     PIP/TAZO <=4 SENSITIVE Sensitive     * >=100,000 COLONIES/mL PROTEUS MIRABILIS    Procedures and diagnostic studies:  No results found.  Medications:   . feeding supplement  237 mL Oral TID BM  . multivitamin with minerals  1 tablet Oral Daily  . rivaroxaban  20 mg Oral Q supper  . sodium chloride flush  3 mL Intravenous Once  . tamsulosin  0.4 mg Oral QPC supper   Continuous Infusions: . cefTRIAXone (ROCEPHIN)  IV 1 g (05/12/20 1356)     LOS: 4 days   Geradine Girt  [facility]   How to contact the [facility] Attending or Consulting provider [place] or covering provider during after hours [place], for this patient?  1. Check the care team in [facility] and look for a) attending/consulting TRH provider listed and b) the [facility] team  listed 2. Log into [email or web address] and use [place]'s universal password to access. If you do not have the password, please contact the hospital operator. 3. Locate the [facility] provider you are looking for under [facility] and page to a number that you can be directly reached. 4. If you still have difficulty reaching the provider, please page the [facility] (Director on Call) for the Hospitalists listed on amion for assistance.  05/13/2020, 12:08 PM

## 2020-05-14 LAB — CULTURE, BLOOD (ROUTINE X 2)
Culture: NO GROWTH
Culture: NO GROWTH
Special Requests: ADEQUATE
Special Requests: ADEQUATE

## 2020-05-14 LAB — SARS CORONAVIRUS 2 BY RT PCR (HOSPITAL ORDER, PERFORMED IN ~~LOC~~ HOSPITAL LAB): SARS Coronavirus 2: NEGATIVE

## 2020-05-14 NOTE — Consult Note (Signed)
   [facility] CM Inpatient Consult   05/14/2020  Shawndale Graybeal October 06, 1940 [id number]   [place] Organization [ACO] Patient:  [place] Medicare   Patient screened for high risk score for unplanned readmission score and for 2 hospitalizations and 3 ED visits in the past 6 months.   Review of patient's medical record reveals patient is being recommendation for a skilled nursing facility level of care.  Primary Care Provider is [name], NP this provider is listed to provide the transition of care [TOC] for post hospital follow up.  Plan: Continue to follow progress for disposition. Will sign off at transition ot SNF.  For questions contact:   Natividad Brood, RN BSN [facility] Liaison  [phone] business mobile phone Toll free office [phone]  Fax number: [phone] [email or web address]

## 2020-05-14 NOTE — Progress Notes (Signed)
PT Cancellation Note  Patient Details Name: Richard [name] MRN: [id number] DOB: 04-23-1941   Cancelled Treatment:    Reason Eval/Treat Not Completed: Other (comment) Pt family members requesting he eat lunch prior to therapy session.  Wyona Almas, PT, DPT Acute Rehabilitation Services Pager [phone] Office [phone]    Deno Etienne 05/14/2020, 12:47 PM

## 2020-05-14 NOTE — TOC Benefit Eligibility Note (Signed)
Transition of Care [facility]) Benefit Eligibility Note    Patient Details  Name: Richard [name] MRN: [id number] Date of Birth: December 21, 1940   Medication/Dose: ELIQUIS  2.5 MG   CO-PAY- $131.81   and    ELIQUIS 5 MG BID  CO-PAY- $131.81      APIXABAN : NON-FORMULARY  Covered?: Yes  Tier: 3 Drug  Prescription Coverage Preferred Pharmacy: [name]  [name] with Person/Company/Phone Number:: [name]  @ [name]  Co-Pay: $  131.81  Prior Approval: No  Deductible: Unmet (OUT-OF-POCKET:UNMET)  Additional Notes: XARELTO  20 MG DAILY : COVER- YES CO-PAY- $ 130.08 TIER- 3 DRUG  P/A-NO    RIVAROXABAN : Crecencio Mc Phone Number: 05/14/2020, 10:50 AM

## 2020-05-14 NOTE — Care Management Important Message (Signed)
Important Message  Patient Details  Name: Richard [name] MRN: [id number] Date of Birth: 26-Dec-1940   Medicare Important Message Given:  Yes     Shelda Altes 05/14/2020, 9:03 AM

## 2020-05-14 NOTE — Progress Notes (Signed)
Physical Therapy Treatment Patient Details Name: Richard [name] MRN: [id number] DOB: [birth year] Today's Date: 05/14/2020    History of Present Illness 79 yo male wtih onset of unresponsiveness at home with family was brought to ED for tx.  Note his UTI with encephalopathy, R pleural effusion and dementia.  PMHx:  ventriculomegaly, stage 3 lung CA, respiratory failure, ischemic white matter changes on MRI, a-fib, PE, L THA with pain    PT Comments    Pt progressing slowly towards physical therapy goals. Pt requiring min assist for bed mobility, mod assist to stand x 3 from edge of bed. Able to work on reciprocal scooting at edge of bed. Unable to take steps due to bilateral knee flexion contractures, will likely need to utilize [facility] for transfer to chair. Continue to recommend SNF for ongoing Physical Therapy.       Follow Up Recommendations  SNF     Equipment Recommendations  None recommended by PT    Recommendations for Other Services       Precautions / Restrictions Precautions Precautions: Fall Restrictions Weight Bearing Restrictions: No    Mobility  Bed Mobility Overal bed mobility: Needs Assistance Bed Mobility: Supine to Sit;Sit to Supine     Supine to sit: Min assist Sit to supine: Min assist   General bed mobility comments: MinA for trunk to upright and assist for LE's back into bed  Transfers Overall transfer level: Needs assistance Equipment used: Rolling walker (2 wheeled);1 person hand held assist;2 person hand held assist Transfers: Sit to/from Stand Sit to Stand: Mod assist;+2 physical assistance         General transfer comment: ModA + 2 to rise to stand from edge of bed x 3  Ambulation/Gait                 Stairs             Wheelchair Mobility    Modified Rankin (Stroke Patients Only)       Balance Overall balance assessment: Needs assistance Sitting-balance support: Feet supported Sitting balance-Leahy Scale: Fair      Standing balance support: Bilateral upper extremity supported;During functional activity Standing balance-Leahy Scale: Poor Standing balance comment: reliant on external support                            Cognition Arousal/Alertness: Awake/alert Behavior During Therapy: Flat affect Overall Cognitive Status: History of cognitive impairments - at baseline                                 General Comments: History of dementia      Exercises      General Comments        Pertinent Vitals/Pain Pain Assessment: Faces Faces Pain Scale: Hurts little more Pain Location: back Pain Descriptors / Indicators: Grimacing;Guarding Pain Intervention(s): Monitored during session;Limited activity within patient's tolerance    Home Living                      Prior Function            PT Goals (current goals can now be found in the care plan section) Acute Rehab PT Goals Patient Stated Goal: go home Potential to Achieve Goals: Fair    Frequency    Min 2X/week      PT Plan Current plan remains appropriate  Co-evaluation              AM-PAC PT "6 Clicks" Mobility   Outcome Measure  Help needed turning from your back to your side while in a flat bed without using bedrails?: A Little Help needed moving from lying on your back to sitting on the side of a flat bed without using bedrails?: A Little Help needed moving to and from a bed to a chair (including a wheelchair)?: Total Help needed standing up from a chair using your arms (e.g., wheelchair or bedside chair)?: A Lot Help needed to walk in hospital room?: Total Help needed climbing 3-5 steps with a railing? : Total 6 Click Score: 11    End of Session Equipment Utilized During Treatment: Gait belt Activity Tolerance: Patient limited by pain Patient left: in bed;with call bell/phone within reach;with bed alarm set Nurse Communication: Mobility status PT Visit Diagnosis:  Unsteadiness on feet (R26.81);Muscle weakness (generalized) (M62.81);Difficulty in walking, not elsewhere classified (R26.2);Pain Pain - Right/Left: Left Pain - part of body: Hip     Time: 1435-1451 PT Time Calculation (min) (ACUTE ONLY): 16 min  Charges:  $Therapeutic Activity: 8-22 mins                     Wyona Almas, PT, DPT Acute Rehabilitation Services Pager [phone] Office [phone]    Richard [name] 05/14/2020, 4:44 PM

## 2020-05-14 NOTE — Discharge Summary (Addendum)
Physician Discharge Summary  Ericson Nafziger RAQ:[id number] DOB: June 06, 1941 DOA: 05/08/2020  PCP: [name], NP  Admit date: 05/08/2020 Discharge date: 05/15/2020  Admitted From: home Discharge disposition: SNF   Recommendations for Outpatient Follow-Up:   1. Would recommend outpatient palliative care to see at SNF 2. 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  Diet recommendation: Regular.  Wound care: None.  Code status: Full.   History of Present Illness:   Richard [name] is a 79 y.o. 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 Julien Nordmann, paroxysmal A. fib and PE on Xarelto, hypertension, hyperlipidemia, COVID-19 infection in September this year presented to the ED via EMS as code stroke for evaluation of unresponsiveness. Reportedly patient was in his normal state of health and became suddenly unresponsive at 1700 while having dinner with family. When EMS arrived they noticed that he had right gaze deviation, slurred speech, and was hypotensive with blood pressure in the 80s. Patient was given IV fluids and blood pressure improved.  History provided by patient very limited given his dementia.  He is not sure why he is here.  His only complaint is feeling dizzy yesterday.  Denies cough, shortness of breath, chest pain, nausea, vomiting, abdominal pain, or dysuria.    Hospital Course by Problem:   Severe sepsis due to proteus UTI: Hypothermic with temperature 94.3 F on arrival. Tachycardic and tachypneic. Hypotensive with systolic in the low 45G. Lactic acid elevated at 4.0. SARS-CoV-2 PCR test and influenza panel negative. Chest  x-ray not suggestive of pneumonia.  -u/a suggestive of UTI -culture with proteus -treated for 5 days  Transient altered level of consciousness:  -per neurology: With patients with dementia, spell duration is not always as reliable. Certainly, with his previous stroke, this could explain focal deficits in the setting of hypoperfusion, but could also support a todd's phenomenon. Given the global nature, I think that stroke/tia much less likely.Workup so far withMRIbrain isneg for acute strokeand no obvious abnormality that could be a seizure focus. EEG neg for seizure activity. + for mild diffuse encephalopathy. -PT eval  Hypothermia: Suspect due to severe sepsis. 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 Julien Nordmann from oncology.  Paroxysmal A. Fib: -Resume Xarelto  -add BB if able once BP improved  History of PE -Resume Xarelto  Hypertension -BP lower end of normal  Nutrition Status: Nutrition Problem: Severe Malnutrition Etiology: chronic illness (dementia) Signs/Symptoms: moderate muscle depletion, severe muscle depletion, moderate fat depletion, severe fat depletion Interventions: Ensure Enlive (each supplement provides 350kcal and 20 grams of protein), MVI     Medical Consultants:    neurology  Discharge Exam:   Vitals:   05/15/20 0427 05/15/20 0522  BP: (!) 100/55 109/79  Pulse: 98 98  Resp: 18 16  Temp: 97.6 F (36.4 C) 98 F (36.7 C)  SpO2: 97% 97%   Vitals:   05/14/20 1143 05/14/20 2005 05/15/20 0427 05/15/20 0522  BP: 113/64 127/66 (!) 100/55 109/79  Pulse: (!) 103 (!) 109 98 98  Resp: 18 18 18  16  Temp: 97.9 F (36.6 C) 98.1 F (36.7 C) 97.6 F (36.4 C) 98 F (36.7 C)  TempSrc: Oral Oral Oral Oral  SpO2: 99% 97% 97% 97%  Weight:   70 kg   Height:        General exam: Appears calm and comfortable.    The results of significant diagnostics from  this hospitalization (including imaging, microbiology, ancillary and laboratory) are listed below for reference.     Procedures and Diagnostic Studies:   MR BRAIN WO CONTRAST  Result Date: 05/09/2020 CLINICAL DATA:  Syncope, simple, abnormal neuro exam. Code stroke with right-sided gaze. The examination had to be discontinued prior to completion due to pain. Axial coronal diffusion weighted images were performed. Sagittal T1 weighted images were performed. EXAM: MRI HEAD WITHOUT CONTRAST TECHNIQUE: Multiplanar, multiecho pulse sequences of the brain and surrounding structures were obtained without intravenous contrast. COMPARISON:  CT head without contrast 05/08/2020. MR head without contrast 01/04/2019 FINDINGS: Brain: The diffusion-weighted images demonstrate no acute or subacute infarction. Moderate generalized atrophy is present. Lateral ventricles and third ventricle are dilated. Fourth ventricle is of normal size. There is thinning of the corpus callosum, stable. No focal abnormality is present. The brainstem and cerebellum are within normal limits. Vascular: Not assessed Skull and upper cervical spine: Distorted by motion Sinuses/Orbits: Sinuses are grossly clear. Orbits are not well assessed by the limited study. IMPRESSION: 1. No acute intracranial abnormality or significant interval change. 2. Moderate generalized atrophy. This likely reflects the sequela of chronic microvascular ischemia. Electronically Signed   By: San Morelle M.D.   On: 05/09/2020 07:00   CT ABDOMEN PELVIS W CONTRAST  Result Date: 05/09/2020 CLINICAL DATA:  Acute abdominal pain. Patient was unresponsive at dinner but now all ir to self. History of blood clots and cancer. EXAM: CT ABDOMEN AND PELVIS WITH CONTRAST TECHNIQUE: Multidetector CT imaging of the abdomen and pelvis was performed using the standard protocol following bolus administration of intravenous contrast. CONTRAST:  159mL OMNIPAQUE IOHEXOL 300 MG/ML   SOLN COMPARISON:  01/31/2020 FINDINGS: Lower chest: Small right pleural effusion with patchy infiltration or scarring in the right lung base, unchanged since prior study. 6 mm nodule in the left lung base is unchanged since prior study. Volume loss in the right lung. Hepatobiliary: No focal liver abnormality is seen. No gallstones, gallbladder wall thickening, or biliary dilatation. Pancreas: Unremarkable. No pancreatic ductal dilatation or surrounding inflammatory changes. Spleen: Normal in size without focal abnormality. Adrenals/Urinary Tract: Adrenal glands are unremarkable. Kidneys are normal, without renal calculi, focal lesion, or hydronephrosis. Bladder wall is diffusely thickened, possibly due to chronic outlet obstruction or cystitis. Layering debris in the dependent bladder may represent evidence of hemorrhage or infection. Stomach/Bowel: Stomach is fluid-filled without abnormal distention or wall thickening. Small bowel are decompressed. Stool fills the colon with particular prominence of rectal stool. No wall thickening or inflammatory changes are demonstrated. Appendix is normal. Vascular/Lymphatic: Aortic atherosclerosis. No enlarged abdominal or pelvic lymph nodes. Reproductive: Lobular enlargement of the prostate gland, impressing on the bladder base. Other: No free air or free fluid in the abdomen. Abdominal wall musculature appears intact. Musculoskeletal: Postoperative left hip arthroplasty. Degenerative changes in the spine. No destructive bone lesions. IMPRESSION: 1. No acute process demonstrated in the abdomen or pelvis. 2. Small right pleural effusion with patchy infiltration or scarring in the right lung base, unchanged since prior study. 6 mm nodule in the left lung base is unchanged since prior study. 3. Lobular enlargement of the prostate gland,  impressing on the bladder base. 4. Bladder wall is diffusely thickened, possibly due to chronic outlet obstruction or cystitis. Layering debris  in the dependent bladder may represent evidence of hemorrhage or infection. Correlation with urinalysis recommended. 5. Stool fills the colon with particular prominence of rectal stool. No evidence of bowel obstruction. 6. Aortic atherosclerosis. Aortic Atherosclerosis (ICD10-I70.0). Electronically Signed   By: Lucienne Capers M.D.   On: 05/09/2020 00:07   DG Chest Port 1 View  Result Date: 05/09/2020 CLINICAL DATA:  Sepsis. EXAM: PORTABLE CHEST 1 VIEW COMPARISON:  02/16/2020 FINDINGS: There are airspace opacities in the right lower lung zone and right mid lung zone. No pneumothorax. There is elevation of the right hemidiaphragm. Emphysematous changes are noted. There is right-sided volume loss. There is no acute displaced fracture. There is a small right-sided pleural effusion. The heart size appears unremarkable. IMPRESSION: 1. Small right-sided pleural effusion. 2. Again noted are post treatment changes in the right lung zone likely related to the patient's prior history of lung cancer. Electronically Signed   By: Constance Holster M.D.   On: 05/09/2020 01:07   EEG adult  Result Date: 05/09/2020 Lora Havens, MD     05/09/2020  9:56 AM Patient Name: Clevon Khader MRN: [id number] Epilepsy Attending: Lora Havens Referring Physician/Provider: Dr. Shela Leff Date: 05/09/2020 Duration: 23.44 mins Patient history: 79 year old male with brief period of sudden unresponsiveness. EEG to evaluate for seizures. Level of alertness: Awake AEDs during EEG study: None Technical aspects: This EEG study was done with scalp electrodes positioned according to the 10-20 International system of electrode placement. Electrical activity was acquired at a sampling rate of 500Hz  and reviewed with a high frequency filter of 70Hz  and a low frequency filter of 1Hz . EEG data were recorded continuously and digitally stored. Description: The posterior dominant rhythm consists of 8 Hz activity of moderate voltage  (25-35 uV) seen predominantly in posterior head regions, symmetric and reactive to eye opening and eye closing. EEG showed continuous generalized 5 to 6 Hz theta as well as intermittent generalized 2-3Hz  delta slowing. Hyperventilation and photic stimulation were not performed.   ABNORMALITY -Continuous slow, generalized IMPRESSION: This study is suggestive of mild diffuse encephalopathy, nonspecific etiology. No seizures or epileptiform discharges were seen throughout the recording. Lora Havens   ECHOCARDIOGRAM COMPLETE  Result Date: 05/09/2020    ECHOCARDIOGRAM REPORT   Patient Name:   [facility] Date of Exam: 05/09/2020 Medical Rec #:  [id number]    Height:       75.0 in Accession #:    [id number]   Weight:       160.1 lb Date of Birth:  January 24, 1941    BSA:          1.996 m Patient Age:    32 years     BP:           123/74 mmHg Patient Gender: M            HR:           105 bpm. Exam Location:  Inpatient Procedure: 2D Echo Indications:    Syncope 780.2 / R55  History:        Patient has prior history of Echocardiogram examinations, most                 recent 11/05/2019. Risk Factors:Hypertension and Dyslipidemia.                 Squamous cell carcinoma of the lung.  History of paroxysmal A                 Fib. Acute respiratory failure with hypoxia. Severe sepsis                 secondary to UTI.  Sonographer:    Darlina Sicilian RDCS Referring Phys: [id number] [place]  1. Left ventricular ejection fraction, by estimation, is 55 to 60%. The left ventricle has normal function. The left ventricle has no regional wall motion abnormalities. Left ventricular diastolic parameters are indeterminate.  2. Right ventricular systolic function is normal. The right ventricular size is normal. There is normal pulmonary artery systolic pressure.  3. The mitral valve is normal in structure. Trivial mitral valve regurgitation. No evidence of mitral stenosis.  4. The aortic valve is grossly normal. Aortic  valve regurgitation is not visualized.  5. Pulmonic valve regurgitation not assessed.  6. The inferior vena cava is normal in size with greater than 50% respiratory variability, suggesting right atrial pressure of 3 mmHg. Comparison(s): No significant change from prior study. Conclusion(s)/Recommendation(s): Limited windows--no parasternal or apical views, study done subcostal. FINDINGS  Left Ventricle: Left ventricular ejection fraction, by estimation, is 55 to 60%. The left ventricle has normal function. The left ventricle has no regional wall motion abnormalities. The left ventricular internal cavity size was normal in size. There is  no left ventricular hypertrophy. Left ventricular diastolic parameters are indeterminate. Right Ventricle: The right ventricular size is normal. No increase in right ventricular wall thickness. Right ventricular systolic function is normal. There is normal pulmonary artery systolic pressure. The tricuspid regurgitant velocity is 1.83 m/s, and  with an assumed right atrial pressure of 3 mmHg, the estimated right ventricular systolic pressure is 44.0 mmHg. Left Atrium: Left atrial size was normal in size. Right Atrium: Right atrial size was normal in size. Pericardium: There is no evidence of pericardial effusion. Mitral Valve: The mitral valve is normal in structure. Trivial mitral valve regurgitation. No evidence of mitral valve stenosis. Tricuspid Valve: The tricuspid valve is normal in structure. Tricuspid valve regurgitation is trivial. No evidence of tricuspid stenosis. Aortic Valve: The aortic valve is grossly normal. Aortic valve regurgitation is not visualized. Pulmonic Valve: The pulmonic valve was not well visualized. Pulmonic valve regurgitation not assessed. Aorta: The aortic root was not well visualized and the ascending aorta was not well visualized. Venous: The inferior vena cava is normal in size with greater than 50% respiratory variability, suggesting right atrial  pressure of 3 mmHg. IAS/Shunts: The atrial septum is grossly normal.  LEFT VENTRICLE PLAX 2D LVIDd:         3.45 cm     Diastology LVIDs:         2.70 cm     LV e' medial:    6.20 cm/s LV PW:         0.90 cm     LV E/e' medial:  9.0 LV IVS:        1.10 cm     LV e' lateral:   5.33 cm/s LVOT diam:     2.20 cm     LV E/e' lateral: 10.5 LV SV:         45 LV SV Index:   23 LVOT Area:     3.80 cm  LV Volumes (MOD) LV vol d, MOD A2C: 77.6 ml LV vol d, MOD A4C: 85.1 ml LV vol s, MOD A2C: 26.9 ml LV vol s, MOD A4C: 55.4 ml  LV SV MOD A2C:     50.7 ml LV SV MOD A4C:     85.1 ml LV SV MOD BP:      43.7 ml RIGHT VENTRICLE RV S prime:     7.62 cm/s LEFT ATRIUM             Index       RIGHT ATRIUM           Index LA diam:        2.10 cm 1.05 cm/m  RA Area:     10.20 cm LA Vol (A2C):   21.1 ml 10.57 ml/m RA Volume:   21.90 ml  10.97 ml/m LA Vol (A4C):   30.6 ml 15.33 ml/m LA Biplane Vol: 26.1 ml 13.08 ml/m  AORTIC VALVE LVOT Vmax:   64.60 cm/s LVOT Vmean:  49.900 cm/s LVOT VTI:    0.119 m  AORTA Ao Root diam: 3.20 cm MITRAL VALVE               TRICUSPID VALVE MV Area (PHT): 7.99 cm    TR Peak grad:   13.4 mmHg MV Decel Time: 95 msec     TR Vmax:        183.00 cm/s MV E velocity: 55.70 cm/s MV A velocity: 77.60 cm/s  SHUNTS MV E/A ratio:  0.72        Systemic VTI:  0.12 m                            Systemic Diam: 2.20 cm Buford Dresser MD Electronically signed by Buford Dresser MD Signature Date/Time: 05/09/2020/5:27:30 PM    Final    DG Hip Unilat W or Wo Pelvis 2-3 Views Right  Result Date: 05/09/2020 CLINICAL DATA:  Pain EXAM: DG HIP (WITH OR WITHOUT PELVIS) 2-3V RIGHT COMPARISON:  CT from same day FINDINGS: There are advanced degenerative changes of the right hip without evidence for an acute displaced fracture or dislocation. The patient is status post total hip arthroplasty on the left. IMPRESSION: Advanced degenerative changes of the right hip without evidence for an acute displaced fracture or  dislocation. Electronically Signed   By: Constance Holster M.D.   On: 05/09/2020 01:10   CT HEAD CODE STROKE WO CONTRAST  Result Date: 05/08/2020 CLINICAL DATA:  Code stroke.  Right facial droop EXAM: CT HEAD WITHOUT CONTRAST TECHNIQUE: Contiguous axial images were obtained from the base of the skull through the vertex without intravenous contrast. COMPARISON:  11/04/2019 FINDINGS: Brain: There is no mass, hemorrhage or extra-axial collection. Severe ventriculomegaly, unchanged. There is hypoattenuation of the periventricular white matter, most commonly indicating chronic ischemic microangiopathy. Old right occipital infarct. Vascular: No abnormal hyperdensity of the major intracranial arteries or dural venous sinuses. No intracranial atherosclerosis. Skull: The visualized skull base, calvarium and extracranial soft tissues are normal. Sinuses/Orbits: No fluid levels or advanced mucosal thickening of the visualized paranasal sinuses. No mastoid or middle ear effusion. The orbits are normal. ASPECTS [facility] Stroke Program Early CT Score) - Ganglionic level infarction (caudate, lentiform nuclei, internal capsule, insula, M1-M3 cortex): 7 - Supraganglionic infarction (M4-M6 cortex): 3 Total score (0-10 with 10 being normal): 10 IMPRESSION: 1. No acute intracranial abnormality. 2. ASPECTS is 10. 3. 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 These results were communicated to Dr. Roland Rack at 9:57 pm on 05/08/2020 by text page via the [facility] messaging system. Electronically Signed   By: Cletus Gash.D.  On: 05/08/2020 21:57     Labs:   Basic Metabolic Panel: Recent Labs  Lab 05/08/20 2151 05/08/20 2151 05/08/20 2230 05/08/20 2230 05/09/20 0958 05/09/20 0958 05/10/20 0242 05/12/20 0506  NA 143  --  142  --  143  --  142 137  K 4.3   < > 3.9   < > 3.9   < > 3.7 4.5  CL 111  --  106  --  109  --  109 106  CO2  --   --  22  --  25  --  22 22  GLUCOSE 150*   --  159*  --  95  --  80 93  BUN 27*  --  18  --  14  --  12 12  CREATININE 0.90  --  1.19  --  0.98  --  0.95 0.73  CALCIUM  --   --  10.5*  --  9.2  --  9.2 9.4   < > = values in this interval not displayed.   GFR Estimated Creatinine Clearance: 76.6 mL/min (by C-G formula based on SCr of 0.73 mg/dL). Liver Function Tests: Recent Labs  Lab 05/08/20 2230  AST 37  ALT 30  ALKPHOS 103  BILITOT 0.6  PROT 7.8  ALBUMIN 4.0   No results for input(s): LIPASE, AMYLASE in the last 168 hours. No results for input(s): AMMONIA in the last 168 hours. Coagulation profile Recent Labs  Lab 05/08/20 2230  INR 1.2    CBC: Recent Labs  Lab 05/08/20 2151 05/08/20 2230 05/09/20 0958 05/10/20 0242 05/12/20 0506  WBC  --  8.7 6.3 5.1 5.5  NEUTROABS  --  5.8  --   --   --   HGB 14.6 13.7 9.6* 9.7* 10.4*  HCT 43.0 43.9 29.7* 29.7* 31.5*  MCV  --  66.2* 65.6* 63.9* 62.9*  PLT  --  409* 298 295 309   Cardiac Enzymes: No results for input(s): CKTOTAL, CKMB, CKMBINDEX, TROPONINI in the last 168 hours. BNP: Invalid input(s): POCBNP CBG: Recent Labs  Lab 05/08/20 2140  GLUCAP 95   D-Dimer No results for input(s): DDIMER in the last 72 hours. Hgb A1c No results for input(s): HGBA1C in the last 72 hours. Lipid Profile No results for input(s): CHOL, HDL, LDLCALC, TRIG, CHOLHDL, LDLDIRECT in the last 72 hours. Thyroid function studies No results for input(s): TSH, T4TOTAL, T3FREE, THYROIDAB in the last 72 hours.  Invalid input(s): FREET3 Anemia work up No results for input(s): VITAMINB12, FOLATE, FERRITIN, TIBC, IRON, RETICCTPCT in the last 72 hours. Microbiology Recent Results (from the past 240 hour(s))  Blood Culture (routine x 2)     Status: None   Collection Time: 05/08/20 10:30 PM   Specimen: BLOOD  Result Value Ref Range Status   Specimen Description BLOOD RIGHT ANTECUBITAL  Final   Special Requests   Final    BOTTLES DRAWN AEROBIC AND ANAEROBIC Blood Culture adequate  volume   Culture   Final    NO GROWTH 5 DAYS Performed at [facility], [address], [place], [place] [address]    Report Status 05/14/2020 FINAL  Final  Blood Culture (routine x 2)     Status: None   Collection Time: 05/08/20 10:34 PM   Specimen: BLOOD LEFT FOREARM  Result Value Ref Range Status   Specimen Description BLOOD LEFT FOREARM  Final   Special Requests   Final    BOTTLES DRAWN AEROBIC AND ANAEROBIC Blood  Culture adequate volume   Culture   Final    NO GROWTH 5 DAYS Performed at [facility], [place] [address] [place], [place] [address]    Report Status 05/14/2020 FINAL  Final  Respiratory Panel by RT PCR (Flu A&B, Covid) - Nasopharyngeal Swab     Status: None   Collection Time: 05/09/20  1:38 AM   Specimen: Nasopharyngeal Swab; Nasopharyngeal(NP) swabs in vial transport medium  Result Value Ref Range Status   SARS Coronavirus 2 by RT PCR NEGATIVE NEGATIVE Final    Comment: (NOTE) SARS-CoV-2 target nucleic acids are NOT DETECTED.  The SARS-CoV-2 RNA is generally detectable in upper respiratoy specimens during the acute phase of infection. The lowest concentration of SARS-CoV-2 viral copies this assay can detect is 131 copies/mL. A negative result does not preclude SARS-Cov-2 infection and should not be used as the sole basis for treatment or other patient management decisions. A negative result may occur with  improper specimen collection/handling, submission of specimen other than nasopharyngeal swab, presence of viral mutation(s) within the areas targeted by this assay, and inadequate number of viral copies (<131 copies/mL). A negative result must be combined with clinical observations, patient history, and epidemiological information. The expected result is Negative.  Fact Sheet for Patients:  PinkCheek.be  Fact Sheet for Healthcare Providers:  GravelBags.it  This test is no t yet approved  or cleared by the [place] FDA and  has been authorized for detection and/or diagnosis of SARS-CoV-2 by FDA under an Emergency Use Authorization (EUA). This EUA will remain  in effect (meaning this test can be used) for the duration of the COVID-19 declaration under Section 564(b)(1) of the Act, 21 U.S.C. section 360bbb-3(b)(1), unless the authorization is terminated or revoked sooner.     Influenza A by PCR NEGATIVE NEGATIVE Final   Influenza B by PCR NEGATIVE NEGATIVE Final    Comment: (NOTE) The Xpert Xpress SARS-CoV-2/FLU/RSV assay is intended as an aid in  the diagnosis of influenza from Nasopharyngeal swab specimens and  should not be used as a sole basis for treatment. Nasal washings and  aspirates are unacceptable for Xpert Xpress SARS-CoV-2/FLU/RSV  testing.  Fact Sheet for Patients: PinkCheek.be  Fact Sheet for Healthcare Providers: GravelBags.it  This test is not yet approved or cleared by the [place] FDA and  has been authorized for detection and/or diagnosis of SARS-CoV-2 by  FDA under an Emergency Use Authorization (EUA). This EUA will remain  in effect (meaning this test can be used) for the duration of the  Covid-19 declaration under Section 564(b)(1) of the Act, 21  U.S.C. section 360bbb-3(b)(1), unless the authorization is  terminated or revoked. Performed at [facility], [place] [address], [place], [place] [address]   Urine culture     Status: Abnormal   Collection Time: 05/09/20  3:40 AM   Specimen: In/Out Cath Urine  Result Value Ref Range Status   Specimen Description IN/OUT CATH URINE  Final   Special Requests   Final    NONE Performed at [facility], [place] [address], [place], [place] [address]    Culture >=100,000 COLONIES/mL PROTEUS MIRABILIS (A)  Final   Report Status 05/11/2020 FINAL  Final   Organism ID, Bacteria PROTEUS MIRABILIS (A)  Final      Susceptibility    Proteus mirabilis - MIC*    AMPICILLIN <=2 SENSITIVE Sensitive     CEFAZOLIN <=4 SENSITIVE Sensitive     CEFEPIME <=0.12 SENSITIVE Sensitive  CEFTRIAXONE <=0.25 SENSITIVE Sensitive     CIPROFLOXACIN <=0.25 SENSITIVE Sensitive     GENTAMICIN <=1 SENSITIVE Sensitive     IMIPENEM 0.5 SENSITIVE Sensitive     NITROFURANTOIN 128 RESISTANT Resistant     TRIMETH/SULFA <=20 SENSITIVE Sensitive     AMPICILLIN/SULBACTAM <=2 SENSITIVE Sensitive     PIP/TAZO <=4 SENSITIVE Sensitive     * >=100,000 COLONIES/mL PROTEUS MIRABILIS  SARS Coronavirus 2 by RT PCR (hospital order, performed in [place] hospital lab) Nasopharyngeal Nasopharyngeal Swab     Status: None   Collection Time: 05/14/20  3:22 PM   Specimen: Nasopharyngeal Swab  Result Value Ref Range Status   SARS Coronavirus 2 NEGATIVE NEGATIVE Final    Comment: (NOTE) SARS-CoV-2 target nucleic acids are NOT DETECTED.  The SARS-CoV-2 RNA is generally detectable in upper and lower respiratory specimens during the acute phase of infection. The lowest concentration of SARS-CoV-2 viral copies this assay can detect is 250 copies / mL. A negative result does not preclude SARS-CoV-2 infection and should not be used as the sole basis for treatment or other patient management decisions.  A negative result may occur with improper specimen collection / handling, submission of specimen other than nasopharyngeal swab, presence of viral mutation(s) within the areas targeted by this assay, and inadequate number of viral copies (<250 copies / mL). A negative result must be combined with clinical observations, patient history, and epidemiological information.  Fact Sheet for Patients:   StrictlyIdeas.no  Fact Sheet for Healthcare Providers: BankingDealers.co.za  This test is not yet approved or  cleared by the [place] FDA and has been authorized for detection and/or diagnosis of SARS-CoV-2 by FDA  under an Emergency Use Authorization (EUA).  This EUA will remain in effect (meaning this test can be used) for the duration of the COVID-19 declaration under Section 564(b)(1) of the Act, 21 U.S.C. section 360bbb-3(b)(1), unless the authorization is terminated or revoked sooner.  Performed at [facility], [place] [address], [place], [place] [address]      Discharge Instructions:   Discharge Instructions    Diet - low sodium heart healthy   Complete by: As directed    Increase activity slowly   Complete by: As directed      Allergies as of 05/15/2020   No Known Allergies     Medication List    STOP taking these medications   cephALEXin 500 MG capsule Commonly known as: KEFLEX   OVER THE COUNTER MEDICATION   Stool Softener/Laxative 8.6-50 MG tablet Generic drug: senna-docusate     TAKE these medications   feeding supplement Liqd Take 237 mLs by mouth 3 (three) times daily between meals.   ferrous sulfate 325 (65 FE) MG tablet Take 1 tablet (325 mg total) by mouth daily with breakfast.   multivitamin with minerals Tabs tablet Take 1 tablet by mouth daily.   pantoprazole 40 MG tablet Commonly known as: PROTONIX Take 1 tablet (40 mg total) by mouth daily.   polyethylene glycol 17 g packet Commonly known as: MIRALAX / GLYCOLAX Take 17 g by mouth 2 (two) times daily.   tamsulosin 0.4 MG Caps capsule Commonly known as: FLOMAX Take 1 capsule (0.4 mg total) by mouth daily after supper.   Xarelto 20 MG Tabs tablet Generic drug: rivaroxaban TAKE 1 TABLET BY MOUTH ONCE DAILY WITH  SUPPER.       Follow-up Information    [name], NP Follow up in 1 week(s).   Specialty: Internal  Medicine Contact information: [address] [place] [address] [phone]        Jerline Pain, MD .   Specialty: Cardiology Contact information: [phone] [address] [place] [address] [phone]                Time  coordinating discharge: 35 min  Signed:  Geradine Girt DO  [facility] 05/15/2020, 9:56 AM

## 2020-05-15 ENCOUNTER — Encounter (HOSPITAL_COMMUNITY): Payer: Self-pay | Admitting: Internal Medicine

## 2020-05-15 NOTE — Plan of Care (Signed)
  Problem: Clinical Measurements: Goal: Ability to maintain clinical measurements within normal limits will improve Outcome: Progressing   

## 2020-05-15 NOTE — TOC Progression Note (Signed)
Transition of Care [facility]) - Progression Note    Patient Details  Name: Brendan Gadson MRN: [id number] Date of Birth: January 03, 1941  [facility] CM/SW Contact  Zenon Mayo, RN Phone Number: 05/15/2020, 3:39 PM  Clinical Narrative:    NCM gave Helene Kelp Staff RN the 30 day coupon for xarelto for patient.    Expected Discharge Plan: [place] Barriers to Discharge: Continued Medical Work up, Ship broker  Expected Discharge Plan and Services Expected Discharge Plan: [place] In-house Referral: Clinical Social Work     Living arrangements for the past 2 months: Single Family Home Expected Discharge Date: 05/15/20                                     Social Determinants of Health (SDOH) Interventions    Readmission Risk Interventions Readmission Risk Prevention Plan 05/30/2019  Transportation Screening Complete  Medication Review Press photographer) Complete  PCP or Specialist appointment within 3-5 days of discharge Complete  HRI or [place] Complete  SW Recovery Care/Counseling Consult Complete  [place] Not Applicable  Some recent data might be hidden

## 2020-05-15 NOTE — Progress Notes (Signed)
Nutrition Follow-up  DOCUMENTATION CODES:   Severe malnutrition in context of chronic illness  INTERVENTION:   -Continue Ensure Enlive po TID, each supplement provides 350 kcal and 20 grams of protein -Continue MVI with minerals daily  NUTRITION DIAGNOSIS:   Severe Malnutrition related to chronic illness (dementia) as evidenced by moderate muscle depletion, severe muscle depletion, moderate fat depletion, severe fat depletion.  Ongoing  GOAL:   Patient will meet greater than or equal to 90% of their needs  Progressing   MONITOR:   PO intake, Supplement acceptance, Labs, Weight trends, Skin, I & O's  REASON FOR ASSESSMENT:   Low [place]    ASSESSMENT:   Richard [name] is a 79 y.o. 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 Julien Nordmann, paroxysmal A. fib and PE on Xarelto, hypertension, hyperlipidemia, COVID-19 infection in September this year presented to the ED via EMS as code stroke for evaluation of unresponsiveness.  Reviewed I/O's: +257 ml x 24 hours and +1.8 L since admission  UOP: 500 ml x 24 hours  Attempted to speak with pt via call to hospital room phone, however, unable to reach.   Pt remains with good appetite; noted meal completions 25-100%. Pt is consuming Ensure Enlive supplements per MAR.   Per MD notes, pt is medically stable for discharge. Pt awaiting SNF placement.   Labs reviewed.  Diet Order:   Diet Order            Diet - low sodium heart healthy           Diet Heart Room service appropriate? Yes; Fluid consistency: Thin  Diet effective now                 EDUCATION NEEDS:   Education needs have been addressed  Skin:  Skin Assessment: Reviewed RN Assessment  Last BM:  05/09/20  Height:   Ht Readings from Last 1 Encounters:  05/08/20 6\' 3"  (1.905 m)    Weight:   Wt Readings from Last 1 Encounters:  05/15/20 70 kg    Ideal Body Weight:  89.1 kg  BMI:   Body mass index is 19.29 kg/m.  Estimated Nutritional Needs:   Kcal:  2100-2300  Protein:  105-120 grams  Fluid:  > 2 L    Loistine Chance, RD, LDN, [place] Registered Dietitian II Certified Diabetes Care and Education Specialist Please refer to [facility] for RD and/or RD on-call/weekend/after hours pager

## 2020-05-15 NOTE — Progress Notes (Addendum)
Patient pending approval to SNF. D/c done.  No overnight events. Please see d/c summary from 11/29- dates of d/c updated. [name] DO

## 2020-05-16 DIAGNOSIS — A419 Sepsis, unspecified organism: Secondary | ICD-10-CM | POA: Diagnosis not present

## 2020-05-16 NOTE — Progress Notes (Signed)
PROGRESS NOTE  Darnelle Hubbert  DOB: 06-Jan-1941  PCP: [name], NP ZHY:[phone]  DOA: 05/08/2020  LOS: 7 days   Chief Complaint  Patient presents with  . 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 Julien Nordmann, paroxysmal A. fib and PE on Xarelto, hypertension, hyperlipidemia, COVID-19 infection in September this year. Patient presented to ED on 11/23 via EMS as code stroke for evaluation of unresponsiveness. Reportedly patient was in his normal state of health and became suddenly unresponsive at 1700 while having dinner with family.  When EMS arrived, they noticed that he had right gaze deviation, slurred speech, and was hypotensive with blood pressure in the 80s.  Blood pressure improved with IV fluids. History provided by patient very limited given his dementia. He is not sure why he is here. His only complaint is feeling dizzy the day before. Denies cough, shortness of breath, chest pain, nausea, vomiting, abdominal pain, or dysuria. He was admitted to hospital medicine service.  Subjective: Patient was seen and examined this morning. Lying on bed.  Alert, awake.  Does not want to be disturbed.  Assessment/Plan: Severe sepsis due to proteus UTI:  -Hypothermic with temperature 94.3 F on arrival. Tachycardic and tachypneic. Hypotensive with systolic in the low 29B. Lactic acid elevated at 4.0.  -SARS-CoV-2 PCR test and influenza panel negative. Chest x-ray not suggestive of pneumonia.  -u/a suggestive of UTI -culture with proteus -treated for 5 days  Transient altered level of consciousness:  -per neurology: With patients with dementia, spell duration is not always as reliable. Certainly, with his previous stroke, this could explain focal deficits in the setting of hypoperfusion, but could also support a todd's phenomenon. Given the global nature,  I think that stroke/tia much less likely.Workup so far withMRIbrain isneg for acute strokeand no obvious abnormality that could be a seizure focus. EEG neg for seizure activity. + for mild diffuse encephalopathy. -PT eval obtained.  SNF recommended.  Hypothermia: Suspect due to severe sepsis. Placed on Bair hugger in the ED and temperature now improvedoff  Stage III squamous cell carcinoma of the lung: Followed by Dr. Julien Nordmann from oncology.  Paroxysmal A. Fib: -Resume Xarelto  -add BB if able once BP improved  History of PE -Resume Xarelto  Hypertension -BP lower end of normal  Mobility: SNF recommended Code Status:   Code Status: Full Code  Nutritional status: Body mass index is 19.78 kg/m. Nutrition Problem: Severe Malnutrition Etiology: chronic illness (dementia) Signs/Symptoms: moderate muscle depletion, severe muscle depletion, moderate fat depletion, severe fat depletion Diet Order            Diet - low sodium heart healthy           Diet Heart Room service appropriate? Yes; Fluid consistency: Thin  Diet effective now                 DVT prophylaxis: Place TED hose Start: 05/13/20 1123 rivaroxaban (XARELTO) tablet 20 mg   Antimicrobials:  Finished the course Fluid: None Consultants: None Family Communication:  None at bedside  Pending placement  Infusions:    Scheduled Meds: . feeding supplement  237 mL Oral TID BM  . multivitamin with minerals  1 tablet Oral Daily  . rivaroxaban  20 mg Oral Q supper  . tamsulosin  0.4 mg Oral QPC supper    Antimicrobials: Anti-infectives (From admission, onward)   Start  Dose/Rate Route Frequency Ordered Stop   05/13/20 2200  cephALEXin (KEFLEX) capsule 500 mg        500 mg Oral Every 12 hours 05/13/20 1531 05/14/20 0801   05/10/20 1500  cefTRIAXone (ROCEPHIN) 1 g in sodium chloride 0.9 % 100 mL IVPB  Status:  Discontinued        1 g 200 mL/hr over 30 Minutes Intravenous Every 24 hours 05/10/20  1348 05/13/20 1531   05/09/20 1800  vancomycin (VANCOREADY) IVPB 750 mg/150 mL  Status:  Discontinued        750 mg 150 mL/hr over 60 Minutes Intravenous Every 12 hours 05/09/20 0242 05/10/20 1347   05/09/20 0600  piperacillin-tazobactam (ZOSYN) IVPB 3.375 g  Status:  Discontinued        3.375 g 12.5 mL/hr over 240 Minutes Intravenous Every 8 hours 05/09/20 0243 05/10/20 1348   05/08/20 2330  vancomycin (VANCOREADY) IVPB 1500 mg/300 mL        1,500 mg 150 mL/hr over 120 Minutes Intravenous  Once 05/08/20 2317 05/09/20 0544   05/08/20 2315  piperacillin-tazobactam (ZOSYN) IVPB 3.375 g  Status:  Discontinued        3.375 g 12.5 mL/hr over 240 Minutes Intravenous Every 12 hours 05/08/20 2311 05/09/20 0243   05/08/20 2315  vancomycin (VANCOCIN) IVPB 1000 mg/200 mL premix  Status:  Discontinued        1,000 mg 200 mL/hr over 60 Minutes Intravenous  Once 05/08/20 2311 05/08/20 2317      PRN meds: acetaminophen **OR** acetaminophen, bisacodyl, polyethylene glycol   Objective: Vitals:   05/15/20 2022 05/16/20 0555  BP: 105/80 103/65  Pulse: 99 89  Resp: 18 20  Temp: 98.1 F (36.7 C) (!) 97.3 F (36.3 C)  SpO2: 100% 100%    Intake/Output Summary (Last 24 hours) at 05/16/2020 1257 Last data filed at 05/16/2020 1119 Gross per 24 hour  Intake 1677 ml  Output 700 ml  Net 977 ml   Filed Weights   05/14/20 0354 05/15/20 0427 05/16/20 0555  Weight: 70.6 kg 70 kg 71.8 kg   Weight change: 1.8 kg Body mass index is 19.78 kg/m.   Physical Exam: General exam: Not in physical distress Skin: No rashes, lesions or ulcers. HEENT: Atraumatic, normocephalic, no obvious bleeding Lungs: Clear to auscultation bilaterally CVS: Regular rate and rhythm, no murmur GI/Abd soft, nontender, nondistended, bowel sound present CNS: Alert, awake, oriented to place Psychiatry: Depressed look Extremities: No pedal edema, no calf tenderness  Data Review: I have personally reviewed the laboratory data  and studies available.  Recent Labs  Lab 05/10/20 0242 05/12/20 0506  WBC 5.1 5.5  HGB 9.7* 10.4*  HCT 29.7* 31.5*  MCV 63.9* 62.9*  PLT 295 309   Recent Labs  Lab 05/10/20 0242 05/12/20 0506  NA 142 137  K 3.7 4.5  CL 109 106  CO2 22 22  GLUCOSE 80 93  BUN 12 12  CREATININE 0.95 0.73  CALCIUM 9.2 9.4    F/u labs ordered  Signed, Terrilee Croak, MD [facility] 05/16/2020

## 2020-05-16 NOTE — Plan of Care (Signed)
  Problem: Education: Goal: Knowledge of General Education information will improve Description: Including pain rating scale, medication(s)/side effects and non-pharmacologic comfort measures Outcome: Progressing   Problem: Health Behavior/Discharge Planning: Goal: Ability to manage health-related needs will improve Outcome: Progressing   Problem: Activity: Goal: Risk for activity intolerance will decrease Outcome: Progressing   

## 2020-05-16 NOTE — Progress Notes (Signed)
   05/16/20 1403  Assess: MEWS Score  Temp 98.8 F (37.1 C)  BP 99/67  Pulse Rate 98  Resp 20  SpO2 100 %  O2 Device Room Air  Assess: MEWS Score  MEWS Temp 0  MEWS Systolic 1  MEWS Pulse 0  MEWS RR 0  MEWS LOC 0  MEWS Score 1  MEWS Score Color Green  Assess: if the MEWS score is Yellow or Red  Were vital signs taken at a resting state? Yes  Focused Assessment No change from prior assessment  Early Detection of Sepsis Score *See Row Information* Low  MEWS guidelines implemented *See Row Information* No, vital signs rechecked  Treat  Pain Scale 0-10  Pain Score 0  Take Vital Signs  Increase Vital Sign Frequency   (n/a )  Notify: Charge Nurse/RN  Name of Charge Nurse/RN Notified  (n/a)  Notify: Provider  Provider Name/Title  (n/a)  Document  Patient Outcome  (VS re-checked)  MEWS score turned yellow due to HR, VS rechecked, VS back at patient baseline. MEWS green. Will continue to monitor.

## 2020-05-17 DIAGNOSIS — A419 Sepsis, unspecified organism: Secondary | ICD-10-CM | POA: Diagnosis not present

## 2020-05-17 NOTE — TOC Progression Note (Signed)
Transition of Care [facility]) - Progression Note    Patient Details  Name: Richard [name] MRN: [id number] Date of Birth: 1940/07/18  Transition of Care [facility]) CM/SW [place], [place] Phone Number: 05/17/2020, 1:19 PM  Clinical Narrative:    CSW received phone call from SNF, stated that auth should be granted hopefully by this evening or tomorrow, stated representative started auth but didn't submit it. Auth started and expedited. SNF requested updated therapy notes, h&p, and med list. Sent therapy notes in [place], faxed all other notes.    Expected Discharge Plan: [place] Barriers to Discharge: Continued Medical Work up, Ship broker  Expected Discharge Plan and Services Expected Discharge Plan: [place] In-house Referral: Clinical Social Work     Living arrangements for the past 2 months: Single Family Home Expected Discharge Date: 05/15/20                                     Social Determinants of Health (SDOH) Interventions    Readmission Risk Interventions Readmission Risk Prevention Plan 05/30/2019  Transportation Screening Complete  Medication Review Press photographer) Complete  PCP or Specialist appointment within 3-5 days of discharge Complete  HRI or [place] Complete  SW Recovery Care/Counseling Consult Complete  [place] Not Applicable  Some recent data might be hidden

## 2020-05-17 NOTE — TOC Progression Note (Signed)
Transition of Care [facility]) - Progression Note    Patient Details  Name: Kasai Beltran MRN: [id number] Date of Birth: June 28, 1940  Transition of Care [facility]) CM/SW [place], [place] Phone Number: 05/17/2020, 12:12 PM  Clinical Narrative:    CSW checked in with [facility], still waiting on insurance auth with [employer].    Expected Discharge Plan: [place] Barriers to Discharge: Continued Medical Work up, Ship broker  Expected Discharge Plan and Services Expected Discharge Plan: [place] In-house Referral: Clinical Social Work     Living arrangements for the past 2 months: Single Family Home Expected Discharge Date: 05/15/20                                     Social Determinants of Health (SDOH) Interventions    Readmission Risk Interventions Readmission Risk Prevention Plan 05/30/2019  Transportation Screening Complete  Medication Review Press photographer) Complete  PCP or Specialist appointment within 3-5 days of discharge Complete  HRI or [place] Complete  SW Recovery Care/Counseling Consult Complete  [place] Not Applicable  Some recent data might be hidden

## 2020-05-17 NOTE — Progress Notes (Signed)
Physical Therapy Treatment Patient Details Name: Richard [name] MRN: [id number] DOB: September 24, 1940 Today's Date: 05/17/2020    History of Present Illness 79 yo male wtih onset of unresponsiveness at home with family was brought to ED for tx.  Note his UTI with encephalopathy, R pleural effusion and dementia.  PMHx:  ventriculomegaly, stage 3 lung CA, respiratory failure, ischemic white matter changes on MRI, a-fib, PE, L THA with pain    PT Comments    Patient agitated this session with explicit language throughout, daughter present and encouraging movement. Patient minA for bed mobility with trunk to upright and bringing B LEs back onto bed. Patient modA+2 for sit to stand x 3 with STEDY, difficulty with upright standing due to B knee flexion contractures. Repositioned in bed in semi-chair position to prevent patient lying on side with flexed LEs for long periods of time. Patient continues to be limited by pain, decreased activity tolerance, generalized weakness, B knee flexion contractures, impaired balance, and impaired functional mobility. Continue to recommend SNF for ongoing Physical Therapy.      Follow Up Recommendations  SNF     Equipment Recommendations  None recommended by PT    Recommendations for Other Services       Precautions / Restrictions Precautions Precautions: Fall Precaution Comments: B knee flexion contractures,  Restrictions Weight Bearing Restrictions: No Other Position/Activity Restrictions: contractures of knees    Mobility  Bed Mobility Overal bed mobility: Needs Assistance Bed Mobility: Supine to Sit;Sit to Supine     Supine to sit: Min assist Sit to supine: Min assist   General bed mobility comments: MinA for trunk to upright and assist for LE's back into bed  Transfers Overall transfer level: Needs assistance Equipment used: Ambulation equipment used Transfers: Sit to/from Stand Sit to Stand: Mod assist;+2 physical assistance          General transfer comment: ModA + 2 to rise to stand from edge of bed x 3  Ambulation/Gait                 Stairs             Wheelchair Mobility    Modified Rankin (Stroke Patients Only)       Balance Overall balance assessment: Needs assistance Sitting-balance support: Feet supported Sitting balance-Leahy Scale: Fair     Standing balance support: Bilateral upper extremity supported;During functional activity Standing balance-Leahy Scale: Poor Standing balance comment: reliant on external support                            Cognition Arousal/Alertness: Awake/alert Behavior During Therapy: Flat affect Overall Cognitive Status: History of cognitive impairments - at baseline                                 General Comments: History of dementia      Exercises      General Comments General comments (skin integrity, edema, etc.): daughter present. Patient with explicit language throughout session       Pertinent Vitals/Pain Pain Assessment: Faces Faces Pain Scale: Hurts little more Pain Location: back Pain Descriptors / Indicators: Grimacing;Guarding Pain Intervention(s): Monitored during session;Repositioned    Home Living                      Prior Function            PT  Goals (current goals can now be found in the care plan section) Acute Rehab PT Goals Patient Stated Goal: go home PT Goal Formulation: With patient/family Time For Goal Achievement: 05/23/20 Potential to Achieve Goals: Fair Progress towards PT goals: Progressing toward goals    Frequency    Min 2X/week      PT Plan Current plan remains appropriate    Co-evaluation              AM-PAC PT "6 Clicks" Mobility   Outcome Measure  Help needed turning from your back to your side while in a flat bed without using bedrails?: A Little Help needed moving from lying on your back to sitting on the side of a flat bed without using  bedrails?: A Little Help needed moving to and from a bed to a chair (including a wheelchair)?: Total Help needed standing up from a chair using your arms (e.g., wheelchair or bedside chair)?: A Lot Help needed to walk in hospital room?: Total Help needed climbing 3-5 steps with a railing? : Total 6 Click Score: 11    End of Session Equipment Utilized During Treatment: Gait belt Activity Tolerance: Patient limited by pain Patient left: in bed;with call bell/phone within reach;with bed alarm set;with family/visitor present Nurse Communication: Mobility status PT Visit Diagnosis: Unsteadiness on feet (R26.81);Muscle weakness (generalized) (M62.81);Difficulty in walking, not elsewhere classified (R26.2);Pain     Time: 0932-1000 PT Time Calculation (min) (ACUTE ONLY): 28 min  Charges:  $Therapeutic Activity: 23-37 mins                     Perrin Maltese, PT, DPT Acute Rehabilitation Services Pager [phone] Office [phone]    Melene Plan Allred 05/17/2020, 10:45 AM

## 2020-05-17 NOTE — Progress Notes (Signed)
PROGRESS NOTE  Richard [name]  DOB: 1941/03/26  PCP: Richard Perna, NP AJO:[id number]  DOA: 05/08/2020  LOS: 8 days   Chief Complaint  Patient presents with  . 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 Julien [name], paroxysmal A. fib and PE on Xarelto, hypertension, hyperlipidemia, COVID-19 infection in September this year. Patient presented to ED on 11/23 via EMS as code stroke for evaluation of unresponsiveness. Reportedly patient was in his normal state of health and became suddenly unresponsive at 1700 while having dinner with family.  When EMS arrived, they noticed that he had right gaze deviation, slurred speech, and was hypotensive with blood pressure in the 80s.  Blood pressure improved with IV fluids. History provided by patient very limited given his dementia. He is not sure why he is here. His only complaint is feeling dizzy the day before. Denies cough, shortness of breath, chest pain, nausea, vomiting, abdominal pain, or dysuria. He was admitted to hospital medicine service.  Subjective: Patient was seen and examined this morning. Elderly demented patient. Not in distress. No new symptoms. Waiting for placement.  Assessment/Plan: Severe sepsis due to proteus UTI:  -Presented with tachycardia, tachypnea, hypotension with systolic blood pressure in low 70s, hypothermia with temperature 94.3 F and lactic acid elevated to 4. -SARS-CoV-2 PCR test and influenza panel negative. Chest x-ray not suggestive of pneumonia.  -Urine culture grew Proteus. Treated with 5 days of antibiotics. -Sepsis parameters resolved.  Transient altered level of consciousness:  -per neurology: With patients with dementia, spell duration is not always as reliable. Certainly, with his previous stroke, this could explain focal deficits in the setting of hypoperfusion, but  could also support a todd's phenomenon. Given the global nature, I think that stroke/tia much less likely.Workup so far withMRIbrain isneg for acute strokeand no obvious abnormality that could be a seizure focus. EEG neg for seizure activity. + for mild diffuse encephalopathy. -PT eval obtained.  SNF recommended.  Stage III squamous cell carcinoma of the lung: Followed by Dr. Julien [name] from oncology.  Paroxysmal A. Fib: -Resume Xarelto  -add BB if able once BP improved  History of PE -Resume Xarelto  Mobility: SNF recommended Code Status:   Code Status: Full Code  Nutritional status: Body mass index is 19.45 kg/m. Nutrition Problem: Severe Malnutrition Etiology: chronic illness (dementia) Signs/Symptoms: moderate muscle depletion, severe muscle depletion, moderate fat depletion, severe fat depletion Diet Order            Diet - low sodium heart healthy           Diet Heart Room service appropriate? Yes; Fluid consistency: Thin  Diet effective now                 DVT prophylaxis: Place TED hose Start: 05/13/20 1123 rivaroxaban (XARELTO) tablet 20 mg   Antimicrobials:  Finished the course of antibiotics Fluid: None Consultants: None Family Communication:  None at bedside  Pending placement  Infusions:    Scheduled Meds: . feeding supplement  237 mL Oral TID BM  . multivitamin with minerals  1 tablet Oral Daily  . rivaroxaban  20 mg Oral Q supper  . tamsulosin  0.4 mg Oral QPC supper    Antimicrobials: Anti-infectives (From admission, onward)   Start     Dose/Rate Route Frequency Ordered Stop   05/13/20 2200  cephALEXin (KEFLEX) capsule 500 mg  500 mg Oral Every 12 hours 05/13/20 1531 05/14/20 0801   05/10/20 1500  cefTRIAXone (ROCEPHIN) 1 g in sodium chloride 0.9 % 100 mL IVPB  Status:  Discontinued        1 g 200 mL/hr over 30 Minutes Intravenous Every 24 hours 05/10/20 1348 05/13/20 1531   05/09/20 1800  vancomycin (VANCOREADY) IVPB 750 mg/150  mL  Status:  Discontinued        750 mg 150 mL/hr over 60 Minutes Intravenous Every 12 hours 05/09/20 0242 05/10/20 1347   05/09/20 0600  piperacillin-tazobactam (ZOSYN) IVPB 3.375 g  Status:  Discontinued        3.375 g 12.5 mL/hr over 240 Minutes Intravenous Every 8 hours 05/09/20 0243 05/10/20 1348   05/08/20 2330  vancomycin (VANCOREADY) IVPB 1500 mg/300 mL        1,500 mg 150 mL/hr over 120 Minutes Intravenous  Once 05/08/20 2317 05/09/20 0544   05/08/20 2315  piperacillin-tazobactam (ZOSYN) IVPB 3.375 g  Status:  Discontinued        3.375 g 12.5 mL/hr over 240 Minutes Intravenous Every 12 hours 05/08/20 2311 05/09/20 0243   05/08/20 2315  vancomycin (VANCOCIN) IVPB 1000 mg/200 mL premix  Status:  Discontinued        1,000 mg 200 mL/hr over 60 Minutes Intravenous  Once 05/08/20 2311 05/08/20 2317      PRN meds: acetaminophen **OR** acetaminophen, bisacodyl, polyethylene glycol   Objective: Vitals:   05/17/20 1138 05/17/20 1651  BP: 120/72 108/65  Pulse: 96 (!) 105  Resp: 20 18  Temp: 97.8 F (36.6 C) 97.7 F (36.5 C)  SpO2: 100% 99%    Intake/Output Summary (Last 24 hours) at 05/17/2020 1721 Last data filed at 05/17/2020 1654 Gross per 24 hour  Intake 1197 ml  Output 450 ml  Net 747 ml   Filed Weights   05/15/20 0427 05/16/20 0555 05/17/20 0237  Weight: 70 kg 71.8 kg 70.6 kg   Weight change: -1.2 kg Body mass index is 19.45 kg/m.   Physical Exam: General exam: Not in physical distress. Skin: No rashes, lesions or ulcers. HEENT: Atraumatic, normocephalic, no obvious bleeding Lungs: Clear to auscultation bilaterally CVS: Regular rate and rhythm, no murmur GI/Abd soft, nontender, nondistended, bowel sound present CNS: Alert, awake, oriented to place. Psychiatry: Depressed look Extremities: No pedal edema, no calf tenderness  Data Review: I have personally reviewed the laboratory data and studies available.  Recent Labs  Lab 05/12/20 0506  WBC 5.5  HGB  10.4*  HCT 31.5*  MCV 62.9*  PLT 309   Recent Labs  Lab 05/12/20 0506  NA 137  K 4.5  CL 106  CO2 22  GLUCOSE 93  BUN 12  CREATININE 0.73  CALCIUM 9.4    F/u labs ordered  Signed, Terrilee Croak, MD [facility] 05/17/2020

## 2020-05-18 MED ORDER — TAMSULOSIN HCL 0.4 MG PO CAPS: 0.4000 mg | ORAL_CAPSULE | Freq: Every day | ORAL | 0 refills | Status: AC

## 2020-05-18 NOTE — Plan of Care (Signed)
  Problem: Education: Goal: Knowledge of General Education information will improve Description: Including pain rating scale, medication(s)/side effects and non-pharmacologic comfort measures Outcome: Adequate for Discharge   Problem: Health Behavior/Discharge Planning: Goal: Ability to manage health-related needs will improve Outcome: Adequate for Discharge   Problem: Clinical Measurements: Goal: Ability to maintain clinical measurements within normal limits will improve Outcome: Adequate for Discharge Goal: Will remain free from infection Outcome: Adequate for Discharge Goal: Diagnostic test results will improve Outcome: Adequate for Discharge Goal: Respiratory complications will improve Outcome: Adequate for Discharge Goal: Cardiovascular complication will be avoided Outcome: Adequate for Discharge   Problem: Activity: Goal: Risk for activity intolerance will decrease Outcome: Adequate for Discharge   Problem: Nutrition: Goal: Adequate nutrition will be maintained Outcome: Adequate for Discharge   Problem: Coping: Goal: Level of anxiety will decrease Outcome: Adequate for Discharge   Problem: Elimination: Goal: Will not experience complications related to bowel motility Outcome: Adequate for Discharge   Problem: Safety: Goal: Ability to remain free from injury will improve Outcome: Adequate for Discharge   Problem: Skin Integrity: Goal: Risk for impaired skin integrity will decrease Outcome: Adequate for Discharge

## 2020-05-18 NOTE — TOC Progression Note (Signed)
Transition of Care [facility]) - Progression Note    Patient Details  Name: Richard [name] MRN: [id number] Date of Birth: 09-03-1940  Transition of Care [facility]) CM/SW [place], [place] Phone Number: 05/18/2020, 11:32 AM  Clinical Narrative:    Still waiting on auth from SNF. Per SNF, they are working with a supervisor to expedite approval. There is a chance that SNF will not be able to hold the bed much longer.    Expected Discharge Plan: [place] Barriers to Discharge: Continued Medical Work up, Ship broker  Expected Discharge Plan and Services Expected Discharge Plan: [place] In-house Referral: Clinical Social Work     Living arrangements for the past 2 months: Single Family Home Expected Discharge Date: 05/15/20                                     Social Determinants of Health (SDOH) Interventions    Readmission Risk Interventions Readmission Risk Prevention Plan 05/30/2019  Transportation Screening Complete  Medication Review Press photographer) Complete  PCP or Specialist appointment within 3-5 days of discharge Complete  HRI or [place] Complete  SW Recovery Care/Counseling Consult Complete  [place] Not Applicable  Some recent data might be hidden

## 2020-05-18 NOTE — Progress Notes (Signed)
D/C instructions printed and placed in packet at nurse's station. Awaiting PTAR.

## 2020-05-18 NOTE — Discharge Summary (Addendum)
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 Julien Nordmann, paroxysmal A. fib and PE on Xarelto, hypertension, hyperlipidemia, COVID-19 infection in September this year. Patient presented to ED on 11/23 via EMS as code stroke for evaluation of unresponsiveness. Reportedly patient was in his normal state of health and became suddenly unresponsive at 1700 while having dinner with family.  When EMS arrived, they noticed that he had right gaze deviation, slurred speech, and was hypotensive with blood pressure in the 80s.  Blood pressure improved with IV fluids. History provided by patient very limited given his dementia. He is not sure why he was here. His only complaint was feeling dizzy the day before.  He was admitted to hospital medicine service. He was managed for severe sepsis secondary to Proteus UTI. SNF was recommended.  Subjective:  Patient was seen and examined this morning. Elderly demented patient. Not in distress. No new symptoms. Waiting for placement.  Hospital Course:  Severe sepsis due to proteus UTI:  -Presented with tachycardia, tachypnea, hypotension with systolic blood pressure in low 70s, hypothermia with temperature 94.3 F and lactic acid elevated to  4. -SARS-CoV-2 PCR test and influenza panel negative. Chest x-ray not suggestive of pneumonia.  -Urine culture grew Proteus. Treated with 5 days of antibiotics. -Sepsis parameters resolved.  Transient altered level of consciousness:  -per neurology: With patients with dementia, spell duration is not always as reliable. Certainly, with his previous stroke, this could explain focal deficits in the setting of hypoperfusion, but could also support a todd's phenomenon. Given the global nature, I think that stroke/tia much less likely.Workup so far withMRIbrain isneg for acute strokeand no obvious abnormality that could be a seizure focus. EEG neg for seizure activity. + for mild diffuse encephalopathy. -PT eval obtained.  SNF recommended.  Stage III squamous cell carcinoma of the lung: Followed by Dr. Julien Nordmann from oncology.  Paroxysmal A. Fib Hypertension -Continue Xarelto -Blood pressure remains on the lower side of normal.  Not on blood pressure medicines.  History of PE -Continue Xarelto  Okay to discharge home with home health, RN, PT, aide Wound care: Pressure Injury 05/19/19 Hip Left;Lower Stage II -  Partial thickness loss of dermis presenting as a shallow open ulcer with a red, pink wound bed without slough. Stage II present with scab over it (Active)  Date First Assessed/Time First Assessed: 05/19/19 0348   Location: Hip  Location Orientation: Left;Lower  Staging: Stage II -  Partial thickness loss of dermis presenting as a shallow open ulcer with a red, pink wound bed without slough.  Wound Descri...    Assessments 05/19/2019  3:25 AM 05/30/2019  8:00 AM  Dressing Type Foam - Lift dressing to assess site every shift Foam - Lift dressing to assess site every shift  Dressing Changed Clean;Dry;Intact  Dressing Change Frequency -- Every  3 days  State of Healing -- Early/partial granulation  Site / Wound Assessment Yellow;Pink;Dry Clean;Pink  Peri-wound Assessment Erythema  (non-blanchable) --  Wound Length (cm) 2.5 cm --  Wound Width (cm) 2.5 cm --  Wound Depth (cm) 0 cm --  Wound Surface Area (cm^2) 6.25 cm^2 --  Wound Volume (cm^3) 0 cm^3 --  Drainage Amount None --  Treatment Cleansed --     No Linked orders to display     Pressure Injury 05/19/19 Hip Right;Left Stage I -  Intact skin with non-blanchable redness of a localized area usually over a bony prominence. (Active)  Date First Assessed/Time First Assessed: 05/19/19 0355   Location: Hip  Location Orientation: Right;Left  Staging: Stage I -  Intact skin with non-blanchable redness of a localized area usually over a bony prominence.    Assessments 05/19/2019  3:25 AM 11/05/2019 10:45 AM  Dressing Type Foam - Lift dressing to assess site every shift Foam - Lift dressing to assess site every shift  Dressing Changed --  Site / Wound Assessment Clean;Dry --  Peri-wound Assessment Erythema (non-blanchable) --  Drainage Amount None None  Treatment Cleansed --     No Linked orders to display     Incision (Closed) 09/24/19 Abdomen Left;Anterior (Active)  Date First Assessed/Time First Assessed: 09/24/19 2020   Location: Abdomen  Location Orientation: Left;Anterior  Present on Admission: Yes    Assessments 09/24/2019  8:20 PM 09/26/2019  9:00 PM  Dressing Type Gauze (Comment);Tape dressing Abdominal pads  Dressing Clean;Dry;Intact Clean;Dry;Intact  Dressing Change Frequency PRN --  Site / Wound Assessment Dressing in place / Unable to assess --  Drainage Amount None --     No Linked orders to display    Discharge Exam:   Vitals:   05/17/20 2244 05/18/20 0000 05/18/20 1129 05/18/20 1130  BP: 111/61 (!) 93/52 (!) 91/56   Pulse: 100 100 (!) 110 97  Resp: 16 16 20    Temp: 97.8 F (36.6 C) 98.2 F (36.8 C) 97.8 F (36.6 C)   TempSrc: Oral Oral Oral   SpO2: 98% 98% 99%   Weight:      Height:        Body mass index is 19.45 kg/m.  General exam: Not in physical distress Skin: No rashes,  lesions or ulcers. HEENT: Atraumatic, normocephalic, no obvious bleeding Lungs: Clear to auscultation bilaterally CVS: Regular rate and rhythm, no murmur GI/Abd soft, nontender, nondistended, bowel sound present CNS: Alert, awake, oriented to place.  Not restless or agitated. Psychiatry: Depressed look Extremities: No pedal edema, no calf tenderness  Follow ups:   Discharge Instructions    Diet - low sodium heart healthy   Complete by: As directed    Increase activity slowly   Complete by: As directed       Follow-up Information    [name], NP Follow up in 1 week(s).   Specialty: Internal Medicine Contact information: [place] [address] [phone]        Jerline Pain, MD .   Specialty: Cardiology Contact information: [phone] [address] [place] Follow up.   Why: wheelchair Contact information: [address] [place] [address] [place], [place] Follow up.   Specialty: Home Health Services Why: [facility], HHPT,HHAIDE Contact information: [place] [address] [place] [address]  [phone]               Recommendations for Outpatient Follow-Up:   1. Follow-up with PCP as an outpatient  Discharge Instructions:  Follow with Primary MD [name], NP in 7 days   Get CBC/BMP checked in next visit within 1 week by PCP or SNF MD ( we routinely change or add medications that can affect your baseline labs and fluid status, therefore we recommend that you get the mentioned basic workup next visit with your PCP, your PCP may decide not to get them or add new tests based on their clinical decision)  On your next visit with your PCP, please Get Medicines reviewed and adjusted.  Please request your PCP  to go over all Hospital Tests and Procedure/Radiological results at the follow up, please get all  Hospital records sent to your Prim MD by signing hospital release before you go home.  Activity: As tolerated with Full fall precautions use walker/cane & assistance as needed  For Heart failure patients - Check your Weight same time everyday, if you gain over 2 pounds, or you develop in leg swelling, experience more shortness of breath or chest pain, call your Primary MD immediately. Follow Cardiac Low Salt Diet and 1.5 lit/day fluid restriction.  If you have smoked or chewed Tobacco in the last 2 yrs please stop smoking, stop any regular Alcohol  and or any Recreational drug use.  If you experience worsening of your admission symptoms, develop shortness of breath, life threatening emergency, suicidal or homicidal thoughts you must seek medical attention immediately by calling 911 or calling your MD immediately  if symptoms less severe.  You Must read complete instructions/literature along with all the possible adverse reactions/side effects for all the Medicines you take and that have been prescribed to you. Take any new Medicines after you have completely understood and accpet all the possible adverse reactions/side effects.   Do not drive, operate heavy machinery, perform activities at heights, swimming or participation in water activities or provide baby sitting services if your were admitted for syncope or siezures until you have seen by Primary MD or a Neurologist and advised to do so again.  Do not drive when taking Pain medications.  Do not take more than prescribed Pain, Sleep and Anxiety Medications  Wear Seat belts while driving.   Please note You were cared for by a hospitalist during your hospital stay. If you have any questions about your discharge medications or the care you received while you were in the hospital after you are discharged, you can call the unit and asked to speak with the hospitalist on call if the hospitalist that took care of you is not available. Once you are  discharged, your primary care physician will handle any further medical issues. Please note that NO REFILLS for any discharge medications will be authorized once you are discharged, as it is imperative that you return to your primary care physician (or establish a relationship with a primary care physician if you do not have one) for your aftercare needs so that they can reassess your need for medications and monitor your lab values.    Allergies as of 05/18/2020   No Known Allergies     Medication List    STOP taking these medications   cephALEXin 500 MG capsule Commonly known as: KEFLEX   ferrous sulfate 325 (65 FE) MG tablet   OVER THE COUNTER MEDICATION   pantoprazole 40 MG tablet Commonly  known as: PROTONIX   polyethylene glycol 17 g packet Commonly known as: MIRALAX / GLYCOLAX   Stool Softener/Laxative 8.6-50 MG tablet Generic drug: senna-docusate     TAKE these medications   feeding supplement Liqd Take 237 mLs by mouth 3 (three) times daily between meals.   multivitamin with minerals Tabs tablet Take 1 tablet by mouth daily.   tamsulosin 0.4 MG Caps capsule Commonly known as: FLOMAX Take 1 capsule (0.4 mg total) by mouth daily after supper.   Xarelto 20 MG Tabs tablet Generic drug: rivaroxaban TAKE 1 TABLET BY MOUTH ONCE DAILY WITH  SUPPER.            Durable Medical Equipment  (From admission, onward)         Start     Ordered   05/18/20 1426  For home use only DME lightweight manual wheelchair with seat cushion  Once       Comments: Patient suffers from  weakness which impairs their ability to perform daily activities like bathing and dressing  in the home.  A walker or cane will not resolve  issue with performing activities of daily living. A wheelchair will allow patient to safely perform daily activities. Patient is not able to propel themselves in the home using a standard weight wheelchair due to weakness. Patient can self propel in the  lightweight wheelchair. Length of need lifetime. Accessories: elevating leg rests (ELRs), wheel locks, extensions and anti-tippers.   05/18/20 1427          Time coordinating discharge: 35 minutes  The results of significant diagnostics from this hospitalization (including imaging, microbiology, ancillary and laboratory) are listed below for reference.    Procedures and Diagnostic Studies:   MR BRAIN WO CONTRAST  Result Date: 05/09/2020 CLINICAL DATA:  Syncope, simple, abnormal neuro exam. Code stroke with right-sided gaze. The examination had to be discontinued prior to completion due to pain. Axial coronal diffusion weighted images were performed. Sagittal T1 weighted images were performed. EXAM: MRI HEAD WITHOUT CONTRAST TECHNIQUE: Multiplanar, multiecho pulse sequences of the brain and surrounding structures were obtained without intravenous contrast. COMPARISON:  CT head without contrast 05/08/2020. MR head without contrast 01/04/2019 FINDINGS: Brain: The diffusion-weighted images demonstrate no acute or subacute infarction. Moderate generalized atrophy is present. Lateral ventricles and third ventricle are dilated. Fourth ventricle is of normal size. There is thinning of the corpus callosum, stable. No focal abnormality is present. The brainstem and cerebellum are within normal limits. Vascular: Not assessed Skull and upper cervical spine: Distorted by motion Sinuses/Orbits: Sinuses are grossly clear. Orbits are not well assessed by the limited study. IMPRESSION: 1. No acute intracranial abnormality or significant interval change. 2. Moderate generalized atrophy. This likely reflects the sequela of chronic microvascular ischemia. Electronically Signed   By: San Morelle M.D.   On: 05/09/2020 07:00   CT ABDOMEN PELVIS W CONTRAST  Result Date: 05/09/2020 CLINICAL DATA:  Acute abdominal pain. Patient was unresponsive at dinner but now all ir to self. History of blood clots and  cancer. EXAM: CT ABDOMEN AND PELVIS WITH CONTRAST TECHNIQUE: Multidetector CT imaging of the abdomen and pelvis was performed using the standard protocol following bolus administration of intravenous contrast. CONTRAST:  157mL OMNIPAQUE IOHEXOL 300 MG/ML  SOLN COMPARISON:  01/31/2020 FINDINGS: Lower chest: Small right pleural effusion with patchy infiltration or scarring in the right lung base, unchanged since prior study. 6 mm nodule in the left lung base is unchanged since prior study. Volume loss in the  right lung. Hepatobiliary: No focal liver abnormality is seen. No gallstones, gallbladder wall thickening, or biliary dilatation. Pancreas: Unremarkable. No pancreatic ductal dilatation or surrounding inflammatory changes. Spleen: Normal in size without focal abnormality. Adrenals/Urinary Tract: Adrenal glands are unremarkable. Kidneys are normal, without renal calculi, focal lesion, or hydronephrosis. Bladder wall is diffusely thickened, possibly due to chronic outlet obstruction or cystitis. Layering debris in the dependent bladder may represent evidence of hemorrhage or infection. Stomach/Bowel: Stomach is fluid-filled without abnormal distention or wall thickening. Small bowel are decompressed. Stool fills the colon with particular prominence of rectal stool. No wall thickening or inflammatory changes are demonstrated. Appendix is normal. Vascular/Lymphatic: Aortic atherosclerosis. No enlarged abdominal or pelvic lymph nodes. Reproductive: Lobular enlargement of the prostate gland, impressing on the bladder base. Other: No free air or free fluid in the abdomen. Abdominal wall musculature appears intact. Musculoskeletal: Postoperative left hip arthroplasty. Degenerative changes in the spine. No destructive bone lesions. IMPRESSION: 1. No acute process demonstrated in the abdomen or pelvis. 2. Small right pleural effusion with patchy infiltration or scarring in the right lung base, unchanged since prior study. 6  mm nodule in the left lung base is unchanged since prior study. 3. Lobular enlargement of the prostate gland, impressing on the bladder base. 4. Bladder wall is diffusely thickened, possibly due to chronic outlet obstruction or cystitis. Layering debris in the dependent bladder may represent evidence of hemorrhage or infection. Correlation with urinalysis recommended. 5. Stool fills the colon with particular prominence of rectal stool. No evidence of bowel obstruction. 6. Aortic atherosclerosis. Aortic Atherosclerosis (ICD10-I70.0). Electronically Signed   By: Lucienne Capers M.D.   On: 05/09/2020 00:07   DG Chest Port 1 View  Result Date: 05/09/2020 CLINICAL DATA:  Sepsis. EXAM: PORTABLE CHEST 1 VIEW COMPARISON:  02/16/2020 FINDINGS: There are airspace opacities in the right lower lung zone and right mid lung zone. No pneumothorax. There is elevation of the right hemidiaphragm. Emphysematous changes are noted. There is right-sided volume loss. There is no acute displaced fracture. There is a small right-sided pleural effusion. The heart size appears unremarkable. IMPRESSION: 1. Small right-sided pleural effusion. 2. Again noted are post treatment changes in the right lung zone likely related to the patient's prior history of lung cancer. Electronically Signed   By: Constance Holster M.D.   On: 05/09/2020 01:07   EEG adult  Result Date: 05/09/2020 Lora Havens, MD     05/09/2020  9:56 AM Patient Name: Octavio Matheney MRN: [id number] Epilepsy Attending: Lora Havens Referring Physician/Provider: Dr. Shela Leff Date: 05/09/2020 Duration: 23.44 mins Patient history: 79 year old male with brief period of sudden unresponsiveness. EEG to evaluate for seizures. Level of alertness: Awake AEDs during EEG study: None Technical aspects: This EEG study was done with scalp electrodes positioned according to the 10-20 International system of electrode placement. Electrical activity was acquired at a  sampling rate of 500Hz  and reviewed with a high frequency filter of 70Hz  and a low frequency filter of 1Hz . EEG data were recorded continuously and digitally stored. Description: The posterior dominant rhythm consists of 8 Hz activity of moderate voltage (25-35 uV) seen predominantly in posterior head regions, symmetric and reactive to eye opening and eye closing. EEG showed continuous generalized 5 to 6 Hz theta as well as intermittent generalized 2-3Hz  delta slowing. Hyperventilation and photic stimulation were not performed.   ABNORMALITY -Continuous slow, generalized IMPRESSION: This study is suggestive of mild diffuse encephalopathy, nonspecific etiology. No seizures or epileptiform discharges were seen  throughout the recording. Lora Havens   ECHOCARDIOGRAM COMPLETE  Result Date: 05/09/2020    ECHOCARDIOGRAM REPORT   Patient Name:   [facility] Date of Exam: 05/09/2020 Medical Rec #:  [id number]    Height:       75.0 in Accession #:    [id number]   Weight:       160.1 lb Date of Birth:  [birth year]    BSA:          1.996 m Patient Age:    53 years     BP:           123/74 mmHg Patient Gender: M            HR:           105 bpm. Exam Location:  Inpatient Procedure: 2D Echo Indications:    Syncope 780.2 / R55  History:        Patient has prior history of Echocardiogram examinations, most                 recent 11/05/2019. Risk Factors:Hypertension and Dyslipidemia.                 Squamous cell carcinoma of the lung. History of paroxysmal A                 Fib. Acute respiratory failure with hypoxia. Severe sepsis                 secondary to UTI.  Sonographer:    Darlina Sicilian RDCS Referring Phys: [id number] [place]  1. Left ventricular ejection fraction, by estimation, is 55 to 60%. The left ventricle has normal function. The left ventricle has no regional wall motion abnormalities. Left ventricular diastolic parameters are indeterminate.  2. Right ventricular systolic function is  normal. The right ventricular size is normal. There is normal pulmonary artery systolic pressure.  3. The mitral valve is normal in structure. Trivial mitral valve regurgitation. No evidence of mitral stenosis.  4. The aortic valve is grossly normal. Aortic valve regurgitation is not visualized.  5. Pulmonic valve regurgitation not assessed.  6. The inferior vena cava is normal in size with greater than 50% respiratory variability, suggesting right atrial pressure of 3 mmHg. Comparison(s): No significant change from prior study. Conclusion(s)/Recommendation(s): Limited windows--no parasternal or apical views, study done subcostal. FINDINGS  Left Ventricle: Left ventricular ejection fraction, by estimation, is 55 to 60%. The left ventricle has normal function. The left ventricle has no regional wall motion abnormalities. The left ventricular internal cavity size was normal in size. There is  no left ventricular hypertrophy. Left ventricular diastolic parameters are indeterminate. Right Ventricle: The right ventricular size is normal. No increase in right ventricular wall thickness. Right ventricular systolic function is normal. There is normal pulmonary artery systolic pressure. The tricuspid regurgitant velocity is 1.83 m/s, and  with an assumed right atrial pressure of 3 mmHg, the estimated right ventricular systolic pressure is 76.1 mmHg. Left Atrium: Left atrial size was normal in size. Right Atrium: Right atrial size was normal in size. Pericardium: There is no evidence of pericardial effusion. Mitral Valve: The mitral valve is normal in structure. Trivial mitral valve regurgitation. No evidence of mitral valve stenosis. Tricuspid Valve: The tricuspid valve is normal in structure. Tricuspid valve regurgitation is trivial. No evidence of tricuspid stenosis. Aortic Valve: The aortic valve is grossly normal. Aortic valve regurgitation is not visualized. Pulmonic Valve: The pulmonic valve was not well  visualized.  Pulmonic valve regurgitation not assessed. Aorta: The aortic root was not well visualized and the ascending aorta was not well visualized. Venous: The inferior vena cava is normal in size with greater than 50% respiratory variability, suggesting right atrial pressure of 3 mmHg. IAS/Shunts: The atrial septum is grossly normal.  LEFT VENTRICLE PLAX 2D LVIDd:         3.45 cm     Diastology LVIDs:         2.70 cm     LV e' medial:    6.20 cm/s LV PW:         0.90 cm     LV E/e' medial:  9.0 LV IVS:        1.10 cm     LV e' lateral:   5.33 cm/s LVOT diam:     2.20 cm     LV E/e' lateral: 10.5 LV SV:         45 LV SV Index:   23 LVOT Area:     3.80 cm  LV Volumes (MOD) LV vol d, MOD A2C: 77.6 ml LV vol d, MOD A4C: 85.1 ml LV vol s, MOD A2C: 26.9 ml LV vol s, MOD A4C: 55.4 ml LV SV MOD A2C:     50.7 ml LV SV MOD A4C:     85.1 ml LV SV MOD BP:      43.7 ml RIGHT VENTRICLE RV S prime:     7.62 cm/s LEFT ATRIUM             Index       RIGHT ATRIUM           Index LA diam:        2.10 cm 1.05 cm/m  RA Area:     10.20 cm LA Vol (A2C):   21.1 ml 10.57 ml/m RA Volume:   21.90 ml  10.97 ml/m LA Vol (A4C):   30.6 ml 15.33 ml/m LA Biplane Vol: 26.1 ml 13.08 ml/m  AORTIC VALVE LVOT Vmax:   64.60 cm/s LVOT Vmean:  49.900 cm/s LVOT VTI:    0.119 m  AORTA Ao Root diam: 3.20 cm MITRAL VALVE               TRICUSPID VALVE MV Area (PHT): 7.99 cm    TR Peak grad:   13.4 mmHg MV Decel Time: 95 msec     TR Vmax:        183.00 cm/s MV E velocity: 55.70 cm/s MV A velocity: 77.60 cm/s  SHUNTS MV E/A ratio:  0.72        Systemic VTI:  0.12 m                            Systemic Diam: 2.20 cm Buford Dresser MD Electronically signed by Buford Dresser MD Signature Date/Time: 05/09/2020/5:27:30 PM    Final    DG Hip Unilat W or Wo Pelvis 2-3 Views Right  Result Date: 05/09/2020 CLINICAL DATA:  Pain EXAM: DG HIP (WITH OR WITHOUT PELVIS) 2-3V RIGHT COMPARISON:  CT from same day FINDINGS: There are advanced degenerative changes  of the right hip without evidence for an acute displaced fracture or dislocation. The patient is status post total hip arthroplasty on the left. IMPRESSION: Advanced degenerative changes of the right hip without evidence for an acute displaced fracture or dislocation. Electronically Signed   By: Constance Holster M.D.   On: 05/09/2020 01:10  CT HEAD CODE STROKE WO CONTRAST  Result Date: 05/08/2020 CLINICAL DATA:  Code stroke.  Right facial droop EXAM: CT HEAD WITHOUT CONTRAST TECHNIQUE: Contiguous axial images were obtained from the base of the skull through the vertex without intravenous contrast. COMPARISON:  11/04/2019 FINDINGS: Brain: There is no mass, hemorrhage or extra-axial collection. Severe ventriculomegaly, unchanged. There is hypoattenuation of the periventricular white matter, most commonly indicating chronic ischemic microangiopathy. Old right occipital infarct. Vascular: No abnormal hyperdensity of the major intracranial arteries or dural venous sinuses. No intracranial atherosclerosis. Skull: The visualized skull base, calvarium and extracranial soft tissues are normal. Sinuses/Orbits: No fluid levels or advanced mucosal thickening of the visualized paranasal sinuses. No mastoid or middle ear effusion. The orbits are normal. ASPECTS [facility] Stroke Program Early CT Score) - Ganglionic level infarction (caudate, lentiform nuclei, internal capsule, insula, M1-M3 cortex): 7 - Supraganglionic infarction (M4-M6 cortex): 3 Total score (0-10 with 10 being normal): 10 IMPRESSION: 1. No acute intracranial abnormality. 2. ASPECTS is 10. 3. 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 These results were communicated to Dr. Roland Rack at 9:57 pm on 05/08/2020 by text page via the [facility] messaging system. Electronically Signed   By: Ulyses Jarred M.D.   On: 05/08/2020 21:57     Labs:   Basic Metabolic Panel: Recent Labs  Lab 05/12/20 0506  NA 137  K 4.5  CL  106  CO2 22  GLUCOSE 93  BUN 12  CREATININE 0.73  CALCIUM 9.4   GFR Estimated Creatinine Clearance: 77.2 mL/min (by C-G formula based on SCr of 0.73 mg/dL). Liver Function Tests: No results for input(s): AST, ALT, ALKPHOS, BILITOT, PROT, ALBUMIN in the last 168 hours. No results for input(s): LIPASE, AMYLASE in the last 168 hours. No results for input(s): AMMONIA in the last 168 hours. Coagulation profile No results for input(s): INR, PROTIME in the last 168 hours.  CBC: Recent Labs  Lab 05/12/20 0506  WBC 5.5  HGB 10.4*  HCT 31.5*  MCV 62.9*  PLT 309   Cardiac Enzymes: No results for input(s): CKTOTAL, CKMB, CKMBINDEX, TROPONINI in the last 168 hours. BNP: Invalid input(s): POCBNP CBG: No results for input(s): GLUCAP in the last 168 hours. D-Dimer No results for input(s): DDIMER in the last 72 hours. Hgb A1c No results for input(s): HGBA1C in the last 72 hours. Lipid Profile No results for input(s): CHOL, HDL, LDLCALC, TRIG, CHOLHDL, LDLDIRECT in the last 72 hours. Thyroid function studies No results for input(s): TSH, T4TOTAL, T3FREE, THYROIDAB in the last 72 hours.  Invalid input(s): FREET3 Anemia work up No results for input(s): VITAMINB12, FOLATE, FERRITIN, TIBC, IRON, RETICCTPCT in the last 72 hours. Microbiology Recent Results (from the past 240 hour(s))  Blood Culture (routine x 2)     Status: None   Collection Time: 05/08/20 10:30 PM   Specimen: BLOOD  Result Value Ref Range Status   Specimen Description BLOOD RIGHT ANTECUBITAL  Final   Special Requests   Final    BOTTLES DRAWN AEROBIC AND ANAEROBIC Blood Culture adequate volume   Culture   Final    NO GROWTH 5 DAYS Performed at [facility], [address], [place], [place] [address]    Report Status 05/14/2020 FINAL  Final  Blood Culture (routine x 2)     Status: None   Collection Time: 05/08/20 10:34 PM   Specimen: BLOOD LEFT FOREARM  Result Value Ref Range Status   Specimen Description  BLOOD LEFT FOREARM  Final  Special Requests   Final    BOTTLES DRAWN AEROBIC AND ANAEROBIC Blood Culture adequate volume   Culture   Final    NO GROWTH 5 DAYS Performed at [facility], [place] [address] [place], [place] [address]    Report Status 05/14/2020 FINAL  Final  Respiratory Panel by RT PCR (Flu A&B, Covid) - Nasopharyngeal Swab     Status: None   Collection Time: 05/09/20  1:38 AM   Specimen: Nasopharyngeal Swab; Nasopharyngeal(NP) swabs in vial transport medium  Result Value Ref Range Status   SARS Coronavirus 2 by RT PCR NEGATIVE NEGATIVE Final    Comment: (NOTE) SARS-CoV-2 target nucleic acids are NOT DETECTED.  The SARS-CoV-2 RNA is generally detectable in upper respiratoy specimens during the acute phase of infection. The lowest concentration of SARS-CoV-2 viral copies this assay can detect is 131 copies/mL. A negative result does not preclude SARS-Cov-2 infection and should not be used as the sole basis for treatment or other patient management decisions. A negative result may occur with  improper specimen collection/handling, submission of specimen other than nasopharyngeal swab, presence of viral mutation(s) within the areas targeted by this assay, and inadequate number of viral copies (<131 copies/mL). A negative result must be combined with clinical observations, patient history, and epidemiological information. The expected result is Negative.  Fact Sheet for Patients:  PinkCheek.be  Fact Sheet for Healthcare Providers:  GravelBags.it  This test is no t yet approved or cleared by the [place] FDA and  has been authorized for detection and/or diagnosis of SARS-CoV-2 by FDA under an Emergency Use Authorization (EUA). This EUA will remain  in effect (meaning this test can be used) for the duration of the COVID-19 declaration under Section 564(b)(1) of the Act, 21 U.S.C. section 360bbb-3(b)(1),  unless the authorization is terminated or revoked sooner.     Influenza A by PCR NEGATIVE NEGATIVE Final   Influenza B by PCR NEGATIVE NEGATIVE Final    Comment: (NOTE) The Xpert Xpress SARS-CoV-2/FLU/RSV assay is intended as an aid in  the diagnosis of influenza from Nasopharyngeal swab specimens and  should not be used as a sole basis for treatment. Nasal washings and  aspirates are unacceptable for Xpert Xpress SARS-CoV-2/FLU/RSV  testing.  Fact Sheet for Patients: PinkCheek.be  Fact Sheet for Healthcare Providers: GravelBags.it  This test is not yet approved or cleared by the [place] FDA and  has been authorized for detection and/or diagnosis of SARS-CoV-2 by  FDA under an Emergency Use Authorization (EUA). This EUA will remain  in effect (meaning this test can be used) for the duration of the  Covid-19 declaration under Section 564(b)(1) of the Act, 21  U.S.C. section 360bbb-3(b)(1), unless the authorization is  terminated or revoked. Performed at [facility], [place] [address] [place], [place] [address]   Urine culture     Status: Abnormal   Collection Time: 05/09/20  3:40 AM   Specimen: In/Out Cath Urine  Result Value Ref Range Status   Specimen Description IN/OUT CATH URINE  Final   Special Requests   Final    NONE Performed at [facility], [place] [address], [place],  [address]    Culture >=100,000 COLONIES/mL PROTEUS MIRABILIS (A)  Final   Report Status 05/11/2020 FINAL  Final   Organism ID, Bacteria PROTEUS MIRABILIS (A)  Final      Susceptibility   Proteus mirabilis - MIC*    AMPICILLIN <=2 SENSITIVE Sensitive  CEFAZOLIN <=4 SENSITIVE Sensitive     CEFEPIME <=0.12 SENSITIVE Sensitive     CEFTRIAXONE <=0.25 SENSITIVE Sensitive     CIPROFLOXACIN <=0.25 SENSITIVE Sensitive     GENTAMICIN <=1 SENSITIVE Sensitive     IMIPENEM 0.5 SENSITIVE Sensitive     NITROFURANTOIN 128  RESISTANT Resistant     TRIMETH/SULFA <=20 SENSITIVE Sensitive     AMPICILLIN/SULBACTAM <=2 SENSITIVE Sensitive     PIP/TAZO <=4 SENSITIVE Sensitive     * >=100,000 COLONIES/mL PROTEUS MIRABILIS  SARS Coronavirus 2 by RT PCR (hospital order, performed in [place] hospital lab) Nasopharyngeal Nasopharyngeal Swab     Status: None   Collection Time: 05/14/20  3:22 PM   Specimen: Nasopharyngeal Swab  Result Value Ref Range Status   SARS Coronavirus 2 NEGATIVE NEGATIVE Final    Comment: (NOTE) SARS-CoV-2 target nucleic acids are NOT DETECTED.  The SARS-CoV-2 RNA is generally detectable in upper and lower respiratory specimens during the acute phase of infection. The lowest concentration of SARS-CoV-2 viral copies this assay can detect is 250 copies / mL. A negative result does not preclude SARS-CoV-2 infection and should not be used as the sole basis for treatment or other patient management decisions.  A negative result may occur with improper specimen collection / handling, submission of specimen other than nasopharyngeal swab, presence of viral mutation(s) within the areas targeted by this assay, and inadequate number of viral copies (<250 copies / mL). A negative result must be combined with clinical observations, patient history, and epidemiological information.  Fact Sheet for Patients:   StrictlyIdeas.no  Fact Sheet for Healthcare Providers: BankingDealers.co.za  This test is not yet approved or  cleared by the [place] FDA and has been authorized for detection and/or diagnosis of SARS-CoV-2 by FDA under an Emergency Use Authorization (EUA).  This EUA will remain in effect (meaning this test can be used) for the duration of the COVID-19 declaration under Section 564(b)(1) of the Act, 21 U.S.C. section 360bbb-3(b)(1), unless the authorization is terminated or revoked sooner.  Performed at [facility], [place]  [address], [place], [place] [address]      Signed: Terrilee Croak  [facility] 05/18/2020, 3:09 PM

## 2020-05-18 NOTE — TOC Transition Note (Signed)
Transition of Care [facility]) - CM/SW Discharge Note   Patient Details  Name: Richard [name] MRN: [id number] Date of Birth: [birth year]  Transition of Care [facility]) CM/SW Contact:  Zenon Mayo, RN Phone Number: 05/18/2020, 2:46 PM   Clinical Narrative:    Per CSW patient is going home insurance denied SNF,  NCM contacted daughter she states she will be with patient 24 hrs and she will be taking care of him.  She states he needs a light weight w/chair, NCM ordered this for him to be delivered to the home.  He will need ambulance transport also.  NCM offered choice to daughter for [facility], HHPT, [facility], she states she does not have a preference.  NCM made referral to [name] with [facility].   [name] is able to take referral. Soc will begin 24 to 48 hrs post dc.    Final next level of care: Home w Home Health Services Barriers to Discharge: No Barriers Identified   Patient Goals and CMS Choice   CMS Medicare.gov Compare Post Acute Care list provided to:: Patient Represenative (must comment) Choice offered to / list presented to : Adult Children  Discharge Placement                       Discharge Plan and Services In-house Referral: Clinical Social Work              DME Arranged: Youth worker wheelchair with seat cushion DME Agency: [employer] Date DME Agency Contacted: 05/18/20 Time DME Agency Contacted: [id number] Representative spoke with at DME Agency: [place]: RN, PT, Nurse's Aide [place] Agency: [facility] Date [place]: 05/18/20 Time [place]: [place] Representative spoke with at [place]: [place] ([place]) Interventions     Readmission Risk Interventions Readmission Risk Prevention Plan 05/30/2019  Transportation Screening Complete  Medication Review Press photographer) Complete  PCP or Specialist appointment within 3-5 days of discharge Complete  HRI or [place] Complete  SW Recovery  Care/Counseling Consult Complete  [place] Not Applicable  Some recent data might be hidden

## 2020-05-18 NOTE — Progress Notes (Signed)
PROGRESS NOTE  Richard [name]  DOB: April 09, 1941  PCP: Richard Perna, NP SWN:[id number]  DOA: 05/08/2020  LOS: 9 days   Chief Complaint  Patient presents with  . 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 Julien [name], paroxysmal A. fib and PE on Xarelto, hypertension, hyperlipidemia, COVID-19 infection in September this year. Patient presented to ED on 11/23 via EMS as code stroke for evaluation of unresponsiveness. Reportedly patient was in his normal state of health and became suddenly unresponsive at 1700 while having dinner with family.  When EMS arrived, they noticed that he had right gaze deviation, slurred speech, and was hypotensive with blood pressure in the 80s.  Blood pressure improved with IV fluids. History provided by patient very limited given his dementia. He is not sure why he was here. His only complaint was feeling dizzy the day before.  He was admitted to hospital medicine service. He was managed for severe sepsis secondary to Proteus UTI. SNF was recommended.  Subjective: Patient was seen and examined this morning. Elderly demented patient. Not in distress. No new symptoms. Waiting for placement.  Assessment/Plan: Severe sepsis due to proteus UTI:  -Presented with tachycardia, tachypnea, hypotension with systolic blood pressure in low 70s, hypothermia with temperature 94.3 F and lactic acid elevated to 4. -SARS-CoV-2 PCR test and influenza panel negative. Chest x-ray not suggestive of pneumonia.  -Urine culture grew Proteus. Treated with 5 days of antibiotics. -Sepsis parameters resolved.  Transient altered level of consciousness:  -per neurology: With patients with dementia, spell duration is not always as reliable. Certainly, with his previous stroke, this could explain focal deficits in the setting of hypoperfusion, but could also  support a todd's phenomenon. Given the global nature, I think that stroke/tia much less likely.Workup so far withMRIbrain isneg for acute strokeand no obvious abnormality that could be a seizure focus. EEG neg for seizure activity. + for mild diffuse encephalopathy. -PT eval obtained.  SNF recommended.  Stage III squamous cell carcinoma of the lung: Followed by Dr. Julien [name] from oncology.  Paroxysmal A. Fib: -Resume Xarelto  -add BB if able once BP improved.  Blood pressure remains low.  I would continue to hold beta-blocker.  History of PE -Resume Xarelto  Mobility: SNF recommended Code Status:   Code Status: Full Code  Nutritional status: Body mass index is 19.45 kg/m. Nutrition Problem: Severe Malnutrition Etiology: chronic illness (dementia) Signs/Symptoms: moderate muscle depletion, severe muscle depletion, moderate fat depletion, severe fat depletion Diet Order            Diet - low sodium heart healthy           Diet Heart Room service appropriate? Yes; Fluid consistency: Thin  Diet effective now                 DVT prophylaxis: Place TED hose Start: 05/13/20 1123 rivaroxaban (XARELTO) tablet 20 mg   Antimicrobials:  Finished the course of antibiotics Fluid: None Consultants: None Family Communication:  None at bedside  Medically stable for discharge.  Pending SNF authorization.  It seems that insurance denied SNF placement.  Social worker is going to discuss with family about home with home health care.  Infusions:    Scheduled Meds: . feeding supplement  237 mL Oral TID BM  . multivitamin with minerals  1 tablet Oral Daily  . rivaroxaban  20 mg Oral Q supper  .  tamsulosin  0.4 mg Oral QPC supper    Antimicrobials: Anti-infectives (From admission, onward)   Start     Dose/Rate Route Frequency Ordered Stop   05/13/20 2200  cephALEXin (KEFLEX) capsule 500 mg        500 mg Oral Every 12 hours 05/13/20 1531 05/14/20 0801   05/10/20 1500   cefTRIAXone (ROCEPHIN) 1 g in sodium chloride 0.9 % 100 mL IVPB  Status:  Discontinued        1 g 200 mL/hr over 30 Minutes Intravenous Every 24 hours 05/10/20 1348 05/13/20 1531   05/09/20 1800  vancomycin (VANCOREADY) IVPB 750 mg/150 mL  Status:  Discontinued        750 mg 150 mL/hr over 60 Minutes Intravenous Every 12 hours 05/09/20 0242 05/10/20 1347   05/09/20 0600  piperacillin-tazobactam (ZOSYN) IVPB 3.375 g  Status:  Discontinued        3.375 g 12.5 mL/hr over 240 Minutes Intravenous Every 8 hours 05/09/20 0243 05/10/20 1348   05/08/20 2330  vancomycin (VANCOREADY) IVPB 1500 mg/300 mL        1,500 mg 150 mL/hr over 120 Minutes Intravenous  Once 05/08/20 2317 05/09/20 0544   05/08/20 2315  piperacillin-tazobactam (ZOSYN) IVPB 3.375 g  Status:  Discontinued        3.375 g 12.5 mL/hr over 240 Minutes Intravenous Every 12 hours 05/08/20 2311 05/09/20 0243   05/08/20 2315  vancomycin (VANCOCIN) IVPB 1000 mg/200 mL premix  Status:  Discontinued        1,000 mg 200 mL/hr over 60 Minutes Intravenous  Once 05/08/20 2311 05/08/20 2317      PRN meds: acetaminophen **OR** acetaminophen, bisacodyl, polyethylene glycol   Objective: Vitals:   05/18/20 1129 05/18/20 1130  BP: (!) 91/56   Pulse: (!) 110 97  Resp: 20   Temp: 97.8 F (36.6 C)   SpO2: 99%     Intake/Output Summary (Last 24 hours) at 05/18/2020 1402 Last data filed at 05/18/2020 1254 Gross per 24 hour  Intake 780 ml  Output 950 ml  Net -170 ml   Filed Weights   05/15/20 0427 05/16/20 0555 05/17/20 0237  Weight: 70 kg 71.8 kg 70.6 kg   Weight change:  Body mass index is 19.45 kg/m.   Physical Exam: General exam: Not in physical distress Skin: No rashes, lesions or ulcers. HEENT: Atraumatic, normocephalic, no obvious bleeding Lungs: Clear to auscultation bilaterally CVS: Regular rate and rhythm, no murmur GI/Abd soft, nontender, nondistended, bowel sound present CNS: Alert, awake, oriented to place.  Not  restless or agitated. Psychiatry: Depressed look Extremities: No pedal edema, no calf tenderness  Data Review: I have personally reviewed the laboratory data and studies available.  Recent Labs  Lab 05/12/20 0506  WBC 5.5  HGB 10.4*  HCT 31.5*  MCV 62.9*  PLT 309   Recent Labs  Lab 05/12/20 0506  NA 137  K 4.5  CL 106  CO2 22  GLUCOSE 93  BUN 12  CREATININE 0.73  CALCIUM 9.4    F/u labs ordered  Signed, Terrilee Croak, MD [facility] 05/18/2020

## 2020-05-18 NOTE — Care Management Important Message (Signed)
Important Message  Patient Details  Name: Cederic Mozley MRN: [id number] Date of Birth: 1941-03-19   Medicare Important Message Given:  Yes     Barb Merino Elouise Divelbiss 05/18/2020, 2:55 PM

## 2020-05-18 NOTE — TOC Progression Note (Signed)
Transition of Care [facility]) - Progression Note    Patient Details  Name: Dontavis Tschantz MRN: [id number] Date of Birth: March 18, 1941  Transition of Care [facility]) CM/SW [place], [place] Phone Number: 05/18/2020, 2:15 PM  Clinical Narrative:    CSW received phone call from SNF stating insurance company denying pt admission into SNF. MD made aware but declined to do peer to peer. CSW reached out to pt's daughter to relay updates. Pt will be going home with HH. [facility] will be called.    Expected Discharge Plan: [place] Barriers to Discharge: Continued Medical Work up, Ship broker  Expected Discharge Plan and Services Expected Discharge Plan: [place] In-house Referral: Clinical Social Work     Living arrangements for the past 2 months: Single Family Home Expected Discharge Date: 05/15/20                                     Social Determinants of Health (SDOH) Interventions    Readmission Risk Interventions Readmission Risk Prevention Plan 05/30/2019  Transportation Screening Complete  Medication Review Press photographer) Complete  PCP or Specialist appointment within 3-5 days of discharge Complete  HRI or [place] Complete  SW Recovery Care/Counseling Consult Complete  [place] Not Applicable  Some recent data might be hidden

## 2020-05-18 NOTE — Progress Notes (Signed)
    Durable Medical Equipment  (From admission, onward)         Start     Ordered   05/18/20 1426  For home use only DME lightweight manual wheelchair with seat cushion  Once       Comments: Patient suffers from  weakness which impairs their ability to perform daily activities like bathing and dressing  in the home.  A walker or cane will not resolve  issue with performing activities of daily living. A wheelchair will allow patient to safely perform daily activities. Patient is not able to propel themselves in the home using a standard weight wheelchair due to weakness. Patient can self propel in the lightweight wheelchair. Length of need lifetime. Accessories: elevating leg rests (ELRs), wheel locks, extensions and anti-tippers.   05/18/20 1427

## 2020-05-19 NOTE — Progress Notes (Signed)
Richard [name], daughter of patient had concerns about xarelto prescription. Patient wanted a more affordable blood thinner prior to discharge and patient has no more of current xarelto pills. Richard Costain, MD to notify.

## 2020-05-19 NOTE — Progress Notes (Signed)
Spoke to Dahal MD about patient's xarelto refill. MD will reach out to the family.

## 2020-05-19 NOTE — Progress Notes (Signed)
EMS arrived to transport patient home at [phone]. Final set of vitals obtained, IV previously discontinued, and all belongings sent with patient.

## 2020-05-21 ENCOUNTER — Telehealth: Payer: Self-pay | Admitting: *Deleted

## 2020-05-21 ENCOUNTER — Other Ambulatory Visit: Payer: Self-pay

## 2020-05-21 ENCOUNTER — Telehealth: Payer: Self-pay

## 2020-05-21 ENCOUNTER — Other Ambulatory Visit: Payer: Self-pay | Admitting: Internal Medicine

## 2020-05-21 DIAGNOSIS — C3491 Malignant neoplasm of unspecified part of right bronchus or lung: Secondary | ICD-10-CM | POA: Diagnosis not present

## 2020-05-21 DIAGNOSIS — N390 Urinary tract infection, site not specified: Secondary | ICD-10-CM | POA: Diagnosis not present

## 2020-05-21 DIAGNOSIS — I480 Paroxysmal atrial fibrillation: Secondary | ICD-10-CM | POA: Diagnosis not present

## 2020-05-21 DIAGNOSIS — I10 Essential (primary) hypertension: Secondary | ICD-10-CM | POA: Diagnosis not present

## 2020-05-21 DIAGNOSIS — Z86711 Personal history of pulmonary embolism: Secondary | ICD-10-CM | POA: Diagnosis not present

## 2020-05-21 DIAGNOSIS — R69 Illness, unspecified: Secondary | ICD-10-CM | POA: Diagnosis not present

## 2020-05-21 DIAGNOSIS — Z87891 Personal history of nicotine dependence: Secondary | ICD-10-CM | POA: Diagnosis not present

## 2020-05-21 DIAGNOSIS — Z7901 Long term (current) use of anticoagulants: Secondary | ICD-10-CM | POA: Diagnosis not present

## 2020-05-21 DIAGNOSIS — Z8616 Personal history of COVID-19: Secondary | ICD-10-CM | POA: Diagnosis not present

## 2020-05-21 DIAGNOSIS — E785 Hyperlipidemia, unspecified: Secondary | ICD-10-CM | POA: Diagnosis not present

## 2020-05-21 MED ORDER — APIXABAN 5 MG PO TABS: 5.0000 mg | ORAL_TABLET | Freq: Two times a day (BID) | ORAL | 1 refills | Status: DC

## 2020-05-21 NOTE — Telephone Encounter (Signed)
Transition Care Management Follow-up Telephone Call  Call completed with patient's sister, [name].  Date of discharge and from where: 05/18/2020, [facility]  How have you been since you were released from the hospital? She said that he is doing okay - eating and responding.  Any questions or concerns? No   She said his insurance is changing in January 2022.  She could not remember what company. The person who assisted her told her that a medicaid application would be submitted at the same time.   His income is $1071/month so she is not sure that he will qualify for full medicaid.   Items Reviewed:  Did the pt receive and understand the discharge instructions provided? Yes   Medications obtained and verified? [name] said she needs to pick up the blood thinner and "water pill"  and hopes to do so today.   Other? No   Any new allergies since your discharge? No   Do you have support at home? Yes , he lives with his daughter, [facility] and Equipment/Supplies: Were home health services ordered? yes If so, what is the name of the agency? [facility] Has the agency set up a time to come to the patient's home? Yes, the nurse came to the house today Were any new equipment or medical supplies ordered?  Yes: wheelchair What is the name of the medical supply agency? [employer] [facility] Were you able to get the supplies/equipment? yes Do you have any questions related to the use of the equipment or supplies? No   Already has walker and BSC.   Functional Questionnaire: (I = Independent and D = Dependent) ADLs: daughter provides all needed assistance.  He is not standing. Incontinent bowels/baldder at times.  Discussed the option of CAP services and she said she would speak to the home health SW about that.   Follow up appointments reviewed:   PCP Hospital f/u appt confirmed? [name] said she would call to schedule an appointment   [facility] f/u appt confirmed? yes, oncology  06/01/2020 and 06/04/2020.    Transportation needed? he has been using [place] accompanies him however, he is now wheelchair bounnd and there are 3 steps to enter the home. no ramp so she is not sure how she will get him out of the house by herself.  Instructed her to call his insurance company to inquire about other transportation options.   If their condition worsens, is the pt aware to call PCP or go to the Emergency Dept.? Yes  Was the patient provided with contact information for the PCP's office or ED? Yes, she has the phone number for [facility]  Was to pt encouraged to call back with questions or concerns?yes

## 2020-05-21 NOTE — Telephone Encounter (Signed)
Richard [name]'s daughter states when he was discharged from the hospital he was given a free trial of Eliquis. Pharmacy is requesting a prescription be sent to [facility].   Previous notes indicate that he could not afford Xarelto

## 2020-05-21 NOTE — Telephone Encounter (Signed)
Prescription sent for Eliquis.  Hopefully he can afford this one.  Thank you.

## 2020-05-24 ENCOUNTER — Other Ambulatory Visit: Payer: Self-pay

## 2020-05-24 NOTE — Patient Outreach (Signed)
[facility] [facility]) Care Management  05/24/2020  Richard [name] [birth year] [id number]   New referral: Diagnosis:  Sepsis /uti MD office does transition of care.  Placed call to patient and spoke with daughter Richard [name] who states she has guardianship of patient.  Reviewed reason for call.  Explained [facility] services as a free benefit for patient.  Daughter reports to me that she understands patients medications and he is taking his medications as prescribed.  Reviewed insurance decline of SNF placement.  Confirmed patient is active with home health nursing and social worker. Daughter reports to me pressure ulcer is healed.  Daughter reports she has follow up scheduled and patient has [employer] for transportation.  Daughter reports her only need is help with housing.  Reports she has section 8 and is looking for a 3 bedroom.   Offer to place a referral for [facility] social worker but daughter declined. States she has a Education officer, museum coming to see her today to assist with housing.  Offered to follow up with patient and daughter but she states to me that she has everything under control.  Denies needing [facility] services today. I offered to mail a letter with my contact information in case she changes her mind and she has agreed.  Confirmed address.   PLAN: daughter denies needs at this time. Home health nurse active Home health social worker active [employer] in place. Section 8 in place.  Will mail letter and close case at this time.  Tomasa Rand, RN, BSN, CEN [facility] [employer] [phone]

## 2020-05-25 DIAGNOSIS — I10 Essential (primary) hypertension: Secondary | ICD-10-CM | POA: Diagnosis not present

## 2020-05-25 DIAGNOSIS — R69 Illness, unspecified: Secondary | ICD-10-CM | POA: Diagnosis not present

## 2020-05-25 DIAGNOSIS — Z86711 Personal history of pulmonary embolism: Secondary | ICD-10-CM | POA: Diagnosis not present

## 2020-05-25 DIAGNOSIS — N390 Urinary tract infection, site not specified: Secondary | ICD-10-CM | POA: Diagnosis not present

## 2020-05-25 DIAGNOSIS — Z7901 Long term (current) use of anticoagulants: Secondary | ICD-10-CM | POA: Diagnosis not present

## 2020-05-25 DIAGNOSIS — C3491 Malignant neoplasm of unspecified part of right bronchus or lung: Secondary | ICD-10-CM | POA: Diagnosis not present

## 2020-05-25 DIAGNOSIS — Z87891 Personal history of nicotine dependence: Secondary | ICD-10-CM | POA: Diagnosis not present

## 2020-05-25 DIAGNOSIS — E785 Hyperlipidemia, unspecified: Secondary | ICD-10-CM | POA: Diagnosis not present

## 2020-05-25 DIAGNOSIS — I480 Paroxysmal atrial fibrillation: Secondary | ICD-10-CM | POA: Diagnosis not present

## 2020-05-25 DIAGNOSIS — Z8616 Personal history of COVID-19: Secondary | ICD-10-CM | POA: Diagnosis not present

## 2020-06-01 ENCOUNTER — Other Ambulatory Visit: Payer: Self-pay

## 2020-06-01 ENCOUNTER — Ambulatory Visit (HOSPITAL_COMMUNITY): Payer: Medicare HMO

## 2020-06-01 ENCOUNTER — Inpatient Hospital Stay: Payer: Medicare HMO | Attending: Primary Care

## 2020-06-01 ENCOUNTER — Emergency Department (HOSPITAL_COMMUNITY): Payer: Medicare HMO

## 2020-06-01 ENCOUNTER — Telehealth: Payer: Self-pay

## 2020-06-01 ENCOUNTER — Emergency Department (HOSPITAL_COMMUNITY)
Admission: EM | Admit: 2020-06-01 | Discharge: 2020-06-01 | Disposition: A | Discharge status: Home / Self Care | Payer: Medicare HMO | Attending: Emergency Medicine | Admitting: Emergency Medicine

## 2020-06-01 DIAGNOSIS — Z8616 Personal history of COVID-19: Secondary | ICD-10-CM | POA: Diagnosis not present

## 2020-06-01 DIAGNOSIS — R55 Syncope and collapse: Secondary | ICD-10-CM

## 2020-06-01 DIAGNOSIS — F0390 Unspecified dementia without behavioral disturbance: Secondary | ICD-10-CM | POA: Diagnosis not present

## 2020-06-01 DIAGNOSIS — R42 Dizziness and giddiness: Secondary | ICD-10-CM | POA: Diagnosis not present

## 2020-06-01 DIAGNOSIS — Z9660 Presence of unspecified orthopedic joint implant: Secondary | ICD-10-CM | POA: Insufficient documentation

## 2020-06-01 DIAGNOSIS — M79604 Pain in right leg: Secondary | ICD-10-CM | POA: Diagnosis not present

## 2020-06-01 DIAGNOSIS — R079 Chest pain, unspecified: Secondary | ICD-10-CM | POA: Diagnosis not present

## 2020-06-01 DIAGNOSIS — I10 Essential (primary) hypertension: Secondary | ICD-10-CM | POA: Diagnosis not present

## 2020-06-01 DIAGNOSIS — J9811 Atelectasis: Secondary | ICD-10-CM | POA: Diagnosis not present

## 2020-06-01 DIAGNOSIS — J90 Pleural effusion, not elsewhere classified: Secondary | ICD-10-CM | POA: Diagnosis not present

## 2020-06-01 DIAGNOSIS — R000 Tachycardia, unspecified: Secondary | ICD-10-CM | POA: Diagnosis not present

## 2020-06-01 DIAGNOSIS — R0902 Hypoxemia: Secondary | ICD-10-CM | POA: Diagnosis not present

## 2020-06-01 DIAGNOSIS — R69 Illness, unspecified: Secondary | ICD-10-CM | POA: Diagnosis not present

## 2020-06-01 DIAGNOSIS — Z7901 Long term (current) use of anticoagulants: Secondary | ICD-10-CM | POA: Diagnosis not present

## 2020-06-01 DIAGNOSIS — Z7401 Bed confinement status: Secondary | ICD-10-CM | POA: Diagnosis not present

## 2020-06-01 DIAGNOSIS — M2550 Pain in unspecified joint: Secondary | ICD-10-CM | POA: Diagnosis not present

## 2020-06-01 DIAGNOSIS — Z87891 Personal history of nicotine dependence: Secondary | ICD-10-CM | POA: Diagnosis not present

## 2020-06-01 LAB — BASIC METABOLIC PANEL
Anion gap: 12 (ref 5–15)
BUN: 16 mg/dL (ref 8–23)
CO2: 26 mmol/L (ref 22–32)
Calcium: 10 mg/dL (ref 8.9–10.3)
Chloride: 104 mmol/L (ref 98–111)
Creatinine, Ser: 0.93 mg/dL (ref 0.61–1.24)
GFR, Estimated: >60 mL/min (ref >60)
Glucose, Bld: 136 mg/dL — ABNORMAL HIGH (ref 70–99)
Potassium: 4 mmol/L (ref 3.5–5.1)
Sodium: 142 mmol/L (ref 135–145)

## 2020-06-01 LAB — CBC WITH DIFFERENTIAL/PLATELET
Abs Immature Granulocytes: 0.01 10*3/uL (ref 0.00–0.07)
Basophils Absolute: 0 10*3/uL (ref 0.0–0.1)
Basophils Relative: 0 %
Eosinophils Absolute: 0 10*3/uL (ref 0.0–0.5)
Eosinophils Relative: 0 %
HCT: 31 % — ABNORMAL LOW (ref 39.0–52.0)
Hemoglobin: 10 g/dL — ABNORMAL LOW (ref 13.0–17.0)
Immature Granulocytes: 0 %
Lymphocytes Relative: 23 %
Lymphs Abs: 1.3 10*3/uL (ref 0.7–4.0)
MCH: 20.7 pg — ABNORMAL LOW (ref 26.0–34.0)
MCHC: 32.3 g/dL (ref 30.0–36.0)
MCV: 64 fL — ABNORMAL LOW (ref 80.0–100.0)
Monocytes Absolute: 0.7 10*3/uL (ref 0.1–1.0)
Monocytes Relative: 11 %
Neutro Abs: 3.8 10*3/uL (ref 1.7–7.7)
Neutrophils Relative %: 66 %
Platelets: 384 10*3/uL (ref 150–400)
RBC: 4.84 MIL/uL (ref 4.22–5.81)
RDW: 17.2 % — ABNORMAL HIGH (ref 11.5–15.5)
WBC: 5.8 10*3/uL (ref 4.0–10.5)
nRBC: 0 % (ref 0.0–0.2)

## 2020-06-01 MED ORDER — ACETAMINOPHEN 325 MG PO TABS
650.0000 mg | ORAL_TABLET | Freq: Once | ORAL | Status: AC
  Administered 2020-06-01: 650 mg via ORAL
  Filled 2020-06-01: qty 2

## 2020-06-01 MED ORDER — IOHEXOL 350 MG/ML SOLN
80.0000 mL | Freq: Once | INTRAVENOUS | Status: AC | PRN
  Administered 2020-06-01: 80 mL via INTRAVENOUS

## 2020-06-01 MED ORDER — SODIUM CHLORIDE 0.9 % IV BOLUS
1000.0000 mL | Freq: Once | INTRAVENOUS | Status: AC
  Administered 2020-06-01: 1000 mL via INTRAVENOUS

## 2020-06-01 MED ORDER — SODIUM CHLORIDE 0.9 % IV SOLN: INTRAVENOUS | Status: DC

## 2020-06-01 MED ORDER — SODIUM CHLORIDE (PF) 0.9 % IJ SOLN
INTRAMUSCULAR | Status: AC
  Filled 2020-06-01: qty 50

## 2020-06-01 NOTE — Discharge Instructions (Addendum)
Follow up with your md next week if any problems °

## 2020-06-01 NOTE — ED Notes (Signed)
[facility] notified of need for transportation.  

## 2020-06-01 NOTE — ED Notes (Signed)
Unable to do vitals standing because of patient hip made MD aware

## 2020-06-01 NOTE — ED Provider Notes (Signed)
[facility] DEPT Provider Note   CSN: [id number] Arrival date & time: 06/01/20  1030     History Chief Complaint  Patient presents with  . Near Syncope    Richard [name] is a 79 y.o. male.  79 year old male with history of dementia, PE, recent hospitalization for sepsis presents after having near syncopal episode prior to arrival here.  According to EMS, patient became dizzy and lightheaded and then was helped to the ground.  No trauma was noted.  Patient did not pass out.  Patient complains of some right leg pain.  He is on eliquis.  History is limited due to his history of dementia.        Past Medical History:  Diagnosis Date  . Acute pulmonary embolism ([place]) 01/30/2019  . Acute respiratory failure with hypoxia ([place]) 01/30/2019  . AKI (acute kidney injury) ([place]) 01/30/2019  . Atrial fibrillation with RVR ([place])   . Dementia ([place])   . High cholesterol   . Hypertension   . Lung mass 12/2018  . Severe sepsis ([place]) 01/30/2019    Patient Active Problem List   Diagnosis Date Noted  . Severe sepsis ([place]) 05/09/2020  . Altered level of consciousness 05/09/2020  . Hypothermia 05/09/2020  . UTI (urinary tract infection) 05/09/2020  . Acute respiratory disease due to COVID-19 virus 02/17/2020  . Acute respiratory failure due to COVID-19 ([place]) 02/17/2020  . Syncope 11/04/2019  . Fall at home, initial encounter 11/04/2019  . Anemia 09/25/2019  . Symptomatic anemia 09/24/2019  . Paroxysmal atrial fibrillation ([place]) 07/06/2019  . Pressure injury of skin 05/21/2019  . Anorexia 05/20/2019  . HCAP (healthcare-associated pneumonia) 05/18/2019  . Protein-calorie malnutrition, severe ([place]) 05/18/2019  . Sepsis ([place]) 05/08/2019  . History of pulmonary embolism 03/30/2019  . Pure hypercholesterolemia 03/30/2019  . SOB (shortness of breath) 03/30/2019  . Palliative care by specialist   . DNR (do not resuscitate) discussion   . Poor appetite 01/05/2019  .  Goals of care, counseling/discussion 12/28/2018  . Encounter for antineoplastic chemotherapy 12/28/2018  . 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 Dementia without behavioral disturbance ([place]) 12/16/2018  . Lung mass 12/15/2018  . Pelvic mass in male 12/15/2018  . Postobstructive pneumonia 12/15/2018  . Hypertension     Past Surgical History:  Procedure Laterality Date  . IR GASTROSTOMY TUBE MOD SED  05/25/2019  . IR GASTROSTOMY TUBE REMOVAL  09/08/2019  . JOINT REPLACEMENT    . VIDEO BRONCHOSCOPY WITH ENDOBRONCHIAL ULTRASOUND N/A 12/16/2018   Procedure: VIDEO BRONCHOSCOPY WITH ENDOBRONCHIAL ULTRASOUND AND FLUROSCOPY;  Surgeon: [name], MD;  Location: [facility];  Service: Pulmonary;  Laterality: N/A;       Family History  Problem Relation Age of Onset  . Stomach cancer Mother   . COPD Father   . Lung cancer Brother   . Lung cancer Brother     Social History   Tobacco Use  . Smoking status: Former Smoker    Packs/day: 1.00    Years: 60.00    Pack years: 60.00    Types: Cigarettes  . Smokeless tobacco: Never Used  Vaping Use  . Vaping Use: Never used  Substance Use Topics  . Alcohol use: Yes    Comment: occasional  . Drug use: No    Home Medications Prior to Admission medications   Medication Sig Start Date End Date Taking? Authorizing Provider  apixaban (ELIQUIS) 5 MG TABS tablet Take 1 tablet (5 mg total)  by mouth 2 (two) times daily. 05/21/20   Curt Bears, MD  feeding supplement (ENSURE ENLIVE / ENSURE PLUS) LIQD Take 237 mLs by mouth 3 (three) times daily between meals. 05/13/20   Geradine Girt, DO  Multiple Vitamin (MULTIVITAMIN WITH MINERALS) TABS tablet Take 1 tablet by mouth daily. Patient not taking: Reported on 03/01/2020 02/21/20   Mercy Riding, MD  tamsulosin (FLOMAX) 0.4 MG CAPS capsule Take 1 capsule (0.4 mg total) by mouth daily after supper. 05/18/20 08/16/20  Terrilee Croak, MD    Allergies    Patient has no known  allergies.  Review of Systems   Review of Systems  Unable to perform ROS: Dementia    Physical Exam Updated Vital Signs Resp 18   SpO2 100%   Physical Exam Vitals and nursing note reviewed.  Constitutional:      General: He is not in acute distress.    Appearance: Normal appearance. He is well-developed and well-nourished. He is not toxic-appearing.  HENT:     Head: Normocephalic and atraumatic.  Eyes:     General: Lids are normal.     Extraocular Movements: EOM normal.     Conjunctiva/sclera: Conjunctivae normal.     Pupils: Pupils are equal, round, and reactive to light.  Neck:     Thyroid: No thyroid mass.     Trachea: No tracheal deviation.  Cardiovascular:     Rate and Rhythm: Normal rate and regular rhythm.     Heart sounds: Normal heart sounds. No murmur heard. No gallop.   Pulmonary:     Effort: Pulmonary effort is normal. No respiratory distress.     Breath sounds: Normal breath sounds. No stridor. No decreased breath sounds, wheezing, rhonchi or rales.  Abdominal:     General: Bowel sounds are normal. There is no distension.     Palpations: Abdomen is soft.     Tenderness: There is no abdominal tenderness. There is no CVA tenderness or rebound.  Musculoskeletal:        General: No tenderness or edema. Normal range of motion.     Cervical back: Normal range of motion and neck supple.  Skin:    General: Skin is warm and dry.     Findings: No abrasion or rash.  Neurological:     Mental Status: He is alert. He is disoriented.     GCS: GCS eye subscore is 4. GCS verbal subscore is 5. GCS motor subscore is 6.     Cranial Nerves: No cranial nerve deficit.     Sensory: No sensory deficit.     Deep Tendon Reflexes: Strength normal.  Psychiatric:        Attention and Perception: Attention normal.        Mood and Affect: Affect is labile.        Speech: Speech is rapid and pressured.        Behavior: Behavior is aggressive.     ED Results / Procedures /  Treatments   Labs (all labs ordered are listed, but only abnormal results are displayed) Labs Reviewed  CBC WITH DIFFERENTIAL/PLATELET  BASIC METABOLIC PANEL  URINALYSIS, ROUTINE W REFLEX MICROSCOPIC    EKG None  Radiology No results found.  Procedures Procedures (including critical care time)  Medications Ordered in ED Medications  sodium chloride 0.9 % bolus 1,000 mL (has no administration in time range)  0.9 %  sodium chloride infusion (has no administration in time range)    ED Course  I have  reviewed the triage vital signs and the nursing notes.  Pertinent labs & imaging results that were available during my care of the patient were reviewed by me and considered in my medical decision making (see chart for details).    MDM Rules/Calculators/A&P                          Chest CT negative for PE.  Electrolytes are normal limits.  He is not orthostatic.  Resting comfortably at this time.  Will check CBC and if negative will discharge Final Clinical Impression(s) / ED Diagnoses Final diagnoses:  None    Rx / DC Orders ED Discharge Orders    None       Lacretia Leigh, MD 06/01/20 [phone]

## 2020-06-01 NOTE — ED Triage Notes (Signed)
Pt BIBA from home.   Per EMS- Pt had witnessed near syncopal episode, pt was in chair getting hair cut, eyes rolled back, was assisted to floor. C/o right leg pain, c/o dizziness.   Hx of dementia.   CBG 171 BP 107/62 HR 99 100% RA Alert to baseline.

## 2020-06-01 NOTE — Telephone Encounter (Signed)
Pt daughter LM stating the pt has been taken via ambulance to [facility] for non responsiveness and will be be able to make his lab and CT appt today.

## 2020-06-01 NOTE — ED Notes (Signed)
Pt provided with water, ham sandwich.

## 2020-06-01 NOTE — ED Notes (Signed)
Verified with pts daughter, Richard [name], that someone will be home at pts home address to receive him when he arrives via [place].

## 2020-06-01 NOTE — ED Notes (Signed)
IV start attempted x2, unsuccessful.  2nd RN will be asked to start pts IV, obtain blood work.

## 2020-06-04 ENCOUNTER — Inpatient Hospital Stay: Payer: Medicare HMO | Admitting: Internal Medicine

## 2020-06-28 ENCOUNTER — Telehealth: Payer: Self-pay | Admitting: Medical Oncology

## 2020-06-28 NOTE — Telephone Encounter (Signed)
06/28/2020-I tried to call [name] and tell her to call Eloquis copay assistance line for pt  [place] -call [phone]. I was unable to speak to anyone . Someone picked up then phone disconnected.   06/27/2020-Roz left Copay assistance information on my desk for Eloquis.

## 2020-07-31 ENCOUNTER — Emergency Department (HOSPITAL_COMMUNITY): Payer: Medicare Other

## 2020-07-31 ENCOUNTER — Inpatient Hospital Stay (HOSPITAL_COMMUNITY)
Admission: EM | Admit: 2020-07-31 | Discharge: 2020-08-09 | DRG: 871 | Disposition: A | Discharge status: Skilled Nursing Facility | Payer: Medicare Other | Attending: Internal Medicine | Admitting: Internal Medicine

## 2020-07-31 ENCOUNTER — Other Ambulatory Visit: Payer: Self-pay

## 2020-07-31 DIAGNOSIS — E785 Hyperlipidemia, unspecified: Secondary | ICD-10-CM | POA: Diagnosis present

## 2020-07-31 DIAGNOSIS — N400 Enlarged prostate without lower urinary tract symptoms: Secondary | ICD-10-CM | POA: Diagnosis present

## 2020-07-31 DIAGNOSIS — E7800 Pure hypercholesterolemia, unspecified: Secondary | ICD-10-CM | POA: Diagnosis present

## 2020-07-31 DIAGNOSIS — B964 Proteus (mirabilis) (morganii) as the cause of diseases classified elsewhere: Secondary | ICD-10-CM | POA: Diagnosis present

## 2020-07-31 DIAGNOSIS — I10 Essential (primary) hypertension: Secondary | ICD-10-CM | POA: Diagnosis present

## 2020-07-31 DIAGNOSIS — Z86711 Personal history of pulmonary embolism: Secondary | ICD-10-CM

## 2020-07-31 DIAGNOSIS — Z20822 Contact with and (suspected) exposure to covid-19: Secondary | ICD-10-CM | POA: Diagnosis present

## 2020-07-31 DIAGNOSIS — R079 Chest pain, unspecified: Secondary | ICD-10-CM | POA: Diagnosis not present

## 2020-07-31 DIAGNOSIS — A419 Sepsis, unspecified organism: Secondary | ICD-10-CM

## 2020-07-31 DIAGNOSIS — R6520 Severe sepsis without septic shock: Secondary | ICD-10-CM | POA: Diagnosis present

## 2020-07-31 DIAGNOSIS — Z800 Family history of malignant neoplasm of digestive organs: Secondary | ICD-10-CM

## 2020-07-31 DIAGNOSIS — R4182 Altered mental status, unspecified: Secondary | ICD-10-CM

## 2020-07-31 DIAGNOSIS — L89221 Pressure ulcer of left hip, stage 1: Secondary | ICD-10-CM | POA: Diagnosis present

## 2020-07-31 DIAGNOSIS — A4159 Other Gram-negative sepsis: Secondary | ICD-10-CM | POA: Diagnosis not present

## 2020-07-31 DIAGNOSIS — Z87440 Personal history of urinary (tract) infections: Secondary | ICD-10-CM

## 2020-07-31 DIAGNOSIS — C3490 Malignant neoplasm of unspecified part of unspecified bronchus or lung: Secondary | ICD-10-CM | POA: Diagnosis present

## 2020-07-31 DIAGNOSIS — N179 Acute kidney failure, unspecified: Secondary | ICD-10-CM | POA: Diagnosis present

## 2020-07-31 DIAGNOSIS — Z85118 Personal history of other malignant neoplasm of bronchus and lung: Secondary | ICD-10-CM

## 2020-07-31 DIAGNOSIS — Z825 Family history of asthma and other chronic lower respiratory diseases: Secondary | ICD-10-CM

## 2020-07-31 DIAGNOSIS — Z79899 Other long term (current) drug therapy: Secondary | ICD-10-CM

## 2020-07-31 DIAGNOSIS — Z8616 Personal history of COVID-19: Secondary | ICD-10-CM

## 2020-07-31 DIAGNOSIS — G9341 Metabolic encephalopathy: Secondary | ICD-10-CM | POA: Diagnosis present

## 2020-07-31 DIAGNOSIS — Z801 Family history of malignant neoplasm of trachea, bronchus and lung: Secondary | ICD-10-CM

## 2020-07-31 DIAGNOSIS — T68XXXA Hypothermia, initial encounter: Secondary | ICD-10-CM | POA: Diagnosis present

## 2020-07-31 DIAGNOSIS — L89222 Pressure ulcer of left hip, stage 2: Secondary | ICD-10-CM | POA: Diagnosis present

## 2020-07-31 DIAGNOSIS — R627 Adult failure to thrive: Secondary | ICD-10-CM | POA: Diagnosis present

## 2020-07-31 DIAGNOSIS — F0390 Unspecified dementia without behavioral disturbance: Secondary | ICD-10-CM | POA: Diagnosis present

## 2020-07-31 DIAGNOSIS — N390 Urinary tract infection, site not specified: Secondary | ICD-10-CM | POA: Diagnosis present

## 2020-07-31 DIAGNOSIS — I480 Paroxysmal atrial fibrillation: Secondary | ICD-10-CM | POA: Diagnosis present

## 2020-07-31 DIAGNOSIS — Z87891 Personal history of nicotine dependence: Secondary | ICD-10-CM

## 2020-07-31 DIAGNOSIS — Z7901 Long term (current) use of anticoagulants: Secondary | ICD-10-CM

## 2020-07-31 LAB — CBC
HCT: 33.8 % — ABNORMAL LOW (ref 39.0–52.0)
Hemoglobin: 10.8 g/dL — ABNORMAL LOW (ref 13.0–17.0)
MCH: 19.5 pg — ABNORMAL LOW (ref 26.0–34.0)
MCHC: 32 g/dL (ref 30.0–36.0)
MCV: 61.1 fL — ABNORMAL LOW (ref 80.0–100.0)
Platelets: 230 10*3/uL (ref 150–400)
RBC: 5.53 MIL/uL (ref 4.22–5.81)
RDW: 21.2 % — ABNORMAL HIGH (ref 11.5–15.5)
WBC: 4.4 10*3/uL (ref 4.0–10.5)
nRBC: 0 % (ref 0.0–0.2)

## 2020-07-31 LAB — BASIC METABOLIC PANEL
Anion gap: 11 (ref 5–15)
BUN: 19 mg/dL (ref 8–23)
CO2: 24 mmol/L (ref 22–32)
Calcium: 9.7 mg/dL (ref 8.9–10.3)
Chloride: 106 mmol/L (ref 98–111)
Creatinine, Ser: 0.64 mg/dL (ref 0.61–1.24)
GFR, Estimated: >60 mL/min (ref >60)
Glucose, Bld: 158 mg/dL — ABNORMAL HIGH (ref 70–99)
Potassium: 4.6 mmol/L (ref 3.5–5.1)
Sodium: 141 mmol/L (ref 135–145)

## 2020-07-31 LAB — TROPONIN I (HIGH SENSITIVITY): Troponin I (High Sensitivity): 20 ng/L — ABNORMAL HIGH (ref <18)

## 2020-07-31 NOTE — ED Triage Notes (Signed)
Pt here via [facility] for eval of now-resolved central CP, non-radiating. EMS also reports that family wanted pt evaluated & is concerned about generalized weakness & potential failure to thrive. Pt has hx UTI but denies symptoms at present.   151mL NS 22g R hand.

## 2020-07-31 NOTE — ED Provider Notes (Signed)
[facility] EMERGENCY DEPARTMENT Provider Note   CSN: [id number] Arrival date & time: 07/31/20  2017     History Chief Complaint  Patient presents with  . Chest Pain  . Failure To Thrive    Richard [name] is a 80 y.o. male.  Richard [name] is a 80 y.o. male with a history of PE, A. fib, AKI, hypertension, hyperlipidemia, dementia, who presents to the emergency department via EMS for evaluation of chest pain.  Patient reports that yesterday he started having some pain in the center of his chest he reports it resolved on its own and he had a more mild episode of chest pain this morning as well.  Patient denies chest pain currently denies any shortness of breath.  Denies any cough.  Patient is confused and then begins talking about his twin and being on the street.  Patient tells me that he lives with his mother and is unable to provide much additional history.  I called and spoke with patient's daughter, Richard [name] who the patient lives with, who reports that over the past 6 days patient has become increasingly confused.  She reports he has been up most of the night often, drinking lots of water with frequent urination.  He has a history of UTIs and she was worried that that may be going on again, but she has not noted any blood in his urine like he typically has.  She does not think he has had any fevers, but reports that even with his dementia he is more confused than usual and not at his baseline.  At baseline walks around without difficulty.  No new medication changes.  No other aggravating or alleviating factors.  Level 5 caveat: Altered mental status        Past Medical History:  Diagnosis Date  . Acute pulmonary embolism ([place]) 01/30/2019  . Acute respiratory failure with hypoxia ([place]) 01/30/2019  . AKI (acute kidney injury) ([place]) 01/30/2019  . Atrial fibrillation with RVR ([place])   . Dementia ([place])   . High cholesterol   . Hypertension   . Lung mass 12/2018  . Severe  sepsis ([place]) 01/30/2019    Patient Active Problem List   Diagnosis Date Noted  . Severe sepsis ([place]) 05/09/2020  . Altered level of consciousness 05/09/2020  . Hypothermia 05/09/2020  . UTI (urinary tract infection) 05/09/2020  . Acute respiratory disease due to COVID-19 virus 02/17/2020  . Acute respiratory failure due to COVID-19 ([place]) 02/17/2020  . Syncope 11/04/2019  . Fall at home, initial encounter 11/04/2019  . Anemia 09/25/2019  . Symptomatic anemia 09/24/2019  . Paroxysmal atrial fibrillation ([place]) 07/06/2019  . Pressure injury of skin 05/21/2019  . Anorexia 05/20/2019  . HCAP (healthcare-associated pneumonia) 05/18/2019  . Protein-calorie malnutrition, severe ([place]) 05/18/2019  . Sepsis ([place]) 05/08/2019  . History of pulmonary embolism 03/30/2019  . Pure hypercholesterolemia 03/30/2019  . SOB (shortness of breath) 03/30/2019  . Palliative care by specialist   . DNR (do not resuscitate) discussion   . Poor appetite 01/05/2019  . Goals of care, counseling/discussion 12/28/2018  . Encounter for antineoplastic chemotherapy 12/28/2018  . 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 Dementia without behavioral disturbance ([place]) 12/16/2018  . Lung mass 12/15/2018  . Pelvic mass in male 12/15/2018  . Postobstructive pneumonia 12/15/2018  . Hypertension     Past Surgical History:  Procedure Laterality Date  . IR GASTROSTOMY TUBE MOD SED  05/25/2019  . IR GASTROSTOMY  TUBE REMOVAL  09/08/2019  . JOINT REPLACEMENT    . VIDEO BRONCHOSCOPY WITH ENDOBRONCHIAL ULTRASOUND N/A 12/16/2018   Procedure: VIDEO BRONCHOSCOPY WITH ENDOBRONCHIAL ULTRASOUND AND FLUROSCOPY;  Surgeon: [name], MD;  Location: [facility];  Service: Pulmonary;  Laterality: N/A;       Family History  Problem Relation Age of Onset  . Stomach cancer Mother   . COPD Father   . Lung cancer Brother   . Lung cancer Brother     Social History   Tobacco Use  . Smoking status: Former  Smoker    Packs/day: 1.00    Years: 60.00    Pack years: 60.00    Types: Cigarettes  . Smokeless tobacco: Never Used  Vaping Use  . Vaping Use: Never used  Substance Use Topics  . Alcohol use: Yes    Comment: occasional  . Drug use: No    Home Medications Prior to Admission medications   Medication Sig Start Date End Date Taking? Authorizing Provider  apixaban (ELIQUIS) 5 MG TABS tablet Take 1 tablet (5 mg total) by mouth 2 (two) times daily. 05/21/20   Curt Bears, MD  feeding supplement (ENSURE ENLIVE / ENSURE PLUS) LIQD Take 237 mLs by mouth 3 (three) times daily between meals. 05/13/20   Geradine Girt, DO  Multiple Vitamin (MULTIVITAMIN WITH MINERALS) TABS tablet Take 1 tablet by mouth daily. Patient not taking: Reported on 03/01/2020 02/21/20   Mercy Riding, MD  tamsulosin (FLOMAX) 0.4 MG CAPS capsule Take 1 capsule (0.4 mg total) by mouth daily after supper. 05/18/20 08/16/20  Terrilee Croak, MD    Allergies    Patient has no known allergies.  Review of Systems   Review of Systems  Unable to perform ROS: Mental status change    Physical Exam Updated Vital Signs BP 108/62 (BP Location: Left Arm)   Pulse 85   Resp 16   SpO2 100%  Temp 92 (rectal)  Physical Exam Vitals and nursing note reviewed.  Constitutional:      General: He is not in acute distress.    Appearance: He is well-developed and well-nourished. He is not diaphoretic.     Comments: Alert, pleasantly confused, no acute distress  HENT:     Head: Normocephalic and atraumatic.     Mouth/Throat:     Mouth: Oropharynx is clear and moist.  Eyes:     General:        Right eye: No discharge.        Left eye: No discharge.     Extraocular Movements: EOM normal.     Pupils: Pupils are equal, round, and reactive to light.  Cardiovascular:     Rate and Rhythm: Normal rate and regular rhythm.     Pulses: Intact distal pulses.          Radial pulses are 2+ on the right side and 2+ on the left side.        Dorsalis pedis pulses are 2+ on the right side and 2+ on the left side.     Heart sounds: No murmur heard.   Pulmonary:     Effort: Pulmonary effort is normal. No respiratory distress.     Breath sounds: Decreased breath sounds present. No wheezing or rales.     Comments: Respirations are equal and unlabored, patient able to speak in full sentences, satting well on room air, on auscultation patient has some diminished breath sounds bilaterally but no focal wheezes, rales or rhonchi. Chest:  Chest wall: No tenderness.  Abdominal:     General: Bowel sounds are normal. There is no distension.     Palpations: Abdomen is soft. There is no mass.     Tenderness: There is no abdominal tenderness. There is no guarding.     Comments: Abdomen soft, nondistended, nontender to palpation in all quadrants without guarding or peritoneal signs   Musculoskeletal:        General: No deformity or edema.     Cervical back: Neck supple.     Right lower leg: No edema.     Left lower leg: No edema.  Skin:    General: Skin is warm and dry.     Capillary Refill: Capillary refill takes less than 2 seconds.  Neurological:     Mental Status: He is alert.     Coordination: Coordination normal.     Comments: Speech is clear, able to follow commands, oriented to person, and place, but not time CN III-XII intact Normal strength in upper and lower extremities bilaterally including dorsiflexion and plantar flexion, strong and equal grip strength Sensation normal to light and sharp touch Moves extremities without ataxia, coordination intact   Psychiatric:        Mood and Affect: Mood normal.        Behavior: Behavior normal.        Cognition and Memory: Cognition is impaired. Memory is impaired.     ED Results / Procedures / Treatments   Labs (all labs ordered are listed, but only abnormal results are displayed) Labs Reviewed  BASIC METABOLIC PANEL - Abnormal; Notable for the following components:       Result Value   Glucose, Bld 158 (*)    All other components within normal limits  CBC - Abnormal; Notable for the following components:   Hemoglobin 10.8 (*)    HCT 33.8 (*)    MCV 61.1 (*)    MCH 19.5 (*)    RDW 21.2 (*)    All other components within normal limits  HEPATIC FUNCTION PANEL - Abnormal; Notable for the following components:   Albumin 3.3 (*)    AST 80 (*)    ALT 64 (*)    Bilirubin, Direct 0.5 (*)    All other components within normal limits  URINALYSIS, ROUTINE W REFLEX MICROSCOPIC - Abnormal; Notable for the following components:   APPearance CLOUDY (*)    Protein, ur 100 (*)    Nitrite POSITIVE (*)    Leukocytes,Ua LARGE (*)    WBC, UA >50 (*)    Bacteria, UA MANY (*)    All other components within normal limits  LACTIC ACID, PLASMA - Abnormal; Notable for the following components:   Lactic Acid, Venous 2.6 (*)    All other components within normal limits  APTT - Abnormal; Notable for the following components:   aPTT 39 (*)    All other components within normal limits  CBC - Abnormal; Notable for the following components:   Hemoglobin 10.0 (*)    HCT 31.5 (*)    MCV 61.0 (*)    MCH 19.4 (*)    RDW 21.4 (*)    All other components within normal limits  COMPREHENSIVE METABOLIC PANEL - Abnormal; Notable for the following components:   Potassium 5.2 (*)    Total Protein 5.7 (*)    Albumin 2.9 (*)    AST 68 (*)    ALT 57 (*)    All other components within normal limits  AMMONIA - Abnormal; Notable for the following components:   Ammonia 57 (*)    All other components within normal limits  TROPONIN I (HIGH SENSITIVITY) - Abnormal; Notable for the following components:   Troponin I (High Sensitivity) 20 (*)    All other components within normal limits  TROPONIN I (HIGH SENSITIVITY) - Abnormal; Notable for the following components:   Troponin I (High Sensitivity) 18 (*)    All other components within normal limits  RESP PANEL BY RT-PCR (FLU A&B, COVID)  ARPGX2  URINE CULTURE  CULTURE, BLOOD (ROUTINE X 2)  CULTURE, BLOOD (ROUTINE X 2)  LACTIC ACID, PLASMA  PROTIME-INR  TSH  CORTISOL  VITAMIN B12    EKG EKG Interpretation  Date/Time:  Tuesday July 31 2020 20:20:43 EST Ventricular Rate:  85 PR Interval:  150 QRS Duration: 80 QT Interval:  366 QTC Calculation: 435 R Axis:   68 Text Interpretation: Normal sinus rhythm Nonspecific ST and T wave abnormality Abnormal ECG Confirmed by Noemi Chapel [phone]) on 07/31/2020 9:15:29 PM   Radiology DG Chest 2 View  Result Date: 07/31/2020 CLINICAL DATA:  Chest pain and failure to thrive. EXAM: CHEST - 2 VIEW COMPARISON:  May 09, 2020 FINDINGS: Stable decreased lung volumes are seen on the right. Stable moderate severity right basilar scarring and/or atelectasis is seen. A trace amount of left basilar atelectasis and/or scarring is also noted. There is no evidence of a pleural effusion or pneumothorax. The heart size and mediastinal contours are within normal limits. The visualized skeletal structures are unremarkable. IMPRESSION: 1. Stable, likely post treatment related right basilar scarring and/or atelectasis. Electronically Signed   By: Virgina Norfolk M.D.   On: 07/31/2020 20:58   CT Head Wo Contrast  Result Date: 07/31/2020 CLINICAL DATA:  Encephalopathy EXAM: CT HEAD WITHOUT CONTRAST TECHNIQUE: Contiguous axial images were obtained from the base of the skull through the vertex without intravenous contrast. COMPARISON:  05/09/2020 FINDINGS: Brain: Unchanged advanced ventriculomegaly. No acute hemorrhage. No midline shift or other mass effect. Vascular: Atherosclerotic calcification of the internal carotid arteries at the skull base. No abnormal hyperdensity of the major intracranial arteries or dural venous sinuses. Skull: The visualized skull base, calvarium and extracranial soft tissues are normal. Sinuses/Orbits: No fluid levels or advanced mucosal thickening of the visualized  paranasal sinuses. No mastoid or middle ear effusion. The orbits are normal. IMPRESSION: 1. No acute intracranial abnormality. 2. Unchanged advanced ventriculomegaly. Electronically Signed   By: Ulyses Jarred M.D.   On: 07/31/2020 22:42    Procedures .Critical Care Performed by: Jacqlyn Larsen, PA-C Authorized by: Jacqlyn Larsen, PA-C   Critical care provider statement:    Critical care time (minutes):  45   Critical care was necessary to treat or prevent imminent or life-threatening deterioration of the following conditions:  Sepsis   Critical care was time spent personally by me on the following activities:  Discussions with consultants, evaluation of patient's response to treatment, examination of patient, ordering and performing treatments and interventions, ordering and review of laboratory studies, ordering and review of radiographic studies, pulse oximetry, re-evaluation of patient's condition, obtaining history from patient or surrogate and review of old charts     Medications Ordered in ED Medications  cefTRIAXone (ROCEPHIN) 1 g in sodium chloride 0.9 % 100 mL IVPB (has no administration in time range)  sodium chloride 0.9 % bolus 500 mL (0 mLs Intravenous Stopped 08/01/20 0223)  ceFEPIme (MAXIPIME) 2 g in sodium chloride 0.9 % 100 mL  IVPB (0 g Intravenous Stopped 08/01/20 0223)  metroNIDAZOLE (FLAGYL) IVPB 500 mg (0 mg Intravenous Stopped 08/01/20 0223)  vancomycin (VANCOREADY) IVPB 1000 mg/200 mL (0 mg Intravenous Stopped 08/01/20 0324)    ED Course  I have reviewed the triage vital signs and the nursing notes.  Pertinent labs & imaging results that were available during my care of the patient were reviewed by me and considered in my medical decision making (see chart for details).  MDM Rules/Calculators/A&P                          80 y.o. male presents to the ED with complaints of chest pain, but daughter reports increasing confusion over the past 6 days with concern for  potential UTI, this involves an extensive number of treatment options, and is a complaint that carries with it a high risk of complications and morbidity.  The differential diagnosis includes ACS, pneumonia, PE, CHF, COPD, arrhythmia, UTI, sepsis, intracranial pathology, metabolic encephalopathy  On arrival pt is, pleasantly confused but in no acute distress initial vitals were unremarkable, but there was delay in getting temperature.  Notified by nursing that when they were able to get a rectal temp patient was hypothermic with temp of 92, bear hugger applied.   Given significant hypothermia concern for infection and likely developing sepsis.  Code sepsis initiated with broad-spectrum antibiotics for unknown source at this time.  Patient is normotensive, does have cardiac history, will hold off on aggressive fluid resuscitation at this time pending first lactic acid  Additional history obtained from patient's daughter, Dartanyon Frankowski. Previous records obtained and reviewed via EMR  I ordered medication IV antibiotics for sepsis  Lab Tests:  I Ordered, reviewed, and interpreted labs, which included:  CBC: No leukocytosis, stable hemoglobin CMP: Mildly elevated glucose, no other significant electrolyte derangements, normal renal function, mild transaminitis Lactic acid: Initially elevated at 2.6, Improved to 1.6 with small fluid bolus, very mild elevation in lactic acid is not indicative of severe sepsis and patient remains normotensive. Troponin: Initially 20, delta troponin 18, not suggestive of ACS UA: Obvious signs of infection with nitrites, large leukocytes and many bacteria, patient has already been given broad-spectrum antibiotics  Imaging Studies ordered:  I ordered imaging studies which included head CT and chest x-ray, I independently visualized and interpreted imaging which showed stable right basilar scarring with no other acute cardiopulmonary disease.  No acute intracranial  abnormalities noted on head CT.  Unchanged advanced ventriculomegaly  ED Course:   Critical interventions: Code sepsis initiated with broad-spectrum antibiotics and IV fluids, bear hugger applied for hypothermia  Patient with hypothermia, sepsis and altered mental status, initially with unclear source, now with known UTI, patient is critically ill and will require hospital admission.  Case discussed with Dr. Marlowe Sax with [facility] hospitalist who will see and admit the patient.  Portions of this note were generated with Lobby[employer]. Dictation errors may occur despite best attempts at proofreading.    Final Clinical Impression(s) / ED Diagnoses Final diagnoses:  Sepsis secondary to UTI ([place])  Altered mental status, unspecified altered mental status type    Rx / DC Orders ED Discharge Orders    None       Janet Berlin 08/01/20 2156    Noemi Chapel, MD 08/05/20 [phone]

## 2020-07-31 NOTE — ED Provider Notes (Incomplete)
[facility] EMERGENCY DEPARTMENT Provider Note   CSN: [id number] Arrival date & time: 07/31/20  2017     History Chief Complaint  Patient presents with  . Chest Pain  . Failure To Thrive    Richard [name] is a 80 y.o. male.  HPI     Past Medical History:  Diagnosis Date  . Acute pulmonary embolism ([place]) 01/30/2019  . Acute respiratory failure with hypoxia ([place]) 01/30/2019  . AKI (acute kidney injury) ([place]) 01/30/2019  . Atrial fibrillation with RVR ([place])   . Dementia ([place])   . High cholesterol   . Hypertension   . Lung mass 12/2018  . Severe sepsis ([place]) 01/30/2019    Patient Active Problem List   Diagnosis Date Noted  . Severe sepsis ([place]) 05/09/2020  . Altered level of consciousness 05/09/2020  . Hypothermia 05/09/2020  . UTI (urinary tract infection) 05/09/2020  . Acute respiratory disease due to COVID-19 virus 02/17/2020  . Acute respiratory failure due to COVID-19 ([place]) 02/17/2020  . Syncope 11/04/2019  . Fall at home, initial encounter 11/04/2019  . Anemia 09/25/2019  . Symptomatic anemia 09/24/2019  . Paroxysmal atrial fibrillation ([place]) 07/06/2019  . Pressure injury of skin 05/21/2019  . Anorexia 05/20/2019  . HCAP (healthcare-associated pneumonia) 05/18/2019  . Protein-calorie malnutrition, severe ([place]) 05/18/2019  . Sepsis ([place]) 05/08/2019  . History of pulmonary embolism 03/30/2019  . Pure hypercholesterolemia 03/30/2019  . SOB (shortness of breath) 03/30/2019  . Palliative care by specialist   . DNR (do not resuscitate) discussion   . Poor appetite 01/05/2019  . Goals of care, counseling/discussion 12/28/2018  . Encounter for antineoplastic chemotherapy 12/28/2018  . 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 Dementia without behavioral disturbance ([place]) 12/16/2018  . Lung mass 12/15/2018  . Pelvic mass in male 12/15/2018  . Postobstructive pneumonia 12/15/2018  . Hypertension     Past Surgical  History:  Procedure Laterality Date  . IR GASTROSTOMY TUBE MOD SED  05/25/2019  . IR GASTROSTOMY TUBE REMOVAL  09/08/2019  . JOINT REPLACEMENT    . VIDEO BRONCHOSCOPY WITH ENDOBRONCHIAL ULTRASOUND N/A 12/16/2018   Procedure: VIDEO BRONCHOSCOPY WITH ENDOBRONCHIAL ULTRASOUND AND FLUROSCOPY;  Surgeon: [name], MD;  Location: [facility];  Service: Pulmonary;  Laterality: N/A;       Family History  Problem Relation Age of Onset  . Stomach cancer Mother   . COPD Father   . Lung cancer Brother   . Lung cancer Brother     Social History   Tobacco Use  . Smoking status: Former Smoker    Packs/day: 1.00    Years: 60.00    Pack years: 60.00    Types: Cigarettes  . Smokeless tobacco: Never Used  Vaping Use  . Vaping Use: Never used  Substance Use Topics  . Alcohol use: Yes    Comment: occasional  . Drug use: No    Home Medications Prior to Admission medications   Medication Sig Start Date End Date Taking? Authorizing Provider  apixaban (ELIQUIS) 5 MG TABS tablet Take 1 tablet (5 mg total) by mouth 2 (two) times daily. 05/21/20   Curt Bears, MD  feeding supplement (ENSURE ENLIVE / ENSURE PLUS) LIQD Take 237 mLs by mouth 3 (three) times daily between meals. 05/13/20   Geradine Girt, DO  Multiple Vitamin (MULTIVITAMIN WITH MINERALS) TABS tablet Take 1 tablet by mouth daily. Patient not taking: Reported on 03/01/2020 02/21/20   Mercy Riding, MD  tamsulosin (FLOMAX) 0.4 MG CAPS capsule Take 1 capsule (0.4 mg total) by mouth daily after supper. 05/18/20 08/16/20  Terrilee Croak, MD    Allergies    Patient has no known allergies.  Review of Systems   Review of Systems  Physical Exam Updated Vital Signs BP 108/62 (BP Location: Left Arm)   Pulse 85   Resp 16   SpO2 100%   Physical Exam  ED Results / Procedures / Treatments   Labs (all labs ordered are listed, but only abnormal results are displayed) Labs Reviewed  BASIC METABOLIC PANEL  CBC  TROPONIN I (HIGH SENSITIVITY)     EKG EKG Interpretation  Date/Time:  Tuesday July 31 2020 20:20:43 EST Ventricular Rate:  85 PR Interval:  150 QRS Duration: 80 QT Interval:  366 QTC Calculation: 435 R Axis:   68 Text Interpretation: Normal sinus rhythm Nonspecific ST and T wave abnormality Abnormal ECG Confirmed by Noemi Chapel [phone]) on 07/31/2020 9:15:29 PM   Radiology DG Chest 2 View  Result Date: 07/31/2020 CLINICAL DATA:  Chest pain and failure to thrive. EXAM: CHEST - 2 VIEW COMPARISON:  May 09, 2020 FINDINGS: Stable decreased lung volumes are seen on the right. Stable moderate severity right basilar scarring and/or atelectasis is seen. A trace amount of left basilar atelectasis and/or scarring is also noted. There is no evidence of a pleural effusion or pneumothorax. The heart size and mediastinal contours are within normal limits. The visualized skeletal structures are unremarkable. IMPRESSION: 1. Stable, likely post treatment related right basilar scarring and/or atelectasis. Electronically Signed   By: Virgina Norfolk M.D.   On: 07/31/2020 20:58    Procedures Procedures {Remember to document critical care time when appropriate:1}  Medications Ordered in ED Medications - No data to display  ED Course  I have reviewed the triage vital signs and the nursing notes.  Pertinent labs & imaging results that were available during my care of the patient were reviewed by me and considered in my medical decision making (see chart for details).    MDM Rules/Calculators/A&P                          *** Final Clinical Impression(s) / ED Diagnoses Final diagnoses:  None    Rx / DC Orders ED Discharge Orders    None

## 2020-08-01 DIAGNOSIS — R627 Adult failure to thrive: Secondary | ICD-10-CM | POA: Diagnosis present

## 2020-08-01 DIAGNOSIS — Z825 Family history of asthma and other chronic lower respiratory diseases: Secondary | ICD-10-CM | POA: Diagnosis not present

## 2020-08-01 DIAGNOSIS — Z86711 Personal history of pulmonary embolism: Secondary | ICD-10-CM | POA: Diagnosis not present

## 2020-08-01 DIAGNOSIS — Z7901 Long term (current) use of anticoagulants: Secondary | ICD-10-CM | POA: Diagnosis not present

## 2020-08-01 DIAGNOSIS — L89222 Pressure ulcer of left hip, stage 2: Secondary | ICD-10-CM | POA: Diagnosis present

## 2020-08-01 DIAGNOSIS — C3490 Malignant neoplasm of unspecified part of unspecified bronchus or lung: Secondary | ICD-10-CM | POA: Diagnosis present

## 2020-08-01 DIAGNOSIS — B964 Proteus (mirabilis) (morganii) as the cause of diseases classified elsewhere: Secondary | ICD-10-CM | POA: Diagnosis present

## 2020-08-01 DIAGNOSIS — R079 Chest pain, unspecified: Secondary | ICD-10-CM | POA: Diagnosis present

## 2020-08-01 DIAGNOSIS — I480 Paroxysmal atrial fibrillation: Secondary | ICD-10-CM | POA: Diagnosis present

## 2020-08-01 DIAGNOSIS — N179 Acute kidney failure, unspecified: Secondary | ICD-10-CM | POA: Diagnosis present

## 2020-08-01 DIAGNOSIS — T68XXXS Hypothermia, sequela: Secondary | ICD-10-CM | POA: Diagnosis not present

## 2020-08-01 DIAGNOSIS — Z87440 Personal history of urinary (tract) infections: Secondary | ICD-10-CM | POA: Diagnosis not present

## 2020-08-01 DIAGNOSIS — A4159 Other Gram-negative sepsis: Secondary | ICD-10-CM | POA: Diagnosis present

## 2020-08-01 DIAGNOSIS — F0390 Unspecified dementia without behavioral disturbance: Secondary | ICD-10-CM | POA: Diagnosis present

## 2020-08-01 DIAGNOSIS — G9341 Metabolic encephalopathy: Secondary | ICD-10-CM | POA: Diagnosis present

## 2020-08-01 DIAGNOSIS — Z85118 Personal history of other malignant neoplasm of bronchus and lung: Secondary | ICD-10-CM | POA: Diagnosis not present

## 2020-08-01 DIAGNOSIS — N390 Urinary tract infection, site not specified: Secondary | ICD-10-CM | POA: Diagnosis present

## 2020-08-01 DIAGNOSIS — Z8616 Personal history of COVID-19: Secondary | ICD-10-CM | POA: Diagnosis not present

## 2020-08-01 DIAGNOSIS — Z800 Family history of malignant neoplasm of digestive organs: Secondary | ICD-10-CM | POA: Diagnosis not present

## 2020-08-01 DIAGNOSIS — Z801 Family history of malignant neoplasm of trachea, bronchus and lung: Secondary | ICD-10-CM | POA: Diagnosis not present

## 2020-08-01 DIAGNOSIS — N400 Enlarged prostate without lower urinary tract symptoms: Secondary | ICD-10-CM | POA: Diagnosis present

## 2020-08-01 DIAGNOSIS — E785 Hyperlipidemia, unspecified: Secondary | ICD-10-CM | POA: Diagnosis present

## 2020-08-01 DIAGNOSIS — L89221 Pressure ulcer of left hip, stage 1: Secondary | ICD-10-CM | POA: Diagnosis present

## 2020-08-01 DIAGNOSIS — I10 Essential (primary) hypertension: Secondary | ICD-10-CM | POA: Diagnosis present

## 2020-08-01 DIAGNOSIS — Z20822 Contact with and (suspected) exposure to covid-19: Secondary | ICD-10-CM | POA: Diagnosis present

## 2020-08-01 DIAGNOSIS — A419 Sepsis, unspecified organism: Secondary | ICD-10-CM | POA: Diagnosis not present

## 2020-08-01 DIAGNOSIS — R6520 Severe sepsis without septic shock: Secondary | ICD-10-CM | POA: Diagnosis present

## 2020-08-01 LAB — CBC
HCT: 31.5 % — ABNORMAL LOW (ref 39.0–52.0)
Hemoglobin: 10 g/dL — ABNORMAL LOW (ref 13.0–17.0)
MCH: 19.4 pg — ABNORMAL LOW (ref 26.0–34.0)
MCHC: 31.7 g/dL (ref 30.0–36.0)
MCV: 61 fL — ABNORMAL LOW (ref 80.0–100.0)
Platelets: 205 10*3/uL (ref 150–400)
RBC: 5.16 MIL/uL (ref 4.22–5.81)
RDW: 21.4 % — ABNORMAL HIGH (ref 11.5–15.5)
WBC: 5.2 10*3/uL (ref 4.0–10.5)
nRBC: 0 % (ref 0.0–0.2)

## 2020-08-01 LAB — COMPREHENSIVE METABOLIC PANEL
ALT: 57 U/L — ABNORMAL HIGH (ref 0–44)
AST: 68 U/L — ABNORMAL HIGH (ref 15–41)
Albumin: 2.9 g/dL — ABNORMAL LOW (ref 3.5–5.0)
Alkaline Phosphatase: 100 U/L (ref 38–126)
Anion gap: 9 (ref 5–15)
BUN: 20 mg/dL (ref 8–23)
CO2: 22 mmol/L (ref 22–32)
Calcium: 9.5 mg/dL (ref 8.9–10.3)
Chloride: 111 mmol/L (ref 98–111)
Creatinine, Ser: 0.67 mg/dL (ref 0.61–1.24)
GFR, Estimated: >60 mL/min (ref >60)
Glucose, Bld: 73 mg/dL (ref 70–99)
Potassium: 5.2 mmol/L — ABNORMAL HIGH (ref 3.5–5.1)
Sodium: 142 mmol/L (ref 135–145)
Total Bilirubin: 0.7 mg/dL (ref 0.3–1.2)
Total Protein: 5.7 g/dL — ABNORMAL LOW (ref 6.5–8.1)

## 2020-08-01 LAB — TROPONIN I (HIGH SENSITIVITY): Troponin I (High Sensitivity): 18 ng/L — ABNORMAL HIGH (ref <18)

## 2020-08-01 LAB — LACTIC ACID, PLASMA
Lactic Acid, Venous: 2.6 mmol/L (ref 0.5–1.9)
Lactic Acid, Venous: 1.6 mmol/L (ref 0.5–1.9)

## 2020-08-01 LAB — RESP PANEL BY RT-PCR (FLU A&B, COVID) ARPGX2
Influenza A by PCR: NEGATIVE
Influenza B by PCR: NEGATIVE
SARS Coronavirus 2 by RT PCR: NEGATIVE

## 2020-08-01 LAB — URINALYSIS, ROUTINE W REFLEX MICROSCOPIC
Bilirubin Urine: NEGATIVE
Glucose, UA: NEGATIVE mg/dL
Hgb urine dipstick: NEGATIVE
Ketones, ur: NEGATIVE mg/dL
Nitrite: POSITIVE — AB
Protein, ur: 100 mg/dL — AB
Specific Gravity, Urine: 1.015 (ref 1.005–1.030)
WBC, UA: >50 WBC/hpf — ABNORMAL HIGH (ref 0–5)
pH: 8 (ref 5.0–8.0)

## 2020-08-01 LAB — AMMONIA: Ammonia: 57 umol/L — ABNORMAL HIGH (ref 9–35)

## 2020-08-01 LAB — HEPATIC FUNCTION PANEL
ALT: 64 U/L — ABNORMAL HIGH (ref 0–44)
AST: 80 U/L — ABNORMAL HIGH (ref 15–41)
Albumin: 3.3 g/dL — ABNORMAL LOW (ref 3.5–5.0)
Alkaline Phosphatase: 113 U/L (ref 38–126)
Bilirubin, Direct: 0.5 mg/dL — ABNORMAL HIGH (ref 0.0–0.2)
Indirect Bilirubin: 0.3 mg/dL (ref 0.3–0.9)
Total Bilirubin: 0.8 mg/dL (ref 0.3–1.2)
Total Protein: 6.8 g/dL (ref 6.5–8.1)

## 2020-08-01 LAB — VITAMIN B12: Vitamin B-12: 781 pg/mL (ref 180–914)

## 2020-08-01 LAB — APTT: aPTT: 39 seconds — ABNORMAL HIGH (ref 24–36)

## 2020-08-01 LAB — PROTIME-INR
INR: 1.2 (ref 0.8–1.2)
Prothrombin Time: 14.4 seconds (ref 11.4–15.2)

## 2020-08-01 LAB — CORTISOL: Cortisol, Plasma: 17.1 ug/dL

## 2020-08-01 LAB — TSH: TSH: 2.793 u[IU]/mL (ref 0.350–4.500)

## 2020-08-01 MED ORDER — VANCOMYCIN HCL 1000 MG/200ML IV SOLN
1000.0000 mg | Freq: Once | INTRAVENOUS | Status: AC
  Administered 2020-08-01: 1000 mg via INTRAVENOUS
  Filled 2020-08-01: qty 200

## 2020-08-01 MED ORDER — SODIUM CHLORIDE 0.9 % IV SOLN: 2.0000 g | Freq: Three times a day (TID) | INTRAVENOUS | Status: DC

## 2020-08-01 MED ORDER — SODIUM CHLORIDE 0.9 % IV BOLUS
500.0000 mL | Freq: Once | INTRAVENOUS | Status: AC
  Administered 2020-08-01: 500 mL via INTRAVENOUS

## 2020-08-01 MED ORDER — SODIUM CHLORIDE 0.9 % IV SOLN
INTRAVENOUS | Status: DC
  Administered 2020-08-06: 1000 mL via INTRAVENOUS

## 2020-08-01 MED ORDER — VANCOMYCIN HCL 1000 MG/200ML IV SOLN
1000.0000 mg | Freq: Two times a day (BID) | INTRAVENOUS | Status: DC
  Filled 2020-08-01: qty 200

## 2020-08-01 MED ORDER — SODIUM CHLORIDE 0.9 % IV SOLN
2.0000 g | Freq: Once | INTRAVENOUS | Status: AC
  Administered 2020-08-01: 2 g via INTRAVENOUS
  Filled 2020-08-01: qty 2

## 2020-08-01 MED ORDER — SODIUM CHLORIDE 0.9 % IV SOLN
1.0000 g | Freq: Every day | INTRAVENOUS | Status: DC
  Administered 2020-08-01 – 2020-08-04 (×4): 1 g via INTRAVENOUS
  Filled 2020-08-01 (×2): qty 10
  Filled 2020-08-01 (×2): qty 1

## 2020-08-01 MED ORDER — METRONIDAZOLE IN NACL 5-0.79 MG/ML-% IV SOLN
500.0000 mg | Freq: Once | INTRAVENOUS | Status: AC
  Administered 2020-08-01: 500 mg via INTRAVENOUS
  Filled 2020-08-01: qty 100

## 2020-08-01 NOTE — ED Notes (Signed)
Pt was cleaned up, linen and gown was changed, and male external catheter placed onto pt.  Pt is resting with call bell within reach.

## 2020-08-01 NOTE — Progress Notes (Signed)
Subjective: Patient admitted this morning, see detailed H&P by Dr Marlowe Sax 80 year old male with history of dementia, stage III squamous cell carcinoma of the lung, paroxysmal atrial fibrillation, embolism on Eliquis, hypertension, hyperlipidemia, BPH, Covid infection in September 2021 was admitted with severe sepsis due to Proteus UTI in December 2021.  Came to ED with complaints of chest pain, generalized weakness and failure to thrive. Patient admitted with sepsis due to UTI.  Found to have normal urine.  Started on ceftriaxone.  Urine and blood cultures obtained. Vitals:   08/01/20 1100 08/01/20 1115  BP: 121/61 102/69  Pulse:  (!) 110  Resp:    Temp:    SpO2:  (!) 56%      A/P Sepsis due to UTI-continue IV ceftriaxone, will start normal saline at 125 mL/h.  Follow urine culture results.  Hypothermia-likely from underlying sepsis, continue Bair hugger.  TSH 2.793, cortisol 92.9  Acute metabolic encephalopathy-in setting of underlying dementia, likely due to UTI.  Mild transaminitis-likely from sepsis.  Stage III non-small cell lung cancer, squamous cell carcinoma-followed by oncology as outpatient.  Paroxysmal atrial fibrillation-currently in normal sinus rhythm, continue Eliquis.  History of PE-continue Eliquis      [place] Hospitalist [phone]

## 2020-08-01 NOTE — ED Notes (Signed)
SDU Breakfast order placed 

## 2020-08-01 NOTE — Sepsis Progress Note (Signed)
Monitoring for code sepsis protocol. 

## 2020-08-01 NOTE — H&P (Signed)
History and Physical    Richard [name] RIP:[id number] DOB: 07/23/1940 DOA: 07/31/2020  PCP: Grayce Sessions, NP Patient coming from: Home  Chief Complaint: Chest pain, generalized weakness, failure to thrive  HPI: Richard [name] is a 80 y.o. 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 Arbutus Ped, paroxysmal A. fib and PE on Eliquis, hypertension, hyperlipidemia, BPH, Covid infection in September 2021, admission for severe sepsis due to Proteus UTI in December 2021 presenting to the ED via EMS for evaluation of chest pain, generalized weakness, and failure to thrive.  Patient is currently oriented to self only.  Denying chest pain.  No additional history could be obtained from him.  ED Course: Patient noted to be confused.  Hypothermic with temperature 92.8 F, remainder of vital signs stable.  WBC 4.4, hemoglobin 10.8 (at baseline), platelet count 230K.  Sodium 141, potassium 4.6, chloride 106, bicarb 24, BUN 19, creatinine 0.6, glucose 158.  High-sensitivity troponin borderline elevated but stable (20> 18).  Transaminases mildly elevated (AST 80, ALT 64).  Alk phos and T bili normal.  Lactic acid 2.6 >1.6.INR 1.2.  Blood culture x2 pending.  UA with positive nitrite, large amount of leukocytes, greater than 50 WBCs, and many bacteria.  Urine culture pending.  SARS-CoV-2 PCR test negative.  Influenza panel negative.  Chest x-ray not suggestive of pneumonia.  Head CT negative for acute intracranial abnormality. Patient was given vancomycin, cefepime, metronidazole, and 500 cc normal saline bolus.  Review of Systems:  All systems reviewed and apart from history of presenting illness, are negative.  Past Medical History:  Diagnosis Date  . Acute pulmonary embolism (HCC) 01/30/2019  . Acute respiratory failure with hypoxia (HCC) 01/30/2019  . AKI (acute kidney injury) (HCC) 01/30/2019  . Atrial fibrillation with RVR (HCC)   . Dementia  (HCC)   . High cholesterol   . Hypertension   . Lung mass 12/2018  . Severe sepsis (HCC) 01/30/2019    Past Surgical History:  Procedure Laterality Date  . IR GASTROSTOMY TUBE MOD SED  05/25/2019  . IR GASTROSTOMY TUBE REMOVAL  09/08/2019  . JOINT REPLACEMENT    . VIDEO BRONCHOSCOPY WITH ENDOBRONCHIAL ULTRASOUND N/A 12/16/2018   Procedure: VIDEO BRONCHOSCOPY WITH ENDOBRONCHIAL ULTRASOUND AND FLUROSCOPY;  Surgeon: Chilton Greathouse, MD;  Location: [facility] OR;  Service: Pulmonary;  Laterality: N/A;     reports that he has quit smoking. His smoking use included cigarettes. He has a 60.00 pack-year smoking history. He has never used smokeless tobacco. He reports current alcohol use. He reports that he does not use drugs.  No Known Allergies  Family History  Problem Relation Age of Onset  . Stomach cancer Mother   . COPD Father   . Lung cancer Brother   . Lung cancer Brother     Prior to Admission medications   Medication Sig Start Date End Date Taking? Authorizing Provider  apixaban (ELIQUIS) 5 MG TABS tablet Take 1 tablet (5 mg total) by mouth 2 (two) times daily. 05/21/20   Si Gaul, MD  feeding supplement (ENSURE ENLIVE / ENSURE PLUS) LIQD Take 237 mLs by mouth 3 (three) times daily between meals. 05/13/20   Joseph Art, DO  Multiple Vitamin (MULTIVITAMIN WITH MINERALS) TABS tablet Take 1 tablet by mouth daily. Patient not taking: Reported on 03/01/2020 02/21/20   Almon Hercules, MD  tamsulosin (FLOMAX) 0.4 MG CAPS capsule Take 1 capsule (0.4 mg total) by mouth daily after supper.  05/18/20 08/16/20  Terrilee Croak, MD    Physical Exam: Vitals:   08/01/20 0106 08/01/20 0109 08/01/20 0200 08/01/20 0239  BP:  127/80 110/71 114/68  Pulse:  80  90  Resp:  _0 Temp: (!) 92.8 F (33.8 C)     TempSrc: Rectal     SpO2:  95%  96%    Physical Exam Constitutional:      General: He is not in acute distress. HENT:     Head: Normocephalic and atraumatic.     Mouth/Throat:      Mouth: Mucous membranes are dry.  Eyes:     Extraocular Movements: Extraocular movements intact.  Cardiovascular:     Rate and Rhythm: Normal rate and regular rhythm.     Pulses: Normal pulses.  Pulmonary:     Effort: Pulmonary effort is normal. No respiratory distress.     Breath sounds: Normal breath sounds. No wheezing or rales.  Abdominal:     General: Bowel sounds are normal. There is no distension.     Palpations: Abdomen is soft.     Tenderness: There is no abdominal tenderness.  Musculoskeletal:        General: No swelling or tenderness.     Cervical back: Normal range of motion and neck supple.  Skin:    General: Skin is warm and dry.  Neurological:     Mental Status: He is alert.     Comments: Oriented to self only Moving all extremities spontaneously, no focal motor deficit     Labs on Admission: I have personally reviewed following labs and imaging studies  CBC: Recent Labs  Lab 07/31/20 2040  WBC 4.4  HGB 10.8*  HCT 33.8*  MCV 61.1*  PLT 453   Basic Metabolic Panel: Recent Labs  Lab 07/31/20 2040  NA 141  K 4.6  CL 106  CO2 24  GLUCOSE 158*  BUN 19  CREATININE 0.64  CALCIUM 9.7   GFR: CrCl cannot be calculated (Unknown ideal weight.). Liver Function Tests: Recent Labs  Lab 07/31/20 2255  AST 80*  ALT 64*  ALKPHOS 113  BILITOT 0.8  PROT 6.8  ALBUMIN 3.3*   No results for input(s): LIPASE, AMYLASE in the last 168 hours. No results for input(s): AMMONIA in the last 168 hours. Coagulation Profile: Recent Labs  Lab 08/01/20 0114  INR 1.2   Cardiac Enzymes: No results for input(s): CKTOTAL, CKMB, CKMBINDEX, TROPONINI in the last 168 hours. BNP (last 3 results) No results for input(s): PROBNP in the last 8760 hours. HbA1C: No results for input(s): HGBA1C in the last 72 hours. CBG: No results for input(s): GLUCAP in the last 168 hours. Lipid Profile: No results for input(s): CHOL, HDL, LDLCALC, TRIG, CHOLHDL, LDLDIRECT in the last  72 hours. Thyroid Function Tests: No results for input(s): TSH, T4TOTAL, FREET4, T3FREE, THYROIDAB in the last 72 hours. Anemia Panel: No results for input(s): VITAMINB12, FOLATE, FERRITIN, TIBC, IRON, RETICCTPCT in the last 72 hours. Urine analysis:    Component Value Date/Time   COLORURINE YELLOW 08/01/2020 0226   APPEARANCEUR CLOUDY (A) 08/01/2020 0226   LABSPEC 1.015 08/01/2020 0226   PHURINE 8.0 08/01/2020 0226   GLUCOSEU NEGATIVE 08/01/2020 0226   HGBUR NEGATIVE 08/01/2020 0226   BILIRUBINUR NEGATIVE 08/01/2020 0226   KETONESUR NEGATIVE 08/01/2020 0226   PROTEINUR 100 (A) 08/01/2020 0226   NITRITE POSITIVE (A) 08/01/2020 0226   LEUKOCYTESUR LARGE (A) 08/01/2020 0226    Radiological Exams on Admission: DG Chest 2  View  Result Date: 07/31/2020 CLINICAL DATA:  Chest pain and failure to thrive. EXAM: CHEST - 2 VIEW COMPARISON:  May 09, 2020 FINDINGS: Stable decreased lung volumes are seen on the right. Stable moderate severity right basilar scarring and/or atelectasis is seen. A trace amount of left basilar atelectasis and/or scarring is also noted. There is no evidence of a pleural effusion or pneumothorax. The heart size and mediastinal contours are within normal limits. The visualized skeletal structures are unremarkable. IMPRESSION: 1. Stable, likely post treatment related right basilar scarring and/or atelectasis. Electronically Signed   By: Virgina Norfolk M.D.   On: 07/31/2020 20:58   CT Head Wo Contrast  Result Date: 07/31/2020 CLINICAL DATA:  Encephalopathy EXAM: CT HEAD WITHOUT CONTRAST TECHNIQUE: Contiguous axial images were obtained from the base of the skull through the vertex without intravenous contrast. COMPARISON:  05/09/2020 FINDINGS: Brain: Unchanged advanced ventriculomegaly. No acute hemorrhage. No midline shift or other mass effect. Vascular: Atherosclerotic calcification of the internal carotid arteries at the skull base. No abnormal hyperdensity of the  major intracranial arteries or dural venous sinuses. Skull: The visualized skull base, calvarium and extracranial soft tissues are normal. Sinuses/Orbits: No fluid levels or advanced mucosal thickening of the visualized paranasal sinuses. No mastoid or middle ear effusion. The orbits are normal. IMPRESSION: 1. No acute intracranial abnormality. 2. Unchanged advanced ventriculomegaly. Electronically Signed   By: Ulyses Jarred M.D.   On: 07/31/2020 22:42    EKG: Independently reviewed.  Sinus rhythm, no significant change since prior tracing.  Assessment/Plan Principal Problem:   UTI (urinary tract infection) Active Problems:   Sepsis ([place])   Hypothermia   Acute metabolic encephalopathy   Chest pain   Sepsis secondary to UTI: Hypothermic with temperature 92.8 F.  Not hypotensive.  No leukocytosis on labs.  Mild lactic acidosis resolved after 500 cc normal saline bolus. UA with positive nitrite, large amount of leukocytes, greater than 50 WBCs, and many bacteria.  -Patient was given broad-spectrum antibiotics in the ED before urinalysis resulted.  Urine culture done during prior hospitalization in December 2021 grew Proteus sensitive to ceftriaxone.  Will continue treatment with ceftriaxone.  Urine culture pending.  Blood culture x2 pending.  Hypothermia: Suspect due to infection/sepsis.  No prolonged environmental exposure to cold temperature reported. -Scientific laboratory technician and monitor temperature frequently.  Also check TSH and cortisol levels.  Acute metabolic encephalopathy in the setting of underlying dementia: Likely due to UTI.  Head CT negative for acute intracranial abnormality.  Patient is currently oriented to self only, moving all extremities spontaneously and no focal motor deficit noted. -Continue management of UTI and monitor mental status closely.  Check TSH, B12, and ammonia levels.  Chest pain: ACS less likely as high-sensitivity troponin only borderline elevated but stable (20  > 18).  EKG without acute ischemic changes.  Patient is denying chest pain at present. -Continue to monitor  Mild transaminitis: AST 80, ALT 64.  Alk phos and T bili normal.  Sepsis could be contributing. -Repeat LFTs and if still elevated pursue further work-up with right upper quadrant ultrasound.  Stage IIIa non-small cell lung cancer, squamous cell carcinoma: Followed by Dr. Earlie Server.  Was previously treated with chemoradiation but currently on observation.  Last oncology office visit was in June 2021. -Outpatient oncology follow-up  Paroxysmal A. fib: Currently in sinus rhythm. -Resume Eliquis after pharmacy med rec is done.  History of PE -Resume Eliquis after pharmacy med rec is done.  BPH -Resume Flomax after pharmacy  med rec is done.  Pharmacy med rec pending.  DVT prophylaxis: On full dose anticoagulation at home, resume Eliquis after pharmacy med rec is done. Code Status: Full code.  Unable to discuss CODE STATUS with the patient due to his altered mental status.  Please readdress during daytime after his mental status improves. Family Communication: No family available at this time. Disposition Plan: Status is: Inpatient  Remains inpatient appropriate because:Altered mental status, IV treatments appropriate due to intensity of illness or inability to take PO and Inpatient level of care appropriate due to severity of illness   Dispo: The patient is from: Home              Anticipated d/c is to: SNF              Anticipated d/c date is: 3 days              Patient currently is not medically stable to d/c.   Difficult to place patient No  Level of care: Level of care: Progressive    The medical decision making on this patient was of high complexity and the patient is at high risk for clinical deterioration, therefore this is a level 3 visit.  Shela Leff MD [facility]  If 7PM-7AM, please contact night-coverage [email or web address]  08/01/2020, 5:18 AM

## 2020-08-01 NOTE — ED Notes (Signed)
Blood cultures drawn at 0055 (before antibiotic administration) and clicked off at [id number]

## 2020-08-01 NOTE — Progress Notes (Signed)
Pharmacy Antibiotic Note  Cornelius Roessner is a 80 y.o. male admitted on 07/31/2020 with chest pain, weakness, ?failure to thrive.  Pharmacy has been consulted for Vancomycin/Cefepime dosing. WBC WNL. Scr ok. Lactic acid up some on arrival. Hypothermic.   Plan: Vancomycin 1000 mg IV q12h >>Estimated AUC: 489 Cefepime 2g IV q8h Trend WBC, temp, renal function  F/U infectious work-up Drug levels as indicated  Temp (24hrs), Avg:92.8 F (33.8 C), Min:92.8 F (33.8 C), Max:92.8 F (33.8 C)  Recent Labs  Lab 07/31/20 2040 07/31/20 2255 08/01/20 0105  WBC 4.4  --   --   CREATININE 0.64  --   --   LATICACIDVEN  --  2.6* 1.6    CrCl cannot be calculated (Unknown ideal weight.).    No Known Allergies  Narda Bonds, PharmD, BCPS Clinical Pharmacist Phone: [phone]

## 2020-08-02 DIAGNOSIS — T68XXXS Hypothermia, sequela: Secondary | ICD-10-CM

## 2020-08-02 DIAGNOSIS — A419 Sepsis, unspecified organism: Secondary | ICD-10-CM

## 2020-08-02 LAB — CBC
HCT: 32.2 % — ABNORMAL LOW (ref 39.0–52.0)
Hemoglobin: 10.4 g/dL — ABNORMAL LOW (ref 13.0–17.0)
MCH: 19.5 pg — ABNORMAL LOW (ref 26.0–34.0)
MCHC: 32.3 g/dL (ref 30.0–36.0)
MCV: 60.3 fL — ABNORMAL LOW (ref 80.0–100.0)
Platelets: 182 10*3/uL (ref 150–400)
RBC: 5.34 MIL/uL (ref 4.22–5.81)
RDW: 21.3 % — ABNORMAL HIGH (ref 11.5–15.5)
WBC: 4.8 10*3/uL (ref 4.0–10.5)
nRBC: 0.4 % — ABNORMAL HIGH (ref 0.0–0.2)

## 2020-08-02 LAB — COMPREHENSIVE METABOLIC PANEL
ALT: 51 U/L — ABNORMAL HIGH (ref 0–44)
AST: 55 U/L — ABNORMAL HIGH (ref 15–41)
Albumin: 2.9 g/dL — ABNORMAL LOW (ref 3.5–5.0)
Alkaline Phosphatase: 98 U/L (ref 38–126)
Anion gap: 10 (ref 5–15)
BUN: 20 mg/dL (ref 8–23)
CO2: 23 mmol/L (ref 22–32)
Calcium: 9.3 mg/dL (ref 8.9–10.3)
Chloride: 110 mmol/L (ref 98–111)
Creatinine, Ser: 1.15 mg/dL (ref 0.61–1.24)
GFR, Estimated: >60 mL/min (ref >60)
Glucose, Bld: 82 mg/dL (ref 70–99)
Potassium: 4.2 mmol/L (ref 3.5–5.1)
Sodium: 143 mmol/L (ref 135–145)
Total Bilirubin: 0.5 mg/dL (ref 0.3–1.2)
Total Protein: 6.1 g/dL — ABNORMAL LOW (ref 6.5–8.1)

## 2020-08-02 MED ORDER — APIXABAN 5 MG PO TABS
5.0000 mg | ORAL_TABLET | Freq: Two times a day (BID) | ORAL | Status: DC
  Administered 2020-08-02 – 2020-08-09 (×15): 5 mg via ORAL
  Filled 2020-08-02 (×15): qty 1

## 2020-08-02 NOTE — Progress Notes (Signed)
   08/01/20 2304  Vitals  Temp 97.6 F (36.4 C)  Temp Source Oral  BP 124/73  MAP (mmHg) 86  BP Location Left Arm  BP Method Automatic  Patient Position (if appropriate) Lying  Pulse Rate (!) 108  ECG Heart Rate (!) 107  Resp (!) 29  Level of Consciousness  Level of Consciousness Alert  MEWS COLOR  MEWS Score Color Yellow  Oxygen Therapy  SpO2 94 %  O2 Device Room Air  Pain Assessment  Pain Scale PAINAD  PCA/Epidural/Spinal Assessment  Respiratory Pattern Regular;Unlabored  ECG Monitoring  Cardiac Rhythm NSR  Telemetry Box Number [id number]  Glasgow Coma Scale  Eye Opening 4  Best Verbal Response (NON-intubated) 4  Best Motor Response 6  Glasgow Coma Scale Score 14  MEWS Score  MEWS Temp 0  MEWS Systolic 0  MEWS Pulse 1  MEWS RR 2  MEWS LOC 0  MEWS Score 3   The patient is admitted to 4 NP with the diagnosis of UTI. Alert to self. Admission profile not completed due to his disorientation. No acute distress noted. Will continue to monitor.

## 2020-08-02 NOTE — Progress Notes (Signed)
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  Came to ED with complaints of chest pain, generalized weakness and failure to thrive Patient admitted with sepsis due to UTI.  Found to have abnormal urine.  Started on ceftriaxone.  Urine and blood cultures obtained.   Subjective   Patient seen and examined, denies any complaints.    Assessment/Plan:     1. Severe sepsis due to UTI-patient presented with lactic acid 2.6, hypotension.  Sepsis physiology has resolved.  Patient started on IV ceftriaxone, urine culture growing greater than 100,000 colonies per mL Proteus mirabilis.  Sensitivity is currently pending. 2. Metabolic encephalopathy-likely due to sepsis from UTI as well as underlying dementia.  Continue with IV antibiotics as above. 3. Hypothermia-patient presented with hypothermia, likely from underlying severe sepsis as above.  Bear hugger was provided.  Hypothermia has at this time resolved.  TSH 2.793, cortisol 17.1. 4. Mild transaminitis-patient presented with mildly elevated AST and ALT, likely from sepsis as above.  Slowly improving.  Follow LFTs in a.m. 5. Stage III non-small cell lung cancer/small cell carcinoma-followed by oncology as outpatient. 6. Paroxysmal atrial fibrillation-currently in normal sinus rhythm, continue Eliquis. 7. History of pulmonary embolism-continue Eliquis     COVID-19 Labs  No results for input(s): DDIMER, FERRITIN, LDH, CRP in the last 72 hours.  Lab Results  Component Value Date   SARSCOV2NAA NEGATIVE 08/01/2020   SARSCOV2NAA NEGATIVE 05/14/2020   SARSCOV2NAA NEGATIVE  05/09/2020   SARSCOV2NAA POSITIVE (A) 02/16/2020     Scheduled medications:   . apixaban  5 mg Oral BID         CBG: No results for input(s): GLUCAP in the last 168 hours.  SpO2: 92 %    CBC: Recent Labs  Lab 07/31/20 2040 08/01/20 0600 08/02/20 0011  WBC 4.4 5.2 4.8  HGB 10.8* 10.0* 10.4*  HCT 33.8* 31.5* 32.2*  MCV 61.1* 61.0* 60.3*  PLT 230 205 956    Basic Metabolic Panel: Recent Labs  Lab 07/31/20 2040 08/01/20 0600 08/02/20 0011  NA 141 142 143  K 4.6 5.2* 4.2  CL 106 111 110  CO2 24 22 23   GLUCOSE 158* 73 82  BUN 19 20 20   CREATININE 0.64 0.67 1.15  CALCIUM 9.7 9.5 9.3     Liver Function Tests: Recent Labs  Lab 07/31/20 2255 08/01/20 0600 08/02/20 0011  AST 80* 68* 55*  ALT 64* 57* 51*  ALKPHOS 113 100 98  BILITOT 0.8 0.7 0.5  PROT 6.8 5.7* 6.1*  ALBUMIN 3.3* 2.9* 2.9*     Antibiotics: Anti-infectives (From admission, onward)   Start     Dose/Rate Route Frequency Ordered Stop   08/01/20 1000  vancomycin (VANCOREADY) IVPB 1000 mg/200 mL  Status:  Discontinued        1,000 mg 200 mL/hr over 60 Minutes Intravenous Every 12 hours 08/01/20 0238 08/01/20 0516   08/01/20 0600  ceFEPIme (MAXIPIME) 2 g in sodium chloride 0.9 % 100 mL IVPB  Status:  Discontinued        2 g 200 mL/hr over 30 Minutes Intravenous Every 8 hours 08/01/20 0238 08/01/20 0516   08/01/20 0520  cefTRIAXone (ROCEPHIN) 1  g in sodium chloride 0.9 % 100 mL IVPB        1 g 200 mL/hr over 30 Minutes Intravenous Daily 08/01/20 0516     08/01/20 0100  ceFEPIme (MAXIPIME) 2 g in sodium chloride 0.9 % 100 mL IVPB        2 g 200 mL/hr over 30 Minutes Intravenous  Once 08/01/20 0050 08/01/20 0223   08/01/20 0100  metroNIDAZOLE (FLAGYL) IVPB 500 mg        500 mg 100 mL/hr over 60 Minutes Intravenous  Once 08/01/20 0050 08/01/20 0223   08/01/20 0100  vancomycin (VANCOREADY) IVPB 1000 mg/200 mL        1,000 mg 200 mL/hr over 60 Minutes Intravenous  Once 08/01/20 0050 08/01/20  0324       DVT prophylaxis: Apixaban  Code Status: Full code  Family Communication: No family at bedside   Consultants:    Procedures:      Objective   Vitals:   08/02/20 0349 08/02/20 0400 08/02/20 0801 08/02/20 1130  BP: (!) 141/65  127/69 108/66  Pulse: (!) 103  97 (!) 106  Resp: 18  18 19   Temp: 98.1 F (36.7 C)  (!) 97.3 F (36.3 C) 97.9 F (36.6 C)  TempSrc: Oral  Oral Oral  SpO2: 95% 91% 99% 92%    Intake/Output Summary (Last 24 hours) at 08/02/2020 1533 Last data filed at 08/02/2020 1145 Gross per 24 hour  Intake 1480 ml  Output 800 ml  Net 680 ml    02/15 1901 - 02/17 0700 In: 1300 [I.V.:1100] Out: 800 [Urine:800]  There were no vitals filed for this visit.  Physical Examination:    General-appears in no acute distress  Heart-S1-S2, regular, no murmur auscultated  Lungs-clear to auscultation bilaterally, no wheezing or crackles auscultated  Abdomen-soft, nontender, no organomegaly  Extremities-no edema in the lower extremities  Neuro-alert, oriented to self only   Status is: Inpatient  Dispo: The patient is from: Home              Anticipated d/c is to: Home versus skilled nursing facility              Anticipated d/c date is: 08/06/2020              Patient currently not stable for discharge  Barrier to discharge-severe sepsis due to UTI  Pressure Injury 05/19/19 Hip Left;Lower Stage II -  Partial thickness loss of dermis presenting as a shallow open ulcer with a red, pink wound bed without slough. Stage II present with scab over it (Active)  05/19/19 0348  Location: Hip  Location Orientation: Left;Lower  Staging: Stage II -  Partial thickness loss of dermis presenting as a shallow open ulcer with a red, pink wound bed without slough.  Wound Description (Comments): Stage II present with scab over it  Present on Admission: Yes     Pressure Injury 05/19/19 Hip Right;Left Stage I -  Intact skin with non-blanchable redness of a  localized area usually over a bony prominence. (Active)  05/19/19 0355  Location: Hip  Location Orientation: Right;Left  Staging: Stage I -  Intact skin with non-blanchable redness of a localized area usually over a bony prominence.  Wound Description (Comments):   Present on Admission:            Data Reviewed:   Recent Results (from the past 240 hour(s))  Culture, blood (routine x 2)     Status: None (Preliminary result)  Collection Time: 08/01/20  1:05 AM   Specimen: BLOOD  Result Value Ref Range Status   Specimen Description BLOOD LEFT FOREARM  Final   Special Requests   Final    BOTTLES DRAWN AEROBIC AND ANAEROBIC Blood Culture adequate volume   Culture   Final    NO GROWTH 1 DAY Performed at [facility], [place] [address], [place], [place] [address]    Report Status PENDING  Incomplete  Urine culture     Status: Abnormal (Preliminary result)   Collection Time: 08/01/20  2:20 AM   Specimen: Urine, Random  Result Value Ref Range Status   Specimen Description URINE, RANDOM  Final   Special Requests   Final    NONE Performed at [facility], [place] [address], [place], [place] [address]    Culture >=100,000 COLONIES/mL PROTEUS MIRABILIS (A)  Final   Report Status PENDING  Incomplete  Resp Panel by RT-PCR (Flu A&B, Covid) Nasopharyngeal Swab     Status: None   Collection Time: 08/01/20  2:44 AM   Specimen: Nasopharyngeal Swab; Nasopharyngeal(NP) swabs in vial transport medium  Result Value Ref Range Status   SARS Coronavirus 2 by RT PCR NEGATIVE NEGATIVE Final    Comment: (NOTE) SARS-CoV-2 target nucleic acids are NOT DETECTED.  The SARS-CoV-2 RNA is generally detectable in upper respiratory specimens during the acute phase of infection. The lowest concentration of SARS-CoV-2 viral copies this assay can detect is 138 copies/mL. A negative result does not preclude SARS-Cov-2 infection and should not be used as the sole basis for treatment or other  patient management decisions. A negative result may occur with  improper specimen collection/handling, submission of specimen other than nasopharyngeal swab, presence of viral mutation(s) within the areas targeted by this assay, and inadequate number of viral copies(<138 copies/mL). A negative result must be combined with clinical observations, patient history, and epidemiological information. The expected result is Negative.  Fact Sheet for Patients:  EntrepreneurPulse.com.au  Fact Sheet for Healthcare Providers:  IncredibleEmployment.be  This test is no t yet approved or cleared by the [place] FDA and  has been authorized for detection and/or diagnosis of SARS-CoV-2 by FDA under an Emergency Use Authorization (EUA). This EUA will remain  in effect (meaning this test can be used) for the duration of the COVID-19 declaration under Section 564(b)(1) of the Act, 21 U.S.C.section 360bbb-3(b)(1), unless the authorization is terminated  or revoked sooner.       Influenza A by PCR NEGATIVE NEGATIVE Final   Influenza B by PCR NEGATIVE NEGATIVE Final    Comment: (NOTE) The Xpert Xpress SARS-CoV-2/FLU/RSV plus assay is intended as an aid in the diagnosis of influenza from Nasopharyngeal swab specimens and should not be used as a sole basis for treatment. Nasal washings and aspirates are unacceptable for Xpert Xpress SARS-CoV-2/FLU/RSV testing.  Fact Sheet for Patients: EntrepreneurPulse.com.au  Fact Sheet for Healthcare Providers: IncredibleEmployment.be  This test is not yet approved or cleared by the [place] FDA and has been authorized for detection and/or diagnosis of SARS-CoV-2 by FDA under an Emergency Use Authorization (EUA). This EUA will remain in effect (meaning this test can be used) for the duration of the COVID-19 declaration under Section 564(b)(1) of the Act, 21 U.S.C. section  360bbb-3(b)(1), unless the authorization is terminated or revoked.  Performed at [facility], [place] [address], [place], [place] [address]   Culture, blood (Routine X 2) w Reflex to ID Panel  Status: None (Preliminary result)   Collection Time: 08/02/20 12:11 AM   Specimen: BLOOD RIGHT HAND  Result Value Ref Range Status   Specimen Description BLOOD RIGHT HAND  Final   Special Requests   Final    BOTTLES DRAWN AEROBIC ONLY Blood Culture adequate volume Performed at [facility], [address] [place], [place] [address]    Culture PENDING  Incomplete   Report Status PENDING  Incomplete    No results for input(s): LIPASE, AMYLASE in the last 168 hours. Recent Labs  Lab 08/01/20 0600  AMMONIA 57*    Cardiac Enzymes: No results for input(s): CKTOTAL, CKMB, CKMBINDEX, TROPONINI in the last 168 hours. BNP (last 3 results) Recent Labs    01/31/20 2100  BNP 43.7    ProBNP (last 3 results) No results for input(s): PROBNP in the last 8760 hours.  Studies:  DG Chest 2 View  Result Date: 07/31/2020 CLINICAL DATA:  Chest pain and failure to thrive. EXAM: CHEST - 2 VIEW COMPARISON:  May 09, 2020 FINDINGS: Stable decreased lung volumes are seen on the right. Stable moderate severity right basilar scarring and/or atelectasis is seen. A trace amount of left basilar atelectasis and/or scarring is also noted. There is no evidence of a pleural effusion or pneumothorax. The heart size and mediastinal contours are within normal limits. The visualized skeletal structures are unremarkable. IMPRESSION: 1. Stable, likely post treatment related right basilar scarring and/or atelectasis. Electronically Signed   By: Virgina Norfolk M.D.   On: 07/31/2020 20:58   CT Head Wo Contrast  Result Date: 07/31/2020 CLINICAL DATA:  Encephalopathy EXAM: CT HEAD WITHOUT CONTRAST TECHNIQUE: Contiguous axial images were obtained from the base of the skull through the vertex without  intravenous contrast. COMPARISON:  05/09/2020 FINDINGS: Brain: Unchanged advanced ventriculomegaly. No acute hemorrhage. No midline shift or other mass effect. Vascular: Atherosclerotic calcification of the internal carotid arteries at the skull base. No abnormal hyperdensity of the major intracranial arteries or dural venous sinuses. Skull: The visualized skull base, calvarium and extracranial soft tissues are normal. Sinuses/Orbits: No fluid levels or advanced mucosal thickening of the visualized paranasal sinuses. No mastoid or middle ear effusion. The orbits are normal. IMPRESSION: 1. No acute intracranial abnormality. 2. Unchanged advanced ventriculomegaly. Electronically Signed   By: Ulyses Jarred M.D.   On: 07/31/2020 22:42       [place]   [facility] If 7PM-7AM, please contact night-coverage at [email or web address], Office  [phone]   08/02/2020, 3:33 PM  LOS: 1 day

## 2020-08-03 LAB — URINE CULTURE: Culture: >=100000 — AB

## 2020-08-03 LAB — COMPREHENSIVE METABOLIC PANEL
ALT: 42 U/L (ref 0–44)
AST: 40 U/L (ref 15–41)
Albumin: 2.8 g/dL — ABNORMAL LOW (ref 3.5–5.0)
Alkaline Phosphatase: 93 U/L (ref 38–126)
Anion gap: 8 (ref 5–15)
BUN: 19 mg/dL (ref 8–23)
CO2: 22 mmol/L (ref 22–32)
Calcium: 9 mg/dL (ref 8.9–10.3)
Chloride: 107 mmol/L (ref 98–111)
Creatinine, Ser: 0.78 mg/dL (ref 0.61–1.24)
GFR, Estimated: >60 mL/min (ref >60)
Glucose, Bld: 73 mg/dL (ref 70–99)
Potassium: 4 mmol/L (ref 3.5–5.1)
Sodium: 137 mmol/L (ref 135–145)
Total Bilirubin: 0.8 mg/dL (ref 0.3–1.2)
Total Protein: 5.8 g/dL — ABNORMAL LOW (ref 6.5–8.1)

## 2020-08-03 NOTE — Progress Notes (Signed)
Physical Therapy Evaluation Patient Details Name: Richard [name] MRN: [id number] DOB: [birth year] Today's Date: 08/03/2020   History of Present Illness  The pt is a 80 yo male presenting with generalized weakness and failure to thrive. Upon workup, pt found to have sepsis due to UTI. 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  Clinical Impression  Pt in bed upon arrival of PT, agreeable to evaluation at this time. Due to current disorientation and baseline cognitive deficits, pt unable to clarify prior level of mobility or assistance needed for ADLs. 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 The pt was able to generate good UE power to initiate movement, but does benefit from increased cues for technique, sequencing, and assist to complete. The pt will continue to benefit from skilled PT to progress functional mobility and maximize independence with transfers as possible to allow for safe d/c to venue recommended below.     Follow Up Recommendations SNF;Supervision/Assistance - 24 hour - memory care if possible    Equipment Recommendations   (WC, but pt may already have access to one)    Recommendations for Other Services       Precautions / Restrictions Precautions Precautions: Fall Restrictions Weight Bearing Restrictions: No Other Position/Activity Restrictions: bilateral knee contractures ~ 90 deg      Mobility  Bed Mobility Overal bed mobility: Needs Assistance Bed Mobility: Rolling;Sidelying to Sit Rolling: Min assist Sidelying to sit: Mod assist;HOB elevated       General bed mobility comments: pt already slightly turned to side, minA to complete roll, modA to manage BLE off EOB and to pull to sit.    Transfers Overall transfer level:  Needs assistance   Transfers: Sit to/from Stand;Lateral/Scoot Transfers Sit to Stand: Total assist        Lateral/Scoot Transfers: Mod assist General transfer comment: despite multiple attempts, pt unable to achieve any hip clearance from bed for sit-stand or stand pivot. was able to generate some clearance for lateral scooting to drop-arm recliner with modA and use of bed pad to complete  Ambulation/Gait             General Gait Details: pt unable to achieve stand      Balance Overall balance assessment: Needs assistance Sitting-balance support: No upper extremity supported;Feet supported Sitting balance-Leahy Scale: Fair         Standing balance comment: pt unable to achieve stand                             Pertinent Vitals/Pain Pain Assessment: Faces Faces Pain Scale: Hurts little more Pain Location: bilateral knees with attempt to straighten Pain Descriptors / Indicators: Discomfort Pain Intervention(s): Monitored during session;Limited activity within patient's tolerance    Home Living Family/patient expects to be discharged to:: Private residence                 Additional Comments: pt unable to answer questions regarding living situation, states he lives with his mother and is walking at baseline, however, due to significant weakness and bilateral knee contractures, suspect pt is not ambulatory at baselien    Prior Function Level of Independence: Needs assistance         Comments: pt unable to answer questions regarding  mobility prior to admission, suspect relies on assist from family for IADLs and ADLs based on chart and prior admissions     Hand Dominance   Dominant Hand: Right    Extremity/Trunk Assessment   Upper Extremity Assessment Upper Extremity Assessment: Generalized weakness    Lower Extremity Assessment Lower Extremity Assessment: Generalized weakness;RLE deficits/detail;LLE deficits/detail RLE Deficits / Details:  significant flexion contracture (~90 deg) painful with any attempt to straighten or extend knee. 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 able to perform hip flexion and ankle movements, but very weak    Cervical / Trunk Assessment Cervical / Trunk Assessment: Kyphotic  Communication   Communication: No difficulties  Cognition Arousal/Alertness: Awake/alert Behavior During Therapy: WFL for tasks assessed/performed Overall Cognitive Status: History of cognitive impairments - at baseline                                 General Comments: no family present to confirm change in cognition. Pt stating he is 80 yo, living with his mother who is "34-55 years old". Pt oriented to name only, requires cues for sequencing and assist to problem solve in regards to mobility.      General Comments General comments (skin integrity, edema, etc.): VSS on RA, HR to 110s with scooting to recliner with slight SOB that pt states happens whenever he moves.    Exercises     Assessment/Plan    PT Assessment Patient needs continued PT services  PT Problem List Decreased strength;Decreased range of motion;Decreased activity tolerance;Decreased balance;Decreased mobility;Decreased coordination;Decreased knowledge of use of DME;Decreased safety awareness;Impaired tone       PT Treatment Interventions DME instruction;Gait training;Stair training;Functional mobility training;Therapeutic activities;Balance training;Therapeutic exercise;Patient/family education    PT Goals (Current goals can be found in the Care Plan section)  Acute Rehab PT Goals Patient Stated Goal: to go home PT Goal Formulation: With patient Time For Goal Achievement: 08/17/20 Potential to Achieve Goals: Fair    Frequency Min 2X/week   Barriers to discharge   unsure of pt support at  home, no family present       AM-PAC PT "6 Clicks" Mobility  Outcome Measure Help needed turning from your back to your side while in a flat bed without using bedrails?: Total Help needed moving from lying on your back to sitting on the side of a flat bed without using bedrails?: A Lot Help needed moving to and from a bed to a chair (including a wheelchair)?: A Lot Help needed standing up from a chair using your arms (e.g., wheelchair or bedside chair)?: Total Help needed to walk in hospital room?: Total Help needed climbing 3-5 steps with a railing? : Total 6 Click Score: 8    End of Session Equipment Utilized During Treatment: Gait belt Activity Tolerance: Patient limited by fatigue Patient left: in chair;with call bell/phone within reach;with chair alarm set Nurse Communication: Mobility status PT Visit Diagnosis: Muscle weakness (generalized) (M62.81);Other abnormalities of gait and mobility (R26.89)    Time: 5784-6962 PT Time Calculation (min) (ACUTE ONLY): 26 min   Charges:   PT Evaluation $PT Eval Moderate Complexity: 1 Mod PT Treatments $Therapeutic Activity: 8-22 mins        Karma Ganja, PT, DPT   Acute Rehabilitation Department Pager #: [phone]  Delio Slates  Garvin Fila 08/03/2020, 5:39 PM

## 2020-08-03 NOTE — Progress Notes (Signed)
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  Came to ED with complaints of chest pain, generalized weakness and failure to thrive Patient admitted with sepsis due to UTI.  Found to have abnormal urine.  Started on ceftriaxone.  Urine and blood cultures obtained.   Subjective   Patient seen and examined, he is more alert, communicating well.  Denies any pain.    Assessment/Plan:     1. Severe sepsis due to UTI-patient presented with lactic acid 2.6, hypotension.  Sepsis physiology has resolved.  Patient started on IV ceftriaxone, urine culture growing greater than 100,000 colonies per mL Proteus mirabilis.  Sensitivity is currently pending. 2. Metabolic encephalopathy-likely due to sepsis from UTI as well as underlying dementia.  Significantly improved.  Likely at baseline.  Continue with IV antibiotics as above. 3. Hypothermia-patient presented with hypothermia, likely from underlying severe sepsis as above.  Bair hugger was provided.  Hypothermia has at this time resolved.  TSH 2.793, cortisol 17.1. 4. Mild transaminitis-patient presented with mildly elevated AST and ALT, likely from sepsis as above.  Slowly improving.  Follow LFTs in a.m. 5. Stage III non-small cell lung cancer/small cell carcinoma-followed by oncology as outpatient. 6. Paroxysmal atrial fibrillation-currently in normal sinus rhythm, continue Eliquis. 7. History of pulmonary embolism-continue Eliquis     COVID-19 Labs  No results for input(s): DDIMER, FERRITIN, LDH, CRP in the last 72 hours.  Lab Results  Component Value Date   SARSCOV2NAA  NEGATIVE 08/01/2020   SARSCOV2NAA NEGATIVE 05/14/2020   SARSCOV2NAA NEGATIVE 05/09/2020   SARSCOV2NAA POSITIVE (A) 02/16/2020     Scheduled medications:   . apixaban  5 mg Oral BID         CBG: No results for input(s): GLUCAP in the last 168 hours.  SpO2: 97 %    CBC: Recent Labs  Lab 07/31/20 2040 08/01/20 0600 08/02/20 0011  WBC 4.4 5.2 4.8  HGB 10.8* 10.0* 10.4*  HCT 33.8* 31.5* 32.2*  MCV 61.1* 61.0* 60.3*  PLT 230 205 025    Basic Metabolic Panel: Recent Labs  Lab 07/31/20 2040 08/01/20 0600 08/02/20 0011 08/03/20 0150  NA 141 142 143 137  K 4.6 5.2* 4.2 4.0  CL 106 111 110 107  CO2 24 22 23 22   GLUCOSE 158* 73 82 73  BUN 19 20 20 19   CREATININE 0.64 0.67 1.15 0.78  CALCIUM 9.7 9.5 9.3 9.0     Liver Function Tests: Recent Labs  Lab 07/31/20 2255 08/01/20 0600 08/02/20 0011 08/03/20 0150  AST 80* 68* 55* 40  ALT 64* 57* 51* 42  ALKPHOS 113 100 98 93  BILITOT 0.8 0.7 0.5 0.8  PROT 6.8 5.7* 6.1* 5.8*  ALBUMIN 3.3* 2.9* 2.9* 2.8*     Antibiotics: Anti-infectives (From admission, onward)   Start     Dose/Rate Route Frequency Ordered Stop   08/01/20 1000  vancomycin (VANCOREADY) IVPB 1000 mg/200 mL  Status:  Discontinued        1,000 mg 200 mL/hr over 60 Minutes Intravenous Every 12 hours 08/01/20 0238 08/01/20 0516   08/01/20 0600  ceFEPIme (MAXIPIME) 2 g in sodium chloride 0.9 % 100 mL IVPB  Status:  Discontinued        2 g 200 mL/hr over 30 Minutes Intravenous Every 8 hours 08/01/20 0238 08/01/20 0516   08/01/20 0520  cefTRIAXone (ROCEPHIN) 1 g in sodium chloride 0.9 % 100 mL IVPB        1 g 200 mL/hr over 30 Minutes Intravenous Daily 08/01/20 0516     08/01/20 0100  ceFEPIme (MAXIPIME) 2 g in sodium chloride 0.9 % 100 mL IVPB        2 g 200 mL/hr over 30 Minutes Intravenous  Once 08/01/20 0050 08/01/20 0223   08/01/20 0100  metroNIDAZOLE (FLAGYL) IVPB 500 mg        500 mg 100 mL/hr over 60 Minutes Intravenous  Once 08/01/20 0050  08/01/20 0223   08/01/20 0100  vancomycin (VANCOREADY) IVPB 1000 mg/200 mL        1,000 mg 200 mL/hr over 60 Minutes Intravenous  Once 08/01/20 0050 08/01/20 0324       DVT prophylaxis: Apixaban  Code Status: Full code  Family Communication: No family at bedside   Consultants:    Procedures:      Objective   Vitals:   08/03/20 0400 08/03/20 0600 08/03/20 0800 08/03/20 1000  BP: (!) 108/56 140/66 108/82 117/79  Pulse: (!) 110 (!) 111 99 96  Resp:  (!) 22 15 17   Temp:   97.7 F (36.5 C) 98 F (36.7 C)  TempSrc:   Axillary Oral  SpO2: 95% 97% 99% 97%    Intake/Output Summary (Last 24 hours) at 08/03/2020 1340 Last data filed at 08/03/2020 0920 Gross per 24 hour  Intake 3407.66 ml  Output 3300 ml  Net 107.66 ml    02/16 1901 - 02/18 0700 In: 4402.6 [P.O.:960; I.V.:3342.6] Out: 2850 [Urine:2850]  There were no vitals filed for this visit.  Physical Examination:    General-appears in no acute distress  Heart-S1-S2, regular, no murmur auscultated  Lungs-clear to auscultation bilaterally, no wheezing or crackles auscultated  Abdomen-soft, nontender, no organomegaly  Extremities-no edema in the lower extremities  Neuro-alert, oriented to place and person   Status is: Inpatient  Dispo: The patient is from: Home              Anticipated d/c is to: Home versus skilled nursing facility              Anticipated d/c date is: 08/06/2020              Patient currently not stable for discharge  Barrier to discharge-severe sepsis due to UTI  Pressure Injury 05/19/19 Hip Left;Lower Stage II -  Partial thickness loss of dermis presenting as a shallow open ulcer with a red, pink wound bed without slough. Stage II present with scab over it (Active)  05/19/19 0348  Location: Hip  Location Orientation: Left;Lower  Staging: Stage II -  Partial thickness loss of dermis presenting as a shallow open ulcer with a red, pink wound bed without slough.  Wound  Description (Comments): Stage II present with scab over it  Present on Admission: Yes     Pressure Injury 05/19/19 Hip Right;Left Stage I -  Intact skin with non-blanchable redness of a localized area usually over a bony prominence. (Active)  05/19/19 0355  Location: Hip  Location Orientation: Right;Left  Staging: Stage I -  Intact skin with non-blanchable redness of a localized area usually over a bony prominence.  Wound Description (Comments):   Present on Admission:  Data Reviewed:   Recent Results (from the past 240 hour(s))  Culture, blood (routine x 2)     Status: None (Preliminary result)   Collection Time: 08/01/20  1:05 AM   Specimen: BLOOD  Result Value Ref Range Status   Specimen Description BLOOD LEFT FOREARM  Final   Special Requests   Final    BOTTLES DRAWN AEROBIC AND ANAEROBIC Blood Culture adequate volume   Culture   Final    NO GROWTH 1 DAY Performed at [facility], [address], [place], [place] [address]    Report Status PENDING  Incomplete  Urine culture     Status: Abnormal   Collection Time: 08/01/20  2:20 AM   Specimen: Urine, Random  Result Value Ref Range Status   Specimen Description URINE, RANDOM  Final   Special Requests   Final    NONE Performed at [facility], [place] [address] [place], [place] [address]    Culture >=100,000 COLONIES/mL PROTEUS MIRABILIS (A)  Final   Report Status 08/03/2020 FINAL  Final   Organism ID, Bacteria PROTEUS MIRABILIS (A)  Final      Susceptibility   Proteus mirabilis - MIC*    AMPICILLIN <=2 SENSITIVE Sensitive     CEFAZOLIN <=4 SENSITIVE Sensitive     CEFEPIME <=0.12 SENSITIVE Sensitive     CEFTRIAXONE <=0.25 SENSITIVE Sensitive     CIPROFLOXACIN <=0.25 SENSITIVE Sensitive     GENTAMICIN <=1 SENSITIVE Sensitive     IMIPENEM 2 SENSITIVE Sensitive     NITROFURANTOIN 128 RESISTANT Resistant     TRIMETH/SULFA <=20 SENSITIVE Sensitive     AMPICILLIN/SULBACTAM <=2 SENSITIVE  Sensitive     PIP/TAZO <=4 SENSITIVE Sensitive     * >=100,000 COLONIES/mL PROTEUS MIRABILIS  Resp Panel by RT-PCR (Flu A&B, Covid) Nasopharyngeal Swab     Status: None   Collection Time: 08/01/20  2:44 AM   Specimen: Nasopharyngeal Swab; Nasopharyngeal(NP) swabs in vial transport medium  Result Value Ref Range Status   SARS Coronavirus 2 by RT PCR NEGATIVE NEGATIVE Final    Comment: (NOTE) SARS-CoV-2 target nucleic acids are NOT DETECTED.  The SARS-CoV-2 RNA is generally detectable in upper respiratory specimens during the acute phase of infection. The lowest concentration of SARS-CoV-2 viral copies this assay can detect is 138 copies/mL. A negative result does not preclude SARS-Cov-2 infection and should not be used as the sole basis for treatment or other patient management decisions. A negative result may occur with  improper specimen collection/handling, submission of specimen other than nasopharyngeal swab, presence of viral mutation(s) within the areas targeted by this assay, and inadequate number of viral copies(<138 copies/mL). A negative result must be combined with clinical observations, patient history, and epidemiological information. The expected result is Negative.  Fact Sheet for Patients:  EntrepreneurPulse.com.au  Fact Sheet for Healthcare Providers:  IncredibleEmployment.be  This test is no t yet approved or cleared by the [place] FDA and  has been authorized for detection and/or diagnosis of SARS-CoV-2 by FDA under an Emergency Use Authorization (EUA). This EUA will remain  in effect (meaning this test can be used) for the duration of the COVID-19 declaration under Section 564(b)(1) of the Act, 21 U.S.C.section 360bbb-3(b)(1), unless the authorization is terminated  or revoked sooner.       Influenza A by PCR NEGATIVE NEGATIVE Final   Influenza B by PCR NEGATIVE NEGATIVE Final    Comment: (NOTE) The Xpert Xpress  SARS-CoV-2/FLU/RSV plus assay is intended as  an aid in the diagnosis of influenza from Nasopharyngeal swab specimens and should not be used as a sole basis for treatment. Nasal washings and aspirates are unacceptable for Xpert Xpress SARS-CoV-2/FLU/RSV testing.  Fact Sheet for Patients: EntrepreneurPulse.com.au  Fact Sheet for Healthcare Providers: IncredibleEmployment.be  This test is not yet approved or cleared by the [place] FDA and has been authorized for detection and/or diagnosis of SARS-CoV-2 by FDA under an Emergency Use Authorization (EUA). This EUA will remain in effect (meaning this test can be used) for the duration of the COVID-19 declaration under Section 564(b)(1) of the Act, 21 U.S.C. section 360bbb-3(b)(1), unless the authorization is terminated or revoked.  Performed at [facility], [place] [address] [place], [place] [address]   Culture, blood (Routine X 2) w Reflex to ID Panel     Status: None (Preliminary result)   Collection Time: 08/02/20 12:11 AM   Specimen: BLOOD RIGHT HAND  Result Value Ref Range Status   Specimen Description BLOOD RIGHT HAND  Final   Special Requests   Final    BOTTLES DRAWN AEROBIC ONLY Blood Culture adequate volume Performed at [facility], [place] [address] [place], [place] [address]    Culture PENDING  Incomplete   Report Status PENDING  Incomplete    No results for input(s): LIPASE, AMYLASE in the last 168 hours. Recent Labs  Lab 08/01/20 0600  AMMONIA 57*    Cardiac Enzymes: No results for input(s): CKTOTAL, CKMB, CKMBINDEX, TROPONINI in the last 168 hours. BNP (last 3 results) Recent Labs    01/31/20 2100  BNP 43.7    ProBNP (last 3 results) No results for input(s): PROBNP in the last 8760 hours.  Studies:  No results found.     Oswald Hillock   [facility] If 7PM-7AM, please contact night-coverage at [email or web address], Office   [phone]   08/03/2020, 1:40 PM  LOS: 2 days

## 2020-08-04 MED ORDER — CEPHALEXIN 500 MG PO CAPS
500.0000 mg | ORAL_CAPSULE | Freq: Two times a day (BID) | ORAL | Status: AC
  Administered 2020-08-05 – 2020-08-07 (×6): 500 mg via ORAL
  Filled 2020-08-04 (×6): qty 1

## 2020-08-04 NOTE — Progress Notes (Signed)
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  Came to ED with complaints of chest pain, generalized weakness and failure to thrive Patient admitted with sepsis due to UTI.  Found to have abnormal urine.  Started on ceftriaxone.  Urine and blood cultures obtained.   Subjective   Patient seen and examined, denies any complaints.    Assessment/Plan:     1. Severe sepsis due to UTI-patient presented with lactic acid 2.6, hypotension.  Sepsis physiology has resolved.  Patient started on IV ceftriaxone, urine culture growing greater than 100,000 colonies per mL Proteus mirabilis.  Sensitive to ceftriaxone, cefazolin.  Will discontinue ceftriaxone and start p.o. Keflex. 2. Metabolic encephalopathy-likely due to sepsis from UTI as well as underlying dementia.  Significantly improved.  Likely at baseline.  Continue with  antibiotics as above. 3. Hypothermia-patient presented with hypothermia, likely from underlying severe sepsis as above.  Bair hugger was provided.  Hypothermia has at this time resolved.  TSH 2.793, cortisol 17.1. 4. Mild transaminitis-patient presented with mildly elevated AST and ALT, likely from sepsis as above.  Slowly improving.  Follow LFTs in a.m. 5. Stage III non-small cell lung cancer/small cell carcinoma-followed by oncology as outpatient. 6. Paroxysmal atrial fibrillation-currently in normal sinus rhythm, continue Eliquis. 7. History of pulmonary embolism-continue Eliquis     COVID-19 Labs  No results for input(s): DDIMER, FERRITIN, LDH, CRP in the last 72 hours.  Lab Results  Component Value  Date   SARSCOV2NAA NEGATIVE 08/01/2020   SARSCOV2NAA NEGATIVE 05/14/2020   SARSCOV2NAA NEGATIVE 05/09/2020   SARSCOV2NAA POSITIVE (A) 02/16/2020     Scheduled medications:   . apixaban  5 mg Oral BID         CBG: No results for input(s): GLUCAP in the last 168 hours.  SpO2: 97 %    CBC: Recent Labs  Lab 07/31/20 2040 08/01/20 0600 08/02/20 0011  WBC 4.4 5.2 4.8  HGB 10.8* 10.0* 10.4*  HCT 33.8* 31.5* 32.2*  MCV 61.1* 61.0* 60.3*  PLT 230 205 423    Basic Metabolic Panel: Recent Labs  Lab 07/31/20 2040 08/01/20 0600 08/02/20 0011 08/03/20 0150  NA 141 142 143 137  K 4.6 5.2* 4.2 4.0  CL 106 111 110 107  CO2 24 22 23 22   GLUCOSE 158* 73 82 73  BUN 19 20 20 19   CREATININE 0.64 0.67 1.15 0.78  CALCIUM 9.7 9.5 9.3 9.0     Liver Function Tests: Recent Labs  Lab 07/31/20 2255 08/01/20 0600 08/02/20 0011 08/03/20 0150  AST 80* 68* 55* 40  ALT 64* 57* 51* 42  ALKPHOS 113 100 98 93  BILITOT 0.8 0.7 0.5 0.8  PROT 6.8 5.7* 6.1* 5.8*  ALBUMIN 3.3* 2.9* 2.9* 2.8*     Antibiotics: Anti-infectives (From admission, onward)   Start     Dose/Rate Route Frequency Ordered Stop   08/01/20 1000  vancomycin (VANCOREADY) IVPB 1000 mg/200 mL  Status:  Discontinued        1,000 mg 200 mL/hr over 60 Minutes Intravenous Every 12 hours 08/01/20 0238 08/01/20 0516   08/01/20 0600  ceFEPIme (MAXIPIME) 2 g in sodium chloride 0.9 % 100 mL IVPB  Status:  Discontinued        2 g 200 mL/hr over 30 Minutes Intravenous Every 8 hours 08/01/20 0238 08/01/20 0516   08/01/20 0520  cefTRIAXone (ROCEPHIN) 1 g in sodium chloride 0.9 % 100 mL IVPB        1 g 200 mL/hr over 30 Minutes Intravenous Daily 08/01/20 0516     08/01/20 0100  ceFEPIme (MAXIPIME) 2 g in sodium chloride 0.9 % 100 mL IVPB        2 g 200 mL/hr over 30 Minutes Intravenous  Once 08/01/20 0050 08/01/20 0223   08/01/20 0100  metroNIDAZOLE (FLAGYL) IVPB 500 mg        500 mg 100 mL/hr over 60 Minutes Intravenous   Once 08/01/20 0050 08/01/20 0223   08/01/20 0100  vancomycin (VANCOREADY) IVPB 1000 mg/200 mL        1,000 mg 200 mL/hr over 60 Minutes Intravenous  Once 08/01/20 0050 08/01/20 0324       DVT prophylaxis: Apixaban  Code Status: Full code  Family Communication: No family at bedside   Consultants:    Procedures:      Objective   Vitals:   08/04/20 0200 08/04/20 0334 08/04/20 0728 08/04/20 1113  BP: (!) 106/54 121/62 117/69 123/70  Pulse: 89 95 (!) 107 98  Resp: 15 15 20 17   Temp:  98.6 F (37 C) 98.8 F (37.1 C) 97.9 F (36.6 C)  TempSrc:  Oral Axillary Oral  SpO2: 93% 94% 97% 97%    Intake/Output Summary (Last 24 hours) at 08/04/2020 1159 Last data filed at 08/04/2020 0734 Gross per 24 hour  Intake 2234.03 ml  Output 4200 ml  Net -1965.97 ml    02/17 1901 - 02/19 0700 In: 3981.3 [P.O.:840; I.V.:3041.3] Out: 6350 [Urine:6350]  There were no vitals filed for this visit.  Physical Examination:   General-appears in no acute distress Heart-S1-S2, regular, no murmur auscultated Lungs-clear to auscultation bilaterally, no wheezing or crackles auscultated Abdomen-soft, nontender, no organomegaly Extremities-no edema in the lower extremities Neuro-alert, oriented x3, no focal deficit noted  Status is: Inpatient  Dispo: The patient is from: Home              Anticipated d/c is [id number]              Anticipated d/c date is: 08/06/2020              Patient currently medically stable for discharge  Barrier to discharge-awaiting bed at skilled nursing facility  Pressure Injury 05/19/19 Hip Left;Lower Stage II -  Partial thickness loss of dermis presenting as a shallow open ulcer with a red, pink wound bed without slough. Stage II present with scab over it (Active)  05/19/19 0348  Location: Hip  Location Orientation: Left;Lower  Staging: Stage II -  Partial thickness loss of dermis presenting as a shallow open ulcer with a red, pink wound  bed without slough.  Wound Description (Comments): Stage II present with scab over it  Present on Admission: Yes     Pressure Injury 05/19/19 Hip Right;Left Stage I -  Intact skin with non-blanchable redness of a localized area usually over a bony prominence. (Active)  05/19/19 0355  Location: Hip  Location Orientation: Right;Left  Staging: Stage I -  Intact skin with non-blanchable redness of a localized area usually over a bony prominence.  Wound Description (Comments):   Present on Admission:            Data  Reviewed:   Recent Results (from the past 240 hour(s))  Culture, blood (routine x 2)     Status: None (Preliminary result)   Collection Time: 08/01/20  1:05 AM   Specimen: BLOOD  Result Value Ref Range Status   Specimen Description BLOOD LEFT FOREARM  Final   Special Requests   Final    BOTTLES DRAWN AEROBIC AND ANAEROBIC Blood Culture adequate volume   Culture   Final    NO GROWTH 3 DAYS Performed at [facility], [address] [place], [place] [address]    Report Status PENDING  Incomplete  Urine culture     Status: Abnormal   Collection Time: 08/01/20  2:20 AM   Specimen: Urine, Random  Result Value Ref Range Status   Specimen Description URINE, RANDOM  Final   Special Requests   Final    NONE Performed at [facility], [place] [address], [place], [place] [address]    Culture >=100,000 COLONIES/mL PROTEUS MIRABILIS (A)  Final   Report Status 08/03/2020 FINAL  Final   Organism ID, Bacteria PROTEUS MIRABILIS (A)  Final      Susceptibility   Proteus mirabilis - MIC*    AMPICILLIN <=2 SENSITIVE Sensitive     CEFAZOLIN <=4 SENSITIVE Sensitive     CEFEPIME <=0.12 SENSITIVE Sensitive     CEFTRIAXONE <=0.25 SENSITIVE Sensitive     CIPROFLOXACIN <=0.25 SENSITIVE Sensitive     GENTAMICIN <=1 SENSITIVE Sensitive     IMIPENEM 2 SENSITIVE Sensitive     NITROFURANTOIN 128 RESISTANT Resistant     TRIMETH/SULFA <=20 SENSITIVE Sensitive      AMPICILLIN/SULBACTAM <=2 SENSITIVE Sensitive     PIP/TAZO <=4 SENSITIVE Sensitive     * >=100,000 COLONIES/mL PROTEUS MIRABILIS  Resp Panel by RT-PCR (Flu A&B, Covid) Nasopharyngeal Swab     Status: None   Collection Time: 08/01/20  2:44 AM   Specimen: Nasopharyngeal Swab; Nasopharyngeal(NP) swabs in vial transport medium  Result Value Ref Range Status   SARS Coronavirus 2 by RT PCR NEGATIVE NEGATIVE Final    Comment: (NOTE) SARS-CoV-2 target nucleic acids are NOT DETECTED.  The SARS-CoV-2 RNA is generally detectable in upper respiratory specimens during the acute phase of infection. The lowest concentration of SARS-CoV-2 viral copies this assay can detect is 138 copies/mL. A negative result does not preclude SARS-Cov-2 infection and should not be used as the sole basis for treatment or other patient management decisions. A negative result may occur with  improper specimen collection/handling, submission of specimen other than nasopharyngeal swab, presence of viral mutation(s) within the areas targeted by this assay, and inadequate number of viral copies(<138 copies/mL). A negative result must be combined with clinical observations, patient history, and epidemiological information. The expected result is Negative.  Fact Sheet for Patients:  EntrepreneurPulse.com.au  Fact Sheet for Healthcare Providers:  IncredibleEmployment.be  This test is no t yet approved or cleared by the [place] FDA and  has been authorized for detection and/or diagnosis of SARS-CoV-2 by FDA under an Emergency Use Authorization (EUA). This EUA will remain  in effect (meaning this test can be used) for the duration of the COVID-19 declaration under Section 564(b)(1) of the Act, 21 U.S.C.section 360bbb-3(b)(1), unless the authorization is terminated  or revoked sooner.       Influenza A by PCR NEGATIVE NEGATIVE Final   Influenza B by PCR NEGATIVE NEGATIVE Final     Comment: (NOTE) The Xpert Xpress SARS-CoV-2/FLU/RSV plus assay is intended as an  aid in the diagnosis of influenza from Nasopharyngeal swab specimens and should not be used as a sole basis for treatment. Nasal washings and aspirates are unacceptable for Xpert Xpress SARS-CoV-2/FLU/RSV testing.  Fact Sheet for Patients: EntrepreneurPulse.com.au  Fact Sheet for Healthcare Providers: IncredibleEmployment.be  This test is not yet approved or cleared by the [place] FDA and has been authorized for detection and/or diagnosis of SARS-CoV-2 by FDA under an Emergency Use Authorization (EUA). This EUA will remain in effect (meaning this test can be used) for the duration of the COVID-19 declaration under Section 564(b)(1) of the Act, 21 U.S.C. section 360bbb-3(b)(1), unless the authorization is terminated or revoked.  Performed at [facility], [place] [address] [place], [place] [address]   Culture, blood (Routine X 2) w Reflex to ID Panel     Status: None (Preliminary result)   Collection Time: 08/02/20 12:11 AM   Specimen: BLOOD RIGHT HAND  Result Value Ref Range Status   Specimen Description BLOOD RIGHT HAND  Final   Special Requests   Final    BOTTLES DRAWN AEROBIC ONLY Blood Culture adequate volume   Culture   Final    NO GROWTH 2 DAYS Performed at [facility], [place] [address], [place], [place] [address]    Report Status PENDING  Incomplete    No results for input(s): LIPASE, AMYLASE in the last 168 hours. Recent Labs  Lab 08/01/20 0600  AMMONIA 57*    Cardiac Enzymes: No results for input(s): CKTOTAL, CKMB, CKMBINDEX, TROPONINI in the last 168 hours. BNP (last 3 results) Recent Labs    01/31/20 2100  BNP 43.7    ProBNP (last 3 results) No results for input(s): PROBNP in the last 8760 hours.  Studies:  No results found.     Oswald Hillock   [facility] If 7PM-7AM, please contact night-coverage at  [email or web address], Office  [phone]   08/04/2020, 11:59 AM  LOS: 3 days

## 2020-08-05 NOTE — Progress Notes (Signed)
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  Came to ED with complaints of chest pain, generalized weakness and failure to thrive Patient admitted with sepsis due to UTI.  Found to have abnormal urine.  Started on ceftriaxone.  Urine and blood cultures obtained.   Subjective   Patient seen and examined, denies any complaints.    Assessment/Plan:     1. Severe sepsis due to UTI-patient presented with lactic acid 2.6, hypotension.  Sepsis physiology has resolved.  Patient started on IV ceftriaxone, urine culture growing greater than 100,000 colonies per mL Proteus mirabilis.  Sensitive to ceftriaxone, cefazolin.  IV ceftriaxone was discontinued and patient started on p.o. Keflex.  2. Metabolic encephalopathy-likely due to sepsis from UTI as well as underlying dementia.  Significantly improved.  Likely at baseline.  Continue with  antibiotics as above. 3. Hypothermia-patient presented with hypothermia, likely from underlying severe sepsis as above.  Bair hugger was provided.  Hypothermia has at this time resolved.  TSH 2.793, cortisol 17.1. 4. Mild transaminitis-patient presented with mildly elevated AST and ALT, likely from sepsis as above.  Slowly improving.  Follow LFTs in a.m. 5. Stage III non-small cell lung cancer/small cell carcinoma-followed by oncology as outpatient. 6. Paroxysmal atrial fibrillation-currently in normal sinus rhythm, continue Eliquis. 7. History of pulmonary embolism-continue Eliquis     COVID-19 Labs  No results for input(s): DDIMER, FERRITIN, LDH, CRP in the last 72 hours.  Lab Results   Component Value Date   SARSCOV2NAA NEGATIVE 08/01/2020   SARSCOV2NAA NEGATIVE 05/14/2020   SARSCOV2NAA NEGATIVE 05/09/2020   SARSCOV2NAA POSITIVE (A) 02/16/2020     Scheduled medications:   . apixaban  5 mg Oral BID  . cephALEXin  500 mg Oral Q12H         CBG: No results for input(s): GLUCAP in the last 168 hours.  SpO2: 90 %    CBC: Recent Labs  Lab 07/31/20 2040 08/01/20 0600 08/02/20 0011  WBC 4.4 5.2 4.8  HGB 10.8* 10.0* 10.4*  HCT 33.8* 31.5* 32.2*  MCV 61.1* 61.0* 60.3*  PLT 230 205 397    Basic Metabolic Panel: Recent Labs  Lab 07/31/20 2040 08/01/20 0600 08/02/20 0011 08/03/20 0150  NA 141 142 143 137  K 4.6 5.2* 4.2 4.0  CL 106 111 110 107  CO2 24 22 23 22   GLUCOSE 158* 73 82 73  BUN 19 20 20 19   CREATININE 0.64 0.67 1.15 0.78  CALCIUM 9.7 9.5 9.3 9.0     Liver Function Tests: Recent Labs  Lab 07/31/20 2255 08/01/20 0600 08/02/20 0011 08/03/20 0150  AST 80* 68* 55* 40  ALT 64* 57* 51* 42  ALKPHOS 113 100 98 93  BILITOT 0.8 0.7 0.5 0.8  PROT 6.8 5.7* 6.1* 5.8*  ALBUMIN 3.3* 2.9* 2.9* 2.8*     Antibiotics: Anti-infectives (From admission, onward)   Start     Dose/Rate Route Frequency Ordered Stop   08/05/20 0800  cephALEXin (KEFLEX) capsule 500 mg        500 mg Oral Every 12 hours 08/04/20 1204     08/01/20 1000  vancomycin (VANCOREADY) IVPB 1000 mg/200 mL  Status:  Discontinued  1,000 mg 200 mL/hr over 60 Minutes Intravenous Every 12 hours 08/01/20 0238 08/01/20 0516   08/01/20 0600  ceFEPIme (MAXIPIME) 2 g in sodium chloride 0.9 % 100 mL IVPB  Status:  Discontinued        2 g 200 mL/hr over 30 Minutes Intravenous Every 8 hours 08/01/20 0238 08/01/20 0516   08/01/20 0520  cefTRIAXone (ROCEPHIN) 1 g in sodium chloride 0.9 % 100 mL IVPB  Status:  Discontinued        1 g 200 mL/hr over 30 Minutes Intravenous Daily 08/01/20 0516 08/04/20 1204   08/01/20 0100  ceFEPIme (MAXIPIME) 2 g in sodium chloride 0.9 % 100 mL IVPB         2 g 200 mL/hr over 30 Minutes Intravenous  Once 08/01/20 0050 08/01/20 0223   08/01/20 0100  metroNIDAZOLE (FLAGYL) IVPB 500 mg        500 mg 100 mL/hr over 60 Minutes Intravenous  Once 08/01/20 0050 08/01/20 0223   08/01/20 0100  vancomycin (VANCOREADY) IVPB 1000 mg/200 mL        1,000 mg 200 mL/hr over 60 Minutes Intravenous  Once 08/01/20 0050 08/01/20 0324       DVT prophylaxis: Apixaban  Code Status: Full code  Family Communication: No family at bedside   Consultants:    Procedures:      Objective   Vitals:   08/05/20 0314 08/05/20 0400 08/05/20 0600 08/05/20 0700  BP: 138/75 (!) 144/80 135/68   Pulse: (!) 104     Resp: 19 17 18 14   Temp: 98.1 F (36.7 C)     TempSrc: Oral     SpO2: 90%       Intake/Output Summary (Last 24 hours) at 08/05/2020 0841 Last data filed at 08/05/2020 0700 Gross per 24 hour  Intake 2775.56 ml  Output 1800 ml  Net 975.56 ml    02/18 1901 - 02/20 0700 In: 4710.9 [P.O.:600; I.V.:4110.9] Out: 5200 [Urine:5200]  There were no vitals filed for this visit.  Physical Examination:  General-appears in no acute distress Heart-S1-S2, regular, no murmur auscultated Lungs-clear to auscultation bilaterally, no wheezing or crackles auscultated Abdomen-soft, nontender, no organomegaly Extremities-no edema in the lower extremities Neuro-alert, oriented x3, no focal deficit noted Status is: Inpatient  Dispo: The patient is from: Home              Anticipated d/c is [facility] nursing facility              Anticipated d/c date is: 08/06/2020              Patient currently medically stable for discharge  Barrier to discharge-awaiting bed at skilled nursing facility  Pressure Injury 05/19/19 Hip Left;Lower Stage II -  Partial thickness loss of dermis presenting as a shallow open ulcer with a red, pink wound bed without slough. Stage II present with scab over it (Active)  05/19/19 0348  Location: Hip  Location Orientation:  Left;Lower  Staging: Stage II -  Partial thickness loss of dermis presenting as a shallow open ulcer with a red, pink wound bed without slough.  Wound Description (Comments): Stage II present with scab over it  Present on Admission: Yes     Pressure Injury 05/19/19 Hip Right;Left Stage I -  Intact skin with non-blanchable redness of a localized area usually over a bony prominence. (Active)  05/19/19 0355  Location: Hip  Location Orientation: Right;Left  Staging: Stage I -  Intact skin with non-blanchable redness of  a localized area usually over a bony prominence.  Wound Description (Comments):   Present on Admission:            Data Reviewed:   Recent Results (from the past 240 hour(s))  Culture, blood (routine x 2)     Status: None (Preliminary result)   Collection Time: 08/01/20  1:05 AM   Specimen: BLOOD  Result Value Ref Range Status   Specimen Description BLOOD LEFT FOREARM  Final   Special Requests   Final    BOTTLES DRAWN AEROBIC AND ANAEROBIC Blood Culture adequate volume   Culture   Final    NO GROWTH 3 DAYS Performed at [facility], [address], [place], [place] [address]    Report Status PENDING  Incomplete  Urine culture     Status: Abnormal   Collection Time: 08/01/20  2:20 AM   Specimen: Urine, Random  Result Value Ref Range Status   Specimen Description URINE, RANDOM  Final   Special Requests   Final    NONE Performed at [facility], [place] [address] [place], [place] [address]    Culture >=100,000 COLONIES/mL PROTEUS MIRABILIS (A)  Final   Report Status 08/03/2020 FINAL  Final   Organism ID, Bacteria PROTEUS MIRABILIS (A)  Final      Susceptibility   Proteus mirabilis - MIC*    AMPICILLIN <=2 SENSITIVE Sensitive     CEFAZOLIN <=4 SENSITIVE Sensitive     CEFEPIME <=0.12 SENSITIVE Sensitive     CEFTRIAXONE <=0.25 SENSITIVE Sensitive     CIPROFLOXACIN <=0.25 SENSITIVE Sensitive     GENTAMICIN <=1 SENSITIVE Sensitive     IMIPENEM 2  SENSITIVE Sensitive     NITROFURANTOIN 128 RESISTANT Resistant     TRIMETH/SULFA <=20 SENSITIVE Sensitive     AMPICILLIN/SULBACTAM <=2 SENSITIVE Sensitive     PIP/TAZO <=4 SENSITIVE Sensitive     * >=100,000 COLONIES/mL PROTEUS MIRABILIS  Resp Panel by RT-PCR (Flu A&B, Covid) Nasopharyngeal Swab     Status: None   Collection Time: 08/01/20  2:44 AM   Specimen: Nasopharyngeal Swab; Nasopharyngeal(NP) swabs in vial transport medium  Result Value Ref Range Status   SARS Coronavirus 2 by RT PCR NEGATIVE NEGATIVE Final    Comment: (NOTE) SARS-CoV-2 target nucleic acids are NOT DETECTED.  The SARS-CoV-2 RNA is generally detectable in upper respiratory specimens during the acute phase of infection. The lowest concentration of SARS-CoV-2 viral copies this assay can detect is 138 copies/mL. A negative result does not preclude SARS-Cov-2 infection and should not be used as the sole basis for treatment or other patient management decisions. A negative result may occur with  improper specimen collection/handling, submission of specimen other than nasopharyngeal swab, presence of viral mutation(s) within the areas targeted by this assay, and inadequate number of viral copies(<138 copies/mL). A negative result must be combined with clinical observations, patient history, and epidemiological information. The expected result is Negative.  Fact Sheet for Patients:  EntrepreneurPulse.com.au  Fact Sheet for Healthcare Providers:  IncredibleEmployment.be  This test is no t yet approved or cleared by the [place] FDA and  has been authorized for detection and/or diagnosis of SARS-CoV-2 by FDA under an Emergency Use Authorization (EUA). This EUA will remain  in effect (meaning this test can be used) for the duration of the COVID-19 declaration under Section 564(b)(1) of the Act, 21 U.S.C.section 360bbb-3(b)(1), unless the authorization is terminated  or  revoked sooner.       Influenza A  by PCR NEGATIVE NEGATIVE Final   Influenza B by PCR NEGATIVE NEGATIVE Final    Comment: (NOTE) The Xpert Xpress SARS-CoV-2/FLU/RSV plus assay is intended as an aid in the diagnosis of influenza from Nasopharyngeal swab specimens and should not be used as a sole basis for treatment. Nasal washings and aspirates are unacceptable for Xpert Xpress SARS-CoV-2/FLU/RSV testing.  Fact Sheet for Patients: EntrepreneurPulse.com.au  Fact Sheet for Healthcare Providers: IncredibleEmployment.be  This test is not yet approved or cleared by the [place] FDA and has been authorized for detection and/or diagnosis of SARS-CoV-2 by FDA under an Emergency Use Authorization (EUA). This EUA will remain in effect (meaning this test can be used) for the duration of the COVID-19 declaration under Section 564(b)(1) of the Act, 21 U.S.C. section 360bbb-3(b)(1), unless the authorization is terminated or revoked.  Performed at [facility], [place] [address], [place], [place] [address]   Culture, blood (Routine X 2) w Reflex to ID Panel     Status: None (Preliminary result)   Collection Time: 08/02/20 12:11 AM   Specimen: BLOOD RIGHT HAND  Result Value Ref Range Status   Specimen Description BLOOD RIGHT HAND  Final   Special Requests   Final    BOTTLES DRAWN AEROBIC ONLY Blood Culture adequate volume   Culture   Final    NO GROWTH 2 DAYS Performed at [facility], [place] [address], [place], [place] [address]    Report Status PENDING  Incomplete    No results for input(s): LIPASE, AMYLASE in the last 168 hours. Recent Labs  Lab 08/01/20 0600  AMMONIA 57*    Cardiac Enzymes: No results for input(s): CKTOTAL, CKMB, CKMBINDEX, TROPONINI in the last 168 hours. BNP (last 3 results) Recent Labs    01/31/20 2100  BNP 43.7    ProBNP (last 3 results) No results for input(s): PROBNP in the last 8760  hours.  Studies:  No results found.     Oswald Hillock   [facility] If 7PM-7AM, please contact night-coverage at [email or web address], Office  [phone]   08/05/2020, 8:41 AM  LOS: 4 days

## 2020-08-06 LAB — CULTURE, BLOOD (ROUTINE X 2)
Culture: NO GROWTH
Special Requests: ADEQUATE

## 2020-08-06 NOTE — Plan of Care (Signed)

## 2020-08-06 NOTE — Discharge Instructions (Signed)

## 2020-08-06 NOTE — Evaluation (Signed)
Occupational Therapy Evaluation Patient Details Name: Richard [name] MRN: [id number] DOB: 1941-03-12 Today's Date: 08/06/2020    History of Present Illness The pt is a 80 yo male presenting with generalized weakness and failure to thrive. Upon workup, pt found to have sepsis due to UTI. 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   Clinical Impression   PTA, pt was from home with daughter and pt reports he performed BADLs; however, pt poor historian and little is documented on home situation. Pt currently requiring Min A for UB ADLs, Max A for LB ADLs, and Min A for bed mobility. Pt would benefit from further acute OT to facilitate safe dc. Recommend dc to SNF for further OT to optimize safety, independence with ADLs, and return to PLOF.     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 Cues for sequencing. Min Guard A for safety with return to sidelying    Transfers                 General transfer comment: Defered for safety    Balance Overall balance assessment: Needs assistance Sitting-balance support: No upper extremity supported;Feet supported Sitting balance-Leahy Scale: Fair                                     ADL either performed or assessed with clinical judgement   ADL Overall ADL's : Needs assistance/impaired Eating/Feeding: Set up;Bed level;Sitting   Grooming: Oral  care;Wash/dry face;Set up;Supervision/safety;Sitting Grooming Details (indicate cue type and reason): Pt sitting at EOB with Supervision and washing his face with cues for sequencing. Min A for performing oral care such as holding spit cup in place or changing tooth brush and tooth paste. Upper Body Bathing: Minimal assistance;Sitting   Lower Body Bathing: Maximal assistance;Sitting/lateral leans;Bed level   Upper Body Dressing : Minimal assistance;Sitting   Lower Body Dressing: Maximal assistance;Sitting/lateral leans;Bed level                 General ADL Comments: Pt presenting with decreased strength, cognition, and activity tolerance.     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 able to perform hip  flexion and ankle movements, but very weak   Cervical / Trunk Assessment Cervical / Trunk Assessment: Kyphotic   Communication Communication Communication: No difficulties   Cognition Arousal/Alertness: Awake/alert Behavior During Therapy: WFL for tasks assessed/performed Overall Cognitive Status: History of cognitive impairments - at baseline                                 General Comments:  Baseline dementia. Pt pleasantly agreeable to therapy and following simple commands. Pt's responce to majority of conversation or commands is "oh shit" but then willing to participate.   General Comments  VSS on RA.    Exercises     Shoulder Instructions      Home Living Family/patient expects to be discharged to:: Private residence                                 Additional Comments: Pt reporting he lives with his twin brother. Also stating he is ready to go home and "my momma is going to come pick me up soon."      Prior Functioning/Environment Level of Independence: Needs assistance        Comments: Pt reporting that he does BADLs but he has difficulty getting to a w/c because of "these damn legs"        OT Problem List: Decreased range of motion;Decreased activity tolerance;Decreased strength;Impaired balance (sitting and/or standing);Decreased knowledge of use of DME or AE;Decreased knowledge of precautions      OT Treatment/Interventions: Self-care/ADL training;Therapeutic exercise;Energy conservation;DME and/or AE instruction;Therapeutic activities;Patient/family education    OT Goals(Current goals can be found in the care plan section) Acute Rehab OT Goals Patient Stated Goal: "I am ready to go home" OT Goal Formulation: With patient Time For Goal Achievement: 08/20/20 Potential to Achieve Goals: Good  OT Frequency: Min 2X/week   Barriers to D/C:            Co-evaluation              AM-PAC OT "6 Clicks" Daily Activity     Outcome Measure Help from another person eating meals?: A Little Help from another person taking care of personal grooming?: A Little Help from another person toileting, which includes using toliet, bedpan, or urinal?: A Lot Help from another person bathing (including washing, rinsing, drying)?: A Lot Help from another person to put on and taking off regular upper body clothing?: A Little Help from another person to put  on and taking off regular lower body clothing?: A Lot 6 Click Score: 15   End of Session Nurse Communication: Mobility status  Activity Tolerance: Patient tolerated treatment well Patient left: in bed;with call bell/phone within reach;with bed alarm set  OT Visit Diagnosis: Other abnormalities of gait and mobility (R26.89);Unsteadiness on feet (R26.81);Muscle weakness (generalized) (M62.81);Pain Pain - part of body: Leg                Time: 3244-0102 OT Time Calculation (min): 16 min Charges:  OT General Charges $OT Visit: 1 Visit OT Evaluation $OT Eval Low Complexity: 1 Low  Rayvin Abid MSOT, OTR/L Acute Rehab Pager: [phone] Office: [place] 08/06/2020, 8:45 AM

## 2020-08-06 NOTE — NC FL2 (Signed)
[place] MEDICAID FL2 LEVEL OF CARE SCREENING TOOL     IDENTIFICATION  Patient Name: Richard [name] Birthdate: April 08, 1941 Sex: male Admission Date (Current Location): 07/31/2020  [facility] and [place] Number:  [name] and Address:  [facility], [place] [address], [place], [place] [address]      Provider Number: [id number]  Attending Physician Name and Address:  Oswald Hillock, MD  Relative Name and Phone Number:       Current Level of Care: Hospital Recommended Level of Care: [place] Prior Approval Number:    Date Approved/Denied:   [name]: [id number]  Discharge Plan: SNF    Current Diagnoses: Patient Active Problem List   Diagnosis Date Noted  . Acute metabolic encephalopathy 61/60/7371  . Chest pain 08/01/2020  . Severe sepsis ([place]) 05/09/2020  . Altered level of consciousness 05/09/2020  . Hypothermia 05/09/2020  . UTI (urinary tract infection) 05/09/2020  . Acute respiratory disease due to COVID-19 virus 02/17/2020  . Acute respiratory failure due to COVID-19 ([place]) 02/17/2020  . Syncope 11/04/2019  . Fall at home, initial encounter 11/04/2019  . Anemia 09/25/2019  . Symptomatic anemia 09/24/2019  . Paroxysmal atrial fibrillation ([place]) 07/06/2019  . Pressure injury of skin 05/21/2019  . Anorexia 05/20/2019  . HCAP (healthcare-associated pneumonia) 05/18/2019  . Protein-calorie malnutrition, severe ([place]) 05/18/2019  . Sepsis ([place]) 05/08/2019  . History of pulmonary embolism 03/30/2019  . Pure hypercholesterolemia 03/30/2019  . SOB (shortness of breath) 03/30/2019  . Palliative care by specialist   . DNR (do not resuscitate) discussion   . Poor appetite 01/05/2019  . Goals of care, counseling/discussion 12/28/2018  . Encounter for antineoplastic chemotherapy 12/28/2018  . Stage III squamous cell carcinoma of right lung ([place]) 12/28/2018  . Dementia without behavioral disturbance ([place]) 12/16/2018  . Lung  mass 12/15/2018  . Pelvic mass in male 12/15/2018  . Postobstructive pneumonia 12/15/2018  . Hypertension     Orientation RESPIRATION BLADDER Height & Weight        Normal Incontinent Weight:   Height:     BEHAVIORAL SYMPTOMS/MOOD NEUROLOGICAL BOWEL NUTRITION STATUS      Incontinent Diet (please see discharge summary)  AMBULATORY STATUS COMMUNICATION OF NEEDS Skin   Total Care (wheelchair bound) Verbally Normal                       Personal Care Assistance Level of Assistance  Bathing,Feeding,Dressing Bathing Assistance: Maximum assistance Feeding assistance: Limited assistance Dressing Assistance: Maximum assistance     Functional Limitations Info  Sight,Hearing,Speech Sight Info: Adequate Hearing Info: Adequate Speech Info: Adequate    SPECIAL CARE FACTORS FREQUENCY  PT (By licensed PT),OT (By licensed OT)     PT Frequency: 5x per week OT Frequency: 5x per week            Contractures Contractures Info: Not present    Additional Factors Info  Code Status,Allergies Code Status Info: FULL Allergies Info: NKA           Current Medications (08/06/2020):  This is the current hospital active medication list Current Facility-Administered Medications  Medication Dose Route Frequency Provider Last Rate Last Admin  . 0.9 %  sodium chloride infusion   Intravenous Continuous Oswald Hillock, MD 125 mL/hr at 08/06/20 0940 New Bag at 08/06/20 0940  . apixaban (ELIQUIS) tablet 5 mg  5 mg Oral BID Shela Leff, MD   5 mg at 08/06/20 0940  . cephALEXin (  KEFLEX) capsule 500 mg  500 mg Oral Q12H Oswald Hillock, MD   500 mg at 08/06/20 0404     Discharge Medications: Please see discharge summary for a list of discharge medications.  Relevant Imaging Results:  Relevant Lab Results:   Additional Information SSN [id number]   [place] COVID-19 Vaccine 04/24/2020 , 04/04/2020 - no booster shot  Vinie Sill, [place]

## 2020-08-06 NOTE — Progress Notes (Signed)
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  Came to ED with complaints of chest pain, generalized weakness and failure to thrive Patient admitted with sepsis due to UTI.  Found to have abnormal urine.  Started on ceftriaxone.  Urine and blood cultures obtained.   Subjective   Patient seen and examined, denies any new complaints.  Awaiting placement at skilled facility.    Assessment/Plan:     1. Severe sepsis due to UTI-patient presented with lactic acid 2.6, hypotension.  Sepsis physiology has resolved.  Patient started on IV ceftriaxone, urine culture growing greater than 100,000 colonies per mL Proteus mirabilis.  Sensitive to ceftriaxone, cefazolin.  IV ceftriaxone was discontinued and patient started on p.o. Keflex.  2. Metabolic encephalopathy-likely due to sepsis from UTI as well as underlying dementia.  Significantly improved.  Likely at baseline.  Continue with  antibiotics as above. 3. Hypothermia-patient presented with hypothermia, likely from underlying severe sepsis as above.  Bair hugger was provided.  Hypothermia has at this time resolved.  TSH 2.793, cortisol 17.1. 4. Mild transaminitis-patient presented with mildly elevated AST and ALT, likely from sepsis as above.  Slowly improving.  Follow LFTs in a.m. 5. Stage III non-small cell lung cancer/small cell carcinoma-followed by oncology as outpatient. 6. Paroxysmal atrial fibrillation-currently in normal sinus rhythm, continue Eliquis. 7. History of pulmonary embolism-continue Eliquis     COVID-19 Labs  No results for input(s): DDIMER, FERRITIN, LDH,  CRP in the last 72 hours.  Lab Results  Component Value Date   SARSCOV2NAA NEGATIVE 08/01/2020   SARSCOV2NAA NEGATIVE 05/14/2020   SARSCOV2NAA NEGATIVE 05/09/2020   SARSCOV2NAA POSITIVE (A) 02/16/2020     Scheduled medications:   . apixaban  5 mg Oral BID  . cephALEXin  500 mg Oral Q12H         CBG: No results for input(s): GLUCAP in the last 168 hours.  SpO2: 96 %    CBC: Recent Labs  Lab 07/31/20 2040 08/01/20 0600 08/02/20 0011  WBC 4.4 5.2 4.8  HGB 10.8* 10.0* 10.4*  HCT 33.8* 31.5* 32.2*  MCV 61.1* 61.0* 60.3*  PLT 230 205 626    Basic Metabolic Panel: Recent Labs  Lab 07/31/20 2040 08/01/20 0600 08/02/20 0011 08/03/20 0150  NA 141 142 143 137  K 4.6 5.2* 4.2 4.0  CL 106 111 110 107  CO2 24 22 23 22   GLUCOSE 158* 73 82 73  BUN 19 20 20 19   CREATININE 0.64 0.67 1.15 0.78  CALCIUM 9.7 9.5 9.3 9.0     Liver Function Tests: Recent Labs  Lab 07/31/20 2255 08/01/20 0600 08/02/20 0011 08/03/20 0150  AST 80* 68* 55* 40  ALT 64* 57* 51* 42  ALKPHOS 113 100 98 93  BILITOT 0.8 0.7 0.5 0.8  PROT 6.8 5.7* 6.1* 5.8*  ALBUMIN 3.3* 2.9* 2.9* 2.8*     Antibiotics: Anti-infectives (From admission, onward)   Start     Dose/Rate Route Frequency Ordered Stop   08/05/20 0800  cephALEXin (KEFLEX) capsule 500 mg        500 mg Oral Every 12 hours 08/04/20 1204     08/01/20 1000  vancomycin (VANCOREADY) IVPB 1000  mg/200 mL  Status:  Discontinued        1,000 mg 200 mL/hr over 60 Minutes Intravenous Every 12 hours 08/01/20 0238 08/01/20 0516   08/01/20 0600  ceFEPIme (MAXIPIME) 2 g in sodium chloride 0.9 % 100 mL IVPB  Status:  Discontinued        2 g 200 mL/hr over 30 Minutes Intravenous Every 8 hours 08/01/20 0238 08/01/20 0516   08/01/20 0520  cefTRIAXone (ROCEPHIN) 1 g in sodium chloride 0.9 % 100 mL IVPB  Status:  Discontinued        1 g 200 mL/hr over 30 Minutes Intravenous Daily 08/01/20 0516 08/04/20 1204   08/01/20 0100  ceFEPIme (MAXIPIME) 2  g in sodium chloride 0.9 % 100 mL IVPB        2 g 200 mL/hr over 30 Minutes Intravenous  Once 08/01/20 0050 08/01/20 0223   08/01/20 0100  metroNIDAZOLE (FLAGYL) IVPB 500 mg        500 mg 100 mL/hr over 60 Minutes Intravenous  Once 08/01/20 0050 08/01/20 0223   08/01/20 0100  vancomycin (VANCOREADY) IVPB 1000 mg/200 mL        1,000 mg 200 mL/hr over 60 Minutes Intravenous  Once 08/01/20 0050 08/01/20 0324       DVT prophylaxis: Apixaban  Code Status: Full code  Family Communication: Spoke to patient's daughter on phone   Consultants:    Procedures:      Objective   Vitals:   08/05/20 1630 08/05/20 1945 08/05/20 2300 08/06/20 0352  BP: 126/83 (!) 145/82 (!) 138/59 (!) 123/57  Pulse: 84 88 75 82  Resp: (!) 22 19 18 15   Temp: 97.6 F (36.4 C) 98 F (36.7 C) 98.1 F (36.7 C) 97.6 F (36.4 C)  TempSrc:  Oral Oral Axillary  SpO2: 97% 98% 98% 96%    Intake/Output Summary (Last 24 hours) at 08/06/2020 1041 Last data filed at 08/06/2020 4403 Gross per 24 hour  Intake 3192.14 ml  Output 250 ml  Net 2942.14 ml    02/19 1901 - 02/21 0700 In: 4932.1 [P.O.:600; I.V.:4332.1] Out: 900 [Urine:900]  There were no vitals filed for this visit.  Physical Examination: General-appears in no acute distress Heart-S1-S2, regular, no murmur auscultated Lungs-clear to auscultation bilaterally, no wheezing or crackles auscultated Abdomen-soft, nontender, no organomegaly Extremities-no edema in the lower extremities Neuro-alert, oriented to self and place, no focal deficit noted  Status is: Inpatient  Dispo: The patient is from: Home              Anticipated d/c is [id number] nursing facility              Anticipated d/c date is: 08/08/2020              Patient currently medically stable for discharge  Barrier to discharge-awaiting bed at skilled nursing facility  Pressure Injury 05/19/19 Hip Left;Lower Stage II -  Partial thickness loss of dermis presenting as a shallow  open ulcer with a red, pink wound bed without slough. Stage II present with scab over it (Active)  05/19/19 0348  Location: Hip  Location Orientation: Left;Lower  Staging: Stage II -  Partial thickness loss of dermis presenting as a shallow open ulcer with a red, pink wound bed without slough.  Wound Description (Comments): Stage II present with scab over it  Present on Admission: Yes     Pressure Injury 05/19/19 Hip Right;Left Stage I -  Intact skin with non-blanchable redness of a localized  area usually over a bony prominence. (Active)  05/19/19 0355  Location: Hip  Location Orientation: Right;Left  Staging: Stage I -  Intact skin with non-blanchable redness of a localized area usually over a bony prominence.  Wound Description (Comments):   Present on Admission:            Data Reviewed:   Recent Results (from the past 240 hour(s))  Culture, blood (routine x 2)     Status: None   Collection Time: 08/01/20  1:05 AM   Specimen: BLOOD  Result Value Ref Range Status   Specimen Description BLOOD LEFT FOREARM  Final   Special Requests   Final    BOTTLES DRAWN AEROBIC AND ANAEROBIC Blood Culture adequate volume   Culture   Final    NO GROWTH 5 DAYS Performed at [facility], [address] [place], [place] [address]    Report Status 08/06/2020 FINAL  Final  Urine culture     Status: Abnormal   Collection Time: 08/01/20  2:20 AM   Specimen: Urine, Random  Result Value Ref Range Status   Specimen Description URINE, RANDOM  Final   Special Requests   Final    NONE Performed at [facility], [place] [address], [place], [place] [address]    Culture >=100,000 COLONIES/mL PROTEUS MIRABILIS (A)  Final   Report Status 08/03/2020 FINAL  Final   Organism ID, Bacteria PROTEUS MIRABILIS (A)  Final      Susceptibility   Proteus mirabilis - MIC*    AMPICILLIN <=2 SENSITIVE Sensitive     CEFAZOLIN <=4 SENSITIVE Sensitive     CEFEPIME <=0.12 SENSITIVE Sensitive      CEFTRIAXONE <=0.25 SENSITIVE Sensitive     CIPROFLOXACIN <=0.25 SENSITIVE Sensitive     GENTAMICIN <=1 SENSITIVE Sensitive     IMIPENEM 2 SENSITIVE Sensitive     NITROFURANTOIN 128 RESISTANT Resistant     TRIMETH/SULFA <=20 SENSITIVE Sensitive     AMPICILLIN/SULBACTAM <=2 SENSITIVE Sensitive     PIP/TAZO <=4 SENSITIVE Sensitive     * >=100,000 COLONIES/mL PROTEUS MIRABILIS  Resp Panel by RT-PCR (Flu A&B, Covid) Nasopharyngeal Swab     Status: None   Collection Time: 08/01/20  2:44 AM   Specimen: Nasopharyngeal Swab; Nasopharyngeal(NP) swabs in vial transport medium  Result Value Ref Range Status   SARS Coronavirus 2 by RT PCR NEGATIVE NEGATIVE Final    Comment: (NOTE) SARS-CoV-2 target nucleic acids are NOT DETECTED.  The SARS-CoV-2 RNA is generally detectable in upper respiratory specimens during the acute phase of infection. The lowest concentration of SARS-CoV-2 viral copies this assay can detect is 138 copies/mL. A negative result does not preclude SARS-Cov-2 infection and should not be used as the sole basis for treatment or other patient management decisions. A negative result may occur with  improper specimen collection/handling, submission of specimen other than nasopharyngeal swab, presence of viral mutation(s) within the areas targeted by this assay, and inadequate number of viral copies(<138 copies/mL). A negative result must be combined with clinical observations, patient history, and epidemiological information. The expected result is Negative.  Fact Sheet for Patients:  EntrepreneurPulse.com.au  Fact Sheet for Healthcare Providers:  IncredibleEmployment.be  This test is no t yet approved or cleared by the [place] FDA and  has been authorized for detection and/or diagnosis of SARS-CoV-2 by FDA under an Emergency Use Authorization (EUA). This EUA will remain  in effect (meaning this test can be used) for the duration of  the  COVID-19 declaration under Section 564(b)(1) of the Act, 21 U.S.C.section 360bbb-3(b)(1), unless the authorization is terminated  or revoked sooner.       Influenza A by PCR NEGATIVE NEGATIVE Final   Influenza B by PCR NEGATIVE NEGATIVE Final    Comment: (NOTE) The Xpert Xpress SARS-CoV-2/FLU/RSV plus assay is intended as an aid in the diagnosis of influenza from Nasopharyngeal swab specimens and should not be used as a sole basis for treatment. Nasal washings and aspirates are unacceptable for Xpert Xpress SARS-CoV-2/FLU/RSV testing.  Fact Sheet for Patients: EntrepreneurPulse.com.au  Fact Sheet for Healthcare Providers: IncredibleEmployment.be  This test is not yet approved or cleared by the [place] FDA and has been authorized for detection and/or diagnosis of SARS-CoV-2 by FDA under an Emergency Use Authorization (EUA). This EUA will remain in effect (meaning this test can be used) for the duration of the COVID-19 declaration under Section 564(b)(1) of the Act, 21 U.S.C. section 360bbb-3(b)(1), unless the authorization is terminated or revoked.  Performed at [facility], [place] [address] [place], [place] [address]   Culture, blood (Routine X 2) w Reflex to ID Panel     Status: None (Preliminary result)   Collection Time: 08/02/20 12:11 AM   Specimen: BLOOD RIGHT HAND  Result Value Ref Range Status   Specimen Description BLOOD RIGHT HAND  Final   Special Requests   Final    BOTTLES DRAWN AEROBIC ONLY Blood Culture adequate volume   Culture   Final    NO GROWTH 4 DAYS Performed at [facility], [place] [address] [place], [place] [address]    Report Status PENDING  Incomplete    No results for input(s): LIPASE, AMYLASE in the last 168 hours. Recent Labs  Lab 08/01/20 0600  AMMONIA 57*    Cardiac Enzymes: No results for input(s): CKTOTAL, CKMB, CKMBINDEX, TROPONINI in the last 168 hours. BNP (last 3  results) Recent Labs    01/31/20 2100  BNP 43.7    ProBNP (last 3 results) No results for input(s): PROBNP in the last 8760 hours.  Studies:  No results found.     Oswald Hillock   [facility] If 7PM-7AM, please contact night-coverage at [email or web address], Office  [phone]   08/06/2020, 10:41 AM  LOS: 5 days

## 2020-08-06 NOTE — Care Management Important Message (Signed)
Important Message  Patient Details  Name: Richard [name] MRN: [id number] Date of Birth: [birth year]   Medicare Important Message Given:  Yes  Patient left prior to IM delivery.  Will mailed IM to patient home address.     Tajuanna Burnett 08/06/2020, 3:44 PM

## 2020-08-06 NOTE — TOC Initial Note (Signed)
Transition of Care [facility]) - Initial/Assessment Note    Patient Details  Name: Richard [name] MRN: [id number] Date of Birth: [birth year]  [facility] CM/SW Contact:    Vinie Sill, [place] Phone Number: 08/06/2020, 12:48 PM  Clinical Narrative:                  CSW spoke with patient's daughter,[name]- CSW introduced self and explained role. She states patient lives in the home w/daugter and granddaughter.  CSW discussed PT recommendation of short term rehab at [facility]. She is agreeable to SNF placement. No preferred SNF but prefer one close to their home(does not drive).  CSW explained the SNF process. Patient has received covid vaccine- no booster shot.   CSW will provide bed offers once available  CSW will continue to follow and assist with discharge planning.  Thurmond Butts, MSW, LCSW Clinical Social Worker   Expected Discharge Plan: Skilled Nursing Facility Barriers to Discharge: Insurance Authorization,Continued Medical Work up,SNF Pending bed offer   Patient Goals and CMS Choice        Expected Discharge Plan and Services Expected Discharge Plan: [place] In-house Referral: Clinical Social Work     Living arrangements for the past 2 months: [place]                                      Prior Living Arrangements/Services Living arrangements for the past 2 months: Single Family Home Lives with:: [place] Patient language and need for interpreter reviewed:: No        Need for Family Participation in Patient Care: Yes (Comment) Care giver support system in place?: Yes (comment)   Criminal Activity/Legal Involvement Pertinent to Current Situation/Hospitalization: No - Comment as needed  Activities of Daily Living      Permission Sought/Granted                  Emotional Assessment   Attitude/Demeanor/Rapport: Unable to Assess Affect (typically observed): Unable to Assess    Alcohol / Substance Use: Not Applicable Psych Involvement: No (comment)  Admission diagnosis:  Hypothermia [T68.XXXA] UTI (urinary tract infection) [N39.0] Sepsis secondary to UTI ([place]) [A41.9, N39.0] Altered mental status, unspecified altered mental status type [R41.82] Patient Active Problem List   Diagnosis Date Noted  . Acute metabolic encephalopathy 29/51/8841  . Chest pain 08/01/2020  . Severe sepsis ([place]) 05/09/2020  . Altered level of consciousness 05/09/2020  . Hypothermia 05/09/2020  . UTI (urinary tract infection) 05/09/2020  . Acute respiratory disease due to COVID-19 virus 02/17/2020  . Acute respiratory failure due to COVID-19 ([place]) 02/17/2020  . Syncope 11/04/2019  . Fall at home, initial encounter 11/04/2019  . Anemia 09/25/2019  . Symptomatic anemia 09/24/2019  . Paroxysmal atrial fibrillation ([place]) 07/06/2019  . Pressure injury of skin 05/21/2019  . Anorexia 05/20/2019  . HCAP (healthcare-associated pneumonia) 05/18/2019  . Protein-calorie malnutrition, severe ([place]) 05/18/2019  . Sepsis ([place]) 05/08/2019  . History of pulmonary embolism 03/30/2019  . Pure hypercholesterolemia 03/30/2019  . SOB (shortness of breath) 03/30/2019  . Palliative care by specialist   . DNR (do not resuscitate) discussion   . Poor appetite 01/05/2019  . Goals of care, counseling/discussion 12/28/2018  . Encounter for antineoplastic chemotherapy 12/28/2018  . Stage III squamous cell carcinoma of right lung ([place]) 12/28/2018  . Dementia without behavioral disturbance ([place]) 12/16/2018  . Lung mass 12/15/2018  .  Pelvic mass in male 12/15/2018  . Postobstructive pneumonia 12/15/2018  . Hypertension    PCP:  [name], NP Pharmacy:   [place], [place] - [address] [place] [address] Phone: [phone] Fax: [phone]  [facility] [place]), [place] - [address] [place] [address] Phone: [phone] Fax: [place], [place] [address] Phone: [phone] Fax: [phone]     Social Determinants of Health ([place]) Interventions    Readmission Risk Interventions Readmission Risk Prevention Plan 05/30/2019  Transportation Screening Complete  Medication Review ([place]) Complete  PCP or Specialist appointment within 3-5 days of discharge Complete  HRI or [place] Complete  SW Recovery Care/Counseling Consult Complete  [place] Not Applicable  Some recent data might be hidden

## 2020-08-07 LAB — BASIC METABOLIC PANEL
Anion gap: 10 (ref 5–15)
BUN: 8 mg/dL (ref 8–23)
CO2: 21 mmol/L — ABNORMAL LOW (ref 22–32)
Calcium: 9 mg/dL (ref 8.9–10.3)
Chloride: 109 mmol/L (ref 98–111)
Creatinine, Ser: 0.78 mg/dL (ref 0.61–1.24)
GFR, Estimated: >60 mL/min (ref >60)
Glucose, Bld: 79 mg/dL (ref 70–99)
Potassium: 4 mmol/L (ref 3.5–5.1)
Sodium: 140 mmol/L (ref 135–145)

## 2020-08-07 LAB — CULTURE, BLOOD (ROUTINE X 2)
Culture: NO GROWTH
Special Requests: ADEQUATE

## 2020-08-07 LAB — MAGNESIUM: Magnesium: 1.6 mg/dL — ABNORMAL LOW (ref 1.7–2.4)

## 2020-08-07 LAB — SARS CORONAVIRUS 2 (TAT 6-24 HRS): SARS Coronavirus 2: NEGATIVE

## 2020-08-07 NOTE — TOC Progression Note (Signed)
Transition of Care [facility]) - Progression Note    Patient Details  Name: Richard [name] MRN: [id number] Date of Birth: 02-04-1941  Transition of Care [facility]) CM/SW Olivia Lopez de Gutier[place], [place] Phone Number: 08/07/2020, 10:17 AM  Clinical Narrative:     CSW spoke with patient's daughter,[name]- CSW provide bed offers. She has selected [facility] sent message to [facility] to confirm availability-CSW waiting on response.  Thurmond Butts, MSW, LCSW Clinical Social Worker   Expected Discharge Plan: Skilled Nursing Facility Barriers to Discharge: Insurance Authorization,Continued Medical Work up,SNF Pending bed offer  Expected Discharge Plan and Services Expected Discharge Plan: [place] In-house Referral: Clinical Social Work     Living arrangements for the past 2 months: Single Family Home                                       Social Determinants of Health (SDOH) Interventions    Readmission Risk Interventions Readmission Risk Prevention Plan 05/30/2019  Transportation Screening Complete  Medication Review [employer] [place]) Complete  PCP or Specialist appointment within 3-5 days of discharge Complete  HRI or [place] Complete  SW Recovery Care/Counseling Consult Complete  [place] Not Applicable  Some recent data might be hidden

## 2020-08-07 NOTE — Progress Notes (Signed)
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  Came to ED with complaints of chest pain, generalized weakness and failure to thrive Patient admitted with sepsis due to UTI.  Found to have abnormal urine.  Started on ceftriaxone.  Urine and blood cultures obtained.   Subjective   Patient seen and examined, denies any complaints.  Eating breakfast in bed.    Assessment/Plan:     1. Severe sepsis due to UTI-patient presented with lactic acid 2.6, hypotension.  Sepsis physiology has resolved.  Patient started on IV ceftriaxone, urine culture growing greater than 100,000 colonies per mL Proteus mirabilis.  Sensitive to ceftriaxone, cefazolin.  IV ceftriaxone was discontinued and patient started on p.o. Keflex.  Will complete 7 days of treatment starting from 01/24/9146. 2. Metabolic encephalopathy-likely due to sepsis from UTI as well as underlying dementia.  Significantly improved.  Likely at baseline.  Continue with  antibiotics as above. 3. Hypothermia-patient presented with hypothermia, likely from underlying severe sepsis as above.  Bair hugger was provided.  Hypothermia has at this time resolved.  TSH 2.793, cortisol 17.1. 4. Mild transaminitis-patient presented with mildly elevated AST and ALT, likely from sepsis as above.  Resolved. 5. Stage III non-small cell lung cancer/small cell carcinoma-followed by oncology as outpatient. 6. Paroxysmal atrial fibrillation-currently in normal sinus rhythm, continue Eliquis. 7. History of pulmonary embolism-continue Eliquis     COVID-19 Labs  No results for input(s): DDIMER,  FERRITIN, LDH, CRP in the last 72 hours.  Lab Results  Component Value Date   SARSCOV2NAA NEGATIVE 08/01/2020   SARSCOV2NAA NEGATIVE 05/14/2020   SARSCOV2NAA NEGATIVE 05/09/2020   SARSCOV2NAA POSITIVE (A) 02/16/2020     Scheduled medications:    apixaban  5 mg Oral BID   cephALEXin  500 mg Oral Q12H         CBG: No results for input(s): GLUCAP in the last 168 hours.  SpO2: 100 %    CBC: Recent Labs  Lab 07/31/20 2040 08/01/20 0600 08/02/20 0011  WBC 4.4 5.2 4.8  HGB 10.8* 10.0* 10.4*  HCT 33.8* 31.5* 32.2*  MCV 61.1* 61.0* 60.3*  PLT 230 205 829    Basic Metabolic Panel: Recent Labs  Lab 07/31/20 2040 08/01/20 0600 08/02/20 0011 08/03/20 0150  NA 141 142 143 137  K 4.6 5.2* 4.2 4.0  CL 106 111 110 107  CO2 24 22 23 22   GLUCOSE 158* 73 82 73  BUN 19 20 20 19   CREATININE 0.64 0.67 1.15 0.78  CALCIUM 9.7 9.5 9.3 9.0     Liver Function Tests: Recent Labs  Lab 07/31/20 2255 08/01/20 0600 08/02/20 0011 08/03/20 0150  AST 80* 68* 55* 40  ALT 64* 57* 51* 42  ALKPHOS 113 100 98 93  BILITOT 0.8 0.7 0.5 0.8  PROT 6.8 5.7* 6.1* 5.8*  ALBUMIN 3.3* 2.9* 2.9* 2.8*     Antibiotics: Anti-infectives (From admission, onward)   Start     Dose/Rate Route Frequency Ordered Stop   08/05/20 0800  cephALEXin (KEFLEX) capsule 500 mg        500 mg Oral Every 12 hours 08/04/20 1204     08/01/20 1000  vancomycin (VANCOREADY) IVPB  1000 mg/200 mL  Status:  Discontinued        1,000 mg 200 mL/hr over 60 Minutes Intravenous Every 12 hours 08/01/20 0238 08/01/20 0516   08/01/20 0600  ceFEPIme (MAXIPIME) 2 g in sodium chloride 0.9 % 100 mL IVPB  Status:  Discontinued        2 g 200 mL/hr over 30 Minutes Intravenous Every 8 hours 08/01/20 0238 08/01/20 0516   08/01/20 0520  cefTRIAXone (ROCEPHIN) 1 g in sodium chloride 0.9 % 100 mL IVPB  Status:  Discontinued        1 g 200 mL/hr over 30 Minutes Intravenous Daily 08/01/20 0516 08/04/20 1204   08/01/20 0100   ceFEPIme (MAXIPIME) 2 g in sodium chloride 0.9 % 100 mL IVPB        2 g 200 mL/hr over 30 Minutes Intravenous  Once 08/01/20 0050 08/01/20 0223   08/01/20 0100  metroNIDAZOLE (FLAGYL) IVPB 500 mg        500 mg 100 mL/hr over 60 Minutes Intravenous  Once 08/01/20 0050 08/01/20 0223   08/01/20 0100  vancomycin (VANCOREADY) IVPB 1000 mg/200 mL        1,000 mg 200 mL/hr over 60 Minutes Intravenous  Once 08/01/20 0050 08/01/20 0324       DVT prophylaxis: Apixaban  Code Status: Full code  Family Communication: Spoke to patient's daughter on phone     Objective   Vitals:   08/06/20 2009 08/06/20 2303 08/07/20 0344 08/07/20 0728  BP: (!) 142/77 136/78 118/74 121/70  Pulse: 97 81 84 79  Resp: 18 20 (!) 22 18  Temp: 97.8 F (36.6 C) 97.7 F (36.5 C) 98.1 F (36.7 C) (!) 97.1 F (36.2 C)  TempSrc: Oral Oral Oral   SpO2: 95% 98% 92% 100%    Intake/Output Summary (Last 24 hours) at 08/07/2020 1301 Last data filed at 08/07/2020 0900 Gross per 24 hour  Intake 1860 ml  Output 975 ml  Net 885 ml    02/20 1901 - 02/22 0700 In: 4119.1 [P.O.:720; I.V.:3399.1] Out: 750 [Urine:750]  There were no vitals filed for this visit.  Physical Examination:  General-appears in no acute distress Heart-S1-S2, regular, no murmur auscultated Lungs-clear to auscultation bilaterally, no wheezing or crackles auscultated Abdomen-soft, nontender, no organomegaly Extremities-no edema in the lower extremities Neuro-alert, oriented x3, no focal deficit noted  Status is: Inpatient  Dispo: The patient is from: Home              Anticipated d/c is [facility] nursing facility              Anticipated d/c date is: 08/08/2020              Patient currently medically stable for discharge  Barrier to discharge-awaiting bed at skilled nursing facility  Pressure Injury 05/19/19 Hip Left;Lower Stage II -  Partial thickness loss of dermis presenting as a shallow open ulcer with a red, pink wound bed  without slough. Stage II present with scab over it (Active)  05/19/19 0348  Location: Hip  Location Orientation: Left;Lower  Staging: Stage II -  Partial thickness loss of dermis presenting as a shallow open ulcer with a red, pink wound bed without slough.  Wound Description (Comments): Stage II present with scab over it  Present on Admission: Yes     Pressure Injury 05/19/19 Hip Right;Left Stage I -  Intact skin with non-blanchable redness of a localized area usually over a bony prominence. (Active)  05/19/19 2549  Location: Hip  Location Orientation: Right;Left  Staging: Stage I -  Intact skin with non-blanchable redness of a localized area usually over a bony prominence.  Wound Description (Comments):   Present on Admission:            Data Reviewed:   Recent Results (from the past 240 hour(s))  Culture, blood (routine x 2)     Status: None   Collection Time: 08/01/20  1:05 AM   Specimen: BLOOD  Result Value Ref Range Status   Specimen Description BLOOD LEFT FOREARM  Final   Special Requests   Final    BOTTLES DRAWN AEROBIC AND ANAEROBIC Blood Culture adequate volume   Culture   Final    NO GROWTH 5 DAYS Performed at [facility], [address] [place], [place] [address]    Report Status 08/06/2020 FINAL  Final  Urine culture     Status: Abnormal   Collection Time: 08/01/20  2:20 AM   Specimen: Urine, Random  Result Value Ref Range Status   Specimen Description URINE, RANDOM  Final   Special Requests   Final    NONE Performed at [facility], [place] [address] [place], [place] [address]    Culture >=100,000 COLONIES/mL PROTEUS MIRABILIS (A)  Final   Report Status 08/03/2020 FINAL  Final   Organism ID, Bacteria PROTEUS MIRABILIS (A)  Final      Susceptibility   Proteus mirabilis - MIC*    AMPICILLIN <=2 SENSITIVE Sensitive     CEFAZOLIN <=4 SENSITIVE Sensitive     CEFEPIME <=0.12 SENSITIVE Sensitive     CEFTRIAXONE <=0.25 SENSITIVE Sensitive      CIPROFLOXACIN <=0.25 SENSITIVE Sensitive     GENTAMICIN <=1 SENSITIVE Sensitive     IMIPENEM 2 SENSITIVE Sensitive     NITROFURANTOIN 128 RESISTANT Resistant     TRIMETH/SULFA <=20 SENSITIVE Sensitive     AMPICILLIN/SULBACTAM <=2 SENSITIVE Sensitive     PIP/TAZO <=4 SENSITIVE Sensitive     * >=100,000 COLONIES/mL PROTEUS MIRABILIS  Resp Panel by RT-PCR (Flu A&B, Covid) Nasopharyngeal Swab     Status: None   Collection Time: 08/01/20  2:44 AM   Specimen: Nasopharyngeal Swab; Nasopharyngeal(NP) swabs in vial transport medium  Result Value Ref Range Status   SARS Coronavirus 2 by RT PCR NEGATIVE NEGATIVE Final    Comment: (NOTE) SARS-CoV-2 target nucleic acids are NOT DETECTED.  The SARS-CoV-2 RNA is generally detectable in upper respiratory specimens during the acute phase of infection. The lowest concentration of SARS-CoV-2 viral copies this assay can detect is 138 copies/mL. A negative result does not preclude SARS-Cov-2 infection and should not be used as the sole basis for treatment or other patient management decisions. A negative result may occur with  improper specimen collection/handling, submission of specimen other than nasopharyngeal swab, presence of viral mutation(s) within the areas targeted by this assay, and inadequate number of viral copies(<138 copies/mL). A negative result must be combined with clinical observations, patient history, and epidemiological information. The expected result is Negative.  Fact Sheet for Patients:  EntrepreneurPulse.com.au  Fact Sheet for Healthcare Providers:  IncredibleEmployment.be  This test is no t yet approved or cleared by the [place] FDA and  has been authorized for detection and/or diagnosis of SARS-CoV-2 by FDA under an Emergency Use Authorization (EUA). This EUA will remain  in effect (meaning this test can be used) for the duration of the COVID-19 declaration under Section 564(b)(1)  of the Act, 21 U.S.C.section 360bbb-3(b)(1),  unless the authorization is terminated  or revoked sooner.       Influenza A by PCR NEGATIVE NEGATIVE Final   Influenza B by PCR NEGATIVE NEGATIVE Final    Comment: (NOTE) The Xpert Xpress SARS-CoV-2/FLU/RSV plus assay is intended as an aid in the diagnosis of influenza from Nasopharyngeal swab specimens and should not be used as a sole basis for treatment. Nasal washings and aspirates are unacceptable for Xpert Xpress SARS-CoV-2/FLU/RSV testing.  Fact Sheet for Patients: EntrepreneurPulse.com.au  Fact Sheet for Healthcare Providers: IncredibleEmployment.be  This test is not yet approved or cleared by the [place] FDA and has been authorized for detection and/or diagnosis of SARS-CoV-2 by FDA under an Emergency Use Authorization (EUA). This EUA will remain in effect (meaning this test can be used) for the duration of the COVID-19 declaration under Section 564(b)(1) of the Act, 21 U.S.C. section 360bbb-3(b)(1), unless the authorization is terminated or revoked.  Performed at [facility], [place] [address] [place], [place] [address]   Culture, blood (Routine X 2) w Reflex to ID Panel     Status: None   Collection Time: 08/02/20 12:11 AM   Specimen: BLOOD RIGHT HAND  Result Value Ref Range Status   Specimen Description BLOOD RIGHT HAND  Final   Special Requests   Final    BOTTLES DRAWN AEROBIC ONLY Blood Culture adequate volume   Culture   Final    NO GROWTH 5 DAYS Performed at [facility], [place] [address] [place], [place] [address]    Report Status 08/07/2020 FINAL  Final    No results for input(s): LIPASE, AMYLASE in the last 168 hours. Recent Labs  Lab 08/01/20 0600  AMMONIA 57*    Cardiac Enzymes: No results for input(s): CKTOTAL, CKMB, CKMBINDEX, TROPONINI in the last 168 hours. BNP (last 3 results) Recent Labs    01/31/20 2100  BNP 43.7    ProBNP (last 3  results) No results for input(s): PROBNP in the last 8760 hours.  Studies:  No results found.     Oswald Hillock   [facility] If 7PM-7AM, please contact night-coverage at [email or web address], Office  [phone]   08/07/2020, 1:01 PM  LOS: 6 days

## 2020-08-07 NOTE — Progress Notes (Signed)
PT Cancellation Note  Patient Details Name: Richard [name] MRN: [id number] DOB: 04-09-1941   Cancelled Treatment:    Reason Eval/Treat Not Completed: Medical issues which prohibited therapy  Spoke with RN who reports pt just had 2 runs of Vtach and request to hold PT.  Will f/u at later date. [name], PT Acute Rehab Services Pager [phone] [facility] [place] 08/07/2020, 3:36 PM

## 2020-08-08 NOTE — TOC Progression Note (Signed)
Transition of Care [facility]) - Progression Note    Patient Details  Name: Richard [name] MRN: [id number] Date of Birth: May 14, 1941  [facility] CM/SW Contact  Joanne Chars, LCSW Phone Number: 08/08/2020, 1:07 PM  Clinical Narrative:  CSW informed pt ready for discharge.  CSW contacted [employer] regarding auth and was informed that they have not received clinicals.  Auth request faxed.     Expected Discharge Plan: Skilled Nursing Facility Barriers to Discharge: [place] Work up,SNF Pending bed offer  Expected Discharge Plan and Services Expected Discharge Plan: [facility] In-house Referral: Clinical Social Work     Living arrangements for the past 2 months: Single Family Home Expected Discharge Date: 08/08/20                                     Social Determinants of Health (SDOH) Interventions    Readmission Risk Interventions Readmission Risk Prevention Plan 05/30/2019  Transportation Screening Complete  Medication Review Press photographer) Complete  PCP or Specialist appointment within 3-5 days of discharge Complete  HRI or [place] Complete  SW Recovery Care/Counseling Consult Complete  [place] Not Applicable  Some recent data might be hidden

## 2020-08-08 NOTE — Progress Notes (Signed)
Physical Therapy Treatment Patient Details Name: Richard [name] MRN: [id number] DOB: 1940-06-17 Today's Date: 08/08/2020    History of Present Illness The pt is a 80 yo male presenting with generalized weakness and failure to thrive. Upon workup, pt found to have sepsis due to UTI. 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    PT Comments    Pt mildly irritable on arrival.  Emphasis on ROM to LE's, transitions, sitting balance, transfer to the chair, prolonged extension ROM to Bil knees.    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    Transfers Overall transfer level: Needs assistance   Transfers: Sit to/from [employer] Transfers Sit to Stand: Total assist   Squat pivot transfers: Max assist;Total assist     General transfer comment: submaximal stand due to contracture.  Little to no pt assist.  Left on maximove lift pad.  Ambulation/Gait                 Stairs             Wheelchair Mobility    Modified Rankin (Stroke Patients Only)       Balance Overall balance assessment: Needs assistance   Sitting balance-Leahy Scale: Fair         Standing balance comment: unable to achieve a "real" stand, but can be rocked forward and boosted into w/bearing with hips off surface.                            Cognition Arousal/Alertness: Awake/alert Behavior During Therapy: WFL  for tasks assessed/performed Overall Cognitive Status: History of cognitive impairments - at baseline                                        Exercises Other Exercises Other Exercises: gentle prolonged knee ext stretch and ROM for warm up and distraction.    General Comments General comments (skin integrity, edema, etc.): vss      Pertinent Vitals/Pain Pain Assessment: Faces Faces Pain Scale: Hurts little more Pain Location: bilateral knees with attempt to straighten Pain Descriptors / Indicators: Discomfort Pain Intervention(s): Monitored during session;Limited activity within patient's tolerance    Home Living                      Prior Function            PT Goals (current goals can now be found in the care plan section) Acute Rehab PT Goals Patient Stated Goal: "I am ready to go home" PT Goal Formulation: With patient Time For Goal Achievement: 08/17/20 Potential to Achieve Goals: Fair Progress towards PT goals: Progressing toward goals (minimal progress)    Frequency    Min 2X/week      PT Plan Current plan remains appropriate  Co-evaluation              AM-PAC PT "6 Clicks" Mobility   Outcome Measure  Help needed turning from your back to your side while in a flat bed without using bedrails?: A Lot Help needed moving from lying on your back to sitting on the side of a flat bed without using bedrails?: A Lot Help needed moving to and from a bed to a chair (including a wheelchair)?: Total Help needed standing up from a chair using your arms (e.g., wheelchair or bedside chair)?: Total Help needed to walk in hospital room?: Total Help needed climbing 3-5 steps with a railing? : Total 6 Click Score: 8    End of Session Equipment Utilized During Treatment: Gait belt Activity Tolerance: Other (comment) (mild agitation) Patient left: in chair;with call bell/phone within reach;with chair alarm set;Other (comment) (maximove lift  pad.) Nurse Communication: Mobility status PT Visit Diagnosis: Muscle weakness (generalized) (M62.81);Other abnormalities of gait and mobility (R26.89)     Time: 4665-9935 PT Time Calculation (min) (ACUTE ONLY): 14 min  Charges:  $Therapeutic Activity: 8-22 mins                     08/08/2020  Richard Carne., PT Acute Rehabilitation Services [phone]  (pager) [phone]  (office)   Richard [name] 08/08/2020, 5:55 PM

## 2020-08-08 NOTE — Discharge Summary (Signed)
Discharge Summary  Richard [name] ZOX:[id number] DOB: 1940-07-21  PCP: [name], NP  Admit date: 07/31/2020 Discharge date: 08/08/2020  Time spent: 35 minutes.  Recommendations for Outpatient Follow-up:  1. Follow-up with your PCP in 1 to 2 weeks. 2. Take your medications as prescribed. 3. Continue PT OT with assistance and fall precautions.  Discharge Diagnoses:  Active Hospital Problems   Diagnosis Date Noted  . UTI (urinary tract infection) 05/09/2020  . Acute metabolic encephalopathy 81/19/1478  . Chest pain 08/01/2020  . Hypothermia 05/09/2020  . Sepsis ([place]) 05/08/2019    Resolved Hospital Problems  No resolved problems to display.    Discharge Condition: Stable.  Diet recommendation: Resume previous diet.  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 Came to ED with complaints of chest pain, generalized weakness and failure to thrive. Work-up revealed sepsis due to UTI.  Treated with Rocephin, completed course of antibiotics. Found to have abnormal urine. Started on ceftriaxone. Urine and blood cultures obtained.  Blood cultures negative to date, final.  Seen by PT OT with recommendation for SNF.  08/08/20: Seen and examined at his bedside.  There were no acute events overnight.  He has no new complaints.  Awaiting SNF placement.  Hospital Course:  Principal Problem:   UTI (urinary tract infection) Active Problems:   Sepsis ([place])   Hypothermia   Acute metabolic encephalopathy   Chest pain  1. Severe sepsis due to Proteus mirabilis UTI-patient  presented with tachycardia, tachypnea, UA positive for pyuria.  Sepsis physiology has resolved.  Patient received IV ceftriaxone.  Urine culture grew greater than 100,000 colonies per mL Proteus mirabilis.  Sensitive to ceftriaxone, cefazolin.  IV ceftriaxone was discontinued and patient completed course of antibiotics on p.o. Keflex.  2. Resolved toxic metabolic encephalopathy-likely due to sepsis from UTI as well as underlying dementia.   Appears to be back to his baseline mentation.  Alert, calm, and pleasant. 3. Resolved hypothermia-patient presented with hypothermia, likely from underlying severe sepsis as above.  Bair hugger was provided.  TSH 2.793, cortisol 17.1. 4. Resolved mild transaminitis-patient presented with mildly elevated AST and ALT, likely from sepsis as above.  5. Stage III non-small cell lung cancer/small cell carcinoma-followed by oncology as outpatient. 6. Paroxysmal atrial fibrillation-currently in normal sinus rhythm, continue Eliquis. 7. History of pulmonary embolism-continue Eliquis 8. Ambulatory dysfunction-assessed by PT OT with recommendation for SNF.     Procedures:  None.  Consultations:  None.  Discharge Exam: BP 130/72   Pulse 87   Temp 97.9 F (36.6 C) (Oral)   Resp 18   SpO2 95%  . General: 80 y.o. year-old male well developed well nourished in no acute distress.  Alert and pleasant. . Cardiovascular: Regular rate and rhythm with no rubs or gallops.  No thyromegaly or JVD noted.   Marland Kitchen Respiratory: Clear to auscultation with no wheezes or rales. Good inspiratory effort. . Abdomen: Soft nontender nondistended with normal bowel sounds. . Musculoskeletal: No lower extremity edema bilaterally. Marland Kitchen Psychiatry: Mood is appropriate for condition and setting  Discharge Instructions You were cared  for by a hospitalist during your hospital stay. If you have any questions about your discharge medications or the care you received while you were in the hospital  after you are discharged, you can call the unit and asked to speak with the hospitalist on call if the hospitalist that took care of you is not available. Once you are discharged, your primary care physician will handle any further medical issues. Please note that NO REFILLS for any discharge medications will be authorized once you are discharged, as it is imperative that you return to your primary care physician (or establish a relationship with a primary care physician if you do not have one) for your aftercare needs so that they can reassess your need for medications and monitor your lab values.   Allergies as of 08/08/2020   No Known Allergies     Medication List    TAKE these medications   acetaminophen 325 MG tablet Commonly known as: TYLENOL Take 650 mg by mouth every 6 (six) hours as needed for mild pain.   apixaban 5 MG Tabs tablet Commonly known as: Eliquis Take 1 tablet (5 mg total) by mouth 2 (two) times daily.   feeding supplement Liqd Take 237 mLs by mouth 3 (three) times daily between meals. What changed: when to take this   guaiFENesin 600 MG 12 hr tablet Commonly known as: MUCINEX Take 600 mg by mouth 2 (two) times daily as needed for cough or to loosen phlegm.   multivitamin with minerals Tabs tablet Take 1 tablet by mouth daily.   tamsulosin 0.4 MG Caps capsule Commonly known as: FLOMAX Take 1 capsule (0.4 mg total) by mouth daily after supper.      No Known Allergies  Follow-up Information    [name], NP. Call in 1 day(s).   Specialty: Internal Medicine Why: Please call for a post hospital follow-up appointment. Contact information: [address] [address] [phone]        Jerline Pain, MD .   Specialty: Cardiology Contact information: [id number] [place] [address] [phone]                The results of significant diagnostics from this hospitalization (including imaging,  microbiology, ancillary and laboratory) are listed below for reference.    Significant Diagnostic Studies: DG Chest 2 View  Result Date: 07/31/2020 CLINICAL DATA:  Chest pain and failure to thrive. EXAM: CHEST - 2 VIEW COMPARISON:  May 09, 2020 FINDINGS: Stable decreased lung volumes are seen on the right. Stable moderate severity right basilar scarring and/or atelectasis is seen. A trace amount of left basilar atelectasis and/or scarring is also noted. There is no evidence of a pleural effusion or pneumothorax. The heart size and mediastinal contours are within normal limits. The visualized skeletal structures are unremarkable. IMPRESSION: 1. Stable, likely post treatment related right basilar scarring and/or atelectasis. Electronically Signed   By: Virgina Norfolk M.D.   On: 07/31/2020 20:58   CT Head Wo Contrast  Result Date: 07/31/2020 CLINICAL DATA:  Encephalopathy EXAM: CT HEAD WITHOUT CONTRAST TECHNIQUE: Contiguous axial images were obtained from the base of the skull through the vertex without intravenous contrast. COMPARISON:  05/09/2020 FINDINGS: Brain: Unchanged advanced ventriculomegaly. No acute hemorrhage. No midline shift or other mass effect. Vascular: Atherosclerotic calcification of the internal carotid arteries at the skull base. No abnormal hyperdensity of the major intracranial arteries or dural venous sinuses. Skull: The visualized skull base, calvarium and  extracranial soft tissues are normal. Sinuses/Orbits: No fluid levels or advanced mucosal thickening of the visualized paranasal sinuses. No mastoid or middle ear effusion. The orbits are normal. IMPRESSION: 1. No acute intracranial abnormality. 2. Unchanged advanced ventriculomegaly. Electronically Signed   By: Ulyses Jarred M.D.   On: 07/31/2020 22:42    Microbiology: Recent Results (from the past 240 hour(s))  Culture, blood (routine x 2)     Status: None   Collection Time: 08/01/20  1:05 AM   Specimen: BLOOD   Result Value Ref Range Status   Specimen Description BLOOD LEFT FOREARM  Final   Special Requests   Final    BOTTLES DRAWN AEROBIC AND ANAEROBIC Blood Culture adequate volume   Culture   Final    NO GROWTH 5 DAYS Performed at [facility], [address], [place], [place] [address]    Report Status 08/06/2020 FINAL  Final  Urine culture     Status: Abnormal   Collection Time: 08/01/20  2:20 AM   Specimen: Urine, Random  Result Value Ref Range Status   Specimen Description URINE, RANDOM  Final   Special Requests   Final    NONE Performed at [facility], [place] [address] [place],  [address]    Culture >=100,000 COLONIES/mL PROTEUS MIRABILIS (A)  Final   Report Status 08/03/2020 FINAL  Final   Organism ID, Bacteria PROTEUS MIRABILIS (A)  Final      Susceptibility   Proteus mirabilis - MIC*    AMPICILLIN <=2 SENSITIVE Sensitive     CEFAZOLIN <=4 SENSITIVE Sensitive     CEFEPIME <=0.12 SENSITIVE Sensitive     CEFTRIAXONE <=0.25 SENSITIVE Sensitive     CIPROFLOXACIN <=0.25 SENSITIVE Sensitive     GENTAMICIN <=1 SENSITIVE Sensitive     IMIPENEM 2 SENSITIVE Sensitive     NITROFURANTOIN 128 RESISTANT Resistant     TRIMETH/SULFA <=20 SENSITIVE Sensitive     AMPICILLIN/SULBACTAM <=2 SENSITIVE Sensitive     PIP/TAZO <=4 SENSITIVE Sensitive     * >=100,000 COLONIES/mL PROTEUS MIRABILIS  Resp Panel by RT-PCR (Flu A&B, Covid) Nasopharyngeal Swab     Status: None   Collection Time: 08/01/20  2:44 AM   Specimen: Nasopharyngeal Swab; Nasopharyngeal(NP) swabs in vial transport medium  Result Value Ref Range Status   SARS Coronavirus 2 by RT PCR NEGATIVE NEGATIVE Final    Comment: (NOTE) SARS-CoV-2 target nucleic acids are NOT DETECTED.  The SARS-CoV-2 RNA is generally detectable in upper respiratory specimens during the acute phase of infection. The lowest concentration of SARS-CoV-2 viral copies this assay can detect is 138 copies/mL. A negative result does not  preclude SARS-Cov-2 infection and should not be used as the sole basis for treatment or other patient management decisions. A negative result may occur with  improper specimen collection/handling, submission of specimen other than nasopharyngeal swab, presence of viral mutation(s) within the areas targeted by this assay, and inadequate number of viral copies(<138 copies/mL). A negative result must be combined with clinical observations, patient history, and epidemiological information. The expected result is Negative.  Fact Sheet for Patients:  EntrepreneurPulse.com.au  Fact Sheet for Healthcare Providers:  IncredibleEmployment.be  This test is no t yet approved or cleared by the [place] FDA and  has been authorized for detection and/or diagnosis of SARS-CoV-2 by FDA under an Emergency Use Authorization (EUA). This EUA will remain  in effect (meaning this test can be used) for the duration of the COVID-19 declaration under Section 564(b)(1)  of the Act, 21 U.S.C.section 360bbb-3(b)(1), unless the authorization is terminated  or revoked sooner.       Influenza A by PCR NEGATIVE NEGATIVE Final   Influenza B by PCR NEGATIVE NEGATIVE Final    Comment: (NOTE) The Xpert Xpress SARS-CoV-2/FLU/RSV plus assay is intended as an aid in the diagnosis of influenza from Nasopharyngeal swab specimens and should not be used as a sole basis for treatment. Nasal washings and aspirates are unacceptable for Xpert Xpress SARS-CoV-2/FLU/RSV testing.  Fact Sheet for Patients: EntrepreneurPulse.com.au  Fact Sheet for Healthcare Providers: IncredibleEmployment.be  This test is not yet approved or cleared by the [place] FDA and has been authorized for detection and/or diagnosis of SARS-CoV-2 by FDA under an Emergency Use Authorization (EUA). This EUA will remain in effect (meaning this test can be used) for the  duration of the COVID-19 declaration under Section 564(b)(1) of the Act, 21 U.S.C. section 360bbb-3(b)(1), unless the authorization is terminated or revoked.  Performed at [facility], [place] [address], [place], [place] [address]   Culture, blood (Routine X 2) w Reflex to ID Panel     Status: None   Collection Time: 08/02/20 12:11 AM   Specimen: BLOOD RIGHT HAND  Result Value Ref Range Status   Specimen Description BLOOD RIGHT HAND  Final   Special Requests   Final    BOTTLES DRAWN AEROBIC ONLY Blood Culture adequate volume   Culture   Final    NO GROWTH 5 DAYS Performed at [facility], [place] [address], [place], [place] [address]    Report Status 08/07/2020 FINAL  Final  SARS CORONAVIRUS 2 (TAT 6-24 HRS) Nasopharyngeal Nasopharyngeal Swab     Status: None   Collection Time: 08/07/20 11:22 AM   Specimen: Nasopharyngeal Swab  Result Value Ref Range Status   SARS Coronavirus 2 NEGATIVE NEGATIVE Final    Comment: (NOTE) SARS-CoV-2 target nucleic acids are NOT DETECTED.  The SARS-CoV-2 RNA is generally detectable in upper and lower respiratory specimens during the acute phase of infection. Negative results do not preclude SARS-CoV-2 infection, do not rule out co-infections with other pathogens, and should not be used as the sole basis for treatment or other patient management decisions. Negative results must be combined with clinical observations, patient history, and epidemiological information. The expected result is Negative.  Fact Sheet for Patients: SugarRoll.be  Fact Sheet for Healthcare Providers: https://www.woods-mathews.com/  This test is not yet approved or cleared by the [place] FDA and  has been authorized for detection and/or diagnosis of SARS-CoV-2 by FDA under an Emergency Use Authorization (EUA). This EUA will remain  in effect (meaning this test can be used) for the duration of the COVID-19  declaration under Se ction 564(b)(1) of the Act, 21 U.S.C. section 360bbb-3(b)(1), unless the authorization is terminated or revoked sooner.  Performed at [facility], [place] [address] [place],  [address]      Labs: Basic Metabolic Panel: Recent Labs  Lab 08/02/20 0011 08/03/20 0150 08/07/20 1514  NA 143 137 140  K 4.2 4.0 4.0  CL 110 107 109  CO2 23 22 21*  GLUCOSE 82 73 79  BUN 20 19 8   CREATININE 1.15 0.78 0.78  CALCIUM 9.3 9.0 9.0  MG  --   --  1.6*   Liver Function Tests: Recent Labs  Lab 08/02/20 0011 08/03/20 0150  AST 55* 40  ALT 51* 42  ALKPHOS 98 93  BILITOT 0.5 0.8  PROT  6.1* 5.8*  ALBUMIN 2.9* 2.8*   No results for input(s): LIPASE, AMYLASE in the last 168 hours. No results for input(s): AMMONIA in the last 168 hours. CBC: Recent Labs  Lab 08/02/20 0011  WBC 4.8  HGB 10.4*  HCT 32.2*  MCV 60.3*  PLT 182   Cardiac Enzymes: No results for input(s): CKTOTAL, CKMB, CKMBINDEX, TROPONINI in the last 168 hours. BNP: BNP (last 3 results) Recent Labs    01/31/20 2100  BNP 43.7    ProBNP (last 3 results) No results for input(s): PROBNP in the last 8760 hours.  CBG: No results for input(s): GLUCAP in the last 168 hours.     Signed:  Kayleen Memos, MD [facility] 08/08/2020, 10:58 AM

## 2020-08-09 MED ORDER — ADULT MULTIVITAMIN W/MINERALS CH
1.0000 | ORAL_TABLET | Freq: Every day | ORAL | Status: DC
  Administered 2020-08-09: 1 via ORAL
  Filled 2020-08-09: qty 1

## 2020-08-09 MED ORDER — TAMSULOSIN HCL 0.4 MG PO CAPS
0.4000 mg | ORAL_CAPSULE | Freq: Every day | ORAL | Status: DC
Start: 2020-08-09 — End: 2020-08-09

## 2020-08-09 NOTE — TOC Progression Note (Signed)
Transition of Care [facility]) - Progression Note    Patient Details  Name: Richard [name] MRN: [id number] Date of Birth: Jan 02, 1941  Transition of Care [facility]) CM/SW [place], [place] Phone Number: 08/09/2020, 11:01 AM  Clinical Narrative:     [facility] confirmed bed offer and availability today- Insurance authorization received reference # [id number] from 2/23-2/25   Thurmond Butts, MSW, LCSW Clinical Social Worker   Expected Discharge Plan: [place] Barriers to Discharge: Insurance Authorization,Continued Medical Work up,SNF Pending bed offer  Expected Discharge Plan and Services Expected Discharge Plan: [place] In-house Referral: Clinical Social Work     Living arrangements for the past 2 months: Single Family Home Expected Discharge Date: 08/08/20                                     Social Determinants of Health (SDOH) Interventions    Readmission Risk Interventions Readmission Risk Prevention Plan 05/30/2019  Transportation Screening Complete  Medication Review Press photographer) Complete  PCP or Specialist appointment within 3-5 days of discharge Complete  HRI or [place] Complete  SW Recovery Care/Counseling Consult Complete  [place] Not Applicable  Some recent data might be hidden

## 2020-08-09 NOTE — Discharge Summary (Signed)
Discharge Summary  Coalton Arch DJM:[id number] DOB: 1940/06/17  PCP: [name], NP  Admit date: 07/31/2020 Discharge date: 08/09/2020  Time spent: 35 minutes.  Recommendations for Outpatient Follow-up:  1. Follow-up with your PCP in 1 to 2 weeks. 2. Take your medications as prescribed. 3. Continue PT OT with assistance and fall precautions.  Discharge Diagnoses:  Active Hospital Problems   Diagnosis Date Noted  . UTI (urinary tract infection) 05/09/2020  . Acute metabolic encephalopathy 22/29/7989  . Chest pain 08/01/2020  . Hypothermia 05/09/2020  . Sepsis ([place]) 05/08/2019    Resolved Hospital Problems  No resolved problems to display.    Discharge Condition: Stable.  Diet recommendation: Resume previous diet.  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 Came to ED with complaints of chest pain, generalized weakness and failure to thrive. Work-up revealed sepsis due to UTI.  Treated with Rocephin, completed course of antibiotics. Found to have abnormal urine. Started on ceftriaxone. Urine and blood cultures obtained.  Blood cultures negative to date, final.  Seen by PT OT with recommendation for SNF.  08/09/20: Seen at his bedside.  He has no new complaint.  Awaiting SNF placement.  Will transfer to [facility] unit.  Hospital Course:  Principal Problem:   UTI (urinary tract infection) Active Problems:   Sepsis ([place])   Hypothermia   Acute metabolic encephalopathy   Chest pain  1. Severe sepsis due to Proteus mirabilis  UTI-patient presented with tachycardia, tachypnea, UA positive for pyuria.  Sepsis physiology has resolved.  Patient received IV ceftriaxone.  Urine culture grew greater than 100,000 colonies per mL Proteus mirabilis.  Sensitive to ceftriaxone, cefazolin.  IV ceftriaxone was discontinued and patient completed course of antibiotics on p.o. Keflex.  2. Resolved toxic metabolic encephalopathy-likely due to sepsis from UTI as well as underlying dementia.   Appears to be back to his baseline mentation.  Alert, calm, and pleasant. 3. Resolved hypothermia-patient presented with hypothermia, likely from underlying severe sepsis as above.  Bair hugger was provided.  TSH 2.793, cortisol 17.1. 4. Resolved mild transaminitis-patient presented with mildly elevated AST and ALT, likely from sepsis as above.  5. Stage III non-small cell lung cancer/small cell carcinoma-followed by oncology as outpatient. 6. Paroxysmal atrial fibrillation-currently in normal sinus rhythm, continue Eliquis. 7. History of pulmonary embolism-continue Eliquis 8. Ambulatory dysfunction-assessed by PT OT with recommendation for SNF.     Procedures:  None.  Consultations:  None.  Discharge Exam: No significant changes from prior exam. BP (!) 100/59 (BP Location: Right Arm)   Pulse 87   Temp 97.8 F (36.6 C) (Oral)   Resp 20   SpO2 94%  . General: 80 y.o. year-old male well developed well nourished in no acute distress.  Alert and pleasant. . Cardiovascular: Regular rate and rhythm with no rubs or gallops.  No thyromegaly or JVD noted.   Marland Kitchen Respiratory: Clear to auscultation with no wheezes or rales. Good inspiratory effort. . Abdomen: Soft nontender nondistended with normal bowel sounds. . Musculoskeletal: No lower extremity edema bilaterally. Marland Kitchen Psychiatry: Mood  is appropriate for condition and setting  Discharge Instructions You were cared for by a hospitalist during your hospital stay. If you have any questions about  your discharge medications or the care you received while you were in the hospital after you are discharged, you can call the unit and asked to speak with the hospitalist on call if the hospitalist that took care of you is not available. Once you are discharged, your primary care physician will handle any further medical issues. Please note that NO REFILLS for any discharge medications will be authorized once you are discharged, as it is imperative that you return to your primary care physician (or establish a relationship with a primary care physician if you do not have one) for your aftercare needs so that they can reassess your need for medications and monitor your lab values.   Allergies as of 08/09/2020   No Known Allergies     Medication List    TAKE these medications   acetaminophen 325 MG tablet Commonly known as: TYLENOL Take 650 mg by mouth every 6 (six) hours as needed for mild pain.   apixaban 5 MG Tabs tablet Commonly known as: Eliquis Take 1 tablet (5 mg total) by mouth 2 (two) times daily.   feeding supplement Liqd Take 237 mLs by mouth 3 (three) times daily between meals. What changed: when to take this   guaiFENesin 600 MG 12 hr tablet Commonly known as: MUCINEX Take 600 mg by mouth 2 (two) times daily as needed for cough or to loosen phlegm.   multivitamin with minerals Tabs tablet Take 1 tablet by mouth daily.   tamsulosin 0.4 MG Caps capsule Commonly known as: FLOMAX Take 1 capsule (0.4 mg total) by mouth daily after supper.      No Known Allergies  Follow-up Information    [name], NP. Call in 1 day(s).   Specialty: Internal Medicine Why: Please call for a post hospital follow-up appointment. Contact information: [address] [place] [address] [phone]        Jerline Pain, MD .   Specialty: Cardiology Contact information: [phone] [address] [place] [address] [phone]                The  results of significant diagnostics from this hospitalization (including imaging, microbiology, ancillary and laboratory) are listed below for reference.    Significant Diagnostic Studies: DG Chest 2 View  Result Date: 07/31/2020 CLINICAL DATA:  Chest pain and failure to thrive. EXAM: CHEST - 2 VIEW COMPARISON:  May 09, 2020 FINDINGS: Stable decreased lung volumes are seen on the right. Stable moderate severity right basilar scarring and/or atelectasis is seen. A trace amount of left basilar atelectasis and/or scarring is also noted. There is no evidence of a pleural effusion or pneumothorax. The heart size and mediastinal contours are within normal limits. The visualized skeletal structures are unremarkable. IMPRESSION: 1. Stable, likely post treatment related right basilar scarring and/or atelectasis. Electronically Signed   By: Virgina Norfolk M.D.   On: 07/31/2020 20:58   CT Head Wo Contrast  Result Date: 07/31/2020 CLINICAL DATA:  Encephalopathy EXAM: CT HEAD WITHOUT CONTRAST TECHNIQUE: Contiguous axial images were obtained from the base of the skull through the vertex without intravenous contrast. COMPARISON:  05/09/2020 FINDINGS: Brain: Unchanged advanced ventriculomegaly. No acute hemorrhage. No midline shift or other mass effect. Vascular: Atherosclerotic calcification of the internal carotid arteries at the skull base. No abnormal hyperdensity of the major intracranial  arteries or dural venous sinuses. Skull: The visualized skull base, calvarium and extracranial soft tissues are normal. Sinuses/Orbits: No fluid levels or advanced mucosal thickening of the visualized paranasal sinuses. No mastoid or middle ear effusion. The orbits are normal. IMPRESSION: 1. No acute intracranial abnormality. 2. Unchanged advanced ventriculomegaly. Electronically Signed   By: Ulyses Jarred M.D.   On: 07/31/2020 22:42    Microbiology: Recent Results (from the past 240 hour(s))  Culture, blood (routine x 2)      Status: None   Collection Time: 08/01/20  1:05 AM   Specimen: BLOOD  Result Value Ref Range Status   Specimen Description BLOOD LEFT FOREARM  Final   Special Requests   Final    BOTTLES DRAWN AEROBIC AND ANAEROBIC Blood Culture adequate volume   Culture   Final    NO GROWTH 5 DAYS Performed at [facility], [address], [place], [place] [address]    Report Status 08/06/2020 FINAL  Final  Urine culture     Status: Abnormal   Collection Time: 08/01/20  2:20 AM   Specimen: Urine, Random  Result Value Ref Range Status   Specimen Description URINE, RANDOM  Final   Special Requests   Final    NONE Performed at [facility], [place] [address], [place], [place] [address]    Culture >=100,000 COLONIES/mL PROTEUS MIRABILIS (A)  Final   Report Status 08/03/2020 FINAL  Final   Organism ID, Bacteria PROTEUS MIRABILIS (A)  Final      Susceptibility   Proteus mirabilis - MIC*    AMPICILLIN <=2 SENSITIVE Sensitive     CEFAZOLIN <=4 SENSITIVE Sensitive     CEFEPIME <=0.12 SENSITIVE Sensitive     CEFTRIAXONE <=0.25 SENSITIVE Sensitive     CIPROFLOXACIN <=0.25 SENSITIVE Sensitive     GENTAMICIN <=1 SENSITIVE Sensitive     IMIPENEM 2 SENSITIVE Sensitive     NITROFURANTOIN 128 RESISTANT Resistant     TRIMETH/SULFA <=20 SENSITIVE Sensitive     AMPICILLIN/SULBACTAM <=2 SENSITIVE Sensitive     PIP/TAZO <=4 SENSITIVE Sensitive     * >=100,000 COLONIES/mL PROTEUS MIRABILIS  Resp Panel by RT-PCR (Flu A&B, Covid) Nasopharyngeal Swab     Status: None   Collection Time: 08/01/20  2:44 AM   Specimen: Nasopharyngeal Swab; Nasopharyngeal(NP) swabs in vial transport medium  Result Value Ref Range Status   SARS Coronavirus 2 by RT PCR NEGATIVE NEGATIVE Final    Comment: (NOTE) SARS-CoV-2 target nucleic acids are NOT DETECTED.  The SARS-CoV-2 RNA is generally detectable in upper respiratory specimens during the acute phase of infection. The lowest concentration of SARS-CoV-2 viral copies  this assay can detect is 138 copies/mL. A negative result does not preclude SARS-Cov-2 infection and should not be used as the sole basis for treatment or other patient management decisions. A negative result may occur with  improper specimen collection/handling, submission of specimen other than nasopharyngeal swab, presence of viral mutation(s) within the areas targeted by this assay, and inadequate number of viral copies(<138 copies/mL). A negative result must be combined with clinical observations, patient history, and epidemiological information. The expected result is Negative.  Fact Sheet for Patients:  EntrepreneurPulse.com.au  Fact Sheet for Healthcare Providers:  IncredibleEmployment.be  This test is no t yet approved or cleared by the [place] FDA and  has been authorized for detection and/or diagnosis of SARS-CoV-2 by FDA under an Emergency Use Authorization (EUA). This EUA will remain  in effect (meaning this test can  be used) for the duration of the COVID-19 declaration under Section 564(b)(1) of the Act, 21 U.S.C.section 360bbb-3(b)(1), unless the authorization is terminated  or revoked sooner.       Influenza A by PCR NEGATIVE NEGATIVE Final   Influenza B by PCR NEGATIVE NEGATIVE Final    Comment: (NOTE) The Xpert Xpress SARS-CoV-2/FLU/RSV plus assay is intended as an aid in the diagnosis of influenza from Nasopharyngeal swab specimens and should not be used as a sole basis for treatment. Nasal washings and aspirates are unacceptable for Xpert Xpress SARS-CoV-2/FLU/RSV testing.  Fact Sheet for Patients: EntrepreneurPulse.com.au  Fact Sheet for Healthcare Providers: IncredibleEmployment.be  This test is not yet approved or cleared by the [place] FDA and has been authorized for detection and/or diagnosis of SARS-CoV-2 by FDA under an Emergency Use Authorization (EUA). This EUA  will remain in effect (meaning this test can be used) for the duration of the COVID-19 declaration under Section 564(b)(1) of the Act, 21 U.S.C. section 360bbb-3(b)(1), unless the authorization is terminated or revoked.  Performed at [facility], [place] [address] [place], [place] [address]   Culture, blood (Routine X 2) w Reflex to ID Panel     Status: None   Collection Time: 08/02/20 12:11 AM   Specimen: BLOOD RIGHT HAND  Result Value Ref Range Status   Specimen Description BLOOD RIGHT HAND  Final   Special Requests   Final    BOTTLES DRAWN AEROBIC ONLY Blood Culture adequate volume   Culture   Final    NO GROWTH 5 DAYS Performed at [facility], [place] [address] [place], [place] [address]    Report Status 08/07/2020 FINAL  Final  SARS CORONAVIRUS 2 (TAT 6-24 HRS) Nasopharyngeal Nasopharyngeal Swab     Status: None   Collection Time: 08/07/20 11:22 AM   Specimen: Nasopharyngeal Swab  Result Value Ref Range Status   SARS Coronavirus 2 NEGATIVE NEGATIVE Final    Comment: (NOTE) SARS-CoV-2 target nucleic acids are NOT DETECTED.  The SARS-CoV-2 RNA is generally detectable in upper and lower respiratory specimens during the acute phase of infection. Negative results do not preclude SARS-CoV-2 infection, do not rule out co-infections with other pathogens, and should not be used as the sole basis for treatment or other patient management decisions. Negative results must be combined with clinical observations, patient history, and epidemiological information. The expected result is Negative.  Fact Sheet for Patients: SugarRoll.be  Fact Sheet for Healthcare Providers: https://www.woods-mathews.com/  This test is not yet approved or cleared by the [place] FDA and  has been authorized for detection and/or diagnosis of SARS-CoV-2 by FDA under an Emergency Use Authorization (EUA). This EUA will remain  in effect (meaning this  test can be used) for the duration of the COVID-19 declaration under Se ction 564(b)(1) of the Act, 21 U.S.C. section 360bbb-3(b)(1), unless the authorization is terminated or revoked sooner.  Performed at [facility], [place] [address] [place], [place] [address]      Labs: Basic Metabolic Panel: Recent Labs  Lab 08/03/20 0150 08/07/20 1514  NA 137 140  K 4.0 4.0  CL 107 109  CO2 22 21*  GLUCOSE 73 79  BUN 19 8  CREATININE 0.78 0.78  CALCIUM 9.0 9.0  MG  --  1.6*   Liver Function Tests: Recent Labs  Lab 08/03/20 0150  AST 40  ALT 42  ALKPHOS 93  BILITOT 0.8  PROT 5.8*  ALBUMIN 2.8*   No  results for input(s): LIPASE, AMYLASE in the last 168 hours. No results for input(s): AMMONIA in the last 168 hours. CBC: No results for input(s): WBC, NEUTROABS, HGB, HCT, MCV, PLT in the last 168 hours. Cardiac Enzymes: No results for input(s): CKTOTAL, CKMB, CKMBINDEX, TROPONINI in the last 168 hours. BNP: BNP (last 3 results) Recent Labs    01/31/20 2100  BNP 43.7    ProBNP (last 3 results) No results for input(s): PROBNP in the last 8760 hours.  CBG: No results for input(s): GLUCAP in the last 168 hours.     Signed:  Kayleen Memos, MD [facility] 08/09/2020, 11:06 AM

## 2020-08-09 NOTE — TOC Transition Note (Signed)
Transition of Care [facility]) - CM/SW Discharge Note   Patient Details  Name: Richard [name] MRN: [id number] Date of Birth: Aug 13, 1940  Transition of [facility]) CM/SW Contact:  Vinie Sill, [place] Phone Number: 08/09/2020, 11:15 AM  Patient will Discharge to: [place]  Discharge Date: 08/09/2020 Family Notified: [name],daughter  Transport By: Corey Harold   RN-please call patient's daughter, [name] [phone] when the patient is actually leaving the unit.   Per MD patient is ready for discharge. RN, patient, and facility notified of DC. Discharge Summary sent to facility. RN given number for [phone], Room 108. Ambulance transport requested for patient.   Clinical Social Worker signing off.  Thurmond Butts, MSW, LCSW Clinical Social Worker    Clinical Narrative:       Final next level of care: Skilled Nursing Facility Barriers to Discharge: Barriers Resolved   Patient Goals and CMS Choice        Discharge Placement                Patient to be transferred to facility by: [place] Name of family member notified: [name],daughter Patient and family notified of of transfer: 08/09/20  Discharge Plan and Services In-house Referral: Clinical Social Work                                   Social Determinants of Health (SDOH) Interventions     Readmission Risk Interventions Readmission Risk Prevention Plan 05/30/2019  Transportation Screening Complete  Medication Review Press photographer) Complete  PCP or Specialist appointment within 3-5 days of discharge Complete  HRI or [place] Complete  SW Recovery Care/Counseling Consult Complete  [place] Not Applicable  Some recent data might be hidden

## 2020-08-10 ENCOUNTER — Telehealth: Payer: Self-pay

## 2020-08-10 NOTE — Telephone Encounter (Signed)
Transition Care Management Unsuccessful Follow-up Telephone Call  Date of discharge and from where:  [facility]  Attempts:  1st aptemt  Reason for unsuccessful TCM follow-up call:  Unable to reach patient left VM to call back this nurse. Name and contact info provided.

## 2020-08-13 ENCOUNTER — Telehealth: Payer: Self-pay

## 2020-08-13 NOTE — Telephone Encounter (Signed)
Transition Care Management Follow-up Telephone Call Date of discharge and from where: 08/09/2020, [facility]   Patient discharged to [facility]

## 2020-09-12 ENCOUNTER — Telehealth (INDEPENDENT_AMBULATORY_CARE_PROVIDER_SITE_OTHER): Payer: Self-pay | Admitting: Primary Care

## 2020-09-12 ENCOUNTER — Telehealth: Payer: Self-pay | Admitting: Medical Oncology

## 2020-09-12 NOTE — Telephone Encounter (Signed)
Asking for order for UA due to foul smelling urine.  Pt bedbound.  Pt has not been seen  By Julien Nordmann since 06/21  I referred her to PCP or Doctors making  House calls.

## 2020-09-12 NOTE — Telephone Encounter (Signed)
Called to request verbal orders for patient to have:  Skilled nursing care, a urinalysis w/culture and sensitivity because of strong odor, Education officer, museum for patient to be approved for [employer].  Please advise and call to confirm at [phone]

## 2020-09-13 NOTE — Telephone Encounter (Signed)
Left voicemail giving verbal orders.

## 2020-09-14 ENCOUNTER — Other Ambulatory Visit: Payer: Self-pay | Admitting: Internal Medicine

## 2020-09-18 ENCOUNTER — Encounter: Payer: Self-pay | Admitting: Primary Care

## 2020-09-25 ENCOUNTER — Telehealth: Payer: Self-pay

## 2020-09-25 NOTE — Telephone Encounter (Signed)
teCopied from [place] [phone]. Topic: General - Other >> Sep 25, 2020 11:41 AM Tessa Lerner A wrote: Reason for CRM: Hinton Dyer with [facility] has made contact requesting to further discuss the results of patient's urinalysis  Hinton Dyer would like to know when the patient will be receiving a prescription to help treat the symptoms of their urinary discomfort  Please contact to further advise when possible

## 2020-09-26 ENCOUNTER — Other Ambulatory Visit (INDEPENDENT_AMBULATORY_CARE_PROVIDER_SITE_OTHER): Payer: Self-pay | Admitting: Primary Care

## 2020-09-26 DIAGNOSIS — R300 Dysuria: Secondary | ICD-10-CM

## 2020-09-26 MED ORDER — CIPROFLOXACIN HCL 500 MG PO TABS: 500.0000 mg | ORAL_TABLET | Freq: Two times a day (BID) | ORAL | 0 refills | Status: DC

## 2020-09-26 NOTE — Telephone Encounter (Signed)
Patient has had urinalysis and culture

## 2020-09-26 NOTE — Telephone Encounter (Signed)
Sent to PCP ?

## 2020-12-10 ENCOUNTER — Emergency Department (HOSPITAL_COMMUNITY): Payer: Medicare Other

## 2020-12-10 ENCOUNTER — Encounter (HOSPITAL_COMMUNITY): Payer: Self-pay

## 2020-12-10 ENCOUNTER — Inpatient Hospital Stay (HOSPITAL_COMMUNITY)
Admission: EM | Admit: 2020-12-10 | Discharge: 2020-12-13 | DRG: 951 | Disposition: A | Discharge status: Hospice | Payer: Medicare Other | Attending: Family Medicine | Admitting: Family Medicine

## 2020-12-10 ENCOUNTER — Other Ambulatory Visit: Payer: Self-pay

## 2020-12-10 DIAGNOSIS — Z8616 Personal history of COVID-19: Secondary | ICD-10-CM

## 2020-12-10 DIAGNOSIS — L89211 Pressure ulcer of right hip, stage 1: Secondary | ICD-10-CM | POA: Diagnosis present

## 2020-12-10 DIAGNOSIS — Z66 Do not resuscitate: Secondary | ICD-10-CM | POA: Diagnosis present

## 2020-12-10 DIAGNOSIS — Z801 Family history of malignant neoplasm of trachea, bronchus and lung: Secondary | ICD-10-CM

## 2020-12-10 DIAGNOSIS — Z825 Family history of asthma and other chronic lower respiratory diseases: Secondary | ICD-10-CM

## 2020-12-10 DIAGNOSIS — Z86711 Personal history of pulmonary embolism: Secondary | ICD-10-CM

## 2020-12-10 DIAGNOSIS — L89229 Pressure ulcer of left hip, unspecified stage: Secondary | ICD-10-CM | POA: Diagnosis not present

## 2020-12-10 DIAGNOSIS — Z681 Body mass index (BMI) 19 or less, adult: Secondary | ICD-10-CM | POA: Diagnosis not present

## 2020-12-10 DIAGNOSIS — F0280 Dementia in other diseases classified elsewhere without behavioral disturbance: Secondary | ICD-10-CM | POA: Diagnosis present

## 2020-12-10 DIAGNOSIS — R64 Cachexia: Secondary | ICD-10-CM | POA: Diagnosis present

## 2020-12-10 DIAGNOSIS — S81802A Unspecified open wound, left lower leg, initial encounter: Secondary | ICD-10-CM

## 2020-12-10 DIAGNOSIS — L89510 Pressure ulcer of right ankle, unstageable: Secondary | ICD-10-CM | POA: Diagnosis present

## 2020-12-10 DIAGNOSIS — L89326 Pressure-induced deep tissue damage of left buttock: Secondary | ICD-10-CM | POA: Diagnosis present

## 2020-12-10 DIAGNOSIS — Z7401 Bed confinement status: Secondary | ICD-10-CM | POA: Diagnosis not present

## 2020-12-10 DIAGNOSIS — I10 Essential (primary) hypertension: Secondary | ICD-10-CM | POA: Diagnosis present

## 2020-12-10 DIAGNOSIS — Z7901 Long term (current) use of anticoagulants: Secondary | ICD-10-CM

## 2020-12-10 DIAGNOSIS — I480 Paroxysmal atrial fibrillation: Secondary | ICD-10-CM | POA: Diagnosis present

## 2020-12-10 DIAGNOSIS — Z515 Encounter for palliative care: Secondary | ICD-10-CM

## 2020-12-10 DIAGNOSIS — L89222 Pressure ulcer of left hip, stage 2: Secondary | ICD-10-CM | POA: Diagnosis present

## 2020-12-10 DIAGNOSIS — C3491 Malignant neoplasm of unspecified part of right bronchus or lung: Secondary | ICD-10-CM | POA: Diagnosis present

## 2020-12-10 DIAGNOSIS — L89520 Pressure ulcer of left ankle, unstageable: Secondary | ICD-10-CM | POA: Diagnosis present

## 2020-12-10 DIAGNOSIS — E7800 Pure hypercholesterolemia, unspecified: Secondary | ICD-10-CM | POA: Diagnosis present

## 2020-12-10 DIAGNOSIS — Z800 Family history of malignant neoplasm of digestive organs: Secondary | ICD-10-CM

## 2020-12-10 DIAGNOSIS — Z87891 Personal history of nicotine dependence: Secondary | ICD-10-CM | POA: Diagnosis not present

## 2020-12-10 DIAGNOSIS — G309 Alzheimer's disease, unspecified: Secondary | ICD-10-CM

## 2020-12-10 LAB — CBC WITH DIFFERENTIAL/PLATELET
Abs Immature Granulocytes: 0.13 10*3/uL — ABNORMAL HIGH (ref 0.00–0.07)
Basophils Absolute: 0 10*3/uL (ref 0.0–0.1)
Basophils Relative: 0 %
Eosinophils Absolute: 0 10*3/uL (ref 0.0–0.5)
Eosinophils Relative: 0 %
HCT: 45.8 % (ref 39.0–52.0)
Hemoglobin: 14.9 g/dL (ref 13.0–17.0)
Immature Granulocytes: 1 %
Lymphocytes Relative: 12 %
Lymphs Abs: 1.9 10*3/uL (ref 0.7–4.0)
MCH: 21.1 pg — ABNORMAL LOW (ref 26.0–34.0)
MCHC: 32.5 g/dL (ref 30.0–36.0)
MCV: 65 fL — ABNORMAL LOW (ref 80.0–100.0)
Monocytes Absolute: 1 10*3/uL (ref 0.1–1.0)
Monocytes Relative: 6 %
Neutro Abs: 13.6 10*3/uL — ABNORMAL HIGH (ref 1.7–7.7)
Neutrophils Relative %: 81 %
Platelets: 564 10*3/uL — ABNORMAL HIGH (ref 150–400)
RBC: 7.05 MIL/uL — ABNORMAL HIGH (ref 4.22–5.81)
RDW: 18.7 % — ABNORMAL HIGH (ref 11.5–15.5)
WBC: 16.7 10*3/uL — ABNORMAL HIGH (ref 4.0–10.5)
nRBC: 0 % (ref 0.0–0.2)

## 2020-12-10 MED ORDER — LORAZEPAM 2 MG/ML PO CONC: 1.0000 mg | ORAL | Status: DC | PRN

## 2020-12-10 MED ORDER — HALOPERIDOL 0.5 MG PO TABS
0.5000 mg | ORAL_TABLET | ORAL | Status: DC | PRN
  Filled 2020-12-10: qty 1

## 2020-12-10 MED ORDER — HALOPERIDOL LACTATE 5 MG/ML IJ SOLN
0.5000 mg | INTRAMUSCULAR | Status: DC | PRN
  Administered 2020-12-12: 0.5 mg via INTRAVENOUS
  Filled 2020-12-10: qty 1

## 2020-12-10 MED ORDER — MORPHINE SULFATE (CONCENTRATE) 10 MG/0.5ML PO SOLN
5.0000 mg | ORAL | Status: DC | PRN
Start: 2020-12-10 — End: 2020-12-14

## 2020-12-10 MED ORDER — LORAZEPAM 1 MG PO TABS: 1.0000 mg | ORAL_TABLET | ORAL | Status: DC | PRN

## 2020-12-10 MED ORDER — MORPHINE SULFATE (CONCENTRATE) 10 MG/0.5ML PO SOLN
5.0000 mg | ORAL | Status: DC | PRN
  Administered 2020-12-11: 5 mg via ORAL
  Filled 2020-12-10: qty 0.5

## 2020-12-10 MED ORDER — ONDANSETRON HCL 4 MG/2ML IJ SOLN
4.0000 mg | Freq: Once | INTRAMUSCULAR | Status: AC
  Administered 2020-12-10: 4 mg via INTRAVENOUS
  Filled 2020-12-10: qty 2

## 2020-12-10 MED ORDER — ONDANSETRON 4 MG PO TBDP: 4.0000 mg | ORAL_TABLET | Freq: Four times a day (QID) | ORAL | Status: DC | PRN

## 2020-12-10 MED ORDER — LACTATED RINGERS IV SOLN: INTRAVENOUS | Status: DC

## 2020-12-10 MED ORDER — MORPHINE SULFATE (CONCENTRATE) 10 MG/0.5ML PO SOLN
10.0000 mg | ORAL | Status: DC | PRN
Start: 2020-12-10 — End: 2020-12-14
  Administered 2020-12-11 – 2020-12-13 (×2): 10 mg via ORAL
  Filled 2020-12-10 (×2): qty 0.5

## 2020-12-10 MED ORDER — ONDANSETRON HCL 4 MG/2ML IJ SOLN: 4.0000 mg | Freq: Four times a day (QID) | INTRAMUSCULAR | Status: DC | PRN

## 2020-12-10 MED ORDER — LORAZEPAM 2 MG/ML IJ SOLN
1.0000 mg | INTRAMUSCULAR | Status: DC | PRN
  Administered 2020-12-12 – 2020-12-13 (×3): 1 mg via INTRAVENOUS
  Filled 2020-12-10 (×3): qty 1

## 2020-12-10 MED ORDER — LACTATED RINGERS IV BOLUS (SEPSIS)
1000.0000 mL | Freq: Once | INTRAVENOUS | Status: AC
  Administered 2020-12-10: 1000 mL via INTRAVENOUS

## 2020-12-10 MED ORDER — HALOPERIDOL LACTATE 2 MG/ML PO CONC
0.5000 mg | ORAL | Status: DC | PRN
  Filled 2020-12-10: qty 0.3

## 2020-12-10 MED ORDER — ACETAMINOPHEN 160 MG/5ML PO SOLN
650.0000 mg | Freq: Four times a day (QID) | ORAL | Status: DC
  Administered 2020-12-10 – 2020-12-13 (×10): 650 mg via ORAL
  Filled 2020-12-10 (×12): qty 20.3

## 2020-12-10 MED ORDER — FENTANYL CITRATE (PF) 100 MCG/2ML IJ SOLN
50.0000 ug | Freq: Once | INTRAMUSCULAR | Status: AC
  Administered 2020-12-10: 50 ug via INTRAVENOUS
  Filled 2020-12-10: qty 2

## 2020-12-10 MED ORDER — MORPHINE SULFATE (CONCENTRATE) 10 MG/0.5ML PO SOLN
5.0000 mg | ORAL | Status: DC
Start: 2020-12-10 — End: 2020-12-14
  Administered 2020-12-10 – 2020-12-13 (×17): 5 mg via ORAL
  Filled 2020-12-10 (×16): qty 0.5

## 2020-12-10 NOTE — ED Notes (Signed)
Pt uncooperative with positioning for Xray and Foley, unable to move Pt, and Pt refusing to move into position to complete.

## 2020-12-10 NOTE — Progress Notes (Addendum)
..  Transition of Care [facility]) - Emergency Department Mini Assessment   Patient Details  Name: Richard [name] MRN: [id number] Date of Birth: 1940-11-21  Transition [facility]) CM/SW Contact:    Erenest Rasher, RN Phone Number: [phone] 12/10/2020, 1:26 PM   Clinical Narrative:    1:26 pm TOC CM attempted call to speak to dtr, [name] to discuss [facility]. Left HIPAA complaint voice message for return call.   1:50 pm TOC CM spoke to pt's dtr, she agrees to [facility] or [facility]. Waiting Palliative to see pt. ED provider updated. Referral made to [facility].   ED Mini Assessment: What brought you to the Emergency Department? : unable to care for himself  Barriers to Discharge: Continued Medical Work up  [employer] interventions: arrange [facility]     Interventions which prevented an admission or readmission: Other (must enter comment)    Patient Contact and Communications Key Contact 1: [name]     Contact Date: 12/10/20,   Contact time: 1326 Contact Phone Number: [phone]  Admission diagnosis:  possible sepsis Patient Active Problem List   Diagnosis Date Noted   Acute metabolic encephalopathy 97/28/2060   Chest pain 08/01/2020   Severe sepsis ([place]) 05/09/2020   Altered level of consciousness 05/09/2020   Hypothermia 05/09/2020   UTI (urinary tract infection) 05/09/2020   Acute respiratory disease due to COVID-19 virus 02/17/2020   Acute respiratory failure due to COVID-19 [facility]) 02/17/2020   Syncope 11/04/2019   Fall at home, initial encounter 11/04/2019   Anemia 09/25/2019   Symptomatic anemia 09/24/2019   Paroxysmal atrial fibrillation ([place]) 07/06/2019   Pressure injury of skin 05/21/2019   Anorexia 05/20/2019   HCAP (healthcare-associated pneumonia) 05/18/2019   Protein-calorie malnutrition, severe ([place]) 05/18/2019   Sepsis ([place]) 05/08/2019   History of pulmonary embolism 03/30/2019   Pure  hypercholesterolemia 03/30/2019   SOB (shortness of breath) 03/30/2019   Palliative care by specialist    DNR (do not resuscitate) discussion    Poor appetite 01/05/2019   Goals of care, counseling/discussion 12/28/2018   Encounter for antineoplastic chemotherapy 12/28/2018   Stage III squamous cell carcinoma of right lung ([place]) 12/28/2018   Dementia without behavioral disturbance ([place]) 12/16/2018   Lung mass 12/15/2018   Pelvic mass in male 12/15/2018   Postobstructive pneumonia 12/15/2018   Hypertension    PCP:  [name], NP Pharmacy:   [place], [place] - [address] [place] [address] Phone: [phone] Fax: [phone]  [place] - [address] [place] [address] Phone: [phone] Fax: [place] [address] Phone: [phone] Fax: [phone]

## 2020-12-10 NOTE — Consult Note (Signed)
Palliative Care Consultation Note  80 year old man with lung cancer, progressive dementia, bed-bound since Covid infection last year, contracted and multiple full thickness pressure wounds. Family has elected for comfort care-they are interested in hospice options.  Patient found very confused this evening. He was thirsty and drank ice water and coke without signs of aspiration. He complained of feeling cold and asked for a warm blanket. He had pain with raising the head of the bed and was refusing care.   Recommendations:  DNR, Comfort Measures Only Scheduled Morphine 5mg  SL q4, 10mg  q1 prn dose Scheduled Haldol Scheduled liquid tylenol Comfort Feeding as tolerated, oral care. Wounds are extensive-leaving open and minimal manipulation is probably best approach-if draining can cover with ABD.  Prognosis in setting of sepsis w/ comfort measures <2 weeks. Recommend a hospice facility for EOL care.  Richard Hacker, DO Palliative Medicine   Time: 30 minutes Greater than 50%  of this time was spent counseling and coordinating care related to the above assessment and plan.

## 2020-12-10 NOTE — ED Triage Notes (Signed)
EMS reports from home, daughter called for check on Pt. Pt has hx of lung CA. EMS states Pt is on second floor in hospital bed and found in urine soaked bed. EMS notes Multiple bed sores of multiple stages across back side.   BP 112/60 HR 80 RR 22 Temp 97.3

## 2020-12-10 NOTE — ED Notes (Signed)
Multiple attempts by myself, IV team and Phlebotomy to draw labs unsuccessful.

## 2020-12-10 NOTE — ED Provider Notes (Signed)
[facility] DEPT Provider Note   CSN: [id number] Arrival date & time: 12/10/20  1036     History Chief Complaint  Patient presents with   Medical Clearance   Hospice Referral    Richard [name] is a 80 y.o. male.  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  He has not followed up with oncology, and it is essentially untreated.  He presents via EMS from home.  His daughter has been attempting to care for him, but EMS reports that the patient was a urine saturated mattress. Urine pooled on the floor with pressure on the mattress.  The history is provided by the EMS personnel and a relative (daughter via phone). The history is limited by the condition of the patient (severe dementia).  Rash Location:  Leg Leg rash location:  L hip, L leg, L foot, R lower leg and R foot Quality: painful, redness and weeping   Pain details:    Quality:  Unable to specify   Severity:  Severe   Onset quality:  Gradual   Duration: family says days but this information seems incompatible with physical exam findings.   Timing:  Constant   Progression:  Worsening Severity:  Severe Onset quality:  Gradual Duration: days to weeks. Timing:  Constant Progression:  Worsening Chronicity:  New Context comment:  Bed bound with contractures of limbs Relieved by:  Nothing Worsened by:  Contact Ineffective treatments:  None tried Associated symptoms: no fever and not vomiting       Past Medical History:  Diagnosis Date   Acute pulmonary embolism ([place]) 01/30/2019   Acute respiratory failure with hypoxia ([place]) 01/30/2019   AKI (acute kidney injury) ([place]) 01/30/2019   Atrial fibrillation with RVR (HCC)    Dementia (HCC)    High cholesterol    Hypertension    Lung mass 12/2018   Severe sepsis ([place]) 01/30/2019    Patient Active Problem List   Diagnosis Date Noted   Acute metabolic encephalopathy 37/90/2409   Chest pain 08/01/2020   Severe sepsis ([place])  05/09/2020   Altered level of consciousness 05/09/2020   Hypothermia 05/09/2020   UTI (urinary tract infection) 05/09/2020   Acute respiratory disease due to COVID-19 virus 02/17/2020   Acute respiratory failure due to COVID-19 [facility]) 02/17/2020   Syncope 11/04/2019   Fall at home, initial encounter 11/04/2019   Anemia 09/25/2019   Symptomatic anemia 09/24/2019   Paroxysmal atrial fibrillation ([place]) 07/06/2019   Pressure injury of skin 05/21/2019   Anorexia 05/20/2019   HCAP (healthcare-associated pneumonia) 05/18/2019   Protein-calorie malnutrition, severe ([place]) 05/18/2019   Sepsis ([place]) 05/08/2019   History of pulmonary embolism 03/30/2019   Pure hypercholesterolemia 03/30/2019   SOB (shortness of breath) 03/30/2019   Palliative care by specialist    DNR (do not resuscitate) discussion    Poor appetite 01/05/2019   Goals of care, counseling/discussion 12/28/2018   Encounter for antineoplastic chemotherapy 12/28/2018   Stage III squamous cell carcinoma of right lung ([place]) 12/28/2018   Dementia without behavioral disturbance ([place]) 12/16/2018   Lung mass 12/15/2018   Pelvic mass in male 12/15/2018   Postobstructive pneumonia 12/15/2018   Hypertension     Past Surgical History:  Procedure Laterality Date   IR GASTROSTOMY TUBE MOD SED  05/25/2019   IR GASTROSTOMY TUBE REMOVAL  09/08/2019   JOINT REPLACEMENT     VIDEO BRONCHOSCOPY WITH ENDOBRONCHIAL ULTRASOUND N/A 12/16/2018   Procedure: VIDEO BRONCHOSCOPY WITH ENDOBRONCHIAL ULTRASOUND AND  FLUROSCOPY;  Surgeon: [name], MD;  Location: [facility] OR;  Service: Pulmonary;  Laterality: N/A;       Family History  Problem Relation Age of Onset   Stomach cancer Mother    COPD Father    Lung cancer Brother    Lung cancer Brother     Social History   Tobacco Use   Smoking status: Former    Packs/day: 1.00    Years: 60.00    Pack years: 60.00    Types: Cigarettes   Smokeless tobacco: Never  Vaping Use   Vaping Use: Never  used  Substance Use Topics   Alcohol use: Yes    Comment: occasional   Drug use: No    Home Medications Prior to Admission medications   Medication Sig Start Date End Date Taking? Authorizing Provider  acetaminophen (TYLENOL) 325 MG tablet Take 650 mg by mouth every 6 (six) hours as needed for mild pain.    [provider]  ciprofloxacin (CIPRO) 500 MG tablet Take 1 tablet (500 mg total) by mouth 2 (two) times daily. 09/26/20   [name], NP  ELIQUIS 5 MG TABS tablet Take 1 tablet by mouth twice daily 09/15/20   Curt Bears, MD  feeding supplement (ENSURE ENLIVE / ENSURE PLUS) LIQD Take 237 mLs by mouth 3 (three) times daily between meals. Patient taking differently: Take 237 mLs by mouth daily. 05/13/20   Geradine Girt, DO  guaiFENesin (MUCINEX) 600 MG 12 hr tablet Take 600 mg by mouth 2 (two) times daily as needed for cough or to loosen phlegm.    [provider]  Multiple Vitamin (MULTIVITAMIN WITH MINERALS) TABS tablet Take 1 tablet by mouth daily. 02/21/20   Mercy Riding, MD    Allergies    Patient has no known allergies.  Review of Systems   Review of Systems  Unable to perform ROS: Dementia  Constitutional:  Negative for fever.  Gastrointestinal:  Negative for vomiting.  Skin:  Positive for wound.   Physical Exam Updated Vital Signs BP 123/74   Pulse (!) 52   Temp 99.1 F (37.3 C) (Rectal)   Resp 18   SpO2 90%   Physical Exam Vitals and nursing note reviewed.  Constitutional:      Appearance: He is ill-appearing.     Comments: Reeks of urine  HENT:     Head: Normocephalic and atraumatic.     Mouth/Throat:     Mouth: Mucous membranes are dry.  Eyes:     Conjunctiva/sclera: Conjunctivae normal.  Cardiovascular:     Rate and Rhythm: Normal rate and regular rhythm.     Heart sounds: Normal heart sounds.     Comments: Distal pulses are present on the lower extremities Pulmonary:     Effort: Pulmonary effort is normal.     Breath  sounds: Normal breath sounds.  Musculoskeletal:     Cervical back: Normal range of motion.     Comments: Slight edema of the right foot greater than the left.  Skin:    General: Skin is warm.     Comments: The patient has evidence of subcutaneous edema in his flank and low back.  He has ulcers at pressure points throughout his lower extremities, left greater than right.  There is a large ulcer at the left proximal shin extending to its midpoint.  There is exposed bone here.  There are multiple other, shallower ulcers present as well  Neurological:     Mental Status:  He is alert. He is disoriented.     Comments: Unable to follow commands Localizes pain to all extremities Severe contractures at the hips and knees without obvious focal deficit  Psychiatric:        Mood and Affect: Mood normal.        Behavior: Behavior normal.       ED Results / Procedures / Treatments   Labs (all labs ordered are listed, but only abnormal results are displayed) Labs Reviewed  CBC WITH DIFFERENTIAL/PLATELET - Abnormal; Notable for the following components:      Result Value   WBC 16.7 (*)    RBC 7.05 (*)    MCV 65.0 (*)    MCH 21.1 (*)    RDW 18.7 (*)    Platelets 564 (*)    Neutro Abs 13.6 (*)    Abs Immature Granulocytes 0.13 (*)    All other components within normal limits  URINE CULTURE  CULTURE, BLOOD (ROUTINE X 2)  CULTURE, BLOOD (ROUTINE X 2)  SARS CORONAVIRUS 2 (TAT 6-24 HRS)  LACTIC ACID, PLASMA  LACTIC ACID, PLASMA  URINALYSIS, ROUTINE W REFLEX MICROSCOPIC  COMPREHENSIVE METABOLIC PANEL  TROPONIN I (HIGH SENSITIVITY)  TROPONIN I (HIGH SENSITIVITY)    EKG EKG Interpretation  Date/Time:  Monday December 10 2020 11:21:30 EDT Ventricular Rate:  132 PR Interval:  106 QRS Duration: 130 QT Interval:  322 QTC Calculation: 478 R Axis:   72 Text Interpretation: Sinus tachycardia Atrial premature complex Left atrial enlargement IVCD, consider atypical LBBB: this is new when compared  to prior EKGs Confirmed by Lorre Munroe ([id number]) on 12/10/2020 11:53:21 AM  Radiology No results found.  Procedures Procedures   Medications Ordered in ED Medications  lactated ringers bolus 1,000 mL (has no administration in time range)  fentaNYL (SUBLIMAZE) injection 50 mcg (has no administration in time range)  ondansetron (ZOFRAN) injection 4 mg (has no administration in time range)    ED Course  I have reviewed the triage vital signs and the nursing notes.  Pertinent labs & imaging results that were available during my care of the patient were reviewed by me and considered in my medical decision making (see chart for details).  Clinical Course as of 12/11/20 0714  Mon Dec 10, 2020  1124 I spoke with Dr. Rowe Pavy of palliative care.  [AW]  1304 It was extremely difficult to obtain blood from this patient.  Only scant amount could be obtained, and based on his condition, a likely terminal illness, I did not think it was important to continue to put him through more invasive procedures such as central line.  He also could not tolerate his chest x-ray.  Again, his comfort seems to be more important than further diagnostic measures.  We will continue to hydrate him and hopefully be able to obtain further work-up as he tolerates this. [AW]  9476 Case management has evaluated the patient and will make hospice referral. Palliative c/s pending. She will file APS report as well given home conditions, the state of his wounds. [AW]    Clinical Course User Index [AW] Arnaldo Natal, MD   MDM Rules/Calculators/A&P                          [place] presented to the emergency department via EMS.  He has squamous cell carcinoma of the lung and has not received treatment in mellitus.  He has contractures and is bedbound.  He has  not received adequate wound care for his pressure ulcers.  He was found to be cachectic.  IV access was exceedingly difficult, and the IV team had to be consulted.   Unfortunately, we could not obtain a full spectrum of lab analyses on him secondary to his dehydration.  X-ray was also difficult because of his contractures and his pain.  He is currently awaiting palliative care.  Case management is also evaluating the patient. Final Clinical Impression(s) / ED Diagnoses Final diagnoses:  Alzheimer's dementia without behavioral disturbance, unspecified timing of dementia onset ([place])  Stage III squamous cell carcinoma of right lung ([place])  Wound of left lower extremity, initial encounter  Pressure injury of skin of left hip, unspecified injury stage    Rx / DC Orders ED Discharge Orders     None        Arnaldo Natal, MD 12/11/20 [phone]

## 2020-12-10 NOTE — Progress Notes (Signed)
Engineer, maintenance [facility]) Hospital Liaison note.   Received request from [facility] for family interest in [facility]. [facility] is unable to offer a room today. Hospital Liaison will follow up tomorrow or sooner if a room becomes available.   A Please do not hesitate to call with questions.   Thank you,  Clementeen Hoof, RN, BSN

## 2020-12-10 NOTE — Progress Notes (Signed)
Two RT's attempted ordered blood draw for lab with no success- RN aware.

## 2020-12-10 NOTE — ED Notes (Signed)
[name] (daughter) [phone]

## 2020-12-10 NOTE — H&P (Signed)
History and Physical    Richard [name]:[id number] DOB: 12-30-1940 DOA: 12/10/2020  I have briefly reviewed the patient's prior medical records in [place]  PCP: [name], NP  Patient coming from: home  Chief Complaint: weakness, AMS  HPI: Richard [name] is a 80 y.o. male with medical history significant of stage III lung cancer not on any treatment, PAF, dementia, Covid infection 2021, history of PE, comes to the hospital brought by EMS. Apparently family has been trying to get him into hospice, now unable to take care for him and brought him to the hospital. Apparently when EMS arrived patient was in a poor living condition, matress was soaking with urine. He was brought to the ED. On my evaluation patient is alert, contracted, has underlying confusion and unable to adequately answer any of my questions  Daughter is at bedside, tells me that he has had progressive decline and they were looking to get hospice arranged in their home.  That has not happened and they are having increased difficulties in caring for the patient.  ED Course: in the ED he was found to be tachycardic, temp 99.1, tachypneic. He had an IV placed, received fluids but took the IV out. He has refused labs and refused a CXR  Review of Systems: All systems reviewed, and apart from HPI, all negative  Past Medical History:  Diagnosis Date   Acute pulmonary embolism ([place]) 01/30/2019   Acute respiratory failure with hypoxia ([place]) 01/30/2019   AKI (acute kidney injury) ([place]) 01/30/2019   Atrial fibrillation with RVR (HCC)    Dementia (HCC)    High cholesterol    Hypertension    Lung mass 12/2018   Severe sepsis ([place]) 01/30/2019    Past Surgical History:  Procedure Laterality Date   IR GASTROSTOMY TUBE MOD SED  05/25/2019   IR GASTROSTOMY TUBE REMOVAL  09/08/2019   JOINT REPLACEMENT     VIDEO BRONCHOSCOPY WITH ENDOBRONCHIAL ULTRASOUND N/A 12/16/2018   Procedure: VIDEO BRONCHOSCOPY WITH ENDOBRONCHIAL  ULTRASOUND AND FLUROSCOPY;  Surgeon: [name], MD;  Location: [facility] OR;  Service: Pulmonary;  Laterality: N/A;     reports that he has quit smoking. His smoking use included cigarettes. He has a 60.00 pack-year smoking history. He has never used smokeless tobacco. He reports current alcohol use. He reports that he does not use drugs.  No Known Allergies  Family History  Problem Relation Age of Onset   Stomach cancer Mother    COPD Father    Lung cancer Brother    Lung cancer Brother     Prior to Admission medications   Medication Sig Start Date End Date Taking? Authorizing Provider  acetaminophen (TYLENOL) 325 MG tablet Take 650 mg by mouth every 6 (six) hours as needed for mild pain.    [provider]  ciprofloxacin (CIPRO) 500 MG tablet Take 1 tablet (500 mg total) by mouth 2 (two) times daily. 09/26/20   [name], NP  ELIQUIS 5 MG TABS tablet Take 1 tablet by mouth twice daily 09/15/20   Curt Bears, MD  feeding supplement (ENSURE ENLIVE / ENSURE PLUS) LIQD Take 237 mLs by mouth 3 (three) times daily between meals. Patient taking differently: Take 237 mLs by mouth daily. 05/13/20   Geradine Girt, DO  guaiFENesin (MUCINEX) 600 MG 12 hr tablet Take 600 mg by mouth 2 (two) times daily as needed for cough or to loosen phlegm.    [provider]  Multiple Vitamin (MULTIVITAMIN  WITH MINERALS) TABS tablet Take 1 tablet by mouth daily. 02/21/20   Mercy Riding, MD    Physical Exam: Vitals:   12/10/20 1521 12/10/20 1600 12/10/20 1630 12/10/20 1700  BP: 101/66 125/83 115/79 121/83  Pulse: (!) 127 (!) 127 (!) 129 (!) 129  Resp: (!) 33 (!) 32 (!) 24 (!) 23  Temp:      TempSrc:      SpO2: 100% 99% 99% 99%    Constitutional: in distress, uncomfortable Eyes: no scleral icterus  ENMT: Mucous membranes are dry Neck: normal, supple Respiratory: clear to auscultation bilaterally, no wheezing, no crackles, overall diminished breath sounds Cardiovascular:  rrr, tachycardic  Abdomen: no tenderness, no masses palpated. Bowel sounds positive.  Skin: multiple lesions as below Neurologic: non focal, contracted Psychiatric: AxOx0     Labs on Admission: I have personally reviewed following labs and imaging studies  CBC: Recent Labs  Lab 12/10/20 1238  WBC 16.7*  NEUTROABS 13.6*  HGB 14.9  HCT 45.8  MCV 65.0*  PLT 027*   Basic Metabolic Panel: No results for input(s): NA, K, CL, CO2, GLUCOSE, BUN, CREATININE, CALCIUM, MG, PHOS in the last 168 hours. Liver Function Tests: No results for input(s): AST, ALT, ALKPHOS, BILITOT, PROT, ALBUMIN in the last 168 hours. Coagulation Profile: No results for input(s): INR, PROTIME in the last 168 hours. BNP (last 3 results) No results for input(s): PROBNP in the last 8760 hours. CBG: No results for input(s): GLUCAP in the last 168 hours. Thyroid Function Tests: No results for input(s): TSH, T4TOTAL, FREET4, T3FREE, THYROIDAB in the last 72 hours. Urine analysis:    Component Value Date/Time   COLORURINE YELLOW 08/01/2020 0226   APPEARANCEUR CLOUDY (A) 08/01/2020 0226   LABSPEC 1.015 08/01/2020 0226   PHURINE 8.0 08/01/2020 0226   GLUCOSEU NEGATIVE 08/01/2020 0226   HGBUR NEGATIVE 08/01/2020 0226   BILIRUBINUR NEGATIVE 08/01/2020 0226   KETONESUR NEGATIVE 08/01/2020 0226   PROTEINUR 100 (A) 08/01/2020 0226   NITRITE POSITIVE (A) 08/01/2020 0226   LEUKOCYTESUR LARGE (A) 08/01/2020 0226     Radiological Exams on Admission: No results found.  Assessment/Plan  Principal Problem Goals of care -Long discussion with the daughter at bedside as well as over the phone with her uncle.  They wish to focus his care was comfort, he is clearly suffering and told her daughter that "he is tired". -No antibiotics, no escalation of care, DNR in place.  Palliative formally consulted as well.  I suspect he needs residential hospice  Active Problems Stage III lung cancer -now comfort  PAF -now  focusing on comfort  SIRS-possible source from urine as he has had UTI in the past, can be a source.  Focusing on comfort  History of PE - comfort care  DVT prophylaxis: comfort measures  Code Status: DNR  Family Communication: daughter at bedside, uncle over the phone Disposition Plan: likely residential hospice Bed Type: medsurg Consults called: palliative  Obs/Inp: observation  Marzetta Board, MD, PhD [facility]  Contact via [email or web address]  12/10/2020, 5:56 PM

## 2020-12-10 NOTE — ED Provider Notes (Signed)
Patient care assumed at 1500. Patient brought in from home for evaluation following failure to thrive. He is significantly debilitated on evaluation with numerous decubitus wounds. Multiple staff members as well as myself have attempted IV access without success. Plan to admit for comfort measures pending hospice placement.   Quintella Reichert, MD 12/10/20 2308

## 2020-12-11 LAB — SARS CORONAVIRUS 2 (TAT 6-24 HRS): SARS Coronavirus 2: NEGATIVE

## 2020-12-11 NOTE — Progress Notes (Signed)
Manufacturing engineer [facility])  Noted recommendation for residential hospice at [facility].   We do not have a bed at [facility] to offer today. Patient still needs to be deemed eligible by Bing Ree MD. Once eligibility has been confirmed, [facility] will update family and hospital and then he can be placed on our waiting list for a bed.  Venia Carbon RN, BSN, [facility] Liaison

## 2020-12-11 NOTE — Progress Notes (Signed)
Spoke with patient brother Eddie Dibbles and answered his questions. He wanted to know if the patient would stay here at the hospital and I informed him that no that wasn't the plan at this time and he also wanted to know if the patient cancer had come back. I informed him that no test was done to test for cancer. Patient brother verbalized understanding and would like to be called tomorrow with an update.

## 2020-12-11 NOTE — Progress Notes (Signed)
PROGRESS NOTE    Richard [name]  VPX:[id number] DOB: [birth year] DOA: 12/10/2020 PCP: [name], NP   Brief Narrative:  HPI: Richard [name] is a 80 y.o. male with medical history significant of stage III lung cancer not on any treatment,PAF /history of PE, dementia, Covid infection 2021, history of PE, comes to the hospital brought by EMS. Apparently family has been trying to get him into hospice, now unable to take care for him and brought him to the hospital. Apparently when EMS arrived patient was in a poor living condition, matress was soaking with urine. He was brought to the ED. On my evaluation patient is alert, contracted, has underlying confusion and unable to adequately answer any of my questions   Daughter is at bedside, tells me that he has had progressive decline and they were looking to get hospice arranged in their home.  That has not happened and they are having increased difficulties in caring for the patient.   ED Course: in the ED he was found to be tachycardic, temp 99.1, tachypneic. He had an IV placed, received fluids but took the IV out. 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  Palliative on board and managing pain.  Awaiting bed at residential hospice facility.  Continue current management per palliative care.    DVT prophylaxis:    Code Status: DNR  Family Communication:  None present at bedside.   Status is: Inpatient  Remains inpatient appropriate because:Unsafe d/c plan  Dispo: The patient is from: Home              Anticipated d/c is to:  [place]              Patient currently is medically stable to d/c.   Difficult to place patient No        Estimated body mass index is 19.07 kg/m as calculated from the following:   Height  as of 05/08/20: 6\' 3"  (1.905 m).   Weight as of 05/19/20: 69.2 kg.  Pressure Injury 05/19/19 Hip Left;Lower Stage II -  Partial thickness loss of dermis presenting as a shallow open ulcer with a red, pink wound bed without slough. Stage II present with scab over it (Active)  05/19/19 0348  Location: Hip  Location Orientation: Left;Lower  Staging: Stage II -  Partial thickness loss of dermis presenting as a shallow open ulcer with a red, pink wound bed without slough.  Wound Description (Comments): Stage II present with scab over it  Present on Admission: Yes     Pressure Injury 05/19/19 Hip Right;Left Stage I -  Intact skin with non-blanchable redness of a localized area usually over a bony prominence. (Active)  05/19/19 0355  Location: Hip  Location Orientation: Right;Left  Staging: Stage I -  Intact skin with non-blanchable redness of a localized area usually over a bony prominence.  Wound Description (Comments):   Present on Admission:      Pressure Injury 12/10/20 Pretibial Left Unstageable - Full thickness tissue loss in which the base of the injury is covered by slough (yellow, tan, gray, green or brown) and/or eschar (tan, brown or black) in the wound bed. deep, yellow, draining (Active)  12/10/20   Location: Pretibial  Location Orientation: Left  Staging: Unstageable -  Full thickness tissue loss in which the base of the injury is covered by slough (yellow, tan, gray, green or brown) and/or eschar (tan, brown or black) in the wound bed.  Wound Description (Comments): deep, yellow, draining  Present on Admission: Yes     Pressure Injury 12/10/20 Ankle Anterior;Right;Medial Unstageable - Full thickness tissue loss in which the base of the injury is covered by slough (yellow, tan, gray, green or brown) and/or eschar (tan, brown or black) in the wound bed. yellow, draining (Active)  12/10/20   Location: Ankle  Location Orientation: Anterior;Right;Medial  Staging: Unstageable - Full  thickness tissue loss in which the base of the injury is covered by slough (yellow, tan, gray, green or brown) and/or eschar (tan, brown or black) in the wound bed.  Wound Description (Comments): yellow, draining  Present on Admission: Yes     Pressure Injury 12/10/20 Buttocks Left Deep Tissue Pressure Injury - Purple or maroon localized area of discolored intact skin or blood-filled blister due to damage of underlying soft tissue from pressure and/or shear. red, non draining (Active)  12/10/20   Location: Buttocks  Location Orientation: Left  Staging: Deep Tissue Pressure Injury - Purple or maroon localized area of discolored intact skin or blood-filled blister due to damage of underlying soft tissue from pressure and/or shear.  Wound Description (Comments): red, non draining  Present on Admission: Yes     Nutritional status:               Consultants:  Palliative care  Procedures:  None  Antimicrobials:  Anti-infectives (From admission, onward)    None          Subjective: Seen and examined.  Alert but not sure if he is oriented.  Looks comfortable and has no complaints.  Remains contracted.  Objective: Vitals:   12/10/20 1630 12/10/20 1700 12/10/20 1800 12/10/20 1948  BP: 115/79 121/83 119/76 109/64  Pulse: (!) 129 (!) 129 (!) 127 (!) 126  Resp: (!) 24 (!) 23 (!) 28 18  Temp:    97.6 F (36.4 C)  TempSrc:    Oral  SpO2: 99% 99% 100% 99%   No intake or output data in the 24 hours ending 12/11/20 1046 There were no vitals filed for this visit.  Examination:  General exam: Appears calm and comfortable but contracted Respiratory system: Clear to auscultation. Respiratory effort normal. Cardiovascular system: S1 & S2 heard, RRR. No JVD, murmurs, rubs, gallops or clicks. No pedal edema. Gastrointestinal system: Abdomen is nondistended, soft and nontender. No organomegaly or masses felt. Normal bowel sounds heard.    Data Reviewed: I have personally  reviewed following labs and imaging studies  CBC: Recent Labs  Lab 12/10/20 1238  WBC 16.7*  NEUTROABS 13.6*  HGB 14.9  HCT 45.8  MCV 65.0*  PLT 409*   Basic Metabolic Panel: No results for input(s): NA, K, CL, CO2, GLUCOSE, BUN, CREATININE, CALCIUM, MG, PHOS in the last 168 hours. GFR: CrCl cannot be calculated (Patient's most recent lab result is older than the maximum 21 days allowed.). Liver Function Tests: No results for input(s): AST, ALT, ALKPHOS, BILITOT, PROT, ALBUMIN in the last 168 hours. No results for input(s): LIPASE, AMYLASE in the last 168 hours. No results for input(s): AMMONIA in the last 168 hours. Coagulation Profile: No results for input(s): INR, PROTIME in the last 168 hours. Cardiac Enzymes: No results for input(s): CKTOTAL, CKMB, CKMBINDEX, TROPONINI in the last 168 hours. BNP (last 3 results) No results for  input(s): PROBNP in the last 8760 hours. HbA1C: No results for input(s): HGBA1C in the last 72 hours. CBG: No results for input(s): GLUCAP in the last 168 hours. Lipid Profile: No results for input(s): CHOL, HDL, LDLCALC, TRIG, CHOLHDL, LDLDIRECT in the last 72 hours. Thyroid Function Tests: No results for input(s): TSH, T4TOTAL, FREET4, T3FREE, THYROIDAB in the last 72 hours. Anemia Panel: No results for input(s): VITAMINB12, FOLATE, FERRITIN, TIBC, IRON, RETICCTPCT in the last 72 hours. Sepsis Labs: No results for input(s): PROCALCITON, LATICACIDVEN in the last 168 hours.  Recent Results (from the past 240 hour(s))  SARS CORONAVIRUS 2 (TAT 6-24 HRS) Nasopharyngeal Nasopharyngeal Swab     Status: None   Collection Time: 12/10/20 10:56 AM   Specimen: Nasopharyngeal Swab  Result Value Ref Range Status   SARS Coronavirus 2 NEGATIVE NEGATIVE Final    Comment: (NOTE) SARS-CoV-2 target nucleic acids are NOT DETECTED.  The SARS-CoV-2 RNA is generally detectable in upper and lower respiratory specimens during the acute phase of infection.  Negative results do not preclude SARS-CoV-2 infection, do not rule out co-infections with other pathogens, and should not be used as the sole basis for treatment or other patient management decisions. Negative results must be combined with clinical observations, patient history, and epidemiological information. The expected result is Negative.  Fact Sheet for Patients: SugarRoll.be  Fact Sheet for Healthcare Providers: https://www.woods-mathews.com/  This test is not yet approved or cleared by the [place] FDA and  has been authorized for detection and/or diagnosis of SARS-CoV-2 by FDA under an Emergency Use Authorization (EUA). This EUA will remain  in effect (meaning this test can be used) for the duration of the COVID-19 declaration under Se ction 564(b)(1) of the Act, 21 U.S.C. section 360bbb-3(b)(1), unless the authorization is terminated or revoked sooner.  Performed at [facility], [place] [address] [place],  [address]       Radiology Studies: No results found.  Scheduled Meds:  acetaminophen (TYLENOL) oral liquid 160 mg/5 mL  650 mg Oral Q6H   morphine CONCENTRATE  5 mg Oral Q4H   Continuous Infusions:   LOS: 1 day   Time spent: 25 minutes   Darliss Cheney, MD [facility]  12/11/2020, 10:46 AM   How to contact the [facility] Attending or Consulting provider [place] or covering provider during after hours [place], for this patient?  Check the care team in [facility] and look for a) attending/consulting TRH provider listed and b) the [facility] team listed. Page or secure chat [phone]. Log into [email or web address] and use [place]'s universal password to access. If you do not have the password, please contact the hospital operator. Locate the [facility] provider you are looking for under [facility] and page to a number that you can be directly reached. If you still have difficulty reaching the provider, please page the [facility]  (Director on Call) for the Hospitalists listed on amion for assistance.

## 2020-12-12 NOTE — Progress Notes (Signed)
PROGRESS NOTE    Richard [name]  FBP:[id number] DOB: [birth year] DOA: 12/10/2020 PCP: Richard Perna, NP   Brief Narrative:  HPI: Richard [name] is a 80 y.o. male with medical history significant of stage III lung cancer not on any treatment,PAF /history of PE, dementia, Covid infection 2021, history of PE, comes to the hospital brought by EMS. Apparently family has been trying to get him into hospice, now unable to take care for him and brought him to the hospital. Apparently when EMS arrived patient was in a poor living condition, matress was soaking with urine. He was brought to the ED. On my evaluation patient is alert, contracted, has underlying confusion and unable to adequately answer any of my questions   Daughter is at bedside, tells me that he has had progressive decline and they were looking to get hospice arranged in their home.  That has not happened and they are having increased difficulties in caring for the patient.   ED Course: in the ED he was found to be tachycardic, temp 99.1, tachypneic. He had an IV placed, received fluids but took the IV out. 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  Palliative on board and managing pain.  Awaiting bed at residential hospice facility.  Continue current management per palliative care.  He is comfortable.  DVT prophylaxis:    Code Status: DNR  Family Communication:  None present at bedside.   Status is: Inpatient  Remains inpatient appropriate because:Unsafe d/c plan  Dispo: The patient is from: Home              Anticipated d/c is to:  [place]              Patient currently is medically stable to d/c.   Difficult to place patient No        Estimated body mass index is 19.07 kg/m as calculated from the  following:   Height as of 05/08/20: 6\' 3"  (1.905 m).   Weight as of 05/19/20: 69.2 kg.  Pressure Injury 05/19/19 Hip Left;Lower Stage II -  Partial thickness loss of dermis presenting as a shallow open ulcer with a red, pink wound bed without slough. Stage II present with scab over it (Active)  05/19/19 0348  Location: Hip  Location Orientation: Left;Lower  Staging: Stage II -  Partial thickness loss of dermis presenting as a shallow open ulcer with a red, pink wound bed without slough.  Wound Description (Comments): Stage II present with scab over it  Present on Admission: Yes     Pressure Injury 05/19/19 Hip Right;Left Stage I -  Intact skin with non-blanchable redness of a localized area usually over a bony prominence. (Active)  05/19/19 0355  Location: Hip  Location Orientation: Right;Left  Staging: Stage I -  Intact skin with non-blanchable redness of a localized area usually over a bony prominence.  Wound Description (Comments):   Present on Admission:      Pressure Injury 12/10/20 Pretibial Left Unstageable - Full thickness tissue loss in which the base of the injury is covered by slough (yellow, tan, gray, green or brown) and/or eschar (tan, brown or black) in the wound bed. deep, yellow, draining (Active)  12/10/20   Location: Pretibial  Location Orientation: Left  Staging:  Unstageable - Full thickness tissue loss in which the base of the injury is covered by slough (yellow, tan, gray, green or brown) and/or eschar (tan, brown or black) in the wound bed.  Wound Description (Comments): deep, yellow, draining  Present on Admission: Yes     Pressure Injury 12/10/20 Ankle Anterior;Right;Medial Unstageable - Full thickness tissue loss in which the base of the injury is covered by slough (yellow, tan, gray, green or brown) and/or eschar (tan, brown or black) in the wound bed. yellow, draining (Active)  12/10/20   Location: Ankle  Location Orientation: Anterior;Right;Medial  Staging:  Unstageable - Full thickness tissue loss in which the base of the injury is covered by slough (yellow, tan, gray, green or brown) and/or eschar (tan, brown or black) in the wound bed.  Wound Description (Comments): yellow, draining  Present on Admission: Yes     Pressure Injury 12/10/20 Buttocks Left Deep Tissue Pressure Injury - Purple or maroon localized area of discolored intact skin or blood-filled blister due to damage of underlying soft tissue from pressure and/or shear. red, non draining (Active)  12/10/20   Location: Buttocks  Location Orientation: Left  Staging: Deep Tissue Pressure Injury - Purple or maroon localized area of discolored intact skin or blood-filled blister due to damage of underlying soft tissue from pressure and/or shear.  Wound Description (Comments): red, non draining  Present on Admission: Yes     Nutritional status:               Consultants:  Palliative care  Procedures:  None  Antimicrobials:  Anti-infectives (From admission, onward)    None          Subjective: Seen and examined.  Alert and partially oriented but comfortable.  Objective: Vitals:   12/10/20 1630 12/10/20 1700 12/10/20 1800 12/10/20 1948  BP: 115/79 121/83 119/76 109/64  Pulse: (!) 129 (!) 129 (!) 127 (!) 126  Resp: (!) 24 (!) 23 (!) 28 18  Temp:    97.6 F (36.4 C)  TempSrc:    Oral  SpO2: 99% 99% 100% 99%   No intake or output data in the 24 hours ending 12/12/20 1413 There were no vitals filed for this visit.  Examination:  General exam: Appears calm and comfortable  Respiratory system: Clear to auscultation. Respiratory effort normal. Cardiovascular system: S1 & S2 heard, RRR. No JVD, murmurs, rubs, gallops or clicks. No pedal edema. Gastrointestinal system: Abdomen is nondistended, soft and nontender. No organomegaly or masses felt. Normal bowel sounds heard. Central nervous system: Alert and partially oriented.   Data Reviewed: I have personally  reviewed following labs and imaging studies  CBC: Recent Labs  Lab 12/10/20 1238  WBC 16.7*  NEUTROABS 13.6*  HGB 14.9  HCT 45.8  MCV 65.0*  PLT 564*    Basic Metabolic Panel: No results for input(s): NA, K, CL, CO2, GLUCOSE, BUN, CREATININE, CALCIUM, MG, PHOS in the last 168 hours. GFR: CrCl cannot be calculated (Patient's most recent lab result is older than the maximum 21 days allowed.). Liver Function Tests: No results for input(s): AST, ALT, ALKPHOS, BILITOT, PROT, ALBUMIN in the last 168 hours. No results for input(s): LIPASE, AMYLASE in the last 168 hours. No results for input(s): AMMONIA in the last 168 hours. Coagulation Profile: No results for input(s): INR, PROTIME in the last 168 hours. Cardiac Enzymes: No results for input(s): CKTOTAL, CKMB, CKMBINDEX, TROPONINI in the last 168 hours. BNP (last 3 results) No results for input(s): PROBNP in the  last 8760 hours. HbA1C: No results for input(s): HGBA1C in the last 72 hours. CBG: No results for input(s): GLUCAP in the last 168 hours. Lipid Profile: No results for input(s): CHOL, HDL, LDLCALC, TRIG, CHOLHDL, LDLDIRECT in the last 72 hours. Thyroid Function Tests: No results for input(s): TSH, T4TOTAL, FREET4, T3FREE, THYROIDAB in the last 72 hours. Anemia Panel: No results for input(s): VITAMINB12, FOLATE, FERRITIN, TIBC, IRON, RETICCTPCT in the last 72 hours. Sepsis Labs: No results for input(s): PROCALCITON, LATICACIDVEN in the last 168 hours.  Recent Results (from the past 240 hour(s))  SARS CORONAVIRUS 2 (TAT 6-24 HRS) Nasopharyngeal Nasopharyngeal Swab     Status: None   Collection Time: 12/10/20 10:56 AM   Specimen: Nasopharyngeal Swab  Result Value Ref Range Status   SARS Coronavirus 2 NEGATIVE NEGATIVE Final    Comment: (NOTE) SARS-CoV-2 target nucleic acids are NOT DETECTED.  The SARS-CoV-2 RNA is generally detectable in upper and lower respiratory specimens during the acute phase of infection.  Negative results do not preclude SARS-CoV-2 infection, do not rule out co-infections with other pathogens, and should not be used as the sole basis for treatment or other patient management decisions. Negative results must be combined with clinical observations, patient history, and epidemiological information. The expected result is Negative.  Fact Sheet for Patients: SugarRoll.be  Fact Sheet for Healthcare Providers: https://www.woods-mathews.com/  This test is not yet approved or cleared by the [place] FDA and  has been authorized for detection and/or diagnosis of SARS-CoV-2 by FDA under an Emergency Use Authorization (EUA). This EUA will remain  in effect (meaning this test can be used) for the duration of the COVID-19 declaration under Se ction 564(b)(1) of the Act, 21 U.S.C. section 360bbb-3(b)(1), unless the authorization is terminated or revoked sooner.  Performed at [facility], [place] [address], [place], [place] [address]        Radiology Studies: No results found.  Scheduled Meds:  acetaminophen (TYLENOL) oral liquid 160 mg/5 mL  650 mg Oral Q6H   morphine CONCENTRATE  5 mg Oral Q4H   Continuous Infusions:   LOS: 2 days   Time spent: 23 minutes   Darliss Cheney, MD [facility]  12/12/2020, 2:13 PM   How to contact the [facility] Attending or Consulting provider [place] or covering provider during after hours [place], for this patient?  Check the care team in [facility] and look for a) attending/consulting TRH provider listed and b) the [facility] team listed. Page or secure chat [id number]. Log into [email or web address] and use [place]'s universal password to access. If you do not have the password, please contact the hospital operator. Locate the [facility] provider you are looking for under [facility] and page to a number that you can be directly reached. If you still have difficulty reaching the provider, please page the [facility]  (Director on Call) for the Hospitalists listed on amion for assistance.

## 2020-12-12 NOTE — Progress Notes (Addendum)
[facility]) [facility] Liaison Note  Visited with patient at bedside, he was alert and verbally responsive but unable to answer questions. He refused anything to drink.  Unfortunately [facility] does not have a room to offer today. Patient is still pending eligibility from Hospice MD, once eligibility has been confirmed, [facility] will update family and hospital and then he can be placed on our waiting list for a room.  Jhonnie Garner, Therapist, sports, BSN, [facility] Liaison  [phone]

## 2020-12-13 NOTE — Discharge Summary (Signed)
Physician Discharge Summary  Richard [name] BWL:[id number] DOB: Dec 05, 1940 DOA: 12/10/2020  PCP: Richard Perna, NP  Admit date: 12/10/2020 Discharge date: 12/13/2020 30 Day Unplanned Readmission Risk Score    Flowsheet Row ED to Hosp-Admission (Current) from 12/10/2020 in [facility]  30 Day Unplanned Readmission Risk Score (%) 21.3 Filed at 12/13/2020 0801       This score is the patient's risk of an unplanned readmission within 30 days of being discharged (0 -100%). The score is based on dignosis, age, lab data, medications, orders, and past utilization.   Low:  0-14.9   Medium: 15-21.9   High: 22-29.9   Extreme: 30 and above           Admitted From: Home Disposition: Residential hospice  Recommendations for Outpatient Follow-up:  Follow up with PCP in 1-2 weeks Please obtain BMP/CBC in one week Please follow up with your PCP on the following pending results: Unresulted Labs (From admission, onward)    None         Home Health: None Equipment/Devices: None  Discharge Condition: Stable CODE STATUS: DNR Diet recommendation: Regular  Subjective: Seen and examined, patient alert and not oriented but pleasant and very comfortable.  Brief/Interim Summary: Richard [name] is a 80 y.o. male with medical history significant of stage III lung cancer not on any treatment,PAF /history of PE, dementia, Covid infection 2021, history of PE, comes to the hospital brought by EMS. Apparently family has been trying to get him into hospice, now unable to take care for him and brought him to the hospital. Apparently when EMS arrived patient was in a poor living condition, matress was soaking with urine. He was brought to the ED. patient was admitted under hospitalist service with comfort measures and plan to discharge to residential hospice.  Palliative care was on board.  Finally he will have a bed today and patient will be discharged later this  evening once his daughter signed the intake paperwork.  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   Eliquis 5 MG Tabs tablet Generic drug: apixaban Take 1 tablet by mouth twice daily   multivitamin with minerals Tabs tablet Take 1 tablet by mouth daily.        Follow-up Information     Richard Perna, NP Follow up in 1 week(s).   Specialty: Internal Medicine Contact information: [place] [id number] [phone]         Richard Pain, MD .   Specialty: Cardiology Contact information: [phone] [address] [place] [address] [phone]                No Known Allergies  Consultations: Palliative care   Procedures/Studies: No results found.   Discharge Exam: Vitals:   12/10/20 1800 12/10/20 1948  BP: 119/76 109/64  Pulse: (!) 127 (!) 126  Resp: (!) 28 18  Temp:  97.6 F (36.4 C)  SpO2: 100% 99%   Vitals:   12/10/20 1630 12/10/20 1700 12/10/20 1800 12/10/20 1948  BP: 115/79 121/83 119/76 109/64  Pulse: (!) 129 (!) 129 (!) 127 (!) 126  Resp: (!) 24 (!) 23 (!) 28 18  Temp:    97.6 F (36.4 C)  TempSrc:    Oral  SpO2: 99% 99% 100% 99%    General: Pt is alert, awake, not in acute distress Cardiovascular: RRR, S1/S2 +, no rubs, no gallops Respiratory: CTA bilaterally, no wheezing, no rhonchi Abdominal: Soft, NT, ND, bowel sounds + Extremities: no edema, no cyanosis    The results of significant diagnostics from this hospitalization (including  imaging, microbiology, ancillary and laboratory) are listed below for reference.     Microbiology: Recent Results (from the past 240 hour(s))  SARS CORONAVIRUS 2 (TAT 6-24 HRS) Nasopharyngeal Nasopharyngeal Swab     Status: None   Collection Time: 12/10/20 10:56 AM   Specimen: Nasopharyngeal Swab  Result Value Ref Range Status   SARS Coronavirus 2 NEGATIVE NEGATIVE Final    Comment: (NOTE) SARS-CoV-2 target nucleic acids are NOT DETECTED.  The SARS-CoV-2 RNA is generally detectable in upper and lower respiratory specimens during the acute phase of infection. Negative results do not preclude SARS-CoV-2 infection, do not rule out co-infections with other pathogens, and should not be used as the sole basis for treatment or other patient management decisions. Negative results must be combined with clinical observations, patient history, and epidemiological information. The expected result is Negative.  Fact Sheet for Patients: SugarRoll.be  Fact Sheet for Healthcare Providers: https://www.woods-mathews.com/  This test is not yet approved or cleared by the [place] FDA and  has been authorized for detection and/or diagnosis of SARS-CoV-2 by FDA under an Emergency Use Authorization (EUA). This EUA will remain  in effect (meaning this test can be used) for the duration of the COVID-19 declaration under Se ction 564(b)(1) of the Act, 21 U.S.C. section 360bbb-3(b)(1), unless the authorization is terminated or revoked sooner.  Performed at [facility], [place] [address], [place],  [address]      Labs: BNP (last 3 results) Recent Labs    01/31/20 2100  BNP 52.7   Basic Metabolic Panel: No results for input(s): NA, K, CL, CO2, GLUCOSE, BUN, CREATININE, CALCIUM, MG, PHOS in the last 168 hours. Liver Function Tests: No results for input(s): AST, ALT, ALKPHOS, BILITOT, PROT, ALBUMIN in the last 168 hours. No results for  input(s): LIPASE, AMYLASE in the last 168 hours. No results for input(s): AMMONIA in the last 168 hours. CBC: Recent Labs  Lab 12/10/20 1238  WBC 16.7*  NEUTROABS 13.6*  HGB 14.9  HCT 45.8  MCV 65.0*  PLT 564*   Cardiac Enzymes: No results for input(s): CKTOTAL, CKMB, CKMBINDEX, TROPONINI in the last 168 hours. BNP: Invalid input(s): POCBNP CBG: No results for input(s): GLUCAP in the last 168 hours. D-Dimer No results for input(s): DDIMER in the last 72 hours. Hgb A1c No results for input(s): HGBA1C in the last 72 hours. Lipid Profile No results for input(s): CHOL, HDL, LDLCALC, TRIG, CHOLHDL, LDLDIRECT in the last 72 hours. Thyroid function studies No results for input(s): TSH, T4TOTAL, T3FREE, THYROIDAB in the last 72 hours.  Invalid input(s): FREET3 Anemia work up No results for input(s): VITAMINB12, FOLATE, FERRITIN, TIBC, IRON, RETICCTPCT in the last 72 hours. Urinalysis    Component Value Date/Time   COLORURINE YELLOW 08/01/2020 0226   APPEARANCEUR CLOUDY (A) 08/01/2020  0226   LABSPEC 1.015 08/01/2020 0226   PHURINE 8.0 08/01/2020 0226   GLUCOSEU NEGATIVE 08/01/2020 0226   HGBUR NEGATIVE 08/01/2020 0226   BILIRUBINUR NEGATIVE 08/01/2020 0226   KETONESUR NEGATIVE 08/01/2020 0226   PROTEINUR 100 (A) 08/01/2020 0226   NITRITE POSITIVE (A) 08/01/2020 0226   LEUKOCYTESUR LARGE (A) 08/01/2020 0226   Sepsis Labs Invalid input(s): PROCALCITONIN,  WBC,  LACTICIDVEN Microbiology Recent Results (from the past 240 hour(s))  SARS CORONAVIRUS 2 (TAT 6-24 HRS) Nasopharyngeal Nasopharyngeal Swab     Status: None   Collection Time: 12/10/20 10:56 AM   Specimen: Nasopharyngeal Swab  Result Value Ref Range Status   SARS Coronavirus 2 NEGATIVE NEGATIVE Final    Comment: (NOTE) SARS-CoV-2 target nucleic acids are NOT DETECTED.  The SARS-CoV-2 RNA is generally detectable in upper and lower respiratory specimens during the acute phase of infection. Negative results do not  preclude SARS-CoV-2 infection, do not rule out co-infections with other pathogens, and should not be used as the sole basis for treatment or other patient management decisions. Negative results must be combined with clinical observations, patient history, and epidemiological information. The expected result is Negative.  Fact Sheet for Patients: SugarRoll.be  Fact Sheet for Healthcare Providers: https://www.woods-mathews.com/  This test is not yet approved or cleared by the [place] FDA and  has been authorized for detection and/or diagnosis of SARS-CoV-2 by FDA under an Emergency Use Authorization (EUA). This EUA will remain  in effect (meaning this test can be used) for the duration of the COVID-19 declaration under Se ction 564(b)(1) of the Act, 21 U.S.C. section 360bbb-3(b)(1), unless the authorization is terminated or revoked sooner.  Performed at [facility], [place] [address] [place], [place] [address]      Time coordinating discharge: Over 30 minutes  SIGNED:   Darliss Cheney, MD  [facility] 12/13/2020, 11:10 AM  If 7PM-7AM, please contact night-coverage [email or web address]

## 2020-12-13 NOTE — TOC Transition Note (Signed)
Transition of Care [facility]) - CM/SW Discharge Note   Patient Details  Name: Taijuan Serviss MRN: [id number] Date of Birth: [birth year]  [facility] CM/SW Contact:  Trish Mage, LCSW Phone Number: 12/13/2020, 2:02 PM   Clinical Narrative:  Patient who has been approved for [facility] can transfer there today. D/C summary faxed per request.  D/C packet completed and unit secretary given instructions to call when verification of signatures on admission forms is received. Nursing, please call report to [phone]. TOC will continue to follow during the course of hospitalization.     Final next level of care: [place] Barriers to Discharge: Barriers Resolved   Patient Goals and CMS Choice        Discharge Placement                       Discharge Plan and Services                                     Social Determinants of Health (SDOH) Interventions     Readmission Risk Interventions Readmission Risk Prevention Plan 05/30/2019  Transportation Screening Complete  Medication Review ([place]) Complete  PCP or Specialist appointment within 3-5 days of discharge Complete  HRI or [place] Complete  SW Recovery Care/Counseling Consult Complete  [place] Not Applicable  Some recent data might be hidden

## 2020-12-13 NOTE — Progress Notes (Signed)
MD order to discharge.  Report called to [facility]. Spoke with RN Newman Nickels [phone].

## 2020-12-13 NOTE — Progress Notes (Signed)
Wound care performed yesterday 12/12/20. Numerous open areas of which some were draining and varied in different stages. Unable to obtain measurements to all wounds as pt was having pain, anxiety, and agitation during wound care.   Left leg lateral below knee 2.4L x 3.3W Left ankle 3.6L x 5W Left heel 1.8L x 2.3W Left lower medial leg 3.8L x4W Left hip 6.8L x 5.5W  x 0.5D Right hip 4L x 3.6W  Pt medicated during procedure but remained moaning, cursing, and combative.   All resolved when care completed.

## 2020-12-13 NOTE — Progress Notes (Signed)
Manufacturing engineer [facility])   Mr. Richard [name] is appropriate to transfer to [facility] and we will have a bed for him once consents are signed which is planned for 5pm   Once completed, please fax dc summary to [phone].   RN staff, you may call report at any time to [phone], room is assigned when report is called. But do not arrange transport until consents are confirmed, we will update.    Please send completed DNR with patient.   Updated attending and [facility] manager via [place].  Thank you for the opportunity to participate in this patient's care.   Jhonnie Garner Therapist, sports, BSN, [facility] Liaison [phone]

## 2021-01-14 DEATH — deceased

## 2021-04-02 IMAGING — CT NUCLEAR MEDICINE PET IMAGE INITIAL (PI) SKULL BASE TO THIGH
8 series · 16 of 16 positions shown · non-contrast
Comparison: Chest CT from 12/14/2018

CLINICAL DATA: Initial treatment strategy for right lung mass.

EXAM:
NUCLEAR MEDICINE PET SKULL BASE TO THIGH
TECHNIQUE: 8.2 mCi F-18 FDG was injected intravenously. Full-ring PET imaging
was performed from the skull base to thigh after the radiotracer. CT
data was obtained and used for attenuation correction and anatomic
localization.
Fasting blood glucose: 97 mg/dl

[Series 3: pet sk_thigh ac · axial · 5.0mm · 4.07mm/px · z∈[-1320,-408]mm · 3 of 229 slices shown]
[im 1/229]
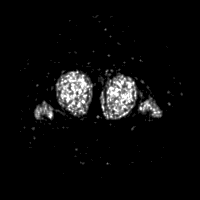
[im 115/229]
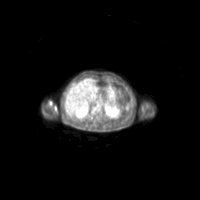
[im 229/229]
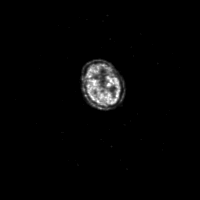

[Series 4: ct sk_thigh 5.0 b31f · axial · 0.98mm/px · z∈[-1320,-408]mm · 3 of 229 slices shown]
[im 1/229  soft-tissue]
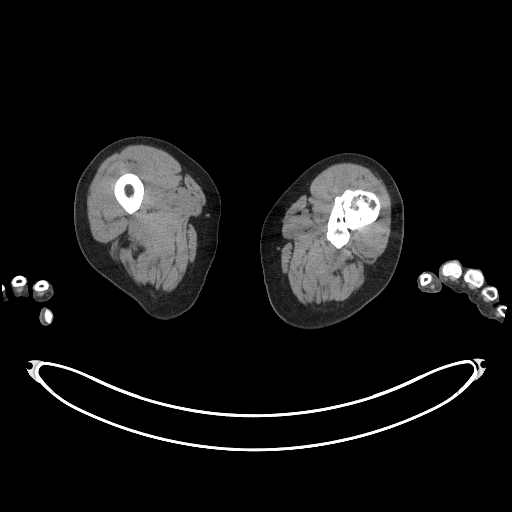
[im 115/229  soft-tissue]
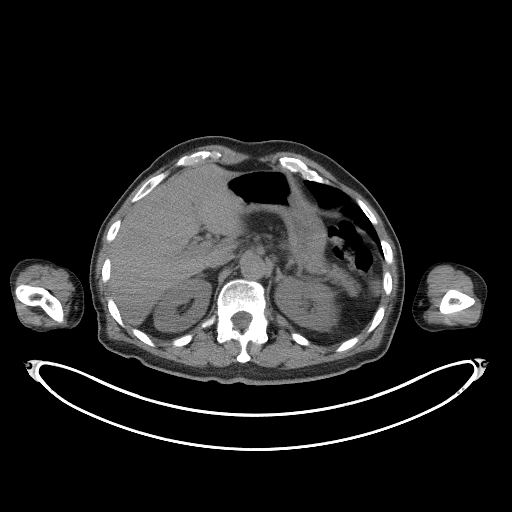
[im 229/229  soft-tissue]
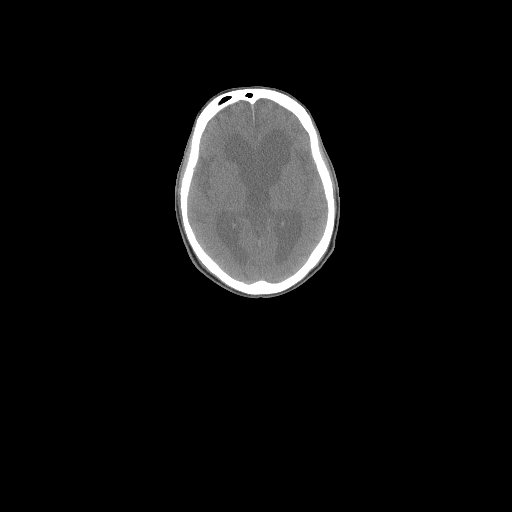

[Series 5: pet sk_thigh nac · axial · 5.0mm · 4.07mm/px · z∈[-1320,-408]mm · 3 of 229 slices shown]
[im 1/229]
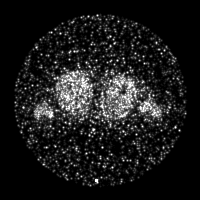
[im 115/229]
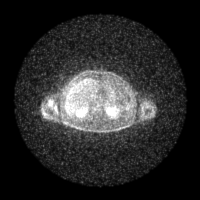
[im 229/229]
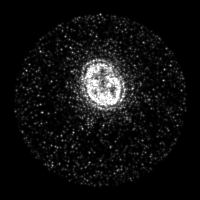

[Series 8: ct sk_thigh 5.0 (id) lung_bone · axial · 0.61mm/px · 1 of 75 slices shown]
[im 1/75  soft-tissue]
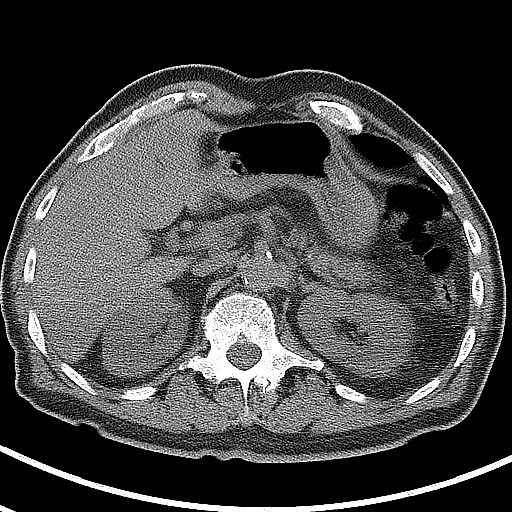

[Series 603: range-ct sk_thigh 5.0 (id)<alpha range> · 1 of 82 slices shown (1 of 2)]
[im 1/82]
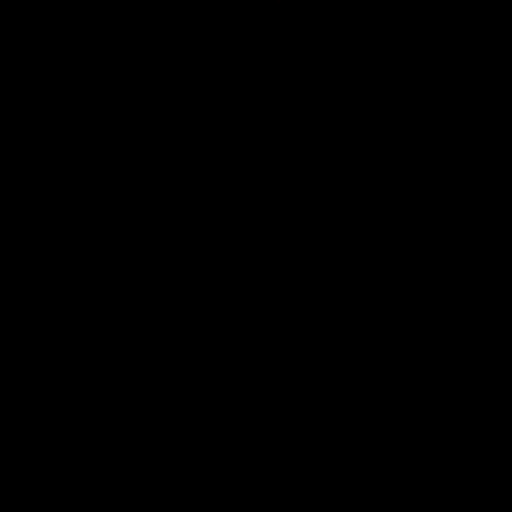

[Series 604: mip range · coronal · 1.89mm/px · 1 of 32 slices shown]
[im 1/32]
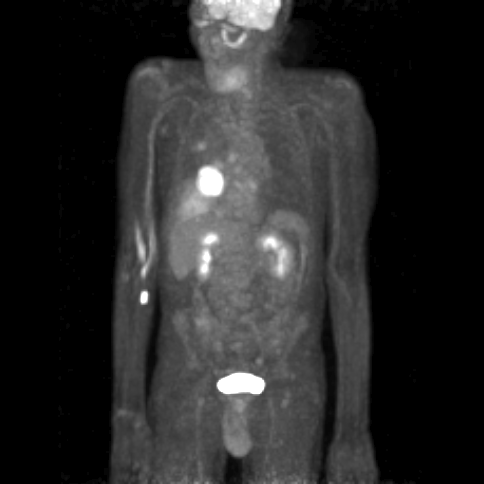

[Series 605: range-ct sk_thigh 5.0 (id)<alpha range> · 3 of 219 slices shown (2 of 2)]
[im 1/219]
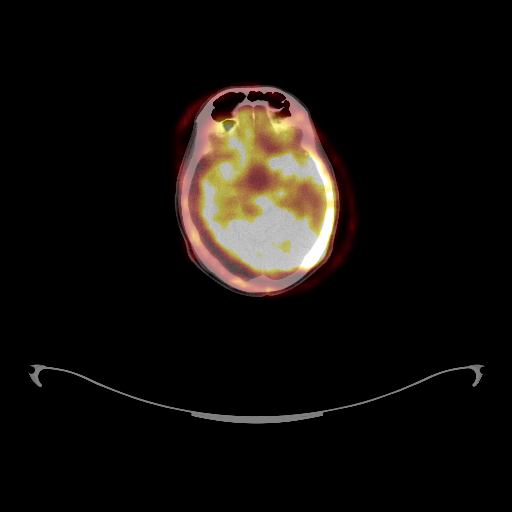
[im 110/219]
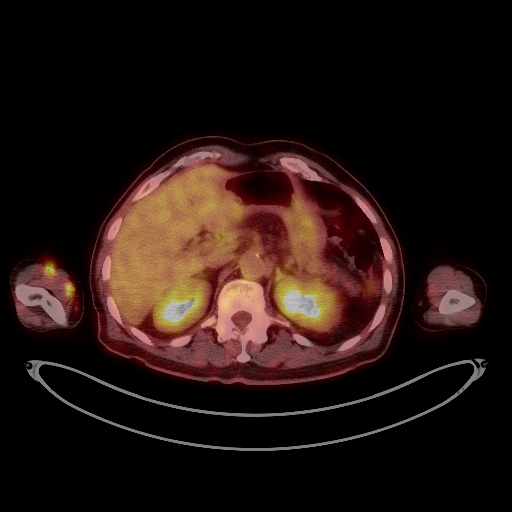
[im 219/219]
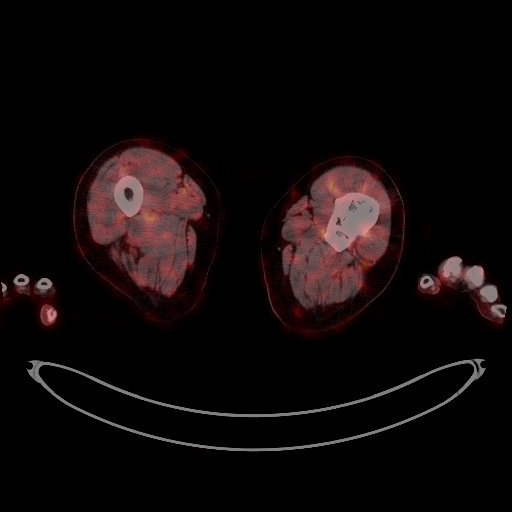

[Series 1062: results mm oncology reading · 5.0mm · 0.75mm/px · 1 of 16 slices shown]
[im 1/16]
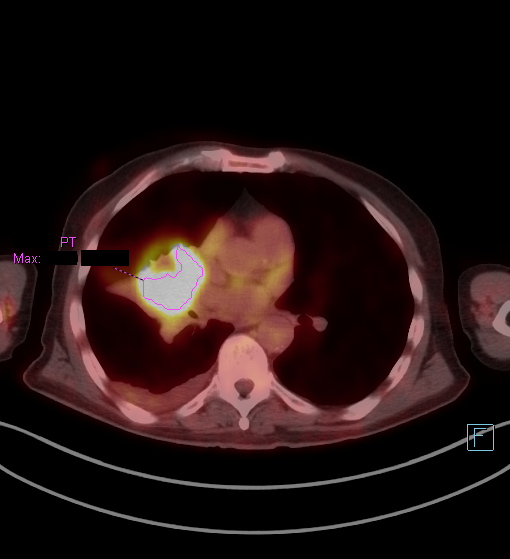

[16 of 16 positions shown; findings below may reference images not displayed]

FINDINGS: Mediastinal blood pool activity: SUV max

Liver activity: SUV max NA

NECK: Unusually prominent but bilaterally symmetric activity in the
salpingopharyngeus muscles bilaterally, probably incidental.

Incidental CT findings: Prominent ventricular system, correlate with
MRI brain.

CHEST: The right hilar mass predominantly seems to have right upper
lobe and perhaps right middle lobe components, has maximum SUV of
28.9. Associated hypermetabolic activity measures 5.3 by 4.9 cm.
There is combination of volume loss and drowned lung appearance in
the right middle lobe with generalized accentuated metabolic
activity maximum SUV of about 7.7. A small right pleural effusion
has low-grade metabolic activity, representative internal SUV 2.4.

Right lower paratracheal lymph node at the level of the carina
cm in short axis, maximum SUV 4.9. A subcarinal node measuring
cm in short axis on image 82/4 has maximum SUV of 4.5.

Incidental CT findings: Centrilobular emphysema. 5 mm left lower
lobe nodule in the posterior basal segment is not perceptibly
hypermetabolic but is below sensitive PET-CT size thresholds.

ABDOMEN/PELVIS: In upper portion of the right hepatic lobe, a small
focus of accentuated metabolic activity is present, maximum SUV of
4.9, background liver activity has a maximum SUV of about 4.0.

Incidental CT findings: Aortoiliac atherosclerotic vascular disease.

SKELETON: In the vicinity of the inferior tip of the right scapula
there is a small focus of accentuated metabolic activity, maximum
SUV 5.0. I do not see an obvious underlying bony or soft tissue
abnormality in the scapular tip or adjacent musculature. It could be
that this is simply physiologic adjacent muscular activity but
attention to this region is recommended.

There is activity in the right antecubital fossa and adjacent
vasculature likely related to small extravasation of
radiopharmaceutical during injection.

Incidental CT findings: Left total hip prosthesis.
IMPRESSION: 1. Approximately 5.3 cm right hilar mass with upper lobe and likely
middle lobe involvement, maximum SUV 28.9, compatible with
malignancy. There is a mildly hypermetabolic right lower
paratracheal lymph node and mild hypermetabolic activity in a small
subcarinal lymph node.
2. Small right pleural effusion with low-grade metabolic activity,
representative internal SUV 2.4. This is indeterminate for
malignancy.
3. The 5 mm left lower lobe nodule in the posterior basal segment is
not perceptibly hypermetabolic but is below sensitive PET-CT size
thresholds.
4. In the upper portion of the right hepatic lobe there is a small
focus of metabolic activity, maximum SUV of 4.9 compared to
background liver activity maximum SUV of 4.0. The appearance raises
suspicion for a small metastatic lesion. This could be confirmed by
hepatic protocol MRI with and without contrast, if clinically
warranted.
5. Small focus of activity near the inferior tip of the right
scapula. No corresponding bony or soft tissue lesion is perceived on
the CT data. This may simply be physiologic activity adjacent to the
scapular tip but the area merits surveillance on follow up chest CT
examinations.
6. Combination of volume loss and drowned lung appearance of the
right middle lobe which has diffuse moderate activity which is
likely inflammatory.
7. Other imaging findings of potential clinical significance: Aortic
Atherosclerosis (OO5SN-Q04.4) and Emphysema (OO5SN-L37.C). Left
total hip prosthesis.

## 2023-05-06 NOTE — Telephone Encounter (Signed)
Telephone call
